# Patient Record
Sex: Female | Born: 1965 | Race: Black or African American | Hispanic: No | Marital: Married | Smoking: Former smoker
Health system: Southern US, Community
[De-identification: ages and names within clinical notes are randomized; demographics above are authoritative.]

## PROBLEM LIST (undated history)

## (undated) DIAGNOSIS — N390 Urinary tract infection, site not specified: Secondary | ICD-10-CM

## (undated) DIAGNOSIS — J329 Chronic sinusitis, unspecified: Secondary | ICD-10-CM

## (undated) DIAGNOSIS — T7840XA Allergy, unspecified, initial encounter: Secondary | ICD-10-CM

## (undated) DIAGNOSIS — F329 Major depressive disorder, single episode, unspecified: Secondary | ICD-10-CM

## (undated) DIAGNOSIS — K449 Diaphragmatic hernia without obstruction or gangrene: Secondary | ICD-10-CM

## (undated) DIAGNOSIS — G8929 Other chronic pain: Secondary | ICD-10-CM

## (undated) DIAGNOSIS — L4050 Arthropathic psoriasis, unspecified: Secondary | ICD-10-CM

## (undated) DIAGNOSIS — R011 Cardiac murmur, unspecified: Secondary | ICD-10-CM

## (undated) DIAGNOSIS — N62 Hypertrophy of breast: Secondary | ICD-10-CM

## (undated) DIAGNOSIS — A630 Anogenital (venereal) warts: Secondary | ICD-10-CM

## (undated) DIAGNOSIS — N189 Chronic kidney disease, unspecified: Secondary | ICD-10-CM

## (undated) DIAGNOSIS — U071 COVID-19: Secondary | ICD-10-CM

## (undated) DIAGNOSIS — F32A Depression, unspecified: Secondary | ICD-10-CM

## (undated) DIAGNOSIS — B029 Zoster without complications: Secondary | ICD-10-CM

## (undated) DIAGNOSIS — F419 Anxiety disorder, unspecified: Secondary | ICD-10-CM

## (undated) HISTORY — DX: COVID-19: U07.1

## (undated) HISTORY — DX: Chronic kidney disease, unspecified: N18.9

## (undated) HISTORY — DX: Arthropathic psoriasis, unspecified: L40.50

## (undated) HISTORY — DX: Anogenital (venereal) warts: A63.0

## (undated) HISTORY — DX: Depression, unspecified: F32.A

## (undated) HISTORY — DX: Diaphragmatic hernia without obstruction or gangrene: K44.9

## (undated) HISTORY — DX: Chronic sinusitis, unspecified: J32.9

## (undated) HISTORY — DX: Urinary tract infection, site not specified: N39.0

## (undated) HISTORY — DX: Allergy, unspecified, initial encounter: T78.40XA

## (undated) HISTORY — DX: Zoster without complications: B02.9

## (undated) HISTORY — DX: Major depressive disorder, single episode, unspecified: F32.9

## (undated) HISTORY — DX: Other chronic pain: G89.29

## (undated) HISTORY — PX: GASTRIC BYPASS: SHX52

## (undated) HISTORY — DX: Cardiac murmur, unspecified: R01.1

## (undated) HISTORY — PX: REDUCTION MAMMAPLASTY: SUR839

---

## 1995-09-02 HISTORY — PX: TUBAL LIGATION: SHX77

## 2006-09-01 HISTORY — PX: LAPAROSCOPIC GASTRIC BANDING: SHX1100

## 2007-08-17 ENCOUNTER — Encounter: Payer: Self-pay | Admitting: Family Medicine

## 2007-09-02 HISTORY — PX: CHOLECYSTECTOMY: SHX55

## 2008-04-25 ENCOUNTER — Ambulatory Visit: Payer: Self-pay | Admitting: Obstetrics & Gynecology

## 2008-04-25 ENCOUNTER — Encounter: Payer: Self-pay | Admitting: Obstetrics & Gynecology

## 2008-05-17 ENCOUNTER — Ambulatory Visit: Payer: Self-pay | Admitting: Family Medicine

## 2008-05-17 DIAGNOSIS — G43109 Migraine with aura, not intractable, without status migrainosus: Secondary | ICD-10-CM | POA: Insufficient documentation

## 2008-05-17 DIAGNOSIS — F4323 Adjustment disorder with mixed anxiety and depressed mood: Secondary | ICD-10-CM | POA: Insufficient documentation

## 2008-05-17 DIAGNOSIS — S6390XA Sprain of unspecified part of unspecified wrist and hand, initial encounter: Secondary | ICD-10-CM | POA: Insufficient documentation

## 2008-07-12 ENCOUNTER — Ambulatory Visit: Payer: Self-pay | Admitting: Family Medicine

## 2008-07-12 LAB — CONVERTED CEMR LAB: Rapid Strep: NEGATIVE

## 2009-06-01 ENCOUNTER — Ambulatory Visit: Payer: Self-pay | Admitting: Family Medicine

## 2009-06-04 ENCOUNTER — Telehealth: Payer: Self-pay | Admitting: Family Medicine

## 2009-06-20 ENCOUNTER — Ambulatory Visit: Payer: Self-pay | Admitting: Family Medicine

## 2009-08-14 ENCOUNTER — Ambulatory Visit: Payer: Self-pay | Admitting: Family Medicine

## 2009-08-14 ENCOUNTER — Telehealth (INDEPENDENT_AMBULATORY_CARE_PROVIDER_SITE_OTHER): Payer: Self-pay | Admitting: *Deleted

## 2009-09-17 ENCOUNTER — Ambulatory Visit: Payer: Self-pay | Admitting: Family Medicine

## 2009-09-17 ENCOUNTER — Other Ambulatory Visit: Admission: RE | Admit: 2009-09-17 | Discharge: 2009-09-17 | Payer: Self-pay | Admitting: Family Medicine

## 2009-09-17 DIAGNOSIS — R5381 Other malaise: Secondary | ICD-10-CM | POA: Insufficient documentation

## 2009-09-17 DIAGNOSIS — R5383 Other fatigue: Secondary | ICD-10-CM

## 2009-09-18 ENCOUNTER — Ambulatory Visit: Payer: Self-pay | Admitting: Family Medicine

## 2009-09-18 ENCOUNTER — Encounter: Payer: Self-pay | Admitting: Family Medicine

## 2009-09-19 LAB — CONVERTED CEMR LAB
Albumin: 3.8 g/dL (ref 3.5–5.2)
Basophils Absolute: 0 10*3/uL (ref 0.0–0.1)
Basophils Relative: 0.2 % (ref 0.0–3.0)
CO2: 24 meq/L (ref 19–32)
Chloride: 109 meq/L (ref 96–112)
Creatinine, Ser: 0.9 mg/dL (ref 0.4–1.2)
Eosinophils Absolute: 0.2 10*3/uL (ref 0.0–0.7)
Folate: 5 ng/mL
Glucose, Bld: 84 mg/dL (ref 70–99)
Hemoglobin: 11.8 g/dL — ABNORMAL LOW (ref 12.0–15.0)
MCHC: 32 g/dL (ref 30.0–36.0)
MCV: 85.9 fL (ref 78.0–100.0)
Monocytes Absolute: 0.5 10*3/uL (ref 0.1–1.0)
Neutro Abs: 6.6 10*3/uL (ref 1.4–7.7)
RBC: 4.31 M/uL (ref 3.87–5.11)
RDW: 12.7 % (ref 11.5–14.6)
TSH: 1.03 microintl units/mL (ref 0.35–5.50)
Total Protein: 6.4 g/dL (ref 6.0–8.3)
Vitamin B-12: 372 pg/mL (ref 211–911)

## 2009-12-19 ENCOUNTER — Telehealth: Payer: Self-pay | Admitting: Family Medicine

## 2009-12-20 ENCOUNTER — Ambulatory Visit: Payer: Self-pay | Admitting: Family Medicine

## 2009-12-20 DIAGNOSIS — B353 Tinea pedis: Secondary | ICD-10-CM | POA: Insufficient documentation

## 2010-01-03 ENCOUNTER — Ambulatory Visit: Payer: Self-pay | Admitting: Family Medicine

## 2010-01-07 LAB — CONVERTED CEMR LAB
ALT: 13 units/L (ref 0–35)
AST: 21 units/L (ref 0–37)
Alkaline Phosphatase: 60 units/L (ref 39–117)
Bilirubin, Direct: 0 mg/dL (ref 0.0–0.3)
Total Bilirubin: 0.3 mg/dL (ref 0.3–1.2)

## 2010-01-18 ENCOUNTER — Ambulatory Visit: Payer: Self-pay | Admitting: Family Medicine

## 2010-03-15 ENCOUNTER — Ambulatory Visit: Payer: Self-pay | Admitting: Family Medicine

## 2010-03-15 DIAGNOSIS — J309 Allergic rhinitis, unspecified: Secondary | ICD-10-CM | POA: Insufficient documentation

## 2010-09-03 ENCOUNTER — Ambulatory Visit
Admission: RE | Admit: 2010-09-03 | Discharge: 2010-09-03 | Payer: Self-pay | Source: Home / Self Care | Attending: Internal Medicine | Admitting: Internal Medicine

## 2010-09-03 DIAGNOSIS — E049 Nontoxic goiter, unspecified: Secondary | ICD-10-CM | POA: Insufficient documentation

## 2010-09-27 ENCOUNTER — Encounter: Payer: Self-pay | Admitting: Family Medicine

## 2010-09-27 ENCOUNTER — Ambulatory Visit
Admission: RE | Admit: 2010-09-27 | Discharge: 2010-09-27 | Payer: Self-pay | Source: Home / Self Care | Attending: Family Medicine | Admitting: Family Medicine

## 2010-09-27 DIAGNOSIS — T7802XA Anaphylactic reaction due to shellfish (crustaceans), initial encounter: Secondary | ICD-10-CM | POA: Insufficient documentation

## 2010-09-27 DIAGNOSIS — J019 Acute sinusitis, unspecified: Secondary | ICD-10-CM | POA: Insufficient documentation

## 2010-10-01 NOTE — Assessment & Plan Note (Signed)
Summary: SORE THROAT/HEADACHE/CONGESTION/RBH   Vital Signs:  Patient profile:   45 year old female Height:      63 inches Weight:      244 pounds BMI:     43.38 Temp:     98.3 degrees F oral Pulse rate:   68 / minute Pulse rhythm:   regular BP sitting:   130 / 82  (left arm) Cuff size:   regular  Vitals Entered By: Linde Gillis CMA Duncan Dull) (Jan 18, 2010 9:39 AM) CC: sore throat, headache, congestion   History of Present Illness: 45 yo with 1 1/2 week of worsening sinus congestion, sore throat. Started out with runny nose, felt a little better a few days later. Last several days, felt feverish with increased sinus pressure. No cough. No SOB, no wheezing. No n/v/d. Taking mucinex OTC with no relief of symptoms.  Current Medications (verified): 1)  Multivitamins   Tabs (Multiple Vitamin) .... Take 1 Tablet By Mouth Once A Day 2)  Allegra-D 24 Hour 180-240 Mg Xr24h-Tab (Fexofenadine-Pseudoephedrine) .[name].. 1 Tab By Mouth Daily. 3)  Azithromycin 250 Mg  Tabs (Azithromycin) .... 2 By  Mouth Today and Then 1 Daily For 4 Days  Allergies (verified): No Known Drug Allergies  Review of Systems      See HPI General:  Complains of chills and fever. ENT:  Complains of nasal congestion, sinus pressure, and sore throat. Resp:  Denies cough, sputum productive, and wheezing. GI:  Denies diarrhea, nausea, and vomiting.  Physical Exam  General:  obese appearing female in NAD, very pleasant. Ears:  TMs retracted bilaterally. Nose:  nasal discharge,mucosal pallor. Frontal sinuses TTP. Mouth:  pharyngeal erythema.   no exudates Lungs:  Normal respiratory effort, chest expands symmetrically. Lungs are clear to auscultation, no crackles or wheezes. Heart:  Normal rate and regular rhythm. S1 and S2 normal without gallop, murmur, click, rub or other extra sounds. Extremities:  no edema Psych:  talkative, pleasant, normal affect   Impression & Recommendations:  Problem # 1:  SINUSITIS,  ACUTE (ICD-461.9) Assessment New Given duration and progression of symptoms, will treat for bacterial sinusitis. See pt instructions for details. Her updated medication list for this problem includes:    Allegra-d 24 Hour 180-240 Mg Xr24h-tab (Fexofenadine-pseudoephedrine) .[name]... 1 tab by mouth daily.    Azithromycin 250 Mg Tabs (Azithromycin) .[name]... 2 by  mouth today and then 1 daily for 4 days  Complete Medication List: 1)  Multivitamins Tabs (Multiple vitamin) .... Take 1 tablet by mouth once a day 2)  Allegra-d 24 Hour 180-240 Mg Xr24h-tab (Fexofenadine-pseudoephedrine) .[name].. 1 tab by mouth daily. 3)  Azithromycin 250 Mg Tabs (Azithromycin) .... 2 by  mouth today and then 1 daily for 4 days  Patient Instructions: 1)  Take Zpack as directed.  Drink lots of fluids.  Treat sympotmatically with Mucinex, nasal saline irrigation, and Tylenol/Ibuprofen. Also try claritin D or zyrtec D over the counter- two times a day as needed ( have to sign for them at pharmacy). You can use warm compresses.  Cough suppressant at night. Call if not improving as expected in 5-7 days.  Prescriptions: AZITHROMYCIN 250 MG  TABS (AZITHROMYCIN) 2 by  mouth today and then 1 daily for 4 days  #6 x 0   Entered and Authorized by:   Ruthe Mannan MD   Signed by:   Ruthe Mannan MD on 01/18/2010   Method used:   Electronically to        [facility]. [facility]. #  [id number]* (retail)       [address] [place]  [address]       Ph: [phone]       Fax: [phone]   RxID:   [id number]   Current Allergies (reviewed today): No known allergies

## 2010-10-01 NOTE — Assessment & Plan Note (Signed)
Summary: CPX/CLE   Vital Signs:  Patient profile:   45 year old female LMP:     09/03/2009 Weight:      260.25 pounds BMI:     46.27 Temp:     98.4 degrees F oral Pulse rate:   88 / minute Pulse rhythm:   regular BP sitting:   122 / 76  (left arm) Cuff size:   large  Vitals Entered By: Delilah Shan CMA [facility] Dull) (September 17, 2009 9:10 AM) CC: CPX LMP (date): 09/03/2009     Enter LMP: 09/03/2009   History of Present Illness: 45 yo here for CPX with complaint of fatigue.  Faitigue- noticed it since the holidays were over. She's not sure if she's just finally having time to rest and that's why she is tired or if something else is going on.  No hot/cold intolerance, no weight changes, no night sweats, no fevers.  No SOB, no chest pain.  Denies any signs or symptoms of depression or anxiety.  Younger son died years ago and she went to counselling for depression at that time but is much better now.   NO DOE.  Well woman- G2P2 s/p BTL.  No h/o abnormal pap smears.  No vaginal discharge.  Due for mammogram, FLP.  No FH of cervical, breast, or uterine CA.  No FH of HLD or CAD.  Current Medications (verified): 1)  Multivitamins   Tabs (Multiple Vitamin) .... Take 1 Tablet By Mouth Once A Day  Allergies (verified): No Known Drug Allergies  Family History: Reviewed history from 05/17/2008 and no changes required. Family History Lung cancer Father, d/c HIV Mom - HTN, chol  2 brothers  1 son  Social History: Reviewed history from 05/17/2008 and no changes required. 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 Eyes:  Denies blurring. ENT:  Denies difficulty swallowing. CV:  Denies chest pain or discomfort. Resp:  Denies shortness of breath.  Physical  Exam  General:  obese appearing female in NAD, very pleasant. Ears:  External ear exam shows no significant lesions or deformities.  Otoscopic examination reveals clear canals, tympanic membranes are intact bilaterally without bulging, retraction, inflammation or discharge. Hearing is grossly normal bilaterally. Mouth:  Oral mucosa and oropharynx without lesions or exudates.  Teeth in good repair. Breasts:  No mass, nodules, thickening, tenderness, bulging, retraction, inflamation, nipple discharge or skin changes noted.   Lungs:  Normal respiratory effort, chest expands symmetrically. Lungs are clear to auscultation, no crackles or wheezes. Heart:  Normal rate and regular rhythm. S1 and S2 normal without gallop, murmur, click, rub or other extra sounds. Abdomen:  obese, soft, NT Genitalia:  Pelvic Exam:        External: normal female genitalia without lesions or masses        Vagina: normal without lesions or masses        Cervix: normal without lesions or masses        Adnexa: normal bimanual exam without masses or fullness        Uterus: normal by palpation        Pap smear: performed Extremities:  no edema Skin:  Intact without suspicious lesions or rashes Psych:  talkative, pleasant, normal affect   Impression & Recommendations:  Problem # 1:  Preventive Health Care (ICD-V70.0) Reviewed preventive care protocols, scheduled due services, and updated immunizations Discussed nutrition, exercise, diet, and healthy lifestyle.  Pap today, set up mammogram, FLP, BMET today.  Problem # 2:  SCREENING FOR MALIGNANT NEOPLASM, CERVIX (ICD-V76.2)  Orders: Pap Smear, Thin Prep ( Collection of) (L2440)  Problem # 3:  FATIGUE (ICD-780.79) Assessment: New Likely due to life stressors, but will check labs to rule out reversible causes.   No signs or symptoms of cardiac source. Orders: TLB-BMP (Basic Metabolic Panel-BMET) (80048-METABOL) TLB-CBC Platelet - w/Differential  (85025-CBCD) TLB-TSH (Thyroid Stimulating Hormone) (84443-TSH) TLB-B12, Serum-Total ONLY (10272-Z36) TLB-Folic Acid (Folate) (82746-FOL)  Complete Medication List: 1)  Multivitamins Tabs (Multiple vitamin) .... Take 1 tablet by mouth once a day  Other Orders: Venipuncture (64403) TLB-Hepatic/Liver Function Pnl (80076-HEPATIC) TLB-Lipid Panel (80061-LIPID) Radiology Referral (Radiology)  Patient Instructions: 1)  Great to see you. 2)  Please stop by to see Shirlee Limerick on your way out. 3)  Have a good week.  Current Allergies (reviewed today): No known allergies

## 2010-10-01 NOTE — Assessment & Plan Note (Signed)
Summary: ? FOOT FUNGUS/NT   Vital Signs:  Patient profile:   45 year old female Height:      63 inches Weight:      245 pounds BMI:     43.56 Temp:     98.5 degrees F oral Pulse rate:   80 / minute Pulse rhythm:   regular BP sitting:   132 / 84  (left arm) Cuff size:   large  Vitals Entered By: Linde Gillis CMA Duncan Dull) (December 20, 2009 10:48 AM) CC: ? foot fungus   History of Present Illness: 45 yo here for foot fungus.  Noticed since she started exercising more that bottom of feet were burning. Two weeks ago, noticed some red, peeling between her toes and on bottom of feet.  Tried OTC tenactin and Lamisil with no relief of symptoms. Never had anything like this before.   Current Medications (verified): 1)  Multivitamins   Tabs (Multiple Vitamin) .... Take 1 Tablet By Mouth Once A Day 2)  Lipo 6 .... Take One Tablet By Mouth Twice Daily 3)  Terbinafine Hcl 250 Mg Tabs (Terbinafine Hcl) .[name].. 1 Tab By Mouth Daily X 2 Weeks. 4)  Allegra-D 24 Hour 180-240 Mg Xr24h-Tab (Fexofenadine-Pseudoephedrine) .[name].. 1 Tab By Mouth Daily.  Allergies (verified): No Known Drug Allergies  Review of Systems      See HPI General:  Denies fever. GI:  Denies abdominal pain, nausea, and vomiting. Derm:  Complains of dryness, itching, and rash.  Physical Exam  General:  obese appearing female in NAD, very pleasant. Skin:  erythematous, peeling skin in between toes and bottoms of both feet, no open wounds Psych:  talkative, pleasant, normal affect   Impression & Recommendations:  Problem # 1:  DERMATOPHYTOSIS OF FOOT (ICD-110.4) Assessment New s/p failed topical treatments. Given widespread nature and that she has failed topical treatments, will try 2 week course of Terbinafine 250 mg daily. LFTs were normal in 09/2009.  Return in 2 weeks to recheck LFTs and re evaluate feet. Her updated medication list for this problem includes:    Terbinafine Hcl 250 Mg Tabs (Terbinafine hcl) .[name]... 1 tab  by mouth daily x 2 weeks.  Complete Medication List: 1)  Multivitamins Tabs (Multiple vitamin) .... Take 1 tablet by mouth once a day 2)  Lipo 6  .... Take one tablet by mouth twice daily 3)  Terbinafine Hcl 250 Mg Tabs (Terbinafine hcl) .[name].. 1 tab by mouth daily x 2 weeks. 4)  Allegra-d 24 Hour 180-240 Mg Xr24h-tab (Fexofenadine-pseudoephedrine) .[name].. 1 tab by mouth daily.  Patient Instructions: 1)  Great to see you! 2)  Please make a lab appointment on your way out for 2 weeks from now to check your liver function. 3)  Have a great Easter. 4)  I am so proud of you! Prescriptions: ALLEGRA-D 24 HOUR 180-240 MG XR24H-TAB (FEXOFENADINE-PSEUDOEPHEDRINE) 1 tab by mouth daily.  #60 x 3   Entered and Authorized by:   Ruthe Mannan MD   Signed by:   Ruthe Mannan MD on 12/20/2009   Method used:   Electronically to        [facility]. [address] [phone]* (retail)       [address] [place], [place]  [address]       Ph: [phone]       Fax: [phone]   RxID:   [id number] TERBINAFINE HCL 250 MG TABS (TERBINAFINE HCL) 1 tab by mouth daily x 2 weeks.  #  14 x 0   Entered and Authorized by:   Ruthe Mannan MD   Signed by:   Ruthe Mannan MD on 12/20/2009   Method used:   Electronically to        [facility]. [address] [phone]* (retail)       [address] [place]  [address]       Ph: [phone]       Fax: [phone]   RxID:   [id number]   Current Allergies (reviewed today): No known allergies

## 2010-10-01 NOTE — Assessment & Plan Note (Signed)
Summary: SORE THROAT AND HEADACHE CYD   Vital Signs:  Patient profile:   45 year old female Height:      63 inches Weight:      247.25 pounds Temp:     98.5 degrees F oral Pulse rate:   84 / minute Pulse rhythm:   regular BP sitting:   130 / 84  (right arm) Cuff size:   large   Acute Visit History:      The patient complains of earache, headache, nasal discharge, and sore throat.  These symptoms began 2 days ago.  Other comments include: Took some allegra D. .        The earache is located on the right side.        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 Take 1 Tablet By Mouth Once A Day 2)  Allegra-D 24 Hour 180-240 Mg Xr24h-Tab (Fexofenadine-Pseudoephedrine) .[name].. 1 Tab By Mouth Daily.  Allergies (verified): No Known Drug Allergies  Past History:  Past medical, surgical, family and social histories (including risk factors) reviewed, and no changes noted (except as noted below).  Past Medical History: Reviewed history from 05/17/2008 and no changes required. Allergies Depression, took Paxil. Son died 4 years ago. Migraines  Past Surgical History: Reviewed history from 05/17/2008 and no changes required. Cholecystectomy, 2009, [place] Lap Band, 2008 Lap Band Removal, 2009 Tubal ligation, 1997  Family History: Reviewed history from 05/17/2008 and no changes required. Family History Lung cancer Father, d/c HIV Mom - HTN,  chol  2 brothers  1 son  Social History: Reviewed history from 05/17/2008 and no changes required. 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 CV:  Denies chest pain or discomfort. Resp:  Denies shortness of breath.  Physical Exam  General:  Well-developed,well-nourished,in no acute distress; alert,appropriate and cooperative throughout examination Head:  no maxillary sinus ttp Ears:  clear fluid B TMs Nose:  nasal turbinate pallor Mouth:  Oral mucosa and oropharynx without lesions or exudates.  Teeth in good repair. Neck:  no carotid bruit or thyromegaly no cervical or supraclavicular lymphadenopathy  Lungs:  Normal respiratory effort, chest expands symmetrically. Lungs are clear to auscultation, no crackles or wheezes. Heart:  Normal rate and regular rhythm. S1 and S2 normal without gallop, murmur, click, rub or other extra sounds.   Impression & Recommendations:  Problem # 1:  URI (ICD-465.9)  Her updated medication list for this problem includes:    Allegra-d 24 Hour 180-240 Mg Xr24h-tab (Fexofenadine-pseudoephedrine) .[name]... 1 tab by mouth daily.  Instructed on symptomatic treatment. Call if symptoms persist or worsen.   Problem # 2:  ALLERGIC RHINITIS (ICD-477.9) Add nasal steroid.Marland Kitchenwill likely help with fluid in ears and eustacian tube dysfunction.  Her updated medication list for this problem includes:    Fluticasone Propionate 50 Mcg/act Susp (Fluticasone propionate) .Marland KitchenMarland KitchenMarland KitchenMarland Kitchen 2  sprays per nostril daily  Complete Medication List: 1)  Multivitamins Tabs (Multiple vitamin) .... Take 1 tablet by mouth once a day 2)  Allegra-d 24 Hour 180-240 Mg Xr24h-tab (Fexofenadine-pseudoephedrine) .[name].. 1 tab by mouth daily. 3)  Fluticasone Propionate 50 Mcg/act Susp (Fluticasone propionate) .... 2 sprays per nostril daily  Patient Instructions: 1)   Guafenesin two times a day for cough and congestion. 2)  Tylenol for ear pain. 3)   Start nasal steroid spray. 4)   Continue allegra D.  Prescriptions: FLUTICASONE PROPIONATE 50 MCG/ACT SUSP (FLUTICASONE PROPIONATE) 2 sprays per nostril daily  #1 x 0   Entered and Authorized by:   Kerby Nora MD   Signed by:   Kerby Nora MD on 03/15/2010   Method used:   Electronically to        [facility] [address] [phone]* (retail)       [address] [place]  [address]       Ph: [phone]       Fax: [phone]   RxID:   [id number]   Current Allergies (reviewed today): No known allergies

## 2010-10-01 NOTE — Progress Notes (Signed)
Summary: Foot pain  Phone Note Call from Patient Call back at [phone]   Caller: Patient Call For: Dr. Dayton Martes Summary of Call: Patient thinks she may have a foot fungus.  Right foot pain in between her little toe, no redness, no swelling.  This has been going on for about two weeks.  Has used Tenactin spray and no relief.  Uses [facility] Initial call taken by: Linde Gillis CMA Duncan Dull),  December 19, 2009 9:47 AM  Follow-up for Phone Call        We can't prescibe oral antifungals without seeing her first.  Needs to make appt tomorrow or next week. Follow-up by: Ruthe Mannan MD,  December 19, 2009 9:52 AM  Additional Follow-up for Phone Call Additional follow up Details #1::        Patient advised as instructed.  Made her an appt for tomorrow at 10:45.  Additional Follow-up by: Linde Gillis CMA Duncan Dull),  December 19, 2009 10:06 AM

## 2010-10-03 NOTE — Assessment & Plan Note (Signed)
Summary: ST,HA/CLE   Vital Signs:  Patient profile:   45 year old female Weight:      253.25 pounds Temp:     98.7 degrees F oral Pulse rate:   88 / minute Pulse rhythm:   regular BP sitting:   128 / 82  (left arm) Cuff size:   large  Vitals Entered By: Selena Batten Dance CMA (AAMA) (September 03, 2010 9:20 AM) CC: Sore throat, headache, sinus symptoms x1 1/2 weeks  Comments Mucinex, Nyquil and Dayquil no help   History of Present Illness: CC: HA, ST  1 1/2 wk h/o HA as well as ST.  Cough started 3 days ago, productive of yellow sputum.  + hoarse voice throughout.  Very congested in head.  + yellow nasal discharge.  Nauseated x 2 days.  Vomited x 3.  Tried mucinex, dayquil and nyquil, motrin.  HA described as starting behind L eye then radiates to entire head, constant.  sharp.  h/o migraines, this is different from migraines.  Seems more intense than sinus pressure pain.  No fevers/chills, diarrhea, malgias/arthralgias, rashes.  No sick contacts at home.  No smokers at home.  No h/o asthma.  2. thyroid - no h/o thyroid issues, no fmhx thyroid issues (CA or otherwise).  has put on weight recently.   Current Medications (verified): 1)  Multivitamins   Tabs (Multiple Vitamin) .... Take 1 Tablet By Mouth Once A Day 2)  Allegra-D 24 Hour 180-240 Mg Xr24h-Tab (Fexofenadine-Pseudoephedrine) .[name].. 1 Tab By Mouth Daily. 3)  Fluticasone Propionate 50 Mcg/act Susp (Fluticasone Propionate) .... 2 Sprays Per Nostril Daily  Allergies (verified): No Known Drug Allergies  Past History:  Past Medical History: Last updated: 05/17/2008 Allergies Depression, took Paxil. Son died 4 years ago. Migraines  Social History: Last updated: 03/15/2010 Occupation: [employer] office, [employer] Smoker, quit 2005. 1-2 packs, 20 years Alcohol use-no Walk, 4 teimes a week, 47  Review of Systems       per HPI  Physical Exam  General:  Well-developed,well-nourished,in no acute distress; alert,appropriate  and cooperative throughout examination.  nontoxic.  laughs and pleasant Head:  R maxillary sinus tenderness Eyes:  PERRLA, EOMI, no injection Ears:  TMs clear Nose:  nares clear, some swelling Mouth:  MMM, no pharyngeal erythema/edema exudates Neck:  no carotid bruit or thyromegaly no cervical or supraclavicular lymphadenopathy.  + enlarged L thyroid lobe.  some tenderness with palpation of neck Lungs:  Normal respiratory effort, chest expands symmetrically. Lungs are clear to auscultation, no crackles or wheezes. Heart:  Normal rate and regular rhythm. S1 and S2 normal without gallop, murmur, click, rub or other extra sounds. Pulses:  2+ rad pulses, brisk cap refill Extremities:  no pedal edema   Impression & Recommendations:  Problem # 1:  SINUSITIS, ACUTE (ICD-461.9) treat with amox given duration of sxs.  red flags to return discussed.  Her updated medication list for this problem includes:    Allegra-d 24 Hour 180-240 Mg Xr24h-tab (Fexofenadine-pseudoephedrine) .[name]... 1 tab by mouth daily.    Fluticasone Propionate 50 Mcg/act Susp (Fluticasone propionate) .[name]... 2 sprays per nostril daily    Amoxicillin 875 Mg Tabs (Amoxicillin) .[name]... 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  return in 3-4 wks for f/u with PCP.  ?goiter.  normal TSH 09/2009.   Orders: TLB-TSH (Thyroid Stimulating Hormone) (84443-TSH)  Complete Medication List: 1)  Multivitamins Tabs (Multiple vitamin) .... Take 1 tablet by mouth  once a day 2)  Allegra-d 24 Hour 180-240 Mg Xr24h-tab (Fexofenadine-pseudoephedrine) .[name].. 1 tab by mouth daily. 3)  Fluticasone Propionate 50 Mcg/act Susp (Fluticasone propionate) .... 2 sprays per nostril daily 4)  Amoxicillin 875 Mg Tabs (Amoxicillin) .... Take one twice daily for 10 days  Patient Instructions: 1)  Please return in 3-4 wks for follow up with Dr. Patsy Lager or myself of thyroid. 2)  You have a sinus infection. 3)  Take  medicines as prescribed: amoxicillin 875mg  twice daily for 10 days. 4)  Take guaifenesin 400mg  IR 1 1/2 pills in am and at noon with plenty of fluid to help mobilize mucous (or simple mucinex). 5)  Use nasal saline spray or neti pot to help drainage of sinuses. 6)  If you start having fevers >101.5, trouble swallowing or breathing, or are worsening instead of improving as expected, you may need to be seen again. 7)  Good to see you today, call clinic with questions. Prescriptions: AMOXICILLIN 875 MG TABS (AMOXICILLIN) take one twice daily for 10 days  #20 x 0   Entered and Authorized by:   Eustaquio Boyden  MD   Signed by:   Eustaquio Boyden  MD on 09/03/2010   Method used:   Electronically to        [facility] [address] (retail)       [address]       [place], [place]  [address]       Ph: [phone]       Fax: [phone]   RxID:   [id number]    Orders Added: 1)  TLB-TSH (Thyroid Stimulating Hormone) [84443-TSH] 2)  Est. Patient Level III [84132]    Current Allergies (reviewed today): No known allergies

## 2010-10-03 NOTE — Letter (Signed)
Summary: Out of Work  [facility]  [address] [place], [place] [address]   Phone: [phone]  Fax: [phone]    September 27, 2010   Employee:  [employer] [place]    To Whom It May Concern:   For Medical reasons, please excuse the above named employee from work today.  If you need additional information, please feel free to contact our office.         Sincerely,    Crawford Givens MD

## 2010-10-03 NOTE — Assessment & Plan Note (Signed)
Summary: HA X 4 DAYS/CLE   Vital Signs:  Patient profile:   45 year old female Height:      63 inches Weight:      256.75 pounds BMI:     45.65 Temp:     98.2 degrees F oral Pulse rate:   84 / minute Pulse rhythm:   regular BP sitting:   122 / 88  (left arm) Cuff size:   large  Vitals Entered By: Delilah Shan CMA Saddie Sandeen Dull) (September 27, 2010 10:31 AM) CC: H/A x 4 days   History of Present Illness: HA for 4 days.  Was on amoxil for previous sinus infection and she got over that.  She had about 5 good days w/o pain.  Since then the symptoms started back.  Pain over frontal area.  Tooth pain.  Maxillary pain.  Subjective fever.  Sweaty last night.  Occ chills. No cough.  No ear pain.  This doesn't feel like a migraine; exceedrin migraine didn't help.    Allergies (verified): 1)  ! * Shellfish  Social History: Occupation: Forensic scientist, [employer] Smoker, quit 2005. 1-2 packs, 20 years Alcohol use-no Walk, 4 times a week,   Review of Systems       See HPI.  Otherwise negative.    Physical Exam  General:  GEN: nad, alert and oriented HEENT: mucous membranes moist, TM w/o erythema, nasal epithelium injected, OP with cobblestoning NECK: supple w/o LA CV: rrr. PULM: ctab, no inc wob ABD: soft, +bs EXT: no edema  max sinus tender to palpation bilaterally   Impression & Recommendations:  Problem # 1:  SHELLFISH ALLERGY (ICD-988.0) Sent rx for epipen and gave routine instructions.  She needed a new pen.  Orders: Prescription Created Electronically [id number])  Problem # 2:  SINUSITIS - ACUTE-NOS (ICD-461.9) Start augmentin with GI caution and follow up as needed.  Nontoxic.  She agrees.  Supportive tx o/w.  Out of work today.  The following medications were removed from the medication list:    Amoxicillin 875 Mg Tabs (Amoxicillin) .[name]... Take one twice daily for 10 days Her updated medication list for this problem includes:    Allegra-d 24 Hour 180-240 Mg Xr24h-tab  (Fexofenadine-pseudoephedrine) .[name]... 1 tab by mouth daily.    Fluticasone Propionate 50 Mcg/act Susp (Fluticasone propionate) .[name]... 2 sprays per nostril daily    Augmentin 875-125 Mg Tabs (Amoxicillin-pot clavulanate) .[name]... 1 by mouth two times a day  Orders: Prescription Created Electronically [id number])  Complete Medication List: 1)  Multivitamins Tabs (Multiple vitamin) .... Take 1 tablet by mouth once a day 2)  Allegra-d 24 Hour 180-240 Mg Xr24h-tab (Fexofenadine-pseudoephedrine) .[name].. 1 tab by mouth daily. 3)  Fluticasone Propionate 50 Mcg/act Susp (Fluticasone propionate) .... 2 sprays per nostril daily 4)  Fish Oil Oil (Fish oil) .... Once daily 5)  Calcium-magnesium-zinc 333-133-8.3 Mg Tabs (Calcium-magnesium-zinc) .... Once daily 6)  Epipen 2-pak 0.3 Mg/0.79ml Devi (Epinephrine) .... Inject as direct and then go to er 7)  Augmentin 875-125 Mg Tabs (Amoxicillin-pot clavulanate) .[name].. 1 by mouth two times a day  Patient Instructions: 1)  Start the antibiotics today and get some rest.  Use the flonase daily.  Use the epipen as needed and then go to the ER (if you have an allergic reaction). Take care.  Prescriptions: AUGMENTIN 875-125 MG TABS (AMOXICILLIN-POT CLAVULANATE) 1 by mouth two times a day  #20 x 0   Entered and Authorized by:   Crawford Givens MD   Signed by:  Crawford Givens MD on 09/27/2010   Method used:   Electronically to        [facility]. [address]* (retail)       [address]       [place], [place]  [address]       Ph: [phone]       Fax: [phone]   RxID:   [id number] EPIPEN 2-PAK 0.3 MG/0.3ML DEVI (EPINEPHRINE) inject as direct and then go to ER  #1 pack x 1   Entered and Authorized by:   Crawford Givens MD   Signed by:   Crawford Givens MD on 09/27/2010   Method used:   Electronically to        [facility] [address] (retail)       [address]       [place], [place]  [address]       Ph: [phone]       Fax: [phone]   RxID:    [id number]    Orders Added: 1)  Prescription Created Electronically [[id number]] 2)  Est. Patient Level III [88416]    Current Allergies (reviewed today): ! * SHELLFISH

## 2011-01-14 NOTE — Assessment & Plan Note (Signed)
NAME:  Barbara Faulkner, Barbara Faulkner                ACCOUNT NO.:  [id number]   MEDICAL RECORD NO.:  [id number]          PATIENT TYPE:  POB   LOCATION:  [facility] at [facility]         FACILITY[facility]  [facility]   PHYSICIAN:  Johnella Moloney, MD        DATE OF BIRTH:  1965/12/29   DATE OF SERVICE:  04/25/2008                                  CLINIC NOTE   CHIEF COMPLAINT:  Annual exam, followup Pap smear from a prior abnormal  Pap.   HISTORY OF PRESENT ILLNESS:  The patient is a 45 year old gravida 2,  para 2 with last menstrual period of March 24, 2008, who is here to have  an annual exam and initiate gynecologic care.  The patient recently  moved to the area and reports that she had and abnormal Pap smear in  October 2008.  She was supposed to follow up 6 months after the Pap  smear, but due to extenuating circumstances was unable to make this  appointment.  She is here today requesting a followup Pap smear and an  annual examination.  The patient denies any gynecologic concerns.   PAST OB/GYN HISTORY:  The patient has had 2 vaginal deliveries.  She had  menarche at age 42.  She has regular cycles with 28 days between cycles  and her periods last 8-10 days with heavy flow and moderate pain.  She  denies any bleeding in between periods.  The patient has a tubal  ligation for contraception.  She is sexually active with her husband and  has no problems.  Her last Pap smear was in October 2008, and was  abnormal.  The patient is not sure of the abnormality, but does remember  she was only told to have a repeat Pap smear.  The patient's last  mammogram was in 2007, which was normal.  She had this mammogram for  some breast tenderness.  She has no current breast symptoms at this  point.  The patient denies any history of sexually transmitted  infections.   PAST MEDICAL HISTORY:  1. Chickenpox as a child.  2. Depression.  3. Frequent headaches.  4. Hay fever and allergies.  5. Migraines.   PAST SURGICAL HISTORY:  1. Tubal ligation in 1993.  2. Lap band surgery in 2008.  3. Cholecystectomy in March 2009.  4. A lab band removal in April 2009.   MEDICATIONS:  Multivitamins.   ALLERGIES:  SHELLFISH.  The patient is not allergic to latex.   SOCIAL HISTORY:  The patient lives with her husband.  She currently  works as a Research scientist (physical sciences).  She denies any alcohol, tobacco, or illicit  drug use.  She denies any past or current history of sexual or physical  abuse.   FAMILY HISTORY:  Notable for a maternal grandfather with lung cancer.  She denies any breast, gynecological, or colon cancer history.   REVIEW OF SYSTEMS:  The patient endorses having a weight gain of 30  pounds within the last 4 months.  She does attribute this to excessive  eating.  She is currently walking every day and will be joining a  gymnasium soon to help with her weight  loss efforts.  The patient also  endorses rare hot flashes about 1-2 a day, especially at night.   PHYSICAL EXAMINATION:  VITAL SIGNS:  Blood pressure 120/84, pulse 80,  weight 224 pounds, and height 5 feet and 3 inches.  GENERAL:  No apparent distress.  HEAD, EARS, EYES, NOSE, AND THROAT:  Normocephalic and atraumatic.  NECK:  Supple.  No masses.  Normal thyroid.  BREASTS:  Pendulous and symmetric in size.  No abnormal masses,  drainage, skin changes, or lymphadenopathy.  LUNGS:  Clear to auscultation bilaterally.  HEART:  Regular rate and rhythm.  ABDOMEN:  Soft, obese, and nontender.  No organomegaly.  EXTREMITIES:  No cyanosis, clubbing, or edema.  PELVIC:  Normal external female genitalia.  On speculum examination, a  pink and well-rugated vagina.  No abnormal lesions or discharge noted.  Multiparous cervical os noted.  Pap smear sample obtained.  On bimanual  exam, unable to palpate uterus or adnexa due to habitus, but no  tenderness elicited on examination.   ASSESSMENT AND PLAN:  The patient is a 45 year old gravida 2, para 2  here to initiate  gynecologic care.  We will follow up the patient's Pap  smear and the patient was also told to get records from her prior  N[facility] Noone in order to better manage the abnormal Pap smear.  She might  need a repeat Pap smear in the next 6 months to a year depending on the  abnormality.  Also if this Pap smear is abnormal, the patient will need  a colposcopy or other appropriate followup.  The patient has no other  gynecologic concerns and she was commended on her weight loss efforts  and was told to follow up in the GYN clinic for any further gynecologic  issues.           ______________________________  Johnella Moloney, MD     UD/MEDQ  D:  04/25/2008  T:  04/26/2008  Job:  [id number]

## 2011-04-16 ENCOUNTER — Encounter: Payer: Self-pay | Admitting: Family Medicine

## 2011-04-22 ENCOUNTER — Encounter: Payer: Self-pay | Admitting: Family Medicine

## 2011-04-22 LAB — HM PAP SMEAR

## 2011-04-23 ENCOUNTER — Other Ambulatory Visit (HOSPITAL_COMMUNITY)
Admission: RE | Admit: 2011-04-23 | Discharge: 2011-04-23 | Disposition: A | Discharge status: Home / Self Care | Payer: Federal, State, Local not specified - PPO | Source: Ambulatory Visit | Attending: Family Medicine | Admitting: Family Medicine

## 2011-04-23 ENCOUNTER — Encounter: Payer: Self-pay | Admitting: Family Medicine

## 2011-04-23 ENCOUNTER — Ambulatory Visit (INDEPENDENT_AMBULATORY_CARE_PROVIDER_SITE_OTHER): Payer: Federal, State, Local not specified - PPO | Admitting: Family Medicine

## 2011-04-23 VITALS — BP 120/82 | HR 88 | Temp 98.1°F | Ht 63.0 in | Wt 228.5 lb

## 2011-04-23 DIAGNOSIS — R87810 Cervical high risk human papillomavirus (HPV) DNA test positive: Secondary | ICD-10-CM | POA: Insufficient documentation

## 2011-04-23 DIAGNOSIS — Z0000 Encounter for general adult medical examination without abnormal findings: Secondary | ICD-10-CM

## 2011-04-23 DIAGNOSIS — Z136 Encounter for screening for cardiovascular disorders: Secondary | ICD-10-CM

## 2011-04-23 DIAGNOSIS — N920 Excessive and frequent menstruation with regular cycle: Secondary | ICD-10-CM | POA: Insufficient documentation

## 2011-04-23 DIAGNOSIS — Z124 Encounter for screening for malignant neoplasm of cervix: Secondary | ICD-10-CM | POA: Insufficient documentation

## 2011-04-23 DIAGNOSIS — Z1231 Encounter for screening mammogram for malignant neoplasm of breast: Secondary | ICD-10-CM

## 2011-04-23 DIAGNOSIS — Z23 Encounter for immunization: Secondary | ICD-10-CM

## 2011-04-23 LAB — CBC WITH DIFFERENTIAL/PLATELET
Eosinophils Relative: 2.1 % (ref 0.0–5.0)
HCT: 39 % (ref 36.0–46.0)
Lymphs Abs: 2.6 10*3/uL (ref 0.7–4.0)
MCV: 84.1 fl (ref 78.0–100.0)
Monocytes Absolute: 0.5 10*3/uL (ref 0.1–1.0)
Neutro Abs: 6 10*3/uL (ref 1.4–7.7)
Platelets: 202 10*3/uL (ref 150.0–400.0)
RDW: 13.2 % (ref 11.5–14.6)

## 2011-04-23 LAB — BASIC METABOLIC PANEL
Calcium: 9.2 mg/dL (ref 8.4–10.5)
Creatinine, Ser: 1.1 mg/dL (ref 0.4–1.2)
GFR: 71.25 mL/min (ref >60.00)
Glucose, Bld: 103 mg/dL — ABNORMAL HIGH (ref 70–99)
Sodium: 141 mEq/L (ref 135–145)

## 2011-04-23 LAB — LIPID PANEL
HDL: 35.1 mg/dL — ABNORMAL LOW (ref >39.00)
Triglycerides: 80 mg/dL (ref 0.0–149.0)

## 2011-04-23 NOTE — Patient Instructions (Signed)
Great to see you. Please stop by to see Marion on your way out to set up your mammogram.    

## 2011-04-23 NOTE — Progress Notes (Signed)
45 yo here for CPX. Well woman- G2P2 s/p BTL. No h/o abnormal pap smears. Last pap smear was normal 09/2009.   No vaginal discharge. Due for mammogram, Tdap, FLP. No FH of cervical, breast, or uterine CA. No FH of HLD or CAD.   Has had some irregular menses last 4 or 5 months- some months, she is bleeding heavier- last month, she bled for 10 days. Other months, she is having two periods per month. Denies any fatigue or dizziness.  Renewing her vows in [place] for 10 years!  Patient Active Problem List  Diagnoses  . DERMATOPHYTOSIS OF FOOT  . MORBID OBESITY  . DEPRESSION  . MIGRAINE W/AURA W/O INTRACT W/O STATUS MIGRNOSUS  . ALLERGIC RHINITIS  . FATIGUE  . FINGER SPRAIN  . THYROMEGALY  . SINUSITIS - ACUTE-NOS  . SHELLFISH ALLERGY   Past Medical History  Diagnosis Date  . Allergy   . Depression   . Migraine    Past Surgical History  Procedure Date  . Cholecystectomy 2009  . Tubal ligation 1997  . Laparoscopic gastric banding 2008 & 2009   History  Substance Use Topics  . Smoking status: Former Smoker -- 2.0 packs/day for 20 years    Types: Cigarettes    Quit date: 09/02/2003  . Smokeless tobacco: Not on file  . Alcohol Use: No   Family History  Problem Relation Age of Onset  . Hypertension Mother   . Hyperlipidemia Mother   . HIV Father    Allergies  Allergen Reactions  . Shellfish Allergy     REACTION: throat closing   Current Outpatient Prescriptions on File Prior to Visit  Medication Sig Dispense Refill  . CALCIUM-MAGNESUIUM-ZINC 333-133-8.3 MG TABS Take 1 tablet by mouth daily.        [name] EPINEPHrine (EPIPEN 2-PAK) 0.3 mg/0.3 mL DEVI Inject as directed and then go to ER       . fexofenadine-pseudoephedrine (ALLEGRA-D 24) 180-240 MG per 24 hr tablet Take 1 tablet by mouth daily.        . fluticasone (FLONASE) 50 MCG/ACT nasal spray Place 2 sprays into the nose daily.        . Multiple Vitamin (MULTIVITAMIN) tablet Take 1 tablet by mouth daily.         . Omega-3 Fatty Acids (FISH OIL) 1000 MG CAPS Take 1 capsule by mouth daily.         The PMH, PSH, Social History, Family History, Medications, and allergies have been reviewed in [facility], and have been updated if relevant.    Review of Systems  See HPI  Patient reports no  vision/ hearing changes,anorexia, weight change, fever ,adenopathy, persistant / recurrent hoarseness, swallowing issues, chest pain, edema,persistant / recurrent cough, hemoptysis, dyspnea(rest, exertional, paroxysmal nocturnal), gastrointestinal  bleeding (melena, rectal bleeding), abdominal pain, excessive heart burn, GU symptoms(dysuria, hematuria, pyuria, voiding/incontinence  Issues) syncope, focal weakness, severe memory loss, concerning skin lesions, depression, anxiety, abnormal bruising/bleeding, major joint swelling, breast masses or abnormal vaginal bleeding.    Physical exam: BP 120/82  Pulse 88  Temp(Src) 98.1 F (36.7 C) (Oral)  Ht 5\' 3"  (1.6 m)  Wt 228 lb 8 oz (103.647 kg)  BMI 40.48 kg/m2  LMP 04/06/2011  General:  Well-developed,well-nourished,in no acute distress; alert,appropriate and cooperative throughout examination Head:  normocephalic and atraumatic.   Eyes:  vision grossly intact, pupils equal, pupils round, and pupils reactive to light.   Ears:  R ear normal and L ear normal.   Nose:  no external deformity.   Mouth:  good dentition.   Neck:  No deformities, masses, or tenderness noted. Breasts:  No mass, nodules, thickening, tenderness, bulging, retraction, inflamation, nipple discharge or skin changes noted.   Lungs:  Normal respiratory effort, chest expands symmetrically. Lungs are clear to auscultation, no crackles or wheezes. Heart:  Normal rate and regular rhythm. S1 and S2 normal without gallop, murmur, click, rub or other extra sounds. Abdomen:  Bowel sounds positive,abdomen soft and non-tender without masses, organomegaly or hernias noted. Rectal:  no external abnormalities.     Genitalia:  Pelvic Exam:        External: normal female genitalia without lesions or masses        Vagina: normal without lesions or masses        Cervix: normal without lesions or masses        Adnexa: normal bimanual exam without masses or fullness        Uterus: normal by palpation        Pap smear: performed Msk:  No deformity or scoliosis noted of thoracic or lumbar spine.   Extremities:  No clubbing, cyanosis, edema, or deformity noted with normal full range of motion of all joints.   Neurologic:  alert & oriented X3 and gait normal.   Skin:  Intact without suspicious lesions or rashes Cervical Nodes:  No lymphadenopathy noted Axillary Nodes:  No palpable lymphadenopathy Psych:  Cognition and judgment appear intact. Alert and cooperative with normal attention span and concentration. No apparent delusions, illusions, hallucinations  Assessment and Plan: 1. Routine general medical examination at a health care facility  Reviewed preventive care protocols, scheduled due services, and updated immunizations Discussed nutrition, exercise, diet, and healthy lifestyle.  Basic Metabolic Panel (BMET), Cytology -Pap Smear Tdap  2. Menorrhagia  CBC w/Diff

## 2011-04-28 ENCOUNTER — Telehealth: Payer: Self-pay | Admitting: *Deleted

## 2011-04-28 NOTE — Telephone Encounter (Signed)
Patient stated that she saw you last week and is still constipated. Patient stated that she has tried OTC medication that has not helped. Patient wants to know what you recommend that she do for this?

## 2011-04-28 NOTE — Telephone Encounter (Signed)
What did she try OTC? Almost all medication for constipation is OTC. I would try Miralax first. If that doesn't work, senna and or milk of magnesia.

## 2011-04-28 NOTE — Telephone Encounter (Signed)
Patient stated that she has been using Vear Clock and gave herself and enema with no relief.  I spoke with Dr. Dayton Martes and she advised that patient can take Miralax, Senna, and Milk of Magnesia.

## 2011-04-28 NOTE — Telephone Encounter (Signed)
Patient will call me tomorrow with an update.

## 2011-05-08 NOTE — Telephone Encounter (Signed)
Left message on cell phone voicemail for patient to return call. 

## 2011-05-09 NOTE — Telephone Encounter (Signed)
Spoke with patient and she is doing fine.  She stated that someone told her to cut up a lemon and put it in a gallon of water and drink it to relieve the constipation and she tried it and it worked.

## 2011-06-04 ENCOUNTER — Ambulatory Visit: Payer: Self-pay | Admitting: Family Medicine

## 2011-06-06 ENCOUNTER — Encounter: Payer: Self-pay | Admitting: *Deleted

## 2011-06-10 ENCOUNTER — Encounter: Payer: Self-pay | Admitting: Family Medicine

## 2011-08-04 ENCOUNTER — Telehealth: Payer: Self-pay | Admitting: *Deleted

## 2011-08-04 DIAGNOSIS — F329 Major depressive disorder, single episode, unspecified: Secondary | ICD-10-CM

## 2011-08-04 NOTE — Telephone Encounter (Signed)
Patient called and wanted to let Dr. Dayton Martes know that she saw her therapist recently and it was suggested that patient get back on some type of antidepressant medication.  Patient stated that she was on Paxil in the past (3 years ago) and it worked well for her.  She would like a Rx for Paxil sent to [facility] if Dr. Dayton Martes thinks this is ok.  Patient is also requesting Dr. Elmer Sow approval on her seeing Dr. Charlynn Grimes here in our office.  Please advise.

## 2011-08-05 MED ORDER — PAROXETINE HCL 20 MG PO TABS: 20.0000 mg | ORAL_TABLET | ORAL | Status: DC

## 2011-08-05 NOTE — Telephone Encounter (Signed)
Referral placed.

## 2011-08-05 NOTE — Telephone Encounter (Signed)
Patient advised as instructed via telephone.  She will call back to schedule one month follow up.  She would like Shirlee Limerick to schedule an appt for her with Dr. Laymond Purser.  She will wait to hear from [facility].

## 2011-08-05 NOTE — Telephone Encounter (Signed)
I like Dr. Laymond Purser very much. Paxil rx sent to [facility]. Please have her follow up with me in one month and please do not stop this medication without calling us first as it can cause withdrawal symptoms.

## 2011-08-21 ENCOUNTER — Ambulatory Visit (INDEPENDENT_AMBULATORY_CARE_PROVIDER_SITE_OTHER): Payer: Federal, State, Local not specified - PPO | Admitting: Family Medicine

## 2011-08-21 ENCOUNTER — Encounter: Payer: Self-pay | Admitting: Family Medicine

## 2011-08-21 VITALS — BP 120/70 | HR 86 | Temp 98.7°F | Ht 63.0 in | Wt 227.5 lb

## 2011-08-21 DIAGNOSIS — J069 Acute upper respiratory infection, unspecified: Secondary | ICD-10-CM

## 2011-08-21 NOTE — Progress Notes (Signed)
SUBJECTIVE:  Barbara Faulkner is a 45 y.o. female who complains of coryza, congestion, sneezing, myalgias and fever for 5 days. She denies a history of chest pain, shortness of breath, vomiting, weakness and wheezing and denies a history of asthma. Patient denies smoke cigarettes.   Patient Active Problem List  Diagnoses  . DERMATOPHYTOSIS OF FOOT  . MORBID OBESITY  . DEPRESSION  . MIGRAINE W/AURA W/O INTRACT W/O STATUS MIGRNOSUS  . ALLERGIC RHINITIS  . FATIGUE  . FINGER SPRAIN  . THYROMEGALY  . SINUSITIS - ACUTE-NOS  . SHELLFISH ALLERGY  . Routine general medical examination at a health care facility  . Menorrhagia   Past Medical History  Diagnosis Date  . Allergy   . Depression   . Migraine    Past Surgical History  Procedure Date  . Cholecystectomy 2009  . Tubal ligation 1997  . Laparoscopic gastric banding 2008 & 2009   History  Substance Use Topics  . Smoking status: Former Smoker -- 2.0 packs/day for 20 years    Types: Cigarettes    Quit date: 09/02/2003  . Smokeless tobacco: Not on file  . Alcohol Use: No   Family History  Problem Relation Age of Onset  . Hypertension Mother   . Hyperlipidemia Mother   . HIV Father    Allergies  Allergen Reactions  . Shellfish Allergy     REACTION: throat closing   Current Outpatient Prescriptions on File Prior to Visit  Medication Sig Dispense Refill  . CALCIUM-MAGNESUIUM-ZINC 333-133-8.3 MG TABS Take 1 tablet by mouth daily.        [name] EPINEPHrine (EPIPEN 2-PAK) 0.3 mg/0.3 mL DEVI Inject as directed and then go to ER       . fexofenadine-pseudoephedrine (ALLEGRA-D 24) 180-240 MG per 24 hr tablet Take 1 tablet by mouth daily.        . fluticasone (FLONASE) 50 MCG/ACT nasal spray Place 2 sprays into the nose daily.        . Multiple Vitamin (MULTIVITAMIN) tablet Take 1 tablet by mouth daily.        . Omega-3 Fatty Acids (FISH OIL) 1000 MG CAPS Take 1 capsule by mouth daily.        [name] PARoxetine (PAXIL) 20 MG tablet Take 1  tablet (20 mg total) by mouth every morning.  30 tablet  0   The PMH, PSH, Social History, Family History, Medications, and allergies have been reviewed in [facility], and have been updated if relevant.  OBJECTIVE: BP 120/70  Pulse 86  Temp(Src) 98.7 F (37.1 C) (Oral)  Ht 5\' 3"  (1.6 m)  Wt 227 lb 8 oz (103.193 kg)  BMI 40.30 kg/m2  She appears well, vital signs are as noted. Ears normal.  Throat and pharynx normal.  Neck supple. No adenopathy in the neck. Nose is congested. Sinuses non tender. The chest is clear, without wheezes or rales.  ASSESSMENT:  viral upper respiratory illness  PLAN: Symptomatic therapy suggested: push fluids, rest and return office visit prn if symptoms persist or worsen. Lack of antibiotic effectiveness discussed with her. Call or return to clinic prn if these symptoms worsen or fail to improve as anticipated.

## 2011-08-21 NOTE — Patient Instructions (Signed)
Have a wonderful Christmas, [place]. Enjoy that puppy!!  He's so lucky to have you. This may have been the flu.  Drink lots of fluids.  Treat sympotmatically with Mucinex, nasal saline irrigation, and Tylenol/Ibuprofen.Cough suppressant at night. Call if not improving as expected in 5-7 days.

## 2011-08-25 ENCOUNTER — Telehealth: Payer: Self-pay | Admitting: *Deleted

## 2011-08-25 MED ORDER — HYDROCOD POLST-CHLORPHEN POLST 10-8 MG/5ML PO LQCR: 5.0000 mL | Freq: Two times a day (BID) | ORAL | Status: DC | PRN

## 2011-08-25 NOTE — Telephone Encounter (Signed)
Please phone in rx as entered below. 

## 2011-08-25 NOTE — Telephone Encounter (Signed)
PT seen for flu last week, she refused rx for cough med with codine, because she thought nyquil would help. It is not helping wants to know if rx can be called in.

## 2011-08-25 NOTE — Telephone Encounter (Signed)
Rx called to [facility], patient notified via telephone.

## 2011-08-27 ENCOUNTER — Ambulatory Visit: Payer: Federal, State, Local not specified - PPO | Admitting: Psychology

## 2011-09-04 ENCOUNTER — Other Ambulatory Visit: Payer: Self-pay | Admitting: Family Medicine

## 2011-09-04 ENCOUNTER — Other Ambulatory Visit: Payer: Self-pay | Admitting: *Deleted

## 2011-09-04 MED ORDER — PAROXETINE HCL 20 MG PO TABS: 20.0000 mg | ORAL_TABLET | ORAL | Status: DC

## 2011-09-18 ENCOUNTER — Telehealth: Payer: Self-pay | Admitting: Family Medicine

## 2011-09-18 ENCOUNTER — Encounter: Payer: Self-pay | Admitting: Family Medicine

## 2011-09-18 ENCOUNTER — Ambulatory Visit (INDEPENDENT_AMBULATORY_CARE_PROVIDER_SITE_OTHER): Payer: Federal, State, Local not specified - PPO | Admitting: Family Medicine

## 2011-09-18 VITALS — BP 122/80 | HR 76 | Temp 97.8°F | Wt 236.5 lb

## 2011-09-18 DIAGNOSIS — R5381 Other malaise: Secondary | ICD-10-CM

## 2011-09-18 DIAGNOSIS — R5383 Other fatigue: Secondary | ICD-10-CM | POA: Insufficient documentation

## 2011-09-18 LAB — CBC WITH DIFFERENTIAL/PLATELET
Basophils Relative: 0.3 % (ref 0.0–3.0)
Eosinophils Absolute: 0.2 10*3/uL (ref 0.0–0.7)
Hemoglobin: 11.7 g/dL — ABNORMAL LOW (ref 12.0–15.0)
Lymphocytes Relative: 25.2 % (ref 12.0–46.0)
MCHC: 32.9 g/dL (ref 30.0–36.0)
MCV: 85.3 fl (ref 78.0–100.0)
Monocytes Absolute: 0.7 10*3/uL (ref 0.1–1.0)
Neutro Abs: 7 10*3/uL (ref 1.4–7.7)
RBC: 4.18 Mil/uL (ref 3.87–5.11)

## 2011-09-18 LAB — BASIC METABOLIC PANEL
CO2: 23 mEq/L (ref 19–32)
Chloride: 109 mEq/L (ref 96–112)
Potassium: 4.4 mEq/L (ref 3.5–5.1)
Sodium: 139 mEq/L (ref 135–145)

## 2011-09-18 NOTE — Progress Notes (Signed)
46 yo here for persistent fatigue.  Past 2 weeks, notices that she can sleep 10-16 hours a night and is still very fatigued. Also a little SOB with walking longer distances but she thought she was just out of shape. No CP. No LE edema.  Her menstrual cycle has been irregular for past few months- skipped a month and this month has had two periods.   Patient Active Problem List  Diagnoses  . DERMATOPHYTOSIS OF FOOT  . MORBID OBESITY  . DEPRESSION  . MIGRAINE W/AURA W/O INTRACT W/O STATUS MIGRNOSUS  . ALLERGIC RHINITIS  . FATIGUE  . FINGER SPRAIN  . THYROMEGALY  . SINUSITIS - ACUTE-NOS  . SHELLFISH ALLERGY  . Routine general medical examination at a health care facility  . Menorrhagia  . Fatigue   Past Medical History  Diagnosis Date  . Allergy   . Depression   . Migraine    Past Surgical History  Procedure Date  . Cholecystectomy 2009  . Tubal ligation 1997  . Laparoscopic gastric banding 2008 & 2009   History  Substance Use Topics  . Smoking status: Former Smoker -- 2.0 packs/day for 20 years    Types: Cigarettes    Quit date: 09/02/2003  . Smokeless tobacco: Not on file  . Alcohol Use: No   Family History  Problem Relation Age of Onset  . Hypertension Mother   . Hyperlipidemia Mother   . HIV Father    Allergies  Allergen Reactions  . Shellfish Allergy     REACTION: throat closing  . Tussionex Pennkinetic Er Other (See Comments)    Bad dreams   Current Outpatient Prescriptions on File Prior to Visit  Medication Sig Dispense Refill  . CALCIUM-MAGNESUIUM-ZINC 333-133-8.3 MG TABS Take 1 tablet by mouth daily.        [name] EPINEPHrine (EPIPEN 2-PAK) 0.3 mg/0.3 mL DEVI Inject as directed and then go to ER       . fexofenadine-pseudoephedrine (ALLEGRA-D 24) 180-240 MG per 24 hr tablet Take 1 tablet by mouth daily.        . fluticasone (FLONASE) 50 MCG/ACT nasal spray Place 2 sprays into the nose daily.        . Multiple Vitamin (MULTIVITAMIN) tablet Take 1 tablet  by mouth daily.        . Omega-3 Fatty Acids (FISH OIL) 1000 MG CAPS Take 1 capsule by mouth daily.        [name] PARoxetine (PAXIL) 20 MG tablet Take 1 tablet (20 mg total) by mouth every morning.  30 tablet  0   The PMH, PSH, Social History, Family History, Medications, and allergies have been reviewed in [facility], and have been updated if relevant.    Review of Systems  See HPI  No CP.   Denies any symptoms of depression or anxiety  Physical exam: BP 122/80  Pulse 76  Temp(Src) 97.8 F (36.6 C) (Oral)  Wt 236 lb 8 oz (107.276 kg)  LMP 09/04/2011  General:  Well-developed,well-nourished,in no acute distress; alert,appropriate and cooperative throughout examination Head:  normocephalic and atraumatic.   Nose:  no external deformity.   Mouth:  good dentition.   Lungs:  Normal respiratory effort, chest expands symmetrically. Lungs are clear to auscultation, no crackles or wheezes. Heart:  Normal rate and regular rhythm. S1 and S2 normal without gallop, murmur, click, rub or other extra sounds. Abdomen:  Bowel sounds positive,abdomen soft and non-tender without masses, organomegaly or hernias noted. Msk:  No deformity or scoliosis noted  of thoracic or lumbar spine.   Extremities:  No clubbing, cyanosis, edema, or deformity noted with normal full range of motion of all joints.   Neurologic:  alert & oriented X3 and gait normal.   Skin:  Intact without suspicious lesions or rashes Psych:  Cognition and judgment appear intact. Alert and cooperative with normal attention span and concentration. No apparent delusions, illusions, hallucinations  Assessment and Plan:  1. Fatigue  Basic Metabolic Panel (BMET), CBC w/Diff, TSH   New.  Based on history, concern for iron deficiency anemia due to prolonged periods. No abnormal findings on exam. Will check labs today. The patient indicates understanding of these issues and agrees with the plan.

## 2011-09-18 NOTE — Patient Instructions (Signed)
Call me if your periods do not lighten up. Have a great weekend!

## 2011-09-18 NOTE — Telephone Encounter (Signed)
Triage Record Num: [id number] Operator: Durward Mallard DiMatteis Patient Name: Barbara Faulkner Call Date & Time: 09/18/2011 10:49:56AM Patient Phone: [phone] PCP: Ruthe Mannan Patient Gender: Female PCP Fax : [phone] Patient DOB: 1966-08-31 Practice Name: [facility] Reason for Call: Caller: [name]/Patient; PCP: Ruthe Mannan Nestor Ramp); CB#: [phone]; Call regarding tired all the time and h/a; sx started 2 weeks ago; slight h/a now; rates 3/10; fatigue is giving more problems now; she slept 16 hr last night; Triaged per Fatigue Guideline; See in 24 hr d/t persistent fatigue that came on suddenly without known cause; appt scheduled for today at 11:45 Dr Dayton Martes Protocol(s) Used: Fatigue Recommended Outcome per Protocol: See Provider within 24 hours Reason for Outcome: Persistent fatigue that came on suddenly without known cause. Care Advice: ~ SYMPTOM / CONDITION MANAGEMENT 09/18/2011 11:00:25AM Page 1 of 1 [email or web address]

## 2011-10-04 ENCOUNTER — Other Ambulatory Visit: Payer: Self-pay | Admitting: Family Medicine

## 2011-11-05 ENCOUNTER — Other Ambulatory Visit: Payer: Self-pay | Admitting: Family Medicine

## 2011-11-26 ENCOUNTER — Ambulatory Visit (INDEPENDENT_AMBULATORY_CARE_PROVIDER_SITE_OTHER): Payer: Federal, State, Local not specified - PPO | Admitting: Family Medicine

## 2011-11-26 ENCOUNTER — Encounter: Payer: Self-pay | Admitting: Family Medicine

## 2011-11-26 VITALS — BP 120/82 | HR 76 | Temp 97.4°F | Wt 236.0 lb

## 2011-11-26 DIAGNOSIS — M549 Dorsalgia, unspecified: Secondary | ICD-10-CM | POA: Insufficient documentation

## 2011-11-26 MED ORDER — MELOXICAM 7.5 MG PO TABS: 7.5000 mg | ORAL_TABLET | Freq: Every day | ORAL | Status: DC

## 2011-11-26 NOTE — Patient Instructions (Signed)
Great to see you. Please take Mobic (meloxicam)- 1 tablet daily with food. Try the exercises in the handout. Let me know the next hoop we need to jump together!

## 2011-11-26 NOTE — Progress Notes (Signed)
46 yo here to discuss breast reduction.  Went for consultation for breast reduction and [facility] She was told that she needed to try conservative management of her back pain prior to insurance approval for procedure.  She has chronic low back pain and shoulder pain. Has tried numerous different fitting bras and types without alleviation of pain.  Very active- has an active job and exercises regularly and her breasts do interfere with how much she can do physically.   Patient Active Problem List  Diagnoses  . DERMATOPHYTOSIS OF FOOT  . MORBID OBESITY  . DEPRESSION  . MIGRAINE W/AURA W/O INTRACT W/O STATUS MIGRNOSUS  . ALLERGIC RHINITIS  . FATIGUE  . FINGER SPRAIN  . THYROMEGALY  . SINUSITIS - ACUTE-NOS  . SHELLFISH ALLERGY  . Routine general medical examination at a health care facility  . Menorrhagia  . Fatigue   Past Medical History  Diagnosis Date  . Allergy   . Depression   . Migraine    Past Surgical History  Procedure Date  . Cholecystectomy 2009  . Tubal ligation 1997  . Laparoscopic gastric banding 2008 & 2009   History  Substance Use Topics  . Smoking status: Former Smoker -- 2.0 packs/day for 20 years    Types: Cigarettes    Quit date: 09/02/2003  . Smokeless tobacco: Not on file  . Alcohol Use: No   Family History  Problem Relation Age of Onset  . Hypertension Mother   . Hyperlipidemia Mother   . HIV Father    Allergies  Allergen Reactions  . Shellfish Allergy     REACTION: throat closing  . Tussionex Pennkinetic Er Other (See Comments)    Bad dreams   Current Outpatient Prescriptions on File Prior to Visit  Medication Sig Dispense Refill  . CALCIUM-MAGNESUIUM-ZINC 333-133-8.3 MG TABS Take 1 tablet by mouth daily.        [name] EPINEPHrine (EPIPEN 2-PAK) 0.3 mg/0.3 mL DEVI Inject as directed and then go to ER       . fexofenadine-pseudoephedrine (ALLEGRA-D 24) 180-240 MG per 24 hr tablet Take 1 tablet by mouth daily.        .  fluticasone (FLONASE) 50 MCG/ACT nasal spray Place 2 sprays into the nose daily.        . Multiple Vitamin (MULTIVITAMIN) tablet Take 1 tablet by mouth daily.        . Omega-3 Fatty Acids (FISH OIL) 1000 MG CAPS Take 1 capsule by mouth daily.        [name] PARoxetine (PAXIL) 20 MG tablet TAKE 1 TABLET BY MOUTH EVERY MORNING  30 tablet  6   The PMH, PSH, Social History, Family History, Medications, and allergies have been reviewed in [facility], and have been updated if relevant.    Review of Systems  See HPI  No CP.   Denies any symptoms of depression or anxiety  Physical exam: BP 120/82  Pulse 76  Temp(Src) 97.4 F (36.3 C) (Oral)  Wt 236 lb (107.049 kg)  General:  Well-developed,well-nourished,in no acute distress; alert,appropriate and cooperative throughout examination Head:  normocephalic and atraumatic.   Nose:  no external deformity.   Mouth:  good dentition.   Msk:  No deformity or scoliosis noted of thoracic or lumbar spine.   Extremities:  No clubbing, cyanosis, edema, or deformity noted with normal full range of motion of all joints.   Neurologic:  alert & oriented X3 and gait normal.   Skin:  Intact without suspicious lesions  or rashes Psych:  Cognition and judgment appear intact. Alert and cooperative with normal attention span and concentration. No apparent delusions, illusions, hallucinations  Assessment and Plan:  1. Back pain    >15 min spent with face to face with patient, >50% counseling and/or coordinating care. rx given for meloxicam- 7.5 mg daily with food. Exercises given and discussed for low back pain. She will follow up with me in 6 weeks. The patient indicates understanding of these issues and agrees with the plan.

## 2011-12-12 ENCOUNTER — Encounter: Payer: Self-pay | Admitting: *Deleted

## 2011-12-12 ENCOUNTER — Ambulatory Visit (INDEPENDENT_AMBULATORY_CARE_PROVIDER_SITE_OTHER): Payer: Federal, State, Local not specified - PPO | Admitting: Family Medicine

## 2011-12-12 ENCOUNTER — Ambulatory Visit (INDEPENDENT_AMBULATORY_CARE_PROVIDER_SITE_OTHER)
Admission: RE | Admit: 2011-12-12 | Discharge: 2011-12-12 | Disposition: A | Discharge status: Home / Self Care | Payer: Federal, State, Local not specified - PPO | Source: Ambulatory Visit | Attending: Family Medicine | Admitting: Family Medicine

## 2011-12-12 ENCOUNTER — Encounter: Payer: Self-pay | Admitting: Family Medicine

## 2011-12-12 ENCOUNTER — Telehealth: Payer: Self-pay | Admitting: Family Medicine

## 2011-12-12 VITALS — BP 118/84 | HR 84 | Temp 97.8°F | Wt 240.0 lb

## 2011-12-12 DIAGNOSIS — R319 Hematuria, unspecified: Secondary | ICD-10-CM

## 2011-12-12 DIAGNOSIS — R1013 Epigastric pain: Secondary | ICD-10-CM

## 2011-12-12 DIAGNOSIS — R109 Unspecified abdominal pain: Secondary | ICD-10-CM

## 2011-12-12 DIAGNOSIS — K2960 Other gastritis without bleeding: Secondary | ICD-10-CM | POA: Insufficient documentation

## 2011-12-12 LAB — POCT URINALYSIS DIPSTICK
Bilirubin, UA: NEGATIVE
Ketones, UA: NEGATIVE
Spec Grav, UA: >=1.03
pH, UA: 5

## 2011-12-12 LAB — LIPASE: Lipase: 19 U/L (ref 11.0–59.0)

## 2011-12-12 LAB — CBC WITH DIFFERENTIAL/PLATELET
Basophils Relative: 0.3 % (ref 0.0–3.0)
Eosinophils Absolute: 0.4 10*3/uL (ref 0.0–0.7)
Eosinophils Relative: 4.3 % (ref 0.0–5.0)
Lymphocytes Relative: 27.6 % (ref 12.0–46.0)
Neutrophils Relative %: 61.5 % (ref 43.0–77.0)
Platelets: 178 10*3/uL (ref 150.0–400.0)
RBC: 4.54 Mil/uL (ref 3.87–5.11)
WBC: 8.4 10*3/uL (ref 4.5–10.5)

## 2011-12-12 LAB — COMPREHENSIVE METABOLIC PANEL
ALT: 14 U/L (ref 0–35)
Albumin: 4 g/dL (ref 3.5–5.2)
Alkaline Phosphatase: 53 U/L (ref 39–117)
CO2: 25 mEq/L (ref 19–32)
Glucose, Bld: 94 mg/dL (ref 70–99)
Potassium: 4.2 mEq/L (ref 3.5–5.1)
Sodium: 140 mEq/L (ref 135–145)
Total Protein: 7 g/dL (ref 6.0–8.3)

## 2011-12-12 MED ORDER — ESOMEPRAZOLE MAGNESIUM 40 MG PO CPDR: DELAYED_RELEASE_CAPSULE | ORAL | Status: DC

## 2011-12-12 NOTE — Patient Instructions (Addendum)
Great to see you. Please make sure you do not take anymore meloxicam or Ibuprofen. Take Nexium samples- 1 tablet twice daily for next two weeks. We will call you with your lab results.   If you develop ANY new symptoms, please call doctor on call or go to urgent care/ER. Please stop by to see Barbara Faulkner up front to set up your CT scan. If your CT scan is normal (no kidney stones), please come back in 1-2 weeks to recheck your urine.

## 2011-12-12 NOTE — Progress Notes (Addendum)
Subjective:    Patient ID: Barbara Faulkner, female    DOB: 05-Aug-1966, 46 y.o.   MRN: [id number]  HPI  46 yo well known to me here for 3 days of abdominal pain.  Pain is epigastric.  It sometimes does radiate to suprapubic area-midline. Has been on Meloxicam for back pain.  Does not feel like her back pain has changed.  No n/v/d. No fevers. No black stools. No blood in stool. Food seems to make it better, especially cold food.  Remote h/o cholecystectomy.  Patient Active Problem List  Diagnoses  . DERMATOPHYTOSIS OF FOOT  . MORBID OBESITY  . DEPRESSION  . MIGRAINE W/AURA W/O INTRACT W/O STATUS MIGRNOSUS  . ALLERGIC RHINITIS  . FATIGUE  . FINGER SPRAIN  . THYROMEGALY  . SINUSITIS - ACUTE-NOS  . SHELLFISH ALLERGY  . Routine general medical examination at a health care facility  . Menorrhagia  . Fatigue  . Back pain  . Epigastric pain   Past Medical History  Diagnosis Date  . Allergy   . Depression   . Migraine    Past Surgical History  Procedure Date  . Cholecystectomy 2009  . Tubal ligation 1997  . Laparoscopic gastric banding 2008 & 2009   History  Substance Use Topics  . Smoking status: Former Smoker -- 2.0 packs/day for 20 years    Types: Cigarettes    Quit date: 09/02/2003  . Smokeless tobacco: Not on file  . Alcohol Use: No   Family History  Problem Relation Age of Onset  . Hypertension Mother   . Hyperlipidemia Mother   . HIV Father    Allergies  Allergen Reactions  . Shellfish Allergy     REACTION: throat closing  . Tussionex Pennkinetic Er Other (See Comments)    Bad dreams   Current Outpatient Prescriptions on File Prior to Visit  Medication Sig Dispense Refill  . CALCIUM-MAGNESUIUM-ZINC 333-133-8.3 MG TABS Take 1 tablet by mouth daily.        [name] EPINEPHrine (EPIPEN 2-PAK) 0.3 mg/0.3 mL DEVI Inject as directed and then go to ER       . fexofenadine-pseudoephedrine (ALLEGRA-D 24) 180-240 MG per 24 hr tablet Take 1 tablet by mouth daily.         . fluticasone (FLONASE) 50 MCG/ACT nasal spray Place 2 sprays into the nose daily.        . meloxicam (MOBIC) 7.5 MG tablet Take 1 tablet (7.5 mg total) by mouth daily.  30 tablet  1  . Multiple Vitamin (MULTIVITAMIN) tablet Take 1 tablet by mouth daily.        . Omega-3 Fatty Acids (FISH OIL) 1000 MG CAPS Take 1 capsule by mouth daily.        [name] PARoxetine (PAXIL) 20 MG tablet TAKE 1 TABLET BY MOUTH EVERY MORNING  30 tablet  6  . esomeprazole (NEXIUM) 40 MG capsule 1 tab twice daily x 2 weeks.  30 capsule  3   The PMH, PSH, Social History, Family History, Medications, and allergies have been reviewed in [facility], and have been updated if relevant.   Review of Systems No dysuria See HPI    Objective:   Physical Exam BP 118/84  Pulse 84  Temp(Src) 97.8 F (36.6 C) (Oral)  Wt 240 lb (108.863 kg)  General:  Well-developed,well-nourished,in no acute distress; alert,appropriate and cooperative throughout examination Head:  normocephalic and atraumatic.   Nose:  no external deformity.   Mouth:  good dentition.   Lungs:  Normal  respiratory effort, chest expands symmetrically. Lungs are clear to auscultation, no crackles or wheezes. Heart:  Normal rate and regular rhythm. S1 and S2 normal without gallop, murmur, click, rub or other extra sounds. Abdomen: diffusely TTP over epigastrium and suprapubic area, no rebound or guarding.  No CVA tenderness.   Neurologic:  alert & oriented X3 and gait normal.   Skin:  Intact without suspicious lesions or rashes Psych:  Cognition and judgment appear intact. Alert and cooperative with normal attention span and concentration. No apparent delusions, illusions, hallucinations      Assessment & Plan:   1. Abdominal  pain, other specified site  New- wide differential. Seems most consistent with GERD/PUD aggrevated by NSAID use.  Stop NSAIDs- start Nexium 40 mg twice daily x 2 weeks.  Given hematuria, will order CT to rule out nephrolithiasis.  If CT  neg, repeat UA in 1-2 weeks to make sure hematuria has cleared. Non toxic without surgical abdomen on exam. Will check labs today as well, including lipase. If any new symptoms develop over the weekend, pt advised to call MD on call or go to [facility]. I CBC with Differential, Lipase, Comprehensive metabolic panel CT Abdomen Pelvis Wo Contrast  2. Hematuria  See above.  Send urine for cx to rule out infectious etiology. POCT urinalysis dipstick, Urine culture

## 2011-12-12 NOTE — Telephone Encounter (Signed)
aller: Barbara Faulkner; PCP: Barbara Faulkner (Barbara Faulkner); CB#: [phone]; ; ; Call regarding Abdominal Pain, Relieved When She Eats;  Pt has been having abdominal pains x 3 days. Pt states the pain stops if she eats something cold only. Otherwise she has the pain - pt describes location as the part top of her stomach. Pt is afebrile. Pt states pain is sharp and is constant until she eats and then she gets relief. Pt was started on MOtrin daily 3 weeks ago that she takes with food. Rn triaged per abdominal pain and advised appt. No appts. Rn spoke with Barbara Faulkner/they will take the pt now. Pt advised to come now to the office.

## 2011-12-14 LAB — URINE CULTURE: Colony Count: 15000

## 2011-12-15 ENCOUNTER — Telehealth: Payer: Self-pay

## 2011-12-15 ENCOUNTER — Encounter: Payer: Self-pay | Admitting: Internal Medicine

## 2011-12-15 DIAGNOSIS — R109 Unspecified abdominal pain: Secondary | ICD-10-CM

## 2011-12-15 NOTE — Telephone Encounter (Signed)
CT was unremarkable and if Nexium has not helped at all, we definitely need further work up.  We can refer to GI but we may not be able to get appointment right away.  I will place stat referral but if pain is worsening, please go to ER.

## 2011-12-15 NOTE — Telephone Encounter (Signed)
Advised pt, she has GI referral scheduled for tomorrow.  She will go to ER if symptoms get worse before then.

## 2011-12-15 NOTE — Telephone Encounter (Signed)
Pt saw Dr Dayton Martes 12/12/11 with upper sharp abdominal pain which is not better. Pain level now is 7; Nexium has not helped. Pain is continuous and pt has no fever, no N&V and no diarrhea. Eating does not effect pain.Pt request call back with instructions at [phone]. Pt uses [facility].

## 2011-12-16 ENCOUNTER — Encounter: Payer: Self-pay | Admitting: Internal Medicine

## 2011-12-16 ENCOUNTER — Ambulatory Visit (INDEPENDENT_AMBULATORY_CARE_PROVIDER_SITE_OTHER): Payer: Federal, State, Local not specified - PPO | Admitting: Internal Medicine

## 2011-12-16 ENCOUNTER — Other Ambulatory Visit: Payer: Self-pay | Admitting: Internal Medicine

## 2011-12-16 VITALS — BP 110/72 | HR 68 | Ht 63.0 in | Wt 244.4 lb

## 2011-12-16 DIAGNOSIS — R1013 Epigastric pain: Secondary | ICD-10-CM

## 2011-12-16 MED ORDER — SUCRALFATE 1 G PO TABS: 1.0000 g | ORAL_TABLET | Freq: Four times a day (QID) | ORAL | Status: DC

## 2011-12-16 MED ORDER — ESOMEPRAZOLE MAGNESIUM 40 MG PO CPDR: DELAYED_RELEASE_CAPSULE | ORAL | Status: DC

## 2011-12-16 NOTE — Progress Notes (Signed)
Subjective:    Patient ID: Barbara Faulkner, female    DOB: 12-15-1965, 46 y.o.   MRN: [id number]  HPI [name] is a 46 yo female with PMH of migraines, obesity, depression who is seen in consultation at the request of Dr. Dayton Martes for evaluation of epigastric pain. The patient reports approximately 2 weeks on and off epigastric pain. This can be quite severe. She has not had associated nausea or vomiting. She's also had no heartburn, dysphagia or odynophagia. She does report that her epigastric pain seems to be better with food, and worse in the morning and at night. He does occasionally awaken her from sleep. She has previously had a cholecystectomy for biliary pain/dyskinesia, and this pain feels similar to that, which was somewhat confusing to her. She's had no fevers or chills, though she has had some night sweats which she has attributed to menopause. Her bowel movements have been increased in volume and frequency of late. Also they've been loose but not watery. She denies bright red blood per rectum or melena. She reports about one week before her symptoms started, she was started on Mobic for joint pain. Mobic was discontinued, but she has subsequently started taking ibuprofen. She is using as much as 1200 mg daily. She was started on Nexium by her primary care but stopped after 3 or 4 days when she did not see a benefit.  Review of Systems As per history of present illness, otherwise negative  Patient Active Problem List  Diagnoses  . DERMATOPHYTOSIS OF FOOT  . MORBID OBESITY  . DEPRESSION  . MIGRAINE W/AURA W/O INTRACT W/O STATUS MIGRNOSUS  . ALLERGIC RHINITIS  . FATIGUE  . FINGER SPRAIN  . THYROMEGALY  . SINUSITIS - ACUTE-NOS  . SHELLFISH ALLERGY  . Routine general medical examination at a health care facility  . Menorrhagia  . Fatigue  . Back pain  . Epigastric pain   Current Outpatient Prescriptions  Medication Sig Dispense Refill  . ibuprofen (ADVIL,MOTRIN) 200 MG tablet Take  as needed (1-6 tabs)      . PARoxetine (PAXIL) 20 MG tablet TAKE 1 TABLET BY MOUTH EVERY MORNING  30 tablet  6  . EPINEPHrine (EPIPEN 2-PAK) 0.3 mg/0.3 mL DEVI Inject as directed and then go to ER       . NEXIUM 40 MG capsule TAKE 1 CAPSULE BY MOUTH TWICE DAILY FOR 2 WEEKS  180 capsule  2  . sucralfate (CARAFATE) 1 G tablet Take 1 tablet (1 g total) by mouth 4 (four) times daily.  120 tablet  1   Allergies  Allergen Reactions  . Shellfish Allergy     REACTION: throat closing  . Tussionex Pennkinetic Er Other (See Comments)    Bad dreams   Family History  Problem Relation Age of Onset  . Hypertension Mother   . Hyperlipidemia Mother   . HIV Father   . Lung cancer Maternal Grandfather     smoker  . Hepatitis Maternal Uncle     drug use   History  Substance Use Topics  . Smoking status: Former Smoker -- 2.0 packs/day for 20 years    Types: Cigarettes    Quit date: 09/02/2003  . Smokeless tobacco: Not on file  . Alcohol Use: No        Objective:   Physical Exam BP 110/72  Pulse 68  Ht 5\' 3"  (1.6 m)  Wt 244 lb 6.4 oz (110.859 kg)  BMI 43.29 kg/m2  LMP 12/05/2011 Constitutional: Well-developed and  well-nourished. No distress. HEENT: Normocephalic and atraumatic. Oropharynx is clear and moist. No oropharyngeal exudate. Conjunctivae are normal. Pupils are equal round and reactive to light. No scleral icterus. Neck: Neck supple. Trachea midline. Cardiovascular: Normal rate, regular rhythm and intact distal pulses. No M/R/G Pulmonary/chest: Effort normal and breath sounds normal. No wheezing, rales or rhonchi. Abdominal: Soft, obese, mild epigastric tenderness to palpation without rebound or guarding,nondistended. Bowel sounds active throughout. There are no masses palpable. No hepatosplenomegaly. Extremities: no clubbing, cyanosis, or edema Lymphadenopathy: No cervical adenopathy noted. Neurological: Alert and oriented to person place and time. Skin: Skin is warm and dry. No  rashes noted. Psychiatric: Normal mood and affect. Behavior is normal.  CBC    Component Value Date/Time   WBC 8.4 12/12/2011 1211   RBC 4.54 12/12/2011 1211   HGB 12.4 12/12/2011 1211   HCT 38.7 12/12/2011 1211   PLT 178.0 12/12/2011 1211   MCV 85.2 12/12/2011 1211   MCHC 32.2 12/12/2011 1211   RDW 13.2 12/12/2011 1211   LYMPHSABS 2.3 12/12/2011 1211   MONOABS 0.5 12/12/2011 1211   EOSABS 0.4 12/12/2011 1211   BASOSABS 0.0 12/12/2011 1211    CMP     Component Value Date/Time   NA 140 12/12/2011 1211   K 4.2 12/12/2011 1211   CL 108 12/12/2011 1211   CO2 25 12/12/2011 1211   GLUCOSE 94 12/12/2011 1211   BUN 18 12/12/2011 1211   CREATININE 0.8 12/12/2011 1211   CALCIUM 8.8 12/12/2011 1211   PROT 7.0 12/12/2011 1211   ALBUMIN 4.0 12/12/2011 1211   AST 22 12/12/2011 1211   ALT 14 12/12/2011 1211   ALKPHOS 53 12/12/2011 1211   BILITOT 0.2* 12/12/2011 1211   GFRNONAA 88.62 09/17/2009 0925       Assessment & Plan:  46 yo female with PMH of migraines, obesity, depression who is seen in consultation at the request of Dr. Dayton Martes for evaluation of epigastric pain  1. Epigastric pain -- the patient's pain is consistent with gastritis or even peptic ulcer disease. Her biggest risk factor is the use NSAIDs. I recommend she resume her Nexium 40 mg daily. She should take this 30 minutes to one hour before her first meal of the day. I've also asked that she discontinue the use of NSAIDs. I will give her prescription for tramadol 50 mg every 6 hours when necessary joint pain. She can increase this to 100 mg every 6 hours if necessary. I also will give her prescription for Carafate 1 g 3 times a day a.c. and at bedtime. I will arrange for an upper endoscopy to further evaluate her epigastric pain rule out peptic ulcer disease/H. Pylori. We discussed upper endoscopy including the risks and benefits and she is agreeable to proceed.

## 2011-12-16 NOTE — Patient Instructions (Signed)
You have been scheduled for an endoscopy with propofol. Please follow written instructions given to you at your visit today.  We have sent the following medications to your pharmacy for you to pick up at your convenience: Carafate  Discontinue taking ibuprofen and NSAIDS    Nonsteroidal Anti-Inflammatory Medications  Nonsteroidal anti-inflammatory medications (NSAIDs) are a group of medicines often used for relief of pain and inflammation. These drugs include ibuprofen, aspirin, and naproxen. They are widely available in an over-the-counter form. The mechanism by which these drugs work in the body is not clearly understood. NSAIDs have many effects on the body, including pain relief, anti-inflammation, fever reduction, and reducing the blood's ability to clot. Most NSAIDs are taken orally in tablet form. Some may also be taken by injection in a vein (intravenously). WHY ATHLETES USE IT Many athletes use NSAIDs for their anti-inflammatory and pain reducing (analgesic) properties. Athletic participation frequently causes aches, pains, and inflammation, which these drugs can treat. There is also some evidence that they speed recovery after injury.  ADVERSE EFFECTS   Nausea.   Stomach pain.   Bleeding from the stomach and intestines.   Inflammation of the kidneys (nephritis).   Inflammation of the liver (hepatitis).   Headache.   Ringing in the ears (tinnitus).   Rash, with sun exposure (photosensitivity).   Increase in fluid volume (fluid retention).   Ulcers of the stomach and small intestine.   Kidney failure.   Liver failure.   Poor control of asthma.   Itching (urticaria).   Increase in nasal polyps (swelling).   Depression.   Loss of red blood cells (anemia).   Loose stools (diarrhea).  PHARMACOLOGY  NSAIDs exist in both short-acting and long-acting forms. Many users of NSAIDs experience pain relief with initial doses, that becomes less effective with continual use.  Most caregivers believe NSAIDs should be used for 2 to 3 weeks, before they are considered ineffective. Most NSAIDs are excreted from the body through the kidneys. The use of NSAIDs under conditions where dehydration can occur increases the risk of side effects to the kidneys and the liver. This is especially common in older athletes and in athletes not acclimated to the heat. All these drugs are well absorbed when taken by mouth. The price of these drugs is variable. PREVENTION  It is recommend that NSAIDs be used after athletic participation to help recovery, or in the early stages of injury treatment. This is the time when athletes are trying to control pain and inflammation. Many athletes choose to take NSAIDs prophylactically (preventative, before injury). However, this may be associated with an increased risk of side effects. If you experience any side effects, including a decrease in performance while taking NSAIDs, discontinue use and consult your caregiver. If you choose to use NSAIDs regularly. for longer than 3 to 6 months, you should obtain screening blood tests for the liver, kidney, and bone marrow. Ongoing use may also increase your risk of a stomach ulcer. Document Released: 08/18/2005 Document Revised: 08/07/2011 Document Reviewed: 11/30/2008 [facility] Patient Information 2012 [place], [place].

## 2011-12-24 ENCOUNTER — Encounter: Payer: Self-pay | Admitting: Internal Medicine

## 2011-12-24 ENCOUNTER — Ambulatory Visit (AMBULATORY_SURGERY_CENTER): Payer: Federal, State, Local not specified - PPO | Admitting: Internal Medicine

## 2011-12-24 VITALS — BP 148/86 | HR 83 | Temp 97.7°F | Resp 16 | Ht 63.0 in | Wt 244.0 lb

## 2011-12-24 DIAGNOSIS — R1013 Epigastric pain: Secondary | ICD-10-CM

## 2011-12-24 DIAGNOSIS — K2990 Gastroduodenitis, unspecified, without bleeding: Secondary | ICD-10-CM

## 2011-12-24 DIAGNOSIS — K2970 Gastritis, unspecified, without bleeding: Secondary | ICD-10-CM

## 2011-12-24 MED ORDER — SODIUM CHLORIDE 0.9 % IV SOLN: 500.0000 mL | INTRAVENOUS | Status: DC

## 2011-12-24 NOTE — Op Note (Signed)
[facility] [address] [place], [place]  [address]  ENDOSCOPY PROCEDURE REPORT  PATIENT:  Barbara Faulkner, Barbara Faulkner  MR#:  [id number] BIRTHDATE:  03-13-1966, 45 yrs. old  GENDER:  female ENDOSCOPIST:  Carie Caddy. Shamere Dilworth, MD Referred by:  Ruthe Mannan, M.D. PROCEDURE DATE:  12/24/2011 PROCEDURE:  EGD with biopsy for H. pylori 19147 ASA CLASS:  Class II INDICATIONS:  epigastric pain MEDICATIONS:   MAC sedation, administered by CRNA, propofol (Diprivan) 180 mg IV TOPICAL ANESTHETIC:  none  DESCRIPTION OF PROCEDURE:   After the risks benefits and alternatives of the procedure were thoroughly explained, informed consent was obtained.  The LB [id number] endoscope was introduced through the mouth and advanced to the second portion of the duodenum, without limitations.  The instrument was slowly withdrawn as the mucosa was fully examined. <<PROCEDUREIMAGES>>  The esophagus and gastroesophageal junction were completely normal in appearance.  Mild to moderate gastritis characterized by erythema and scattered erosions was found in the body and the antrum of the stomach. Biopsies of the antrum and body of the stomach were obtained and sent to pathology.  The duodenal bulb and second duodenum were without abnormalities.    Retroflexed views revealed no abnormalities.    The scope was then withdrawn from the patient and the procedure completed.  COMPLICATIONS:  None  ENDOSCOPIC IMPRESSION: 1) Normal esophagus 2) Mild to moderate gastritis in the body and the antrum of the stomach.  Biopsies performed to rule out h. pylori. 3) Normal examined duodenum  RECOMMENDATIONS: 1) Await pathology results 2) Continue current medications 3) Follow-up of helicobacter pylori status, treat if indicated  Barbara Faulkner M. Barbara Belton, MD  CC:  The Patient Ruthe Mannan MD  n. Rosalie DoctorCarie Caddy. Acsa Estey at 12/24/2011 08:57 AM  [name], [phone]

## 2011-12-24 NOTE — Patient Instructions (Addendum)
YOU HAD AN ENDOSCOPIC PROCEDURE TODAY AT THE [facility]: Refer to the procedure report that was given to you for any specific questions about what was found during the examination.  If the procedure report does not answer your questions, please call your gastroenterologist to clarify.  If you requested that your care partner not be given the details of your procedure findings, then the procedure report has been included in a sealed envelope for you to review at your convenience later.  YOU SHOULD EXPECT: Some feelings of bloating in the abdomen. Passage of more gas than usual.  Walking can help get rid of the air that was put into your GI tract during the procedure and reduce the bloating. If you had a lower endoscopy (such as a colonoscopy or flexible sigmoidoscopy) you may notice spotting of blood in your stool or on the toilet paper. If you underwent a bowel prep for your procedure, then you may not have a normal bowel movement for a few days.  DIET: Your first meal following the procedure should be a light meal and then it is ok to progress to your normal diet.  A half-sandwich or bowl of soup is an example of a good first meal.  Heavy or fried foods are harder to digest and may make you feel nauseous or bloated.  ACTIVITY: Your care partner should take you home directly after the procedure.  You should plan to take it easy, moving slowly for the rest of the day.  You can resume normal activity the day after the procedure however you should NOT DRIVE or use heavy machinery for 24 hours (because of the sedation medicines used during the test).    SYMPTOMS TO REPORT IMMEDIATELY: A gastroenterologist can be reached at any hour.  During normal business hours, 8:30 AM to 5:00 PM Monday through Friday, call [phone].  After hours and on weekends, please call the GI answering service at [phone] who will take a message and have the physician on call contact you  Following upper  endoscopy (EGD)  Vomiting of blood or coffee ground material  New chest pain or pain under the shoulder blades  Painful or persistently difficult swallowing  New shortness of breath  Fever of 100F or higher  Black, tarry-looking stools  FOLLOW UP: If any biopsies were taken you will be contacted by phone or by letter within the next 1-3 weeks.  Call your gastroenterologist if you have not heard about the biopsies in 3 weeks.  Our staff will call the home number listed on your records the next business day following your procedure to check on you and address any questions or concerns that you may have at that time regarding the information given to you following your procedure. This is a courtesy call and so if there is no answer at the home number and we have not heard from you through the emergency physician on call, we will assume that you have returned to your regular daily activities without incident.  SIGNATURES/CONFIDENTIALITY: You and/or your care partner have signed paperwork which will be entered into your electronic medical record.  These signatures attest to the fact that that the information above on your After Visit Summary has been reviewed and is understood.  Full responsibility of the confidentiality of this discharge information lies with you and/or your care-partner.

## 2011-12-24 NOTE — Progress Notes (Signed)
Patient did not experience any of the following events: a burn prior to discharge; a fall within the facility; wrong site/side/patient/procedure/implant event; or a hospital transfer or hospital admission upon discharge from the facility. (G8907) Patient did not have preoperative order for IV antibiotic SSI prophylaxis. (G8918)  

## 2011-12-24 NOTE — Progress Notes (Signed)
Per Dr. Rhea Belton, pt to have a four week follow up office visit.  Scheduled per Leta Baptist- 01-21-12 at 9:00 a.m.

## 2011-12-25 ENCOUNTER — Telehealth: Payer: Self-pay

## 2011-12-25 NOTE — Telephone Encounter (Signed)
Left a message on the pt's answering machine [phone] for the pt to call if any questions or concerns. Maw

## 2011-12-31 ENCOUNTER — Encounter: Payer: Self-pay | Admitting: Internal Medicine

## 2012-01-20 ENCOUNTER — Encounter: Payer: Self-pay | Admitting: Internal Medicine

## 2012-01-21 ENCOUNTER — Ambulatory Visit (INDEPENDENT_AMBULATORY_CARE_PROVIDER_SITE_OTHER): Payer: Federal, State, Local not specified - PPO | Admitting: Internal Medicine

## 2012-01-21 ENCOUNTER — Encounter: Payer: Self-pay | Admitting: Internal Medicine

## 2012-01-21 VITALS — BP 110/80 | HR 78 | Ht 63.0 in | Wt 245.5 lb

## 2012-01-21 DIAGNOSIS — K2960 Other gastritis without bleeding: Secondary | ICD-10-CM

## 2012-01-21 DIAGNOSIS — T39395A Adverse effect of other nonsteroidal anti-inflammatory drugs [NSAID], initial encounter: Secondary | ICD-10-CM

## 2012-01-21 MED ORDER — ESOMEPRAZOLE MAGNESIUM 40 MG PO CPDR: 40.0000 mg | DELAYED_RELEASE_CAPSULE | Freq: Every day | ORAL | Status: DC

## 2012-01-21 MED ORDER — OMEPRAZOLE 20 MG PO CPDR: 20.0000 mg | DELAYED_RELEASE_CAPSULE | Freq: Two times a day (BID) | ORAL | Status: DC

## 2012-01-21 NOTE — Progress Notes (Signed)
  Subjective:    Patient ID: Barbara Faulkner, female    DOB: 05-31-1966, 46 y.o.   MRN: [id number]  HPI 46 yo female with PMH of migraines, obesity, depression who is seen in followup for her epigastric pain. She is alone today. This Whipp underwent upper endoscopy on 12/24/2011, which revealed mild to moderate gastritis in the gastric body and antrum. Biopsies were negative for H. pylori, and did reveal a focal chronic active gastritis without metaplasia dysplasia or malignancy. Her gastritis is felt to be NSAID-induced, given her use of NSAIDs for arthritis pain. She was started on Nexium 40 mg daily, and after endoscopy Carafate 1 g 4 times a day. She has avoided NSAIDs, and her epigastric abdominal pain has completely resolved. She discontinued the Carafate and has had no return of her symptoms. She's also recently reduced her Nexium, and continues without abdominal pain. No nausea or vomiting. No heartburn. No trouble swallowing. No melena or bright red blood per rectum. No fevers or chills.   Review of Systems As per history of present illness, otherwise negative  Current Medications, Allergies, Past Medical History, Past Surgical History, Family History and Social History were reviewed in [employer] record.     Objective:   Physical Exam BP 110/80  Pulse 78  Ht 5\' 3"  (1.6 m)  Wt 245 lb 8 oz (111.358 kg)  BMI 43.49 kg/m2  LMP 01/14/2012 Constitutional: Well-developed and well-nourished. No distress. HEENT: Normocephalic and atraumatic. Oropharynx is clear and moist. No oropharyngeal exudate. Conjunctivae are normal. Pupils are equal round and reactive to light. No scleral icterus. Neck: Neck supple. Trachea midline. Cardiovascular: Normal rate, regular rhythm and intact distal pulses. No M/R/G Pulmonary/chest: Effort normal and breath sounds normal. No wheezing, rales or rhonchi. Abdominal: Soft, obese, nontender, nondistended. Bowel sounds active throughout.  There are no masses palpable. No hepatosplenomegaly. Extremities: no clubbing, cyanosis, or edema Lymphadenopathy: No cervical adenopathy noted. Neurological: Alert and oriented to person place and time. Skin: Skin is warm and dry. No rashes noted. Psychiatric: Normal mood and affect. Behavior is normal.  Prior endoscopy and pathology records reviewed in the electronic medical record    Assessment & Plan:  46 yo female with PMH of migraines, obesity, depression who is seen in followup for her epigastric pain which was secondary to NSAID-induced gastritis  1. NSAID-induced gastritis/epigastric abdominal pain -- the patient's symptoms have completely resolved on PPI therapy and with cessation of NSAIDs. At this point I do not think she needs to continue with daily Nexium therapy. If she has intermittent issues with epigastric abdominal pain or heartburn, I recommended Pepcid 20 mg every 12 hr when necessary. We have discussed, that should she need NSAIDs on a chronic, or daily basis, then I would recommend that she use of PPI during this time. We discussed how, if she needs NSAIDs for a seven-day period, I would take her PPI for 7 day period. This is based on our knowledge that she did in fact have NSAID related gastritis. I do not think she needs PPI, if she is off of nonsteroidal medications. She understands this thinking, and is happy with this plan.  She can return as needed.

## 2012-01-21 NOTE — Patient Instructions (Signed)
Stop taking Carafate.  When taking NSAIDS use Nexium 40mg  daily.   Use Prilosec 20mg  twice daily   Follow up with Dr. Rhea Belton as needed

## 2012-01-30 ENCOUNTER — Ambulatory Visit (INDEPENDENT_AMBULATORY_CARE_PROVIDER_SITE_OTHER): Payer: Federal, State, Local not specified - PPO | Admitting: Family Medicine

## 2012-01-30 ENCOUNTER — Encounter: Payer: Self-pay | Admitting: Family Medicine

## 2012-01-30 VITALS — BP 110/80 | HR 72 | Temp 98.3°F | Wt 250.0 lb

## 2012-01-30 DIAGNOSIS — R5381 Other malaise: Secondary | ICD-10-CM | POA: Insufficient documentation

## 2012-01-30 MED ORDER — DOXYCYCLINE HYCLATE 100 MG PO TABS: 100.0000 mg | ORAL_TABLET | Freq: Two times a day (BID) | ORAL | Status: DC

## 2012-01-30 NOTE — Patient Instructions (Signed)
To wean off paxil: Take 1 tablet every other day for 1 week. Then take 1/2 tablet every other day for 1 week and stop.   I think you may have a tick borne illness like [place] spotted fever. Please take Doxycyline as directed. If you develop any new or worsening symptoms over the weekend, please go to urgent care or ER.  Ok to take Ibuprofen with Nexium as needed for muscle aches.

## 2012-01-30 NOTE — Progress Notes (Signed)
Subjective:    Patient ID: Barbara Faulkner, female    DOB: [birth year], 46 y.o.   MRN: [id number]  HPI  Very pleasant 46 yo female here for abrupt onset of headaches, all over body aches, extreme fatigue and subjective fever.  In chief complaint, stated neck was stiff but tells me it's her upper back, not neck, and she actually "aches all over."  Started four days ago, shortly after returning from a vacation.  She spent a lot of times outside in her friend's backyard that is wooded.  She did find one tick, removed it, was not embedded.  Has had some mild nausea, no vomiting.  No rashes.  Sleeping well but wakes up and feels like she has not slept in days.  No CP, blurred vision or SOB.  Patient Active Problem List  Diagnoses  . DERMATOPHYTOSIS OF FOOT  . MORBID OBESITY  . DEPRESSION  . MIGRAINE W/AURA W/O INTRACT W/O STATUS MIGRNOSUS  . ALLERGIC RHINITIS  . FATIGUE  . FINGER SPRAIN  . THYROMEGALY  . SINUSITIS - ACUTE-NOS  . SHELLFISH ALLERGY  . Routine general medical examination at a health care facility  . Menorrhagia  . Fatigue  . Back pain  . 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 Malaise   Past Medical History  Diagnosis Date  . Allergy   . Depression   . Migraine    Past Surgical History  Procedure Date  . Cholecystectomy 2009  . Tubal ligation 1997  . Laparoscopic gastric banding 2008     removed 2009   History  Substance Use Topics  . Smoking status: Former Smoker -- 2.0 packs/day for 20 years    Types: Cigarettes    Quit date: 09/02/2003  . Smokeless tobacco: Not on file  . Alcohol Use: No   Family History  Problem Relation Age of Onset  . Hypertension Mother   . Hyperlipidemia Mother   . HIV Father   . Lung cancer Maternal Grandfather     smoker  . Hepatitis Maternal Uncle     drug use  . Colon cancer Neg Hx   . Colon polyps Neg Hx   . Rectal cancer Neg Hx   . Stomach cancer Neg Hx    Allergies  Allergen Reactions  .  Hydrocod Polst-Cpm Polst Er Other (See Comments)    Bad dreams  . Shellfish Allergy     REACTION: throat closing   Current Outpatient Prescriptions on File Prior to Visit  Medication Sig Dispense Refill  . EPINEPHrine (EPIPEN 2-PAK) 0.3 mg/0.3 mL DEVI Inject as directed and then go to ER       . esomeprazole (NEXIUM) 40 MG capsule Take 1 capsule (40 mg total) by mouth daily before breakfast.  180 capsule  2  . ibuprofen (ADVIL,MOTRIN) 200 MG tablet Take as needed (1-6 tabs)      . omeprazole (PRILOSEC) 20 MG capsule Take 1 capsule (20 mg total) by mouth 2 (two) times daily.  60 capsule  1  . PARoxetine (PAXIL) 20 MG tablet TAKE 1 TABLET BY MOUTH EVERY MORNING  30 tablet  6   The PMH, PSH, Social History, Family History, Medications, and allergies have been reviewed in [facility], and have been updated if relevant.  Review of Systems See HPI No rashes   No sore throat, no URI symptoms. Objective:   Physical Exam BP 110/80  Pulse 72  Temp(Src) 98.3 F (36.8 C) (Oral)  Wt 250 lb (113.399 kg)  LMP  01/14/2012  General:  Well-developed,well-nourished,in no acute distress; alert,appropriate and cooperative throughout examination Head:  normocephalic and atraumatic, Able to touch chin to chest without difficulty or pain elicited Eyes:  vision grossly intact, pupils equal, pupils round, and pupils reactive to light.   Ears:  R ear normal and L ear normal.   Nose:  no external deformity.   Mouth:  good dentition.   Neck:  No deformities, masses, or tenderness noted. Lungs:  Normal respiratory effort, chest expands symmetrically. Lungs are clear to auscultation, no crackles or wheezes. Heart:  Normal rate and regular rhythm. S1 and S2 normal without gallop, murmur, click, rub or other extra sounds. Abdomen:  Bowel sounds positive,abdomen soft and non-tender without masses, organomegaly or hernias noted. Msk:  No deformity or scoliosis noted of thoracic or lumbar spine.   Extremities:  No  clubbing, cyanosis, edema, or deformity noted with normal full range of motion of all joints.   Neurologic:  alert & oriented X3 and gait normal.   Skin:  Intact without suspicious lesions or rashes Cervical Nodes:  No lymphadenopathy noted Psych:  Cognition and judgment appear intact. Alert and cooperative with normal attention span and concentration. No apparent delusions, illusions, hallucinations      Assessment & Plan:   1. Malaise   New- symptoms concerning for Tick Borne Illness. Given that exposure was recent, RMSF titers will likely be negative. Will treat with doxycyline 100 mg twice daily x 14 days. See pt instructions for details. If symptoms do not improve, will check RMSF titers, Lyme titers, Monospot. Red flag symptoms discussed with pt. The patient indicates understanding of these issues and agrees with the plan.

## 2012-02-02 ENCOUNTER — Telehealth: Payer: Self-pay | Admitting: Family Medicine

## 2012-02-02 ENCOUNTER — Telehealth: Payer: Self-pay

## 2012-02-02 ENCOUNTER — Other Ambulatory Visit (INDEPENDENT_AMBULATORY_CARE_PROVIDER_SITE_OTHER): Payer: Federal, State, Local not specified - PPO

## 2012-02-02 DIAGNOSIS — W57XXXA Bitten or stung by nonvenomous insect and other nonvenomous arthropods, initial encounter: Secondary | ICD-10-CM

## 2012-02-02 NOTE — Telephone Encounter (Signed)
Please give her a note to stay out of work for next several days. There is really no medication to take for those symptoms. If she can tolerate just resting, that is what I would do. Please call us tomorrow with an update.

## 2012-02-02 NOTE — Telephone Encounter (Signed)
Spoke with patient and scheduled lab appt for 02/02/12

## 2012-02-02 NOTE — Telephone Encounter (Signed)
Please call to check on pt. I would like for her to come in to get serology testing for Lyme and RMSF now that she does have a rash. I will place lab order.

## 2012-02-02 NOTE — Telephone Encounter (Signed)
Triage Record Num: [id number] Operator: Gypsy Decant Patient Name: Barbara Faulkner Call Date & Time: 01/31/2012 6:54:47AM Patient Phone: [phone] PCP: Ruthe Mannan Patient Gender: Female PCP Fax : [phone] Patient DOB: 01-23-1966 Practice Name[facility] Reason for Call: Caller: Emmalina/Patient; PCP: Ruthe Mannan [facility]); CB#: [phone]; Call regarding Tick bite on meds for one things but thinks it may be lime disease ; Pt reports that she has "bullseye rash" today and would like to know what to do. Was seen in the office 01/30/2012 and was started on tx for "[facility] Spotted Fever." Was started on Doxycycline 100mg  BID has had one dose 01/30/2012 and would like to know if this covers Lyme disease as well or does she need to be seen again. Call to oncall Kerby Nora, MD, per MD: Antibiotic she is currently taking will cover Lyme disease and [facility] spotted fever. Please call the office if you note fever, symptoms worsening, or unable to keep antibiotic down. Advised pt and pt verbalized understanding. Protocol(s) Used: Bites and Stings - Insects or Spiders Recommended Outcome per Protocol: See Provider within 4 hours Override Outcome if Used in Protocol: Provide Home/Self Care RN Reason for Override Outcome: Physician Orders. Reason for Outcome: History of tick bite AND now has rash, fever, headache, joint or muscle pain or swollen lymph glands Care Advice: ~ Call provider if symptoms worsen or new symptoms develop. Call EMS [phone] if develop change in level of consciousness, paralysis, difficulty breathing, chest pain or palpitations, or severe signs dehydration. ~ ~ SYMPTOM / CONDITION MANAGEMENT 01/31/2012 7:14:21AM Page 1 of 1 [id number]

## 2012-02-02 NOTE — Telephone Encounter (Signed)
Pt came in for add'l lab; pt still very tired,lightheaded, nausea and rt knee and ankle pain. Any med pt can take or should she stay out of work. Pt tried to work Sat and had to leave work due to above symptoms.[employer] [employer].

## 2012-02-02 NOTE — Telephone Encounter (Signed)
Advised patient, letter printed, placed up front for pick up.

## 2012-02-03 ENCOUNTER — Telehealth: Payer: Self-pay | Admitting: *Deleted

## 2012-02-03 ENCOUNTER — Emergency Department: Payer: Self-pay | Admitting: Emergency Medicine

## 2012-02-03 LAB — URINALYSIS, COMPLETE
Bilirubin,UR: NEGATIVE
Glucose,UR: NEGATIVE mg/dL (ref 0–75)
Ketone: NEGATIVE
Nitrite: NEGATIVE
Ph: 6 (ref 4.5–8.0)
RBC,UR: 4 /HPF (ref 0–5)
Squamous Epithelial: 8
WBC UR: <1 /HPF (ref 0–5)

## 2012-02-03 LAB — CBC WITH DIFFERENTIAL/PLATELET
Basophil %: 0.2 %
Eosinophil #: 0.4 10*3/uL (ref 0.0–0.7)
HCT: 39.5 % (ref 35.0–47.0)
HGB: 12.7 g/dL (ref 12.0–16.0)
Lymphocyte #: 2.9 10*3/uL (ref 1.0–3.6)
Lymphocyte %: 24.7 %
MCH: 27.5 pg (ref 26.0–34.0)
MCV: 85 fL (ref 80–100)
Monocyte #: 0.6 x10 3/mm (ref 0.2–0.9)
Monocyte %: 5 %
Platelet: 169 10*3/uL (ref 150–440)
RBC: 4.63 10*6/uL (ref 3.80–5.20)
RDW: 13.4 % (ref 11.5–14.5)
WBC: 11.8 10*3/uL — ABNORMAL HIGH (ref 3.6–11.0)

## 2012-02-03 LAB — COMPREHENSIVE METABOLIC PANEL
Alkaline Phosphatase: 57 U/L (ref 50–136)
Anion Gap: 8 (ref 7–16)
BUN: 14 mg/dL (ref 7–18)
Bilirubin,Total: 0.4 mg/dL (ref 0.2–1.0)
Calcium, Total: 8.5 mg/dL (ref 8.5–10.1)
Chloride: 109 mmol/L — ABNORMAL HIGH (ref 98–107)
Co2: 26 mmol/L (ref 21–32)
EGFR (African American): >60
Potassium: 4 mmol/L (ref 3.5–5.1)
SGOT(AST): 19 U/L (ref 15–37)
SGPT (ALT): 18 U/L
Sodium: 143 mmol/L (ref 136–145)

## 2012-02-03 NOTE — Telephone Encounter (Signed)
Pt states she got up this morning, felt dizzy and vomited yellow greenish liquid for about 5 minutes.  No fever or diarrhea.  She asks if Dr. Dayton Martes thinks she should go to ER.  Per Dr Dayton Martes advised yes, pt should go to ER for evaluation.

## 2012-02-04 ENCOUNTER — Telehealth: Payer: Self-pay | Admitting: *Deleted

## 2012-02-04 NOTE — Telephone Encounter (Signed)
Thanks for update

## 2012-02-04 NOTE — Telephone Encounter (Signed)
Spoke with patient.  She went to [facility] ED last night and was told she was dehydrated.  Was given two IV bags of fluids and told to continue doxycycline and follow your other instructions.  She will keep you informed on how she is feeling.

## 2012-02-09 ENCOUNTER — Telehealth: Payer: Self-pay | Admitting: *Deleted

## 2012-02-09 MED ORDER — TRAMADOL HCL 50 MG PO TABS: 50.0000 mg | ORAL_TABLET | Freq: Three times a day (TID) | ORAL | Status: AC | PRN

## 2012-02-09 NOTE — Telephone Encounter (Signed)
Pt states she is feeling better but is still having a lot of joint pain and asks if something can be called in for her.  She has been taking two advil twice a day and that's not helping.  She would like something sent to [facility] in [place].

## 2012-02-09 NOTE — Telephone Encounter (Signed)
Rx for tramadol sent. Please keep [place] posted with symptoms.

## 2012-02-09 NOTE — Telephone Encounter (Signed)
Advised patient

## 2012-02-13 ENCOUNTER — Telehealth: Payer: Self-pay

## 2012-02-13 DIAGNOSIS — M791 Myalgia, unspecified site: Secondary | ICD-10-CM

## 2012-02-13 DIAGNOSIS — R21 Rash and other nonspecific skin eruption: Secondary | ICD-10-CM

## 2012-02-13 NOTE — Telephone Encounter (Signed)
Pt's husband will bring by [facility] papers for pt and pts husband; pt has lymes disease, joint pain and mobility issues.

## 2012-02-18 DIAGNOSIS — Z0279 Encounter for issue of other medical certificate: Secondary | ICD-10-CM

## 2012-02-18 MED ORDER — DOXYCYCLINE HYCLATE 100 MG PO TABS: 100.0000 mg | ORAL_TABLET | Freq: Two times a day (BID) | ORAL | Status: AC

## 2012-02-18 NOTE — Telephone Encounter (Signed)
Left message asking pt to call back. 

## 2012-02-18 NOTE — Telephone Encounter (Signed)
Advised pt that FMLA paperwork is ready for pick up.  She does want referral to rheumatology, prefers to see someone in [place].  Also, she says she still has dizziness and the nausea is coming back.  She is asking if another round of antibiotic would help her.  Uses [facility] in [place].

## 2012-02-18 NOTE — Telephone Encounter (Signed)
She should have received an adequate dose, but let's extend the course.  I have sent a refill to her pharmacy. I will place rheum referral. I would like to also order some additional labs.  I will place orders.

## 2012-02-20 ENCOUNTER — Other Ambulatory Visit (INDEPENDENT_AMBULATORY_CARE_PROVIDER_SITE_OTHER): Payer: Federal, State, Local not specified - PPO

## 2012-02-20 DIAGNOSIS — M791 Myalgia, unspecified site: Secondary | ICD-10-CM

## 2012-02-20 DIAGNOSIS — IMO0001 Reserved for inherently not codable concepts without codable children: Secondary | ICD-10-CM

## 2012-02-20 DIAGNOSIS — R21 Rash and other nonspecific skin eruption: Secondary | ICD-10-CM

## 2012-02-20 LAB — CK: Total CK: 162 U/L (ref 7–177)

## 2012-02-20 LAB — COMPREHENSIVE METABOLIC PANEL
AST: 18 U/L (ref 0–37)
Alkaline Phosphatase: 55 U/L (ref 39–117)
BUN: 16 mg/dL (ref 6–23)
Creatinine, Ser: 0.9 mg/dL (ref 0.4–1.2)
Glucose, Bld: 101 mg/dL — ABNORMAL HIGH (ref 70–99)
Total Bilirubin: 0.3 mg/dL (ref 0.3–1.2)

## 2012-02-20 LAB — SEDIMENTATION RATE: Sed Rate: 9 mm/hr (ref 0–22)

## 2012-02-20 NOTE — Telephone Encounter (Signed)
Some rheumatology labs and repeat tick panel.  I already placed orders.

## 2012-02-20 NOTE — Telephone Encounter (Signed)
Advised patient's husband.  What additional labs do you want pt to have?

## 2012-02-23 LAB — B. BURGDORFI ANTIBODIES: B burgdorferi Ab IgG+IgM: 0.24 {ISR}

## 2012-02-26 ENCOUNTER — Telehealth: Payer: Self-pay | Admitting: Family Medicine

## 2012-02-26 NOTE — Telephone Encounter (Signed)
Pt dropped off FMLA papers to be completed I put them in your inbox.

## 2012-02-27 DIAGNOSIS — Z0279 Encounter for issue of other medical certificate: Secondary | ICD-10-CM

## 2012-02-27 NOTE — Telephone Encounter (Signed)
Forms given to Carrie, who will notify patient. 

## 2012-02-27 NOTE — Telephone Encounter (Signed)
Completed to the best of my knowledge (we still do not have a definitive diagnosis or known course of her symptoms). In my box.

## 2012-03-08 ENCOUNTER — Telehealth: Payer: Self-pay | Admitting: Family Medicine

## 2012-03-08 NOTE — Telephone Encounter (Signed)
Pt's Husband dropped off her FMLA forms that were missing information on page 3 of the forms. I put them in your inbox.

## 2012-03-08 NOTE — Telephone Encounter (Signed)
In my box. Please call pt for an update, otherwise I am unsure how she would like this filled out. Thanks.

## 2012-03-09 NOTE — Telephone Encounter (Signed)
Spoke with patient.  She will be seeing rheumatologist on Thursday.  Thinks she needs to continue to be out of work.  States most of her symptoms are better but the joint pain is no better. She doesn't know how you should fill out this paperwork.  She just knows that she is still in pain and she doesn't know how long this will continue.  Forms are back on your desk.

## 2012-03-10 NOTE — Telephone Encounter (Signed)
In my box

## 2012-03-12 NOTE — Telephone Encounter (Signed)
Forms given to patients husband.

## 2012-04-12 ENCOUNTER — Other Ambulatory Visit: Payer: Self-pay | Admitting: *Deleted

## 2012-04-12 MED ORDER — MELOXICAM 7.5 MG PO TABS: 7.5000 mg | ORAL_TABLET | Freq: Every day | ORAL | Status: DC

## 2012-04-12 NOTE — Telephone Encounter (Signed)
Patient request refill on meloxicam but this medication not on her medication list. Okay to refill?

## 2012-05-07 ENCOUNTER — Telehealth: Payer: Self-pay | Admitting: Family Medicine

## 2012-05-07 NOTE — Telephone Encounter (Signed)
Advised patient as instructed. 

## 2012-05-07 NOTE — Telephone Encounter (Signed)
Caller: Camie/Patient; Patient Name: Barbara Faulkner; PCP: Ruthe Mannan [facility]); Best Callback Phone Number: [phone]; Reason for call: Sinus symptoms; onset 05/06/12; head is swimmy and hurting; sore throat; sinuses draining; afebrile; using Dayquil; calling to see if something could be called in for her sinuses; All emergent symptoms of Upper Respiratory Infection protocol ruled out except "moderate to severe headache unrelieved by nonprescription medication"; disposition call provider; informed Mauriana that an appt is generally required prior to any antibiotics, but a message would be sent to the office

## 2012-05-07 NOTE — Telephone Encounter (Signed)
Unfortunately has to be seen for abx as most sinus infections are caused by viruses and abx won't help, only cause harm.  Drink lots of fluids.  Treat sympotmatically with Mucinex, nasal saline irrigation, and Tylenol/Ibuprofen. Also try claritin D or zyrtec D over the counter- two times a day as needed ( have to sign for them at pharmacy). You can use warm compresses.  Cough suppressant at night. Call if not improving as expected in 5-7 days.

## 2012-06-04 ENCOUNTER — Other Ambulatory Visit: Payer: Self-pay | Admitting: Family Medicine

## 2012-06-04 DIAGNOSIS — Z0000 Encounter for general adult medical examination without abnormal findings: Secondary | ICD-10-CM

## 2012-06-04 DIAGNOSIS — Z136 Encounter for screening for cardiovascular disorders: Secondary | ICD-10-CM

## 2012-06-07 ENCOUNTER — Telehealth: Payer: Self-pay

## 2012-06-07 NOTE — Telephone Encounter (Signed)
Faxed records request to [facility].

## 2012-06-07 NOTE — Telephone Encounter (Signed)
Yes we can. Please get [facility] records.

## 2012-06-07 NOTE — Telephone Encounter (Signed)
Notes from [facility] are on your desk.

## 2012-06-07 NOTE — Telephone Encounter (Signed)
Pt to have CPX lab work 06/11/12; wants to know if any add'l testing can be done for joint pain and malaise; pt states diagnosed lyme disease in June by [facility] ER dr.

## 2012-06-11 ENCOUNTER — Other Ambulatory Visit (INDEPENDENT_AMBULATORY_CARE_PROVIDER_SITE_OTHER): Payer: Federal, State, Local not specified - PPO

## 2012-06-11 DIAGNOSIS — Z0000 Encounter for general adult medical examination without abnormal findings: Secondary | ICD-10-CM

## 2012-06-11 DIAGNOSIS — Z136 Encounter for screening for cardiovascular disorders: Secondary | ICD-10-CM

## 2012-06-11 LAB — LIPID PANEL
Cholesterol: 126 mg/dL (ref 0–200)
LDL Cholesterol: 77 mg/dL (ref 0–99)
Total CHOL/HDL Ratio: 4
Triglycerides: 65 mg/dL (ref 0.0–149.0)

## 2012-06-11 LAB — COMPREHENSIVE METABOLIC PANEL
Albumin: 3.7 g/dL (ref 3.5–5.2)
Alkaline Phosphatase: 45 U/L (ref 39–117)
BUN: 11 mg/dL (ref 6–23)
Calcium: 8.9 mg/dL (ref 8.4–10.5)
Chloride: 108 mEq/L (ref 96–112)
Glucose, Bld: 101 mg/dL — ABNORMAL HIGH (ref 70–99)
Potassium: 4.1 mEq/L (ref 3.5–5.1)
Sodium: 140 mEq/L (ref 135–145)
Total Protein: 6.6 g/dL (ref 6.0–8.3)

## 2012-06-15 ENCOUNTER — Other Ambulatory Visit: Payer: Federal, State, Local not specified - PPO

## 2012-06-22 ENCOUNTER — Ambulatory Visit (INDEPENDENT_AMBULATORY_CARE_PROVIDER_SITE_OTHER): Payer: Federal, State, Local not specified - PPO | Admitting: Family Medicine

## 2012-06-22 ENCOUNTER — Telehealth: Payer: Self-pay | Admitting: *Deleted

## 2012-06-22 ENCOUNTER — Ambulatory Visit: Payer: Self-pay | Admitting: Family Medicine

## 2012-06-22 ENCOUNTER — Other Ambulatory Visit (HOSPITAL_COMMUNITY)
Admission: RE | Admit: 2012-06-22 | Discharge: 2012-06-22 | Disposition: A | Discharge status: Home / Self Care | Payer: Federal, State, Local not specified - PPO | Source: Ambulatory Visit | Attending: Family Medicine | Admitting: Family Medicine

## 2012-06-22 ENCOUNTER — Encounter: Payer: Self-pay | Admitting: Family Medicine

## 2012-06-22 VITALS — BP 130/90 | HR 80 | Temp 98.2°F | Ht 64.0 in | Wt 274.0 lb

## 2012-06-22 DIAGNOSIS — A6920 Lyme disease, unspecified: Secondary | ICD-10-CM

## 2012-06-22 DIAGNOSIS — Z01419 Encounter for gynecological examination (general) (routine) without abnormal findings: Secondary | ICD-10-CM | POA: Insufficient documentation

## 2012-06-22 DIAGNOSIS — Z8619 Personal history of other infectious and parasitic diseases: Secondary | ICD-10-CM

## 2012-06-22 DIAGNOSIS — Z23 Encounter for immunization: Secondary | ICD-10-CM

## 2012-06-22 DIAGNOSIS — Z0000 Encounter for general adult medical examination without abnormal findings: Secondary | ICD-10-CM

## 2012-06-22 DIAGNOSIS — Z1151 Encounter for screening for human papillomavirus (HPV): Secondary | ICD-10-CM | POA: Insufficient documentation

## 2012-06-22 MED ORDER — DOXYCYCLINE HYCLATE 100 MG PO TABS: 100.0000 mg | ORAL_TABLET | Freq: Two times a day (BID) | ORAL | Status: DC

## 2012-06-22 MED ORDER — TRAMADOL HCL 50 MG PO TABS: 50.0000 mg | ORAL_TABLET | Freq: Three times a day (TID) | ORAL | Status: DC | PRN

## 2012-06-22 NOTE — Progress Notes (Signed)
46 yo here for CPX.  Well woman- G2P2 s/p BTL. Last pap smear was 04/2011- neg cytology but pos for HPV.  No vaginal discharge. Due for mammogram (already scheduled for later today).   No FH of cervical, breast, or uterine CA. No FH of HLD or CAD.   She has had a rough year- treated her with 1 month doxycyline for presumed Lyme's disease- actually did eventually develop the target lesion on her right thigh.  Serologies were neg.  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  I referred her to rheumatology- Dr. Gavin Potters.  Last note from 03/2012 reviewed.  He started her on NSAIDs but questioned the validity of the inflammatory arthritis.  Patient Active Problem List  Diagnosis  . DERMATOPHYTOSIS OF FOOT  . MORBID OBESITY  . DEPRESSION  . MIGRAINE W/AURA W/O INTRACT W/O STATUS MIGRNOSUS  . ALLERGIC RHINITIS  . FATIGUE  . FINGER SPRAIN  . THYROMEGALY  . SINUSITIS - ACUTE-NOS  . SHELLFISH ALLERGY  . Routine general medical examination at a health care facility  . Menorrhagia  . Fatigue  . Back pain  . 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 Malaise  . History of HPV infection   Past Medical History  Diagnosis Date  . Allergy   . Depression   . Migraine    Past Surgical History  Procedure Date  . Cholecystectomy 2009  . Tubal ligation 1997  . Laparoscopic gastric banding 2008     removed 2009   History  Substance Use Topics  . Smoking status: Former Smoker -- 2.0 packs/day for 20 years    Types: Cigarettes    Quit date: 09/02/2003  . Smokeless tobacco: Not on file  . Alcohol Use: No   Family History  Problem Relation Age of Onset  . Hypertension Mother   . Hyperlipidemia Mother   . HIV Father   . Lung cancer Maternal Grandfather     smoker  . Hepatitis Maternal Uncle     drug use  . Colon cancer Neg Hx   . Colon polyps Neg Hx   . Rectal cancer Neg Hx   . Stomach cancer Neg Hx    Allergies  Allergen Reactions  .  Hydrocod Polst-Cpm Polst Er Other (See Comments)    Bad dreams  . Shellfish Allergy     REACTION: throat closing   Current Outpatient Prescriptions on File Prior to Visit  Medication Sig Dispense Refill  . EPINEPHrine (EPIPEN 2-PAK) 0.3 mg/0.3 mL DEVI Inject as directed and then go to ER       . esomeprazole (NEXIUM) 40 MG capsule Take 1 capsule (40 mg total) by mouth daily before breakfast.  180 capsule  2  . ibuprofen (ADVIL,MOTRIN) 200 MG tablet Take as needed (1-6 tabs)      . meloxicam (MOBIC) 7.5 MG tablet Take 1 tablet (7.5 mg total) by mouth daily.  30 tablet  0  . omeprazole (PRILOSEC) 20 MG capsule Take 1 capsule (20 mg total) by mouth 2 (two) times daily.  60 capsule  1  . PARoxetine (PAXIL) 20 MG tablet TAKE 1 TABLET BY MOUTH EVERY MORNING  30 tablet  6   The PMH, PSH, Social History, Family History, Medications, and allergies have been reviewed in [facility], and have been updated if relevant.    Review of Systems  See HPI  Patient reports no  vision/ hearing changes,anorexia, weight change, fever ,adenopathy, persistant / recurrent hoarseness,  swallowing issues, chest pain, edema,persistant / recurrent cough, hemoptysis, dyspnea(rest, exertional, paroxysmal nocturnal), gastrointestinal  bleeding (melena, rectal bleeding), abdominal pain, excessive heart burn, GU symptoms(dysuria, hematuria, pyuria, voiding/incontinence  Issues) syncope, focal weakness, severe memory loss, concerning skin lesions, depression, anxiety, abnormal bruising/bleeding, major joint swelling, breast masses or abnormal vaginal bleeding.    Physical exam: BP 130/90  Pulse 80  Temp 98.2 F (36.8 C)  Ht 5\' 4"  (1.626 m)  Wt 274 lb (124.286 kg)  BMI 47.03 kg/m2  General:  Obese, ,well-nourished,in no acute distress; alert,appropriate and cooperative throughout examination Head:  normocephalic and atraumatic.   Eyes:  vision grossly intact, pupils equal, pupils round, and pupils reactive to light.   Ears:  R  ear normal and L ear normal.   Nose:  no external deformity.   Mouth:  good dentition.   Neck:  No deformities, masses, or tenderness noted. Breasts:  No mass, nodules, thickening, tenderness, bulging, retraction, inflamation, nipple discharge or skin changes noted.   Lungs:  Normal respiratory effort, chest expands symmetrically. Lungs are clear to auscultation, no crackles or wheezes. Heart:  Normal rate and regular rhythm. S1 and S2 normal without gallop, murmur, click, rub or other extra sounds. Abdomen:  Bowel sounds positive,abdomen soft and non-tender without masses, organomegaly or hernias noted. Rectal:  no external abnormalities.   Genitalia:  Pelvic Exam:        External: normal female genitalia without lesions or masses        Vagina: normal without lesions or masses        Cervix: normal without lesions or masses        Adnexa: normal bimanual exam without masses or fullness        Uterus: normal by palpation        Pap smear: performed Msk:  No deformity or scoliosis noted of thoracic or lumbar spine.   Extremities:  No clubbing, cyanosis, edema, or deformity noted with normal full range of motion of all joints.   Neurologic:  alert & oriented X3 and gait normal.   Skin:  Intact without suspicious lesions or rashes Cervical Nodes:  No lymphadenopathy noted Axillary Nodes:  No palpable lymphadenopathy Psych:  Cognition and judgment appear intact. Alert and cooperative with normal attention span and concentration. No apparent delusions, illusions, hallucinations  Assessment and Plan: 1. Routine general medical examination at a health care facility  Reviewed preventive care protocols, scheduled due services, and updated immunizations Discussed nutrition, exercise, diet, and healthy lifestyle.  Cytology - PAP  2. History of HPV infection  Cytology - PAP  3. Lyme disease  With neg lab work. There is evidence to give another 1 month course of doxycyline for Lyme associated  arthritis.  Will go ahead and try this again and send in rx for tramadol as well for pain.  She has a number a Lyme disease specialist and wants me to investigate this practice further- she will call be back with the name and number.   4. Need for prophylactic vaccination and inoculation against influenza  Flu vaccine greater than or equal to 3yo preservative free IM

## 2012-06-22 NOTE — Patient Instructions (Addendum)
Good to see you as always, Misty Stanley. Call me with that physician's name.  Please let me know how the tramadol and doxycyline are helping with your symptoms.

## 2012-06-22 NOTE — Telephone Encounter (Signed)
Pt called back with name and # of Dr for referral. It's Dr Gertha Calkin  [phone]

## 2012-06-23 ENCOUNTER — Encounter: Payer: Self-pay | Admitting: Family Medicine

## 2012-06-23 NOTE — Telephone Encounter (Signed)
Shirlee Limerick, would it be possible to refer to this physician- apparently she specializes in Lyme disease.

## 2012-06-23 NOTE — Telephone Encounter (Signed)
I called Dr Mauricia Area office and she is not accepting New Patients at this time, will call the patient and will let her know that.

## 2012-06-24 ENCOUNTER — Telehealth: Payer: Self-pay | Admitting: Family Medicine

## 2012-06-24 ENCOUNTER — Encounter: Payer: Self-pay | Admitting: Family Medicine

## 2012-06-24 ENCOUNTER — Encounter: Payer: Self-pay | Admitting: *Deleted

## 2012-06-24 LAB — HM MAMMOGRAPHY: HM Mammogram: NORMAL

## 2012-06-24 NOTE — Telephone Encounter (Signed)
Called Dr Mauricia Area office to refer patient for Lyme Disease and she is not accepting New patients at this time. Called the patient to let her know this and she said she was aware that they were not taking new patients. She wanted you to know also that the pain meds you gave her were working.

## 2012-06-25 ENCOUNTER — Encounter: Payer: Self-pay | Admitting: Family Medicine

## 2012-06-25 ENCOUNTER — Encounter: Payer: Self-pay | Admitting: *Deleted

## 2012-06-25 LAB — HM PAP SMEAR: HM Pap smear: NORMAL

## 2012-07-05 ENCOUNTER — Other Ambulatory Visit: Payer: Self-pay

## 2012-07-05 MED ORDER — TRAMADOL HCL 50 MG PO TABS: 50.0000 mg | ORAL_TABLET | Freq: Three times a day (TID) | ORAL | Status: DC | PRN

## 2012-07-05 NOTE — Telephone Encounter (Signed)
Pt request refill tramadol to [facility] [address]. Pt out of med.Please advise.

## 2012-07-14 ENCOUNTER — Telehealth: Payer: Self-pay | Admitting: Family Medicine

## 2012-07-14 MED ORDER — TRAMADOL HCL 50 MG PO TABS: 50.0000 mg | ORAL_TABLET | Freq: Four times a day (QID) | ORAL | Status: DC | PRN

## 2012-07-14 NOTE — Telephone Encounter (Signed)
Advised patient

## 2012-07-14 NOTE — Telephone Encounter (Signed)
Dose increased to 1 tablet every 6 hours as needed and rx sent to her pharmacy.

## 2012-07-14 NOTE — Telephone Encounter (Signed)
Patient calling about the Tramadol 50mg  1 po q8 hours.  It will give her total pain relief for about 2 hours and then the pain returns.  She is asking if the medication dose can be increased?   States that she does work.   Denies any change in the pain.    Uses [facility] in [place].

## 2012-07-21 ENCOUNTER — Telehealth: Payer: Self-pay

## 2012-07-21 DIAGNOSIS — A6923 Arthritis due to Lyme disease: Secondary | ICD-10-CM

## 2012-07-21 NOTE — Telephone Encounter (Signed)
Pt has lyme disease and request referral to Dr Sampson Goon, an infection disease physician at [facility]. Pt will wait to hear from pt care coordinator.

## 2012-07-21 NOTE — Telephone Encounter (Signed)
Sure.  Dr. Sampson Goon is excellent.

## 2012-07-22 NOTE — Telephone Encounter (Signed)
Referral faxed to Dr Sampson Goon, waiting for review of records and if Dr will accept this patient. Sandrea Hammond

## 2012-08-02 ENCOUNTER — Other Ambulatory Visit: Payer: Self-pay | Admitting: *Deleted

## 2012-08-02 MED ORDER — TRAMADOL HCL 50 MG PO TABS: 50.0000 mg | ORAL_TABLET | Freq: Four times a day (QID) | ORAL | Status: DC | PRN

## 2012-08-02 NOTE — Telephone Encounter (Signed)
Tramadol called to [facility] 

## 2012-08-23 ENCOUNTER — Other Ambulatory Visit: Payer: Self-pay | Admitting: *Deleted

## 2012-08-23 MED ORDER — TRAMADOL HCL 50 MG PO TABS: 50.0000 mg | ORAL_TABLET | Freq: Four times a day (QID) | ORAL | Status: DC | PRN

## 2012-08-23 NOTE — Telephone Encounter (Signed)
done

## 2012-08-23 NOTE — Telephone Encounter (Signed)
Please refil one in Dr Elmer Sow absence, thanks

## 2012-09-10 ENCOUNTER — Other Ambulatory Visit: Payer: Self-pay | Admitting: Family Medicine

## 2012-09-10 NOTE — Telephone Encounter (Signed)
Rout to PCP 

## 2012-09-27 ENCOUNTER — Other Ambulatory Visit: Payer: Self-pay | Admitting: *Deleted

## 2012-09-27 MED ORDER — TRAMADOL HCL 50 MG PO TABS: ORAL_TABLET | ORAL | Status: DC

## 2012-09-27 NOTE — Telephone Encounter (Signed)
Faxed refill request from [facility].  Pt last got 60 on 09/10/12.

## 2012-10-25 ENCOUNTER — Telehealth: Payer: Self-pay | Admitting: Family Medicine

## 2012-10-25 NOTE — Telephone Encounter (Signed)
Patient Information:  Caller Name: Barbara Faulkner  Phone: [phone]  Patient: Barbara Faulkner Age  Gender: Female  DOB: [birth year]  Age: 47 Years  PCP: Ruthe Mannan [facility])  Pregnant: No  Office Follow Up:  Does the office need to follow up with this patient?: No  Instructions For The Office: N/A   Symptoms  Reason For Call & Symptoms: Patient states she has had menstrual cycle every 2 weeks.  Reviewed Health History In EMR: Yes  Reviewed Medications In EMR: Yes  Reviewed Allergies In EMR: Yes  Reviewed Surgeries / Procedures: Yes  Date of Onset of Symptoms: 10/25/2010 OB / GYN:  LMP: 10/21/2012  Guideline(s) Used:  Menstrual Period - Missed or Late  Vaginal Bleeding - Abnormal  Disposition Per Guideline:   See Today in Office  Reason For Disposition Reached:   Patient wants to be seen  Advice Given:  N/A  RN Overrode Recommendation:  Make Appointment  Pt wants to see her PCP and the 1st available appt was scheduled.  Appointment Scheduled:  10/27/2012 10:45:00 Appointment Scheduled Provider:  Ruthe Mannan [facility])

## 2012-10-27 ENCOUNTER — Ambulatory Visit (INDEPENDENT_AMBULATORY_CARE_PROVIDER_SITE_OTHER): Payer: Federal, State, Local not specified - PPO | Admitting: Family Medicine

## 2012-10-27 ENCOUNTER — Encounter: Payer: Self-pay | Admitting: Family Medicine

## 2012-10-27 VITALS — BP 124/90 | HR 72 | Temp 97.7°F | Wt 276.0 lb

## 2012-10-27 DIAGNOSIS — N938 Other specified abnormal uterine and vaginal bleeding: Secondary | ICD-10-CM | POA: Insufficient documentation

## 2012-10-27 LAB — CBC WITH DIFFERENTIAL/PLATELET
Basophils Absolute: 0 10*3/uL (ref 0.0–0.1)
Eosinophils Absolute: 0.3 10*3/uL (ref 0.0–0.7)
Eosinophils Relative: 2.5 % (ref 0.0–5.0)
MCHC: 32.5 g/dL (ref 30.0–36.0)
MCV: 81.1 fl (ref 78.0–100.0)
Monocytes Absolute: 0.5 10*3/uL (ref 0.1–1.0)
Neutrophils Relative %: 66.8 % (ref 43.0–77.0)
Platelets: 216 10*3/uL (ref 150.0–400.0)
RDW: 14.3 % (ref 11.5–14.6)
WBC: 10.8 10*3/uL — ABNORMAL HIGH (ref 4.5–10.5)

## 2012-10-27 LAB — TSH: TSH: 0.85 u[IU]/mL (ref 0.35–5.50)

## 2012-10-27 MED ORDER — MEDROXYPROGESTERONE ACETATE 10 MG PO TABS: ORAL_TABLET | ORAL | Status: DC

## 2012-10-27 MED ORDER — ESTROGENS CONJUGATED 1.25 MG PO TABS: ORAL_TABLET | ORAL | Status: DC

## 2012-10-27 NOTE — Progress Notes (Addendum)
47 yo very pleasant female here for dysfunctional uterine bleeding.  She reports she is having a period every 2 weeks for past several months.  Using  a pad or tampon every 2 hours for a week at a time.  In last year and a half,  menses have been Irregular.  Stable chronic fatigue.  No hot flashes.  No dyspareunia.   When she does not have a menstrual cycle, she does not feel breast tenderness of cramping- Likely anovulatory.  Never had issue like this before. Mother was 14 when she stated menopause.   Not recently on OCPs or other birth control.  Nml pap smear with here on 1025/2013.  Nml vaginal exam.   No family history of ovarian cancer, no bleeding disorders..  Patient Active Problem List  Diagnosis  . MORBID OBESITY  . DEPRESSION  . MIGRAINE W/AURA W/O INTRACT W/O STATUS MIGRNOSUS  . ALLERGIC RHINITIS  . THYROMEGALY  . SHELLFISH ALLERGY  . 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 History of HPV infection  . Lyme disease  . DUB (dysfunctional uterine bleeding)   Past Medical History  Diagnosis Date  . Allergy   . Depression   . Migraine    Past Surgical History  Procedure Laterality Date  . Cholecystectomy  2009  . Tubal ligation  1997  . Laparoscopic gastric banding  2008     removed 2009   History  Substance Use Topics  . Smoking status: Former Smoker -- 2.00 packs/day for 20 years    Types: Cigarettes    Quit date: 09/02/2003  . Smokeless tobacco: Not on file  . Alcohol Use: No   Family History  Problem Relation Age of Onset  . Hypertension Mother   . Hyperlipidemia Mother   . HIV Father   . Lung cancer Maternal Grandfather     smoker  . Hepatitis Maternal Uncle     drug use  . Colon cancer Neg Hx   . Colon polyps Neg Hx   . Rectal cancer Neg Hx   . Stomach cancer Neg Hx    Allergies  Allergen Reactions  . Hydrocod Polst-Cpm Polst Er Other (See Comments)    Bad dreams  . Shellfish Allergy     REACTION: throat closing    Current Outpatient Prescriptions on File Prior to Visit  Medication Sig Dispense Refill  . doxycycline (VIBRA-TABS) 100 MG tablet Take 1 tablet (100 mg total) by mouth 2 (two) times daily.  60 tablet  0  . EPINEPHrine (EPIPEN 2-PAK) 0.3 mg/0.3 mL DEVI Inject as directed and then go to ER       . esomeprazole (NEXIUM) 40 MG capsule Take 1 capsule (40 mg total) by mouth daily before breakfast.  180 capsule  2  . etodolac (LODINE) 400 MG tablet Take 400 mg by mouth as needed.      [name] ibuprofen (ADVIL,MOTRIN) 200 MG tablet Take as needed (1-6 tabs)      . meloxicam (MOBIC) 7.5 MG tablet Take 1 tablet (7.5 mg total) by mouth daily.  30 tablet  0  . omeprazole (PRILOSEC) 20 MG capsule Take 1 capsule (20 mg total) by mouth 2 (two) times daily.  60 capsule  1  . PARoxetine (PAXIL) 20 MG tablet TAKE 1 TABLET BY MOUTH EVERY MORNING  30 tablet  6  . traMADol (ULTRAM) 50 MG tablet Take one tablet by mouth every 6 hours as needed for pain.  60 tablet  0   No  current facility-administered medications on file prior to visit.   The PMH, PSH, Social History, Family History, Medications, and allergies have been reviewed in [facility], and have been updated if relevant.  Review of Systems  Constitutional: Negative for fever and fatigue.  HENT: Negative for ear pain.  Eyes: Negative for pain.  Respiratory: Negative for chest tightness and shortness of breath.  Cardiovascular: Negative for chest pain, palpitations and leg swelling.  Gastrointestinal: Negative for abdominal pain.  Genitourinary: Positive for vaginal bleeding and menstrual problem. Negative for dysuria, flank pain, vaginal discharge and pelvic pain.   Objective:     Physical Exam  Constitutional: Vital signs are normal. She appears well-developed and well-nourished. She is cooperative. Non-toxic appearance. She does not appear ill. No distress.  HENT:  Head: Normocephalic.  Right Ear: Hearing, tympanic membrane, external ear and ear canal normal.  Tympanic membrane is not erythematous, not retracted and not bulging.  Left Ear: Hearing, tympanic membrane, external ear and ear canal normal. Tympanic membrane is not erythematous, not retracted and not bulging.  Nose: No mucosal edema or rhinorrhea. Right sinus exhibits no maxillary sinus tenderness and no frontal sinus tenderness. Left sinus exhibits no maxillary sinus tenderness and no frontal sinus tenderness.  Mouth/Throat: Uvula is midline, oropharynx is clear and moist and mucous membranes are normal.  Eyes: Conjunctivae, EOM and lids are normal. Pupils are equal, round, and reactive to light. No foreign bodies found.  Neck: Trachea normal and normal range of motion. Neck supple. Carotid bruit is not present. No mass and no thyromegaly present.  Cardiovascular: Normal rate, regular rhythm, S1 normal, S2 normal, normal heart sounds, intact distal pulses and normal pulses. Exam reveals no gallop and no friction rub.  No murmur heard.  Pulmonary/Chest: Effort normal and breath sounds normal. Not tachypneic. No respiratory distress. She has no decreased breath sounds. She has no wheezes. She has no rhonchi. She has no rales.  Abdominal: Soft. Normal appearance and bowel sounds are normal. There is no tenderness.  Genitourinary: Uterus normal. No labial fusion. There is no rash, tenderness, lesion or injury on the right labia. There is no rash, tenderness, lesion or injury on the left labia. Right adnexum displays no mass, no tenderness and no fullness. Left adnexum displays no mass, no tenderness and no fullness. No erythema, tenderness or bleeding around the vagina. No foreign body around the vagina. No signs of injury around the vagina. No vaginal discharge found.  Neurological: She is alert.  Skin: Skin is warm, dry and intact. No rash noted.  Psychiatric: Her speech is normal and behavior is normal. Judgment and thought content normal. Her mood appears not anxious. Cognition and memory are  normal. She does not exhibit a depressed mood.   Assessment & Plan:    1. DUB (dysfunctional uterine bleeding)  I suspect this is anovulatory bleeding Evaluate for menopause- LH, FSH.  Check TSH.  Eval for fibroid, polyps etc with U/S once bleeding has stopped (in 2 weeks).  Stop bleeding with estrogen /progesterone course.

## 2012-10-27 NOTE — Patient Instructions (Addendum)
Good to see you. We are checking some labs today to see how close you may be to menopause.  We will call you with those results. Take Premarin and Provera as directed.  Stop by to see Shirlee Limerick on your way out to set up your ultrasound in two weeks.

## 2012-10-29 ENCOUNTER — Other Ambulatory Visit: Payer: Self-pay | Admitting: *Deleted

## 2012-10-29 MED ORDER — TRAMADOL HCL 50 MG PO TABS: ORAL_TABLET | ORAL | Status: DC

## 2012-10-29 NOTE — Telephone Encounter (Signed)
Faxed refill request for tramadol, last filled 09/28/12.

## 2012-11-10 ENCOUNTER — Encounter: Payer: Self-pay | Admitting: Family Medicine

## 2012-11-10 ENCOUNTER — Ambulatory Visit: Payer: Self-pay | Admitting: Family Medicine

## 2012-11-12 ENCOUNTER — Other Ambulatory Visit: Payer: Self-pay | Admitting: Family Medicine

## 2012-11-12 DIAGNOSIS — N938 Other specified abnormal uterine and vaginal bleeding: Secondary | ICD-10-CM

## 2012-11-17 ENCOUNTER — Telehealth: Payer: Self-pay | Admitting: Family Medicine

## 2012-11-17 NOTE — Telephone Encounter (Signed)
Ok good.  Please keep [place] updated after her appt.

## 2012-11-17 NOTE — Telephone Encounter (Signed)
Advised patient, she sees gyn on Friday.

## 2012-11-17 NOTE — Telephone Encounter (Signed)
Left message on voice mail advising patient. 

## 2012-11-17 NOTE — Telephone Encounter (Signed)
Pt left vm stating she has been taking hormones for 17 days and has started bleeding today.  She wants to know if this is normal.

## 2012-11-17 NOTE — Telephone Encounter (Signed)
I'm sorry to hear it restarted.   When does she see GYN?

## 2012-11-25 ENCOUNTER — Other Ambulatory Visit: Payer: Self-pay | Admitting: *Deleted

## 2012-11-25 MED ORDER — TRAMADOL HCL 50 MG PO TABS: ORAL_TABLET | ORAL | Status: DC

## 2012-11-25 NOTE — Telephone Encounter (Signed)
Last filled 10/29/12

## 2013-01-04 ENCOUNTER — Other Ambulatory Visit: Payer: Self-pay | Admitting: *Deleted

## 2013-01-04 MED ORDER — TRAMADOL HCL 50 MG PO TABS: ORAL_TABLET | ORAL | Status: DC

## 2013-01-04 NOTE — Telephone Encounter (Signed)
Last filled 11/25/12

## 2013-01-10 NOTE — Telephone Encounter (Signed)
Pt checking on status of Tramadol refill; spoke with Judeth Cornfield at [facility] and they did not receive refill on 01/04/13; gave verbally as instructed. Pt notified done.

## 2013-01-14 ENCOUNTER — Ambulatory Visit (INDEPENDENT_AMBULATORY_CARE_PROVIDER_SITE_OTHER): Payer: Federal, State, Local not specified - PPO | Admitting: Family Medicine

## 2013-01-14 ENCOUNTER — Encounter: Payer: Self-pay | Admitting: Family Medicine

## 2013-01-14 VITALS — BP 110/78 | HR 84 | Temp 97.9°F

## 2013-01-14 DIAGNOSIS — J069 Acute upper respiratory infection, unspecified: Secondary | ICD-10-CM

## 2013-01-14 MED ORDER — AMOXICILLIN 875 MG PO TABS: 875.0000 mg | ORAL_TABLET | Freq: Two times a day (BID) | ORAL | Status: DC

## 2013-01-14 NOTE — Patient Instructions (Signed)
Take Amoxicillin- 1 tablet twice daily x 10 days.Eat Yogurt. Drink lots of fluids.   Treat sympotmatically with Mucinex, nasal saline irrigation, and Tylenol/Ibuprofen.   Also try an antihistamine/decongestant like claritin D or zyrtec D over the counter- two times a day as needed ( have to sign for them at pharmacy).   Try over the counter nasocort-start with 2 sprays per nostril per day...and then try to taper to 1 spray per nostril once symptoms improve.   You can use warm compresses.     Call if not improving as expected in 5-7 days.

## 2013-01-14 NOTE — Progress Notes (Signed)
SUBJECTIVE:  Barbara Faulkner is a 47 y.o. female who complains of coryza, congestion, sneezing, sore throat and bilateral sinus pain for 10 days. She denies a history of anorexia, chest pain, chills and dizziness and denies a history of asthma. Patient denies smoke cigarettes.   Patient Active Problem List   Diagnosis Date Noted  . DUB (dysfunctional uterine bleeding) 10/27/2012  . 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 Lyme disease 06/22/2012  . 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 SHELLFISH ALLERGY 09/27/2010  . THYROMEGALY 09/03/2010  . ALLERGIC RHINITIS 03/15/2010  . MORBID OBESITY 05/17/2008  . DEPRESSION 05/17/2008  . MIGRAINE W/AURA W/O INTRACT W/O STATUS MIGRNOSUS 05/17/2008   Past Medical History  Diagnosis Date  . Allergy   . Depression   . Migraine    Past Surgical History  Procedure Laterality Date  . Cholecystectomy  2009  . Tubal ligation  1997  . Laparoscopic gastric banding  2008     removed 2009   History  Substance Use Topics  . Smoking status: Former Smoker -- 2.00 packs/day for 20 years    Types: Cigarettes    Quit date: 09/02/2003  . Smokeless tobacco: Not on file  . Alcohol Use: No   Family History  Problem Relation Age of Onset  . Hypertension Mother   . Hyperlipidemia Mother   . HIV Father   . Lung cancer Maternal Grandfather     smoker  . Hepatitis Maternal Uncle     drug use  . Colon cancer Neg Hx   . Colon polyps Neg Hx   . Rectal cancer Neg Hx   . Stomach cancer Neg Hx    Allergies  Allergen Reactions  . Hydrocod Polst-Cpm Polst Er Other (See Comments)    Bad dreams  . Shellfish Allergy     REACTION: throat closing   Current Outpatient Prescriptions on File Prior to Visit  Medication Sig Dispense Refill  . desoximetasone (TOPICORT) 0.05 % cream Apply topically 2 (two) times daily.      [name] EPINEPHrine (EPIPEN 2-PAK) 0.3 mg/0.3 mL DEVI Inject as directed and then go to ER       . ibuprofen (ADVIL,MOTRIN)  200 MG tablet Take as needed (1-6 tabs)      . PARoxetine (PAXIL) 20 MG tablet TAKE 1 TABLET BY MOUTH EVERY MORNING  30 tablet  6  . traMADol (ULTRAM) 50 MG tablet Take one tablet by mouth every 6 hours as needed for pain.  60 tablet  0   No current facility-administered medications on file prior to visit.   The PMH, PSH, Social History, Family History, Medications, and allergies have been reviewed in [facility], and have been updated if relevant.  OBJECTIVE: BP 110/78  Pulse 84  Temp(Src) 97.9 F (36.6 C)  She appears well, vital signs are as noted. Ears normal.  Throat and pharynx normal.  Neck supple. No adenopathy in the neck. Nose is congested. Sinuses tender. The chest is clear, without wheezes or rales.  ASSESSMENT:  sinusitis  PLAN: Given duration and progression of symptoms, will treat for bacterial sinusitis. Symptomatic therapy suggested: push fluids, rest and return office visit prn if symptoms persist or worsen.  Call or return to clinic prn if these symptoms worsen or fail to improve as anticipated.

## 2013-01-28 ENCOUNTER — Encounter: Payer: Self-pay | Admitting: Family Medicine

## 2013-02-04 ENCOUNTER — Other Ambulatory Visit: Payer: Self-pay | Admitting: *Deleted

## 2013-02-04 NOTE — Telephone Encounter (Signed)
Faxed refill request from [facility] [address], last filled 01/10/13.

## 2013-02-07 MED ORDER — TRAMADOL HCL 50 MG PO TABS: ORAL_TABLET | ORAL | Status: DC

## 2013-02-07 NOTE — Telephone Encounter (Signed)
Pt checking  On status of refill; spoke with Victorino Dike at [facility] getting rx ready now; pt notified.

## 2013-03-01 ENCOUNTER — Other Ambulatory Visit: Payer: Self-pay | Admitting: *Deleted

## 2013-03-01 MED ORDER — TRAMADOL HCL 50 MG PO TABS: ORAL_TABLET | ORAL | Status: DC

## 2013-03-01 NOTE — Telephone Encounter (Signed)
Last filled 02/07/13

## 2013-03-21 ENCOUNTER — Other Ambulatory Visit: Payer: Self-pay | Admitting: *Deleted

## 2013-03-21 MED ORDER — TRAMADOL HCL 50 MG PO TABS: ORAL_TABLET | ORAL | Status: DC

## 2013-03-21 NOTE — Telephone Encounter (Signed)
Faxed refill request from [facility] [address] for tramadol, last filled 03/01/13 for 60.

## 2013-04-11 ENCOUNTER — Other Ambulatory Visit: Payer: Self-pay | Admitting: *Deleted

## 2013-04-11 MED ORDER — TRAMADOL HCL 50 MG PO TABS: ORAL_TABLET | ORAL | Status: DC

## 2013-04-11 NOTE — Telephone Encounter (Signed)
plz phone in. 

## 2013-04-11 NOTE — Telephone Encounter (Signed)
Faxed refill request for tramadol, last filled 60 on 03/11/13.

## 2013-04-12 NOTE — Telephone Encounter (Signed)
Refill called to [facility]. 

## 2013-04-29 ENCOUNTER — Other Ambulatory Visit: Payer: Self-pay | Admitting: *Deleted

## 2013-05-01 ENCOUNTER — Other Ambulatory Visit: Payer: Self-pay | Admitting: Family Medicine

## 2013-05-03 MED ORDER — TRAMADOL HCL 50 MG PO TABS: ORAL_TABLET | ORAL | Status: DC

## 2013-05-03 NOTE — Telephone Encounter (Signed)
Refill called to [facility]. 

## 2013-05-10 ENCOUNTER — Ambulatory Visit (INDEPENDENT_AMBULATORY_CARE_PROVIDER_SITE_OTHER): Payer: Federal, State, Local not specified - PPO | Admitting: Internal Medicine

## 2013-05-10 ENCOUNTER — Encounter: Payer: Self-pay | Admitting: Internal Medicine

## 2013-05-10 VITALS — BP 124/88 | HR 84 | Temp 98.1°F | Ht 64.0 in | Wt 264.8 lb

## 2013-05-10 DIAGNOSIS — J309 Allergic rhinitis, unspecified: Secondary | ICD-10-CM

## 2013-05-10 NOTE — Patient Instructions (Signed)
Allergic Rhinitis Allergic rhinitis is when the mucous membranes in the nose respond to allergens. Allergens are particles in the air that cause your body to have an allergic reaction. This causes you to release allergic antibodies. Through a chain of events, these eventually cause you to release histamine into the blood stream (hence the use of antihistamines). Although meant to be protective to the body, it is this release that causes your discomfort, such as frequent sneezing, congestion and an itchy runny nose.  CAUSES  The pollen allergens may come from grasses, trees, and weeds. This is seasonal allergic rhinitis, or "hay fever." Other allergens cause year-round allergic rhinitis (perennial allergic rhinitis) such as house dust mite allergen, pet dander and mold spores.  SYMPTOMS   Nasal stuffiness (congestion).  Runny, itchy nose with sneezing and tearing of the eyes.  There is often an itching of the mouth, eyes and ears. It cannot be cured, but it can be controlled with medications. DIAGNOSIS  If you are unable to determine the offending allergen, skin or blood testing may find it. TREATMENT   Avoid the allergen.  Medications and allergy shots (immunotherapy) can help.  Hay fever may often be treated with antihistamines in pill or nasal spray forms. Antihistamines block the effects of histamine. There are over-the-counter medicines that may help with nasal congestion and swelling around the eyes. Check with your caregiver before taking or giving this medicine. If the treatment above does not work, there are many new medications your caregiver can prescribe. Stronger medications may be used if initial measures are ineffective. Desensitizing injections can be used if medications and avoidance fails. Desensitization is when a patient is given ongoing shots until the body becomes less sensitive to the allergen. Make sure you follow up with your caregiver if problems continue. SEEK MEDICAL  CARE IF:   You develop fever (more than 100.5 F (38.1 C).  You develop a cough that does not stop easily (persistent).  You have shortness of breath.  You start wheezing.  Symptoms interfere with normal daily activities. Document Released: 05/13/2001 Document Revised: 11/10/2011 Document Reviewed: 11/22/2008 [facility] Patient Information 2014 [place], [place]. Sinusitis Sinusitis is redness, soreness, and swelling (inflammation) of the paranasal sinuses. Paranasal sinuses are air pockets within the bones of your face (beneath the eyes, the middle of the forehead, or above the eyes). In healthy paranasal sinuses, mucus is able to drain out, and air is able to circulate through them by way of your nose. However, when your paranasal sinuses are inflamed, mucus and air can become trapped. This can allow bacteria and other germs to grow and cause infection. Sinusitis can develop quickly and last only a short time (acute) or continue over a long period (chronic). Sinusitis that lasts for more than 12 weeks is considered chronic.  CAUSES  Causes of sinusitis include:  Allergies.  Structural abnormalities, such as displacement of the cartilage that separates your nostrils (deviated septum), which can decrease the air flow through your nose and sinuses and affect sinus drainage.  Functional abnormalities, such as when the small hairs (cilia) that line your sinuses and help remove mucus do not work properly or are not present. SYMPTOMS  Symptoms of acute and chronic sinusitis are the same. The primary symptoms are pain and pressure around the affected sinuses. Other symptoms include:  Upper toothache.  Earache.  Headache.  Bad breath.  Decreased sense of smell and taste.  A cough, which worsens when you are lying flat.  Fatigue.  Fever.  Thick drainage from your nose, which often is green and may contain pus (purulent).  Swelling and warmth over the affected sinuses. DIAGNOSIS    Your caregiver will perform a physical exam. During the exam, your caregiver may:  Look in your nose for signs of abnormal growths in your nostrils (nasal polyps).  Tap over the affected sinus to check for signs of infection.  View the inside of your sinuses (endoscopy) with a special imaging device with a light attached (endoscope), which is inserted into your sinuses. If your caregiver suspects that you have chronic sinusitis, one or more of the following tests may be recommended:  Allergy tests.  Nasal culture A sample of mucus is taken from your nose and sent to a lab and screened for bacteria.  Nasal cytology A sample of mucus is taken from your nose and examined by your caregiver to determine if your sinusitis is related to an allergy. TREATMENT  Most cases of acute sinusitis are related to a viral infection and will resolve on their own within 10 days. Sometimes medicines are prescribed to help relieve symptoms (pain medicine, decongestants, nasal steroid sprays, or saline sprays).  However, for sinusitis related to a bacterial infection, your caregiver will prescribe antibiotic medicines. These are medicines that will help kill the bacteria causing the infection.  Rarely, sinusitis is caused by a fungal infection. In theses cases, your caregiver will prescribe antifungal medicine. For some cases of chronic sinusitis, surgery is needed. Generally, these are cases in which sinusitis recurs more than 3 times per year, despite other treatments. HOME CARE INSTRUCTIONS   Drink plenty of water. Water helps thin the mucus so your sinuses can drain more easily.  Use a humidifier.  Inhale steam 3 to 4 times a day (for example, sit in the bathroom with the shower running).  Apply a warm, moist washcloth to your face 3 to 4 times a day, or as directed by your caregiver.  Use saline nasal sprays to help moisten and clean your sinuses.  Take over-the-counter or prescription medicines for  pain, discomfort, or fever only as directed by your caregiver. SEEK IMMEDIATE MEDICAL CARE IF:  You have increasing pain or severe headaches.  You have nausea, vomiting, or drowsiness.  You have swelling around your face.  You have vision problems.  You have a stiff neck.  You have difficulty breathing. MAKE SURE YOU:   Understand these instructions.  Will watch your condition.  Will get help right away if you are not doing well or get worse. Document Released: 08/18/2005 Document Revised: 11/10/2011 Document Reviewed: 09/02/2011 [facility] Patient Information 2014 [place], [place].

## 2013-05-10 NOTE — Progress Notes (Signed)
HPI  Pt presents to the clinic today with c/o headache, nasal congestion and pain in her teeth. This started last night. She has not taken anything OTC for this.  She does have a history of allergies and recurrent sinus infections, last one treated in May 2014. She is taking a OTC generic antihistamine for her allergies. She is also taking Nasocort. She has never been seen by an allergist before. She is not a current smoker. She denies fever, chills, body aches or sick contacts.   Review of Systems    Past Medical History  Diagnosis Date  . Allergy   . Depression   . Migraine     Family History  Problem Relation Age of Onset  . Hypertension Mother   . Hyperlipidemia Mother   . HIV Father   . Lung cancer Maternal Grandfather     smoker  . Hepatitis Maternal Uncle     drug use  . Colon cancer Neg Hx   . Colon polyps Neg Hx   . Rectal cancer Neg Hx   . Stomach cancer Neg Hx     History   Social History  . Marital Status: Married    Spouse Name: N/A    Number of Children: 2  . Years of Education: N/A   Occupational History  . [employer], Programmer, applications    Social History Main Topics  . Smoking status: Former Smoker -- 2.00 packs/day for 20 years    Types: Cigarettes    Quit date: 09/02/2003  . Smokeless tobacco: Never Used  . Alcohol Use: No  . Drug Use: No  . Sexual Activity: Not on file   Other Topics Concern  . Not on file   Social History Narrative  . No narrative on file    Allergies  Allergen Reactions  . Hydrocod Polst-Cpm Polst Er Other (See Comments)    Bad dreams  . Shellfish Allergy     REACTION: throat closing     Constitutional: Positive headache, fatigue. Denies fever or abrupt weight changes.  HEENT:  Positive facial pain, nasal congestion and sore throat. Denies eye redness, ear pain, ringing in the ears, wax buildup, runny nose or bloody nose. Respiratory:  Denies cough, difficulty breathing or shortness of breath.  Cardiovascular: Denies  chest pain, chest tightness, palpitations or swelling in the hands or feet.   No other specific complaints in a complete review of systems (except as listed in HPI above).  Objective:    BP 124/88  Pulse 84  Temp(Src) 98.1 F (36.7 C) (Oral)  Ht 5\' 4"  (1.626 m)  Wt 264 lb 12 oz (120.09 kg)  BMI 45.42 kg/m2  LMP 02/07/2013 Wt Readings from Last 3 Encounters:  05/10/13 264 lb 12 oz (120.09 kg)  10/27/12 276 lb (125.193 kg)  06/22/12 274 lb (124.286 kg)    General: Appears her stated age, obese but well developed, well nourished in NAD. HEENT: Head: normal shape and size; Eyes: sclera white, no icterus, conjunctiva pink, PERRLA and EOMs intact; Ears: Tm's gray and intact, normal light reflex; Nose: mucosa pink and moist, septum midline; Throat/Mouth: + PND. Teeth present, mucosa pink and moist, no exudate noted, no lesions or ulcerations noted.  Neck: Neck supple, trachea midline. No massses, lumps or thyromegaly present.  Cardiovascular: Normal rate and rhythm. S1,S2 noted.  No murmur, rubs or gallops noted. No JVD or BLE edema. No carotid bruits noted. Pulmonary/Chest: Normal effort and positive vesicular breath sounds. No respiratory distress. No wheezes, rales  or ronchi noted.      Assessment & Plan:   Allergic rhinitis, recurrent:  Can use a Neti Pot which can be purchased from your local drug store. Continue OTC antihistamine (2 times daily for the next 7 days) and Nasocort Take Sudafed as directed for the next 3 days Mucinex as directed for the next 3-5 days with lots of water   RTC as needed or if symptoms persist or worsen or if you develop fever, vomiting or diarrhea.

## 2013-05-31 ENCOUNTER — Other Ambulatory Visit: Payer: Self-pay | Admitting: Family Medicine

## 2013-05-31 NOTE — Telephone Encounter (Signed)
Tramadol phoned in to pharmacy. 

## 2013-06-28 ENCOUNTER — Other Ambulatory Visit: Payer: Self-pay | Admitting: Family Medicine

## 2013-06-28 NOTE — Telephone Encounter (Signed)
Ok to refill on or after 06/30/2013

## 2013-06-28 NOTE — Telephone Encounter (Signed)
Pharmacy notified as instructed by telephone. 

## 2013-06-28 NOTE — Telephone Encounter (Signed)
Received refill request electronically. Last office visit 05/10/13/acute visit.See allergy/contraindication.  Is it okay to refill medication?

## 2013-06-29 ENCOUNTER — Telehealth: Payer: Self-pay | Admitting: Family Medicine

## 2013-06-29 NOTE — Telephone Encounter (Signed)
Patient Information:  Caller Name: [name]  Phone: [phone]  Patient: Barbara Faulkner Age  Gender: Female  DOB: 1965-12-24  Age: 47 Years  PCP: Ruthe Mannan [facility])  Pregnant: No  Office Follow Up:  Does the office need to follow up with this patient?: No  Instructions For The Office: N/A  RN Note:  Diarrhea, exposed to GI Virus from [facility]. Home care advice given.  Advised Pt to call back if sxs worsen or didn't improve after 7 days of home care.  Symptoms  Reason For Call & Symptoms: Diarrhea, exposed to GI Virus from [facility].  Reviewed Health History In EMR: Yes  Reviewed Medications In EMR: Yes  Reviewed Allergies In EMR: Yes  Reviewed Surgeries / Procedures: Yes  Date of Onset of Symptoms: 06/29/2013  Treatments Tried: pepto  Treatments Tried Worked: No OB / GYN:  LMP: 06/22/2013  Guideline(s) Used:  Diarrhea  Disposition Per Guideline:   Home Care  Reason For Disposition Reached:   Mild diarrhea  Advice Given:  Reassurance:  In healthy adults, new-onset diarrhea is usually caused by a viral infection of the intestines, which you can treat at home. Diarrhea is the body's way of getting rid of the infection. Here are some tips on how to keep ahead of the fluid losses.  Fluids:  Drink more fluids, at least 8-10 glasses (8 oz or 240 ml) daily.  For example: sports drinks, diluted fruit juices, soft drinks.  Supplement this with saltine crackers or soups to make certain that you are getting sufficient fluid and salt to meet your body's needs.  Avoid caffeinated beverages (Reason: caffeine is mildly dehydrating).  Diarrhea Medication  - Imodium AD:   Helps reduce diarrhea.  Adult dosage: 4 mg (2 capsules or 4 teaspoons or 20 ml) is the recommended first dose. You may take an additional 2 mg (1 capsule or 2 teaspoons or 10 ml) after each loose BM.  Maximum dosage: 16 mg (8 capsules or 16 teaspoons or 80 ml).  1 capsule = 2 mg; 1 teaspoon (5 ml) = 1 mg.  Caution: Do  not use if you have a fever greater than 100F (37.8C). Do not use if there is blood or mucus in your stools. Do not use for more than 2 days.  Read all package instructions.  Expected Course:  Viral diarrhea lasts 4-7 days. Always worse on days 1 and 2.  Call Back If:  Signs of dehydration occur (e.g., no urine for more than 12 hours, very dry mouth, lightheaded, etc.)  Diarrhea lasts over 7 days  You become worse.  Patient Will Follow Care Advice:  YES

## 2013-06-29 NOTE — Telephone Encounter (Signed)
Noted, OV if diarrhea continues

## 2013-07-25 ENCOUNTER — Other Ambulatory Visit: Payer: Self-pay | Admitting: Family Medicine

## 2013-07-25 NOTE — Telephone Encounter (Signed)
Electronic refill request.  Please advise. 

## 2013-07-26 ENCOUNTER — Other Ambulatory Visit: Payer: Self-pay | Admitting: Internal Medicine

## 2013-07-26 NOTE — Telephone Encounter (Signed)
Ok to phone in ultram 

## 2013-07-26 NOTE — Telephone Encounter (Signed)
Medication phoned to pharmacy.  

## 2013-08-26 ENCOUNTER — Other Ambulatory Visit: Payer: Self-pay | Admitting: Internal Medicine

## 2013-08-26 NOTE — Telephone Encounter (Signed)
Last filled 07/25/2013 and last OV 05/10/2013--please advise

## 2013-08-29 ENCOUNTER — Other Ambulatory Visit: Payer: Self-pay | Admitting: Internal Medicine

## 2013-08-29 DIAGNOSIS — Z0000 Encounter for general adult medical examination without abnormal findings: Secondary | ICD-10-CM

## 2013-08-29 NOTE — Telephone Encounter (Signed)
Ok to phone in tramadol 

## 2013-08-29 NOTE — Telephone Encounter (Signed)
Rx phoned into pharmacy as requested.  

## 2013-09-06 ENCOUNTER — Other Ambulatory Visit (INDEPENDENT_AMBULATORY_CARE_PROVIDER_SITE_OTHER): Payer: Federal, State, Local not specified - PPO

## 2013-09-06 DIAGNOSIS — Z0000 Encounter for general adult medical examination without abnormal findings: Secondary | ICD-10-CM

## 2013-09-06 LAB — LIPID PANEL
Cholesterol: 131 mg/dL (ref 0–200)
HDL: 34.6 mg/dL — AB (ref >39.00)
LDL Cholesterol: 86 mg/dL (ref 0–99)
Total CHOL/HDL Ratio: 4
Triglycerides: 50 mg/dL (ref 0.0–149.0)
VLDL: 10 mg/dL (ref 0.0–40.0)

## 2013-09-06 LAB — COMPREHENSIVE METABOLIC PANEL
ALBUMIN: 3.8 g/dL (ref 3.5–5.2)
ALT: 15 U/L (ref 0–35)
AST: 18 U/L (ref 0–37)
Alkaline Phosphatase: 59 U/L (ref 39–117)
BUN: 11 mg/dL (ref 6–23)
CO2: 23 mEq/L (ref 19–32)
Calcium: 8.5 mg/dL (ref 8.4–10.5)
Chloride: 109 mEq/L (ref 96–112)
Creatinine, Ser: 0.9 mg/dL (ref 0.4–1.2)
GFR: 91.67 mL/min (ref >60.00)
Glucose, Bld: 91 mg/dL (ref 70–99)
POTASSIUM: 3.8 meq/L (ref 3.5–5.1)
Sodium: 139 mEq/L (ref 135–145)
Total Bilirubin: 0.6 mg/dL (ref 0.3–1.2)
Total Protein: 6.4 g/dL (ref 6.0–8.3)

## 2013-09-06 LAB — CBC
HCT: 36 % (ref 36.0–46.0)
Hemoglobin: 11.5 g/dL — ABNORMAL LOW (ref 12.0–15.0)
MCHC: 32 g/dL (ref 30.0–36.0)
MCV: 85.4 fl (ref 78.0–100.0)
PLATELETS: 213 10*3/uL (ref 150.0–400.0)
RBC: 4.21 Mil/uL (ref 3.87–5.11)
RDW: 14.9 % — AB (ref 11.5–14.6)
WBC: 11.3 10*3/uL — ABNORMAL HIGH (ref 4.5–10.5)

## 2013-09-06 LAB — TSH: TSH: 1.4 u[IU]/mL (ref 0.35–5.50)

## 2013-09-13 ENCOUNTER — Other Ambulatory Visit (HOSPITAL_COMMUNITY)
Admission: RE | Admit: 2013-09-13 | Discharge: 2013-09-13 | Disposition: A | Discharge status: Home / Self Care | Payer: Federal, State, Local not specified - PPO | Source: Ambulatory Visit | Attending: Family Medicine | Admitting: Family Medicine

## 2013-09-13 ENCOUNTER — Ambulatory Visit (INDEPENDENT_AMBULATORY_CARE_PROVIDER_SITE_OTHER): Payer: Federal, State, Local not specified - PPO | Admitting: Family Medicine

## 2013-09-13 ENCOUNTER — Encounter: Payer: Self-pay | Admitting: Family Medicine

## 2013-09-13 VITALS — BP 128/84 | HR 70 | Temp 97.6°F | Ht 63.75 in | Wt 265.8 lb

## 2013-09-13 DIAGNOSIS — Z0000 Encounter for general adult medical examination without abnormal findings: Secondary | ICD-10-CM | POA: Insufficient documentation

## 2013-09-13 DIAGNOSIS — Z124 Encounter for screening for malignant neoplasm of cervix: Secondary | ICD-10-CM | POA: Insufficient documentation

## 2013-09-13 DIAGNOSIS — Z1231 Encounter for screening mammogram for malignant neoplasm of breast: Secondary | ICD-10-CM

## 2013-09-13 DIAGNOSIS — L4050 Arthropathic psoriasis, unspecified: Secondary | ICD-10-CM | POA: Insufficient documentation

## 2013-09-13 NOTE — Patient Instructions (Signed)
Great to see you. Please call [facility] to set up your mammogram.  We will call you with your pap smear results.

## 2013-09-13 NOTE — Progress Notes (Signed)
48 yo here for CPX.  Well woman- G2P2 s/p BTL. Last pap smear was 2013, normal.  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  No vaginal discharge. Due for mammogram.   No FH of cervical, breast, or uterine CA. No FH of HLD or CAD.   Psoriatic arthtitis- on MTX which was helping initially.  Now having more hand and knee pain.  Has follow up with Dr. Gavin Potters in March.  Obesity- going to a weight loss seminar at [facility] later this month.  She is excited to hear about her options.   Wt Readings from Last 3 Encounters:  09/13/13 265 lb 12 oz (120.543 kg)  05/10/13 264 lb 12 oz (120.09 kg)  10/27/12 276 lb (125.193 kg)   Lab Results  Component Value Date   WBC 11.3* 09/06/2013   HGB 11.5* 09/06/2013   HCT 36.0 09/06/2013   MCV 85.4 09/06/2013   PLT 213.0 09/06/2013   Lab Results  Component Value Date   CHOL 131 09/06/2013   HDL 34.60* 09/06/2013   LDLCALC 86 09/06/2013   TRIG 50.0 09/06/2013   CHOLHDL 4 09/06/2013    Patient Active Problem List   Diagnosis Date Noted  . Routine general medical examination at a health care facility 09/13/2013  . DUB (dysfunctional uterine bleeding) 10/27/2012  . 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 Lyme disease 06/22/2012  . 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 SHELLFISH ALLERGY 09/27/2010  . THYROMEGALY 09/03/2010  . ALLERGIC RHINITIS 03/15/2010  . MORBID OBESITY 05/17/2008  . DEPRESSION 05/17/2008  . MIGRAINE W/AURA W/O INTRACT W/O STATUS MIGRNOSUS 05/17/2008   Past Medical History  Diagnosis Date  . Allergy   . Depression   . Migraine    Past Surgical History  Procedure Laterality Date  . Cholecystectomy  2009  . Tubal ligation  1997  . Laparoscopic gastric banding  2008     removed 2009   History  Substance Use Topics  . Smoking status: Former Smoker -- 2.00 packs/day for 20 years    Types: Cigarettes    Quit date: 09/02/2003  . Smokeless tobacco: Never Used  . Alcohol Use: No   Family History  Problem Relation Age of Onset   . Hypertension Mother   . Hyperlipidemia Mother   . HIV Father   . Lung cancer Maternal Grandfather     smoker  . Hepatitis Maternal Uncle     drug use  . Colon cancer Neg Hx   . Colon polyps Neg Hx   . Rectal cancer Neg Hx   . Stomach cancer Neg Hx    Allergies  Allergen Reactions  . Hydrocod Polst-Cpm Polst Er Other (See Comments)    Bad dreams  . Shellfish Allergy     REACTION: throat closing   Current Outpatient Prescriptions on File Prior to Visit  Medication Sig Dispense Refill  . desoximetasone (TOPICORT) 0.05 % cream Apply topically 2 (two) times daily.      [name] EPINEPHrine (EPIPEN 2-PAK) 0.3 mg/0.3 mL DEVI Inject as directed and then go to ER       . folic acid (FOLVITE) 1 MG tablet Take 1 mg by mouth daily.      [name] ibuprofen (ADVIL,MOTRIN) 200 MG tablet Take as needed (1-6 tabs)      . methotrexate (RHEUMATREX) 2.5 MG tablet Caution:Chemotherapy. Protect from light. Take 6 by mouth once a week      . PARoxetine (PAXIL) 20 MG tablet TAKE 1 TABLET BY MOUTH EVERY  MORNING  90 tablet  0  . traMADol (ULTRAM) 50 MG tablet TAKE 1 TABLET BY MOUTH EVERY 6 HOURS AS NEEDED FOR PAIN  60 tablet  0   No current facility-administered medications on file prior to visit.   The PMH, PSH, Social History, Family History, Medications, and allergies [facility], and have been updated if relevant.    Review of Systems  See HPI  Patient reports no  vision/ hearing changes,anorexia, weight change, fever ,adenopathy, persistant / recurrent hoarseness, swallowing issues, chest pain, edema,persistant / recurrent cough, hemoptysis, dyspnea(rest, exertional, paroxysmal nocturnal), gastrointestinal  bleeding (melena, rectal bleeding), abdominal pain, excessive heart burn, GU symptoms(dysuria, hematuria, pyuria, voiding/incontinence  Issues) syncope, focal weakness, severe memory loss,  abnormal bruising/bleeding, major joint swelling, breast masses or abnormal vaginal bleeding.     Physical exam: BP 128/84  Pulse 70  Temp(Src) 97.6 F (36.4 C) (Oral)  Ht 5' 3.75" (1.619 m)  Wt 265 lb 12 oz (120.543 kg)  BMI 45.99 kg/m2  SpO2 97%  LMP 09/08/2013  General:  Obese, ,well-nourished,in no acute distress; alert,appropriate and cooperative throughout examination Head:  normocephalic and atraumatic.   Eyes:  vision grossly intact, pupils equal, pupils round, and pupils reactive to light.   Ears:  R ear normal and L ear normal.   Nose:  no external deformity.   Mouth:  good dentition.   Neck:  No deformities, masses, or tenderness noted. Breasts:  No mass, nodules, thickening, tenderness, bulging, retraction, inflamation, nipple discharge or skin changes noted.   Lungs:  Normal respiratory effort, chest expands symmetrically. Lungs are clear to auscultation, no crackles or wheezes. Heart:  Normal rate and regular rhythm. S1 and S2 normal without gallop, murmur, click, rub or other extra sounds. Abdomen:  Bowel sounds positive,abdomen soft and non-tender without masses, organomegaly or hernias noted. Rectal:  no external abnormalities.   Genitalia:  Pelvic Exam:        External: normal female genitalia without lesions or masses        Vagina: normal without lesions or masses        Cervix: normal without lesions or masses        Adnexa: normal bimanual exam without masses or fullness        Uterus: normal by palpation        Pap smear: performed Msk:  No deformity or scoliosis noted of thoracic or lumbar spine.   Extremities:  No clubbing, cyanosis, edema, or deformity noted with normal full range of motion of all joints.   Neurologic:  alert & oriented X3 and gait normal.   Skin:  +plaques on knees and elbows Cervical Nodes:  No lymphadenopathy noted Axillary Nodes:  No palpable lymphadenopathy Psych:  Cognition and judgment appear intact. Alert and cooperative with normal attention span and concentration. No apparent delusions, illusions,  hallucinations  Assessment and Plan:

## 2013-09-13 NOTE — Assessment & Plan Note (Signed)
Going to bariatric surgery seminar.

## 2013-09-13 NOTE — Assessment & Plan Note (Signed)
Reviewed preventive care protocols, scheduled due services, and updated immunizations Discussed nutrition, exercise, diet, and healthy lifestyle.  Pap smear today. 

## 2013-09-13 NOTE — Progress Notes (Signed)
Pre-visit discussion using our clinic review tool. No additional management support is needed unless otherwise documented below in the visit note.  

## 2013-09-13 NOTE — Assessment & Plan Note (Signed)
On MTX. May need to consider biological. Follow up with Rheum.

## 2013-09-15 ENCOUNTER — Ambulatory Visit: Payer: Self-pay | Admitting: Family Medicine

## 2013-09-16 ENCOUNTER — Encounter: Payer: Self-pay | Admitting: *Deleted

## 2013-09-16 ENCOUNTER — Encounter: Payer: Self-pay | Admitting: Family Medicine

## 2013-09-20 ENCOUNTER — Ambulatory Visit (INDEPENDENT_AMBULATORY_CARE_PROVIDER_SITE_OTHER): Payer: Federal, State, Local not specified - PPO | Admitting: Internal Medicine

## 2013-09-20 ENCOUNTER — Encounter: Payer: Self-pay | Admitting: Internal Medicine

## 2013-09-20 ENCOUNTER — Telehealth: Payer: Self-pay | Admitting: Family Medicine

## 2013-09-20 VITALS — BP 120/84 | HR 103 | Temp 98.2°F | Wt 269.5 lb

## 2013-09-20 DIAGNOSIS — J019 Acute sinusitis, unspecified: Secondary | ICD-10-CM

## 2013-09-20 MED ORDER — CEFUROXIME AXETIL 500 MG PO TABS: 500.0000 mg | ORAL_TABLET | Freq: Two times a day (BID) | ORAL | Status: DC

## 2013-09-20 NOTE — Progress Notes (Signed)
HPI: Pt presents today with complaints of dry cough, headache, and chest congestion. Symptoms started 5 days ago. Pt endorses bilateral ear fullness, sinus tenderness, nasal congestion and discharge, and sore throat. Pt denies fever, chills, or body aches. Pt tried Nyquil and Mucinex-D, with some relief. Pt currently taking methotrexate and concerned about possible infection. She denies any sick contacts.    Review of Systems    Past Medical History  Diagnosis Date  . Allergy   . Depression   . Migraine     Family History  Problem Relation Age of Onset  . Hypertension Mother   . Hyperlipidemia Mother   . HIV Father   . Lung cancer Maternal Grandfather     smoker  . Hepatitis Maternal Uncle     drug use  . Colon cancer Neg Hx   . Colon polyps Neg Hx   . Rectal cancer Neg Hx   . Stomach cancer Neg Hx     History   Social History  . Marital Status: Married    Spouse Name: N/A    Number of Children: 2  . Years of Education: N/A   Occupational History  . Post Office, Programmer, [employer]    Social History Main Topics  . Smoking status: Former Smoker -- 2.00 packs/day for 20 years    Types: Cigarettes    Quit date: 09/02/2003  . Smokeless tobacco: Never Used  . Alcohol Use: No  . Drug Use: No  . Sexual Activity: Not on file   Other Topics Concern  . Not on file   Social History Narrative  . No narrative on file    Allergies  Allergen Reactions  . Hydrocod Polst-Cpm Polst Er Other (See Comments)    Bad dreams  . Shellfish Allergy     REACTION: throat closing     Constitutional: Positive headache, fatigue. Denies abrupt weight changes and fever.  HEENT:  Positive eye pain, pressure behind the eyes, facial pain, nasal congestion and sore throat. Denies eye redness, ear pain, ringing in the ears, wax buildup, runny nose or bloody nose. Respiratory: Positive cough and  sputum production. Denies difficulty breathing or shortness of breath.  Cardiovascular: Denies chest  pain, chest tightness, palpitations or swelling in the hands or feet.   No other specific complaints in a complete review of systems (except as listed in HPI above).  Objective:    BP 120/84  Pulse 103  Temp(Src) 98.2 F (36.8 C) (Oral)  Wt 269 lb 8 oz (122.244 kg)  SpO2 98%  LMP 09/08/2013 Wt Readings from Last 3 Encounters:  09/20/13 269 lb 8 oz (122.244 kg)  09/13/13 265 lb 12 oz (120.543 kg)  05/10/13 264 lb 12 oz (120.09 kg)    General: Appears his stated age, well developed, well nourished in NAD. HEENT: Head: normal shape and size; Eyes: sclera white, no icterus, conjunctiva pink, PERRLA and EOMs intact; Ears: Tm's gray and intact, normal light reflex; Nose: mucosa pink and moist, septum midline, bilateral maxillary tenderness.; Throat/Mouth: + PND. Teeth present, mucosa pink and moist, no exudate noted, no lesions or ulcerations noted.  Neck: Mild cervical lymphadenopathy. Neck supple, trachea midline. No massses, lumps or thyromegaly present.  Cardiovascular: Normal rate and rhythm. S1,S2 noted.  No murmur, rubs or gallops noted. No JVD or BLE edema. No carotid bruits noted. Pulmonary/Chest: Normal effort and positive vesicular breath sounds. No respiratory distress. No wheezes, rales or ronchi noted.      Assessment & Plan:   Acute  bacterial sinusitis  Can use a Neti Pot which can be purchased from your local drug store. Ceftin 500mg  po BID 10 days  RTC as needed or if symptoms persist.  Lavanna Rog, Jacques Earthly, Student-NP

## 2013-09-20 NOTE — Telephone Encounter (Signed)
Pt is currently on Methotrxate and works with the public. 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

## 2013-09-20 NOTE — Patient Instructions (Signed)
Sinus Headache °A sinus headache is when your sinuses become clogged or swollen. Sinus headaches can range from mild to severe.  °CAUSES °A sinus headache can have different causes, such as: °· Colds. °· Sinus infections. °· Allergies. °SYMPTOMS  °Symptoms of a sinus headache may vary and can include: °· Headache. °· Pain or pressure in the face. °· Congested or runny nose. °· Fever. °· Inability to smell. °· Pain in upper teeth. °Weather changes can make symptoms worse. °TREATMENT  °The treatment of a sinus headache depends on the cause. °· Sinus pain caused by a sinus infection may be treated with antibiotic medicine. °· Sinus pain caused by allergies may be helped by allergy medicines (antihistamines) and medicated nasal sprays. °· Sinus pain caused by congestion may be helped by flushing the nose and sinuses with saline solution. °HOME CARE INSTRUCTIONS  °· If antibiotics are prescribed, take them as directed. Finish them even if you start to feel better. °· Only take over-the-counter or prescription medicines for pain, discomfort, or fever as directed by your caregiver. °· If you have congestion, use a nasal spray to help reduce pressure. °SEEK IMMEDIATE MEDICAL CARE IF: °· You have a fever. °· You have headaches more than once a week. °· You have sensitivity to light or sound. °· You have repeated nausea and vomiting. °· You have vision problems. °· You have sudden, severe pain in your face or head. °· You have a seizure. °· You are confused. °· Your sinus headaches do not get better after treatment. Many people think they have a sinus headache when they actually have migraines or tension headaches. °MAKE SURE YOU:  °· Understand these instructions. °· Will watch your condition. °· Will get help right away if you are not doing well or get worse. °Document Released: 09/25/2004 Document Revised: 11/10/2011 Document Reviewed: 11/16/2010 °ExitCare® Patient Information ©2014 [employer]. ° °

## 2013-09-20 NOTE — Telephone Encounter (Signed)
Spoke to pt and advised per Dr Aron.  

## 2013-09-20 NOTE — Progress Notes (Signed)
Pre-visit discussion using our clinic review tool. No additional management support is needed unless otherwise documented below in the visit note.  

## 2013-09-20 NOTE — Telephone Encounter (Signed)
No I dont think that is necessary unless her white blood cell count is very low which it is not, but she should check with her rheumatologist to be sure.  Lab Results  Component Value Date   WBC 11.3* 09/06/2013   HGB 11.5* 09/06/2013   HCT 36.0 09/06/2013   MCV 85.4 09/06/2013   PLT 213.0 09/06/2013

## 2013-09-20 NOTE — Progress Notes (Signed)
HPI  Pt presents to the clinic today with c/o cough, sore throat, chest congestion, facial pressure, ear fullness and headache. This started about 5 days ago. The cough is non productive. She is blowing thick mucous out of her nose. She denies fever, chills or body aches. She has tried Mucinex and Nyquil without much relief. She does have a history of allergies. She has had sick contacts.  Review of Systems    Past Medical History  Diagnosis Date  . Allergy   . Depression   . Migraine     Family History  Problem Relation Age of Onset  . Hypertension Mother   . Hyperlipidemia Mother   . HIV Father   . Lung cancer Maternal Grandfather     smoker  . Hepatitis Maternal Uncle     drug use  . Colon cancer Neg Hx   . Colon polyps Neg Hx   . Rectal cancer Neg Hx   . Stomach cancer Neg Hx     History   Social History  . Marital Status: Married    Spouse Name: N/A    Number of Children: 2  . Years of Education: N/A   Occupational History  . [employer], Programmer, applications    Social History Main Topics  . Smoking status: Former Smoker -- 2.00 packs/day for 20 years    Types: Cigarettes    Quit date: 09/02/2003  . Smokeless tobacco: Never Used  . Alcohol Use: No  . Drug Use: No  . Sexual Activity: Not on file   Other Topics Concern  . Not on file   Social History Narrative  . No narrative on file    Allergies  Allergen Reactions  . Hydrocod Polst-Cpm Polst Er Other (See Comments)    Bad dreams  . Shellfish Allergy     REACTION: throat closing     Constitutional: Positive headache, fatigue. Denies fever or abrupt weight changes.  HEENT:  Positive eye pain, pressure behind the eyes, facial pain, nasal congestion and sore throat. Denies eye redness, ear pain, ringing in the ears, wax buildup, runny nose or bloody nose. Respiratory: Positive cough and thick green sputum production. Denies difficulty breathing or shortness of breath.  Cardiovascular: Denies chest pain,  chest tightness, palpitations or swelling in the hands or feet.   No other specific complaints in a complete review of systems (except as listed in HPI above).  Objective:    BP 120/84  Pulse 103  Temp(Src) 98.2 F (36.8 C) (Oral)  Wt 269 lb 8 oz (122.244 kg)  SpO2 98%  LMP 09/08/2013 Wt Readings from Last 3 Encounters:  09/20/13 269 lb 8 oz (122.244 kg)  09/13/13 265 lb 12 oz (120.543 kg)  05/10/13 264 lb 12 oz (120.09 kg)    General: Appears her stated age, well developed, well nourished in NAD. HEENT: Head: normal shape and size, bilateral maxillary sinus tenderness; Eyes: sclera white, no icterus, conjunctiva pink, PERRLA and EOMs intact; Ears: Tm's gray and intact, normal light reflex; Nose: mucosa pink and moist, septum midline; Throat/Mouth: + PND. Teeth present, mucosa pink and moist, no exudate noted, no lesions or ulcerations noted.  Neck: Mild cervical lymphadenopathy. Neck supple, trachea midline. No massses, lumps or thyromegaly present.  Cardiovascular: Normal rate and rhythm. S1,S2 noted.  No murmur, rubs or gallops noted. No JVD or BLE edema. No carotid bruits noted. Pulmonary/Chest: Normal effort and positive vesicular breath sounds. No respiratory distress. No wheezes, rales or ronchi noted.  Assessment & Plan:   Acute sinusitis  Try an allergy pill OTC Given the fact that her symptoms are worsening and she is on methotrexate, I will give eRx forCeftin BID for 10 days  RTC as needed or if symptoms persist.

## 2013-09-29 ENCOUNTER — Other Ambulatory Visit: Payer: Self-pay

## 2013-09-29 MED ORDER — TRAMADOL HCL 50 MG PO TABS: ORAL_TABLET | ORAL | Status: DC

## 2013-09-29 NOTE — Telephone Encounter (Signed)
Last filled 08/26/13--please advise

## 2013-10-03 NOTE — Telephone Encounter (Signed)
Rx called in to pharmacy. 

## 2013-10-04 ENCOUNTER — Ambulatory Visit: Payer: Self-pay | Admitting: Bariatrics

## 2013-10-04 LAB — COMPREHENSIVE METABOLIC PANEL
ANION GAP: 1 — AB (ref 7–16)
Albumin: 3.6 g/dL (ref 3.4–5.0)
Alkaline Phosphatase: 71 U/L
BILIRUBIN TOTAL: 0.3 mg/dL (ref 0.2–1.0)
BUN: 15 mg/dL (ref 7–18)
CHLORIDE: 112 mmol/L — AB (ref 98–107)
CO2: 25 mmol/L (ref 21–32)
CREATININE: 0.9 mg/dL (ref 0.60–1.30)
Calcium, Total: 8.5 mg/dL (ref 8.5–10.1)
EGFR (African American): >60
EGFR (Non-African Amer.): >60
GLUCOSE: 96 mg/dL (ref 65–99)
Osmolality: 276 (ref 275–301)
Potassium: 4 mmol/L (ref 3.5–5.1)
SGOT(AST): 23 U/L (ref 15–37)
SGPT (ALT): 16 U/L (ref 12–78)
SODIUM: 138 mmol/L (ref 136–145)
TOTAL PROTEIN: 6.8 g/dL (ref 6.4–8.2)

## 2013-10-04 LAB — CBC WITH DIFFERENTIAL/PLATELET
Basophil #: 0.1 10*3/uL (ref 0.0–0.1)
Basophil %: 0.5 %
EOS PCT: 2.4 %
Eosinophil #: 0.3 10*3/uL (ref 0.0–0.7)
HCT: 36 % (ref 35.0–47.0)
HGB: 11.6 g/dL — ABNORMAL LOW (ref 12.0–16.0)
LYMPHS PCT: 21.4 %
Lymphocyte #: 2.4 10*3/uL (ref 1.0–3.6)
MCH: 27.9 pg (ref 26.0–34.0)
MCHC: 32.2 g/dL (ref 32.0–36.0)
MCV: 87 fL (ref 80–100)
Monocyte #: 0.6 x10 3/mm (ref 0.2–0.9)
Monocyte %: 5.6 %
Neutrophil #: 8 10*3/uL — ABNORMAL HIGH (ref 1.4–6.5)
Neutrophil %: 70.1 %
Platelet: 190 10*3/uL (ref 150–440)
RBC: 4.17 10*6/uL (ref 3.80–5.20)
RDW: 14.6 % — ABNORMAL HIGH (ref 11.5–14.5)
WBC: 11.4 10*3/uL — AB (ref 3.6–11.0)

## 2013-10-04 LAB — IRON AND TIBC
Iron Bind.Cap.(Total): 299 ug/dL (ref 250–450)
Iron Saturation: 13 %
Iron: 40 ug/dL — ABNORMAL LOW (ref 50–170)
UNBOUND IRON-BIND. CAP.: 259 ug/dL

## 2013-10-04 LAB — FERRITIN: FERRITIN (ARMC): 56 ng/mL (ref 8–388)

## 2013-10-04 LAB — TSH: THYROID STIMULATING HORM: 1.54 u[IU]/mL

## 2013-10-04 LAB — LIPASE, BLOOD: LIPASE: 124 U/L (ref 73–393)

## 2013-10-04 LAB — AMYLASE: AMYLASE: 53 U/L (ref 25–115)

## 2013-10-04 LAB — BILIRUBIN, DIRECT: Bilirubin, Direct: <0.1 mg/dL (ref 0.00–0.20)

## 2013-10-04 LAB — MAGNESIUM: MAGNESIUM: 1.6 mg/dL — AB

## 2013-10-04 LAB — PROTIME-INR
INR: 0.9
PROTHROMBIN TIME: 12.1 s (ref 11.5–14.7)

## 2013-10-04 LAB — APTT: Activated PTT: 30 secs (ref 23.6–35.9)

## 2013-10-04 LAB — PHOSPHORUS: PHOSPHORUS: 3.4 mg/dL (ref 2.5–4.9)

## 2013-10-04 LAB — HEMOGLOBIN A1C: Hemoglobin A1C: 5.5 % (ref 4.2–6.3)

## 2013-10-04 LAB — FOLATE: FOLIC ACID: 56.7 ng/mL (ref 3.1–100.0)

## 2013-10-14 ENCOUNTER — Ambulatory Visit: Payer: Self-pay | Admitting: Bariatrics

## 2013-10-24 ENCOUNTER — Other Ambulatory Visit: Payer: Self-pay | Admitting: Family Medicine

## 2013-10-24 NOTE — Telephone Encounter (Signed)
Last office visit 09/20/2013 with Barbara Faulkner.  Ok to refill?

## 2013-10-24 NOTE — Telephone Encounter (Signed)
Spoke to pt and informed her Rx has been faxed to requested pharmacy 

## 2013-10-30 ENCOUNTER — Ambulatory Visit: Payer: Self-pay | Admitting: Bariatrics

## 2013-11-28 ENCOUNTER — Other Ambulatory Visit: Payer: Self-pay | Admitting: Family Medicine

## 2013-11-29 NOTE — Telephone Encounter (Signed)
Spoke to pt and informed her Rx has been faxed to requested pharmacy 

## 2013-11-29 NOTE — Telephone Encounter (Signed)
Pt requesting medication refill. Last ov 09/2013 with no future appts scheduled. pls advise 

## 2013-12-09 ENCOUNTER — Other Ambulatory Visit: Payer: Self-pay | Admitting: Internal Medicine

## 2013-12-12 NOTE — Telephone Encounter (Signed)
Last OV was 09/2013 and Rx last prescribed 07/2013 #90 supply--please advise

## 2013-12-30 ENCOUNTER — Ambulatory Visit (INDEPENDENT_AMBULATORY_CARE_PROVIDER_SITE_OTHER): Payer: Federal, State, Local not specified - PPO | Admitting: Internal Medicine

## 2013-12-30 ENCOUNTER — Encounter: Payer: Self-pay | Admitting: Internal Medicine

## 2013-12-30 VITALS — BP 126/82 | HR 73 | Temp 98.1°F | Wt 278.8 lb

## 2013-12-30 DIAGNOSIS — J019 Acute sinusitis, unspecified: Secondary | ICD-10-CM

## 2013-12-30 MED ORDER — AMOXICILLIN 500 MG PO CAPS: 500.0000 mg | ORAL_CAPSULE | Freq: Three times a day (TID) | ORAL | Status: DC

## 2013-12-30 NOTE — Progress Notes (Signed)
Pre visit review using our clinic review tool, if applicable. No additional management support is needed unless otherwise documented below in the visit note. 

## 2013-12-30 NOTE — Progress Notes (Signed)
HPI  Pt presents to the clinic today with 3 day history of headache, facial pain and pressure, tooth pain, cough and sore throat. She is blowing tan/brown mucous out of her nose. She has been running low grade fevers. She has tried OTC mucinex and ibuprofen without much relief. She does have a history allergies. She does not smoke. She also has a history of recurrent sinusitis but no CT sinus has been done.  Review of Systems    Past Medical History  Diagnosis Date  . Allergy   . Depression   . Migraine     Family History  Problem Relation Age of Onset  . Hypertension Mother   . Hyperlipidemia Mother   . HIV Father   . Lung cancer Maternal Grandfather     smoker  . Hepatitis Maternal Uncle     drug use  . Colon cancer Neg Hx   . Colon polyps Neg Hx   . Rectal cancer Neg Hx   . Stomach cancer Neg Hx     History   Social History  . Marital Status: Married    Spouse Name: N/A    Number of Children: 2  . Years of Education: N/A   Occupational History  . [employer], Programmer, [employer]    Social History Main Topics  . Smoking status: Former Smoker -- 2.00 packs/day for 20 years    Types: Cigarettes    Quit date: 09/02/2003  . Smokeless tobacco: Never Used  . Alcohol Use: No  . Drug Use: No  . Sexual Activity: Not on file   Other Topics Concern  . Not on file   Social History Narrative  . No narrative on file    Allergies  Allergen Reactions  . Hydrocod Polst-Cpm Polst Er Other (See Comments)    Bad dreams  . Shellfish Allergy     REACTION: throat closing     Constitutional: Positive headache, fatigue and fever. Denies abrupt weight changes.  HEENT:  Positive eye pain, pressure behind the eyes, facial pain, nasal congestion and sore throat. Denies eye redness, ear pain, ringing in the ears, wax buildup, runny nose or bloody nose. Respiratory: Positive cough. Denies difficulty breathing or shortness of breath.  Cardiovascular: Denies chest pain, chest tightness,  palpitations or swelling in the hands or feet.   No other specific complaints in a complete review of systems (except as listed in HPI above).  Objective:   BP 126/82  Pulse 73  Temp(Src) 98.1 F (36.7 C) (Oral)  Wt 278 lb 12 oz (126.44 kg)  SpO2 98%  General: Appears her stated age, obese but well developed, well nourished in NAD. HEENT: Head: normal shape and size, maxillary and frontal sinus tenderness noted; Eyes: sclera white, no icterus, conjunctiva pink, PERRLA and EOMs intact; Ears: Tm's gray and intact, normal light reflex; Nose: mucosa pink and moist, septum midline; Throat/Mouth: + PND. Teeth present, mucosa pink and moist, no exudate noted, no lesions or ulcerations noted.  Neck: Neck supple, trachea midline. No massses, lumps or thyromegaly present.  Cardiovascular: Normal rate and rhythm. S1,S2 noted.  No murmur, rubs or gallops noted. No JVD or BLE edema. No carotid bruits noted. Pulmonary/Chest: Normal effort and positive vesicular breath sounds. No respiratory distress. No wheezes, rales or ronchi noted.      Assessment & Plan:   Acute bacterial sinusitis  Can use a Neti Pot which can be purchased from your local drug store. Amoxil TID for 10 days Continue ibuprofen for pain/fever  RTC as needed or if symptoms persist.

## 2013-12-30 NOTE — Patient Instructions (Addendum)

## 2014-01-04 ENCOUNTER — Other Ambulatory Visit: Payer: Self-pay | Admitting: Family Medicine

## 2014-01-04 NOTE — Telephone Encounter (Signed)
Rx called in to pharmacy. 

## 2014-01-04 NOTE — Telephone Encounter (Signed)
Ok to refill 

## 2014-01-06 ENCOUNTER — Encounter: Payer: Self-pay | Admitting: Family Medicine

## 2014-01-06 ENCOUNTER — Ambulatory Visit (INDEPENDENT_AMBULATORY_CARE_PROVIDER_SITE_OTHER): Payer: Federal, State, Local not specified - PPO | Admitting: Family Medicine

## 2014-01-06 VITALS — BP 128/72 | HR 92 | Temp 98.5°F | Wt 284.5 lb

## 2014-01-06 DIAGNOSIS — J019 Acute sinusitis, unspecified: Secondary | ICD-10-CM

## 2014-01-06 MED ORDER — AMOXICILLIN-POT CLAVULANATE 875-125 MG PO TABS: 1.0000 | ORAL_TABLET | Freq: Two times a day (BID) | ORAL | Status: DC

## 2014-01-06 NOTE — Patient Instructions (Signed)
Excuse recent time out of work as she is potentially contagious.  Voice rest in the meantime.  Return to work when your voice is normal.   Change to augmentin, rest your voice and gargle with salt water.  Take care.

## 2014-01-06 NOTE — Progress Notes (Signed)
Pre visit review using our clinic review tool, if applicable. No additional management support is needed unless otherwise documented below in the visit note.  12/30/13 note reviewed.  In the interval, started abx. Now with more coughing, sputum, voice is "gone."  Voice out for 2 days.  No FCNAVD.  No rash.  R ear pain this AM. No rhinorrhea now. ST continues, worse than prev.  Inc in wheeze, more than prev.  On MTX for psoriatic arthritis.   Meds, vitals, and allergies reviewed.   ROS: See HPI.  Otherwise, noncontributory.  GEN: nad, alert and oriented, hoarse voice, no stridor HEENT: mucous membranes moist, tm w/o erythema, nasal exam w/o erythema, clear discharge noted,  OP with cobblestoning, sinuses ttp x4 NECK: supple w/o LA CV: rrr.   PULM: ctab, no inc wob EXT: no edema SKIN: no acute rash

## 2014-01-08 DIAGNOSIS — J019 Acute sinusitis, unspecified: Secondary | ICD-10-CM | POA: Insufficient documentation

## 2014-01-08 DIAGNOSIS — J329 Chronic sinusitis, unspecified: Secondary | ICD-10-CM | POA: Insufficient documentation

## 2014-01-08 NOTE — Assessment & Plan Note (Signed)
Nontoxic, change to augmentin, d/w pt.  F/u prn. Supportive care o/w.  See instructions.  Voice rest.

## 2014-02-07 ENCOUNTER — Ambulatory Visit: Payer: Self-pay | Admitting: Bariatrics

## 2014-02-07 LAB — CBC WITH DIFFERENTIAL/PLATELET
Basophil #: 0 10*3/uL (ref 0.0–0.1)
Basophil %: 0.3 %
EOS ABS: 0.1 10*3/uL (ref 0.0–0.7)
EOS PCT: 1.3 %
HCT: 37.7 % (ref 35.0–47.0)
HGB: 11.6 g/dL — ABNORMAL LOW (ref 12.0–16.0)
LYMPHS ABS: 2.4 10*3/uL (ref 1.0–3.6)
Lymphocyte %: 21.3 %
MCH: 26.5 pg (ref 26.0–34.0)
MCHC: 30.7 g/dL — AB (ref 32.0–36.0)
MCV: 86 fL (ref 80–100)
MONOS PCT: 5.7 %
Monocyte #: 0.6 x10 3/mm (ref 0.2–0.9)
NEUTROS PCT: 71.4 %
Neutrophil #: 8.2 10*3/uL — ABNORMAL HIGH (ref 1.4–6.5)
Platelet: 191 10*3/uL (ref 150–440)
RBC: 4.36 10*6/uL (ref 3.80–5.20)
RDW: 14 % (ref 11.5–14.5)
WBC: 11.4 10*3/uL — AB (ref 3.6–11.0)

## 2014-02-07 LAB — BASIC METABOLIC PANEL
ANION GAP: 5 — AB (ref 7–16)
BUN: 27 mg/dL — ABNORMAL HIGH (ref 7–18)
CALCIUM: 9.1 mg/dL (ref 8.5–10.1)
CHLORIDE: 111 mmol/L — AB (ref 98–107)
Co2: 24 mmol/L (ref 21–32)
Creatinine: 0.98 mg/dL (ref 0.60–1.30)
EGFR (Non-African Amer.): >60
Glucose: 87 mg/dL (ref 65–99)
Osmolality: 284 (ref 275–301)
POTASSIUM: 4.3 mmol/L (ref 3.5–5.1)
Sodium: 140 mmol/L (ref 136–145)

## 2014-02-10 ENCOUNTER — Other Ambulatory Visit: Payer: Self-pay | Admitting: Family Medicine

## 2014-02-10 NOTE — Telephone Encounter (Signed)
Ok to refill per Dr Dayton MartesAron. Spoke to pt and informed her Rx has been called in to requested pharmacy

## 2014-02-14 ENCOUNTER — Inpatient Hospital Stay: Payer: Self-pay | Admitting: Bariatrics

## 2014-02-15 LAB — CBC WITH DIFFERENTIAL/PLATELET
Basophil #: 0 10*3/uL (ref 0.0–0.1)
Basophil %: 0.1 %
EOS ABS: 0.1 10*3/uL (ref 0.0–0.7)
Eosinophil %: 0.4 %
HCT: 35.1 % (ref 35.0–47.0)
HGB: 11.5 g/dL — ABNORMAL LOW (ref 12.0–16.0)
LYMPHS PCT: 8.2 %
Lymphocyte #: 1.5 10*3/uL (ref 1.0–3.6)
MCH: 27.8 pg (ref 26.0–34.0)
MCHC: 32.6 g/dL (ref 32.0–36.0)
MCV: 85 fL (ref 80–100)
Monocyte #: 1.2 x10 3/mm — ABNORMAL HIGH (ref 0.2–0.9)
Monocyte %: 6.3 %
NEUTROS PCT: 85 %
Neutrophil #: 15.7 10*3/uL — ABNORMAL HIGH (ref 1.4–6.5)
Platelet: 218 10*3/uL (ref 150–440)
RBC: 4.12 10*6/uL (ref 3.80–5.20)
RDW: 14.1 % (ref 11.5–14.5)
WBC: 18.5 10*3/uL — AB (ref 3.6–11.0)

## 2014-02-15 LAB — BASIC METABOLIC PANEL
Anion Gap: 9 (ref 7–16)
BUN: 11 mg/dL (ref 7–18)
Calcium, Total: 8.2 mg/dL — ABNORMAL LOW (ref 8.5–10.1)
Chloride: 103 mmol/L (ref 98–107)
Co2: 26 mmol/L (ref 21–32)
Creatinine: 0.71 mg/dL (ref 0.60–1.30)
GLUCOSE: 99 mg/dL (ref 65–99)
Osmolality: 275 (ref 275–301)
Potassium: 3.8 mmol/L (ref 3.5–5.1)
Sodium: 138 mmol/L (ref 136–145)

## 2014-02-15 LAB — PATHOLOGY REPORT

## 2014-02-16 LAB — CBC WITH DIFFERENTIAL/PLATELET
Basophil #: 0.1 10*3/uL (ref 0.0–0.1)
Basophil %: 0.7 %
Eosinophil #: 0 10*3/uL (ref 0.0–0.7)
Eosinophil %: 0 %
HCT: 36.3 % (ref 35.0–47.0)
HGB: 11.4 g/dL — ABNORMAL LOW (ref 12.0–16.0)
Lymphocyte #: 1.2 10*3/uL (ref 1.0–3.6)
Lymphocyte %: 6.2 %
MCH: 26.9 pg (ref 26.0–34.0)
MCHC: 31.5 g/dL — AB (ref 32.0–36.0)
MCV: 85 fL (ref 80–100)
Monocyte #: 0.3 x10 3/mm (ref 0.2–0.9)
Monocyte %: 1.7 %
Neutrophil #: 17.7 10*3/uL — ABNORMAL HIGH (ref 1.4–6.5)
Neutrophil %: 91.4 %
PLATELETS: 223 10*3/uL (ref 150–440)
RBC: 4.26 10*6/uL (ref 3.80–5.20)
RDW: 14.3 % (ref 11.5–14.5)
WBC: 19.4 10*3/uL — ABNORMAL HIGH (ref 3.6–11.0)

## 2014-03-08 ENCOUNTER — Ambulatory Visit: Payer: Self-pay | Admitting: Bariatrics

## 2014-04-29 ENCOUNTER — Other Ambulatory Visit: Payer: Self-pay | Admitting: Family Medicine

## 2014-05-01 NOTE — Telephone Encounter (Signed)
Rx called in to requested pharmacy. Ok to fill per Dr Dayton Martes, Last Rx 01/2014

## 2014-07-06 ENCOUNTER — Other Ambulatory Visit: Payer: Self-pay | Admitting: Family Medicine

## 2014-07-07 NOTE — Telephone Encounter (Signed)
Rx called in as directed.   

## 2014-07-19 ENCOUNTER — Encounter: Payer: Self-pay | Admitting: Family Medicine

## 2014-07-19 ENCOUNTER — Ambulatory Visit (INDEPENDENT_AMBULATORY_CARE_PROVIDER_SITE_OTHER): Payer: Federal, State, Local not specified - PPO | Admitting: Family Medicine

## 2014-07-19 VITALS — BP 122/80 | HR 68 | Temp 99.0°F | Wt 211.2 lb

## 2014-07-19 DIAGNOSIS — J0100 Acute maxillary sinusitis, unspecified: Secondary | ICD-10-CM

## 2014-07-19 MED ORDER — AZITHROMYCIN 250 MG PO TABS: ORAL_TABLET | ORAL | Status: DC

## 2014-07-19 NOTE — Progress Notes (Signed)
Pre visit review using our clinic review tool, if applicable. No additional management support is needed unless otherwise documented below in the visit note.  She has less joint pain as her weight decreased.  She is off MTX for psoriatic arthritis, [facility].   Had bariatric surgery summer 2015.   Sx stated about 5 days ago.  First noted HA, then nasal congestion. Voice is altered.  Cough started today.  Much more sputum today. Not improved overall. Recently with fevers, usually at night.  Occ wheeze.  No ear pain but some ringing recently noted. Still with facial pain and tooth pain.  No vomiting, no diarrhea.    Meds, vitals, and allergies reviewed.   ROS: See HPI.  Otherwise, noncontributory.  GEN: nad, alert and oriented HEENT: mucous membranes moist, tm w/o erythema, nasal exam w/o erythema, clear discharge noted,  OP with cobblestoning, sinuses ttp X4 NECK: supple w/o LA CV: rrr.   PULM: ctab, no inc wob EXT: no edema SKIN: no acute rash

## 2014-07-19 NOTE — Patient Instructions (Signed)
Drink sips of fluids frequently, start the antibiotics and gargle with warm salt water for your throat.   This should gradually improve.  Take care.  Let us know if you have other concerns.

## 2014-07-20 DIAGNOSIS — J0100 Acute maxillary sinusitis, unspecified: Secondary | ICD-10-CM | POA: Insufficient documentation

## 2014-07-20 NOTE — Assessment & Plan Note (Addendum)
Nontoxic, start zmax, supportive tx o/w.  She agrees. Fu prn.  Given her bariatric surgery hx, she may need 2 courses of abx.

## 2014-08-25 ENCOUNTER — Other Ambulatory Visit: Payer: Self-pay | Admitting: Family Medicine

## 2014-08-28 NOTE — Telephone Encounter (Signed)
Ok to phone in tramadol 

## 2014-08-29 NOTE — Telephone Encounter (Signed)
Rx called in to requested pharmacy 

## 2014-09-20 ENCOUNTER — Other Ambulatory Visit: Payer: Self-pay | Admitting: Internal Medicine

## 2014-09-20 DIAGNOSIS — Z0000 Encounter for general adult medical examination without abnormal findings: Secondary | ICD-10-CM

## 2014-09-21 ENCOUNTER — Ambulatory Visit: Payer: Self-pay | Admitting: Family Medicine

## 2014-09-22 ENCOUNTER — Other Ambulatory Visit: Payer: Federal, State, Local not specified - PPO

## 2014-09-25 ENCOUNTER — Other Ambulatory Visit (HOSPITAL_COMMUNITY)
Admission: RE | Admit: 2014-09-25 | Discharge: 2014-09-25 | Disposition: A | Discharge status: Home / Self Care | Payer: Federal, State, Local not specified - PPO | Source: Ambulatory Visit | Attending: Family Medicine | Admitting: Family Medicine

## 2014-09-25 ENCOUNTER — Other Ambulatory Visit: Payer: Federal, State, Local not specified - PPO

## 2014-09-25 ENCOUNTER — Encounter: Payer: Self-pay | Admitting: Family Medicine

## 2014-09-25 ENCOUNTER — Ambulatory Visit (INDEPENDENT_AMBULATORY_CARE_PROVIDER_SITE_OTHER): Payer: Federal, State, Local not specified - PPO | Admitting: Family Medicine

## 2014-09-25 VITALS — BP 134/88 | HR 82 | Temp 97.7°F | Ht 64.0 in | Wt 193.2 lb

## 2014-09-25 DIAGNOSIS — L4050 Arthropathic psoriasis, unspecified: Secondary | ICD-10-CM

## 2014-09-25 DIAGNOSIS — Z113 Encounter for screening for infections with a predominantly sexual mode of transmission: Secondary | ICD-10-CM | POA: Insufficient documentation

## 2014-09-25 DIAGNOSIS — F4323 Adjustment disorder with mixed anxiety and depressed mood: Secondary | ICD-10-CM

## 2014-09-25 DIAGNOSIS — Z0000 Encounter for general adult medical examination without abnormal findings: Secondary | ICD-10-CM

## 2014-09-25 DIAGNOSIS — Z9884 Bariatric surgery status: Secondary | ICD-10-CM

## 2014-09-25 DIAGNOSIS — Z01419 Encounter for gynecological examination (general) (routine) without abnormal findings: Secondary | ICD-10-CM | POA: Diagnosis not present

## 2014-09-25 DIAGNOSIS — N760 Acute vaginitis: Secondary | ICD-10-CM | POA: Insufficient documentation

## 2014-09-25 MED ORDER — PAROXETINE HCL 20 MG PO TABS: 20.0000 mg | ORAL_TABLET | Freq: Every morning | ORAL | Status: DC

## 2014-09-25 NOTE — Progress Notes (Signed)
Pre visit review using our clinic review tool, if applicable. No additional management support is needed unless otherwise documented below in the visit note. 

## 2014-09-25 NOTE — Assessment & Plan Note (Signed)
Deteriorated. Ok to restart Paxil and I did advise that she talk with a senior person at work since when she is not at work, she is very happy and not tearful. eRx sent for paxil. Call or return to clinic prn if these symptoms worsen or fail to improve as anticipated. The patient indicates understanding of these issues and agrees with the plan.

## 2014-09-25 NOTE — Assessment & Plan Note (Signed)
Reviewed preventive care protocols, scheduled due services, and updated immunizations Discussed nutrition, exercise, diet, and healthy lifestyle.  Discussed USPSTF recommendations of cervical cancer screening.  She is aware that interval of 3 years is recommended but pt would prefer to have pap smear done today.  Orders Placed This Encounter  Procedures  . CBC with Differential/Platelet  . Lipid panel  . Comprehensive metabolic panel  . TSH  . Ferritin  . Amylase  . Folate  . Vitamin B12  . Vitamin D, 25-hydroxy  . Vitamin B1  . Phosphorus

## 2014-09-25 NOTE — Assessment & Plan Note (Signed)
Congratulated her on her tremendous weight loss and continued effort. Will check labs today - brings in lab request from her bariatric surgeon.

## 2014-09-25 NOTE — Addendum Note (Signed)
Addended by: Sydell AxonLAWS, Le Ferraz C on: 09/25/2014 03:57 PM   Modules accepted: Orders

## 2014-09-25 NOTE — Patient Instructions (Signed)
It was great to see you. We will call you with your results.  You look fantastic!
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
## 2014-09-26 LAB — LIPID PANEL
CHOL/HDL RATIO: 4
CHOLESTEROL: 119 mg/dL (ref 0–200)
HDL: 30.7 mg/dL — ABNORMAL LOW (ref >39.00)
LDL Cholesterol: 71 mg/dL (ref 0–99)
NonHDL: 88.3
TRIGLYCERIDES: 89 mg/dL (ref 0.0–149.0)
VLDL: 17.8 mg/dL (ref 0.0–40.0)

## 2014-09-26 LAB — CBC WITH DIFFERENTIAL/PLATELET
BASOS PCT: 0.4 % (ref 0.0–3.0)
Basophils Absolute: 0.1 10*3/uL (ref 0.0–0.1)
Eosinophils Absolute: 0.1 10*3/uL (ref 0.0–0.7)
Eosinophils Relative: 0.5 % (ref 0.0–5.0)
HEMATOCRIT: 32.7 % — AB (ref 36.0–46.0)
HEMOGLOBIN: 10.5 g/dL — AB (ref 12.0–15.0)
Lymphocytes Relative: 16.4 % (ref 12.0–46.0)
Lymphs Abs: 2.3 10*3/uL (ref 0.7–4.0)
MCHC: 32 g/dL (ref 30.0–36.0)
MCV: 81.2 fl (ref 78.0–100.0)
Monocytes Absolute: 0.5 10*3/uL (ref 0.1–1.0)
Monocytes Relative: 3.7 % (ref 3.0–12.0)
NEUTROS PCT: 79 % — AB (ref 43.0–77.0)
Neutro Abs: 11.3 10*3/uL — ABNORMAL HIGH (ref 1.4–7.7)
Platelets: 210 10*3/uL (ref 150.0–400.0)
RBC: 4.03 Mil/uL (ref 3.87–5.11)
RDW: 14.1 % (ref 11.5–15.5)
WBC: 14.3 10*3/uL — AB (ref 4.0–10.5)

## 2014-09-26 LAB — COMPREHENSIVE METABOLIC PANEL
ALK PHOS: 107 U/L (ref 39–117)
ALT: 18 U/L (ref 0–35)
AST: 22 U/L (ref 0–37)
Albumin: 4 g/dL (ref 3.5–5.2)
BILIRUBIN TOTAL: 0.3 mg/dL (ref 0.2–1.2)
BUN: 17 mg/dL (ref 6–23)
CO2: 27 meq/L (ref 19–32)
Calcium: 9.4 mg/dL (ref 8.4–10.5)
Chloride: 107 mEq/L (ref 96–112)
Creatinine, Ser: 1.17 mg/dL (ref 0.40–1.20)
GFR: 63.96 mL/min (ref >60.00)
Glucose, Bld: 93 mg/dL (ref 70–99)
POTASSIUM: 3.4 meq/L — AB (ref 3.5–5.1)
SODIUM: 143 meq/L (ref 135–145)
Total Protein: 6.4 g/dL (ref 6.0–8.3)

## 2014-09-26 LAB — AMYLASE: Amylase: 33 U/L (ref 27–131)

## 2014-09-26 LAB — TSH: TSH: 0.57 u[IU]/mL (ref 0.35–4.50)

## 2014-09-26 LAB — FERRITIN: Ferritin: 114.2 ng/mL (ref 10.0–291.0)

## 2014-09-26 LAB — VITAMIN D 25 HYDROXY (VIT D DEFICIENCY, FRACTURES): VITD: 20.84 ng/mL — ABNORMAL LOW (ref 30.00–100.00)

## 2014-09-26 LAB — FOLATE: Folate: 7.3 ng/mL (ref >5.9)

## 2014-09-26 LAB — VITAMIN B12: VITAMIN B 12: 211 pg/mL (ref 211–911)

## 2014-09-26 LAB — PHOSPHORUS: PHOSPHORUS: 5 mg/dL — AB (ref 2.3–4.6)

## 2014-09-28 LAB — CYTOLOGY - PAP

## 2014-09-29 ENCOUNTER — Other Ambulatory Visit: Payer: Federal, State, Local not specified - PPO

## 2014-09-29 LAB — VITAMIN B1: Vitamin B1 (Thiamine): 7 nmol/L — ABNORMAL LOW (ref 8–30)

## 2014-12-23 NOTE — Op Note (Signed)
PATIENT NAME:  Barbara Faulkner, Barbara Faulkner MR#:  [id number] DATE OF BIRTH:  11/12/1965  DATE OF PROCEDURE:  02/14/2014  PROCEDURE PERFORMED: Laparoscopic duodenal switch with ileoduodenal anastomosis for the lateral gastrectomy.  PHYSICIANS IN ATTENDANCE: Casimiro Needle A. Alva Garnet, MD  ASSISTANT: Malva Limes. [place]ink[place], PA-C  PREOPERATIVE DIAGNOSES: Long-standing morbid obesity with multiple attempts at dieting, including prior Lap-Band placement, followed by band removal secondary to complications.   POSTOPERATIVE DIAGNOSES: Long-standing morbid obesity with multiple attempts at dieting, including prior Lap-Band placement, followed by band removal secondary to complications, with significant central abdominal obesity associated with markedly thickened anterior abdominal wall, also adhesions in the left upper quadrant associated with prior Lap-Band procedure.   DESCRIPTION OF PROCEDURE: The patient was brought to the operating room and placed in supine position. General anesthesia obtained with orotracheal intubation. The abdomen was sterilely prepped and draped after TED hose and Thromboguards were applied and a foot board applied at the end of the operative bed. The patient's chest and abdomen were sterilely prepped and draped, as mentioned above. This was followed by introduction of a 5 mm trocar in the left upper quadrant of the abdomen. Pneumoperitoneum was obtained with carbon dioxide. Four additional trocars were introduced across the upper abdomen. The patient's abdominal wall did result in significant difficulty in mobility with each trocar. This resulted in the need of placement of an additional 5 mm trocar in the right lower quadrant to be able to manipulate the distal ileum. The distal ileum was identified and then followed proximally 300 cm, at which point the small bowel was secured to the gastrocolic ligament. Again, because of the marked thickening of the abdominal wall, the initial trocar used for the camera system  was withdrawn, and an additional placed slight higher on the anterior abdominal wall. The patient had division of vascular pedicles along the lower greater curvature of the stomach beginning approximately 6 to 7 cm above the pylorus. This was then extended inferiorly, with mobilization of the first 2.5 cm of the duodenum. This was freed from the underlying pancreas and pancreaticoduodenal artery. After creating a window on the lateral curvature of the duodenum where the peritoneum was thickened following prior cholecystectomy, a blue load GIA stapler was used to divide the duodenum. The vascular branches and peritoneal attachments along the proximal duodenum were further mobilized at this point with use of the Harmonic scalpel. This resulted in an ability to swing the duodenum and distal stomach towards the midline. At this point, a double-layered running duodenoileal anastomosis constructed. This was accomplished with a running 2-0 Polysorb suture approximating the seromuscular aspects of the duodenum to the antimesenteric border of the ileum. Next, enterotomies made on the anterior aspect of the duodenum and the antimesenteric margin of the ileum. The patient then had a full-thickness closure with a running 3-0 Polysorb suture. The enterotomy on each bowel was noted to measure approximately 1.5 cm in length. The patient then had running enforcing 2-0 Polysorb suture. At this point, the stomach was addressed. A black load GIA stapler was placed in a relative transverse direction in an effort to narrow the distal aspect of the antrum and at the same time avoid approaching the incisura. A second gold load staple also placed parallel to the lesser curvature, but in a relative transverse direction. The patient, at this time, had a 62 [place] ViSiGi device directed into the distal stomach, and a more vertical line of staples was placed using gold load GIA staples. These were placed parallel to the  lesser curvature. As  this was advanced, posterior attachments of the stomach and the pancreas were mobilized. In addition to this, the ViSiGi device was gradually withdrawn to try to create a consistent tubular effect along the staple line. Superiorly, the patient noted to have adhesions between the anterior stomach and the left lobe of the liver. These were taken down by use of the Harmonic scalpel. In addition to this, the residual vascular pedicles of the greater curvature of the stomach were divided in an effort to maximize mobility of the upper fundus. The patient then had completion of a vertical line of staples brought out just lateral to the angle of His, leaving a small dog ear of stomach present. The ViSiGi device was then redirected into the antrum, and the jejunum was occluded both proximal and distal to the anastomosis, and then saline bath performed. With insufflation, there was no evidence of an air leak noted. At this time, the patient had a pexy of the prior divided gastrocolic ligament to portions of the lateral margin of the sleeve stomach to prevent any rotation or spinning of the gastric tube effect. The lateral stomach was retrieved through the right upper quadrant 12 mm trocar site. At this time, no additional intervention was felt to be necessary. The pneumoperitoneum was relieved, the trocars removed. The patient was allowed to recover after the wound was closed with staples and covered with sterile dressings. There was minimal blood loss.   ____________________________ Michael A. Alva Garnetyner, MD mat:lb D: 02/14/2014 11Tyrone Apple:57:49 ET T: 02/14/2014 12:54:12 ET JOB#: [id number]  cc: Casimiro NeedleMichael A. Alva Garnetyner, MD, <Dictator> Everette RankMICHAEL A TYNER MD ELECTRONICALLY SIGNED 02/15/2014 7:27

## 2015-01-23 ENCOUNTER — Other Ambulatory Visit: Payer: Self-pay | Admitting: Family Medicine

## 2015-01-23 NOTE — Telephone Encounter (Signed)
Rx called in to requested pharmacy 

## 2015-01-23 NOTE — Telephone Encounter (Signed)
Last f/u appt 09/2014. Last Rx 08/2014

## 2015-04-18 ENCOUNTER — Ambulatory Visit (INDEPENDENT_AMBULATORY_CARE_PROVIDER_SITE_OTHER): Payer: Federal, State, Local not specified - PPO | Admitting: Family Medicine

## 2015-04-18 ENCOUNTER — Ambulatory Visit (INDEPENDENT_AMBULATORY_CARE_PROVIDER_SITE_OTHER)
Admission: RE | Admit: 2015-04-18 | Discharge: 2015-04-18 | Disposition: A | Discharge status: Home / Self Care | Payer: Federal, State, Local not specified - PPO | Source: Ambulatory Visit | Attending: Family Medicine | Admitting: Family Medicine

## 2015-04-18 ENCOUNTER — Encounter: Payer: Self-pay | Admitting: Family Medicine

## 2015-04-18 VITALS — BP 120/80 | HR 81 | Temp 97.5°F | Wt 164.0 lb

## 2015-04-18 DIAGNOSIS — M25551 Pain in right hip: Secondary | ICD-10-CM | POA: Diagnosis not present

## 2015-04-18 NOTE — Progress Notes (Signed)
Pre visit review using our clinic review tool, if applicable. No additional management support is needed unless otherwise documented below in the visit note. 

## 2015-04-18 NOTE — Progress Notes (Signed)
Subjective:   Patient ID: Barbara Faulkner, female    DOB: 06-Sep-1965, 49 y.o.   MRN: [id number]  Barbara Faulkner is a pleasant 49 y.o. year old female who presents to clinic today with right hip pain  on 04/18/2015  HPI:  Right/hip and pelvic pain for over a year.  Started shortly after her bariatric surgery.  She has lost a total of 146 pounds and feels great.  Does [employer] regularly, runs several miles a day.  Right hip does not hurt all the time but with outward kicks or squats it is so painful it can "take her breath away."  No known injury.  Does not seem to be progressing but not getting better.  Has known OA in hands, knees and feet.  Knees have improved with the weight loss.  NSAIDs do help but she tries not to take them- pain stops if she moves into a different position.  Current Outpatient Prescriptions on File Prior to Visit  Medication Sig Dispense Refill  . Calcium-Magnesium-Zinc 167-83-8 MG TABS Take 1 tablet by mouth.    . Cholecalciferol (VITAMIN D) 2000 UNITS CAPS Take 3 capsules by mouth daily.    [name] EPINEPHrine (EPIPEN 2-PAK) 0.3 mg/0.3 mL DEVI Inject as directed and then go to ER     . Iron-Folic Acid-Vit B12 (IRON FORMULA PO) Take 1 tablet by mouth daily.    . Multiple Vitamin (MULTIVITAMIN) tablet Take 1 tablet by mouth daily.    [name] PARoxetine (PAXIL) 20 MG tablet Take 1 tablet (20 mg total) by mouth every morning. 90 tablet 3  . traMADol (ULTRAM) 50 MG tablet TAKE 1 TABLET BY MOUTH EVERY 6 HOURS AS NEEDED 60 tablet 0   No current facility-administered medications on file prior to visit.    Allergies  Allergen Reactions  . Shellfish Allergy Swelling    REACTION: throat closing  . Hydrocod Polst-Cpm Polst Er Other (See Comments)    Bad dreams    Past Medical History  Diagnosis Date  . Allergy   . Depression   . Migraine     Past Surgical History  Procedure Laterality Date  . Cholecystectomy  2009  . Tubal ligation  1997  . Laparoscopic gastric  banding  2008     removed 2009  . Gastric bypass      2015    Family History  Problem Relation Age of Onset  . Hypertension Mother   . Hyperlipidemia Mother   . HIV Father   . Lung cancer Maternal Grandfather     smoker  . Hepatitis Maternal Uncle     drug use  . Colon cancer Neg Hx   . Colon polyps Neg Hx   . Rectal cancer Neg Hx   . Stomach cancer Neg Hx     Social History   Social History  . Marital Status: Married    Spouse Name: N/A  . Number of Children: 2  . Years of Education: N/A   Occupational History  . [employer], Programmer, applications    Social History Main Topics  . Smoking status: Former Smoker -- 2.00 packs/day for 20 years    Types: Cigarettes    Quit date: 09/02/2003  . Smokeless tobacco: Never Used  . Alcohol Use: No  . Drug Use: No  . Sexual Activity: Not on file   Other Topics Concern  . Not on file   Social History Narrative   The PMH, PSH, Social History, Family History, Medications, and allergies have  been reviewed in [facility], and have been updated if relevant.   Review of Systems  Constitutional: Negative.   Respiratory: Negative.   Cardiovascular: Negative.   Musculoskeletal: Negative.  Negative for joint swelling.  Skin: Negative.   Psychiatric/Behavioral: Negative.   All other systems reviewed and are negative.      Objective:    BP 120/80 mmHg  Pulse 81  Temp(Src) 97.5 F (36.4 C) (Tympanic)  Wt 164 lb (74.39 kg)  SpO2 98% Wt Readings from Last 3 Encounters:  04/18/15 164 lb (74.39 kg)  09/25/14 193 lb 4 oz (87.658 kg)  07/19/14 211 lb 4 oz (95.822 kg)     Physical Exam  Constitutional: She is oriented to person, place, and time. She appears well-developed and well-nourished. No distress.  Eyes: Conjunctivae are normal.  Cardiovascular: Normal rate.   Pulmonary/Chest: Effort normal.  Musculoskeletal: She exhibits no edema.  NTTP over right trochanteric bursa Decreased ROM due to pain with external and internal (more with  internal) rotation of the hip  Neurological: She is alert and oriented to person, place, and time. No cranial nerve deficit.  Skin: Skin is warm and dry.  Psychiatric: She has a normal mood and affect. Her behavior is normal. Judgment and thought content normal.  Nursing note and vitals reviewed.         Assessment & Plan:   Right hip pain - Plan: DG HIP UNILAT WITH PELVIS 2-3 VIEWS RIGHT No Follow-up on file.

## 2015-04-18 NOTE — Patient Instructions (Signed)
Great to see you.  I will call you with your xray results. 

## 2015-04-18 NOTE — Assessment & Plan Note (Signed)
New- given duration of symptoms, xray of hip and pelvis today. ?OA vs FAI or even a stress fracture. Orders Placed This Encounter  Procedures  . DG HIP UNILAT WITH PELVIS 2-3 VIEWS RIGHT   The patient indicates understanding of these issues and agrees with the plan.

## 2015-05-02 ENCOUNTER — Ambulatory Visit (INDEPENDENT_AMBULATORY_CARE_PROVIDER_SITE_OTHER): Payer: Federal, State, Local not specified - PPO | Admitting: Family Medicine

## 2015-05-02 ENCOUNTER — Encounter: Payer: Self-pay | Admitting: Family Medicine

## 2015-05-02 VITALS — BP 110/76 | HR 76 | Temp 97.8°F | Ht 64.0 in | Wt 162.2 lb

## 2015-05-02 DIAGNOSIS — M25551 Pain in right hip: Secondary | ICD-10-CM | POA: Diagnosis not present

## 2015-05-02 NOTE — Patient Instructions (Signed)

## 2015-05-02 NOTE — Progress Notes (Signed)
Dr. Karleen Hampshire T. Vahan Wadsworth, MD, CAQ Sports Medicine [facility] [address] [place], [address] Phone: [phone] Fax: [phone]  05/02/2015  Patient: Barbara Faulkner, MRN: [id number], DOB: May 04, 1966, 49 y.o.  Primary Physician:  Ruthe Mannan, MD  Chief Complaint: Hip Pain  Subjective:   Barbara Faulkner is a 49 y.o. very pleasant female patient who presents with the following:  Last July, got off the bed wrong and started to feel a pop and ain in the groin. Feels it in the pback.   Wt Readings from Last 3 Encounters:  05/02/15 162 lb 4 oz (73.596 kg)  04/18/15 164 lb (74.39 kg)  09/25/14 193 lb 4 oz (87.658 kg)    Very physically active female who is status post bariatric surgery in July 2015. In July 2015 the patient got off of her bed wrong and felt a pop and an unusual sensation in her groin, and ever since then she has had pain with terminal motion and movement.  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 At this point she is effectively not able to do any kind of kicking on the right with her right leg during martial arts. Pain with rotational movements.   Past Medical History, Surgical History, Social History, Family History, Problem List, Medications, and Allergies have been reviewed and updated if relevant.  Patient Active Problem List   Diagnosis Date Noted  . Right hip pain 04/18/2015  . S/P bariatric surgery 09/25/2014  . Routine general medical examination at a health care facility 09/13/2013  . Psoriatic arthritis 09/13/2013  . DUB (dysfunctional uterine bleeding) 10/27/2012  . 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 Lyme disease 06/22/2012  . 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 SHELLFISH ALLERGY 09/27/2010  . THYROMEGALY 09/03/2010  . ALLERGIC RHINITIS 03/15/2010  . MORBID OBESITY 05/17/2008  . Adjustment disorder with mixed anxiety and depressed mood 05/17/2008  . MIGRAINE W/AURA W/O INTRACT W/O  STATUS MIGRNOSUS 05/17/2008    Past Medical History  Diagnosis Date  . Allergy   . Depression   . Migraine     Past Surgical History  Procedure Laterality Date  . Cholecystectomy  2009  . Tubal ligation  1997  . Laparoscopic gastric banding  2008     removed 2009  . Gastric bypass      2015    Social History   Social History  . Marital Status: Married    Spouse Name: N/A  . Number of Children: 2  . Years of Education: N/A   Occupational History  . [employer], Programmer, applications    Social History Main Topics  . Smoking status: Former Smoker -- 2.00 packs/day for 20 years    Types: Cigarettes    Quit date: 09/02/2003  . Smokeless tobacco: Never Used  . Alcohol Use: No  . Drug Use: No  . Sexual Activity: Not on file   Other Topics Concern  . Not on file   Social History Narrative    Family History  Problem Relation Age of Onset  . Hypertension Mother   . Hyperlipidemia Mother   . HIV Father   . Lung cancer Maternal Grandfather     smoker  . Hepatitis Maternal Uncle     drug use  . Colon cancer Neg Hx   . Colon polyps Neg Hx   . Rectal cancer Neg Hx   . Stomach cancer Neg Hx     Allergies  Allergen  Reactions  . Shellfish Allergy Swelling    REACTION: throat closing  . Hydrocod Polst-Cpm Polst Er Other (See Comments)    Bad dreams    Medication list reviewed and updated in full in [facility].  GEN: No fevers, chills. Nontoxic. Primarily MSK c/o today. MSK: Detailed in the HPI GI: tolerating PO intake without difficulty Neuro: No numbness, parasthesias, or tingling associated. Otherwise the pertinent positives of the ROS are noted above.   Objective:   BP 110/76 mmHg  Pulse 76  Temp(Src) 97.8 F (36.6 C) (Oral)  Ht 5\' 4"  (1.626 m)  Wt 162 lb 4 oz (73.596 kg)  BMI 27.84 kg/m2  LMP 04/02/2014   GEN: WDWN, NAD, Non-toxic, Alert & Oriented x 3 HEENT: Atraumatic, Normocephalic.  Ears and Nose: No external deformity. EXTR: No  clubbing/cyanosis/edema NEURO: Normal gait.  PSYCH: Normally interactive. Conversant. Not depressed or anxious appearing.  Calm demeanor.   HIP EXAM: SIDE: R ROM: Abduction, Flexion, Internal and External range of motion: notable pain and restriction at [facility] AND IROM Abduction preserved Pain with terminal IROM and EROM: yes MARKEDLY TENDER WITH HIP FLEXION, ADDUCTION AND IROM GTB: minimally tender SLR: NEG Knees: No effusion FABER: NT REVERSE FABER: NT, neg Piriformis: NT at direct palpation Str: flexion: 5/5 abduction: 5/5 adduction: 4+/5 Strength testing non-tender   Radiology: Dg Hip Unilat With Pelvis 2-3 Views Right  04/18/2015   CLINICAL DATA:  Pelvic and right hip pain for 1-1/2 years, no trauma, pain increases with activity  EXAM: DG HIP (WITH OR WITHOUT PELVIS) 2-3V RIGHT  COMPARISON:  None.  FINDINGS: The hip joint spaces appear normal. No significant degenerative change is seen. The pelvic rami are intact. The SI joints are corticated. Faint calcification in the mid lower pelvis probably represents material within bowel.  IMPRESSION: Negative.   Electronically Signed   By: Dwyane Dee M.D.   On: 04/18/2015 09:02    Assessment and Plan:   Right hip pain - Plan: DG FLUORO GUIDE NDL PLCD/BX/INJ/LOC, MR Hip Right W Contrast  With 1 year history of failure to improve with conservative measures and + FADIR test, obtain an MR Arthrogram of the right hip to evaluate for labral tear or potentially stress fracture in a patient post bariatric surgery  Follow-up: depending on MR Arthrogram  New Prescriptions   No medications on file   Orders Placed This Encounter  Procedures  . DG FLUORO GUIDE NDL PLCD/BX/INJ/LOC  . MR Hip Right W Contrast    Signed,  Hiilei Gerst T. Resha Filippone, MD   Patient's Medications  New Prescriptions   No medications on file  Previous Medications   CALCIUM-MAGNESIUM-ZINC 167-83-8 MG TABS    Take 1 tablet by mouth.   CHOLECALCIFEROL (VITAMIN D) 2000  UNITS CAPS    Take 3 capsules by mouth daily.   EPINEPHRINE (EPIPEN 2-PAK) 0.3 MG/0.3 ML DEVI    Inject as directed and then go to ER    IRON-FOLIC ACID-VIT B12 (IRON FORMULA PO)    Take 1 tablet by mouth daily.   MULTIPLE VITAMIN (MULTIVITAMIN) TABLET    Take 1 tablet by mouth daily.   PAROXETINE (PAXIL) 20 MG TABLET    Take 1 tablet (20 mg total) by mouth every morning.   TRAMADOL (ULTRAM) 50 MG TABLET    TAKE 1 TABLET BY MOUTH EVERY 6 HOURS AS NEEDED  Modified Medications   No medications on file  Discontinued Medications   No medications on file

## 2015-05-02 NOTE — Progress Notes (Signed)
Pre visit review using our clinic review tool, if applicable. No additional management support is needed unless otherwise documented below in the visit note. 

## 2015-05-22 ENCOUNTER — Ambulatory Visit
Admission: RE | Admit: 2015-05-22 | Discharge: 2015-05-22 | Disposition: A | Discharge status: Home / Self Care | Payer: Federal, State, Local not specified - PPO | Source: Ambulatory Visit | Attending: Family Medicine | Admitting: Family Medicine

## 2015-05-22 DIAGNOSIS — M25551 Pain in right hip: Secondary | ICD-10-CM

## 2015-05-22 MED ORDER — IOHEXOL 180 MG/ML  SOLN
10.0000 mL | Freq: Once | INTRAMUSCULAR | Status: DC | PRN
  Administered 2015-05-22: 10 mL via INTRA_ARTICULAR

## 2015-05-23 ENCOUNTER — Ambulatory Visit: Payer: Federal, State, Local not specified - PPO | Admitting: Family Medicine

## 2015-05-31 ENCOUNTER — Ambulatory Visit (INDEPENDENT_AMBULATORY_CARE_PROVIDER_SITE_OTHER): Payer: Federal, State, Local not specified - PPO | Admitting: Family Medicine

## 2015-05-31 ENCOUNTER — Encounter: Payer: Self-pay | Admitting: Family Medicine

## 2015-05-31 VITALS — BP 110/66 | HR 79 | Temp 98.5°F | Ht 64.0 in | Wt 162.5 lb

## 2015-05-31 DIAGNOSIS — M1611 Unilateral primary osteoarthritis, right hip: Secondary | ICD-10-CM | POA: Diagnosis not present

## 2015-05-31 DIAGNOSIS — M25551 Pain in right hip: Secondary | ICD-10-CM

## 2015-05-31 MED ORDER — METHYLPREDNISOLONE ACETATE 40 MG/ML IJ SUSP
80.0000 mg | Freq: Once | INTRAMUSCULAR | Status: AC
  Administered 2015-05-31: 80 mg via INTRA_ARTICULAR

## 2015-05-31 NOTE — Progress Notes (Signed)
Pre visit review using our clinic review tool, if applicable. No additional management support is needed unless otherwise documented below in the visit note. 

## 2015-05-31 NOTE — Progress Notes (Signed)
Dr. Karleen Hampshire T. Copland, MD, CAQ [facility] [address] [place], [address] Phone: [phone] Fax: [phone]  05/31/2015  Patient: Barbara Faulkner, MRN: [id number], DOB: 17-Apr-1966, 49 y.o.  Primary Physician:  Ruthe Mannan, MD  Chief Complaint: Follow-up  Subjective:   Barbara Faulkner is a 49 y.o. very pleasant female patient who presents with the following:  R hip: tthe patient returns in follow-up regarding her right hip with the history detailed below.  She has lost 140 pounds post bariatric surgery and has become quite active.  She has been limited the last few months with her right-sided hip pain.  On her last office visit I had concern that the patient had a labral tear or even a potential stress fracture, but her follow-up MR arthrogram showed neither of these.  She does have some degenerative changes on her hip which we reviewed together.  Overall, she feels reasonably okay right now, with some terminal pain, but she has not been doing any martial arts.  05/02/2015 Last OV with Hannah Beat, MD  Last July, got off the bed wrong and started to feel a pop and ain in the groin. Feels it in the pback.   Wt Readings from Last 3 Encounters:  05/31/15 162 lb 8 oz (73.71 kg)  05/02/15 162 lb 4 oz (73.596 kg)  04/18/15 164 lb (74.39 kg)    Very physically active female who is status post bariatric surgery in July 2015. In July 2015 the patient got off of her bed wrong and felt a pop and an unusual sensation in her groin, and ever since then she has had pain with terminal motion and movement.  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 At this point she is effectively not able to do any kind of kicking on the right with her right leg during martial arts. Pain with rotational movements.   Past Medical History, Surgical History, Social History, Family History, Problem List, Medications, and Allergies have been reviewed and updated if  relevant.  Patient Active Problem List   Diagnosis Date Noted  . Right hip pain 04/18/2015  . S/P bariatric surgery 09/25/2014  . Routine general medical examination at a health care facility 09/13/2013  . Psoriatic arthritis 09/13/2013  . DUB (dysfunctional uterine bleeding) 10/27/2012  . 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 Lyme disease 06/22/2012  . 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 SHELLFISH ALLERGY 09/27/2010  . THYROMEGALY 09/03/2010  . ALLERGIC RHINITIS 03/15/2010  . MORBID OBESITY 05/17/2008  . Adjustment disorder with mixed anxiety and depressed mood 05/17/2008  . MIGRAINE W/AURA W/O INTRACT W/O STATUS MIGRNOSUS 05/17/2008    Past Medical History  Diagnosis Date  . Allergy   . Depression   . Migraine     Past Surgical History  Procedure Laterality Date  . Cholecystectomy  2009  . Tubal ligation  1997  . Laparoscopic gastric banding  2008     removed 2009  . Gastric bypass      2015    Social History   Social History  . Marital Status: Married    Spouse Name: N/A  . Number of Children: 2  . Years of Education: N/A   Occupational History  . [employer], Programmer, applications    Social History Main Topics  . Smoking status: Former Smoker -- 2.00 packs/day for 20 years    Types: Cigarettes    Quit date: 09/02/2003  .  Smokeless tobacco: Never Used  . Alcohol Use: No  . Drug Use: No  . Sexual Activity: Not on file   Other Topics Concern  . Not on file   Social History Narrative    Family History  Problem Relation Age of Onset  . Hypertension Mother   . Hyperlipidemia Mother   . HIV Father   . Lung cancer Maternal Grandfather     smoker  . Hepatitis Maternal Uncle     drug use  . Colon cancer Neg Hx   . Colon polyps Neg Hx   . Rectal cancer Neg Hx   . Stomach cancer Neg Hx     Allergies  Allergen Reactions  . Shellfish Allergy Swelling    REACTION: throat closing  . Hydrocod Polst-Cpm Polst Er Other (See  Comments)    Bad dreams    Medication list reviewed and updated in full in [facility].  GEN: No fevers, chills. Nontoxic. Primarily MSK c/o today. MSK: Detailed in the HPI GI: tolerating PO intake without difficulty Neuro: No numbness, parasthesias, or tingling associated. Otherwise the pertinent positives of the ROS are noted above.   Objective:   BP 110/66 mmHg  Pulse 79  Temp(Src) 98.5 F (36.9 C) (Oral)  Ht  (1.626 m)  Wt 162 lb 8 oz (73.71 kg)  BMI 27.88 kg/m2  LMP 04/02/2014   GEN: WDWN, NAD, Non-toxic, Alert & Oriented x 3 HEENT: Atraumatic, Normocephalic.  Ears and Nose: No external deformity. EXTR: No clubbing/cyanosis/edema NEURO: Normal gait.  PSYCH: Normally interactive. Conversant. Not depressed or anxious appearing.  Calm demeanor.   HIP EXAM: SIDE: R ROM: Abduction, Flexion, Internal and External range of motion: notable pain and restriction at [facility] AND IROM Abduction preserved Pain with terminal IROM and EROM: yes MARKEDLY TENDER WITH HIP FLEXION, ADDUCTION AND IROM GTB: minimally tender SLR: NEG Knees: No effusion FABER: NT REVERSE FABER: NT, neg Piriformis: NT at direct palpation Str: flexion: 5/5 abduction: 5/5 adduction: 4+/5 Strength testing non-tender   Radiology: Mr Hip Right W Contrast  05/22/2015   CLINICAL DATA:  Right hip pain for 1 year.  EXAM: MRI OF THE RIGHT HIP WITH CONTRAST(MR Arthrogram)  TECHNIQUE: Multiplanar, multisequence MR imaging of the hip was performed immediately following contrast injection into the hip joint under fluoroscopic guidance. No intravenous contrast was administered.  COMPARISON:  None.  FINDINGS: Bone  No fracture, dislocation or avascular necrosis. Normal sacrum and sacroiliac joints.  Alignment  Normal. No subluxation.  Dysplasia  None.  Joint effusion  Intraarticular contrast.  Labrum  Normal. No labral tear.  Cartilage  Femoral cartilage: Small area of cartilage loss with small area of  subchondral cystic change in the superior medial femoral head.  Acetabular cartilage: Normal.  Capsule and ligaments  Normal.  Muscles and Tendons  Flexors: Normal.  Extensors: Normal.  Abductors: Normal.  Adductors: Normal.  Rotators: Normal.  Hamstrings: Normal.  Other Findings  None  Viscera  Normal. No abnormality seen in pelvis. No lymphadenopathy. No free fluid in the pelvis.  IMPRESSION: 1. No evidence of a stress fracture of the right hip. 2. No right labral tear. 3. Small area of cartilage loss with small area of subchondral cystic change in the superior medial femoral head.   Electronically Signed   By: Elige Ko   On: 05/22/2015 16:46   Dg Fluoro Guide Ndl Plcd/bx/inj/loc  05/22/2015   CLINICAL DATA:  Labral tear. Stress fracture. Initial encounter. RIGHT hip pain. Personal history  of psoriatic arthritis.  EXAM: HIP INJECTION FOR MRI  FLUOROSCOPY TIME:  0 minutes 22 seconds  PROCEDURE: After a thorough discussion of risks and benefits of the procedure, written and oral informed consent was obtained. The consent discussion included off label use of Multihance, the risk of bleeding, infection and injury to nerves and adjacent blood vessels. Extra-articular injection was also a possible risk discussed. Verbal consent was obtained by Dr. Carlota Raspberry.  Preliminary localization was performed over the RIGHT hip. The area was marked over the junction of the left femoral head and neck.  After prep and drape in the usual sterile fashion, the skin and deeper subcutaneous tissues were anesthetized with 1% Lidocaine without Epinephrine. Under fluoroscopic guidance, a 22 gauge 3.5 inch spinal needle was advanced into the joint at the lateral margin of the junction of the femoral head and neck. Intra-articular injection of Lidocaine was performed which flowed freely and subsequently the joint was distended with 10 ml of a 1:400 dilution of Multihance contrast. The MR arthrogram solution was as follows: 15 ml of  omnipaque 180 contrast agent, 0.05 mL Multihance, 5 ml of 1% Lidocaine. An end point was felt as well as the patient experiencing pressure and the injection was discontinued, the needle removed, and a sterile dressing applied. The patient was taken to MRI for subsequent imaging.  The patient tolerated the procedure well and there were no complications.  IMPRESSION: Successful RIGHT hip fluoroscopically guided injection.   Electronically Signed   By: Andreas Newport M.D.   On: 05/22/2015 16:15   Assessment and Plan:   Primary osteoarthritis of right hip  Right hip pain - Plan: methylPREDNISolone acetate (DEPO-MEDROL) injection 80 mg  I'm going to let the patient return gradually to exercise including weight lifting, high intensity exercise and Krav Maga.   Her hip may limit her somewhat.  Particularly with kicking.  I think that she will be okay, limiting her salt basically based on her own amount of pain.  No labral tear.  She does have some arthritic changes on her MRI, which I think is probably the primary driver of her pain when she is torquing her hip during martial arts.  No NSAIDs post bariatric surgery.  Tylenol certainly appropriate.  Rest, ice, heat, massage.   Also gave her a hip rehabilitation program that she can do gently.  We also discussed other treatment options, and think that a trial of a intra-articular corticosteroid injection would be reasonable.  Ultrasound Guided Hip Injection, R Terason t3000, MSK ultrasound, MSK probe Anatomy scanned: R hip Indication: Pain, arthritis Findings:  Limited to anterior anatomy for injection. Ultrasound guided hip injection. Verbal consent was obtained. Potential complications including infection, bleeding, anesthesia to nerve were all discussed with the patient, and she would like to proceed. Using ultrasound machine, the intra-articular space was located. A 22-gauge spinal needle was visualized going to the joint capsule, and fluid was  observed entering the joint capsule. The combination of 8 cc of lidocaine 1% and Depo-Medrol 80 mg, equaling Depo-Medrol 80 mg was injected into the intra-articular hip space. The patient tolerated the procedure well and had almost immediate improvement of pain symptoms. No complications. Procedure was tolerated well and captured on machine.  Signed,  Elpidio Galea. Copland, MD   Patient's Medications  New Prescriptions   No medications on file  Previous Medications   CALCIUM-MAGNESIUM-ZINC 167-83-8 MG TABS    Take 1 tablet by mouth.   CHOLECALCIFEROL (VITAMIN D) 2000 UNITS CAPS  Take 3 capsules by mouth daily.   EPINEPHRINE (EPIPEN 2-PAK) 0.3 MG/0.3 ML DEVI    Inject as directed and then go to ER    IRON-FOLIC ACID-VIT B12 (IRON FORMULA PO)    Take 1 tablet by mouth daily.   MULTIPLE VITAMIN (MULTIVITAMIN) TABLET    Take 1 tablet by mouth daily.   PAROXETINE (PAXIL) 20 MG TABLET    Take 1 tablet (20 mg total) by mouth every morning.   TRAMADOL (ULTRAM) 50 MG TABLET    TAKE 1 TABLET BY MOUTH EVERY 6 HOURS AS NEEDED  Modified Medications   No medications on file  Discontinued Medications   No medications on file

## 2015-06-18 ENCOUNTER — Other Ambulatory Visit: Payer: Self-pay | Admitting: Family Medicine

## 2015-06-18 NOTE — Telephone Encounter (Signed)
Unsure of which Dx is pt receiving med for?

## 2015-06-18 NOTE — Telephone Encounter (Signed)
Rx called in to requested pharmacy 

## 2015-08-31 ENCOUNTER — Other Ambulatory Visit: Payer: Self-pay | Admitting: Family Medicine

## 2015-08-31 NOTE — Telephone Encounter (Signed)
Pt last saw dr copland for hip pain 05/2015

## 2015-08-31 NOTE — Telephone Encounter (Signed)
Rx called in to requested pharmacy 

## 2015-09-05 ENCOUNTER — Other Ambulatory Visit: Payer: Self-pay | Admitting: Family Medicine

## 2015-10-25 ENCOUNTER — Other Ambulatory Visit: Payer: Self-pay | Admitting: Family Medicine

## 2015-10-25 NOTE — Telephone Encounter (Signed)
Spoke to pt and advised OV required. Rx denied, as pt states she has enough meds to last until f/u 2/27

## 2015-10-29 ENCOUNTER — Encounter: Payer: Self-pay | Admitting: Family Medicine

## 2015-10-29 ENCOUNTER — Ambulatory Visit (INDEPENDENT_AMBULATORY_CARE_PROVIDER_SITE_OTHER): Payer: Federal, State, Local not specified - PPO | Admitting: Family Medicine

## 2015-10-29 VITALS — BP 128/76 | HR 78 | Temp 97.9°F | Wt 163.0 lb

## 2015-10-29 DIAGNOSIS — Z9884 Bariatric surgery status: Secondary | ICD-10-CM | POA: Diagnosis not present

## 2015-10-29 DIAGNOSIS — L4050 Arthropathic psoriasis, unspecified: Secondary | ICD-10-CM | POA: Diagnosis not present

## 2015-10-29 DIAGNOSIS — F4323 Adjustment disorder with mixed anxiety and depressed mood: Secondary | ICD-10-CM

## 2015-10-29 DIAGNOSIS — M25571 Pain in right ankle and joints of right foot: Secondary | ICD-10-CM | POA: Insufficient documentation

## 2015-10-29 MED ORDER — PAROXETINE HCL 20 MG PO TABS: 20.0000 mg | ORAL_TABLET | Freq: Every day | ORAL | Status: DC

## 2015-10-29 MED ORDER — TRAMADOL HCL 50 MG PO TABS: 50.0000 mg | ORAL_TABLET | Freq: Four times a day (QID) | ORAL | Status: DC | PRN

## 2015-10-29 NOTE — Assessment & Plan Note (Signed)
Unclear if related to her PA. Will order uric acid today. The patient indicates understanding of these issues and agrees with the plan.

## 2015-10-29 NOTE — Assessment & Plan Note (Signed)
She is refusing MTX or biological. Has been taking prn tramadol.  Feels symptoms improved since she lost 140 lb. Tramadol rx refilled and given to pt today.

## 2015-10-29 NOTE — Assessment & Plan Note (Signed)
Stable on current dose of paxil. No changes made to rxs today. eRx refills sent.

## 2015-10-29 NOTE — Progress Notes (Signed)
Subjective:   Patient ID: Barbara Faulkner, female    DOB: 03/12/1966, 50 y.o.   MRN: [id number]  Barbara Faulkner is a pleasant 50 y.o. year old female who presents to clinic today with Follow-up  on 10/29/2015  HPI:  Psoriatic arthtitis- on MTX which was helping initially but now only taking prn Tramadol which has been more effective. She is asking for a refill of this today.   Obesity-has lost 140  lbs s/p bariatric surgery 01/2014.  Wt Readings from Last 3 Encounters:  10/29/15 163 lb (73.936 kg)  05/31/15 162 lb 8 oz (73.71 kg)  05/02/15 162 lb 4 oz (73.596 kg)     When I saw her last year, she was having more stressors at work and asked to restart paxil.  She feels paxil is working well at current dose.  She denies any symptoms of anxiety or depression.  She wonders if she has gout.  Right 2nd toe often gets really red and painful to touch on the joint.  Currently asymptomatic but had symptoms last week.  No other joints have been effected that she is aware of.  Current Outpatient Prescriptions on File Prior to Visit  Medication Sig Dispense Refill  . Calcium-Magnesium-Zinc 167-83-8 MG TABS Take 1 tablet by mouth.    . Cholecalciferol (VITAMIN D) 2000 UNITS CAPS Take 3 capsules by mouth daily.    [name] EPINEPHrine (EPIPEN 2-PAK) 0.3 mg/0.3 mL DEVI Inject as directed and then go to ER     . Iron-Folic Acid-Vit B12 (IRON FORMULA PO) Take 1 tablet by mouth daily.    . Multiple Vitamin (MULTIVITAMIN) tablet Take 1 tablet by mouth daily.    [name] PARoxetine (PAXIL) 20 MG tablet Take 1 tablet (20 mg total) by mouth daily. OFFICE VISIT REQUIRED FOR ADDITIONAL REFILLS 30 tablet 0  . traMADol (ULTRAM) 50 MG tablet TAKE 1 TABLET BY MOUTH EVERY 6 HOURS AS NEEDED 60 tablet 0   No current facility-administered medications on file prior to visit.    Allergies  Allergen Reactions  . Shellfish Allergy Swelling    REACTION: throat closing  . Hydrocod Polst-Cpm Polst Er Other (See Comments)    Bad  dreams    Past Medical History  Diagnosis Date  . Allergy   . Depression   . Migraine     Past Surgical History  Procedure Laterality Date  . Cholecystectomy  2009  . Tubal ligation  1997  . Laparoscopic gastric banding  2008     removed 2009  . Gastric bypass      2015    Family History  Problem Relation Age of Onset  . Hypertension Mother   . Hyperlipidemia Mother   . HIV Father   . Lung cancer Maternal Grandfather     smoker  . Hepatitis Maternal Uncle     drug use  . Colon cancer Neg Hx   . Colon polyps Neg Hx   . Rectal cancer Neg Hx   . Stomach cancer Neg Hx     Social History   Social History  . Marital Status: Married    Spouse Name: N/A  . Number of Children: 2  . Years of Education: N/A   Occupational History  . [employer], Programmer, applications    Social History Main Topics  . Smoking status: Former Smoker -- 2.00 packs/day for 20 years    Types: Cigarettes    Quit date: 09/02/2003  . Smokeless tobacco: Never Used  . Alcohol Use: No  .  Drug Use: No  . Sexual Activity: Not on file   Other Topics Concern  . Not on file   Social History Narrative   The PMH, PSH, Social History, Family History, Medications, and allergies have been reviewed in [facility], and have been updated if relevant.    Review of Systems  HENT: Negative.   Respiratory: Negative.   Cardiovascular: Negative.   Genitourinary: Negative.   Musculoskeletal: Positive for arthralgias.  Skin: Positive for rash.  Neurological: Negative.   Psychiatric/Behavioral: Negative.   All other systems reviewed and are negative.      Objective:    BP 128/76 mmHg  Pulse 78  Temp(Src) 97.9 F (36.6 C) (Oral)  Wt 163 lb (73.936 kg)  SpO2 98%  LMP 04/02/2014   Physical Exam  Constitutional: She is oriented to person, place, and time. She appears well-developed and well-nourished. No distress.  HENT:  Head: Normocephalic.  Eyes: Conjunctivae are normal.  Cardiovascular: Normal rate.     Pulmonary/Chest: Effort normal.  Musculoskeletal: Normal range of motion.  Neurological: She is alert and oriented to person, place, and time. No cranial nerve deficit.  Skin: Skin is warm and dry.  Psychiatric: She has a normal mood and affect. Her behavior is normal. Judgment and thought content normal.  Nursing note and vitals reviewed.         Assessment & Plan:   Psoriatic arthritis (HCC)  S/P bariatric surgery  Adjustment disorder with mixed anxiety and depressed mood No Follow-up on file.

## 2015-10-29 NOTE — Progress Notes (Signed)
Pre visit review using our clinic review tool, if applicable. No additional management support is needed unless otherwise documented below in the visit note. 

## 2015-10-30 LAB — URIC ACID: Uric Acid, Serum: 4.8 mg/dL (ref 2.4–7.0)

## 2015-10-31 ENCOUNTER — Encounter: Payer: Self-pay | Admitting: *Deleted

## 2015-11-26 ENCOUNTER — Ambulatory Visit (INDEPENDENT_AMBULATORY_CARE_PROVIDER_SITE_OTHER)
Admission: RE | Admit: 2015-11-26 | Discharge: 2015-11-26 | Disposition: A | Discharge status: Home / Self Care | Payer: Federal, State, Local not specified - PPO | Source: Ambulatory Visit | Attending: Family Medicine | Admitting: Family Medicine

## 2015-11-26 ENCOUNTER — Encounter: Payer: Self-pay | Admitting: Family Medicine

## 2015-11-26 ENCOUNTER — Ambulatory Visit (INDEPENDENT_AMBULATORY_CARE_PROVIDER_SITE_OTHER): Payer: Federal, State, Local not specified - PPO | Admitting: Family Medicine

## 2015-11-26 VITALS — BP 122/70 | HR 70 | Temp 97.4°F | Wt 164.0 lb

## 2015-11-26 DIAGNOSIS — J069 Acute upper respiratory infection, unspecified: Secondary | ICD-10-CM | POA: Insufficient documentation

## 2015-11-26 NOTE — Assessment & Plan Note (Signed)
[facility] Lung exam reassuring.  Advised cough can linger for weeks.  Given chills and sweats, will get CXR today for further evaluation. No further rx given today. The patient indicates understanding of these issues and agrees with the plan.

## 2015-11-26 NOTE — Progress Notes (Signed)
Pre visit review using our clinic review tool, if applicable. No additional management support is needed unless otherwise documented below in the visit note. 

## 2015-11-26 NOTE — Progress Notes (Signed)
Subjective:   Patient ID: Durenda AgeLisa Milnes, female    DOB: 03/21/1966, 50 y.o.   MRN: [id number]  Durenda AgeLisa Lisowski is a pleasant 50 y.o. year old female who presents to clinic today with Follow-up  on 11/26/2015  HPI:  [facility]   Treated with Zpack.  No CXR done.  Per pt, told she had pneumonia.    Overall, symptoms improving but still has a cough that is sometimes productive and chest tightness.  Last night, also broke out into a cold sweat.  No fevers recently.  Current Outpatient Prescriptions on File Prior to Visit  Medication Sig Dispense Refill  . Calcium-Magnesium-Zinc 167-83-8 MG TABS Take 1 tablet by mouth.    . Cholecalciferol (VITAMIN D) 2000 UNITS CAPS Take 3 capsules by mouth daily.    [name]. EPINEPHrine (EPIPEN 2-PAK) 0.3 mg/0.3 mL DEVI Inject as directed and then go to ER     . Iron-Folic Acid-Vit B12 (IRON FORMULA PO) Take 1 tablet by mouth daily.    . Multiple Vitamin (MULTIVITAMIN) tablet Take 1 tablet by mouth daily.    [name]. PARoxetine (PAXIL) 20 MG tablet Take 1 tablet (20 mg total) by mouth daily. 30 tablet 6  . traMADol (ULTRAM) 50 MG tablet Take 1 tablet (50 mg total) by mouth every 6 (six) hours as needed. 60 tablet 0   No current facility-administered medications on file prior to visit.    Allergies  Allergen Reactions  . Shellfish Allergy Swelling    REACTION: throat closing  . Hydrocod Polst-Cpm Polst Er Other (See Comments)    Bad dreams    Past Medical History  Diagnosis Date  . Allergy   . Depression   . Migraine     Past Surgical History  Procedure Laterality Date  . Cholecystectomy  2009  . Tubal ligation  1997  . Laparoscopic gastric banding  2008     removed 2009  . Gastric bypass      2015    Family History  Problem Relation Age of Onset  . Hypertension Mother   . Hyperlipidemia Mother   . HIV Father   . Lung cancer Maternal Grandfather     smoker  . Hepatitis Maternal Uncle     drug use  . Colon cancer Neg  Hx   . Colon polyps Neg Hx   . Rectal cancer Neg Hx   . Stomach cancer Neg Hx     Social History   Social History  . Marital Status: Married    Spouse Name: N/A  . Number of Children: 2  . Years of Education: N/A   Occupational History  . [employer], Programmer, [employer]    Social History Main Topics  . Smoking status: Former Smoker -- 2.00 packs/day for 20 years    Types: Cigarettes    Quit date: 09/02/2003  . Smokeless tobacco: Never Used  . Alcohol Use: No  . Drug Use: No  . Sexual Activity: Not on file   Other Topics Concern  . Not on file   Social History Narrative   The PMH, PSH, Social History, Family History, Medications, and allergies [facility], and have been updated if relevant.   Review of Systems  Constitutional: Positive for diaphoresis.  HENT: Negative.   Respiratory: Positive for cough and wheezing. Negative for shortness of breath and stridor.   Cardiovascular: Negative.   Gastrointestinal: Negative.   Endocrine: Negative.   Genitourinary: Negative.   Musculoskeletal: Negative.  Skin: Negative.   Neurological: Negative.   Hematological: Negative.   Psychiatric/Behavioral: Negative.   All other systems reviewed and are negative.      Objective:    BP 122/70 mmHg  Pulse 70  Temp(Src) 97.4 F (36.3 C) (Oral)  Wt 164 lb (74.39 kg)  SpO2 98%  LMP 04/02/2014   Physical Exam  Constitutional: She is oriented to person, place, and time. She appears well-developed and well-nourished. No distress.  HENT:  Head: Normocephalic and atraumatic.  Eyes: Conjunctivae are normal.  Neck: Normal range of motion.  Cardiovascular: Normal rate, regular rhythm and normal heart sounds.   Pulmonary/Chest: Effort normal and breath sounds normal. No respiratory distress. She has no wheezes.  Musculoskeletal: Normal range of motion.  Neurological: She is alert and oriented to person, place, and time. No cranial nerve deficit.  Skin: Skin is warm and  dry. She is not diaphoretic.  Psychiatric: She has a normal mood and affect. Her behavior is normal. Judgment and thought content normal.  Nursing note and vitals reviewed.         Assessment & Plan:   Acute upper respiratory infection - Plan: DG Chest 2 View No Follow-up on file.

## 2015-12-11 DIAGNOSIS — H35413 Lattice degeneration of retina, bilateral: Secondary | ICD-10-CM | POA: Diagnosis not present

## 2015-12-24 ENCOUNTER — Other Ambulatory Visit: Payer: Self-pay | Admitting: Family Medicine

## 2015-12-24 NOTE — Telephone Encounter (Signed)
Last f/u 09/2014. Pt has only been seen for acute outside of that

## 2015-12-24 NOTE — Telephone Encounter (Signed)
Rx called in to requested pharmacy 

## 2016-01-23 ENCOUNTER — Other Ambulatory Visit: Payer: Self-pay | Admitting: Family Medicine

## 2016-01-23 NOTE — Telephone Encounter (Signed)
Rx called in to requested pharmacy 

## 2016-01-23 NOTE — Telephone Encounter (Signed)
Pt has only been seen for acute issues

## 2016-02-27 DIAGNOSIS — Z5181 Encounter for therapeutic drug level monitoring: Secondary | ICD-10-CM | POA: Diagnosis not present

## 2016-02-27 DIAGNOSIS — R638 Other symptoms and signs concerning food and fluid intake: Secondary | ICD-10-CM | POA: Diagnosis not present

## 2016-02-27 DIAGNOSIS — K912 Postsurgical malabsorption, not elsewhere classified: Secondary | ICD-10-CM | POA: Diagnosis not present

## 2016-02-27 DIAGNOSIS — Z9884 Bariatric surgery status: Secondary | ICD-10-CM | POA: Diagnosis not present

## 2016-03-26 ENCOUNTER — Encounter: Payer: Self-pay | Admitting: Family Medicine

## 2016-03-26 ENCOUNTER — Ambulatory Visit (INDEPENDENT_AMBULATORY_CARE_PROVIDER_SITE_OTHER): Payer: Federal, State, Local not specified - PPO | Admitting: Family Medicine

## 2016-03-26 VITALS — BP 124/84 | HR 73 | Temp 97.9°F | Wt 165.5 lb

## 2016-03-26 DIAGNOSIS — R809 Proteinuria, unspecified: Secondary | ICD-10-CM | POA: Insufficient documentation

## 2016-03-26 DIAGNOSIS — J0110 Acute frontal sinusitis, unspecified: Secondary | ICD-10-CM

## 2016-03-26 DIAGNOSIS — R899 Unspecified abnormal finding in specimens from other organs, systems and tissues: Secondary | ICD-10-CM | POA: Insufficient documentation

## 2016-03-26 LAB — POC URINALSYSI DIPSTICK (AUTOMATED)
Bilirubin, UA: NEGATIVE
Blood, UA: NEGATIVE
Glucose, UA: NEGATIVE
Ketones, UA: NEGATIVE
LEUKOCYTES UA: NEGATIVE
NITRITE UA: NEGATIVE
UROBILINOGEN UA: 0.2
pH, UA: 6

## 2016-03-26 LAB — COMPREHENSIVE METABOLIC PANEL
ALK PHOS: 93 U/L (ref 39–117)
ALT: 33 U/L (ref 0–35)
AST: 34 U/L (ref 0–37)
Albumin: 3.9 g/dL (ref 3.5–5.2)
BUN: 15 mg/dL (ref 6–23)
CALCIUM: 8.4 mg/dL (ref 8.4–10.5)
CO2: 25 meq/L (ref 19–32)
Chloride: 112 mEq/L (ref 96–112)
Creatinine, Ser: 0.96 mg/dL (ref 0.40–1.20)
GFR: 79.84 mL/min (ref >60.00)
GLUCOSE: 90 mg/dL (ref 70–99)
POTASSIUM: 3.1 meq/L — AB (ref 3.5–5.1)
Sodium: 144 mEq/L (ref 135–145)
Total Bilirubin: 0.4 mg/dL (ref 0.2–1.2)
Total Protein: 6.1 g/dL (ref 6.0–8.3)

## 2016-03-26 MED ORDER — AMOXICILLIN-POT CLAVULANATE 875-125 MG PO TABS: 1.0000 | ORAL_TABLET | Freq: Two times a day (BID) | ORAL | 0 refills | Status: DC

## 2016-03-26 NOTE — Progress Notes (Signed)
Pre visit review using our clinic review tool, if applicable. No additional management support is needed unless otherwise documented below in the visit note. 

## 2016-03-26 NOTE — Assessment & Plan Note (Signed)
Given duration and progression of symptoms, will treat for bacterial sinusitis with Augmentin. Call or return to clinic prn if these symptoms worsen or fail to improve as anticipated. The patient indicates understanding of these issues and agrees with the plan.

## 2016-03-26 NOTE — Assessment & Plan Note (Signed)
New- with decreased GFR and Cr at outside lab. Recheck CMET. 24 hour urine. The patient indicates understanding of these issues and agrees with the plan.

## 2016-03-26 NOTE — Progress Notes (Signed)
Subjective:   Patient ID: Barbara Faulkner, female    DOB: [birth year], 50 y.o.   MRN: [id number]  Barbara Faulkner is a pleasant 50 y.o. year old female who presents to clinic today with Follow-up (discuss labs)  on 03/26/2016  HPI:  Abnormal labs- bariatric surgeon checked labs.  Brings them in today.  Cr was 1.20 and EGFR was 48.  Drinks a lot of water.  She is concerned about her kidneys.  No family h/o kidney disease.  Does not take NSAIDs regularly.  ? Sinusitis- 2 weeks of sinus pressure. No fever.  Has had some chills.  Current Outpatient Prescriptions on File Prior to Visit  Medication Sig Dispense Refill  . Calcium-Magnesium-Zinc 167-83-8 MG TABS Take 1 tablet by mouth.    . Cholecalciferol (VITAMIN D) 2000 UNITS CAPS Take 3 capsules by mouth daily.    [name] EPINEPHrine (EPIPEN 2-PAK) 0.3 mg/0.3 mL DEVI Inject as directed and then go to ER     . Iron-Folic Acid-Vit J44 (IRON FORMULA PO) Take 1 tablet by mouth daily.    . Multiple Vitamin (MULTIVITAMIN) tablet Take 1 tablet by mouth daily.    [name] PARoxetine (PAXIL) 20 MG tablet Take 1 tablet (20 mg total) by mouth daily. 30 tablet 6  . traMADol (ULTRAM) 50 MG tablet TAKE 1 TABLET BY MOUTH EVERY 6 HOURS AS NEEDED 60 tablet 0   No current facility-administered medications on file prior to visit.     Allergies  Allergen Reactions  . Shellfish Allergy Swelling    REACTION: throat closing  . Hydrocod Polst-Cpm Polst Er Other (See Comments)    Bad dreams    Past Medical History:  Diagnosis Date  . Allergy   . Depression   . Migraine     Past Surgical History:  Procedure Laterality Date  . CHOLECYSTECTOMY  2009  . GASTRIC BYPASS     2015  . LAPAROSCOPIC GASTRIC BANDING  2008    removed 2009  . TUBAL LIGATION  1997    Family History  Problem Relation Age of Onset  . Hypertension Mother   . Hyperlipidemia Mother   . HIV Father   . Lung cancer Maternal Grandfather     smoker  . Hepatitis Maternal Uncle     drug use  .  Colon cancer Neg Hx   . Colon polyps Neg Hx   . Rectal cancer Neg Hx   . Stomach cancer Neg Hx     Social History   Social History  . Marital status: Married    Spouse name: N/A  . Number of children: 2  . Years of education: N/A   Occupational History  . [employer], [employer]   Social History Main Topics  . Smoking status: Former Smoker    Packs/day: 2.00    Years: 20.00    Types: Cigarettes    Quit date: 09/02/2003  . Smokeless tobacco: Never Used  . Alcohol use No  . Drug use: No  . Sexual activity: Not on file   Other Topics Concern  . Not on file   Social History Narrative  . No narrative on file   The PMH, PSH, Social History, Family History, Medications, and allergies have been reviewed in [facility], and have been updated if relevant.    Review of Systems  Constitutional: Positive for chills and fatigue. Negative for fever.  HENT: Positive for sinus pressure. Negative for sneezing, sore throat, tinnitus, trouble swallowing and voice change.  Respiratory: Negative.   Cardiovascular: Negative.   Gastrointestinal: Negative.   Genitourinary: Negative.   Musculoskeletal: Negative.   Allergic/Immunologic: Negative.   Neurological: Negative.   Hematological: Negative.   Psychiatric/Behavioral: Negative.   All other systems reviewed and are negative.      Objective:    BP 124/84   Pulse 73   Temp 97.9 F (36.6 C) (Oral)   Wt 165 lb 8 oz (75.1 kg)   LMP 04/02/2014   SpO2 98%   BMI 28.41 kg/m    Physical Exam  Constitutional: She is oriented to person, place, and time. She appears well-developed and well-nourished. No distress.  HENT:  Head: Normocephalic and atraumatic.  Right Ear: Hearing and tympanic membrane normal.  Left Ear: Hearing and tympanic membrane normal.  Nose: Right sinus exhibits frontal sinus tenderness. Left sinus exhibits frontal sinus tenderness.  Mouth/Throat: Uvula is midline, oropharynx is clear and moist and  mucous membranes are normal.  Cardiovascular: Normal rate and regular rhythm.   Pulmonary/Chest: Effort normal and breath sounds normal.  Abdominal: Soft. She exhibits no distension. There is no tenderness.  Musculoskeletal: Normal range of motion.  Neurological: She is alert and oriented to person, place, and time. No cranial nerve deficit.  Skin: Skin is warm and dry. She is not diaphoretic.  Psychiatric: She has a normal mood and affect. Her behavior is normal. Judgment and thought content normal.  Nursing note and vitals reviewed.         Assessment & Plan:   Acute frontal sinusitis, recurrence not specified  Abnormal laboratory test result No Follow-up on file.

## 2016-03-26 NOTE — Addendum Note (Signed)
Addended by: Desmond Dike on: 03/26/2016 01:18 PM   Modules accepted: Orders

## 2016-04-17 ENCOUNTER — Other Ambulatory Visit: Payer: Self-pay | Admitting: Family Medicine

## 2016-04-17 NOTE — Telephone Encounter (Signed)
Pt last f/u Dr Patsy Lageropland for hip pain 05/2015

## 2016-04-18 NOTE — Telephone Encounter (Signed)
Rx called in to pharmacy. 

## 2016-05-01 ENCOUNTER — Ambulatory Visit (INDEPENDENT_AMBULATORY_CARE_PROVIDER_SITE_OTHER): Payer: Federal, State, Local not specified - PPO | Admitting: Family Medicine

## 2016-05-01 VITALS — BP 124/60 | HR 67 | Temp 97.8°F | Wt 167.8 lb

## 2016-05-01 DIAGNOSIS — Z23 Encounter for immunization: Secondary | ICD-10-CM | POA: Diagnosis not present

## 2016-05-01 DIAGNOSIS — K1120 Sialoadenitis, unspecified: Secondary | ICD-10-CM | POA: Diagnosis not present

## 2016-05-01 MED ORDER — FLUCONAZOLE 150 MG PO TABS: 150.0000 mg | ORAL_TABLET | Freq: Once | ORAL | 0 refills | Status: AC

## 2016-05-01 MED ORDER — AMOXICILLIN-POT CLAVULANATE 875-125 MG PO TABS: 1.0000 | ORAL_TABLET | Freq: Two times a day (BID) | ORAL | 0 refills | Status: DC

## 2016-05-01 NOTE — Assessment & Plan Note (Signed)
New- advised sucking on sour candies, NSAIDs. eRx sent for Augmentin. Call or return to clinic prn if these symptoms worsen or fail to improve as anticipated. The patient indicates understanding of these issues and agrees with the plan.

## 2016-05-01 NOTE — Progress Notes (Signed)
Pre visit review using our clinic review tool, if applicable. No additional management support is needed unless otherwise documented below in the visit note. 

## 2016-05-01 NOTE — Patient Instructions (Signed)
Parotitis Parotitis is soreness and inflammation of one or both parotid glands. The parotid glands produce saliva. They are located on each side of the face, below and in front of the earlobes. The saliva produced comes out of tiny openings (ducts) inside the cheeks. In most cases, parotitis goes away over time or with treatment. If your parotitis is caused by certain long-term (chronic) diseases, it may come back again.  CAUSES  Parotitis can be caused by:  Viral infections. Mumps is one viral infection that can cause parotitis.  Bacterial infections.  Blockage of the salivary ducts due to a salivary stone.  Narrowing of the salivary ducts.  Swelling of the salivary ducts.  Dehydration.  Autoimmune conditions, such as sarcoidosis or Sjogren syndrome.  Air from activities such as scuba diving, glass blowing, or playing an instrument (rare).  Human immunodeficiency virus (HIV) or acquired immunodeficiency syndrome (AIDS).  Tuberculosis. SIGNS AND SYMPTOMS   The ears may appear to be pushed up and out from their normal position.  Redness (erythema) of the skin over the parotid glands.  Pain and tenderness over the parotid glands.  Swelling in the parotid gland area.  Yellowish-white fluid (pus) coming from the ducts inside the cheeks.  Dry mouth.  Bad taste in the mouth. DIAGNOSIS  Your health care provider may determine that you have parotitis based on your symptoms and a physical exam. A sample of fluid may also be taken from the parotid gland and tested to find the cause of your infection. X-rays or computed tomography (CT) scans may be taken if your health care provider thinks you might have a salivary stone blocking your salivary duct. TREATMENT  Treatment varies depending upon the cause of your parotitis. If your parotitis is caused by mumps, no treatment is needed. The condition will go away on its own after 7 to 10 days. In other cases, treatment may  include:  Antibiotic medicine if your infection was caused by bacteria.  Pain medicines.  Gland massage.  Eating sour candy to increase your saliva production.  Removal of salivary stones. Your health care provider may flush stones out with fluids or remove them with tweezers.  Surgery to remove the parotid glands. HOME CARE INSTRUCTIONS   If you were prescribed an antibiotic medicine, finish it all even if you start to feel better.  Put warm compresses on the sore area.  Take medicines only as directed by your health care provider.  Drink enough fluids to keep your urine clear or pale yellow. SEEK IMMEDIATE MEDICAL CARE IF:   You have increasing pain or swelling that is not controlled with medicine.  You have a fever. MAKE SURE YOU:  Understand these instructions.  Will watch your condition.  Will get help right away if you are not doing well or get worse.   This information is not intended to replace advice given to you by your health care provider. Make sure you discuss any questions you have with your health care provider.   Document Released: 02/07/2002 Document Revised: 09/08/2014 Document Reviewed: 01/11/2015 Elsevier Interactive Patient Education 2016 Elsevier Inc.  

## 2016-05-01 NOTE — Progress Notes (Signed)
Subjective:   Patient ID: Barbara Faulkner, female    DOB: 04/23/1966, 50 y.o.   MRN: [id number]  Barbara Faulkner is a pleasant 50 y.o. year old female who presents to clinic today with Lymphadenopathy  on 05/01/2016  HPI:  Left sided ? Swollen lymph node- noticed it a few days ago, painful.  No puss drainage.  No overlying erythema.  No fevers.   Has never had anything like this before.  Current Outpatient Prescriptions on File Prior to Visit  Medication Sig Dispense Refill  . Calcium-Magnesium-Zinc 167-83-8 MG TABS Take 1 tablet by mouth.    . Cholecalciferol (VITAMIN D) 2000 UNITS CAPS Take 3 capsules by mouth daily.    [name]. EPINEPHrine (EPIPEN 2-PAK) 0.3 mg/0.3 mL DEVI Inject as directed and then go to ER     . Iron-Folic Acid-Vit B12 (IRON FORMULA PO) Take 1 tablet by mouth daily.    . Multiple Vitamin (MULTIVITAMIN) tablet Take 1 tablet by mouth daily.    [name]. PARoxetine (PAXIL) 20 MG tablet Take 1 tablet (20 mg total) by mouth daily. 30 tablet 6  . traMADol (ULTRAM) 50 MG tablet TAKE 1 TABLET BY MOUTH EVERY 6 HOURS AS NEEDED( NEEDS OFFICE VISIT FOR FURTHER REFILLS) 60 tablet 0   No current facility-administered medications on file prior to visit.     Allergies  Allergen Reactions  . Shellfish Allergy Swelling    REACTION: throat closing  . Hydrocod Polst-Cpm Polst Er Other (See Comments)    Bad dreams    Past Medical History:  Diagnosis Date  . Allergy   . Depression   . Migraine     Past Surgical History:  Procedure Laterality Date  . CHOLECYSTECTOMY  2009  . GASTRIC BYPASS     2015  . LAPAROSCOPIC GASTRIC BANDING  2008    removed 2009  . TUBAL LIGATION  1997    Family History  Problem Relation Age of Onset  . Hypertension Mother   . Hyperlipidemia Mother   . HIV Father   . Lung cancer Maternal Grandfather     smoker  . Hepatitis Maternal Uncle     drug use  . Colon cancer Neg Hx   . Colon polyps Neg Hx   . Rectal cancer Neg Hx   . Stomach cancer Neg Hx       Social History   Social History  . Marital status: Married    Spouse name: N/A  . Number of children: 2  . Years of education: N/A   Occupational History  . [employer], [employer]   Social History Main Topics  . Smoking status: Former Smoker    Packs/day: 2.00    Years: 20.00    Types: Cigarettes    Quit date: 09/02/2003  . Smokeless tobacco: Never Used  . Alcohol use No  . Drug use: No  . Sexual activity: Not on file   Other Topics Concern  . Not on file   Social History Narrative  . No narrative on file   The PMH, PSH, Social History, Family History, Medications, and allergies have been reviewed in [facility], and have been updated if relevant.    Review of Systems  Constitutional: Negative.   HENT: Positive for sinus pressure. Negative for dental problem, trouble swallowing and voice change.   Endocrine: Negative.   Hematological: Positive for adenopathy.  All other systems reviewed and are negative.      Objective:    BP 124/60  Pulse 67   Temp 97.8 F (36.6 C) (Oral)   Wt 167 lb 12 oz (76.1 kg)   LMP 04/02/2014   SpO2 97%   BMI 28.79 kg/m    Physical Exam  Constitutional: She is oriented to person, place, and time. She appears well-developed and well-nourished. No distress.  HENT:  Head: Normocephalic.  Neck:    Cardiovascular: Normal rate.   Musculoskeletal: Normal range of motion.  Neurological: She is alert and oriented to person, place, and time. No cranial nerve deficit.  Skin: Skin is warm and dry. She is not diaphoretic.  Psychiatric: She has a normal mood and affect. Her behavior is normal. Judgment and thought content normal.  Nursing note and vitals reviewed.         Assessment & Plan:   Need for influenza vaccination - Plan: Flu Vaccine QUAD 36+ mos PF IM (Fluarix & Fluzone Quad PF)  Parotiditis No Follow-up on file.

## 2016-05-07 DIAGNOSIS — K080 Exfoliation of teeth due to systemic causes: Secondary | ICD-10-CM | POA: Diagnosis not present

## 2016-06-18 ENCOUNTER — Other Ambulatory Visit: Payer: Self-pay | Admitting: Family Medicine

## 2016-07-01 ENCOUNTER — Other Ambulatory Visit: Payer: Self-pay | Admitting: Family Medicine

## 2016-07-04 NOTE — Telephone Encounter (Signed)
Rx called in to requested pharmacy 

## 2016-07-04 NOTE — Telephone Encounter (Signed)
Last f/u 04/2016 

## 2016-08-19 ENCOUNTER — Other Ambulatory Visit: Payer: Self-pay | Admitting: Family Medicine

## 2016-08-20 NOTE — Telephone Encounter (Signed)
Last refill 07/04/16 #60, last  Ov 03/26/16. Ok to refill ?

## 2016-08-20 NOTE — Telephone Encounter (Signed)
Called in to [facility] - [place], [place] - [address]: [phone]  

## 2016-09-08 ENCOUNTER — Telehealth: Payer: Self-pay | Admitting: Family Medicine

## 2016-09-08 NOTE — Telephone Encounter (Signed)
Pt called to request records from a previous office visit with Dr. Dayton MartesAron where it is noted she spoke with pcp regarding breast reduction surgery. Pt stated Dr. Providence LaniusHowell at [facility] needs the notes from discussion for insurance. Pt signed a records release, I scanned into docs and said it could have been a couple of years ago. I searched and did not find any notes. Any thoughts? Pt is requesting a cb.

## 2016-09-08 NOTE — Telephone Encounter (Signed)
Opened in error

## 2016-09-09 NOTE — Telephone Encounter (Signed)
We way have discussed it briefly but it wasn't the purpose of an OV.  Let's have her come in to see me to discuss so I can send a good note over.

## 2016-09-09 NOTE — Telephone Encounter (Signed)
Spoke to pt, sch 1/11.

## 2016-09-11 ENCOUNTER — Encounter: Payer: Self-pay | Admitting: Family Medicine

## 2016-09-11 ENCOUNTER — Telehealth: Payer: Self-pay

## 2016-09-11 ENCOUNTER — Ambulatory Visit (INDEPENDENT_AMBULATORY_CARE_PROVIDER_SITE_OTHER): Payer: Federal, State, Local not specified - PPO | Admitting: Family Medicine

## 2016-09-11 DIAGNOSIS — M79605 Pain in left leg: Secondary | ICD-10-CM | POA: Insufficient documentation

## 2016-09-11 DIAGNOSIS — M5440 Lumbago with sciatica, unspecified side: Secondary | ICD-10-CM

## 2016-09-11 DIAGNOSIS — N62 Hypertrophy of breast: Secondary | ICD-10-CM | POA: Diagnosis not present

## 2016-09-11 DIAGNOSIS — M549 Dorsalgia, unspecified: Secondary | ICD-10-CM | POA: Insufficient documentation

## 2016-09-11 MED ORDER — CYCLOBENZAPRINE HCL 5 MG PO TABS: 5.0000 mg | ORAL_TABLET | Freq: Three times a day (TID) | ORAL | 0 refills | Status: DC | PRN

## 2016-09-11 NOTE — Patient Instructions (Signed)
Great to see you. Please use heating pad and flexeril as needed (when not driving).

## 2016-09-11 NOTE — Assessment & Plan Note (Signed)
Now leading to persistent back and shoulder discomfort despite weight loss, will refer to plastic surgery for weight loss reduction surgery.

## 2016-09-11 NOTE — Progress Notes (Signed)
SUBJECTIVE:  Durenda AgeLisa Ainley is a 51 y.o. female who complains of acute low back pain for 2 day(s), positional with bending or lifting, without radiation down the legs. Precipitating factors: recent heavy lifting and recent injury at work. Prior history of back problems:  She has had chronic low back pain which has been felt to be aggravated by her large breasts.  Despite significant weight loss after bariatric surgery, her breasts remain large.  Shoulder and back hurt intermittently.    Current Outpatient Prescriptions on File Prior to Visit  Medication Sig Dispense Refill  . Calcium-Magnesium-Zinc 167-83-8 MG TABS Take 1 tablet by mouth.    . Cholecalciferol (VITAMIN D) 2000 UNITS CAPS Take 3 capsules by mouth daily.    [name]. EPINEPHrine (EPIPEN 2-PAK) 0.3 mg/0.3 mL DEVI Inject as directed and then go to ER     . Iron-Folic Acid-Vit B12 (IRON FORMULA PO) Take 1 tablet by mouth daily.    . Multiple Vitamin (MULTIVITAMIN) tablet Take 1 tablet by mouth daily.    [name]. PARoxetine (PAXIL) 20 MG tablet TAKE 1 TABLET(20 MG) BY MOUTH DAILY 90 tablet 0  . traMADol (ULTRAM) 50 MG tablet TAKE 1 TABLET BY MOUTH EVERY 6 HOURS AS NEEDED 60 tablet 0   No current facility-administered medications on file prior to visit.     Allergies  Allergen Reactions  . Shellfish Allergy Swelling    REACTION: throat closing  . Hydrocod Polst-Cpm Polst Er Other (See Comments)    Bad dreams    Past Medical History:  Diagnosis Date  . Allergy   . Depression   . Migraine     Past Surgical History:  Procedure Laterality Date  . CHOLECYSTECTOMY  2009  . GASTRIC BYPASS     2015  . LAPAROSCOPIC GASTRIC BANDING  2008    removed 2009  . TUBAL LIGATION  1997    Family History  Problem Relation Age of Onset  . Hypertension Mother   . Hyperlipidemia Mother   . HIV Father   . Lung cancer Maternal Grandfather     smoker  . Hepatitis Maternal Uncle     drug use  . Colon cancer Neg Hx   . Colon polyps Neg Hx   . Rectal  cancer Neg Hx   . Stomach cancer Neg Hx     Social History   Social History  . Marital status: Married    Spouse name: N/A  . Number of children: 2  . Years of education: N/A   Occupational History  . [employer], [employer]   Social History Main Topics  . Smoking status: Former Smoker    Packs/day: 2.00    Years: 20.00    Types: Cigarettes    Quit date: 09/02/2003  . Smokeless tobacco: Never Used  . Alcohol use No  . Drug use: No  . Sexual activity: Not on file   Other Topics Concern  . Not on file   Social History Narrative  . No narrative on file   The PMH, PSH, Social History, Family History, Medications, and allergies have been reviewed in [facility], and have been updated if relevant.  OBJECTIVE: BP 114/76   Pulse 71   Temp 98 F (36.7 C) (Oral)   Wt 167 lb 12 oz (76.1 kg)   LMP 04/02/2014   SpO2 97%   BMI 28.79 kg/m   Patient appears to be in mild to moderate pain, antalgic gait noted. Lumbosacral spine area reveals no local tenderness  or mass.  Painful and reduced LS ROM noted. Straight leg raise is negative at bilaterally. DTR's, motor strength and sensation normal, including heel and toe gait.  Peripheral pulses are palpable. X-Ray: not indicated.

## 2016-09-11 NOTE — Progress Notes (Signed)
Pre visit review using our clinic review tool, if applicable. No additional management support is needed unless otherwise documented below in the visit note. 

## 2016-09-11 NOTE — Assessment & Plan Note (Signed)
For acute back spasm/injury- For acute pain, rest, intermittent application of heat (do not sleep on heating pad), analgesics and muscle relaxants are recommended. Discussed longer term treatment plan of prn NSAID's and discussed a home back care exercise program with flexion exercise routine. Proper lifting with avoidance of heavy lifting discussed. Consider Physical Therapy and XRay studies if not improving. Call or return to clinic prn if these symptoms worsen or fail to improve as anticipated.

## 2016-09-11 NOTE — Telephone Encounter (Signed)
Pt left v/m; pt was seen earlier today and pt contacted ins co. Pt will need documentation for breast reduction; pt will need 6 wks of treatment for back pain,shoulder pain and neck pain including antiinflammatory, support bra and wt loss. FYI to Dr Dayton MartesAron.

## 2016-10-08 ENCOUNTER — Other Ambulatory Visit: Payer: Self-pay | Admitting: Family Medicine

## 2016-10-09 ENCOUNTER — Other Ambulatory Visit: Payer: Self-pay | Admitting: Family Medicine

## 2016-10-09 NOTE — Telephone Encounter (Signed)
Last f/u 09/2016

## 2016-10-09 NOTE — Telephone Encounter (Signed)
Rx called in to requested pharmacy 

## 2016-10-16 ENCOUNTER — Other Ambulatory Visit: Payer: Self-pay | Admitting: Family Medicine

## 2016-10-16 DIAGNOSIS — N62 Hypertrophy of breast: Secondary | ICD-10-CM | POA: Diagnosis not present

## 2016-10-16 DIAGNOSIS — Z1231 Encounter for screening mammogram for malignant neoplasm of breast: Secondary | ICD-10-CM

## 2016-10-27 ENCOUNTER — Ambulatory Visit
Admission: RE | Admit: 2016-10-27 | Discharge: 2016-10-27 | Disposition: A | Discharge status: Home / Self Care | Payer: Federal, State, Local not specified - PPO | Source: Ambulatory Visit | Attending: Family Medicine | Admitting: Family Medicine

## 2016-10-27 DIAGNOSIS — Z1231 Encounter for screening mammogram for malignant neoplasm of breast: Secondary | ICD-10-CM | POA: Diagnosis not present

## 2016-11-03 ENCOUNTER — Telehealth: Payer: Self-pay

## 2016-11-03 ENCOUNTER — Ambulatory Visit: Payer: Federal, State, Local not specified - PPO

## 2016-11-03 NOTE — Telephone Encounter (Signed)
Ok to fax any relevant notes.

## 2016-11-03 NOTE — Telephone Encounter (Signed)
Pt left v/m; pt request that Dr Dayton MartesAron fax over notes to plastic surgeon, Dr Patterson HammersmithHampton Howell for breast reduction. Pt filled out forms requesting info to be faxed; pt request cb with status of fax to plastic surgeon.

## 2016-11-04 NOTE — Telephone Encounter (Signed)
Called Plastic Surgery office and got their fax# and faxed recent MMG and office note.

## 2016-11-13 ENCOUNTER — Other Ambulatory Visit: Payer: Self-pay | Admitting: Family Medicine

## 2016-11-13 NOTE — Telephone Encounter (Signed)
Tramadol last filled 10-09-16 #60  Last OV 09-11-16. Last time mood/anxiety was addressed 10-29-15.

## 2016-11-13 NOTE — Telephone Encounter (Signed)
Paroxetine Rx sent electronically. Tramadol refill on voice mail at pharmacy

## 2016-11-13 NOTE — Telephone Encounter (Signed)
Spoke to pt. Made appt 11-24-16

## 2016-11-13 NOTE — Telephone Encounter (Signed)
Ok to refill both- she does need OV for further refills.

## 2016-11-17 DIAGNOSIS — K080 Exfoliation of teeth due to systemic causes: Secondary | ICD-10-CM | POA: Diagnosis not present

## 2016-11-24 ENCOUNTER — Ambulatory Visit (INDEPENDENT_AMBULATORY_CARE_PROVIDER_SITE_OTHER): Payer: Federal, State, Local not specified - PPO | Admitting: Family Medicine

## 2016-11-24 ENCOUNTER — Encounter: Payer: Self-pay | Admitting: Family Medicine

## 2016-11-24 DIAGNOSIS — F418 Other specified anxiety disorders: Secondary | ICD-10-CM | POA: Diagnosis not present

## 2016-11-24 DIAGNOSIS — F32A Depression, unspecified: Secondary | ICD-10-CM | POA: Insufficient documentation

## 2016-11-24 DIAGNOSIS — F329 Major depressive disorder, single episode, unspecified: Secondary | ICD-10-CM | POA: Insufficient documentation

## 2016-11-24 DIAGNOSIS — F419 Anxiety disorder, unspecified: Principal | ICD-10-CM

## 2016-11-24 MED ORDER — PAROXETINE HCL 20 MG PO TABS: 20.0000 mg | ORAL_TABLET | Freq: Every day | ORAL | 11 refills | Status: DC

## 2016-11-24 NOTE — Progress Notes (Signed)
Subjective:   Patient ID: Durenda AgeLisa Pikus, female    DOB: 08/30/1966, 51 y.o.   MRN: [id number]  Durenda AgeLisa Rosato is a pleasant 51 y.o. year old female who presents to clinic today with Follow-up  on 11/24/2016  HPI:  Anxiety/depression- has been well controlled on Paxil 20 mg daily for years.  Sleeping well.  Denies any symptoms of anxiety or depression.  She would like to continue taking it.  Current Outpatient Prescriptions on File Prior to Visit  Medication Sig Dispense Refill  . Calcium-Magnesium-Zinc 167-83-8 MG TABS Take 1 tablet by mouth.    . Cholecalciferol (VITAMIN D) 2000 UNITS CAPS Take 3 capsules by mouth daily.    . cyclobenzaprine (FLEXERIL) 5 MG tablet Take 1 tablet (5 mg total) by mouth 3 (three) times daily as needed for muscle spasms. 30 tablet 0  . EPINEPHrine (EPIPEN 2-PAK) 0.3 mg/0.3 mL DEVI Inject as directed and then go to ER     . Iron-Folic Acid-Vit B12 (IRON FORMULA PO) Take 1 tablet by mouth daily.    . Multiple Vitamin (MULTIVITAMIN) tablet Take 1 tablet by mouth daily.    . traMADol (ULTRAM) 50 MG tablet TAKE 1 TABLET BY MOUTH EVERY 6 HOURS AS NEEDED 60 tablet 0   No current facility-administered medications on file prior to visit.     Allergies  Allergen Reactions  . Shellfish Allergy Swelling    REACTION: throat closing  . Hydrocod Polst-Cpm Polst Er Other (See Comments)    Bad dreams    Past Medical History:  Diagnosis Date  . Allergy   . Depression   . Migraine     Past Surgical History:  Procedure Laterality Date  . CHOLECYSTECTOMY  2009  . GASTRIC BYPASS     2015  . LAPAROSCOPIC GASTRIC BANDING  2008    removed 2009  . TUBAL LIGATION  1997    Family History  Problem Relation Age of Onset  . HIV Father   . Hypertension Mother   . Hyperlipidemia Mother   . Lung cancer Maternal Grandfather     smoker  . Hepatitis Maternal Uncle     drug use  . Colon cancer Neg Hx   . Colon polyps Neg Hx   . Rectal cancer Neg Hx   . Stomach  cancer Neg Hx     Social History   Social History  . Marital status: Married    Spouse name: N/A  . Number of children: 2  . Years of education: N/A   Occupational History  . [employer], [employer]   Social History Main Topics  . Smoking status: Former Smoker    Packs/day: 2.00    Years: 20.00    Types: Cigarettes    Quit date: 09/02/2003  . Smokeless tobacco: Never Used  . Alcohol use No  . Drug use: No  . Sexual activity: Not on file   Other Topics Concern  . Not on file   Social History Narrative  . No narrative on file   The PMH, PSH, Social History, Family History, Medications, and allergies have been reviewed in [facility], and have been updated if relevant.   Review of Systems  Psychiatric/Behavioral: Negative for agitation, behavioral problems, confusion, decreased concentration, dysphoric mood, hallucinations, self-injury, sleep disturbance and suicidal ideas. The patient is not nervous/anxious and is not hyperactive.   All other systems reviewed and are negative.      Objective:    BP 122/82   Pulse 72  Temp 98.6 F (37 C)   Ht 5\' 3"  (1.6 m)   Wt 170 lb (77.1 kg)   LMP 05/04/2014   SpO2 98%   BMI 30.11 kg/m    Physical Exam  Constitutional: She is oriented to person, place, and time. She appears well-developed and well-nourished. No distress.  HENT:  Head: Normocephalic and atraumatic.  Eyes: Conjunctivae are normal.  Cardiovascular: Normal rate.   Pulmonary/Chest: Effort normal.  Musculoskeletal: Normal range of motion.  Neurological: She is alert and oriented to person, place, and time. No cranial nerve deficit.  Skin: Skin is warm and dry. She is not diaphoretic.  Psychiatric: She has a normal mood and affect. Her behavior is normal. Judgment and thought content normal.  Nursing note and vitals reviewed.         Assessment & Plan:   Anxiety and depression No Follow-up on file.

## 2016-11-24 NOTE — Assessment & Plan Note (Signed)
Stable on current dose of Paxil. eRx refills sent. The patient indicates understanding of these issues and agrees with the plan.

## 2017-01-30 ENCOUNTER — Other Ambulatory Visit: Payer: Self-pay | Admitting: Family Medicine

## 2017-01-30 NOTE — Telephone Encounter (Signed)
Last refill 11/13/16 #60, last OV 11/24/16

## 2017-01-30 NOTE — Telephone Encounter (Signed)
Called in to [facility] - [place], [place] - [address]: [phone]  

## 2017-02-02 DIAGNOSIS — K080 Exfoliation of teeth due to systemic causes: Secondary | ICD-10-CM | POA: Diagnosis not present

## 2017-03-26 ENCOUNTER — Other Ambulatory Visit: Payer: Self-pay | Admitting: Family Medicine

## 2017-03-26 NOTE — Telephone Encounter (Signed)
Faxed to  [facility] - [place], [place] - [address]: [phone]   

## 2017-03-26 NOTE — Telephone Encounter (Signed)
Last refill 01/30/17  Last OV 11/24/16  Ok to refill?

## 2017-04-02 ENCOUNTER — Ambulatory Visit (INDEPENDENT_AMBULATORY_CARE_PROVIDER_SITE_OTHER): Payer: Federal, State, Local not specified - PPO | Admitting: Family Medicine

## 2017-04-02 ENCOUNTER — Encounter: Payer: Self-pay | Admitting: Family Medicine

## 2017-04-02 ENCOUNTER — Encounter: Payer: Self-pay | Admitting: *Deleted

## 2017-04-02 VITALS — BP 110/78 | HR 71 | Temp 98.2°F | Ht 64.0 in | Wt 158.2 lb

## 2017-04-02 DIAGNOSIS — M25551 Pain in right hip: Secondary | ICD-10-CM | POA: Diagnosis not present

## 2017-04-02 DIAGNOSIS — M1611 Unilateral primary osteoarthritis, right hip: Secondary | ICD-10-CM

## 2017-04-02 MED ORDER — METHYLPREDNISOLONE ACETATE 40 MG/ML IJ SUSP
80.0000 mg | Freq: Once | INTRAMUSCULAR | Status: AC
  Administered 2017-04-02: 80 mg via INTRA_ARTICULAR

## 2017-04-02 NOTE — Progress Notes (Signed)
Dr. Karleen HampshireSpencer T. Uriyah Raska, MD, CAQ Sports [facility] [address] [place], [address] Phone: [phone] Fax: [phone]  04/02/2017  Patient: Barbara Faulkner, MRN: [id number], DOB: 11/04/1965, 51 y.o.  Primary Physician:  Dianne DunAron, Talia M, MD   Chief Complaint  Patient presents with  . Hip Pain    Wants injection   Subjective:   Barbara Faulkner is a 51 y.o. very pleasant female patient who presents with the following:  R OA: I saw the patient 2 years ago, and at that point we did a workup on her that included an MR arthrogram 1 had concern for labral pathology versus stress fracture, and this ended up being negative but shows some degenerative changes in the hip. We did a diagnostic and therapeutic ultrasound-guided hip injection, and the patient had relief of symptoms for 2 years.  US guided hip inj  Past Medical History, Surgical History, Social History, Family History, Problem List, Medications, and Allergies have been reviewed and updated if relevant.  Patient Active Problem List   Diagnosis Date Noted  . Anxiety and depression 11/24/2016  . Back pain 09/11/2016  . Large breasts 09/11/2016  . Parotiditis 05/01/2016  . Abnormal laboratory test result 03/26/2016  . Proteinuria 03/26/2016  . Arthralgia of toe of right foot 10/29/2015  . Right hip pain 04/18/2015  . S/P bariatric surgery 09/25/2014  . Sinusitis, acute 01/08/2014  . Psoriatic arthritis (HCC) 09/13/2013  . DUB (dysfunctional uterine bleeding) 10/27/2012  . 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 Lyme disease 06/22/2012  . 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 SHELLFISH ALLERGY 09/27/2010  . THYROMEGALY 09/03/2010  . ALLERGIC RHINITIS 03/15/2010  . MORBID OBESITY 05/17/2008  . MIGRAINE W/AURA W/O INTRACT W/O STATUS MIGRNOSUS 05/17/2008    Past Medical History:  Diagnosis Date  . Allergy   . Depression   . Migraine     Past Surgical History:   Procedure Laterality Date  . CHOLECYSTECTOMY  2009  . GASTRIC BYPASS     2015  . LAPAROSCOPIC GASTRIC BANDING  2008    removed 2009  . TUBAL LIGATION  1997    Social History   Social History  . Marital status: Married    Spouse name: N/A  . Number of children: 2  . Years of education: N/A   Occupational History  . [employer], [employer]   Social History Main Topics  . Smoking status: Former Smoker    Packs/day: 2.00    Years: 20.00    Types: Cigarettes    Quit date: 09/02/2003  . Smokeless tobacco: Never Used  . Alcohol use No  . Drug use: No  . Sexual activity: Not on file   Other Topics Concern  . Not on file   Social History Narrative  . No narrative on file    Family History  Problem Relation Age of Onset  . HIV Father   . Hypertension Mother   . Hyperlipidemia Mother   . Lung cancer Maternal Grandfather        smoker  . Hepatitis Maternal Uncle        drug use  . Colon cancer Neg Hx   . Colon polyps Neg Hx   . Rectal cancer Neg Hx   . Stomach cancer Neg Hx     Allergies  Allergen Reactions  . Shellfish Allergy Swelling    REACTION: throat closing  . Hydrocod Polst-Cpm Polst Er Other (See  Comments)    Bad dreams    Medication list reviewed and updated in full in  Link.  GEN: No fevers, chills. Nontoxic. Primarily MSK c/o today. MSK: Detailed in the HPI GI: tolerating PO intake without difficulty Neuro: No numbness, parasthesias, or tingling associated. Otherwise the pertinent positives of the ROS are noted above.   Objective:   BP 110/78   Pulse 71   Temp 98.2 F (36.8 C) (Oral)   Ht 5\' 4"  (1.626 m)   Wt 158 lb 4 oz (71.8 kg)   LMP 05/04/2014   BMI 27.16 kg/m    GEN: WDWN, NAD, Non-toxic, Alert & Oriented x 3 HEENT: Atraumatic, Normocephalic.  Ears and Nose: No external deformity. EXTR: No clubbing/cyanosis/edema NEURO: Normal gait.  PSYCH: Normally interactive. Conversant. Not depressed or  anxious appearing.  Calm demeanor.   HIP EXAM: SIDE: R ROM: Abduction, Flexion, Internal and External range of motion: minor loss of motion Pain with terminal IROM and EROM: with yes, mild GTB: NT SLR: NEG Knees: No effusion FABER: NT REVERSE FABER: NT, neg Piriformis: NT at direct palpation Str: flexion: 5/5 abduction: 5/5 adduction: 5/5 Strength testing non-tender  Radiology: No results found.  Assessment and Plan:   Primary osteoarthritis of right hip  Right hip pain - Plan: methylPREDNISolone acetate (DEPO-MEDROL) injection 80 mg  Continue to work on strengthening and weight loss. She has lost 140 pounds overall.  Ultrasound guided hip injection. RIGHT Verbal and written consent was obtained. Potential complications including infection, bleeding, anesthesia to nerve were all discussed with the patient, and she would like to proceed. Using Terason ultrasound machine, the intra-articular space was located. The patient was then prepped with Chloraprep x 2. A 22-gauge spinal needle was visualized going to the joint capsule, and fluid was observed entering the joint capsule. The combination of 8 cc of lidocaine 1% and 2 mL of Depo-Medrol 40 mg, equaling Depo-Medrol 80 mg was injected into the intra-articular hip space. The patient tolerated the procedure well and had almost immediate improvement of his pain symptoms. No complications. Procedure was tolerated well.   Follow-up: No Follow-up on file.  Meds ordered this encounter  Medications  . methylPREDNISolone acetate (DEPO-MEDROL) injection 80 mg   Signed,  Jeoffrey Eleazer T. Carmin Dibartolo, MD   Allergies as of 04/02/2017      Reactions   Shellfish Allergy Swelling   REACTION: throat closing   Hydrocod Polst-cpm Polst Er Other (See Comments)   Bad dreams      Medication List       Accurate as of 04/02/17  1:42 PM. Always use your most recent med list.          Calcium-Magnesium-Zinc 167-83-8 MG Tabs Take 1 tablet by mouth.     cyclobenzaprine 5 MG tablet Commonly known as:  FLEXERIL Take 1 tablet (5 mg total) by mouth 3 (three) times daily as needed for muscle spasms.   EPIPEN 2-PAK 0.3 mg/0.3 mL Devi Generic drug:  EPINEPHrine Inject as directed and then go to ER   IRON FORMULA PO Take 1 tablet by mouth daily.   multivitamin tablet Take 1 tablet by mouth daily.   PARoxetine 20 MG tablet Commonly known as:  PAXIL Take 1 tablet (20 mg total) by mouth daily.   traMADol 50 MG tablet Commonly known as:  ULTRAM TAKE 1 TABLET BY MOUTH EVERY 6 HOURS AS NEEDED   Vitamin D 2000 units Caps Take 3 capsules by mouth daily.

## 2017-05-01 ENCOUNTER — Encounter: Payer: Self-pay | Admitting: Primary Care

## 2017-05-01 ENCOUNTER — Ambulatory Visit (INDEPENDENT_AMBULATORY_CARE_PROVIDER_SITE_OTHER): Payer: Federal, State, Local not specified - PPO | Admitting: Primary Care

## 2017-05-01 VITALS — BP 120/76 | HR 78 | Temp 98.0°F | Wt 160.4 lb

## 2017-05-01 DIAGNOSIS — K649 Unspecified hemorrhoids: Secondary | ICD-10-CM | POA: Diagnosis not present

## 2017-05-01 MED ORDER — HYDROCORTISONE ACETATE 25 MG RE SUPP: 25.0000 mg | Freq: Two times a day (BID) | RECTAL | 0 refills | Status: DC

## 2017-05-01 NOTE — Assessment & Plan Note (Signed)
Exam today with evidence of internal and external hemorrhoids. Non thrombosed, no bleeding. [facility] provided. She will update if no improvement.

## 2017-05-01 NOTE — Progress Notes (Signed)
Subjective:    Patient ID: Barbara Faulkner, female    DOB: 01/09/1966, 51 y.o.   MRN: [id number]  HPI  Barbara Faulkner is a 51 year old female with a history of hemorrhoids who presents today with a chief complaint of hemorrhoids. She also reports recent constipation with bleeding and painful hemorrhoids that began 6 days ago. She's been using Preparation H since with some reduction in size, but bleeding and pain persists. She only notices bright red blood on the toilet paper when she wipes. She denies fatigue. Her constipation has resolved.    Review of Systems  Constitutional: Negative for fatigue and fever.  Gastrointestinal: Negative for abdominal pain and constipation.       Hemorrhoids   Neurological: Negative for weakness.       Past Medical History:  Diagnosis Date  . Allergy   . Depression   . Migraine      Social History   Social History  . Marital status: Married    Spouse name: N/A  . Number of children: 2  . Years of education: N/A   Occupational History  . [employer], [employer]   Social History Main Topics  . Smoking status: Former Smoker    Packs/day: 2.00    Years: 20.00    Types: Cigarettes    Quit date: 09/02/2003  . Smokeless tobacco: Never Used  . Alcohol use No  . Drug use: No  . Sexual activity: Not on file   Other Topics Concern  . Not on file   Social History Narrative  . No narrative on file    Past Surgical History:  Procedure Laterality Date  . CHOLECYSTECTOMY  2009  . GASTRIC BYPASS     2015  . LAPAROSCOPIC GASTRIC BANDING  2008    removed 2009  . TUBAL LIGATION  1997    Family History  Problem Relation Age of Onset  . HIV Father   . Hypertension Mother   . Hyperlipidemia Mother   . Lung cancer Maternal Grandfather        smoker  . Hepatitis Maternal Uncle        drug use  . Colon cancer Neg Hx   . Colon polyps Neg Hx   . Rectal cancer Neg Hx   . Stomach cancer Neg Hx     Allergies  Allergen  Reactions  . Shellfish Allergy Swelling    REACTION: throat closing  . Hydrocod Polst-Cpm Polst Er Other (See Comments)    Bad dreams    Current Outpatient Prescriptions on File Prior to Visit  Medication Sig Dispense Refill  . Calcium-Magnesium-Zinc 167-83-8 MG TABS Take 1 tablet by mouth.    . Cholecalciferol (VITAMIN D) 2000 UNITS CAPS Take 3 capsules by mouth daily.    . Iron-Folic Acid-Vit B12 (IRON FORMULA PO) Take 1 tablet by mouth daily.    . Multiple Vitamin (MULTIVITAMIN) tablet Take 1 tablet by mouth daily.    [name]. PARoxetine (PAXIL) 20 MG tablet Take 1 tablet (20 mg total) by mouth daily. 30 tablet 11  . traMADol (ULTRAM) 50 MG tablet TAKE 1 TABLET BY MOUTH EVERY 6 HOURS AS NEEDED 60 tablet 2  . EPINEPHrine (EPIPEN 2-PAK) 0.3 mg/0.3 mL DEVI Inject as directed and then go to ER      No current facility-administered medications on file prior to visit.     BP 120/76   Pulse 78   Temp 98 F (36.7 C) (Oral)   Wt  160 lb 6.4 oz (72.8 kg)   LMP 05/04/2014   SpO2 98%   BMI 27.53 kg/m    Objective:   Physical Exam  Constitutional: She appears well-nourished.  Cardiovascular: Normal rate and regular rhythm.   Pulmonary/Chest: Effort normal and breath sounds normal.  Genitourinary: Rectal exam shows external hemorrhoid and internal hemorrhoid.  Genitourinary Comments: 1 cm non thrombosed hemorrhoid noted to left anus. No bleeding. Several smaller hemorrhoids noted around anus.   Skin: Skin is warm and dry.          Assessment & Plan:

## 2017-05-01 NOTE — Patient Instructions (Signed)
Insert one suppository twice daily for 6 days.   Ensure you are eating enough fiber and drinking enough water to prevent constipation.   It was a pleasure meeting you!   High-Fiber Diet Fiber, also called dietary fiber, is a type of carbohydrate found in fruits, vegetables, whole grains, and beans. A high-fiber diet can have many health benefits. Your health care provider may recommend a high-fiber diet to help:  Prevent constipation. Fiber can make your bowel movements more regular.  Lower your cholesterol.  Relieve hemorrhoids, uncomplicated diverticulosis, or irritable bowel syndrome.  Prevent overeating as part of a weight-loss plan.  Prevent heart disease, type 2 diabetes, and certain cancers.  What is my plan? The recommended daily intake of fiber includes:  38 grams for men under age 42.  30 grams for men over age 32.  25 grams for women under age 35.  21 grams for women over age 44.  You can get the recommended daily intake of dietary fiber by eating a variety of fruits, vegetables, grains, and beans. Your health care provider may also recommend a fiber supplement if it is not possible to get enough fiber through your diet. What do I need to know about a high-fiber diet?  Fiber supplements have not been widely studied for their effectiveness, so it is better to get fiber through food sources.  Always check the fiber content on thenutrition facts label of any prepackaged food. Look for foods that contain at least 5 grams of fiber per serving.  Ask your dietitian if you have questions about specific foods that are related to your condition, especially if those foods are not listed in the following section.  Increase your daily fiber consumption gradually. Increasing your intake of dietary fiber too quickly may cause bloating, cramping, or gas.  Drink plenty of water. Water helps you to digest fiber. What foods can I eat? Grains Whole-grain breads. Multigrain  cereal. Oats and oatmeal. Brown rice. Barley. Bulgur wheat. Millet. Bran muffins. Popcorn. Rye wafer crackers. Vegetables Sweet potatoes. Spinach. Kale. Artichokes. Cabbage. Broccoli. Green peas. Carrots. Squash. Fruits Berries. Pears. Apples. Oranges. Avocados. Prunes and raisins. Dried figs. Meats and Other Protein Sources Navy, kidney, pinto, and soy beans. Split peas. Lentils. Nuts and seeds. Dairy Fiber-fortified yogurt. Beverages Fiber-fortified soy milk. Fiber-fortified orange juice. Other Fiber bars. The items listed above may not be a complete list of recommended foods or beverages. Contact your dietitian for more options. What foods are not recommended? Grains White bread. Pasta made with refined flour. White rice. Vegetables Fried potatoes. Canned vegetables. Well-cooked vegetables. Fruits Fruit juice. Cooked, strained fruit. Meats and Other Protein Sources Fatty cuts of meat. Fried Environmental education officer or fried fish. Dairy Milk. Yogurt. Cream cheese. Sour cream. Beverages Soft drinks. Other Cakes and pastries. Butter and oils. The items listed above may not be a complete list of foods and beverages to avoid. Contact your dietitian for more information. What are some tips for including high-fiber foods in my diet?  Eat a wide variety of high-fiber foods.  Make sure that half of all grains consumed each day are whole grains.  Replace breads and cereals made from refined flour or white flour with whole-grain breads and cereals.  Replace white rice with brown rice, bulgur wheat, or millet.  Start the day with a breakfast that is high in fiber, such as a cereal that contains at least 5 grams of fiber per serving.  Use beans in place of meat in soups, salads, or pasta.  Eat high-fiber snacks, such as berries, raw vegetables, nuts, or popcorn. This information is not intended to replace advice given to you by your health care provider. Make sure you discuss any questions you have  with your health care provider. Document Released: 08/18/2005 Document Revised: 01/24/2016 Document Reviewed: 01/31/2014 Elsevier Interactive Patient Education  2017 [employer]

## 2017-05-19 ENCOUNTER — Telehealth: Payer: Self-pay

## 2017-05-19 NOTE — Telephone Encounter (Signed)
Started PA on [facility] under office login. Said we should receive further info in the next 72 hours

## 2017-05-25 DIAGNOSIS — K080 Exfoliation of teeth due to systemic causes: Secondary | ICD-10-CM | POA: Diagnosis not present

## 2017-06-02 ENCOUNTER — Ambulatory Visit (INDEPENDENT_AMBULATORY_CARE_PROVIDER_SITE_OTHER): Payer: Federal, State, Local not specified - PPO | Admitting: Family Medicine

## 2017-06-02 ENCOUNTER — Encounter: Payer: Self-pay | Admitting: Family Medicine

## 2017-06-02 VITALS — BP 128/84 | HR 81 | Temp 98.4°F | Wt 161.0 lb

## 2017-06-02 DIAGNOSIS — J0111 Acute recurrent frontal sinusitis: Secondary | ICD-10-CM | POA: Diagnosis not present

## 2017-06-02 MED ORDER — AMOXICILLIN-POT CLAVULANATE 875-125 MG PO TABS: 1.0000 | ORAL_TABLET | Freq: Two times a day (BID) | ORAL | 0 refills | Status: AC

## 2017-06-02 NOTE — Progress Notes (Signed)
SUBJECTIVE:  Barbara Faulkner is a 51 y.o. female who complains of coryza, congestion, dry cough and bilateral sinus pain for 8 days. She denies a history of anorexia and chest pain and denies a history of asthma. Patient denies smoke cigarettes.   Current Outpatient Prescriptions on File Prior to Visit  Medication Sig Dispense Refill  . EPINEPHrine (EPIPEN 2-PAK) 0.3 mg/0.3 mL DEVI Inject as directed and then go to ER     . Multiple Vitamin (MULTIVITAMIN) tablet Take 1 tablet by mouth daily.    [name] PARoxetine (PAXIL) 20 MG tablet Take 1 tablet (20 mg total) by mouth daily. 30 tablet 11  . traMADol (ULTRAM) 50 MG tablet TAKE 1 TABLET BY MOUTH EVERY 6 HOURS AS NEEDED 60 tablet 2  . Calcium-Magnesium-Zinc 167-83-8 MG TABS Take 1 tablet by mouth.    . Cholecalciferol (VITAMIN D) 2000 UNITS CAPS Take 3 capsules by mouth daily.    . Iron-Folic Acid-Vit B12 (IRON FORMULA PO) Take 1 tablet by mouth daily.     No current facility-administered medications on file prior to visit.     Allergies  Allergen Reactions  . Shellfish Allergy Swelling    REACTION: throat closing  . Hydrocod Polst-Cpm Polst Er Other (See Comments)    Bad dreams    Past Medical History:  Diagnosis Date  . Allergy   . Depression   . Migraine     Past Surgical History:  Procedure Laterality Date  . CHOLECYSTECTOMY  2009  . GASTRIC BYPASS     2015  . LAPAROSCOPIC GASTRIC BANDING  2008    removed 2009  . TUBAL LIGATION  1997    Family History  Problem Relation Age of Onset  . HIV Father   . Hypertension Mother   . Hyperlipidemia Mother   . Lung cancer Maternal Grandfather        smoker  . Hepatitis Maternal Uncle        drug use  . Colon cancer Neg Hx   . Colon polyps Neg Hx   . Rectal cancer Neg Hx   . Stomach cancer Neg Hx     Social History   Social History  . Marital status: Married    Spouse name: N/A  . Number of children: 2  . Years of education: N/A   Occupational History  . [employer],  [employer]   Social History Main Topics  . Smoking status: Former Smoker    Packs/day: 2.00    Years: 20.00    Types: Cigarettes    Quit date: 09/02/2003  . Smokeless tobacco: Never Used  . Alcohol use No  . Drug use: No  . Sexual activity: Not on file   Other Topics Concern  . Not on file   Social History Narrative  . No narrative on file   The PMH, PSH, Social History, Family History, Medications, and allergies have been reviewed in [facility], and have been updated if relevant.    OBJECTIVE:  BP 128/84 (BP Location: Left Arm, Patient Position: Sitting, Cuff Size: Normal)   Pulse 81   Temp 98.4 F (36.9 C) (Oral)   Wt 161 lb (73 kg)   LMP 05/04/2014   SpO2 97%   BMI 27.64 kg/m   She appears well, vital signs are as noted. Ears normal.  Throat and pharynx normal.  Neck supple. No adenopathy in the neck. Nose is congested. Sinuses tender. The chest is clear, without wheezes or rales.  ASSESSMENT:  sinusitis  PLAN: Given duration and progression of symptoms, will treat for bacterial sinusitis with Augmentin.   Symptomatic therapy suggested: push fluids, rest and return office visit prn if symptoms persist or worsen.   Call or return to clinic prn if these symptoms worsen or fail to improve as anticipated.

## 2017-06-02 NOTE — Patient Instructions (Signed)
Happy Birthday!

## 2017-08-22 ENCOUNTER — Other Ambulatory Visit: Payer: Self-pay | Admitting: Family Medicine

## 2017-08-26 NOTE — Telephone Encounter (Signed)
Ok to refill? Last filled 03/26/17 Last OV 06/10/17

## 2017-08-31 ENCOUNTER — Telehealth: Payer: Self-pay | Admitting: Family Medicine

## 2017-08-31 NOTE — Telephone Encounter (Signed)
Copied from CRM [phone]. Topic: Quick Communication - See Telephone Encounter >> Aug 31, 2017  3:03 PM Diana EvesHoyt, Maryann B wrote: CRM for notification. See Telephone encounter for:  Pt is needing refill on tramadol. Pt has [facility] up [facility] Could pt get one more refill until that appt  08/31/17.

## 2017-09-02 ENCOUNTER — Other Ambulatory Visit: Payer: Self-pay

## 2017-09-02 MED ORDER — TRAMADOL HCL 50 MG PO TABS: 50.0000 mg | ORAL_TABLET | Freq: Four times a day (QID) | ORAL | 0 refills | Status: DC | PRN

## 2017-09-02 NOTE — Telephone Encounter (Signed)
Printed 30d and will fax when TA signs/[facility]/thx dmf

## 2017-09-02 NOTE — Progress Notes (Signed)
Printed/will fax when TA signs/thx dmf

## 2017-09-02 NOTE — Telephone Encounter (Signed)
Pt following up on request for her tramadol.  [facility] [phone] [place] - [address] [phone] (Phone) [phone] (Fax)

## 2017-09-03 NOTE — Telephone Encounter (Signed)
Rx [facility]/thx dmf

## 2017-09-18 ENCOUNTER — Encounter: Payer: Self-pay | Admitting: Radiology

## 2017-09-18 ENCOUNTER — Encounter: Payer: Self-pay | Admitting: Primary Care

## 2017-09-18 ENCOUNTER — Ambulatory Visit: Payer: Federal, State, Local not specified - PPO | Admitting: Primary Care

## 2017-09-18 VITALS — BP 112/72 | HR 79 | Temp 98.3°F | Ht 64.0 in | Wt 167.8 lb

## 2017-09-18 DIAGNOSIS — G894 Chronic pain syndrome: Secondary | ICD-10-CM | POA: Diagnosis not present

## 2017-09-18 DIAGNOSIS — T7802XD Anaphylactic reaction due to shellfish (crustaceans), subsequent encounter: Secondary | ICD-10-CM

## 2017-09-18 DIAGNOSIS — F329 Major depressive disorder, single episode, unspecified: Secondary | ICD-10-CM | POA: Diagnosis not present

## 2017-09-18 DIAGNOSIS — F419 Anxiety disorder, unspecified: Secondary | ICD-10-CM | POA: Diagnosis not present

## 2017-09-18 DIAGNOSIS — J3489 Other specified disorders of nose and nasal sinuses: Secondary | ICD-10-CM | POA: Diagnosis not present

## 2017-09-18 DIAGNOSIS — F32A Depression, unspecified: Secondary | ICD-10-CM

## 2017-09-18 DIAGNOSIS — L4050 Arthropathic psoriasis, unspecified: Secondary | ICD-10-CM | POA: Diagnosis not present

## 2017-09-18 MED ORDER — PREDNISONE 20 MG PO TABS: ORAL_TABLET | ORAL | 0 refills | Status: DC

## 2017-09-18 MED ORDER — TRAMADOL HCL 50 MG PO TABS: ORAL_TABLET | ORAL | 0 refills | Status: DC

## 2017-09-18 NOTE — Progress Notes (Signed)
Subjective:    Patient ID: Barbara Faulkner, female    DOB: Jan 08, 1966, 52 y.o.   MRN: [id number]  HPI  Ms. Usery is a 52 year old female who presents today to transfer care from Dr. Dayton Martes.  1) Anxiety and Depression: Diagnosed in 2005. Currently managed on paroxetine 20 mg. Overall feels well managed on Paxil. Denies SI/HI.  2) Psoriatic Arthritis: Previously following with Rheumatology and was once on methotrexate for which she could not tolerate. Most of her pain is located to the fingers, hips, and lower extremities. She does have a history of Lyme's Disease which increased chronic joint pain. She is currently managed on Tramadol 50 mg. She will mostly take one tablet once daily before work. Her last refill of Tramadol 50 mg was on 09/02/17.   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 Her throat will close up and will break out in hives. She avoids all shell fish and has not had to use her Epi Pen. She has never had to use her Epi Pen.  4) Sinus Pressure: Experiences 2 sinus infections annually. Recent symptoms include sinus pressure, frontal headache, "feels like my head is under water". Symptoms began 5 days ago. She's doing sinus rinses and has not taken anything OTC. She denies fevers, expulsion of thick mucous from her nasal cavity.  Review of Systems  Constitutional: Negative for chills, fatigue and fever.  HENT: Positive for congestion, sinus pressure and sinus pain. Negative for sore throat.   Respiratory: Negative for cough.   Cardiovascular: Negative for chest pain.  Musculoskeletal:       Chronic arthralgias  Allergic/Immunologic: Positive for food allergies.  Psychiatric/Behavioral:       See HPI       Past Medical History:  Diagnosis Date  . Allergy   . Chronic pain   . Depression   . Migraine   . Psoriatic arthritis (HCC)      Social History   Socioeconomic History  . Marital status: Married    Spouse name: Not on file  . Number of children: 2  . Years  of education: Not on file  . Highest education level: Not on file  Social Needs  . Financial resource strain: Not on file  . Food insecurity - worry: Not on file  . Food insecurity - inability: Not on file  . Transportation needs - medical: Not on file  . Transportation needs - non-medical: Not on file  Occupational History  . Occupation: Forensic scientist, Publishing rights manager: [employer]  Tobacco Use  . Smoking status: Former Smoker    Packs/day: 2.00    Years: 20.00    Pack years: 40.00    Types: Cigarettes    Last attempt to quit: 09/02/2003    Years since quitting: 14.0  . Smokeless tobacco: Never Used  Substance and Sexual Activity  . Alcohol use: No    Alcohol/week: 0.0 oz  . Drug use: No  . Sexual activity: Not on file  Other Topics Concern  . Not on file  Social History Narrative   Married.   1 child, 5 grandchildren.   Works for the [employer].   Enjoys reading and exercising.     Past Surgical History:  Procedure Laterality Date  . CHOLECYSTECTOMY  2009  . GASTRIC BYPASS     2015  . LAPAROSCOPIC GASTRIC BANDING  2008    removed 2009  . TUBAL LIGATION  1997    Family History  Problem Relation Age of Onset  . HIV Father   . Hypertension Mother   . Hyperlipidemia Mother   . Lung cancer Maternal Grandfather        smoker  . Hepatitis Maternal Uncle        drug use  . Colon cancer Neg Hx   . Colon polyps Neg Hx   . Rectal cancer Neg Hx   . Stomach cancer Neg Hx     Allergies  Allergen Reactions  . Shellfish Allergy Swelling    REACTION: throat closing  . Hydrocod Polst-Cpm Polst Er Other (See Comments)    Bad dreams    Current Outpatient Medications on File Prior to Visit  Medication Sig Dispense Refill  . Calcium-Magnesium-Zinc 167-83-8 MG TABS Take 1 tablet by mouth.    . Cholecalciferol (VITAMIN D) 2000 UNITS CAPS Take 3 capsules by mouth daily.    [name]. EPINEPHrine (EPIPEN 2-PAK) 0.3 mg/0.3 mL DEVI Inject as directed and then go to ER       . Iron-Folic Acid-Vit B12 (IRON FORMULA PO) Take 1 tablet by mouth daily.    . Multiple Vitamin (MULTIVITAMIN) tablet Take 1 tablet by mouth daily.    [name]. PARoxetine (PAXIL) 20 MG tablet Take 1 tablet (20 mg total) by mouth daily. 30 tablet 11  . traMADol (ULTRAM) 50 MG tablet Take 1 tablet (50 mg total) by mouth every 6 (six) hours as needed. 60 tablet 0   No current facility-administered medications on file prior to visit.     BP 112/72   Pulse 79   Temp 98.3 F (36.8 C) (Oral)   Ht 5\' 4"  (1.626 m)   Wt 167 lb 12.8 oz (76.1 kg)   LMP 05/04/2014   SpO2 98%   BMI 28.80 kg/m    Objective:   Physical Exam  Constitutional: She appears well-nourished. She does not appear ill.  HENT:  Right Ear: Tympanic membrane and ear canal normal.  Left Ear: Tympanic membrane and ear canal normal.  Nose: Mucosal edema present. Right sinus exhibits maxillary sinus tenderness. Right sinus exhibits no frontal sinus tenderness. Left sinus exhibits frontal sinus tenderness. Left sinus exhibits no maxillary sinus tenderness.  Mouth/Throat: Oropharynx is clear and moist.  Eyes: Conjunctivae are normal.  Neck: Neck supple.  Cardiovascular: Normal rate and regular rhythm.  Pulmonary/Chest: Effort normal and breath sounds normal. She has no wheezes. She has no rales.  Lymphadenopathy:    She has no cervical adenopathy.  Skin: Skin is warm and dry.  Psychiatric: She has a normal mood and affect.          Assessment & Plan:  Acute Sinusitis:  Symptoms x 5 days. Doesn't appear bacterial at this point, likely viral. Continue sinus rinses. Rx for prednisone course sent to pharmacy. Will start Flonase after Prednisone. Return precautions provided.  Morrie Sheldonlark,Coda Mathey Kendal, NP

## 2017-09-18 NOTE — Assessment & Plan Note (Signed)
Avoids. No use of EpiPen. Continue to carry EpiPen at all times.

## 2017-09-18 NOTE — Assessment & Plan Note (Signed)
Doing well on Paxil, continue same. Denies SI/HI.

## 2017-09-18 NOTE — Patient Instructions (Addendum)
Stop by the lab for the urine drug screen and controlled substance contract.  Start prednisone 20 mg tablets for sinus pressure. Take 2 tablets daily for 5 days.   Start using Flonase (fluticasone) nasal spray after you complete the prednisone. Instill 1 spray in each nostril twice daily.   Please schedule a physical with me in 2019. You may also schedule a lab only appointment 3-4 days prior. We will discuss your lab results in detail during your physical.  It was a pleasure meeting you!

## 2017-09-18 NOTE — Assessment & Plan Note (Signed)
Managed on Tramadol once daily per prior PCP. Will continue but discussed to use this sparingly. She uses once daily on average and still has plenty in her bottle. UDS and controlled substance contract updated.

## 2017-09-19 LAB — PAIN MGMT, PROFILE 8 W/CONF, U
6 ACETYLMORPHINE: NEGATIVE ng/mL (ref <10)
Alcohol Metabolites: NEGATIVE ng/mL (ref <500)
Amphetamines: NEGATIVE ng/mL (ref <500)
BUPRENORPHINE, URINE: NEGATIVE ng/mL (ref <5)
Benzodiazepines: NEGATIVE ng/mL (ref <100)
COCAINE METABOLITE: NEGATIVE ng/mL (ref <150)
Creatinine: 157.1 mg/dL
MDMA: NEGATIVE ng/mL (ref <500)
Marijuana Metabolite: NEGATIVE ng/mL (ref <20)
OXYCODONE: NEGATIVE ng/mL (ref <100)
Opiates: NEGATIVE ng/mL (ref <100)
Oxidant: NEGATIVE ug/mL (ref <200)
PH: 7.46 (ref 4.5–9.0)

## 2017-09-21 ENCOUNTER — Ambulatory Visit: Payer: Self-pay | Admitting: *Deleted

## 2017-09-21 DIAGNOSIS — J019 Acute sinusitis, unspecified: Secondary | ICD-10-CM

## 2017-09-21 MED ORDER — AMOXICILLIN-POT CLAVULANATE 875-125 MG PO TABS
1.0000 | ORAL_TABLET | Freq: Two times a day (BID) | ORAL | 0 refills | Status: DC
Start: 2017-09-21 — End: 2017-11-27

## 2017-09-21 NOTE — Telephone Encounter (Signed)
[facility];sinus pressure no better and teeth hurt; flonase, prednisone and irrigation not helping. Pt last seen 09/18/17.[name].Please advise.

## 2017-09-21 NOTE — Telephone Encounter (Signed)
Pt called due to complaints of continuing sinus pressure and was evaluated in office on 09/18/17; pt states that she has been following instructions per Mayra ReelKate Clark but symptoms are worsening;  nurse triage inititated and recommendation made that pt see MD within 3 days; since pt was just seen in office contacted Rena and directed me to route a message to the office; she will notify Mayra ReelKate Clark and further instruct the pt; the pt can best be contacted at [phone] Reason for Disposition . [1] Sinus congestion (pressure, fullness) AND [2] present > 10 days  Answer Assessment - Initial Assessment Questions 1. LOCATION: "Where does it hurt?"      Between eyes all the way up 2. ONSET: "When did the sinus pain start?"  (e.g., hours, days)      09/11/17 3. SEVERITY: "How bad is the pain?"   (Scale 1-10; mild, moderate or severe)   - MILD (1-3): doesn't interfere with normal activities    - MODERATE (4-7): interferes with normal activities (e.g., work or school) or awakens from sleep   - SEVERE (8-10): excruciating pain and patient unable to do any normal activities        Moderate to severe 4. RECURRENT SYMPTOM: "Have you ever had sinus problems before?" If so, ask: "When was the last time?" and "What happened that time?"      Yes seen by MD 09/18/17 5. NASAL CONGESTION: "Is the nose blocked?" If so, ask, "Can you open it or must you breathe through the mouth?"     yes 6. NASAL DISCHARGE: "Do you have discharge from your nose?" If so ask, "What color?"     no 7. FEVER: "Do you have a fever?" If so, ask: "What is it, how was it measured, and when did it start?"      no 8. OTHER SYMPTOMS: "Do you have any other symptoms?" (e.g., sore throat, cough, earache, difficulty breathing)     No energy, teeth hurt 9. PREGNANCY: "Is there any chance you are pregnant?" "When was your last menstrual period?"     No menopause  Protocols used: SINUS PAIN OR CONGESTION-A-AH

## 2017-09-21 NOTE — Addendum Note (Signed)
Addended by: Doreene NestLARK, Merrily Tegeler K on: 09/21/2017 01:12 PM   Modules accepted: Orders

## 2017-09-21 NOTE — Telephone Encounter (Signed)
Please notify patient that I sent antibiotics to her pharmacy. Start Augmentin antibiotics for the infection Take 1 tablet by mouth twice daily for 10 days.

## 2017-09-22 NOTE — Telephone Encounter (Signed)
Spoken and notified patient of Kate's comments. Patient verbalized understanding. 

## 2017-10-16 ENCOUNTER — Other Ambulatory Visit: Payer: Self-pay | Admitting: Family Medicine

## 2017-10-16 DIAGNOSIS — L4050 Arthropathic psoriasis, unspecified: Secondary | ICD-10-CM

## 2017-10-16 DIAGNOSIS — G894 Chronic pain syndrome: Secondary | ICD-10-CM

## 2017-10-16 NOTE — Telephone Encounter (Signed)
KC-Plz see refill req/thx dmf 

## 2017-11-12 ENCOUNTER — Other Ambulatory Visit: Payer: Self-pay | Admitting: Primary Care

## 2017-11-12 DIAGNOSIS — L4050 Arthropathic psoriasis, unspecified: Secondary | ICD-10-CM

## 2017-11-12 DIAGNOSIS — G894 Chronic pain syndrome: Secondary | ICD-10-CM

## 2017-11-13 NOTE — Telephone Encounter (Signed)
Rx sent to pharmacy, no suspicious activity through PMP aware.

## 2017-11-13 NOTE — Telephone Encounter (Signed)
Ok to refill? Electronically refill request for traMADol (ULTRAM) 50 MG tablet  Last prescribed on 10/16/2017. Last seen on 09/18/2017

## 2017-11-27 ENCOUNTER — Encounter: Payer: Self-pay | Admitting: Primary Care

## 2017-11-27 ENCOUNTER — Ambulatory Visit: Payer: Federal, State, Local not specified - PPO | Admitting: Primary Care

## 2017-11-27 ENCOUNTER — Ambulatory Visit: Payer: Self-pay

## 2017-11-27 VITALS — BP 120/76 | HR 74 | Temp 98.2°F | Wt 165.0 lb

## 2017-11-27 DIAGNOSIS — B9789 Other viral agents as the cause of diseases classified elsewhere: Secondary | ICD-10-CM

## 2017-11-27 DIAGNOSIS — J069 Acute upper respiratory infection, unspecified: Secondary | ICD-10-CM | POA: Diagnosis not present

## 2017-11-27 NOTE — Telephone Encounter (Signed)
Patient evaluated.  

## 2017-11-27 NOTE — Telephone Encounter (Signed)
Pt has appt 11/27/17 at 11 AM to see Allayne GitelmanK Clark NP.

## 2017-11-27 NOTE — Telephone Encounter (Signed)
Pt. States she started with cough,sore throat, sinus congestion yesterday. Cough is "severe." Yellow-green phlegm.Denies fever. OTC not helping.Requesting appointment. Scheduled for today. Reason for Disposition . [1] Continuous (nonstop) coughing interferes with work or school AND [2] no improvement using cough treatment per Care Advice  Answer Assessment - Initial Assessment Questions 1. ONSET: "When did the cough begin?"      Yesterday 2. SEVERITY: "How bad is the cough today?"      Severe 3. RESPIRATORY DISTRESS: "Describe your breathing."      No distress 4. FEVER: "Do you have a fever?" If so, ask: "What is your temperature, how was it measured, and when did it start?"     No 5. SPUTUM: "Describe the color of your sputum" (clear, white, yellow, green)     Yellow-green 6. HEMOPTYSIS: "Are you coughing up any blood?" If so ask: "How much?" (flecks, streaks, tablespoons, etc.)     No 7. CARDIAC HISTORY: "Do you have any history of heart disease?" (e.g., heart attack, congestive heart failure)      No 8. LUNG HISTORY: "Do you have any history of lung disease?"  (e.g., pulmonary embolus, asthma, emphysema)     No 9. PE RISK FACTORS: "Do you have a history of blood clots?" (or: recent major surgery, recent prolonged travel, bedridden )     No 10. OTHER SYMPTOMS: "Do you have any other symptoms?" (e.g., runny nose, wheezing, chest pain)       Sore throat, sinus congestion,Chest tightness 11. PREGNANCY: "Is there any chance you are pregnant?" "When was your last menstrual period?"       No 12. TRAVEL: "Have you traveled out of the country in the last month?" (e.g., travel history, exposures)       No  Protocols used: COUGH - ACUTE PRODUCTIVE-A-AH

## 2017-11-27 NOTE — Patient Instructions (Signed)
Your symptoms are representative of a viral illness which will resolve on its own over time. Our goal is to treat your symptoms in order to aid your body in the healing process and to make you more comfortable.   Continue Mucinex as directed, make sure to take this with a full glass of water.  You can try Robitussin or Delsym for cough.  You can try Claritin, Allegra, Zyrtec for runny nose, throat drainage, watery/itchy eyes.   Please notify me if you develop persistent fevers of 101 or feel worse after 1 week of onset of symptoms.   It was a pleasure to see you today!   Cough, Adult Coughing is a reflex that clears your throat and your airways. Coughing helps to heal and protect your lungs. It is normal to cough occasionally, but a cough that happens with other symptoms or lasts a long time may be a sign of a condition that needs treatment. A cough may last only 2-3 weeks (acute), or it may last longer than 8 weeks (chronic). What are the causes? Coughing is commonly caused by:  Breathing in substances that irritate your lungs.  A viral or bacterial respiratory infection.  Allergies.  Asthma.  Postnasal drip.  Smoking.  Acid backing up from the stomach into the esophagus (gastroesophageal reflux).  Certain medicines.  Chronic lung problems, including COPD (or rarely, lung cancer).  Other medical conditions such as heart failure.  Follow these instructions at home: Pay attention to any changes in your symptoms. Take these actions to help with your discomfort:  Take medicines only as told by your health care provider. ? If you were prescribed an antibiotic medicine, take it as told by your health care provider. Do not stop taking the antibiotic even if you start to feel better. ? Talk with your health care provider before you take a cough suppressant medicine.  Drink enough fluid to keep your urine clear or pale yellow.  If the air is dry, use a cold steam vaporizer or  humidifier in your bedroom or your home to help loosen secretions.  Avoid anything that causes you to cough at work or at home.  If your cough is worse at night, try sleeping in a semi-upright position.  Avoid cigarette smoke. If you smoke, quit smoking. If you need help quitting, ask your health care provider.  Avoid caffeine.  Avoid alcohol.  Rest as needed.  Contact a health care provider if:  You have new symptoms.  You cough up pus.  Your cough does not get better after 2-3 weeks, or your cough gets worse.  You cannot control your cough with suppressant medicines and you are losing sleep.  You develop pain that is getting worse or pain that is not controlled with pain medicines.  You have a fever.  You have unexplained weight loss.  You have night sweats. Get help right away if:  You cough up blood.  You have difficulty breathing.  Your heartbeat is very fast. This information is not intended to replace advice given to you by your health care provider. Make sure you discuss any questions you have with your health care provider. Document Released: 02/14/2011 Document Revised: 01/24/2016 Document Reviewed: 10/25/2014 Elsevier Interactive Patient Education  [employer]2018 Elsevier Inc.

## 2017-11-27 NOTE — Progress Notes (Signed)
Subjective:    Patient ID: Barbara Faulkner, female    DOB: 1966-07-11, 52 y.o.   MRN: [id number]  HPI  Barbara Faulkner is a 52 year old female with a history of allergic rhinitis, migraines who presents today with a chief complaint of cough.   She also reports chest congestion, nasal congestion, sore throat. Her symptoms began one day ago. She denies fevers, body aches, sick contacts. She took Mucinex with expulsion of green mucous this morning which scared her.  Review of Systems  Constitutional: Negative for chills, fatigue and fever.  HENT: Positive for congestion, postnasal drip and sore throat. Negative for ear pain and sinus pressure.   Respiratory: Positive for cough. Negative for shortness of breath and wheezing.   Cardiovascular: Negative for chest pain.       Past Medical History:  Diagnosis Date  . Allergy   . Chronic pain   . Chronic sinusitis   . Depression   . HPV (human papilloma virus) anogenital infection   . Migraine   . Psoriatic arthritis (HCC)      Social History   Socioeconomic History  . Marital status: Married    Spouse name: Not on file  . Number of children: 2  . Years of education: Not on file  . Highest education level: Not on file  Occupational History  . Occupation: Forensic scientist, Publishing rights manager: [employer]  Social Needs  . Financial resource strain: Not on file  . Food insecurity:    Worry: Not on file    Inability: Not on file  . Transportation needs:    Medical: Not on file    Non-medical: Not on file  Tobacco Use  . Smoking status: Former Smoker    Packs/day: 2.00    Years: 20.00    Pack years: 40.00    Types: Cigarettes    Last attempt to quit: 09/02/2003    Years since quitting: 14.2  . Smokeless tobacco: Never Used  Substance and Sexual Activity  . Alcohol use: No    Alcohol/week: 0.0 oz  . Drug use: No  . Sexual activity: Not on file  Lifestyle  . Physical activity:    Days per week: Not on file    Minutes  per session: Not on file  . Stress: Not on file  Relationships  . Social connections:    Talks on phone: Not on file    Gets together: Not on file    Attends religious service: Not on file    Active member of club or organization: Not on file    Attends meetings of clubs or organizations: Not on file    Relationship status: Not on file  . Intimate partner violence:    Fear of current or ex partner: Not on file    Emotionally abused: Not on file    Physically abused: Not on file    Forced sexual activity: Not on file  Other Topics Concern  . Not on file  Social History Narrative   Married.   1 child, 5 grandchildren.   Works for the post [employer].   Enjoys reading and exercising.     Past Surgical History:  Procedure Laterality Date  . CHOLECYSTECTOMY  2009  . GASTRIC BYPASS     2015  . LAPAROSCOPIC GASTRIC BANDING  2008    removed 2009  . TUBAL LIGATION  1997    Family History  Problem Relation Age of Onset  . HIV Father   .  Hypertension Mother   . Hyperlipidemia Mother   . Lung cancer Maternal Grandfather        smoker  . Hepatitis Maternal Uncle        drug use  . Colon cancer Neg Hx   . Colon polyps Neg Hx   . Rectal cancer Neg Hx   . Stomach cancer Neg Hx     Allergies  Allergen Reactions  . Shellfish Allergy Swelling    REACTION: throat closing  . Hydrocod Polst-Cpm Polst Er Other (See Comments)    Bad dreams    Current Outpatient Medications on File Prior to Visit  Medication Sig Dispense Refill  . Calcium-Magnesium-Zinc 167-83-8 MG TABS Take 1 tablet by mouth.    . Cholecalciferol (VITAMIN D) 2000 UNITS CAPS Take 3 capsules by mouth daily.    [name]. EPINEPHrine (EPIPEN 2-PAK) 0.3 mg/0.3 mL DEVI Inject as directed and then go to ER     . Iron-Folic Acid-Vit B12 (IRON FORMULA PO) Take 1 tablet by mouth daily.    . Multiple Vitamin (MULTIVITAMIN) tablet Take 1 tablet by mouth daily.    [name]. PARoxetine (PAXIL) 20 MG tablet Take 1 tablet (20 mg total) by mouth  daily. 30 tablet 11  . traMADol (ULTRAM) 50 MG tablet TAKE 1 TABLET BY MOUTH EVERY DAY AS NEEDED FOR SEVERE PAIN 30 tablet 0   No current facility-administered medications on file prior to visit.     BP 120/76   Pulse 74   Temp 98.2 F (36.8 C) (Oral)   Wt 165 lb (74.8 kg)   LMP 05/04/2014   SpO2 98%   BMI 28.32 kg/m    Objective:   Physical Exam  Constitutional: She appears well-nourished.  HENT:  Right Ear: Tympanic membrane and ear canal normal.  Left Ear: Tympanic membrane and ear canal normal.  Nose: Right sinus exhibits no maxillary sinus tenderness and no frontal sinus tenderness. Left sinus exhibits no maxillary sinus tenderness and no frontal sinus tenderness.  Mouth/Throat: Oropharynx is clear and moist.  Eyes: Conjunctivae are normal.  Neck: Neck supple.  Cardiovascular: Normal rate and regular rhythm.  Pulmonary/Chest: Effort normal and breath sounds normal. She has no wheezes. She has no rales.  Lymphadenopathy:    She has no cervical adenopathy.  Skin: Skin is warm and dry.          Assessment & Plan:  URI vs Allergic Rhinitis:  Cough, congestion, sore throat x 24 hours. Exam today without evidence of bacterial involvement.  Continue Mucinex with water. Discussed use of Robitussin or Delsym PRN. Discussed that symptoms may become worse before she feels better, but did provide her with return precautions. Fluids, rest, follow up PRN.  Doreene NestKatherine K Jarrod Bodkins, NP

## 2017-11-30 ENCOUNTER — Encounter: Payer: Self-pay | Admitting: *Deleted

## 2017-11-30 ENCOUNTER — Ambulatory Visit: Payer: Self-pay

## 2017-11-30 DIAGNOSIS — R059 Cough, unspecified: Secondary | ICD-10-CM

## 2017-11-30 DIAGNOSIS — R05 Cough: Secondary | ICD-10-CM

## 2017-11-30 MED ORDER — HYDROCOD POLST-CPM POLST ER 10-8 MG/5ML PO SUER: 5.0000 mL | Freq: Two times a day (BID) | ORAL | 0 refills | Status: DC | PRN

## 2017-11-30 NOTE — Telephone Encounter (Signed)
Spoken and notified patient of Kate's comments. Patient stated that she would like the cough medication with codeine and [facility] [address]

## 2017-11-30 NOTE — Telephone Encounter (Signed)
Please notify patient that I'm sorry to hear about her continued symptoms. A viral illness may last 7-10 days total which can be bothersome.  I'm happy to send in medication for her cough, we can do either the Tessalon Perles (non drowsy cough medication taken three times daily) or cough medication with codeine (taken at bedtime to help sleep).  Let me know what she decides.  Okay to provide work note. Have her call back Friday this week if she's feeling worse.

## 2017-11-30 NOTE — Telephone Encounter (Signed)
Pt. Called - reports she feels worse. OTC is not helping her cough. Has laryngitis as well. Productive cough with green mucus. Still has sinus congestion and sore throat. Requesting medication be called in to [facility] [address] Requests a note for work for today and tomorrow. Answer Assessment - Initial Assessment Questions 1. ONSET: "When did the cough begin?"      Started 2. SEVERITY: "How bad is the cough today?"       Thursday 3. RESPIRATORY DISTRESS: "Describe your breathing."      No distress 4. FEVER: "Do you have a fever?" If so, ask: "What is your temperature, how was it measured, and when did it start?"     No 5. SPUTUM: "Describe the color of your sputum" (clear, white, yellow, green)     Green 6. HEMOPTYSIS: "Are you coughing up any blood?" If so ask: "How much?" (flecks, streaks, tablespoons, etc.)     No 7. CARDIAC HISTORY: "Do you have any history of heart disease?" (e.g., heart attack, congestive heart failure)      No 8. LUNG HISTORY: "Do you have any history of lung disease?"  (e.g., pulmonary embolus, asthma, emphysema)     No 9. PE RISK FACTORS: "Do you have a history of blood clots?" (or: recent major surgery, recent prolonged travel, bedridden )     No 10. OTHER SYMPTOMS: "Do you have any other symptoms?" (e.g., runny nose, wheezing, chest pain)       Sinus congestion, laryngitis 11. PREGNANCY: "Is there any chance you are pregnant?" "When was your last menstrual period?"       No 12. TRAVEL: "Have you traveled out of the country in the last month?" (e.g., travel history, exposures)       No  Protocols used: COUGH - ACUTE PRODUCTIVE-A-AH

## 2017-11-30 NOTE — Telephone Encounter (Signed)
Noted, Rx sent to pharmacy. 

## 2017-12-08 ENCOUNTER — Other Ambulatory Visit: Payer: Self-pay | Admitting: Primary Care

## 2017-12-08 DIAGNOSIS — L4050 Arthropathic psoriasis, unspecified: Secondary | ICD-10-CM

## 2017-12-08 DIAGNOSIS — G894 Chronic pain syndrome: Secondary | ICD-10-CM

## 2017-12-09 NOTE — Telephone Encounter (Signed)
Ok to refill? Electronically refill request for traMADol (ULTRAM) 50 MG tablet  Last prescribed on 11/13/2017. Last seen on 11/27/2017

## 2017-12-09 NOTE — Telephone Encounter (Signed)
No suspicious activity on PMP aware, refill sent to pharmacy.

## 2017-12-10 DIAGNOSIS — K080 Exfoliation of teeth due to systemic causes: Secondary | ICD-10-CM | POA: Diagnosis not present

## 2017-12-17 ENCOUNTER — Other Ambulatory Visit: Payer: Self-pay | Admitting: Family Medicine

## 2017-12-17 DIAGNOSIS — F32A Depression, unspecified: Secondary | ICD-10-CM

## 2017-12-17 DIAGNOSIS — F419 Anxiety disorder, unspecified: Principal | ICD-10-CM

## 2017-12-17 DIAGNOSIS — F329 Major depressive disorder, single episode, unspecified: Secondary | ICD-10-CM

## 2017-12-30 ENCOUNTER — Other Ambulatory Visit: Payer: Self-pay | Admitting: Primary Care

## 2017-12-30 DIAGNOSIS — L4050 Arthropathic psoriasis, unspecified: Secondary | ICD-10-CM

## 2017-12-30 DIAGNOSIS — G894 Chronic pain syndrome: Secondary | ICD-10-CM

## 2017-12-31 NOTE — Telephone Encounter (Signed)
Ok to refill? Electronically refill request for traMADol (ULTRAM) 50 MG tablet  Last prescribed on 12/09/2017. Last seen on 11/27/2017

## 2017-12-31 NOTE — Telephone Encounter (Signed)
No suspicious activity on PMP aware, refill sent to pharmacy. UDS is up to date.

## 2018-01-11 ENCOUNTER — Encounter: Payer: Self-pay | Admitting: Family Medicine

## 2018-01-11 ENCOUNTER — Ambulatory Visit: Payer: Federal, State, Local not specified - PPO | Admitting: Family Medicine

## 2018-01-11 ENCOUNTER — Other Ambulatory Visit: Payer: Self-pay | Admitting: Family Medicine

## 2018-01-11 VITALS — BP 114/80 | HR 72 | Temp 98.4°F | Resp 16 | Ht 64.5 in | Wt 162.0 lb

## 2018-01-11 DIAGNOSIS — N6311 Unspecified lump in the right breast, upper outer quadrant: Secondary | ICD-10-CM

## 2018-01-11 NOTE — Progress Notes (Signed)
Subjective:    Patient ID: Barbara Faulkner, female    DOB: Dec 21, 1965, 52 y.o.   MRN: [id number]  HPI This is a 52 yo female who presents today with breast lump on right breast that she noticed last night. Last mammogram 10/27/16. She denies pain, nipple discharge. Had some bilateral breast tenderness several weeks ago that felt like PMS. Resolved.   Past Medical History:  Diagnosis Date  . Allergy   . Chronic pain   . Chronic sinusitis   . Depression   . HPV (human papilloma virus) anogenital infection   . Migraine   . Psoriatic arthritis [facility])    Past Surgical History:  Procedure Laterality Date  . CHOLECYSTECTOMY  2009  . GASTRIC BYPASS     2015  . LAPAROSCOPIC GASTRIC BANDING  2008    removed 2009  . TUBAL LIGATION  1997   Family History  Problem Relation Age of Onset  . HIV Father   . Hypertension Mother   . Hyperlipidemia Mother   . Lung cancer Maternal Grandfather        smoker  . Hepatitis Maternal Uncle        drug use  . Colon cancer Neg Hx   . Colon polyps Neg Hx   . Rectal cancer Neg Hx   . Stomach cancer Neg Hx    Social History   Tobacco Use  . Smoking status: Former Smoker    Packs/day: 2.00    Years: 20.00    Pack years: 40.00    Types: Cigarettes    Last attempt to quit: 09/02/2003    Years since quitting: 14.3  . Smokeless tobacco: Never Used  Substance Use Topics  . Alcohol use: No    Alcohol/week: 0.0 oz  . Drug use: No      Review of Systems     Objective:   Physical Exam  Constitutional: She is oriented to person, place, and time. She appears well-developed and well-nourished.  HENT:  Head: Normocephalic and atraumatic.  Eyes: Conjunctivae are normal.  Cardiovascular: Normal rate.  Pulmonary/Chest: Effort normal. She exhibits deformity. She exhibits no tenderness. Right breast exhibits mass and skin change. Right breast exhibits no inverted nipple, no nipple discharge and no tenderness. Left breast exhibits no mass, no nipple  discharge, no skin change and no tenderness.  Large, pendulous breasts. Right skin abnormality over mass visible in sitting position.     Neurological: She is alert and oriented to person, place, and time.  Skin: Skin is warm and dry.  Psychiatric: She has a normal mood and affect. Her behavior is normal. Judgment and thought content normal.  Vitals reviewed.     BP 114/80 (BP Location: Right Arm, Patient Position: Sitting, Cuff Size: Small)   Pulse 72   Temp 98.4 F (36.9 C) (Oral)   Resp 16   Ht 5' 4.5" (1.638 m)   Wt 162 lb (73.5 kg)   LMP 05/04/2014   SpO2 98%   BMI 27.38 kg/m  Wt Readings from Last 3 Encounters:  01/11/18 162 lb (73.5 kg)  11/27/17 165 lb (74.8 kg)  09/18/17 167 lb 12.8 oz (76.1 kg)       Assessment & Plan:  1. Breast lump on right side at 11 o'clock position - will schedule ASAP - MM DIAG BREAST TOMO BILATERAL; Future - US BREAST LTD UNI LEFT INC AXILLA; Future - US BREAST LTD UNI RIGHT INC AXILLA; Future   Olean Ree, FNP-BC  [facility]  at [facility], [facility]  01/11/2018 9:54 AM

## 2018-01-11 NOTE — Patient Instructions (Signed)
Please stop at the front desk to schedule your mammogram

## 2018-01-18 ENCOUNTER — Ambulatory Visit
Admission: RE | Admit: 2018-01-18 | Discharge: 2018-01-18 | Disposition: A | Discharge status: Home / Self Care | Payer: Federal, State, Local not specified - PPO | Source: Ambulatory Visit | Attending: Family Medicine | Admitting: Family Medicine

## 2018-01-18 ENCOUNTER — Other Ambulatory Visit: Payer: Self-pay | Admitting: Family Medicine

## 2018-01-18 DIAGNOSIS — N6310 Unspecified lump in the right breast, unspecified quadrant: Secondary | ICD-10-CM

## 2018-01-18 DIAGNOSIS — N6311 Unspecified lump in the right breast, upper outer quadrant: Secondary | ICD-10-CM

## 2018-01-18 DIAGNOSIS — R928 Other abnormal and inconclusive findings on diagnostic imaging of breast: Secondary | ICD-10-CM

## 2018-01-28 ENCOUNTER — Ambulatory Visit
Admission: RE | Admit: 2018-01-28 | Discharge: 2018-01-28 | Disposition: A | Discharge status: Home / Self Care | Payer: Federal, State, Local not specified - PPO | Source: Ambulatory Visit | Attending: Family Medicine | Admitting: Family Medicine

## 2018-01-28 DIAGNOSIS — N6310 Unspecified lump in the right breast, unspecified quadrant: Secondary | ICD-10-CM

## 2018-01-28 DIAGNOSIS — N641 Fat necrosis of breast: Secondary | ICD-10-CM | POA: Diagnosis not present

## 2018-01-28 DIAGNOSIS — R928 Other abnormal and inconclusive findings on diagnostic imaging of breast: Secondary | ICD-10-CM | POA: Diagnosis not present

## 2018-01-28 DIAGNOSIS — N6311 Unspecified lump in the right breast, upper outer quadrant: Secondary | ICD-10-CM | POA: Diagnosis not present

## 2018-01-28 HISTORY — PX: BREAST BIOPSY: SHX20

## 2018-01-29 LAB — SURGICAL PATHOLOGY

## 2018-02-03 ENCOUNTER — Other Ambulatory Visit: Payer: Self-pay | Admitting: Primary Care

## 2018-02-03 DIAGNOSIS — G894 Chronic pain syndrome: Secondary | ICD-10-CM

## 2018-02-03 DIAGNOSIS — L4050 Arthropathic psoriasis, unspecified: Secondary | ICD-10-CM

## 2018-02-04 NOTE — Telephone Encounter (Signed)
Ok to refill? Electronically refill request for traMADol (ULTRAM) 50 MG tablet  Last prescribed 12/31/2017  Last seen on 11/27/2017

## 2018-02-04 NOTE — Telephone Encounter (Signed)
No suspicious activity noted on PMP aware, UDS is up to date, refill sent to pharmacy.

## 2018-03-08 ENCOUNTER — Other Ambulatory Visit: Payer: Self-pay | Admitting: Primary Care

## 2018-03-08 DIAGNOSIS — L4050 Arthropathic psoriasis, unspecified: Secondary | ICD-10-CM

## 2018-03-08 DIAGNOSIS — G894 Chronic pain syndrome: Secondary | ICD-10-CM

## 2018-03-08 NOTE — Telephone Encounter (Signed)
Name of Medication: traMADol (ULTRAM) 50 MG tablet  Name of Pharmacy:[facility]   Last Fill or Written Date and Quantity: 02/04/2018 #30  Last Office Visit and Type: 11/27/2017 for acute appt  Next Office Visit and Type:   Last Controlled Substance Agreement Date: 09/18/2017  Last UDS:09/18/2017

## 2018-03-09 NOTE — Telephone Encounter (Signed)
Noted, PMP aware site reviewed, no suspicious activity. UDS is UTD. Refill sent to pharmacy.

## 2018-04-12 ENCOUNTER — Telehealth: Payer: Self-pay | Admitting: Primary Care

## 2018-04-12 DIAGNOSIS — R928 Other abnormal and inconclusive findings on diagnostic imaging of breast: Secondary | ICD-10-CM

## 2018-04-12 NOTE — Telephone Encounter (Signed)
Pt came in today needing to schedule   Pt stated [place] said she had to have office call to schedule  [facility] [place] orders need to be [phone]  Ultra sound follow up in 3 months

## 2018-04-12 NOTE — Telephone Encounter (Signed)
Noted, US orders placed.

## 2018-04-14 ENCOUNTER — Other Ambulatory Visit: Payer: Self-pay | Admitting: Primary Care

## 2018-04-14 DIAGNOSIS — L4050 Arthropathic psoriasis, unspecified: Secondary | ICD-10-CM

## 2018-04-14 DIAGNOSIS — G894 Chronic pain syndrome: Secondary | ICD-10-CM

## 2018-04-14 NOTE — Telephone Encounter (Signed)
Noted, Rx sent to pharmacy. No suspicious activity noted on PMP aware.

## 2018-04-14 NOTE — Telephone Encounter (Signed)
Name of Medication: traMADol (ULTRAM) 50 MG tablet  Name of Pharmacy: [facility]  Last Fill or Written Date and Quantity: 03/09/2018  #30  Last Office Visit and Type: 11/27/2017 for acute appt  Next Office Visit and Type:   Last Controlled Substance Agreement Date: 09/18/2017  Last UDS: 09/18/2017

## 2018-04-30 ENCOUNTER — Other Ambulatory Visit: Payer: Self-pay | Admitting: Family Medicine

## 2018-04-30 ENCOUNTER — Telehealth: Payer: Self-pay | Admitting: *Deleted

## 2018-04-30 MED ORDER — HYDROCORTISONE ACETATE 25 MG RE SUPP: 25.0000 mg | Freq: Two times a day (BID) | RECTAL | 0 refills | Status: DC

## 2018-04-30 NOTE — Telephone Encounter (Signed)
Copied from CRM [phone]. Topic: General - Other >> Apr 30, 2018 12:31 PM Percival SpanishKennedy, Cheryl W wrote:  Pt is asking if Mayra ReelKate Clark would call her in suppository for her hemorrhoids    Pharmacy [address] [address]

## 2018-04-30 NOTE — Telephone Encounter (Signed)
Please call and let her know that a prescription has been sent. If not better next week, please make an appointment.

## 2018-04-30 NOTE — Telephone Encounter (Signed)
Called and spoke with patient informing her of prescription refill and instructions. Understanding verbalized nothing further needed at this time.

## 2018-05-04 ENCOUNTER — Ambulatory Visit
Admission: RE | Admit: 2018-05-04 | Discharge: 2018-05-04 | Disposition: A | Discharge status: Home / Self Care | Payer: Federal, State, Local not specified - PPO | Source: Ambulatory Visit | Attending: Primary Care | Admitting: Primary Care

## 2018-05-04 DIAGNOSIS — N6001 Solitary cyst of right breast: Secondary | ICD-10-CM | POA: Diagnosis not present

## 2018-05-04 DIAGNOSIS — R928 Other abnormal and inconclusive findings on diagnostic imaging of breast: Secondary | ICD-10-CM | POA: Insufficient documentation

## 2018-05-10 DIAGNOSIS — H16143 Punctate keratitis, bilateral: Secondary | ICD-10-CM | POA: Diagnosis not present

## 2018-05-10 DIAGNOSIS — H18823 Corneal disorder due to contact lens, bilateral: Secondary | ICD-10-CM | POA: Diagnosis not present

## 2018-05-12 ENCOUNTER — Other Ambulatory Visit: Payer: Self-pay | Admitting: Primary Care

## 2018-05-12 DIAGNOSIS — L4050 Arthropathic psoriasis, unspecified: Secondary | ICD-10-CM

## 2018-05-12 DIAGNOSIS — G894 Chronic pain syndrome: Secondary | ICD-10-CM

## 2018-05-12 NOTE — Telephone Encounter (Signed)
Noted, no suspicious activity noted on PMP aware site. Refill sent to pharmacy. 

## 2018-05-12 NOTE — Telephone Encounter (Signed)
Name of Medication: traMADol (ULTRAM) 50 MG tablet  Name of Pharmacy: [facility]  Last Fill or Written Date and Quantity: 04/14/2018  #30  Last Office Visit and Type: 11/27/2017 for acute appt  Next Office Visit and Type: Patient is transferring to [place] station in November  Last Controlled Substance Agreement Date: 09/18/2017  Last UDS: 09/18/2017

## 2018-05-27 ENCOUNTER — Ambulatory Visit: Payer: Federal, State, Local not specified - PPO | Admitting: Family Medicine

## 2018-05-27 ENCOUNTER — Encounter: Payer: Self-pay | Admitting: Family Medicine

## 2018-05-27 ENCOUNTER — Ambulatory Visit (INDEPENDENT_AMBULATORY_CARE_PROVIDER_SITE_OTHER): Payer: Federal, State, Local not specified - PPO

## 2018-05-27 VITALS — BP 138/88 | HR 75 | Temp 98.0°F | Ht 63.0 in | Wt 163.4 lb

## 2018-05-27 DIAGNOSIS — Z23 Encounter for immunization: Secondary | ICD-10-CM

## 2018-05-27 DIAGNOSIS — M19011 Primary osteoarthritis, right shoulder: Secondary | ICD-10-CM | POA: Diagnosis not present

## 2018-05-27 DIAGNOSIS — M25511 Pain in right shoulder: Secondary | ICD-10-CM

## 2018-05-27 NOTE — Patient Instructions (Signed)
You can take tylenol with the tramadol  Biofreeze and/or bengay are good OTC options for joint pains   Shoulder Pain Many things can cause shoulder pain, including:  An injury.  Moving the arm in the same way again and again (overuse).  Joint pain (arthritis).  Follow these instructions at home: Take these actions to help with your pain:  Squeeze a soft ball or a foam pad as much as you can. This helps to prevent swelling. It also makes the arm stronger.  Take over-the-counter and prescription medicines only as told by your doctor.  If told, put ice on the area: ? Put ice in a plastic bag. ? Place a towel between your skin and the bag. ? Leave the ice on for 20 minutes, 2-3 times per day. Stop putting on ice if it does not help with the pain.  If you were given a shoulder sling or immobilizer: ? Wear it as told. ? Remove it to shower or bathe. ? Move your arm as little as possible. ? Keep your hand moving. This helps prevent swelling.  Contact a doctor if:  Your pain gets worse.  Medicine does not help your pain.  You have new pain in your arm, hand, or fingers. Get help right away if:  Your arm, hand, or fingers: ? Tingle. ? Are numb. ? Are swollen. ? Are painful. ? Turn white or blue. This information is not intended to replace advice given to you by your health care provider. Make sure you discuss any questions you have with your health care provider. Document Released: 02/04/2008 Document Revised: 04/13/2016 Document Reviewed: 12/11/2014 Elsevier Interactive Patient Education  [employer].

## 2018-05-27 NOTE — Progress Notes (Signed)
Subjective:    Patient ID: Barbara Faulkner, female    DOB: [birth year], 52 y.o.   MRN: [id number]  HPI   Patient presents to clinic complaining of right shoulder pain for past 2 weeks.  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  Describes the pain as an aching type pain, which is worse when she tries to raise arm above head or reach across his chest.  States it has been so bad some mornings her husband has had to help her get dressed.  Patient uses tramadol as needed for chronic joint pain related to Lyme disease history.  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  Patient Active Problem List   Diagnosis Date Noted  . Hemorrhoids 05/01/2017  . Anxiety and depression 11/24/2016  . Back pain 09/11/2016  . Large breasts 09/11/2016  . Parotiditis 05/01/2016  . Abnormal laboratory test result 03/26/2016  . Proteinuria 03/26/2016  . Right hip pain 04/18/2015  . Psoriatic arthritis (HCC) 09/13/2013  . DUB (dysfunctional uterine bleeding) 10/27/2012  . 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 THYROMEGALY 09/03/2010  . ALLERGIC RHINITIS 03/15/2010  . MORBID OBESITY 05/17/2008  . MIGRAINE W/AURA W/O INTRACT W/O STATUS MIGRNOSUS 05/17/2008   Social History   Tobacco Use  . Smoking status: Former Smoker    Packs/day: 2.00    Years: 20.00    Pack years: 40.00    Types: Cigarettes    Last attempt to quit: 09/02/2003    Years since quitting: 14.7  . Smokeless tobacco: Never Used  Substance Use Topics  . Alcohol use: No    Alcohol/week: 0.0 standard drinks   Review of Systems  Constitutional: Negative for chills, fatigue and fever.  HENT: Negative for congestion, ear pain, sinus pain and sore throat.   Eyes: Negative.   Respiratory: Negative for cough, shortness of breath and wheezing.   Cardiovascular:  Negative for chest pain, palpitations and leg swelling.  Gastrointestinal: Negative for abdominal pain, diarrhea, nausea and vomiting.  Genitourinary: Negative for dysuria, frequency and urgency.  Musculoskeletal: right shoulder pain Skin: Negative for color change, pallor and rash.  Neurological: Negative for syncope, light-headedness and headaches.  Psychiatric/Behavioral: The patient is not nervous/anxious.       Objective:   Physical Exam  Constitutional: She is oriented to person, place, and time. No distress.  HENT:  Head: Normocephalic and atraumatic.  Eyes: No scleral icterus.  Neck: Normal range of motion. Neck supple. No tracheal deviation present.  Cardiovascular: Normal rate and regular rhythm.  +radial/ulnar pulses  Pulmonary/Chest: Effort normal and breath sounds normal. No respiratory distress.  Musculoskeletal: She exhibits tenderness. She exhibits no edema or deformity.  Right shoulder tenderness at the [facility] joint.  Patient has pain when attempting to lift right arm straight up above head and reach right arm across chest.  Grip strength equal bilaterally.  Patient is able to hold out right arm to side on her own and resist me pushing down.  Neurological: She is alert and oriented to person, place, and time. No cranial nerve deficit.  Skin: Skin is warm and dry. Capillary refill takes less than 2 seconds. No erythema. No pallor.  Psychiatric: She has a normal mood and affect. Her behavior is normal.  Nursing note and vitals reviewed.  Vitals:   05/27/18 1536  BP: 138/88  Pulse: 75  Temp: 98 F (36.7 C)  SpO2: 98%      Assessment & Plan:   Right shoulder pain- we will get x-ray of right shoulder.  Patient will use her tramadol as needed for pain, also advised she can use Tylenol as needed for pain as well.  Discussed different topical over-the-counter options that are good choices for joint pain including BenGay rub and Biofreeze rub.  I suspect possible arthritis in  shoulder, in addition to a sprain/strain due to overuse.  Pending x-ray results, we will consider referral to physical therapy and/or orthopedics for further evaluation.  Keep regular follow-up as already scheduled.  Return to clinic sooner if issues arise.  Flu vaccine given in clinic today.

## 2018-05-28 MED ORDER — METHYLPREDNISOLONE 4 MG PO TBPK
ORAL_TABLET | ORAL | 0 refills | Status: DC
Start: 2018-05-28 — End: 2018-06-14

## 2018-05-28 NOTE — Progress Notes (Unsigned)
Orthopedic referral.

## 2018-05-31 ENCOUNTER — Telehealth: Payer: Self-pay | Admitting: Primary Care

## 2018-05-31 DIAGNOSIS — G894 Chronic pain syndrome: Secondary | ICD-10-CM

## 2018-05-31 DIAGNOSIS — L4050 Arthropathic psoriasis, unspecified: Secondary | ICD-10-CM

## 2018-05-31 NOTE — Telephone Encounter (Signed)
Name of Medication:  Name of Pharmacy:  Last Fill or Written Date and Quantity:  Last Office Visit and Type:  Next Office Visit and Type:  Last Controlled Substance Agreement Date:  Last UDS:   

## 2018-06-01 ENCOUNTER — Telehealth: Payer: Self-pay | Admitting: Internal Medicine

## 2018-06-01 NOTE — Telephone Encounter (Signed)
Per med list tramadol # 30 on 05/12/18. Is it too early to fill?

## 2018-06-01 NOTE — Telephone Encounter (Signed)
Pt called checking on refill tramodal 50 mg.  Pt stated she is leaving tomorrow 10/2 to go on a cruse and needs rx before she leave Please advise status of refill Pt stated she [facility] rx is pending

## 2018-06-01 NOTE — Telephone Encounter (Signed)
PLEASE NOTE: All timestamps contained within this report are represented as [place] Standard Time. CONFIDENTIALTY NOTICE: This fax transmission is intended only for the addressee. It contains information that is legally privileged, confidential or otherwise protected from use or disclosure. If you are not the intended recipient, you are strictly prohibited from reviewing, disclosing, copying using or disseminating any of this information or taking any action in reliance on or regarding this information. If you have received this fax in error, please notify us immediately by telephone so that we can arrange for its return to [place]. Phone: [phone], Toll-Free: [phone], Fax: [phone] Page: 1 of 1 Call Id: [id number] [facility] Night - Client TELEPHONE ADVICE RECORD [facility] Patient Name: Barbara Faulkner Gender: Female DOB: [birth year] Age: 52 Y 11 M 22 D Return Phone Number: [phone] (Primary) Address: City/State/Zip: [place] [address] Client [facility] Night - Client Client Site [facility] - Night Contact Type Call Who Is Calling Patient / Member / Family / Caregiver Call Type Triage / Clinical Relationship To Patient Self Return Phone Number [phone] (Primary) Chief Complaint Pain - Generalized Reason for Call Symptomatic / Request for Health Information Initial Comment Caller needs a refill on her Tramadol 50mg  and she is going on vacation tomorrow. There is a hold up at [facility] in [place]. What is the hold up? She has pain in general. Translation No Nurse Assessment Nurse: Debera Lat, RN, Tinnie Gens Date/Time Lamount Cohen Time): 06/01/2018 10:09:10 AM Please select the assessment type ---Refill Additional Documentation ---Caller needs a refill on her Tramadol 50mg  and she is going on vacation tomorrow. There is a hold up at [facility] in [place]. What is the hold up? She has pain  in general. Does the patient have enough medication to last until the office opens? ---Yes Additional Documentation ---Caller is not out of medication and pharmacy is waiting to hear back from the office about authorization on Rx. Guidelines Guideline Title Affirmed Question Affirmed Notes Nurse Date/Time (Eastern Time) Disp. Time Lamount Cohen Time) Disposition Final User 06/01/2018 10:15:21 AM Clinical Call Yes Debera Lat, RN, Tinnie Gens Comments User: Ed Blalock, RN Date/Time Lamount Cohen Time): 06/01/2018 10:15:11 AM Spoke with office on backline and they advised they would send note to medical assistant for PCP about needing refill authorized since caller is going out of town tomorrow on a cruise.

## 2018-06-01 NOTE — Telephone Encounter (Signed)
Refill too soon. Declined until able to refill.

## 2018-06-03 NOTE — Telephone Encounter (Signed)
Pt calling to speak to Mayra Reel.  Pt states she had to take more due to her shoulder pain and with the past Rx, it was only 30 tabs. Pt states she is leaving for a cruise on Sat, and needs to take with her. Pt would like a call back. Pt states she is in a lot of pain with her shoulder. [phone]  Pt states she is in [place] at the moment, that is why she needs to speak with someone to have the med sent down there

## 2018-06-03 NOTE — Telephone Encounter (Signed)
° ° °  Pt said she is completely out of medicine.She said she has been taking 2 pills a day that is why she is out .She is going on a cruise Sat and does not want to be in pain the whole trip    [phone]

## 2018-06-03 NOTE — Telephone Encounter (Signed)
Barbara Faulkner is out of the office until Tuesday. She already declined refill. I will not refill at this time.

## 2018-06-04 MED ORDER — TRAMADOL HCL 50 MG PO TABS: 50.0000 mg | ORAL_TABLET | Freq: Every day | ORAL | 0 refills | Status: DC | PRN

## 2018-06-04 NOTE — Telephone Encounter (Signed)
There is documentation of patient seeing provider last week at [place] station for shoulder pain.  Will fill #5 pills through weekend and see if it can be filled in [place].  For future, pt needs to contact PCP regarding increased pain and increased use of tramadol to come up with plan prior to running out.

## 2018-06-04 NOTE — Addendum Note (Signed)
Addended by: Eustaquio Boyden on: 06/04/2018 12:43 PM   Modules accepted: Orders

## 2018-06-04 NOTE — Telephone Encounter (Signed)
I consulted with Dr. Sharen Hones given circumstances.  Patient did see a physician on 9/26 for acute shoulder issues and there is documented increase in pain.  I did explain to patient that she needs to communicate with [place] well in advance of running out of medication if she is experiencing more pain and needing to take additional medication.  Patient verbalizes understanding and thanks [place] for our efforts to assist her.

## 2018-06-04 NOTE — Telephone Encounter (Signed)
Spoken to patient and notified her that it is too soon to fill the medication. Redgie Grayer and Regina's comments. Patient is insisting to have this medication refill. She stated that she needs this medication to be refill and it does not have to be a whole 30 days worth, she would take 5 pills to last her through her cruise. Patient admit that she will at times take this more than she was directed to. Inform her that this is a control medication. Patient did not listen to me and stated that I don't understand the kind of pain she is in. She wanted to speak to someone with a higher position than me to resolve this issue.

## 2018-06-04 NOTE — Telephone Encounter (Signed)
Spoke with patient.  She states that she injured her shoulder this month and has had to take some extra tramadol as a result and that is why she has ran out prior to her 10/11 refill.  She is leaving for a cruise tomorrow and is requesting 5 pills to be called in to the pharmacy in [place] before leaving because she is having a lot of pain:  [facility]  [address]  [place] [address]  I will consult with a provider and be sure they are aware of circumstances; however, due to this being a controlled substance we are limited in what we can do.  I make her no promises and prepare her that there may be nothing further that can be done.

## 2018-06-05 NOTE — Telephone Encounter (Addendum)
Noted and agree with the decisions made, thank you to all who were involved. She will be establishing with a new PCP in November. Will refill her current Rx when it's due, but she'll need to discuss her increased symptoms with her new PCP if this becomes chronic.

## 2018-06-14 ENCOUNTER — Other Ambulatory Visit: Payer: Self-pay | Admitting: Family Medicine

## 2018-06-14 ENCOUNTER — Telehealth: Payer: Self-pay | Admitting: *Deleted

## 2018-06-14 ENCOUNTER — Ambulatory Visit (INDEPENDENT_AMBULATORY_CARE_PROVIDER_SITE_OTHER): Payer: Federal, State, Local not specified - PPO | Admitting: Internal Medicine

## 2018-06-14 ENCOUNTER — Encounter: Payer: Self-pay | Admitting: Internal Medicine

## 2018-06-14 DIAGNOSIS — L4050 Arthropathic psoriasis, unspecified: Secondary | ICD-10-CM | POA: Diagnosis not present

## 2018-06-14 DIAGNOSIS — G894 Chronic pain syndrome: Secondary | ICD-10-CM

## 2018-06-14 DIAGNOSIS — M25511 Pain in right shoulder: Secondary | ICD-10-CM

## 2018-06-14 DIAGNOSIS — J0100 Acute maxillary sinusitis, unspecified: Secondary | ICD-10-CM

## 2018-06-14 DIAGNOSIS — G8929 Other chronic pain: Secondary | ICD-10-CM

## 2018-06-14 MED ORDER — PREDNISONE 10 MG PO TABS: ORAL_TABLET | ORAL | 0 refills | Status: DC

## 2018-06-14 MED ORDER — TRAMADOL HCL 50 MG PO TABS: ORAL_TABLET | ORAL | 0 refills | Status: DC

## 2018-06-14 MED ORDER — AMOXICILLIN-POT CLAVULANATE 875-125 MG PO TABS: 1.0000 | ORAL_TABLET | Freq: Two times a day (BID) | ORAL | 0 refills | Status: DC

## 2018-06-14 NOTE — Progress Notes (Signed)
Subjective:  Patient ID: Barbara Faulkner, female    DOB: [birth year]  Age: 52 y.o. MRN: [id number]  CC: Diagnoses of Psoriatic arthritis (HCC), Chronic pain syndrome, Acute maxillary sinusitis, recurrence not specified, and Chronic right shoulder pain were pertinent to this visit.  HPI Barbara Faulkner presents for s/s of bilateral maxillary sinusitis since  The beginning her her [employer] [place] on Oct 2 . Has been using otc meds, saline nasall spray tylenol motrin and theraflu .  She has had ccongestion and persistent   pain in both sinuses  And bilateral ear fullness.  2) requesting one time tramadol refill .  Has been unable to obtain refill from regular provider .  Has tendonitis in right shoulder and psoriatic arthritis managed with one tramadol daily    Outpatient Medications Prior to Visit  Medication Sig Dispense Refill  . Calcium-Magnesium-Zinc 167-83-8 MG TABS Take 1 tablet by mouth.    . Cholecalciferol (VITAMIN D) 2000 UNITS CAPS Take 3 capsules by mouth daily.    [name] EPINEPHrine (EPIPEN 2-PAK) 0.3 mg/0.3 mL DEVI Inject as directed and then go to ER     . Multiple Vitamin (MULTIVITAMIN) tablet Take 1 tablet by mouth daily.    [name] PARoxetine (PAXIL) 20 MG tablet TAKE 1 TABLET(20 MG) BY MOUTH DAILY 90 tablet 1  . traMADol (ULTRAM) 50 MG tablet TAKE 1 TABLET BY MOUTH EVERY DAY AS NEEDED FOR SEVERE PAIN 30 tablet 0  . chlorpheniramine-HYDROcodone (TUSSIONEX PENNKINETIC ER) 10-8 MG/5ML SUER Take 5 mLs by mouth every 12 (twelve) hours as needed for cough. (Patient not taking: Reported on 06/14/2018) 50 mL 0  . hydrocortisone (ANUSOL-HC) 25 MG suppository Place 1 suppository (25 mg total) rectally 2 (two) times daily. (Patient not taking: Reported on 06/14/2018) 12 suppository 0  . Iron-Folic Acid-Vit B12 (IRON FORMULA PO) Take 1 tablet by mouth daily.    . methylPREDNISolone (MEDROL DOSEPAK) 4 MG TBPK tablet Take according to pack instructions (Patient not taking: Reported on 06/14/2018)  21 tablet 0  . traMADol (ULTRAM) 50 MG tablet Take 1 tablet (50 mg total) by mouth daily as needed for severe pain. (Patient not taking: Reported on 06/14/2018) 5 tablet 0   No facility-administered medications prior to visit.     Review of Systems;  Patient denies headache, fevers, malaise, unintentional weight loss, skin rash, eye pain, sinus congestion and sinus pain, sore throat, dysphagia,  hemoptysis , cough, dyspnea, wheezing, chest pain, palpitations, orthopnea, edema, abdominal pain, nausea, melena, diarrhea, constipation, flank pain, dysuria, hematuria, urinary  Frequency, nocturia, numbness, tingling, seizures,  Focal weakness, Loss of consciousness,  Tremor, insomnia, depression, anxiety, and suicidal ideation.      Objective:  BP 118/74 (BP Location: Left Arm, Patient Position: Sitting, Cuff Size: Normal)   Pulse 88   Temp 98 F (36.7 C) (Oral)   Resp 15   Ht 5\' 3"  (1.6 m)   Wt 166 lb 12.8 oz (75.7 kg)   LMP 05/04/2014   SpO2 97%   BMI 29.55 kg/m   BP Readings from Last 3 Encounters:  06/14/18 118/74  05/27/18 138/88  01/11/18 114/80    Wt Readings from Last 3 Encounters:  06/14/18 166 lb 12.8 oz (75.7 kg)  05/27/18 163 lb 6.4 oz (74.1 kg)  01/11/18 162 lb (73.5 kg)    General appearance: alert, cooperative and appears stated age Ears: dull  TM's with mild injection.  Normal  external ear canals both ears Face: bilateral maxillary sinus tenderness  Throat: lips,  mucosa, and tongue normal; teeth and gums normal Neck: tender cervical  adenopathy, no carotid bruit, supple, symmetrical, trachea midline and thyroid not enlarged, symmetric, no tenderness/mass/nodules Back: symmetric, no curvature. ROM normal. No CVA tenderness. Lungs: clear to auscultation bilaterally Heart: regular rate and rhythm, S1, S2 normal, no murmur, click, rub or gallop Abdomen: soft, non-tender; bowel sounds normal; no masses,  no organomegaly Pulses: 2+ and symmetric Skin: Skin color,  texture, turgor normal. No rashes or lesions Lymph nodes: Cervical, supraclavicular, and axillary nodes normal.  Lab Results  Component Value Date   HGBA1C 5.5 10/04/2013    Lab Results  Component Value Date   CREATININE 0.96 03/26/2016   CREATININE 1.17 09/25/2014   CREATININE 0.71 02/15/2014    Lab Results  Component Value Date   WBC 14.3 (H) 09/25/2014   HGB 10.5 (L) 09/25/2014   HCT 32.7 (L) 09/25/2014   PLT 210.0 09/25/2014   GLUCOSE 90 03/26/2016   CHOL 119 09/25/2014   TRIG 89.0 09/25/2014   HDL 30.70 (L) 09/25/2014   LDLCALC 71 09/25/2014   ALT 33 03/26/2016   AST 34 03/26/2016   NA 144 03/26/2016   K 3.1 (L) 03/26/2016   CL 112 03/26/2016   CREATININE 0.96 03/26/2016   BUN 15 03/26/2016   CO2 25 03/26/2016   TSH 0.57 09/25/2014   INR 0.9 10/04/2013   HGBA1C 5.5 10/04/2013    US Breast Ltd Uni Right Inc Axilla  Result Date: 05/04/2018 CLINICAL DATA:  52 year old who presented with a palpable lump in UPPER OUTER QUADRANT of the RIGHT breast in May, 2019 and had a benign core needle biopsy revealing fat necrosis. Patient states that she no longer feels the lump. Three-month interval follow-up. EXAM: ULTRASOUND OF THE RIGHT BREAST COMPARISON:  01/18/2018. FINDINGS: The previously identified complex cyst, indicating a benign oil cyst, has resolved in the interval. Normal fibroglandular tissue is present at the 10 o'clock position approximately 5 cm from the nipple at the site of the prior oil cyst/fat necrosis. IMPRESSION: Resolution of the biopsy-proven benign fat necrosis and oil cyst involving the UPPER OUTER QUADRANT of the RIGHT breast. Normal examination currently. RECOMMENDATION: Annual BILATERAL screening mammography which is due in May, 2020. I have discussed the findings and recommendations with the patient. Results were also provided in writing at the conclusion of the visit. If applicable, a reminder letter will be sent to the patient regarding the next  appointment. BI-RADS CATEGORY  1: Negative. Electronically Signed   By: Hulan Saas M.D.   On: 05/04/2018 10:24    Assessment & Plan:   Problem List Items Addressed This Visit    Acute sinusitis    Given chronicity of symptoms, development of facial pain and exam consistent with bacterial URI,  Will treat with empiric antibiotics, steroid taper, oral/topical decongestants, and saline lavage.        Relevant Medications   amoxicillin-clavulanate (AUGMENTIN) 875-125 MG tablet   predniSONE (DELTASONE) 10 MG tablet   Psoriatic arthritis (HCC)   Relevant Medications   predniSONE (DELTASONE) 10 MG tablet   traMADol (ULTRAM) 50 MG tablet   Right shoulder pain    One time refill of tramadol given until patient can follow up with primary provider.        Other Visit Diagnoses    Chronic pain syndrome       Relevant Medications   traMADol (ULTRAM) 50 MG tablet      I have discontinued Fusako Maffett's Iron-Folic Acid-Vit B12 (IRON FORMULA  PO), chlorpheniramine-HYDROcodone, hydrocortisone, methylPREDNISolone, and traMADol. I have also changed her traMADol. Additionally, I am having her start on amoxicillin-clavulanate and predniSONE. Lastly, I am having her maintain her EPINEPHrine, Calcium-Magnesium-Zinc, Vitamin D, multivitamin, and PARoxetine.  Meds ordered this encounter  Medications  . amoxicillin-clavulanate (AUGMENTIN) 875-125 MG tablet    Sig: Take 1 tablet by mouth 2 (two) times daily.    Dispense:  14 tablet    Refill:  0  . predniSONE (DELTASONE) 10 MG tablet    Sig: 6 tablets on Day 1 , then reduce by 1 tablet daily until gone    Dispense:  21 tablet    Refill:  0  . traMADol (ULTRAM) 50 MG tablet    Sig: One tablet daily for shoulder pain    Dispense:  30 tablet    Refill:  0  A total of 25 minutes of face to face time was spent with patient more than half of which was spent in counselling about the above mentioned conditions  and coordination of care   Medications  Discontinued During This Encounter  Medication Reason  . chlorpheniramine-HYDROcodone (TUSSIONEX PENNKINETIC ER) 10-8 MG/5ML SUER Completed Course  . hydrocortisone (ANUSOL-HC) 25 MG suppository Completed Course  . Iron-Folic Acid-Vit B12 (IRON FORMULA PO) Completed Course  . methylPREDNISolone (MEDROL DOSEPAK) 4 MG TBPK tablet Completed Course  . traMADol (ULTRAM) 50 MG tablet Duplicate  . traMADol (ULTRAM) 50 MG tablet Reorder    Follow-up: No follow-ups on file.   Sherlene Shams, MD

## 2018-06-14 NOTE — Telephone Encounter (Signed)
Copied from CRM [id number]. Topic: Referral - Status >> Jun 14, 2018  9:45 AM Herby Abraham C wrote: Reason for CRM: pt called in to check the status of her Ortho referral   CB: [phone]

## 2018-06-14 NOTE — Telephone Encounter (Signed)
Inform patient  I have never seen this patient if she needs Tramadol refilled must get from former PCP until established with me if former PCP deems this is ok otherwise will have to wait until establishes with me and I will determine if I think tramadol is appropriate or if she needs pain clinic referral   TMS

## 2018-06-14 NOTE — Telephone Encounter (Signed)
Sent to Dr. Judie Grieve since she was listed as PCP. See note. Has appointment scheduled today with Dr. Darrick Huntsman for acute visit.

## 2018-06-14 NOTE — Patient Instructions (Addendum)
I am treating you for sinusitis/otitis which is a complication from your viral infection due to  persistent sinus congestion.   I am prescribing an antibiotic (augmentin ) and a prednisone taper  To manage the infection and the inflammation in your ear/sinuses.   I also advise use of the following OTC meds to help with your other symptoms.   Take generic OTC benadryl 25 mg  JUST AT BEDTIME  for the drainage,  Sudafed PE  10 to 30 mg every 8 hours for the congestion, you may substitute Afrin nasal spray for the nighttime dose of sudafed PE  If needed to prevent insomnia.  0NCE YOU HAVE RECOVERED,  You should consider daily use of  NeilMed's Sinus rinse ;  It is a strong sinus "flush" using water and medicated salts.  Do it over the sink because it can be a bit messy  Please take a probiotic ( Align, Floraque or Culturelle) while you are on the antibiotic for a minimum of 3 weeks,  to prevent  the  serious antibiotic associated diarrhea  Called clostridium dificile colitis and a vaginal yeast infection   I have refilled your tramadol for 30 days as a one time courtesy until you can see your regular provider

## 2018-06-14 NOTE — Telephone Encounter (Addendum)
It looks like Dr. Darrick Huntsman has refilled her Tramadol. I appreciate her assistance.

## 2018-06-15 DIAGNOSIS — M25511 Pain in right shoulder: Secondary | ICD-10-CM | POA: Insufficient documentation

## 2018-06-15 NOTE — Assessment & Plan Note (Signed)
One time refill of tramadol given until patient can follow up with primary provider.

## 2018-06-15 NOTE — Assessment & Plan Note (Signed)
Given chronicity of symptoms, development of facial pain and exam consistent with bacterial URI,  Will treat with empiric antibiotics, steroid taper, oral/topical decongestants, and saline lavage.

## 2018-06-18 NOTE — Telephone Encounter (Signed)
Referral was not sent to [facility], so it was not done. It has now been sent to [facility]. They will call her to schedule this appt.

## 2018-06-30 ENCOUNTER — Other Ambulatory Visit: Payer: Self-pay | Admitting: Sports Medicine

## 2018-06-30 DIAGNOSIS — M25511 Pain in right shoulder: Secondary | ICD-10-CM | POA: Diagnosis not present

## 2018-06-30 DIAGNOSIS — M7501 Adhesive capsulitis of right shoulder: Secondary | ICD-10-CM

## 2018-06-30 DIAGNOSIS — M19211 Secondary osteoarthritis, right shoulder: Secondary | ICD-10-CM

## 2018-06-30 DIAGNOSIS — M7531 Calcific tendinitis of right shoulder: Secondary | ICD-10-CM

## 2018-06-30 DIAGNOSIS — M7541 Impingement syndrome of right shoulder: Secondary | ICD-10-CM | POA: Diagnosis not present

## 2018-06-30 DIAGNOSIS — G8929 Other chronic pain: Secondary | ICD-10-CM

## 2018-07-06 ENCOUNTER — Encounter: Payer: Self-pay | Admitting: Internal Medicine

## 2018-07-06 ENCOUNTER — Ambulatory Visit: Payer: Federal, State, Local not specified - PPO | Admitting: Internal Medicine

## 2018-07-06 VITALS — BP 116/80 | HR 86 | Temp 97.8°F | Ht 63.0 in | Wt 161.6 lb

## 2018-07-06 DIAGNOSIS — M25511 Pain in right shoulder: Secondary | ICD-10-CM

## 2018-07-06 DIAGNOSIS — K649 Unspecified hemorrhoids: Secondary | ICD-10-CM

## 2018-07-06 DIAGNOSIS — E538 Deficiency of other specified B group vitamins: Secondary | ICD-10-CM

## 2018-07-06 DIAGNOSIS — F32A Depression, unspecified: Secondary | ICD-10-CM

## 2018-07-06 DIAGNOSIS — E876 Hypokalemia: Secondary | ICD-10-CM

## 2018-07-06 DIAGNOSIS — M1990 Unspecified osteoarthritis, unspecified site: Secondary | ICD-10-CM | POA: Diagnosis not present

## 2018-07-06 DIAGNOSIS — T782XXA Anaphylactic shock, unspecified, initial encounter: Secondary | ICD-10-CM

## 2018-07-06 DIAGNOSIS — F419 Anxiety disorder, unspecified: Secondary | ICD-10-CM

## 2018-07-06 DIAGNOSIS — Z1231 Encounter for screening mammogram for malignant neoplasm of breast: Secondary | ICD-10-CM

## 2018-07-06 DIAGNOSIS — D509 Iron deficiency anemia, unspecified: Secondary | ICD-10-CM

## 2018-07-06 DIAGNOSIS — Z8719 Personal history of other diseases of the digestive system: Secondary | ICD-10-CM

## 2018-07-06 DIAGNOSIS — Z1211 Encounter for screening for malignant neoplasm of colon: Secondary | ICD-10-CM

## 2018-07-06 DIAGNOSIS — Z1329 Encounter for screening for other suspected endocrine disorder: Secondary | ICD-10-CM

## 2018-07-06 DIAGNOSIS — Z13220 Encounter for screening for lipoid disorders: Secondary | ICD-10-CM

## 2018-07-06 DIAGNOSIS — L4050 Arthropathic psoriasis, unspecified: Secondary | ICD-10-CM | POA: Diagnosis not present

## 2018-07-06 DIAGNOSIS — F329 Major depressive disorder, single episode, unspecified: Secondary | ICD-10-CM

## 2018-07-06 DIAGNOSIS — R252 Cramp and spasm: Secondary | ICD-10-CM

## 2018-07-06 DIAGNOSIS — R1013 Epigastric pain: Secondary | ICD-10-CM

## 2018-07-06 DIAGNOSIS — E559 Vitamin D deficiency, unspecified: Secondary | ICD-10-CM

## 2018-07-06 DIAGNOSIS — N62 Hypertrophy of breast: Secondary | ICD-10-CM

## 2018-07-06 DIAGNOSIS — G8929 Other chronic pain: Secondary | ICD-10-CM

## 2018-07-06 MED ORDER — EPINEPHRINE 0.3 MG/0.3ML IJ SOAJ: 0.3000 mg | Freq: Once | INTRAMUSCULAR | 1 refills | Status: AC

## 2018-07-06 MED ORDER — PAROXETINE HCL 40 MG PO TABS: ORAL_TABLET | ORAL | 1 refills | Status: DC

## 2018-07-06 NOTE — Patient Instructions (Addendum)
[facility]  Call to schedule mammogram 01/19/19   [facility] for leg cramps, make sure drinking enough water  Leg Cramps Leg cramps occur when a muscle or muscles tighten and you have no control over this tightening (involuntary muscle contraction). Muscle cramps can develop in any muscle, but the most common place is in the calf muscles of the leg. Those cramps can occur during exercise or when you are at rest. Leg cramps are painful, and they may last for a few seconds to a few minutes. Cramps may return several times before they finally stop. Usually, leg cramps are not caused by a serious medical problem. In many cases, the cause is not known. Some common causes include:  Overexertion.  Overuse from repetitive motions, or doing the same thing over and over.  Remaining in a certain position for a long period of time.  Improper preparation, form, or technique while performing a sport or an activity.  Dehydration.  Injury.  Side effects of some medicines.  Abnormally low levels of the salts and ions in your blood (electrolytes), especially potassium and calcium. These levels could be low if you are taking water pills (diuretics) or if you are pregnant.  Follow these instructions at home: Watch your condition for any changes. Taking the following actions may help to lessen any discomfort that you are feeling:  Stay well-hydrated. Drink enough fluid to keep your urine clear or pale yellow.  Try massaging, stretching, and relaxing the affected muscle. Do this for several minutes at a time.  For tight or tense muscles, use a warm towel, heating pad, or hot shower water directed to the affected area.  If you are sore or have pain after a cramp, applying ice to the affected area may relieve discomfort. ? Put ice in a plastic bag. ? Place a towel between your skin and the bag. ? Leave the ice on for 20 minutes, 2-3 times per day.  Avoid strenuous exercise for  several days if you have been having frequent leg cramps.  Make sure that your diet includes the essential minerals for your muscles to work normally.  Take medicines only as directed by your health care provider.  Contact a health care provider if:  Your leg cramps get more severe or more frequent, or they do not improve over time.  Your foot becomes cold, numb, or blue. This information is not intended to replace advice given to you by your health care provider. Make sure you discuss any questions you have with your health care provider. Document Released: 09/25/2004 Document Revised: 01/24/2016 Document Reviewed: 07/26/2014 Elsevier Interactive Patient Education  2018 [employer].   Hemorrhoids Hemorrhoids are swollen veins in and around the rectum or anus. There are two types of hemorrhoids:  Internal hemorrhoids. These occur in the veins that are just inside the rectum. They may poke through to the outside and become irritated and painful.  External hemorrhoids. These occur in the veins that are outside of the anus and can be felt as a painful swelling or hard lump near the anus.  Most hemorrhoids do not cause serious problems, and they can be managed with home treatments such as diet and lifestyle changes. If home treatments do not help your symptoms, procedures can be done to shrink or remove the hemorrhoids. What are the causes? This condition is caused by increased pressure in the anal area. This pressure may result from various things, including:  Constipation.  Straining  to have a bowel movement.  Diarrhea.  Pregnancy.  Obesity.  Sitting for long periods of time.  Heavy lifting or other activity that causes you to strain.  Anal sex.  What are the signs or symptoms? Symptoms of this condition include:  Pain.  Anal itching or irritation.  Rectal bleeding.  Leakage of stool (feces).  Anal swelling.  One or more lumps around the anus.  How is this  diagnosed? This condition can often be diagnosed through a visual exam. Other exams or tests may also be done, such as:  Examination of the rectal area with a gloved hand (digital rectal exam).  Examination of the anal canal using a small tube (anoscope).  A blood test, if you have lost a significant amount of blood.  A test to look inside the colon (sigmoidoscopy or colonoscopy).  How is this treated? This condition can usually be treated at home. However, various procedures may be done if dietary changes, lifestyle changes, and other home treatments do not help your symptoms. These procedures can help make the hemorrhoids smaller or remove them completely. Some of these procedures involve surgery, and others do not. Common procedures include:  Rubber band ligation. Rubber bands are placed at the base of the hemorrhoids to cut off the blood supply to them.  Sclerotherapy. Medicine is injected into the hemorrhoids to shrink them.  Infrared coagulation. A type of light energy is used to get rid of the hemorrhoids.  Hemorrhoidectomy surgery. The hemorrhoids are surgically removed, and the veins that supply them are tied off.  Stapled hemorrhoidopexy surgery. A circular stapling device is used to remove the hemorrhoids and use staples to cut off the blood supply to them.  Follow these instructions at home: Eating and drinking  Eat foods that have a lot of fiber in them, such as whole grains, beans, nuts, fruits, and vegetables. Ask your health care provider about taking products that have added fiber (fiber supplements).  Drink enough fluid to keep your urine clear or pale yellow. Managing pain and swelling  Take warm sitz baths for 20 minutes, 3-4 times a day to ease pain and discomfort.  If directed, apply ice to the affected area. Using ice packs between sitz baths may be helpful. ? Put ice in a plastic bag. ? Place a towel between your skin and the bag. ? Leave the ice on for 20  minutes, 2-3 times a day. General instructions  Take over-the-counter and prescription medicines only as told by your health care provider.  Use medicated creams or suppositories as told.  Exercise regularly.  Go to the bathroom when you have the urge to have a bowel movement. Do not wait.  Avoid straining to have bowel movements.  Keep the anal area dry and clean. Use wet toilet paper or moist towelettes after a bowel movement.  Do not sit on the toilet for long periods of time. This increases blood pooling and pain. Contact a health care provider if:  You have increasing pain and swelling that are not controlled by treatment or medicine.  You have uncontrolled bleeding.  You have difficulty having a bowel movement, or you are unable to have a bowel movement.  You have pain or inflammation outside the area of the hemorrhoids. This information is not intended to replace advice given to you by your health care provider. Make sure you discuss any questions you have with your health care provider. Document Released: 08/15/2000 Document Revised: 01/16/2016 Document Reviewed: 05/02/2015 Elsevier  Interactive Patient Education  2018 Elsevier Inc.   shingrix vaccine/ Recombinant Zoster (Shingles) Vaccine, RZV: What You Need to Know 1. Why get vaccinated? Shingles (also called herpes zoster, or just zoster) is a painful skin rash, often with blisters. Shingles is caused by the varicella zoster virus, the same virus that causes chickenpox. After you have chickenpox, the virus stays in your body and can cause shingles later in life. You can't catch shingles from another person. However, a person who has never had chickenpox (or chickenpox vaccine) could get chickenpox from someone with shingles. A shingles rash usually appears on one side of the face or body and heals within 2 to 4 weeks. Its main symptom is pain, which can be severe. Other symptoms can include fever, headache, chills and  upset stomach. Very rarely, a shingles infection can lead to pneumonia, hearing problems, blindness, brain inflammation (encephalitis), or death. For about 1 person in 5, severe pain can continue even long after the rash has cleared up. This long-lasting pain is called post-herpetic neuralgia (PHN). Shingles is far more common in people 49 years of age and older than in younger people, and the risk increases with age. It is also more common in people whose immune system is weakened because of a disease such as cancer, or by drugs such as steroids or chemotherapy. At least 1 million people a year in the [place] get shingles. 2. Shingles vaccine (recombinant) Recombinant shingles vaccine was approved by FDA in 2017 for the prevention of shingles. In clinical trials, it was more than 90% effective in preventing shingles. It can also reduce the likelihood of PHN. Two doses, 2 to 6 months apart, are recommended for adults 50 and older. This vaccine is also recommended for people who have already gotten the live shingles vaccine (Zostavax). There is no live virus in this vaccine. 3. Some people should not get this vaccine Tell your vaccine provider if you:  Have any severe, life-threatening allergies. A person who has ever had a life-threatening allergic reaction after a dose of recombinant shingles vaccine, or has a severe allergy to any component of this vaccine, may be advised not to be vaccinated. Ask your health care provider if you want information about vaccine components.  Are pregnant or breastfeeding. There is not much information about use of recombinant shingles vaccine in pregnant or nursing women. Your healthcare provider might recommend delaying vaccination.  Are not feeling well. If you have a mild illness, such as a cold, you can probably get the vaccine today. If you are moderately or severely ill, you should probably wait until you recover. Your doctor can advise you.  4. Risks of  a vaccine reaction With any medicine, including vaccines, there is a chance of reactions. After recombinant shingles vaccination, a person might experience:  Pain, redness, soreness, or swelling at the site of the injection  Headache, muscle aches, fever, shivering, fatigue  In clinical trials, most people got a sore arm with mild or moderate pain after vaccination, and some also had redness and swelling where they got the shot. Some people felt tired, had muscle pain, a headache, shivering, fever, stomach pain, or nausea. About 1 out of 6 people who got recombinant zoster vaccine experienced side effects that prevented them from doing regular activities. Symptoms went away on their own in about 2 to 3 days. Side effects were more common in younger people. You should still get the second dose of recombinant zoster vaccine even if you  had one of these reactions after the first dose. Other things that could happen after this vaccine:  People sometimes faint after medical procedures, including vaccination. Sitting or lying down for about 15 minutes can help prevent fainting and injuries caused by a fall. Tell your provider if you feel dizzy or have vision changes or ringing in the ears.  Some people get shoulder pain that can be more severe and longer-lasting than routine soreness that can follow injections. This happens very rarely.  Any medication can cause a severe allergic reaction. Such reactions to a vaccine are estimated at about 1 in a million doses, and would happen within a few minutes to a few hours after the vaccination. As with any medicine, there is a very remote chance of a vaccine causing a serious injury or death. The safety of vaccines is always being monitored. For more information, visit: http://floyd.org/ 5. What if there is a serious problem? What should I look for?  Look for anything that concerns you, such as signs of a severe allergic reaction, very high fever, or  unusual behavior. Signs of a severe allergic reaction can include hives, swelling of the face and throat, difficulty breathing, a fast heartbeat, dizziness, and weakness. These would usually start a few minutes to a few hours after the vaccination. What should I do?  If you think it is a severe allergic reaction or other emergency that can't wait, call 9-1-1 and get to the nearest hospital. Otherwise, call your health care provider. Afterward, the reaction should be reported to the Vaccine Adverse Event Reporting System (VAERS). Your doctor should file this report, or you can do it yourself through the VAERS web site atwww.vaers.https://coleman.net/ by calling 604-537-2909. VAERS does not give medical advice. 6. How can I learn more?  Ask your healthcare provider. He or she can give you the vaccine package insert or suggest other sources of information.  Call your local or state health department.  Contact the Centers for Disease Control and Prevention (CDC): ? Call (916)609-0640 (1-800-CDC-INFO) or ? Visit the CDC's website at PicCapture.uy CDC Vaccine Information Statement (VIS) Recombinant Zoster Vaccine (10/13/2016) This information is not intended to replace advice given to you by your health care provider. Make sure you discuss any questions you have with your health care provider. Document Released: 10/28/2016 Document Revised: 10/28/2016 Document Reviewed: 10/28/2016 Elsevier Interactive Patient Education  [employer].

## 2018-07-06 NOTE — Progress Notes (Signed)
Chief Complaint  Patient presents with  . transfer of care    Discuss depression and Paxil. Pt has been taking Paxil for many years just not as effective anymore. Will need Epi pen refilled.    TOC 1. C/o depression PHQ 9 score today 16 on paxil 20 mg qd pt states not effective she has been on it many years and wants to increase the dose  -per pt depression is extreme and worse x 4-6 weeks  2. C/o arthritis pain h/o psoriatic arthritis per pt was on MTX but now on tramadol 1-3 x per day she does not have rheumatologist. 3. C/o large breasts causing neck pain b/l shoulder pain and low back pain went for breast reduction but rec support bras and PT. Went to [facility] plastic surgery and saw Dr. Dimas Aguas who is who she would want to do her breast reduction in the future if approved by insurance  4. Wants refill of epipen  5. C/o epigastric pain causes her to curl over nexium does not help pain chronic x 2 years. C/o loose stools and hemorrhoids will refer to GI further w/u  6. C/o leg cramps b/l legs   Review of Systems  Constitutional: Negative for weight loss.  HENT: Negative for hearing loss.   Eyes: Negative for blurred vision.  Respiratory: Negative for shortness of breath.   Cardiovascular: Negative for chest pain.  Gastrointestinal: Negative for abdominal pain.  Musculoskeletal: Positive for back pain, joint pain and neck pain.  Skin: Negative for rash.  Neurological: Negative for headaches.  Psychiatric/Behavioral: Positive for depression.   Past Medical History:  Diagnosis Date  . Allergy   . Chronic pain   . Chronic sinusitis   . Depression   . HPV (human papilloma virus) anogenital infection   . Migraine   . Psoriatic arthritis [facility])    Past Surgical History:  Procedure Laterality Date  . BREAST BIOPSY Right 01/28/2018   US guided biopsy - heart shaped  . CHOLECYSTECTOMY  2009  . GASTRIC BYPASS     2015  . LAPAROSCOPIC GASTRIC BANDING  2008    removed 2009  . TUBAL  LIGATION  1997   Family History  Problem Relation Age of Onset  . HIV Father   . Hypertension Mother   . Hyperlipidemia Mother   . Lung cancer Maternal Grandfather        smoker  . Hepatitis Maternal Uncle        drug use  . Colon cancer Neg Hx   . Colon polyps Neg Hx   . Rectal cancer Neg Hx   . Stomach cancer Neg Hx   . Breast cancer Neg Hx    Social History   Socioeconomic History  . Marital status: Married    Spouse name: Not on file  . Number of children: 2  . Years of education: Not on file  . Highest education level: Not on file  Occupational History  . Occupation: Forensic scientist, Publishing rights manager: [employer]  Social Needs  . Financial resource strain: Not on file  . Food insecurity:    Worry: Not on file    Inability: Not on file  . Transportation needs:    Medical: Not on file    Non-medical: Not on file  Tobacco Use  . Smoking status: Former Smoker    Packs/day: 2.00    Years: 20.00    Pack years: 40.00    Types: Cigarettes    Last  attempt to quit: 09/02/2003    Years since quitting: 14.8  . Smokeless tobacco: Never Used  Substance and Sexual Activity  . Alcohol use: No    Alcohol/week: 0.0 standard drinks  . Drug use: No  . Sexual activity: Not on file  Lifestyle  . Physical activity:    Days per week: Not on file    Minutes per session: Not on file  . Stress: Not on file  Relationships  . Social connections:    Talks on phone: Not on file    Gets together: Not on file    Attends religious service: Not on file    Active member of club or organization: Not on file    Attends meetings of clubs or organizations: Not on file    Relationship status: Not on file  . Intimate partner violence:    Fear of current or ex partner: Not on file    Emotionally abused: Not on file    Physically abused: Not on file    Forced sexual activity: Not on file  Other Topics Concern  . Not on file  Social History Narrative   Married.   1 child, 5  grandchildren.   Works for the post [employer].   Enjoys reading and exercising.    Current Meds  Medication Sig  . Multiple Vitamin (MULTIVITAMIN) tablet Take 1 tablet by mouth daily.  [name] PARoxetine (PAXIL) 40 MG tablet TAKE 1 TABLET(40 MG) BY MOUTH DAILY  . traMADol (ULTRAM) 50 MG tablet One tablet daily for shoulder pain  . [DISCONTINUED] amoxicillin-clavulanate (AUGMENTIN) 875-125 MG tablet Take 1 tablet by mouth 2 (two) times daily.  . [DISCONTINUED] Calcium-Magnesium-Zinc 409-81-1 MG TABS Take 1 tablet by mouth.  . [DISCONTINUED] Cholecalciferol (VITAMIN D) 2000 UNITS CAPS Take 3 capsules by mouth daily.  . [DISCONTINUED] EPINEPHrine (EPIPEN 2-PAK) 0.3 mg/0.3 mL DEVI Inject as directed and then go to ER   . [DISCONTINUED] PARoxetine (PAXIL) 20 MG tablet TAKE 1 TABLET(20 MG) BY MOUTH DAILY  . [DISCONTINUED] predniSONE (DELTASONE) 10 MG tablet 6 tablets on Day 1 , then reduce by 1 tablet daily until gone   Allergies  Allergen Reactions  . Shellfish Allergy Swelling    REACTION: throat closing  . Hydrocod Polst-Cpm Polst Er Other (See Comments)    Bad dreams   No results found for this or any previous visit (from the past 2160 hour(s)). Objective  Body mass index is 28.63 kg/m. Wt Readings from Last 3 Encounters:  07/06/18 161 lb 9.6 oz (73.3 kg)  06/14/18 166 lb 12.8 oz (75.7 kg)  05/27/18 163 lb 6.4 oz (74.1 kg)   Temp Readings from Last 3 Encounters:  07/06/18 97.8 F (36.6 C) (Oral)  06/14/18 98 F (36.7 C) (Oral)  05/27/18 98 F (36.7 C) (Oral)   BP Readings from Last 3 Encounters:  07/06/18 116/80  06/14/18 118/74  05/27/18 138/88   Pulse Readings from Last 3 Encounters:  07/06/18 86  06/14/18 88  05/27/18 75    Physical Exam  Constitutional: She is oriented to person, place, and time. Vital signs are normal. She appears well-developed and well-nourished. She is cooperative.  HENT:  Head: Normocephalic and atraumatic.  Mouth/Throat: Oropharynx is clear and  moist and mucous membranes are normal.  Eyes: Pupils are equal, round, and reactive to light. Conjunctivae are normal.  Cardiovascular: Normal rate and regular rhythm.  Murmur heard. Pulmonary/Chest: Effort normal and breath sounds normal.  Neurological: She is alert and oriented to person, place,  and time. Gait normal.  Skin: Skin is warm, dry and intact.  Psychiatric: She has a normal mood and affect. Her speech is normal and behavior is normal. Judgment and thought content normal. Cognition and memory are normal.  Nursing note and vitals reviewed.   Assessment   1. Depression>anxiety moderate phq 9 score 16 today  2. Arthritis h/o psoriatic arthritis vs other  3. Large breasts causing b/l neck pain, shoulder pain and low back pain  4. Epigastric pain, loose stools, hemorrhoids  5. Leg cramps  6. HM Plan   1. Increase paxil to 40 consider add wellbutrin in future  Referred to [facility] 2. Refer to Dr. Phillips Hay in [place]  3. Disc PT Pt wants breast reduction with Dr. Helaine Chess plastic surgery already tried supportive bras w/o help  4. Referred to GI  5. Check labs  6.  Had flu shot per pt  Tdap utd  Consider shingrix in future   Pap at f/u  Referred mammogram  Referred to GI colonoscopy EGD see #4  sch fasting labs  Former smoker quit 15 years ago smoked 10-13 years 1ppd    Provider: Dr. French Ana McLean-Scocuzza-Internal Medicine

## 2018-07-07 ENCOUNTER — Other Ambulatory Visit: Payer: Self-pay

## 2018-07-07 ENCOUNTER — Encounter: Payer: Self-pay | Admitting: Gastroenterology

## 2018-07-07 ENCOUNTER — Ambulatory Visit
Admission: RE | Admit: 2018-07-07 | Discharge: 2018-07-07 | Disposition: A | Discharge status: Home / Self Care | Payer: Federal, State, Local not specified - PPO | Source: Ambulatory Visit | Attending: Sports Medicine | Admitting: Sports Medicine

## 2018-07-07 ENCOUNTER — Ambulatory Visit: Payer: Federal, State, Local not specified - PPO | Admitting: Gastroenterology

## 2018-07-07 VITALS — BP 119/82 | HR 87 | Resp 17 | Ht 63.0 in | Wt 162.2 lb

## 2018-07-07 DIAGNOSIS — M25511 Pain in right shoulder: Secondary | ICD-10-CM | POA: Diagnosis not present

## 2018-07-07 DIAGNOSIS — K640 First degree hemorrhoids: Secondary | ICD-10-CM

## 2018-07-07 DIAGNOSIS — M7501 Adhesive capsulitis of right shoulder: Secondary | ICD-10-CM | POA: Diagnosis not present

## 2018-07-07 DIAGNOSIS — M19011 Primary osteoarthritis, right shoulder: Secondary | ICD-10-CM | POA: Insufficient documentation

## 2018-07-07 DIAGNOSIS — G8929 Other chronic pain: Secondary | ICD-10-CM

## 2018-07-07 DIAGNOSIS — Z1211 Encounter for screening for malignant neoplasm of colon: Secondary | ICD-10-CM

## 2018-07-07 DIAGNOSIS — M25811 Other specified joint disorders, right shoulder: Secondary | ICD-10-CM | POA: Insufficient documentation

## 2018-07-07 DIAGNOSIS — M19211 Secondary osteoarthritis, right shoulder: Secondary | ICD-10-CM | POA: Insufficient documentation

## 2018-07-07 DIAGNOSIS — R1013 Epigastric pain: Secondary | ICD-10-CM | POA: Diagnosis not present

## 2018-07-07 DIAGNOSIS — M7531 Calcific tendinitis of right shoulder: Secondary | ICD-10-CM | POA: Diagnosis not present

## 2018-07-07 NOTE — Progress Notes (Signed)
Arlyss Repress, MD [address]  [address]  [place], [place] [address]  Main: [phone]  Fax: [phone]    Gastroenterology Consultation  Referring Provider:     McLean-Scocuzza, French Ana * Primary Care Physician:  McLean-Scocuzza, Pasty Spillers, MD Primary Gastroenterologist:  Dr. Arlyss Repress Reason for Consultation:     Epigastric pain, symptomatic hemorrhoids, colon cancer screening        HPI:   Barbara Faulkner is a 52 y.o. female referred by Dr. Judie Grieve, Pasty Spillers, MD  for consultation & management of symptomatic hemorrhoids, altered bowel habits, chronic epigastric pain.  Patient had a history of duodenal switch in 01/2014 and lost about 100 pounds  Chronic epigastric pain: Intermittent, sharp in nature, lasts about 3 minutes, sporadic, unpredictable, not associated with nausea or vomiting or abdominal bloating.  Severe intensity.  Occurs about few times a month.  No aggravating or relieving factors.  Patient used to take PPI in the past  Altered bowel habits: Patient had duodenal switch in 01/2014.  Since then, she has been having an obstructive diarrhea, in the beginning, she was having 15 episodes per day, currently having about 6/day.  He about once or twice a month, she has severe constipation associated with hard stool, significant straining.  She uses MiraLAX and suppository which relieves the constipation.  Symptomatic hemorrhoids: She has been experiencing pain, pressure, itching and irritation since her last pregnancy which was 35 years ago.  Currently, her symptoms are manageable but she is interested to undergo hemorrhoid ligation Patient works in the postal office She denies smoking or alcohol use  NSAIDs: none  Antiplts/Anticoagulants/Anti thrombotics: none  GI Procedures: EGD 2013, unremarkable Did not undergo colonoscopy Denies family history of GI malignancy  Past Medical History:  Diagnosis Date  . Allergy   . Chronic pain   . Chronic sinusitis    . Depression   . HPV (human papilloma virus) anogenital infection   . Migraine   . Psoriatic arthritis [facility])     Past Surgical History:  Procedure Laterality Date  . BREAST BIOPSY Right 01/28/2018   US guided biopsy - heart shaped  . CHOLECYSTECTOMY  2009  . GASTRIC BYPASS     2015  . LAPAROSCOPIC GASTRIC BANDING  2008    removed 2009  . TUBAL LIGATION  1997     Current Outpatient Medications:  [name]  Multiple Vitamin (MULTIVITAMIN) tablet, Take 1 tablet by mouth daily., Disp: , Rfl:  .  PARoxetine (PAXIL) 40 MG tablet, TAKE 1 TABLET(40 MG) BY MOUTH DAILY, Disp: 90 tablet, Rfl: 1 .  traMADol (ULTRAM) 50 MG tablet, One tablet daily for shoulder pain, Disp: 30 tablet, Rfl: 0 .  Cholecalciferol (VITAMIN D3) 50 MCG (2000 UT) capsule, Take by mouth., Disp: , Rfl:   Family History  Problem Relation Age of Onset  . HIV Father   . Hypertension Mother   . Hyperlipidemia Mother   . Lung cancer Maternal Grandfather        smoker  . Hepatitis Maternal Uncle        drug use  . Colon cancer Neg Hx   . Colon polyps Neg Hx   . Rectal cancer Neg Hx   . Stomach cancer Neg Hx   . Breast cancer Neg Hx      Social History   Tobacco Use  . Smoking status: Former Smoker    Packs/day: 2.00    Years: 20.00    Pack years: 40.00  Types: Cigarettes    Last attempt to quit: 09/02/2003    Years since quitting: 14.8  . Smokeless tobacco: Never Used  Substance Use Topics  . Alcohol use: No    Alcohol/week: 0.0 standard drinks  . Drug use: No    Allergies as of 07/07/2018 - Review Complete 07/07/2018  Allergen Reaction Noted  . Shellfish allergy Swelling 09/27/2010  . Hydrocod polst-cpm polst er Other (See Comments) 09/18/2011    Review of Systems:    All systems reviewed and negative except where noted in HPI.   Physical Exam:  BP 119/82 (BP Location: Left Arm, Patient Position: Sitting, Cuff Size: Normal)   Pulse 87   Resp 17   Ht 5\' 3"  (1.6 m)   Wt 162 lb 3.2 oz (73.6 kg)    LMP 05/04/2014   BMI 28.73 kg/m  Patient's last menstrual period was 05/04/2014.  General:   Alert,  Well-developed, well-nourished, pleasant and cooperative in NAD Head:  Normocephalic and atraumatic. Eyes:  Sclera clear, no icterus.   Conjunctiva pink. Ears:  Normal auditory acuity. Nose:  No deformity, discharge, or lesions. Mouth:  No deformity or lesions,oropharynx pink & moist. Neck:  Supple; no masses or thyromegaly. Lungs:  Respirations even and unlabored.  Clear throughout to auscultation.   No wheezes, crackles, or rhonchi. No acute distress. Heart:  Regular rate and rhythm; no murmurs, clicks, rubs, or gallops. Abdomen:  Normal bowel sounds. Soft, non-tender and non-distended without masses, hepatosplenomegaly or hernias noted.  No guarding or rebound tenderness.   Rectal: Not performed Msk:  Symmetrical without gross deformities. Good, equal movement & strength bilaterally. Pulses:  Normal pulses noted. Extremities:  No clubbing or edema.  No cyanosis. Neurologic:  Alert and oriented x3;  grossly normal neurologically. Skin:  Intact without significant lesions or rashes. No jaundice. Lymph Nodes:  No significant cervical adenopathy. Psych:  Alert and cooperative. Normal mood and affect.  Imaging Studies: No recent abdominal imaging  Assessment and Plan:   Barbara Faulkner is a 52 y.o. African-American female with history of morbid obesity status post duodenal switch in 01/2014, status post cholecystectomy seen in consultation for chronic intermittent sharp epigastric pain, symptomatic hemorrhoids, intermittent episodes of constipation, also due for colon cancer screening  Epigastric pain EGD with biopsies and rule out any anastomotic ulcer  Intermittent constipation Currently managed with MiraLAX and suppository We will try Linzess 145 MCG  Symptomatic hemorrhoids Discussed with her about hemorrhoid ligation Will perform this after her colonoscopy  Colon cancer  screening Schedule colonoscopy   Follow up in 6 weeks   Arlyss Repress, MD

## 2018-07-08 ENCOUNTER — Encounter: Payer: Self-pay | Admitting: Internal Medicine

## 2018-07-08 DIAGNOSIS — R1013 Epigastric pain: Secondary | ICD-10-CM | POA: Insufficient documentation

## 2018-07-14 ENCOUNTER — Other Ambulatory Visit (INDEPENDENT_AMBULATORY_CARE_PROVIDER_SITE_OTHER): Payer: Federal, State, Local not specified - PPO

## 2018-07-14 DIAGNOSIS — E876 Hypokalemia: Secondary | ICD-10-CM | POA: Diagnosis not present

## 2018-07-14 DIAGNOSIS — Z1329 Encounter for screening for other suspected endocrine disorder: Secondary | ICD-10-CM

## 2018-07-14 DIAGNOSIS — E559 Vitamin D deficiency, unspecified: Secondary | ICD-10-CM | POA: Diagnosis not present

## 2018-07-14 DIAGNOSIS — R1013 Epigastric pain: Secondary | ICD-10-CM | POA: Diagnosis not present

## 2018-07-14 DIAGNOSIS — Z13220 Encounter for screening for lipoid disorders: Secondary | ICD-10-CM | POA: Diagnosis not present

## 2018-07-14 DIAGNOSIS — D509 Iron deficiency anemia, unspecified: Secondary | ICD-10-CM

## 2018-07-14 DIAGNOSIS — K080 Exfoliation of teeth due to systemic causes: Secondary | ICD-10-CM | POA: Diagnosis not present

## 2018-07-14 DIAGNOSIS — E538 Deficiency of other specified B group vitamins: Secondary | ICD-10-CM

## 2018-07-14 NOTE — Addendum Note (Signed)
Addended by: WIGGINS, Tedrick [place] N on: 07/14/2018 08:01 AM   Modules accepted: Orders  

## 2018-07-14 NOTE — Addendum Note (Signed)
Addended by: Penne LashWIGGINS, Demitrios Molyneux N on: 07/14/2018 08:01 AM   Modules accepted: Orders

## 2018-07-15 LAB — COMPREHENSIVE METABOLIC PANEL
AG Ratio: 1.9 (calc) (ref 1.0–2.5)
ALBUMIN MSPROF: 3.9 g/dL (ref 3.6–5.1)
ALKALINE PHOSPHATASE (APISO): 181 U/L — AB (ref 33–130)
ALT: 64 U/L — ABNORMAL HIGH (ref 6–29)
AST: 74 U/L — ABNORMAL HIGH (ref 10–35)
BILIRUBIN TOTAL: 0.7 mg/dL (ref 0.2–1.2)
BUN: 13 mg/dL (ref 7–25)
CALCIUM: 8.3 mg/dL — AB (ref 8.6–10.4)
CHLORIDE: 112 mmol/L — AB (ref 98–110)
CO2: 23 mmol/L (ref 20–32)
Creat: 0.86 mg/dL (ref 0.50–1.05)
GLOBULIN: 2.1 g/dL (ref 1.9–3.7)
Glucose, Bld: 86 mg/dL (ref 65–99)
POTASSIUM: 4.3 mmol/L (ref 3.5–5.3)
Sodium: 144 mmol/L (ref 135–146)
Total Protein: 6 g/dL — ABNORMAL LOW (ref 6.1–8.1)

## 2018-07-15 LAB — IRON,TIBC AND FERRITIN PANEL
%SAT: 13 % (calc) — ABNORMAL LOW (ref 16–45)
Ferritin: 9 ng/mL — ABNORMAL LOW (ref 16–232)
Iron: 56 ug/dL (ref 45–160)
TIBC: 416 mcg/dL (calc) (ref 250–450)

## 2018-07-15 LAB — URINALYSIS, ROUTINE W REFLEX MICROSCOPIC
BILIRUBIN UA: NEGATIVE
Glucose, UA: NEGATIVE
Ketones, UA: NEGATIVE
LEUKOCYTES UA: NEGATIVE
Nitrite, UA: NEGATIVE
PH UA: 5.5 (ref 5.0–7.5)
Protein, UA: NEGATIVE
RBC UA: NEGATIVE
SPEC GRAV UA: 1.022 (ref 1.005–1.030)
UUROB: 0.2 mg/dL (ref 0.2–1.0)

## 2018-07-15 LAB — CBC WITH DIFFERENTIAL/PLATELET
Basophils Absolute: 24 cells/uL (ref 0–200)
Basophils Relative: 0.3 %
EOS PCT: 2.5 %
Eosinophils Absolute: 198 cells/uL (ref 15–500)
HCT: 34.4 % — ABNORMAL LOW (ref 35.0–45.0)
Hemoglobin: 10.8 g/dL — ABNORMAL LOW (ref 11.7–15.5)
Lymphs Abs: 2173 cells/uL (ref 850–3900)
MCH: 25.7 pg — ABNORMAL LOW (ref 27.0–33.0)
MCHC: 31.4 g/dL — ABNORMAL LOW (ref 32.0–36.0)
MCV: 81.9 fL (ref 80.0–100.0)
Monocytes Relative: 6.7 %
NEUTROS PCT: 63 %
Neutro Abs: 4977 cells/uL (ref 1500–7800)
PLATELETS: 156 10*3/uL (ref 140–400)
RBC: 4.2 10*6/uL (ref 3.80–5.10)
RDW: 13.3 % (ref 11.0–15.0)
TOTAL LYMPHOCYTE: 27.5 %
WBC mixed population: 529 cells/uL (ref 200–950)
WBC: 7.9 10*3/uL (ref 3.8–10.8)

## 2018-07-15 LAB — LIPID PANEL
Cholesterol: 104 mg/dL (ref <200)
HDL: 34 mg/dL — AB (ref >50)
LDL Cholesterol (Calc): 59 mg/dL (calc)
Non-HDL Cholesterol (Calc): 70 mg/dL (calc) (ref <130)
TRIGLYCERIDES: 40 mg/dL (ref <150)
Total CHOL/HDL Ratio: 3.1 (calc) (ref <5.0)

## 2018-07-15 LAB — TSH: TSH: 0.94 mIU/L

## 2018-07-15 LAB — VITAMIN D 25 HYDROXY (VIT D DEFICIENCY, FRACTURES): Vit D, 25-Hydroxy: 10 ng/mL — ABNORMAL LOW (ref 30–100)

## 2018-07-15 LAB — VITAMIN B12: Vitamin B-12: 612 pg/mL (ref 200–1100)

## 2018-07-15 LAB — MAGNESIUM: MAGNESIUM: 1.9 mg/dL (ref 1.5–2.5)

## 2018-07-16 ENCOUNTER — Other Ambulatory Visit: Payer: Self-pay | Admitting: Internal Medicine

## 2018-07-16 DIAGNOSIS — G894 Chronic pain syndrome: Secondary | ICD-10-CM

## 2018-07-16 DIAGNOSIS — L4050 Arthropathic psoriasis, unspecified: Secondary | ICD-10-CM

## 2018-07-19 ENCOUNTER — Ambulatory Visit: Payer: Federal, State, Local not specified - PPO | Admitting: Anesthesiology

## 2018-07-19 ENCOUNTER — Ambulatory Visit
Admission: RE | Admit: 2018-07-19 | Discharge: 2018-07-19 | Disposition: A | Discharge status: Home / Self Care | Payer: Federal, State, Local not specified - PPO | Source: Ambulatory Visit | Attending: Gastroenterology | Admitting: Gastroenterology

## 2018-07-19 ENCOUNTER — Other Ambulatory Visit: Payer: Self-pay | Admitting: Internal Medicine

## 2018-07-19 ENCOUNTER — Other Ambulatory Visit: Payer: Self-pay | Admitting: Gastroenterology

## 2018-07-19 ENCOUNTER — Encounter
Admission: RE | Disposition: A | Discharge status: Home / Self Care | Payer: Self-pay | Source: Ambulatory Visit | Attending: Gastroenterology

## 2018-07-19 ENCOUNTER — Telehealth: Payer: Self-pay | Admitting: Internal Medicine

## 2018-07-19 ENCOUNTER — Encounter: Payer: Self-pay | Admitting: *Deleted

## 2018-07-19 DIAGNOSIS — Z1211 Encounter for screening for malignant neoplasm of colon: Secondary | ICD-10-CM | POA: Diagnosis not present

## 2018-07-19 DIAGNOSIS — K649 Unspecified hemorrhoids: Secondary | ICD-10-CM | POA: Diagnosis not present

## 2018-07-19 DIAGNOSIS — Z87891 Personal history of nicotine dependence: Secondary | ICD-10-CM | POA: Diagnosis not present

## 2018-07-19 DIAGNOSIS — K2960 Other gastritis without bleeding: Secondary | ICD-10-CM | POA: Diagnosis not present

## 2018-07-19 DIAGNOSIS — K644 Residual hemorrhoidal skin tags: Secondary | ICD-10-CM | POA: Diagnosis not present

## 2018-07-19 DIAGNOSIS — G8929 Other chronic pain: Secondary | ICD-10-CM | POA: Diagnosis not present

## 2018-07-19 DIAGNOSIS — Z9884 Bariatric surgery status: Secondary | ICD-10-CM

## 2018-07-19 DIAGNOSIS — K449 Diaphragmatic hernia without obstruction or gangrene: Secondary | ICD-10-CM | POA: Insufficient documentation

## 2018-07-19 DIAGNOSIS — K259 Gastric ulcer, unspecified as acute or chronic, without hemorrhage or perforation: Secondary | ICD-10-CM | POA: Diagnosis not present

## 2018-07-19 DIAGNOSIS — R1013 Epigastric pain: Secondary | ICD-10-CM | POA: Diagnosis not present

## 2018-07-19 DIAGNOSIS — K2970 Gastritis, unspecified, without bleeding: Secondary | ICD-10-CM | POA: Diagnosis not present

## 2018-07-19 DIAGNOSIS — E559 Vitamin D deficiency, unspecified: Secondary | ICD-10-CM

## 2018-07-19 DIAGNOSIS — R748 Abnormal levels of other serum enzymes: Secondary | ICD-10-CM

## 2018-07-19 HISTORY — PX: ESOPHAGOGASTRODUODENOSCOPY (EGD) WITH PROPOFOL: SHX5813

## 2018-07-19 HISTORY — PX: COLONOSCOPY WITH PROPOFOL: SHX5780

## 2018-07-19 SURGERY — COLONOSCOPY WITH PROPOFOL
Anesthesia: General

## 2018-07-19 MED ORDER — PROPOFOL 500 MG/50ML IV EMUL
INTRAVENOUS | Status: DC | PRN
  Administered 2018-07-19: 175 ug/kg/min via INTRAVENOUS

## 2018-07-19 MED ORDER — CHOLESTYRAMINE 4 G PO PACK: 4.0000 g | PACK | Freq: Two times a day (BID) | ORAL | 0 refills | Status: DC

## 2018-07-19 MED ORDER — PROPOFOL 10 MG/ML IV BOLUS
INTRAVENOUS | Status: DC | PRN
  Administered 2018-07-19: 80 mg via INTRAVENOUS
  Administered 2018-07-19: 20 mg via INTRAVENOUS

## 2018-07-19 MED ORDER — PHENYLEPHRINE HCL 10 MG/ML IJ SOLN
INTRAMUSCULAR | Status: DC | PRN
  Administered 2018-07-19 (×3): 100 ug via INTRAVENOUS

## 2018-07-19 MED ORDER — LIDOCAINE HCL (PF) 2 % IJ SOLN
INTRAMUSCULAR | Status: DC | PRN
  Administered 2018-07-19: 100 mg via INTRADERMAL

## 2018-07-19 MED ORDER — SODIUM CHLORIDE 0.9 % IV SOLN
INTRAVENOUS | Status: DC
  Administered 2018-07-19: 1000 mL via INTRAVENOUS

## 2018-07-19 MED ORDER — CHOLECALCIFEROL 1.25 MG (50000 UT) PO CAPS: 50000.0000 [IU] | ORAL_CAPSULE | ORAL | 1 refills | Status: DC

## 2018-07-19 MED ORDER — LIDOCAINE HCL (PF) 1 % IJ SOLN
INTRAMUSCULAR | Status: AC
  Administered 2018-07-19: 0.3 mL
  Filled 2018-07-19: qty 2

## 2018-07-19 MED ORDER — OMEPRAZOLE 40 MG PO CPDR: 40.0000 mg | DELAYED_RELEASE_CAPSULE | Freq: Two times a day (BID) | ORAL | 0 refills | Status: DC

## 2018-07-19 NOTE — H&P (Signed)
Arlyss Repress, MD [address]  [address]  [place], [place] [address]  Main: [phone]  Fax: [phone] Pager: [phone]  Primary Care Physician:  McLean-Scocuzza, Pasty Spillers, MD Primary Gastroenterologist:  Dr. Arlyss Repress  Pre-Procedure History & Physical: HPI:  ILLA ENLOW is a 52 y.o. female is here for an endoscopy and colonoscopy.   Past Medical History:  Diagnosis Date  . Allergy   . Chronic pain   . Chronic sinusitis   . Depression   . Heart murmur   . HPV (human papilloma virus) anogenital infection   . Migraine   . Psoriatic arthritis [facility])     Past Surgical History:  Procedure Laterality Date  . BREAST BIOPSY Right 01/28/2018   US guided biopsy - heart shaped  . CHOLECYSTECTOMY  2009  . GASTRIC BYPASS     2015; duodenal switch   . LAPAROSCOPIC GASTRIC BANDING  2008    removed 2009  . TUBAL LIGATION  1997    Prior to Admission medications   Medication Sig Start Date End Date Taking? Authorizing Provider  Cholecalciferol (VITAMIN D3) 50 MCG (2000 UT) capsule Take by mouth.   Yes [provider]  Multiple Vitamin (MULTIVITAMIN) tablet Take 1 tablet by mouth daily.   Yes [provider]  PARoxetine (PAXIL) 40 MG tablet TAKE 1 TABLET(40 MG) BY MOUTH DAILY 07/06/18  Yes McLean-Scocuzza, Pasty Spillers, MD  traMADol Janean Sark) 50 MG tablet One tablet daily for shoulder pain 06/14/18  Yes Sherlene Shams, MD    Allergies as of 07/08/2018 - Review Complete 07/08/2018  Allergen Reaction Noted  . Shellfish allergy Swelling 09/27/2010  . Hydrocod polst-cpm polst er Other (See Comments) 09/18/2011    Family History  Problem Relation Age of Onset  . HIV Father   . Hypertension Mother   . Hyperlipidemia Mother   . Lung cancer Maternal Grandfather        smoker  . Hepatitis Maternal Uncle        drug use  . Colon cancer Neg Hx   . Colon polyps Neg Hx   . Rectal cancer Neg Hx   . Stomach cancer Neg Hx   . Breast cancer Neg Hx      Social History   Socioeconomic History  . Marital status: Married    Spouse name: Not on file  . Number of children: 2  . Years of education: Not on file  . Highest education level: Not on file  Occupational History  . Occupation: Forensic scientist, Publishing rights manager: [employer]  Social Needs  . Financial resource strain: Not on file  . Food insecurity:    Worry: Not on file    Inability: Not on file  . Transportation needs:    Medical: Not on file    Non-medical: Not on file  Tobacco Use  . Smoking status: Former Smoker    Packs/day: 2.00    Years: 20.00    Pack years: 40.00    Types: Cigarettes    Last attempt to quit: 09/02/2003    Years since quitting: 14.8  . Smokeless tobacco: Never Used  Substance and Sexual Activity  . Alcohol use: No    Alcohol/week: 0.0 standard drinks  . Drug use: No  . Sexual activity: Not on file  Lifestyle  . Physical activity:    Days per week: Not on file    Minutes per session: Not on file  . Stress: Not on file  Relationships  . Social connections:    Talks on phone: Not on file    Gets together: Not on file    Attends religious service: Not on file    Active member of club or organization: Not on file    Attends meetings of clubs or organizations: Not on file    Relationship status: Not on file  . Intimate partner violence:    Fear of current or ex partner: Not on file    Emotionally abused: Not on file    Physically abused: Not on file    Forced sexual activity: Not on file  Other Topics Concern  . Not on file  Social History Narrative   Married.   1 child, 6 grandchildren. 1 son did 2005   Works for the post office.   Enjoys reading and exercising.     Review of Systems: See HPI, otherwise negative ROS  Physical Exam: BP 110/75   Pulse 74   Temp (!) 96.4 F (35.8 C) (Tympanic)   Resp 18   Ht 5\' 3"  (1.6 m)   Wt 70.3 kg   LMP 05/04/2014   SpO2 98%   BMI 27.46 kg/m  General:   Alert,  pleasant and  cooperative in NAD Head:  Normocephalic and atraumatic. Neck:  Supple; no masses or thyromegaly. Lungs:  Clear throughout to auscultation.    Heart:  Regular rate and rhythm. Abdomen:  Soft, nontender and nondistended. Normal bowel sounds, without guarding, and without rebound.   Neurologic:  Alert and  oriented x4;  grossly normal neurologically.  Impression/Plan: Elroy ChannelLisa E Silverthorn is here for an endoscopy and colonoscopy to be performed for epigastric pain, colon cancer screening  Risks, benefits, limitations, and alternatives regarding  endoscopy and colonoscopy have been reviewed with the patient.  Questions have been answered.  All parties agreeable.   Lannette Donathohini Landers Prajapati, MD  07/19/2018, 11:11 AM

## 2018-07-19 NOTE — Op Note (Signed)
[facility]astroenter[facility] Patient Name: Barbara Faulkner Procedure Date: 07/19/2018 12:22 PM MRN: [id number] Account #: [id number] Date of Birth: [birth year] Admit Type: Outpatient Age: 52 Room: [facility] ENDO ROOM 2 Gender: Female Note Status: Finalized Procedure:            Upper GI endoscopy Indications:          Epigastric abdominal pain Providers:            Lin Landsman MD, MD Medicines:            Monitored Anesthesia Care Complications:        No immediate complications. Estimated blood loss:                        Minimal. Procedure:            Pre-Anesthesia Assessment:                       - Prior to the procedure, a History and Physical was                        performed, and patient medications and allergies were                        reviewed. The patient is competent. The risks and                        benefits of the procedure and the sedation options and                        risks were discussed with the patient. All questions                        were answered and informed consent was obtained.                        Patient identification and proposed procedure were                        verified by the physician, the nurse, the                        anesthesiologist, the anesthetist and the technician in                        the pre-procedure area in the procedure room in the                        endoscopy suite. Mental Status Examination: alert and                        oriented. Airway Examination: normal oropharyngeal                        airway and neck mobility. Respiratory Examination:                        clear to auscultation. CV Examination: normal.                        Prophylactic Antibiotics: The patient  does not require                        prophylactic antibiotics. Prior Anticoagulants: The                        patient has taken no previous anticoagulant or                        antiplatelet agents. ASA  Grade Assessment: II - A                        patient with mild systemic disease. After reviewing the                        risks and benefits, the patient was deemed in                        satisfactory condition to undergo the procedure. The                        anesthesia plan was to use monitored anesthesia care                        (MAC). Immediately prior to administration of                        medications, the patient was re-assessed for adequacy                        to receive sedatives. The heart rate, respiratory rate,                        oxygen saturations, blood pressure, adequacy of                        pulmonary ventilation, and response to care were                        monitored throughout the procedure. The physical status                        of the patient was re-assessed after the procedure.                       After obtaining informed consent, the endoscope was                        passed under direct vision. Throughout the procedure,                        the patient's blood pressure, pulse, and oxygen                        saturations were monitored continuously. The Endoscope                        was introduced through the mouth, and advanced to the                        afferent  and efferent jejunal loops. The upper GI                        endoscopy was accomplished without difficulty. The                        patient tolerated the procedure fairly well. Findings:      Evidence of a Roux-en-Y gastrojejunostomy was found. The gastrojejunal       anastomosis was characterized by healthy appearing mucosa. This was       traversed. The pouch-to-jejunum limb was characterized by erosions,       erythema, friable mucosa, a hemorrhagic appearance and an intact staple       line. The jejunojejunal anastomosis was characterized by healthy       appearing mucosa. The duodenum-to-jejunum limb was not examined as it       could not be  traversed. Biopsies were taken from stomach with a cold       forceps for Helicobacter pylori testing. Visible bile reflux into       gastric pouch      Esophagogastric landmarks were identified: the gastroesophageal junction       was found at 35 cm from the incisors.      The gastroesophageal junction and examined esophagus were normal.      A small hiatal hernia was present. Impression:           - Roux-en-Y gastrojejunostomy with gastrojejunal                        anastomosis characterized by healthy appearing mucosa.                       - Erosive gastritis of the pouch, bile reflux, Biopsied.                       - Esophagogastric landmarks identified.                       - Normal gastroesophageal junction and esophagus. Recommendation:       - Await pathology results.                       - Follow an antireflux regimen.                       - Use Prilosec (omeprazole) 40 mg PO BID.                       - Proceed with colonoscopy as scheduled                       See colonoscopy report Dr. Milford Cage MD, MD 07/19/2018 12:43:15 PM This report has been signed electronically. Number of Addenda: 0 Note Initiated On: 07/19/2018 12:22 PM      [facility]

## 2018-07-19 NOTE — Telephone Encounter (Signed)
For chronic tramadol  -does she want referral to the pain clinic ? Otherwise rheumatology vs ortho will need to help manage her chronic pain   Gave #10 pills of tramadol   TMS

## 2018-07-19 NOTE — Anesthesia Preprocedure Evaluation (Signed)
Anesthesia Evaluation  Patient identified by MRN, date of birth, ID band Patient awake    Reviewed: Allergy & Precautions, NPO status , Patient's Chart, lab work & pertinent test results  History of Anesthesia Complications Negative for: history of anesthetic complications  Airway Mallampati: II  TM Distance: >3 FB Neck ROM: Full    Dental no notable dental hx.    Pulmonary neg sleep apnea, neg COPD, former smoker,    breath sounds clear to auscultation- rhonchi (-) wheezing      Cardiovascular Exercise Tolerance: Good (-) hypertension(-) CAD, (-) Past MI, (-) Cardiac Stents and (-) CABG  Rhythm:Regular Rate:Normal - Systolic murmurs and - Diastolic murmurs    Neuro/Psych  Headaches, PSYCHIATRIC DISORDERS Anxiety Depression    GI/Hepatic negative GI ROS, Neg liver ROS,   Endo/Other  negative endocrine ROSneg diabetes  Renal/GU negative Renal ROS     Musculoskeletal  (+) Arthritis ,   Abdominal (+) - obese,   Peds  Hematology negative hematology ROS (+)   Anesthesia Other Findings Past Medical History: No date: Allergy No date: Chronic pain No date: Chronic sinusitis No date: Depression No date: Heart murmur No date: HPV (human papilloma virus) anogenital infection No date: Migraine No date: Psoriatic arthritis (HCC)   Reproductive/Obstetrics                             Anesthesia Physical Anesthesia Plan  ASA: II  Anesthesia Plan: General   Post-op Pain Management:    Induction: Intravenous  PONV Risk Score and Plan: 2 and Propofol infusion  Airway Management Planned: Natural Airway  Additional Equipment:   Intra-op Plan:   Post-operative Plan:   Informed Consent: I have reviewed the patients History and Physical, chart, labs and discussed the procedure including the risks, benefits and alternatives for the proposed anesthesia with the patient or authorized  representative who has indicated his/her understanding and acceptance.   Dental advisory given  Plan Discussed with: CRNA and Anesthesiologist  Anesthesia Plan Comments:         Anesthesia Quick Evaluation

## 2018-07-19 NOTE — Op Note (Signed)
[facility] Patient Name: Barbara Faulkner Procedure Date: 07/19/2018 12:22 PM MRN: [id number] Account #: [id number] Date of Birth: [birth year] Admit Type: Outpatient Age: 52 Room: [facility] ENDO ROOM 2 Gender: Female Note Status: Finalized Procedure:            Colonoscopy Indications:          Screening for colorectal malignant neoplasm, This is                        the patient's first colonoscopy Providers:            Lin Landsman MD, MD Referring MD:         Nino Glow Mclean-Scocuzza MD, MD (Referring MD) Medicines:            Monitored Anesthesia Care Complications:        No immediate complications. Estimated blood loss: None. Procedure:            Pre-Anesthesia Assessment:                       - Prior to the procedure, a History and Physical was                        performed, and patient medications and allergies were                        reviewed. The patient is competent. The risks and                        benefits of the procedure and the sedation options and                        risks were discussed with the patient. All questions                        were answered and informed consent was obtained.                        Patient identification and proposed procedure were                        verified by the physician, the nurse, the                        anesthesiologist, the anesthetist and the technician in                        the pre-procedure area in the procedure room in the                        endoscopy suite. Mental Status Examination: alert and                        oriented. Airway Examination: normal oropharyngeal                        airway and neck mobility. Respiratory Examination:                        clear to auscultation. CV Examination: normal.  Prophylactic Antibiotics: The patient does not require                        prophylactic antibiotics. Prior Anticoagulants: The                         patient has taken no previous anticoagulant or                        antiplatelet agents. ASA Grade Assessment: II - A                        patient with mild systemic disease. After reviewing the                        risks and benefits, the patient was deemed in                        satisfactory condition to undergo the procedure. The                        anesthesia plan was to use monitored anesthesia care                        (MAC). Immediately prior to administration of                        medications, the patient was re-assessed for adequacy                        to receive sedatives. The heart rate, respiratory rate,                        oxygen saturations, blood pressure, adequacy of                        pulmonary ventilation, and response to care were                        monitored throughout the procedure. The physical status                        of the patient was re-assessed after the procedure.                       After obtaining informed consent, the colonoscope was                        passed under direct vision. Throughout the procedure,                        the patient's blood pressure, pulse, and oxygen                        saturations were monitored continuously. The                        Colonoscope was introduced through the anus and  advanced to the the cecum, identified by appendiceal                        orifice and ileocecal valve. The colonoscopy was                        extremely difficult due to significant looping and the                        patient's body habitus. Successful completion of the                        procedure was aided by changing the patient to a supine                        position, changing the patient to a prone position and                        applying abdominal pressure. The patient tolerated the                        procedure well. The quality of the  bowel preparation                        was evaluated using the BBPS [facility] Bowel Preparation                        Scale) with scores of: Right Colon = 2 (minor amount of                        residual staining, small fragments of stool and/or                        opaque liquid, but mucosa seen well), Transverse Colon                        = 2 (minor amount of residual staining, small fragments                        of stool and/or opaque liquid, but mucosa seen well)                        and Left Colon = 2 (minor amount of residual staining,                        small fragments of stool and/or opaque liquid, but                        mucosa seen well). The total BBPS score equals 6. The                        quality of the bowel preparation was fair. Findings:      Hemorrhoids were found on perianal exam.      External hemorrhoids were found during retroflexion. The hemorrhoids       were large.      A moderate amount of liquid stool was found in the entire colon,       precluding  visualization. Lavage of the area was performed using 200 -       500 mL of sterile water, resulting in clearance with fair visualization.      The exam was otherwise without abnormality. Impression:           - Preparation of the colon was fair.                       - Hemorrhoids found on perianal exam.                       - External hemorrhoids.                       - Stool in the entire examined colon.                       - The examination was otherwise normal.                       - No specimens collected. Recommendation:       - Discharge patient to home (with spouse).                       - Resume previous diet today.                       - Continue present medications.                       - Repeat colonoscopy in 3 years for screening purposes                        due to fair prep.                       - Return to my office as previously scheduled for                         hemorrhoid banding Dr. Ulyess Mort Lin Landsman MD, MD 07/19/2018 1:13:45 PM This report has been signed electronically. Number of Addenda: 0 Note Initiated On: 07/19/2018 12:22 PM Scope Withdrawal Time: 0 hours 11 minutes 9 seconds  Total Procedure Duration: 0 hours 25 minutes 15 seconds       [facility]

## 2018-07-19 NOTE — Anesthesia Post-op Follow-up Note (Signed)
Anesthesia QCDR form completed.        

## 2018-07-19 NOTE — Transfer of Care (Signed)
Immediate Anesthesia Transfer of Care Note  Patient: Barbara Faulkner  Procedure(s) Performed: COLONOSCOPY WITH PROPOFOL (N/A ) ESOPHAGOGASTRODUODENOSCOPY (EGD) WITH PROPOFOL (N/A )  Patient Location: PACU  Anesthesia Type:General  Level of Consciousness: sedated  Airway & Oxygen Therapy: Patient Spontanous Breathing and Patient connected to nasal cannula oxygen  Post-op Assessment: Report given to RN and Post -op Vital signs reviewed and stable  Post vital signs: Reviewed and stable  Last Vitals:  Vitals Value Taken Time  BP 84/48 07/19/2018  1:13 PM  Temp 36.2 C 07/19/2018  1:13 PM  Pulse 79 07/19/2018  1:13 PM  Resp 13 07/19/2018  1:13 PM  SpO2 97 % 07/19/2018  1:13 PM    Last Pain:  Vitals:   07/19/18 1313  TempSrc: Tympanic  PainSc: Asleep         Complications: No apparent anesthesia complications

## 2018-07-19 NOTE — Anesthesia Postprocedure Evaluation (Signed)
Anesthesia Post Note  Patient: Elroy ChannelLisa E Grisso  Procedure(s) Performed: COLONOSCOPY WITH PROPOFOL (N/A ) ESOPHAGOGASTRODUODENOSCOPY (EGD) WITH PROPOFOL (N/A )  Patient location during evaluation: Endoscopy Anesthesia Type: General Level of consciousness: awake and alert and oriented Pain management: pain level controlled Vital Signs Assessment: post-procedure vital signs reviewed and stable Respiratory status: spontaneous breathing, nonlabored ventilation and respiratory function stable Cardiovascular status: blood pressure returned to baseline and stable Postop Assessment: no signs of nausea or vomiting Anesthetic complications: no     Last Vitals:  Vitals:   07/19/18 1313 07/19/18 1323  BP: (!) 84/48 100/66  Pulse: 79 72  Resp: 13 13  Temp:    SpO2: 97% 98%    Last Pain:  Vitals:   07/19/18 1323  TempSrc:   PainSc: 0-No pain                 Kimbely Whiteaker

## 2018-07-20 ENCOUNTER — Other Ambulatory Visit: Payer: Self-pay | Admitting: Gastroenterology

## 2018-07-20 DIAGNOSIS — Z9884 Bariatric surgery status: Secondary | ICD-10-CM

## 2018-07-20 LAB — SURGICAL PATHOLOGY

## 2018-07-20 NOTE — Telephone Encounter (Signed)
Pt states she is awaiting an appt with [facility]. [facility] and released [facility] to [facility] as they requested. In the meantime, pt would like a referral to a pain clinic in [place] as she states she does have chronic pain.  Pt aware 10 tabs Tramadol sent to pharmacy

## 2018-07-21 ENCOUNTER — Other Ambulatory Visit: Payer: Self-pay | Admitting: Internal Medicine

## 2018-07-21 DIAGNOSIS — M7531 Calcific tendinitis of right shoulder: Secondary | ICD-10-CM | POA: Diagnosis not present

## 2018-07-21 DIAGNOSIS — M7541 Impingement syndrome of right shoulder: Secondary | ICD-10-CM | POA: Diagnosis not present

## 2018-07-21 DIAGNOSIS — M19211 Secondary osteoarthritis, right shoulder: Secondary | ICD-10-CM | POA: Diagnosis not present

## 2018-07-21 DIAGNOSIS — M25511 Pain in right shoulder: Secondary | ICD-10-CM | POA: Diagnosis not present

## 2018-07-21 DIAGNOSIS — G8929 Other chronic pain: Secondary | ICD-10-CM

## 2018-07-22 ENCOUNTER — Ambulatory Visit: Payer: Federal, State, Local not specified - PPO

## 2018-07-23 ENCOUNTER — Encounter: Payer: Self-pay | Admitting: Gastroenterology

## 2018-07-27 DIAGNOSIS — K080 Exfoliation of teeth due to systemic causes: Secondary | ICD-10-CM | POA: Diagnosis not present

## 2018-08-02 ENCOUNTER — Ambulatory Visit
Admission: RE | Admit: 2018-08-02 | Discharge: 2018-08-02 | Disposition: A | Discharge status: Home / Self Care | Payer: Federal, State, Local not specified - PPO | Source: Ambulatory Visit | Attending: Internal Medicine | Admitting: Internal Medicine

## 2018-08-02 DIAGNOSIS — F411 Generalized anxiety disorder: Secondary | ICD-10-CM | POA: Diagnosis not present

## 2018-08-02 DIAGNOSIS — R748 Abnormal levels of other serum enzymes: Secondary | ICD-10-CM | POA: Diagnosis not present

## 2018-08-02 DIAGNOSIS — Z9049 Acquired absence of other specified parts of digestive tract: Secondary | ICD-10-CM | POA: Insufficient documentation

## 2018-08-02 DIAGNOSIS — R7989 Other specified abnormal findings of blood chemistry: Secondary | ICD-10-CM | POA: Diagnosis not present

## 2018-08-09 DIAGNOSIS — F411 Generalized anxiety disorder: Secondary | ICD-10-CM | POA: Diagnosis not present

## 2018-08-10 DIAGNOSIS — F411 Generalized anxiety disorder: Secondary | ICD-10-CM | POA: Diagnosis not present

## 2018-08-16 DIAGNOSIS — F411 Generalized anxiety disorder: Secondary | ICD-10-CM | POA: Diagnosis not present

## 2018-08-19 ENCOUNTER — Encounter: Payer: Self-pay | Admitting: Student in an Organized Health Care Education/Training Program

## 2018-08-19 ENCOUNTER — Ambulatory Visit
Payer: Federal, State, Local not specified - PPO | Attending: Student in an Organized Health Care Education/Training Program | Admitting: Student in an Organized Health Care Education/Training Program

## 2018-08-19 ENCOUNTER — Other Ambulatory Visit: Payer: Self-pay

## 2018-08-19 VITALS — BP 124/78 | HR 82 | Temp 98.3°F | Resp 18 | Ht 63.0 in | Wt 166.4 lb

## 2018-08-19 DIAGNOSIS — K449 Diaphragmatic hernia without obstruction or gangrene: Secondary | ICD-10-CM | POA: Diagnosis not present

## 2018-08-19 DIAGNOSIS — G8929 Other chronic pain: Secondary | ICD-10-CM

## 2018-08-19 DIAGNOSIS — M25551 Pain in right hip: Secondary | ICD-10-CM | POA: Diagnosis not present

## 2018-08-19 DIAGNOSIS — M25552 Pain in left hip: Secondary | ICD-10-CM

## 2018-08-19 DIAGNOSIS — A6920 Lyme disease, unspecified: Secondary | ICD-10-CM | POA: Diagnosis not present

## 2018-08-19 DIAGNOSIS — F32A Depression, unspecified: Secondary | ICD-10-CM

## 2018-08-19 DIAGNOSIS — Z9884 Bariatric surgery status: Secondary | ICD-10-CM | POA: Diagnosis not present

## 2018-08-19 DIAGNOSIS — K2960 Other gastritis without bleeding: Secondary | ICD-10-CM

## 2018-08-19 DIAGNOSIS — N938 Other specified abnormal uterine and vaginal bleeding: Secondary | ICD-10-CM | POA: Insufficient documentation

## 2018-08-19 DIAGNOSIS — E559 Vitamin D deficiency, unspecified: Secondary | ICD-10-CM | POA: Insufficient documentation

## 2018-08-19 DIAGNOSIS — M25511 Pain in right shoulder: Secondary | ICD-10-CM

## 2018-08-19 DIAGNOSIS — M25711 Osteophyte, right shoulder: Secondary | ICD-10-CM | POA: Diagnosis not present

## 2018-08-19 DIAGNOSIS — F419 Anxiety disorder, unspecified: Secondary | ICD-10-CM

## 2018-08-19 DIAGNOSIS — G894 Chronic pain syndrome: Secondary | ICD-10-CM

## 2018-08-19 DIAGNOSIS — F329 Major depressive disorder, single episode, unspecified: Secondary | ICD-10-CM | POA: Diagnosis not present

## 2018-08-19 DIAGNOSIS — Z87891 Personal history of nicotine dependence: Secondary | ICD-10-CM | POA: Insufficient documentation

## 2018-08-19 DIAGNOSIS — M25561 Pain in right knee: Secondary | ICD-10-CM | POA: Diagnosis not present

## 2018-08-19 DIAGNOSIS — Z79899 Other long term (current) drug therapy: Secondary | ICD-10-CM | POA: Diagnosis not present

## 2018-08-19 DIAGNOSIS — M25562 Pain in left knee: Secondary | ICD-10-CM | POA: Diagnosis not present

## 2018-08-19 DIAGNOSIS — M19011 Primary osteoarthritis, right shoulder: Secondary | ICD-10-CM

## 2018-08-19 DIAGNOSIS — J309 Allergic rhinitis, unspecified: Secondary | ICD-10-CM | POA: Insufficient documentation

## 2018-08-19 DIAGNOSIS — Z6829 Body mass index (BMI) 29.0-29.9, adult: Secondary | ICD-10-CM | POA: Insufficient documentation

## 2018-08-19 DIAGNOSIS — G43909 Migraine, unspecified, not intractable, without status migrainosus: Secondary | ICD-10-CM | POA: Insufficient documentation

## 2018-08-19 DIAGNOSIS — M2550 Pain in unspecified joint: Secondary | ICD-10-CM

## 2018-08-19 DIAGNOSIS — L4050 Arthropathic psoriasis, unspecified: Secondary | ICD-10-CM | POA: Insufficient documentation

## 2018-08-19 DIAGNOSIS — T39395A Adverse effect of other nonsteroidal anti-inflammatory drugs [NSAID], initial encounter: Secondary | ICD-10-CM

## 2018-08-19 MED ORDER — TIZANIDINE HCL 4 MG PO CAPS: 4.0000 mg | ORAL_CAPSULE | Freq: Every evening | ORAL | 2 refills | Status: DC | PRN

## 2018-08-19 NOTE — Progress Notes (Signed)
Safety precautions to be maintained throughout the outpatient stay will include: orient to surroundings, keep bed in low position, maintain call bell within reach at all times, provide assistance with transfer out of bed and ambulation.  

## 2018-08-19 NOTE — Progress Notes (Addendum)
Patient's Name: Barbara Faulkner  MRN: [id number]  Referring Provider: Orland Mustard *  DOB: October 30, 1965  PCP: McLean-Scocuzza, Nino Glow, MD  DOS: 08/19/2018  Note by: Gillis Santa, MD  Service setting: Ambulatory outpatient  Specialty: Interventional Pain Management  Location: [facility] (AMB) Pain Management Facility  Visit type: Initial Patient Evaluation  Patient type: New Patient   Primary Reason(s) for Visit: Encounter for initial evaluation of one or more chronic problems (new to examiner) potentially causing chronic pain, and posing a threat to normal musculoskeletal function. (Level of risk: High) CC: Joint Pain (generalized)  HPI  [name] is a 52 y.o. year old, female patient, who comes today to see [place] for the first time for an initial evaluation of her chronic pain. 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 Today she comes in for evaluation of her Joint Pain (generalized)  Pain Assessment: Location:   Other (Comment)(generalized joint pain) Radiating:   Onset: More than a month ago Duration: Chronic pain Quality: Sharp(agonizing) Severity: 7 /10 (subjective, self-reported pain score)  Note: Reported level is inconsistent with clinical observations. Clinically the patient looks like a 2/10 A 2/10 is viewed as "Mild to Moderate" and described as noticeable and distracting. Impossible to hide from other people. More frequent flare-ups. Still possible to adapt and function  close to normal. It can be very annoying and may have occasional stronger flare-ups. With discipline, patients may get used to it and adapt. Information on the proper use of the pain scale provided to the patient today. When using our objective Pain Scale, levels between 6 and 10/10 are said to belong in an emergency room, as it progressively worsens from a 6/10, described as severely limiting, requiring emergency care not usually available at an outpatient pain management facility. At a 6/10 level, communication becomes difficult and requires great effort. Assistance to reach the emergency department may be required. Facial flushing and profuse sweating along with potentially dangerous increases in heart rate and blood pressure will be evident. Effect on ADL: limits daily activities Timing: Constant Modifying factors: cortisone shots help in the hip, Tramadol BP: 124/78  HR: 82  Onset and Duration: Date of injury: 2013 Cause of pain: Lyme Disease Severity: No change since onset, NAS-11 at its worse: 10/10, NAS-11 at its best: 4/10, NAS-11 now: 7/10 and NAS-11 on the average: 8/10 Timing: Not influenced by the time of the day and After a period of immobility Aggravating Factors: Bending, Kneeling, Lifiting, Motion, Prolonged sitting, Squatting and Stooping  Alleviating Factors: Stretching and Medications Associated Problems: Night-time cramps, Depression, Pain that wakes patient up and Pain that does not allow patient to sleep Quality of Pain: Agonizing, Punishing and Sharp Previous Examinations or Tests: MRI scan, X-rays and Orthopedic evaluation Previous Treatments: Narcotic medications and Stretching exercises  The patient comes into the clinics today for the first time for a chronic pain management evaluation.   52 year old female with a chief complaint of right shoulder pain.  History of gastric bypass surgery for morbid obesity.  Patient currently weighs 155 pounds.  Patient has seen Dr.  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  She endorses moderate benefit yet short lasting with this injection.  Patient also has a history of depression which is being managed by her PCP.  Patient's pain has been managed on tramadol 50 mg, quantity 30/month. She was previously taking 3-4x/day prn. She works at [employer].  She is unable to tolerate NSAIDs given history of gastric bypass surgery.  Patient also endorses leg cramps on most nights of the week.  These are usually in her posterior thigh or in her calf region.  Patient states that she is keeping herself hydrated and also taking a multivitamin.  Historic Controlled Substance Pharmacotherapy Review  PMP and historical list of controlled substances: Tramadol 50 mg, quantity 10, last fill 07/20/2018 Medications: The patient did not bring the medication(s) to the appointment, as requested in our "New Patient Package" Pharmacodynamics: Desired effects: Analgesia: The patient reports 50% benefit. Reported improvement in function: The patient reports medication allows her to accomplish basic ADLs. Clinically meaningful improvement in function (CMIF): Sustained CMIF goals met Perceived effectiveness: Described as relatively effective, allowing for increase in activities of daily living (ADL) Undesirable effects: Side-effects or Adverse reactions: None reported Historical Monitoring: The patient  reports no history of drug use. List of all UDS Test(s): No results found for: MDMA, COCAINSCRNUR, [place], [place], CANNABQUANT, [place], [place] List of other Serum/Urine Drug Screening Test(s):  No results found for: AMPHSCRSER, BARBSCRSER, BENZOSCRSER, COCAINSCRSER, COCAINSCRNUR, PCPSCRSER, PCPQUANT, THCSCRSER, THCU, CANNABQUANT, OPIATESCRSER, OXYSCRSER, PROPOXSCRSER, ETH Historical Background  Evaluation: [place] PMP: Six (6) year initial data search conducted.             [place] Department of public safety, offender search: Editor, commissioning Information) Non-contributory Risk Assessment Profile: Aberrant behavior: None observed or detected today Risk factors for fatal opioid overdose: age 51-55 years old and None identified today Fatal overdose hazard ratio (HR): Calculation deferred Non-fatal overdose hazard ratio (HR): Calculation deferred Risk of opioid abuse or dependence: 0.7-3.0% with doses ? 36 MME/day and 6.1-26% with doses ? 120 MME/day. Substance use disorder (SUD) risk level: See below Personal History of Substance Abuse (SUD-Substance use disorder):  Alcohol:    Illegal Drugs:    Rx Drugs:    ORT Risk Level calculation:    ORT Scoring interpretation table:  Score <3 = Low Risk for SUD  Score between 4-7 = Moderate Risk for SUD  Score >8 = High Risk for Opioid Abuse   PHQ-2 Depression Scale:  Total score:    PHQ-2 Scoring interpretation table: (Score and probability of major depressive disorder)  Score 0 = No depression  Score 1 = 15.4% Probability  Score 2 = 21.1% Probability  Score 3 = 38.4% Probability  Score 4 = 45.5% Probability  Score 5 = 56.4% Probability  Score 6 = 78.6% Probability   PHQ-9 Depression Scale:  Total score:    PHQ-9 Scoring interpretation table:  Score 0-4 = No depression  Score 5-9 = Mild depression  Score 10-14 = Moderate depression  Score 15-19 = Moderately severe depression  Score 20-27 = Severe depression (2.4 times higher risk of SUD and 2.89 times higher risk of overuse)   Pharmacologic Plan: As per protocol, I have not taken over any controlled substance management, pending the results of ordered tests and/or consults.            Initial impression: Pending review of available data and ordered tests.  Meds  Current Outpatient Medications:  .  Cholecalciferol 1.25 MG (50000 UT) capsule, Take 1 capsule (50,000 Units total) by mouth once  a week., Disp: 13 capsule, Rfl: 1 .  diclofenac sodium (VOLTAREN) 1 % GEL, Apply topically 4 (four) times daily., Disp: , Rfl:  .  Multiple Vitamin (MULTIVITAMIN) tablet, Take 1 tablet by mouth daily., Disp: , Rfl:  .  omeprazole (PRILOSEC) 40 MG capsule, Take 1 capsule (40 mg total) by mouth 2 (two) times daily before a meal., Disp: 180 capsule, Rfl: 0 .  PARoxetine (PAXIL) 40 MG tablet, TAKE 1 TABLET(40 MG) BY MOUTH DAILY, Disp: 90 tablet, Rfl: 1 .  traMADol (ULTRAM) 50 MG tablet, TAKE 1 TABLET BY MOUTH DAILY TO BID PRN FOR SHOULDER OR ARTHRITIS PAIN, Disp: 10 tablet, Rfl: 0 .  Cholecalciferol (VITAMIN D3) 50 MCG (2000 UT) capsule, Take by mouth., Disp: , Rfl:  .  cholestyramine (QUESTRAN) 4 g packet, Take 1 packet (4 g total) by mouth 2 (two) times daily., Disp: 60 packet, Rfl: 0 .  tiZANidine (ZANAFLEX) 4 MG capsule, Take 1 capsule (4 mg total) by mouth at bedtime as needed for muscle spasms (leg cramps)., Disp: 30 capsule, Rfl: 2  Imaging Review   Results for orders placed during the hospital encounter of 07/07/18  MR SHOULDER RIGHT WO CONTRAST   Narrative CLINICAL DATA:  Right shoulder pain for 2 months. No known injury or prior relevant surgery. Decreased range of motion.  EXAM: MRI OF THE RIGHT SHOULDER WITHOUT CONTRAST  TECHNIQUE: Multiplanar, multisequence MR imaging of the shoulder was performed. No intravenous contrast was administered.  COMPARISON:  Radiographs 05/27/2018.  FINDINGS: Rotator cuff: Mild supraspinatus and infraspinatus tendinosis without evidence of tear. In correlation with the prior radiographs, there is probable hydroxyapatite deposition along the bursal surface of the infraspinatus tendon insertion, without surrounding soft tissue edema. The subscapularis and teres minor tendons appear normal.  Muscles:  No focal muscular atrophy or edema.  Biceps long head: Mild tendinosis of the intra-articular portion. The tendon is normally located within the  bicipital groove.  Acromioclavicular Joint: The acromion is type 2. There are mild acromioclavicular degenerative changes. No significant fluid is present in the subacromial - subdeltoid bursa.  Glenohumeral Joint: There are age advanced glenohumeral degenerative changes with chondral thinning and prominent subchondral cyst formation throughout the anterior and inferior aspects of the glenoid. There are small humeral head osteophytes. No significant joint effusion or loose body identified.  Labrum: The labrum is diffusely degenerated, especially anteriorly and inferiorly. No paralabral cyst identified.  Bones: No acute or significant extra-articular osseous findings.  Other: No significant soft tissue findings.  IMPRESSION: 1. The primary abnormality is age advanced glenohumeral arthropathy with prominent subchondral cyst formation in the glenoid. 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 2. Associated diffuse labral degeneration. 3. In correlation with the prior radiographs, there is infraspinatus calcific tendinosis, but no evidence of rotator cuff tear. 4. Mild bicipital tendinosis and mild acromioclavicular degenerative changes.   Electronically Signed   By: Richardean Sale M.D.   On: 07/07/2018 11:10     Results for orders placed in visit on 05/27/18  DG Shoulder Right   Narrative CLINICAL DATA:  Right shoulder pain for several weeks.  No injury.  EXAM: RIGHT SHOULDER - 2+ VIEW  COMPARISON:  None.  FINDINGS: No fracture.  No bone lesion.  There is narrowing of the glenohumeral joint with subchondral cystic change. Marginal osteophytes project from the inferior humeral head.  There is narrowing with small marginal osteophytes  at the [facility] joint.  Calcification is seen just above the greater tuberosity of the proximal humerus at the superior rotator cuff insertion. Soft tissues otherwise unremarkable.  IMPRESSION: 1. No fracture or dislocation.  No bone lesion. 2. Moderate  glenohumeral joint arthropathic changes. Mild AC joint osteoarthritis. 3. Calcification projects above the greater tuberosity consistent with rotator cuff calcific tendinitis.   Electronically Signed   By: Lajean Manes M.D.   On: 05/28/2018 09:04    Results for orders placed during the hospital encounter of 05/22/15  MR Hip Right W Contrast   Narrative CLINICAL DATA:  Right hip pain for 1 year.  EXAM: MRI OF THE RIGHT HIP WITH CONTRAST(MR Arthrogram)  TECHNIQUE: Multiplanar, multisequence MR imaging of the hip was performed immediately following contrast injection into the hip joint under fluoroscopic guidance. No intravenous contrast was administered.  COMPARISON:  None.  FINDINGS: Bone  No fracture, dislocation or avascular necrosis. Normal sacrum and sacroiliac joints.  Alignment  Normal. No subluxation.  Dysplasia  None.  Joint effusion  Intraarticular contrast.  Labrum  Normal. No labral tear.  Cartilage  Femoral cartilage: Small area of cartilage loss with small area of subchondral cystic change in the superior medial femoral head.  Acetabular cartilage: Normal.  Capsule and ligaments  Normal.  Muscles and Tendons  Flexors: Normal.  Extensors: Normal.  Abductors: Normal.  Adductors: Normal.  Rotators: Normal.  Hamstrings: Normal.  Other Findings  None  Viscera  Normal. No abnormality seen in pelvis. No lymphadenopathy. No free fluid in the pelvis.  IMPRESSION: 1. No evidence of a stress fracture of the right hip. 2. No right labral tear. 3. Small area of cartilage loss with small area of subchondral cystic change in the superior medial femoral head.   Electronically Signed   By: Kathreen Devoid   On: 05/22/2015 16:46     Hip-R DG 2-3 views:  Results for orders placed during the hospital encounter of 04/18/15  DG HIP UNILAT WITH PELVIS 2-3 VIEWS RIGHT   Narrative CLINICAL DATA:  Pelvic and right hip pain for 1-1/2  years, no trauma, pain increases with activity  EXAM: DG HIP (WITH OR WITHOUT PELVIS) 2-3V RIGHT  COMPARISON:  None.  FINDINGS: The hip joint spaces appear normal. No significant degenerative change is seen. The pelvic rami are intact. The SI joints are corticated. Faint calcification in the mid lower pelvis probably represents material within bowel.  IMPRESSION: Negative.   Electronically Signed   By: Ivar Drape M.D.   On: 04/18/2015 09:02     Complexity Note: Imaging results reviewed. Results shared with Ms. Swatzell, using Layman's terms.                         ROS  Cardiovascular: No reported cardiovascular signs or symptoms such as High blood pressure, coronary artery disease, abnormal heart rate or rhythm, heart attack, blood thinner therapy or heart weakness and/or failure Pulmonary or Respiratory: has been told that she snores Neurological: No reported neurological signs or symptoms such as seizures, abnormal skin sensations, urinary and/or fecal incontinence, being born with an abnormal open spine and/or a tethered spinal cord Review of Past Neurological Studies: No results found for this or any previous visit. Psychological-Psychiatric: Depressed Gastrointestinal: Heartburn due to stomach pushing into lungs (Hiatal hernia) Genitourinary: No reported renal or genitourinary signs or symptoms such as difficulty voiding or producing urine, peeing blood, non-functioning kidney, kidney stones, difficulty emptying the bladder, difficulty controlling  the flow of urine, or chronic kidney disease Hematological: No reported hematological signs or symptoms such as prolonged bleeding, low or poor functioning platelets, bruising or bleeding easily, hereditary bleeding problems, low energy levels due to low hemoglobin or being anemic Endocrine: No reported endocrine signs or symptoms such as high or low blood sugar, rapid heart rate due to high thyroid levels, obesity or weight gain  due to slow thyroid or thyroid disease Rheumatologic: No reported rheumatological signs and symptoms such as fatigue, joint pain, tenderness, swelling, redness, heat, stiffness, decreased range of motion, with or without associated rash Musculoskeletal: Negative for myasthenia gravis, muscular dystrophy, multiple sclerosis or malignant hyperthermia Work History: Working full time  Allergies  Ms. Shidler is allergic to shellfish allergy and hydrocod polst-cpm polst er.  Laboratory Chemistry  Inflammation Markers (CRP: Acute Phase) (ESR: Chronic Phase) Lab Results  Component Value Date   ESRSEDRATE 9 02/20/2012                         Rheumatology Markers Lab Results  Component Value Date   RF <10 02/20/2012   LABURIC 4.8 10/29/2015                        Renal Function Markers Lab Results  Component Value Date   BUN 13 07/14/2018   CREATININE 0.86 41/66/0630   BCR NOT APPLICABLE 16/09/930   GFRAA >60 02/15/2014   GFRNONAA >60 02/15/2014                             Hepatic Function Markers Lab Results  Component Value Date   AST 74 (H) 07/14/2018   ALT 64 (H) 07/14/2018   ALBUMIN 3.9 03/26/2016   ALKPHOS 93 03/26/2016   AMYLASE 33 09/25/2014   LIPASE 124 10/04/2013                        Electrolytes Lab Results  Component Value Date   NA 144 07/14/2018   K 4.3 07/14/2018   CL 112 (H) 07/14/2018   CALCIUM 8.3 (L) 07/14/2018   MG 1.9 07/14/2018   PHOS 5.0 (H) 09/25/2014                        Neuropathy Markers Lab Results  Component Value Date   VITAMINB12 612 07/14/2018   FOLATE 7.3 09/25/2014   HGBA1C 5.5 10/04/2013                        CNS Tests No results found for: COLORCSF, APPEARCSF, RBCCOUNTCSF, WBCCSF, POLYSCSF, LYMPHSCSF, EOSCSF, PROTEINCSF, GLUCCSF, JCVIRUS, CSFOLI, IGGCSF                      Bone Pathology Markers Lab Results  Component Value Date   VD25OH 10 (L) 07/14/2018                         Coagulation Parameters Lab  Results  Component Value Date   INR 0.9 10/04/2013   LABPROT 12.1 10/04/2013   APTT 30.0 10/04/2013   PLT 156 07/14/2018                        Cardiovascular Markers Lab Results  Component Value Date   CKTOTAL 162 02/20/2012  HGB 10.8 (L) 07/14/2018   HCT 34.4 (L) 07/14/2018                         CA Markers No results found for: CEA, CA125, LABCA2                      Note: Lab results reviewed.  [place]  Drug: Ms. Wak[place]  reports no history of drug use. Alcohol:  reports no history of alcohol use. Tobacco:  reports that she quit smoking about 14 years ago. Her smoking use included cigarettes. She has a 40.00 pack-year smoking history. She has never used smokeless tobacco. Medical:  has a past medical history of Allergy, Chronic pain, Chronic sinusitis, Depression, Heart murmur, HPV (human papilloma virus) anogenital infection, Migraine, and Psoriatic arthritis ([place]). Family: family history includes HIV in her father; Hepatitis in her maternal uncle; Hyperlipidemia in her mother; Hypertension in her mother; Lung cancer in her maternal grandfather.  Past Surgical History:  Procedure Laterality Date  . BREAST BIOPSY Right 01/28/2018   US guided biopsy - heart shaped  . CHOLECYSTECTOMY  2009  . COLONOSCOPY WITH PROPOFOL N/A 07/19/2018   Procedure: COLONOSCOPY WITH PROPOFOL;  Surgeon: Lin Landsman, MD;  Location: [facility] ENDOSCOPY;  Service: Gastroenterology;  Laterality: N/A;  . ESOPHAGOGASTRODUODENOSCOPY (EGD) WITH PROPOFOL N/A 07/19/2018   Procedure: ESOPHAGOGASTRODUODENOSCOPY (EGD) WITH PROPOFOL;  Surgeon: Lin Landsman, MD;  Location: [facility] ENDOSCOPY;  Service: Gastroenterology;  Laterality: N/A;  . GASTRIC BYPASS     2015; duodenal switch   . LAPAROSCOPIC GASTRIC BANDING  2008    removed 2009  . TUBAL LIGATION  1997   Active Ambulatory Problems    Diagnosis Date Noted  . MORBID OBESITY 05/17/2008  . MIGRAINE W/AURA W/O INTRACT W/O STATUS MIGRNOSUS 05/17/2008   . ALLERGIC RHINITIS 03/15/2010  . THYROMEGALY 09/03/2010  . 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 DUB (dysfunctional uterine bleeding) 10/27/2012  . Psoriatic arthritis ([place]) 09/13/2013  . Acute sinusitis 01/08/2014  . Right hip pain 04/18/2015  . Abnormal laboratory test result 03/26/2016  . Proteinuria 03/26/2016  . Parotiditis 05/01/2016  . Back pain 09/11/2016  . Large breasts 09/11/2016  . Anxiety and depression 11/24/2016  . Hemorrhoids 05/01/2017  . Right shoulder pain 06/15/2018  . Abdominal pain, epigastric 07/08/2018  . Colon cancer screening   . Elevated liver enzymes 07/19/2018  . Vitamin D deficiency 07/19/2018   Resolved Ambulatory Problems    Diagnosis Date Noted  . Dermatophytosis of foot 12/20/2009  . Adjustment disorder with mixed anxiety and depressed mood 05/17/2008  . FATIGUE 09/17/2009  . FINGER SPRAIN 05/17/2008  . SINUSITIS - ACUTE-NOS 09/27/2010  . Routine general medical examination at a health care facility 04/23/2011  . Menorrhagia 04/23/2011  . Fatigue 09/18/2011  . Back pain 11/26/2011  . Malaise 01/30/2012  . 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 Lyme disease 06/22/2012  . Routine general medical examination at a health care facility 09/13/2013  . Acute maxillary sinusitis 07/20/2014  . S/P bariatric surgery 09/25/2014  . Arthralgia of toe of right foot 10/29/2015  . Acute upper respiratory infection 11/26/2015   Past Medical History:  Diagnosis Date  . Allergy   . Chronic pain   . Chronic sinusitis   . Depression   . Heart murmur   . HPV (human papilloma virus) anogenital infection   . Migraine  Constitutional Exam  General appearance: Well nourished, well developed, and well hydrated. In no apparent acute distress Vitals:   08/19/18 0920  BP: 124/78  Pulse: 82  Resp: 18  Temp: 98.3 F (36.8 C)  TempSrc: Oral  SpO2: 98%  Weight: 166 lb  6.4 oz (75.5 kg)  Height: '5\' 3"'  (1.6 m)   BMI Assessment: Estimated body mass index is 29.48 kg/m as calculated from the following:   Height as of this encounter: '5\' 3"'  (1.6 m).   Weight as of this encounter: 166 lb 6.4 oz (75.5 kg).  BMI interpretation table: BMI level Category Range association with higher incidence of chronic pain  <18 kg/m2 Underweight   18.5-24.9 kg/m2 Ideal body weight   25-29.9 kg/m2 Overweight Increased incidence by 20%  30-34.9 kg/m2 Obese (Class I) Increased incidence by 68%  35-39.9 kg/m2 Severe obesity (Class II) Increased incidence by 136%  >40 kg/m2 Extreme obesity (Class III) Increased incidence by 254%   Patient's current BMI Ideal Body weight  Body mass index is 29.48 kg/m. Ideal body weight: 52.4 kg (115 lb 8.3 oz) Adjusted ideal body weight: 61.6 kg (135 lb 14 oz)   BMI Readings from Last 4 Encounters:  08/19/18 29.48 kg/m  07/19/18 27.46 kg/m  07/07/18 28.73 kg/m  07/06/18 28.63 kg/m   Wt Readings from Last 4 Encounters:  08/19/18 166 lb 6.4 oz (75.5 kg)  07/19/18 155 lb (70.3 kg)  07/07/18 162 lb 3.2 oz (73.6 kg)  07/06/18 161 lb 9.6 oz (73.3 kg)  Psych/Mental status: Alert, oriented x 3 (person, place, & time)       Eyes: PERLA Respiratory: No evidence of acute respiratory distress  Cervical Spine Area Exam  Skin & Axial Inspection: No masses, redness, edema, swelling, or associated skin lesions Alignment: Symmetrical Functional ROM: Unrestricted ROM      Stability: No instability detected Muscle Tone/Strength: Functionally intact. No obvious neuro-muscular anomalies detected. Sensory (Neurological): Musculoskeletal pain pattern Palpation: No palpable anomalies              Upper Extremity (UE) Exam    Side: Right upper extremity  Side: Left upper extremity  Skin & Extremity Inspection: Skin color, temperature, and hair growth are WNL. No peripheral edema or cyanosis. No masses, redness, swelling, asymmetry, or associated skin  lesions. No contractures.  Skin & Extremity Inspection: Skin color, temperature, and hair growth are WNL. No peripheral edema or cyanosis. No masses, redness, swelling, asymmetry, or associated skin lesions. No contractures.  Functional ROM: Decreased ROM for shoulder  Functional ROM: Unrestricted ROM          Muscle Tone/Strength: Functionally intact. No obvious neuro-muscular anomalies detected.  Muscle Tone/Strength: Functionally intact. No obvious neuro-muscular anomalies detected.  Sensory (Neurological): Musculoskeletal pain pattern          Sensory (Neurological): Musculoskeletal pain pattern          Palpation: No palpable anomalies              Palpation: No palpable anomalies              Provocative Test(s):  Phalen's test: deferred Tinel's test: deferred Apley's scratch test (touch opposite shoulder):  Action 1 (Across chest): Decreased ROM Action 2 (Overhead): Decreased ROM Action 3 (LB reach): Decreased ROM   Provocative Test(s):  Phalen's test: deferred Tinel's test: deferred Apley's scratch test (touch opposite shoulder):  Action 1 (Across chest): deferred Action 2 (Overhead): deferred Action 3 (LB reach): deferred    Thoracic Spine  Area Exam  Skin & Axial Inspection: No masses, redness, or swelling Alignment: Symmetrical Functional ROM: Unrestricted ROM Stability: No instability detected Muscle Tone/Strength: Functionally intact. No obvious neuro-muscular anomalies detected. Sensory (Neurological): Unimpaired Muscle strength & Tone: No palpable anomalies  Lumbar Spine Area Exam  Skin & Axial Inspection: No masses, redness, or swelling Alignment: Symmetrical Functional ROM: Unrestricted ROM       Stability: No instability detected Muscle Tone/Strength: Functionally intact. No obvious neuro-muscular anomalies detected. Sensory (Neurological): Musculoskeletal pain pattern Palpation: No palpable anomalies       Provocative Tests: Hyperextension/rotation test:  deferred today       Lumbar quadrant test (Kemp's test): deferred today       Lateral bending test: deferred today       Patrick's Maneuver: deferred today                   FABER* test: deferred today                   S-I anterior distraction/compression test: deferred today         S-I lateral compression test: deferred today         S-I Thigh-thrust test: deferred today         S-I Gaenslen's test: deferred today         *(Flexion, ABduction and External Rotation)  Gait & Posture Assessment  Ambulation: Unassisted Gait: Relatively normal for age and body habitus Posture: WNL   Lower Extremity Exam    Side: Right lower extremity  Side: Left lower extremity  Stability: No instability observed          Stability: No instability observed          Skin & Extremity Inspection: Skin color, temperature, and hair growth are WNL. No peripheral edema or cyanosis. No masses, redness, swelling, asymmetry, or associated skin lesions. No contractures.  Skin & Extremity Inspection: Skin color, temperature, and hair growth are WNL. No peripheral edema or cyanosis. No masses, redness, swelling, asymmetry, or associated skin lesions. No contractures.  Functional ROM: Unrestricted ROM                  Functional ROM: Unrestricted ROM                  Muscle Tone/Strength: Functionally intact. No obvious neuro-muscular anomalies detected.  Muscle Tone/Strength: Functionally intact. No obvious neuro-muscular anomalies detected.  Sensory (Neurological): Musculoskeletal pain pattern        Sensory (Neurological): Musculoskeletal pain pattern        DTR: Patellar: deferred today Achilles: deferred today Plantar: deferred today  DTR: Patellar: deferred today Achilles: deferred today Plantar: deferred today  Palpation: No palpable anomalies  Palpation: No palpable anomalies   Assessment  Primary Diagnosis & Pertinent Problem List: The primary encounter diagnosis was Arthropathy of right shoulder.  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  Visit Diagnosis (New problems to examiner): 1. Arthropathy of right shoulder   2. 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 Chronic arthralgias of knees and hips   4. Glenohumeral arthritis, right   5. Chronic right shoulder pain   6. Anxiety and depression   7. Gastritis due to nonsteroidal anti-inflammatory drug   8. Chronic pain syndrome  Very pleasant 52 year old female with a history of Lyme disease, morbid obesity status post gastric bypass surgery (currently weighs 150 pounds).  She complains of arthralgias that are most pronounced in bilateral shoulders, right greater than left, bilateral hips, right greater than left and bilateral knees.  Patient has had an ultrasound guided glenohumeral joint injection with Dr. Candelaria Stagers which she found effective for approximately 2 to 3 weeks.  Patient also endorses leg cramps.  In regards to therapeutic plan, patient takes tramadol 50 mg daily.  She states that she when she was seeing her previous provider she was taking 50 mg 3 times a day to 4 times a day as needed.  She states that this was managing her pain better.  She works at the [employer].  She does have a history of depression that is being managed by her primary care physician.  Plan: -UDS today. -Can take over tramadol at next visit pending UDS -Tizanidine 4 mg nightly as needed leg/muscle cramps.  Orders Placed This Encounter  Procedures  . Compliance Drug Analysis, Ur   Pharmacological management options:  Opioid Analgesics: The patient was informed that there is no guarantee that she would be a candidate for opioid analgesics. The decision will be made following CDC guidelines. This decision will be based  on the results of diagnostic studies, as well as Ms. Desouza's risk profile.  Tramadol 50 mg 3 times daily to 4 times daily as needed  Membrane stabilizer: To be determined at a later time consider gabapentin, Lyrica or Cymbalta  Muscle relaxant: To be determined at a later time, trial of tizanidine  NSAID: Medically contraindicated history of gastric bypass  Other analgesic(s): To be determined at a later time    Provider-requested follow-up: 4 weeks  Future Appointments  Date Time Provider [employer]  08/20/2018  9:00 AM Lin Landsman, MD [facility] None  08/24/2018  8:30 AM McLean-Scocuzza, Nino Glow, MD [facility]  09/08/2018  1:15 PM Gillis Santa, MD [facility] None    Primary Care Physician: McLean-Scocuzza, Nino Glow, MD Location: [facility] Note by: Gillis Santa, M.D, Date: 08/19/2018; Time: 2:10 PM  There are no Patient Instructions on file for this visit.

## 2018-08-20 ENCOUNTER — Ambulatory Visit (INDEPENDENT_AMBULATORY_CARE_PROVIDER_SITE_OTHER): Payer: Federal, State, Local not specified - PPO | Admitting: Gastroenterology

## 2018-08-20 ENCOUNTER — Encounter: Payer: Self-pay | Admitting: Gastroenterology

## 2018-08-20 VITALS — BP 129/86 | HR 83 | Ht 63.0 in | Wt 161.8 lb

## 2018-08-20 DIAGNOSIS — D509 Iron deficiency anemia, unspecified: Secondary | ICD-10-CM

## 2018-08-20 DIAGNOSIS — K641 Second degree hemorrhoids: Secondary | ICD-10-CM

## 2018-08-20 DIAGNOSIS — Z9884 Bariatric surgery status: Secondary | ICD-10-CM | POA: Diagnosis not present

## 2018-08-20 NOTE — Progress Notes (Signed)
Arlyss Repressohini R Saahir Prude, MD [address]  [address]  [place], [place] [address]  Main: [phone]  Fax: [phone]    Gastroenterology Consultation  Referring Provider:     McLean-Scocuzza, French Anaracy * Primary Care Physician:  McLean-Scocuzza, Pasty Spillersracy N, MD Primary Gastroenterologist:  Dr. Arlyss Repressohini R Ivey Nembhard Reason for Consultation:     Epigastric pain, symptomatic hemorrhoids        HPI:   Elroy ChannelLisa E Pillars is a 52 y.o. female referred by Dr. Judie GrieveMcLean-Scocuzza, Pasty Spillersracy N, MD  for consultation & management of symptomatic hemorrhoids, altered bowel habits, chronic epigastric pain.  Patient had a history of duodenal switch in 01/2014 and lost about 100 pounds  Chronic epigastric pain: Intermittent, sharp in nature, lasts about 3 minutes, sporadic, unpredictable, not associated with nausea or vomiting or abdominal bloating.  Severe intensity.  Occurs about few times a month.  No aggravating or relieving factors.  Patient used to take PPI in the past  Altered bowel habits: Patient had duodenal switch in 01/2014.  Since then, she has been having an obstructive diarrhea, in the beginning, she was having 15 episodes per day, currently having about 6/day.  He about once or twice a month, she has severe constipation associated with hard stool, significant straining.  She uses MiraLAX and suppository which relieves the constipation.  Symptomatic hemorrhoids: She has been experiencing pain, pressure, itching and irritation since her last pregnancy which was 35 years ago.  Currently, her symptoms are manageable but she is interested to undergo hemorrhoid ligation Patient works in the postal office She denies smoking or alcohol use  Follow-up visit 08/20/2018 Patient did not start taking oral iron supplements.  Her EGD was unremarkable.  There was no evidence of anastomotic ulcer.  Gastric biopsies did not reveal Helicobacter pylori.  Started her on omeprazole 40 mg twice daily which helped with epigastric pain.   She now reports hiccups in the end of each meal which last about 5 minutes.  She does feel constipated.  She is not taking MiraLAX and she does not like taking medication.  She is here to discuss about hemorrhoid ligation  NSAIDs: none  Antiplts/Anticoagulants/Anti thrombotics: none  GI Procedures: EGD 2013, unremarkable EGD 07/19/2018 - Roux-en-Y gastrojejunostomy with gastrojejunal anastomosis characterized by healthy appearing mucosa. - Erosive gastritis of the pouch, bile reflux, Biopsied. - Esophagogastric landmarks identified. - Normal gastroesophageal junction and esophagus. DIAGNOSIS:  A. STOMACH; COLD BIOPSY:  - FOCAL MILD REACTIVE GASTRITIS.  - NEGATIVE FOR H. PYLORI, DYSPLASIA, AND MALIGNANCY.   Colonoscopy 07/19/2018 - Preparation of the colon was fair. - Hemorrhoids found on perianal exam. - External hemorrhoids. - Stool in the entire examined colon. - The examination was otherwise normal. - No specimens collected.  Did not undergo colonoscopy Denies family history of GI malignancy  Past Medical History:  Diagnosis Date  . Allergy   . Chronic pain   . Chronic sinusitis   . Depression   . Heart murmur   . HPV (human papilloma virus) anogenital infection   . Migraine   . Psoriatic arthritis [facility](HCC)     Past Surgical History:  Procedure Laterality Date  . BREAST BIOPSY Right 01/28/2018   US guided biopsy - heart shaped  . CHOLECYSTECTOMY  2009  . COLONOSCOPY WITH PROPOFOL N/A 07/19/2018   Procedure: COLONOSCOPY WITH PROPOFOL;  Surgeon: Toney ReilVanga, Khady Vandenberg Reddy, MD;  [facility];  Service: Gastroenterology;  Laterality: N/A;  . ESOPHAGOGASTRODUODENOSCOPY (EGD) WITH PROPOFOL N/A 07/19/2018   Procedure: ESOPHAGOGASTRODUODENOSCOPY (EGD) WITH  PROPOFOL;  Surgeon: Toney ReilVanga, Danyel Tobey Reddy, MD;  [facility];  Service: Gastroenterology;  Laterality: N/A;  . GASTRIC BYPASS     2015; duodenal switch   . LAPAROSCOPIC GASTRIC BANDING  2008    removed  2009  . TUBAL LIGATION  1997     Current Outpatient Medications:  .  Cholecalciferol (VITAMIN D3) 50 MCG (2000 UT) capsule, Take by mouth., Disp: , Rfl:  .  Cholecalciferol 1.25 MG (50000 UT) capsule, Take 1 capsule (50,000 Units total) by mouth once a week., Disp: 13 capsule, Rfl: 1 .  diclofenac sodium (VOLTAREN) 1 % GEL, Apply topically 4 (four) times daily., Disp: , Rfl:  .  Multiple Vitamin (MULTIVITAMIN) tablet, Take 1 tablet by mouth daily., Disp: , Rfl:  .  omeprazole (PRILOSEC) 40 MG capsule, Take 1 capsule (40 mg total) by mouth 2 (two) times daily before a meal., Disp: 180 capsule, Rfl: 0 .  PARoxetine (PAXIL) 40 MG tablet, TAKE 1 TABLET(40 MG) BY MOUTH DAILY, Disp: 90 tablet, Rfl: 1 .  tiZANidine (ZANAFLEX) 4 MG capsule, Take 1 capsule (4 mg total) by mouth at bedtime as needed for muscle spasms (leg cramps)., Disp: 30 capsule, Rfl: 2 .  traMADol (ULTRAM) 50 MG tablet, TAKE 1 TABLET BY MOUTH DAILY TO BID PRN FOR SHOULDER OR ARTHRITIS PAIN, Disp: 10 tablet, Rfl: 0 .  cholestyramine (QUESTRAN) 4 g packet, Take 1 packet (4 g total) by mouth 2 (two) times daily., Disp: 60 packet, Rfl: 0  Family History  Problem Relation Age of Onset  . HIV Father   . Hypertension Mother   . Hyperlipidemia Mother   . Lung cancer Maternal Grandfather        smoker  . Hepatitis Maternal Uncle        drug use  . Colon cancer Neg Hx   . Colon polyps Neg Hx   . Rectal cancer Neg Hx   . Stomach cancer Neg Hx   . Breast cancer Neg Hx      Social History   Tobacco Use  . Smoking status: Former Smoker    Packs/day: 2.00    Years: 20.00    Pack years: 40.00    Types: Cigarettes    Last attempt to quit: 09/02/2003    Years since quitting: 14.9  . Smokeless tobacco: Never Used  Substance Use Topics  . Alcohol use: No    Alcohol/week: 0.0 standard drinks  . Drug use: No    Allergies as of 08/20/2018 - Review Complete 08/20/2018  Allergen Reaction Noted  . Shellfish allergy Swelling  09/27/2010  . Hydrocod polst-cpm polst er Other (See Comments) 09/18/2011    Review of Systems:    All systems reviewed and negative except where noted in HPI.   Physical Exam:  BP 129/86   Pulse 83   Ht 5\' 3"  (1.6 m)   Wt 161 lb 12.8 oz (73.4 kg)   LMP 05/04/2014   BMI 28.66 kg/m  Patient's last menstrual period was 05/04/2014.  General:   Alert,  Well-developed, well-nourished, pleasant and cooperative in NAD Head:  Normocephalic and atraumatic. Eyes:  Sclera clear, no icterus.   Conjunctiva pink. Ears:  Normal auditory acuity. Nose:  No deformity, discharge, or lesions. Mouth:  No deformity or lesions,oropharynx pink & moist. Neck:  Supple; no masses or thyromegaly. Lungs:  Respirations even and unlabored.  Clear throughout to auscultation.   No wheezes, crackles, or rhonchi. No acute distress. Heart:  Regular rate and rhythm; no  murmurs, clicks, rubs, or gallops. Abdomen:  Normal bowel sounds. Soft, non-tender and non-distended without masses, hepatosplenomegaly or hernias noted.  No guarding or rebound tenderness.   Rectal: Nontender, large internal hemorrhoids Msk:  Symmetrical without gross deformities. Good, equal movement & strength bilaterally. Pulses:  Normal pulses noted. Extremities:  No clubbing or edema.  No cyanosis. Neurologic:  Alert and oriented x3;  grossly normal neurologically. Skin:  Intact without significant lesions or rashes. No jaundice. Lymph Nodes:  No significant cervical adenopathy. Psych:  Alert and cooperative. Normal mood and affect.  Imaging Studies: No recent abdominal imaging  Assessment and Plan:   PATRICE MATTHEW is a 52 y.o. 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

## 2018-08-20 NOTE — Progress Notes (Signed)

## 2018-08-24 ENCOUNTER — Ambulatory Visit (INDEPENDENT_AMBULATORY_CARE_PROVIDER_SITE_OTHER): Payer: Federal, State, Local not specified - PPO | Admitting: Internal Medicine

## 2018-08-24 ENCOUNTER — Encounter: Payer: Self-pay | Admitting: Internal Medicine

## 2018-08-24 ENCOUNTER — Other Ambulatory Visit (HOSPITAL_COMMUNITY)
Admission: RE | Admit: 2018-08-24 | Discharge: 2018-08-24 | Disposition: A | Discharge status: Home / Self Care | Payer: Federal, State, Local not specified - PPO | Source: Ambulatory Visit | Attending: Internal Medicine | Admitting: Internal Medicine

## 2018-08-24 VITALS — BP 112/76 | HR 84 | Temp 98.0°F | Ht 63.0 in | Wt 161.6 lb

## 2018-08-24 DIAGNOSIS — N62 Hypertrophy of breast: Secondary | ICD-10-CM

## 2018-08-24 DIAGNOSIS — G8929 Other chronic pain: Secondary | ICD-10-CM | POA: Insufficient documentation

## 2018-08-24 DIAGNOSIS — M25512 Pain in left shoulder: Secondary | ICD-10-CM

## 2018-08-24 DIAGNOSIS — F32A Depression, unspecified: Secondary | ICD-10-CM

## 2018-08-24 DIAGNOSIS — R748 Abnormal levels of other serum enzymes: Secondary | ICD-10-CM

## 2018-08-24 DIAGNOSIS — Z0000 Encounter for general adult medical examination without abnormal findings: Secondary | ICD-10-CM

## 2018-08-24 DIAGNOSIS — F419 Anxiety disorder, unspecified: Secondary | ICD-10-CM

## 2018-08-24 DIAGNOSIS — M7918 Myalgia, other site: Secondary | ICD-10-CM | POA: Insufficient documentation

## 2018-08-24 DIAGNOSIS — G894 Chronic pain syndrome: Secondary | ICD-10-CM | POA: Diagnosis not present

## 2018-08-24 DIAGNOSIS — L4050 Arthropathic psoriasis, unspecified: Secondary | ICD-10-CM | POA: Diagnosis not present

## 2018-08-24 DIAGNOSIS — E559 Vitamin D deficiency, unspecified: Secondary | ICD-10-CM

## 2018-08-24 DIAGNOSIS — Z124 Encounter for screening for malignant neoplasm of cervix: Secondary | ICD-10-CM | POA: Diagnosis not present

## 2018-08-24 DIAGNOSIS — Z1231 Encounter for screening mammogram for malignant neoplasm of breast: Secondary | ICD-10-CM

## 2018-08-24 DIAGNOSIS — D509 Iron deficiency anemia, unspecified: Secondary | ICD-10-CM

## 2018-08-24 DIAGNOSIS — M25511 Pain in right shoulder: Secondary | ICD-10-CM

## 2018-08-24 DIAGNOSIS — F329 Major depressive disorder, single episode, unspecified: Secondary | ICD-10-CM

## 2018-08-24 DIAGNOSIS — K649 Unspecified hemorrhoids: Secondary | ICD-10-CM

## 2018-08-24 LAB — COMPLIANCE DRUG ANALYSIS, UR

## 2018-08-24 MED ORDER — TRAMADOL HCL 50 MG PO TABS: ORAL_TABLET | ORAL | 0 refills | Status: DC

## 2018-08-24 NOTE — Patient Instructions (Addendum)
Replens for vaginal dryness   Atrophic Vaginitis  Atrophic vaginitis is a condition in which the tissues that line the vagina become dry and thin. This condition is most common in women who have stopped having regular menstrual periods (are in menopause). This usually starts when a woman is 50 years old. That is the time when a woman's estrogen levels begin to drop (decrease). Estrogen is a female hormone. It helps to keep the tissues of the vagina moist. It stimulates the vagina to produce a clear fluid that lubricates the vagina for sexual intercourse. This fluid also protects the vagina from infection. Lack of estrogen can cause the lining of the vagina to get thinner and dryer. The vagina may also shrink in size. It may become less elastic. Atrophic vaginitis tends to get worse over time as a woman's estrogen level drops. What are the causes? This condition is caused by the normal drop in estrogen that happens around the time of menopause. What increases the risk? Certain conditions or situations may lower a woman's estrogen level, leading to a higher risk for atrophic vaginitis. You are more likely to develop this condition if:  You are taking medicines that block estrogen.  You have had your ovaries removed.  You are being treated for cancer with X-ray (radiation) or medicines (chemotherapy).  You have given birth or are breastfeeding.  You are older than age 50.  You smoke. What are the signs or symptoms? Symptoms of this condition include:  Pain, soreness, or bleeding during sexual intercourse (dyspareunia).  Vaginal burning, irritation, or itching.  Pain or bleeding when a speculum is used in a vaginal exam (pelvic exam).  Having burning pain when passing urine.  Vaginal discharge that is brown or yellow. In some cases, there are no symptoms. How is this diagnosed? This condition is diagnosed by taking a medical history and doing a physical exam. This will include a  pelvic exam that checks the vaginal tissues. Though rare, you may also have other tests, including:  A urine test.  A test that checks the acid balance in your vagina (acid balance test). How is this treated? Treatment for this condition depends on how severe your symptoms are. Treatment may include:  Using an over-the-counter vaginal lubricant before sex.  Using a long-acting vaginal moisturizer.  Using low-dose vaginal estrogen for moderate to severe symptoms that do not respond to other treatments. Options include creams, tablets, and inserts (vaginal rings). Before you use a vaginal estrogen, tell your health care provider if you have a history of: ? Breast cancer. ? Endometrial cancer. ? Blood clots. If you are not sexually active and your symptoms are very mild, you may not need treatment. Follow these instructions at home: Medicines  Take over-the-counter and prescription medicines only as told by your health care provider. Do not use herbal or alternative medicines unless your health care provider says that you can.  Use over-the-counter creams, lubricants, or moisturizers for dryness only as directed by your health care provider. General instructions  If your atrophic vaginitis is caused by menopause, discuss all of your menopause symptoms and treatment options with your health care provider.  Do not douche.  Do not use products that can make your vagina dry. These include: ? Scented feminine sprays. ? Scented tampons. ? Scented soaps.  Vaginal intercourse can help to improve blood flow and elasticity of vaginal tissue. If it hurts to have sex, try using a lubricant or moisturizer just before having intercourse. Contact  a health care provider if:  Your discharge looks different than normal.  Your vagina has an unusual smell.  You have new symptoms.  Your symptoms do not improve with treatment.  Your symptoms get worse. Summary  Atrophic vaginitis is a  condition in which the tissues that line the vagina become dry and thin. It is most common in women who have stopped having regular menstrual periods (are in menopause).  Treatment options include using vaginal lubricants and low-dose vaginal estrogen.  Contact a health care provider if your vagina has an unusual smell, or if your symptoms get worse or do not improve after treatment. This information is not intended to replace advice given to you by your health care provider. Make sure you discuss any questions you have with your health care provider. Document Released: 01/02/2015 Document Revised: 05/14/2017 Document Reviewed: 05/14/2017 [employer]vier Interactive Patient Education  2019 [employer]

## 2018-08-24 NOTE — Progress Notes (Signed)
Pre visit review using our clinic review tool, if applicable. No additional management support is needed unless otherwise documented below in the visit note. 

## 2018-08-24 NOTE — Progress Notes (Signed)
Chief Complaint  Patient presents with  . Annual Exam   Annual/pap  1. Anxiety and depression improved paxil 40 mg qd and going to [facility]  2. Chronic pain h/o psoriatic arthritis has not heard from Dr. Gean MaidensBeekmons office, saw pain clinic and needs temp refill of tramadol 50 mg qd prn until they will fill  3. Elevated lfts US negative 08/02/18  4.iron def anemia she has appt with h/o 09/02/18 for iron infusion EGD/colonoscopy ext hemorrhoids 07/19/18 and erosive gastritis bx mild gastritis, per pt also hiatal hernia    Review of Systems  Constitutional: Negative for weight loss.  HENT: Negative for hearing loss.   Eyes: Negative for blurred vision.  Respiratory: Negative for shortness of breath.   Cardiovascular: Negative for chest pain.  Gastrointestinal: Negative for abdominal pain.  Musculoskeletal: Positive for joint pain.  Skin: Negative for rash.  Neurological: Negative for headaches.  Psychiatric/Behavioral: Negative for depression.   Past Medical History:  Diagnosis Date  . Allergy   . Chronic pain   . Chronic sinusitis   . Depression   . Heart murmur   . HPV (human papilloma virus) anogenital infection   . Migraine   . Psoriatic arthritis [facility](HCC)    Past Surgical History:  Procedure Laterality Date  . BREAST BIOPSY Right 01/28/2018   US guided biopsy - heart shaped  . CHOLECYSTECTOMY  2009  . COLONOSCOPY WITH PROPOFOL N/A 07/19/2018   Procedure: COLONOSCOPY WITH PROPOFOL;  Surgeon: Toney ReilVanga, Rohini Reddy, MD;  [facility];  Service: Gastroenterology;  Laterality: N/A;  . ESOPHAGOGASTRODUODENOSCOPY (EGD) WITH PROPOFOL N/A 07/19/2018   Procedure: ESOPHAGOGASTRODUODENOSCOPY (EGD) WITH PROPOFOL;  Surgeon: Toney ReilVanga, Rohini Reddy, MD;  [facility];  Service: Gastroenterology;  Laterality: N/A;  . GASTRIC BYPASS     2015; duodenal switch   . LAPAROSCOPIC GASTRIC BANDING  2008    removed 2009  . TUBAL LIGATION  1997   Family History  Problem  Relation Age of Onset  . HIV Father   . Hypertension Mother   . Hyperlipidemia Mother   . Lung cancer Maternal Grandfather        smoker  . Hepatitis Maternal Uncle        drug use  . Colon cancer Neg Hx   . Colon polyps Neg Hx   . Rectal cancer Neg Hx   . Stomach cancer Neg Hx   . Breast cancer Neg Hx    Social History   Socioeconomic History  . Marital status: Married    Spouse name: Not on file  . Number of children: 2  . Years of education: Not on file  . Highest education level: Not on file  Occupational History  . Occupation: Forensic scientistost Office, [employer] rights [employer]    Employer: [employer]  Social Needs  . Financial resource strain: Not on file  . Food insecurity:    Worry: Not on file    Inability: Not on file  . Transportation needs:    Medical: Not on file    Non-medical: Not on file  Tobacco Use  . Smoking status: Former Smoker    Packs/day: 2.00    Years: 20.00    Pack years: 40.00    Types: Cigarettes    Last attempt to quit: 09/02/2003    Years since quitting: 14.9  . Smokeless tobacco: Never Used  Substance and Sexual Activity  . Alcohol use: No    Alcohol/week: 0.0 standard drinks  . Drug use: No  . Sexual  activity: Not on file  Lifestyle  . Physical activity:    Days per week: Not on file    Minutes per session: Not on file  . Stress: Not on file  Relationships  . Social connections:    Talks on phone: Not on file    Gets together: Not on file    Attends religious service: Not on file    Active member of club or organization: Not on file    Attends meetings of clubs or organizations: Not on file    Relationship status: Not on file  . Intimate partner violence:    Fear of current or ex partner: Not on file    Emotionally abused: Not on file    Physically abused: Not on file    Forced sexual activity: Not on file  Other Topics Concern  . Not on file  Social History Narrative   Married.   1 child, 6 grandchildren. 1 son did 2005   Works for the  post office.   Enjoys reading and exercising.    Current Meds  Medication Sig  . Cholecalciferol (VITAMIN D3) 50 MCG (2000 UT) capsule Take by mouth.  . Cholecalciferol 1.25 MG (50000 UT) capsule Take 1 capsule (50,000 Units total) by mouth once a week.  . diclofenac sodium (VOLTAREN) 1 % GEL Apply topically 4 (four) times daily.  . Multiple Vitamin (MULTIVITAMIN) tablet Take 1 tablet by mouth daily.  [name]. omeprazole (PRILOSEC) 40 MG capsule Take 1 capsule (40 mg total) by mouth 2 (two) times daily before a meal.  . PARoxetine (PAXIL) 40 MG tablet TAKE 1 TABLET(40 MG) BY MOUTH DAILY  . tiZANidine (ZANAFLEX) 4 MG capsule Take 1 capsule (4 mg total) by mouth at bedtime as needed for muscle spasms (leg cramps).  . traMADol (ULTRAM) 50 MG tablet TAKE 1 TABLET BY MOUTH DAILY TO BID PRN FOR SHOULDER OR ARTHRITIS PAIN   Allergies  Allergen Reactions  . Shellfish Allergy Swelling    REACTION: throat closing  . Hydrocod Polst-Cpm Polst Er Other (See Comments)    Bad dreams   Recent Results (from the past 2160 hour(s))  Comprehensive metabolic panel     Status: Abnormal   Collection Time: 07/14/18  8:01 AM  Result Value Ref Range   Glucose, Bld 86 65 - 99 mg/dL    Comment: .            Fasting reference interval .    BUN 13 7 - 25 mg/dL   Creat 1.610.86 0.960.50 - 0.451.05 mg/dL    Comment: For patients >50 years of age, the reference limit for Creatinine is approximately 13% higher for people identified as African-American. .    BUN/Creatinine Ratio NOT APPLICABLE 6 - 22 (calc)   Sodium 144 135 - 146 mmol/L   Potassium 4.3 3.5 - 5.3 mmol/L   Chloride 112 (H) 98 - 110 mmol/L   CO2 23 20 - 32 mmol/L   Calcium 8.3 (L) 8.6 - 10.4 mg/dL   Total Protein 6.0 (L) 6.1 - 8.1 g/dL   Albumin 3.9 3.6 - 5.1 g/dL   Globulin 2.1 1.9 - 3.7 g/dL (calc)   AG Ratio 1.9 1.0 - 2.5 (calc)   Total Bilirubin 0.7 0.2 - 1.2 mg/dL   Alkaline phosphatase (APISO) 181 (H) 33 - 130 U/L   AST 74 (H) 10 - 35 U/L   ALT 64  (H) 6 - 29 U/L  CBC with Differential/Platelet     Status: Abnormal  Collection Time: 07/14/18  8:01 AM  Result Value Ref Range   WBC 7.9 3.8 - 10.8 Thousand/uL   RBC 4.20 3.80 - 5.10 Million/uL   Hemoglobin 10.8 (L) 11.7 - 15.5 g/dL   HCT 16.1 (L) 09.6 - 04.5 %   MCV 81.9 80.0 - 100.0 fL   MCH 25.7 (L) 27.0 - 33.0 pg   MCHC 31.4 (L) 32.0 - 36.0 g/dL   RDW 40.9 81.1 - 91.4 %   Platelets 156 140 - 400 Thousand/uL   MPV  7.5 - 12.5 fL    Comment: Due to platelet or RBC variability in size or shape the result cannot be reported accurately.    Neutro Abs 4,977 1,500 - 7,800 cells/uL   Lymphs Abs 2,173 850 - 3,900 cells/uL   WBC mixed population 529 200 - 950 cells/uL   Eosinophils Absolute 198 15 - 500 cells/uL   Basophils Absolute 24 0 - 200 cells/uL   Neutrophils Relative % 63 %   Total Lymphocyte 27.5 %   Monocytes Relative 6.7 %   Eosinophils Relative 2.5 %   Basophils Relative 0.3 %  Lipid panel     Status: Abnormal   Collection Time: 07/14/18  8:01 AM  Result Value Ref Range   Cholesterol 104 <200 mg/dL   HDL 34 (L) >78 mg/dL   Triglycerides 40 <295 mg/dL   LDL Cholesterol (Calc) 59 mg/dL (calc)    Comment: Reference range: <100 . Desirable range <100 mg/dL for primary prevention;   <70 mg/dL for patients with CHD or diabetic patients  with > or = 2 CHD risk factors. [name] LDL-C is now calculated using the Martin-Hopkins  calculation, which is a validated novel method providing  better accuracy than the Friedewald equation in the  estimation of LDL-C.  Horald Pollen et al. Lenox Ahr. 6213;086(57): 2061-2068  (http://education.[facility].com/faq/FAQ164)    Total CHOL/HDL Ratio 3.1 <5.0 (calc)   Non-HDL Cholesterol (Calc) 70 <846 mg/dL (calc)    Comment: For patients with diabetes plus 1 major ASCVD risk  factor, treating to a non-HDL-C goal of <100 mg/dL  (LDL-C of <96 mg/dL) is considered a therapeutic  option.   TSH     Status: None   Collection Time: 07/14/18   8:01 AM  Result Value Ref Range   TSH 0.94 mIU/L    Comment:           Reference Range .           > or = 20 Years  0.40-4.50 .                Pregnancy Ranges           First trimester    0.26-2.66           Second trimester   0.55-2.73           Third trimester    0.43-2.91   VITAMIN D 25 Hydroxy (Vit-D Deficiency, Fractures)     Status: Abnormal   Collection Time: 07/14/18  8:01 AM  Result Value Ref Range   Vit D, 25-Hydroxy 10 (L) 30 - 100 ng/mL    Comment: Vitamin D Status         25-OH Vitamin D: . Deficiency:                    <20 ng/mL Insufficiency:             20 - 29 ng/mL Optimal:                 >  or = 30 ng/mL . For 25-OH Vitamin D testing on patients on  D2-supplementation and patients for whom quantitation  of D2 and D3 fractions is required, the QuestAssureD(TM) 25-OH VIT D, (D2,D3), LC/MS/MS is recommended: order  code 40981 (patients >66yrs). . For more information on this test, go to: http://education.quest[facility].com/faq/FAQ163 (This link is being provided for  informational/educational purposes only.)   B12     Status: None   Collection Time: 07/14/18  8:01 AM  Result Value Ref Range   Vitamin B-12 612 200 - 1,100 pg/mL  Magnesium     Status: None   Collection Time: 07/14/18  8:01 AM  Result Value Ref Range   Magnesium 1.9 1.5 - 2.5 mg/dL  Iron, TIBC and Ferritin Panel     Status: Abnormal   Collection Time: 07/14/18  8:01 AM  Result Value Ref Range   Iron 56 45 - 160 mcg/dL   TIBC 191 478 - 295 mcg/dL (calc)   %SAT 13 (L) 16 - 45 % (calc)   Ferritin 9 (L) 16 - 232 ng/mL  Urinalysis, Routine w reflex microscopic     Status: Abnormal   Collection Time: 07/14/18  8:01 AM  Result Value Ref Range   Specific Gravity, UA 1.022 1.005 - 1.030   pH, UA 5.5 5.0 - 7.5   Color, UA Yellow Yellow   Appearance Ur Cloudy (A) Clear   Leukocytes, UA Negative Negative   Protein, UA Negative Negative/Trace   Glucose, UA Negative Negative   Ketones, UA  Negative Negative   RBC, UA Negative Negative   Bilirubin, UA Negative Negative   Urobilinogen, Ur 0.2 0.2 - 1.0 mg/dL   Nitrite, UA Negative Negative   Microscopic Examination Comment     Comment: Microscopic not indicated and not performed.  Surgical pathology     Status: None   Collection Time: 07/19/18 12:32 PM  Result Value Ref Range   SURGICAL PATHOLOGY      Surgical Pathology CASE: [phone] PATIENT: Chesney Encalade Surgical Pathology Report     SPECIMEN SUBMITTED: A. Stomach, r/o h pylori; cbx  CLINICAL HISTORY: None provided  PRE-OPERATIVE DIAGNOSIS: Abdominal pain, epigastric R10.13, colon cancer screening Z12.11  POST-OPERATIVE DIAGNOSIS: Erosive gastritis; small hiatal hernia; hemorrhoids     DIAGNOSIS: A. STOMACH; COLD BIOPSY: - FOCAL MILD REACTIVE GASTRITIS. - NEGATIVE FOR H. PYLORI, DYSPLASIA, AND MALIGNANCY.   GROSS DESCRIPTION: A. Labeled: Cbx stomach rule out H. pylori Received: In formalin Tissue fragment(s): 4 Size: 0.4-0.5 cm Description: Tan fragments Entirely submitted in one cassette.    Final Diagnosis performed by Elijah Birk, MD.   Electronically signed 07/20/2018 2:58:57PM The electronic signature indicates that the named Attending Pathologist has evaluated the specimen  Technical component performed at [facility], [address] [place], [place] [address] Lab: [phone] Dir:  Jolene Schimke, MD, MMM  Professional component performed at [facility], [facility], [address] [place], [place], [place] [address] Lab: [phone] Dir: Georgiann Cocker. Rubinas, MD    Objective  Body mass index is 28.63 kg/m. Wt Readings from Last 3 Encounters:  08/24/18 161 lb 9.6 oz (73.3 kg)  08/20/18 161 lb 12.8 oz (73.4 kg)  08/19/18 166 lb 6.4 oz (75.5 kg)   Temp Readings from Last 3 Encounters:  08/24/18 98 F (36.7 C) (Oral)  08/19/18 98.3 F (36.8 C) (Oral)  07/19/18 (!) 96.4 F (35.8 C) (Tympanic)   BP Readings from Last 3  Encounters:  08/24/18 112/76  08/20/18 129/86  08/19/18 124/78   Pulse  Readings from Last 3 Encounters:  08/24/18 84  08/20/18 83  08/19/18 82    Physical Exam Vitals signs and nursing note reviewed. Exam conducted with a chaperone present.  Constitutional:      Appearance: Normal appearance. She is normal weight.  HENT:     Head: Normocephalic and atraumatic.     Mouth/Throat:     Mouth: Mucous membranes are moist.     Pharynx: Oropharynx is clear.  Eyes:     Conjunctiva/sclera: Conjunctivae normal.     Pupils: Pupils are equal, round, and reactive to light.  Cardiovascular:     Rate and Rhythm: Normal rate and regular rhythm.     Heart sounds: Normal heart sounds. No murmur.  Pulmonary:     Effort: Pulmonary effort is normal.     Breath sounds: Normal breath sounds.  Chest:     Breasts: Breasts are symmetrical.        Right: Normal. No swelling, bleeding, inverted nipple, mass, nipple discharge, skin change or tenderness.        Left: Normal. No swelling, bleeding, inverted nipple, mass, nipple discharge, skin change or tenderness.     Comments: Large breasts   Genitourinary:    Pubic Area: No rash.      Labia:        Right: No rash, tenderness, lesion or injury.        Left: No rash, tenderness, lesion or injury.      Vagina: Normal.     Cervix: Friability present.     Uterus: Normal.      Adnexa: Right adnexa normal and left adnexa normal.     Rectum: Normal.     Comments: Cervical stenosis  With white d/c  Lymphadenopathy:     Upper Body:     Right upper body: Axillary adenopathy present.     Left upper body: No supraclavicular, axillary or pectoral adenopathy.  Skin:    General: Skin is warm and dry.  Neurological:     General: No focal deficit present.     Mental Status: She is alert and oriented to person, place, and time. Mental status is at baseline.     Gait: Gait normal.  Psychiatric:        Attention and Perception: Attention and perception  normal.        Mood and Affect: Mood and affect normal.        Speech: Speech normal.        Behavior: Behavior normal. Behavior is cooperative.        Thought Content: Thought content normal.        Cognition and Memory: Cognition and memory normal.        Judgment: Judgment normal.     Assessment   1. Annual  2. Anxiety and depression improved  3. Chronic pain h/o psoriatic arthritis 4. Elevated lfts [place] 08/02/18 negative  5. Iron def anemia s/p bypass surgery Plan  1. Had flu shot per pt  Tdap utd  Consider shingrix in future   Pap today check HPV, BV/yeast  Referred mammogram due 01/19/19 nl breast exam right axillary lymph node  EGD/colonoscopy 07/19/18 ext hemorrhoids erosive gastritis bx mild gastritis   Former smoker quit 15 years ago smoked 10-13 years 1ppd  rec healthy diet and exercise  2. Mood doing better likes [facility]  3. Pending rheum referral established with pain clinic  Temp refill tramadol today  4. F/u lfts at f/u w/u neg so far  5. Pending appt iron infusion h/o   Provider: Dr. French Ana McLean-Scocuzza-Internal Medicine

## 2018-08-27 NOTE — Progress Notes (Signed)
[facility]  Telephone:(336) [phone] Fax:(336) [phone]  ID: Barbara ChannelLisa E Faulkner OB: 06/05/1966  MR#: [id number]  UVO#:[id number]#:[id number]  Patient Care Team: McLean-Scocuzza, Pasty Spillersracy N, MD as PCP - General (Internal Medicine)  CHIEF COMPLAINT:  Iron deficiency anemia.  INTERVAL HISTORY: Patient is a 52 year old female with history of gastric bypass surgery who was noted to have a decreased hemoglobin and iron stores.  She currently feels well and is asymptomatic.  She does not complain of weakness or fatigue.  She has no neurologic complaints.  She denies any recent fevers or illnesses.  She has a good appetite and denies weight loss.  She has no chest pain or shortness of breath.  She denies any nausea, vomiting, constipation, or diarrhea.  She denies any melena or hematochezia.  She has no urinary complaints.  Patient feels at her baseline offers no specific complaints today.  REVIEW OF SYSTEMS:   Review of Systems  Constitutional: Negative.  Negative for fever, malaise/fatigue and weight loss.  Respiratory: Negative.  Negative for cough, hemoptysis and shortness of breath.   Cardiovascular: Negative.  Negative for chest pain and leg swelling.  Gastrointestinal: Negative.  Negative for abdominal pain, blood in stool and melena.  Genitourinary: Negative.  Negative for dysuria and hematuria.  Musculoskeletal: Negative.  Negative for back pain.  Skin: Negative.  Negative for rash.  Neurological: Negative.  Negative for focal weakness, weakness and headaches.  Psychiatric/Behavioral: Negative.  The patient is not nervous/anxious.     As per HPI. Otherwise, a complete review of systems is negative.  PAST MEDICAL HISTORY: Past Medical History:  Diagnosis Date  . Allergy   . Chronic pain   . Chronic sinusitis   . Depression   . Heart murmur   . Hiatal hernia   . HPV (human papilloma virus) anogenital infection   . Migraine   . Psoriatic arthritis (HCC)     PAST SURGICAL  HISTORY: Past Surgical History:  Procedure Laterality Date  . BREAST BIOPSY Right 01/28/2018   US guided biopsy - heart shaped  . CHOLECYSTECTOMY  2009  . COLONOSCOPY WITH PROPOFOL N/A 07/19/2018   Procedure: COLONOSCOPY WITH PROPOFOL;  Surgeon: Toney ReilVanga, Rohini Reddy, MD;  [facility];  Service: Gastroenterology;  Laterality: N/A;  . ESOPHAGOGASTRODUODENOSCOPY (EGD) WITH PROPOFOL N/A 07/19/2018   Procedure: ESOPHAGOGASTRODUODENOSCOPY (EGD) WITH PROPOFOL;  Surgeon: Toney ReilVanga, Rohini Reddy, MD;  [facility];  Service: Gastroenterology;  Laterality: N/A;  . GASTRIC BYPASS     2015; duodenal switch   . LAPAROSCOPIC GASTRIC BANDING  2008    removed 2009  . TUBAL LIGATION  1997    FAMILY HISTORY: Family History  Problem Relation Age of Onset  . HIV Father   . Hypertension Mother   . Hyperlipidemia Mother   . Lung cancer Maternal Grandfather        smoker  . Hepatitis Maternal Uncle        drug use  . Colon cancer Neg Hx   . Colon polyps Neg Hx   . Rectal cancer Neg Hx   . Stomach cancer Neg Hx   . Breast cancer Neg Hx     ADVANCED DIRECTIVES (Y/N):  N  HEALTH MAINTENANCE: Social History   Tobacco Use  . Smoking status: Former Smoker    Packs/day: 2.00    Years: 20.00    Pack years: 40.00    Types: Cigarettes    Last attempt to quit: 09/02/2003    Years since quitting: 15.0  . Smokeless  tobacco: Never Used  Substance Use Topics  . Alcohol use: No    Alcohol/week: 0.0 standard drinks  . Drug use: No     Colonoscopy:  PAP:  Bone density:  Lipid panel:  Allergies  Allergen Reactions  . Shellfish Allergy Swelling    REACTION: throat closing  . Hydrocod Polst-Cpm Polst Er Other (See Comments)    Bad dreams    Current Outpatient Medications  Medication Sig Dispense Refill  . Cholecalciferol 1.25 MG (50000 UT) capsule Take 1 capsule (50,000 Units total) by mouth once a week. 13 capsule 1  . diclofenac sodium (VOLTAREN) 1 % GEL Apply topically  4 (four) times daily.    . Multiple Vitamin (MULTIVITAMIN) tablet Take 1 tablet by mouth daily.    [name] omeprazole (PRILOSEC) 40 MG capsule Take 1 capsule (40 mg total) by mouth 2 (two) times daily before a meal. 180 capsule 0  . PARoxetine (PAXIL) 40 MG tablet TAKE 1 TABLET(40 MG) BY MOUTH DAILY 90 tablet 1  . tiZANidine (ZANAFLEX) 4 MG capsule Take 1 capsule (4 mg total) by mouth at bedtime as needed for muscle spasms (leg cramps). 30 capsule 2  . traMADol (ULTRAM) 50 MG tablet TAKE 1 TABLET BY MOUTH DAILY TO BID PRN FOR SHOULDER OR ARTHRITIS PAIN 60 tablet 0   No current facility-administered medications for this visit.     OBJECTIVE: Vitals:   09/02/18 1339  BP: 117/80  Pulse: 89  Temp: 97.6 F (36.4 C)     Body mass index is 28.87 kg/m.    ECOG FS:0 - Asymptomatic  General: Well-developed, well-nourished, no acute distress. Eyes: Pink conjunctiva, anicteric sclera. HEENT: Normocephalic, moist mucous membranes, clear oropharnyx. Lungs: Clear to auscultation bilaterally. Heart: Regular rate and rhythm. No rubs, murmurs, or gallops. Abdomen: Soft, nontender, nondistended. No organomegaly noted, normoactive bowel sounds. Musculoskeletal: No edema, cyanosis, or clubbing. Neuro: Alert, answering all questions appropriately. Cranial nerves grossly intact. Skin: No rashes or petechiae noted. Psych: Normal affect. Lymphatics: No cervical, calvicular, axillary or inguinal LAD.   LAB RESULTS:  Lab Results  Component Value Date   NA 144 07/14/2018   K 4.3 07/14/2018   CL 112 (H) 07/14/2018   CO2 23 07/14/2018   GLUCOSE 86 07/14/2018   BUN 13 07/14/2018   CREATININE 0.86 07/14/2018   CALCIUM 8.3 (L) 07/14/2018   PROT 6.0 (L) 07/14/2018   ALBUMIN 3.9 03/26/2016   AST 74 (H) 07/14/2018   ALT 64 (H) 07/14/2018   ALKPHOS 93 03/26/2016   BILITOT 0.7 07/14/2018   GFRNONAA >60 02/15/2014   GFRAA >60 02/15/2014    Lab Results  Component Value Date   WBC 7.9 07/14/2018    NEUTROABS 4,977 07/14/2018   HGB 10.8 (L) 07/14/2018   HCT 34.4 (L) 07/14/2018   MCV 81.9 07/14/2018   PLT 156 07/14/2018   Lab Results  Component Value Date   IRON 56 07/14/2018   TIBC 416 07/14/2018   IRONPCTSAT 13 (L) 07/14/2018   Lab Results  Component Value Date   FERRITIN 9 (L) 07/14/2018     STUDIES: No results found.  ASSESSMENT: Iron deficiency anemia  PLAN:    1.  Iron deficiency anemia: Likely secondary to poor absorption from history of gastric bypass.  Patient had colonoscopy and endoscopy on July 19, 2018 that did not reveal definitive source of bleeding.  Patient also reports she cannot tolerate oral iron supplementation.  Return to clinic in 1 and 2 weeks to receive 510 mg IV  Feraheme.  Patient will then return to clinic in 3 months with repeat laboratory work, further evaluation, and consideration of additional IV iron.  I spent a total of 45 minutes face-to-face with the patient of which greater than 50% of the visit was spent in counseling and coordination of care as detailed above.   Patient expressed understanding and was in agreement with this plan. She also understands that She can call clinic at any time with any questions, concerns, or complaints.   Cancer Staging No matching staging information was found for the patient.  Jeralyn Ruthsimothy J Bobbijo Holst, MD   09/03/2018 9:12 AM

## 2018-08-31 LAB — CYTOLOGY - PAP
BACTERIAL VAGINITIS: NEGATIVE
Candida vaginitis: NEGATIVE
DIAGNOSIS: REACTIVE
Diagnosis: NEGATIVE
HPV: DETECTED — AB

## 2018-09-02 ENCOUNTER — Inpatient Hospital Stay: Payer: Federal, State, Local not specified - PPO | Attending: Oncology | Admitting: Oncology

## 2018-09-02 ENCOUNTER — Encounter: Payer: Self-pay | Admitting: Oncology

## 2018-09-02 ENCOUNTER — Other Ambulatory Visit: Payer: Self-pay

## 2018-09-02 ENCOUNTER — Encounter: Payer: Self-pay | Admitting: *Deleted

## 2018-09-02 VITALS — BP 117/80 | HR 89 | Temp 97.6°F | Ht 63.0 in | Wt 163.0 lb

## 2018-09-02 DIAGNOSIS — Z87891 Personal history of nicotine dependence: Secondary | ICD-10-CM | POA: Insufficient documentation

## 2018-09-02 DIAGNOSIS — D509 Iron deficiency anemia, unspecified: Secondary | ICD-10-CM | POA: Diagnosis not present

## 2018-09-02 DIAGNOSIS — F329 Major depressive disorder, single episode, unspecified: Secondary | ICD-10-CM

## 2018-09-02 DIAGNOSIS — R011 Cardiac murmur, unspecified: Secondary | ICD-10-CM | POA: Insufficient documentation

## 2018-09-02 DIAGNOSIS — Z9884 Bariatric surgery status: Secondary | ICD-10-CM | POA: Insufficient documentation

## 2018-09-02 DIAGNOSIS — Z79899 Other long term (current) drug therapy: Secondary | ICD-10-CM | POA: Diagnosis not present

## 2018-09-02 DIAGNOSIS — K449 Diaphragmatic hernia without obstruction or gangrene: Secondary | ICD-10-CM

## 2018-09-02 NOTE — Progress Notes (Signed)
Patient is here today to establish care due to having iron deficiency anemia. Patient denied feeling tired and fatigued.

## 2018-09-06 DIAGNOSIS — F411 Generalized anxiety disorder: Secondary | ICD-10-CM | POA: Diagnosis not present

## 2018-09-08 ENCOUNTER — Other Ambulatory Visit: Payer: Self-pay

## 2018-09-08 ENCOUNTER — Encounter: Payer: Self-pay | Admitting: Student in an Organized Health Care Education/Training Program

## 2018-09-08 ENCOUNTER — Ambulatory Visit
Payer: Federal, State, Local not specified - PPO | Attending: Student in an Organized Health Care Education/Training Program | Admitting: Student in an Organized Health Care Education/Training Program

## 2018-09-08 VITALS — BP 92/76 | HR 83 | Temp 98.1°F | Resp 16 | Ht 63.0 in | Wt 160.0 lb

## 2018-09-08 DIAGNOSIS — G8929 Other chronic pain: Secondary | ICD-10-CM | POA: Insufficient documentation

## 2018-09-08 DIAGNOSIS — F329 Major depressive disorder, single episode, unspecified: Secondary | ICD-10-CM

## 2018-09-08 DIAGNOSIS — M2550 Pain in unspecified joint: Secondary | ICD-10-CM | POA: Insufficient documentation

## 2018-09-08 DIAGNOSIS — F419 Anxiety disorder, unspecified: Secondary | ICD-10-CM

## 2018-09-08 DIAGNOSIS — M25552 Pain in left hip: Secondary | ICD-10-CM | POA: Diagnosis not present

## 2018-09-08 DIAGNOSIS — M25562 Pain in left knee: Secondary | ICD-10-CM | POA: Insufficient documentation

## 2018-09-08 DIAGNOSIS — M25512 Pain in left shoulder: Secondary | ICD-10-CM | POA: Diagnosis not present

## 2018-09-08 DIAGNOSIS — M19011 Primary osteoarthritis, right shoulder: Secondary | ICD-10-CM | POA: Insufficient documentation

## 2018-09-08 DIAGNOSIS — M7541 Impingement syndrome of right shoulder: Secondary | ICD-10-CM | POA: Diagnosis not present

## 2018-09-08 DIAGNOSIS — M25561 Pain in right knee: Secondary | ICD-10-CM | POA: Diagnosis not present

## 2018-09-08 DIAGNOSIS — M7531 Calcific tendinitis of right shoulder: Secondary | ICD-10-CM | POA: Diagnosis not present

## 2018-09-08 DIAGNOSIS — M25511 Pain in right shoulder: Secondary | ICD-10-CM | POA: Diagnosis not present

## 2018-09-08 DIAGNOSIS — F32A Depression, unspecified: Secondary | ICD-10-CM

## 2018-09-08 DIAGNOSIS — A6920 Lyme disease, unspecified: Secondary | ICD-10-CM | POA: Diagnosis not present

## 2018-09-08 DIAGNOSIS — M25551 Pain in right hip: Secondary | ICD-10-CM | POA: Diagnosis not present

## 2018-09-08 DIAGNOSIS — L4050 Arthropathic psoriasis, unspecified: Secondary | ICD-10-CM | POA: Diagnosis not present

## 2018-09-08 DIAGNOSIS — Z79899 Other long term (current) drug therapy: Secondary | ICD-10-CM | POA: Insufficient documentation

## 2018-09-08 DIAGNOSIS — M19211 Secondary osteoarthritis, right shoulder: Secondary | ICD-10-CM | POA: Diagnosis not present

## 2018-09-08 DIAGNOSIS — G894 Chronic pain syndrome: Secondary | ICD-10-CM | POA: Diagnosis not present

## 2018-09-08 MED ORDER — TRAMADOL HCL 50 MG PO TABS: 50.0000 mg | ORAL_TABLET | Freq: Three times a day (TID) | ORAL | 2 refills | Status: DC | PRN

## 2018-09-08 MED ORDER — GABAPENTIN 300 MG PO CAPS: 300.0000 mg | ORAL_CAPSULE | Freq: Every day | ORAL | 2 refills | Status: DC

## 2018-09-08 NOTE — Patient Instructions (Signed)
____________________________________________________________________________________________  Medication Rules  Applies to: All patients receiving prescriptions (written or electronic).  Pharmacy of record: Pharmacy where electronic prescriptions will be sent. If written prescriptions are taken to a different pharmacy, please inform the nursing staff. The pharmacy listed in the electronic medical record should be the one where you would like electronic prescriptions to be sent.  Prescription refills: Only during scheduled appointments. Applies to both, written and electronic prescriptions.  NOTE: The following applies primarily to controlled substances (Opioid* Pain Medications).   Patient's responsibilities: 1. Pain Pills: Bring all pain pills to every appointment (except for procedure appointments). 2. Pill Bottles: Bring pills in original pharmacy bottle. Always bring newest bottle. Bring bottle, even if empty. 3. Medication refills: You are responsible for knowing and keeping track of what medications you need refilled. The day before your appointment, write a list of all prescriptions that need to be refilled. Bring that list to your appointment and give it to the admitting nurse. Prescriptions will be written only during appointments. If you forget a medication, it will not be "Called in", "Faxed", or "electronically sent". You will need to get another appointment to get these prescribed. 4. Prescription Accuracy: You are responsible for carefully inspecting your prescriptions before leaving our office. Have the discharge nurse carefully go over each prescription with you, before taking them home. Make sure that your name is accurately spelled, that your address is correct. Check the name and dose of your medication to make sure it is accurate. Check the number of pills, and the written instructions to make sure they are clear and accurate. Make sure that you are given enough medication to last  until your next medication refill appointment. 5. Taking Medication: Take medication as prescribed. Never take more pills than instructed. Never take medication more frequently than prescribed. Taking less pills or less frequently is permitted and encouraged, when it comes to controlled substances (written prescriptions).  6. Inform other Doctors: Always inform, all of your healthcare providers, of all the medications you take. 7. Pain Medication from other Providers: You are not allowed to accept any additional pain medication from any other Doctor or Healthcare provider. There are two exceptions to this rule. (see below) In the event that you require additional pain medication, you are responsible for notifying us, as stated below. 8. Medication Agreement: You are responsible for carefully reading and following our Medication Agreement. This must be signed before receiving any prescriptions from our practice. Safely store a copy of your signed Agreement. Violations to the Agreement will result in no further prescriptions. (Additional copies of our Medication Agreement are available upon request.) 9. Laws, Rules, & Regulations: All patients are expected to follow all Federal and State Laws, Statutes, Rules, & Regulations. Ignorance of the Laws does not constitute a valid excuse. The use of any illegal substances is prohibited. 10. Adopted CDC guidelines & recommendations: Target dosing levels will be at or below 60 MME/day. Use of benzodiazepines** is not recommended.  Exceptions: There are only two exceptions to the rule of not receiving pain medications from other Healthcare Providers. 1. Exception #1 (Emergencies): In the event of an emergency (i.e.: accident requiring emergency care), you are allowed to receive additional pain medication. However, you are responsible for: As soon as you are able, call our office [phone], at any time of the day or night, and leave a message stating your name, the  date and nature of the emergency, and the name and dose of the medication   prescribed. In the event that your call is answered by a member of our staff, make sure to document and save the date, time, and the name of the person that took your information.  2. Exception #2 (Planned Surgery): In the event that you are scheduled by another doctor or dentist to have any type of surgery or procedure, you are allowed (for a period no longer than 30 days), to receive additional pain medication, for the acute post-op pain. However, in this case, you are responsible for picking up a copy of our "Post-op Pain Management for Surgeons" handout, and giving it to your surgeon or dentist. This document is available at our office, and does not require an appointment to obtain it. Simply go to our office during business hours (Monday-Thursday from 8:00 AM to 4:00 PM) (Friday 8:00 AM to 12:00 Noon) or if you have a scheduled appointment with us, prior to your surgery, and ask for it by name. In addition, you will need to provide us with your name, name of your surgeon, type of surgery, and date of procedure or surgery.  *Opioid medications include: morphine, codeine, oxycodone, oxymorphone, hydrocodone, hydromorphone, meperidine, tramadol, tapentadol, buprenorphine, fentanyl, methadone. **Benzodiazepine medications include: diazepam (Valium), alprazolam (Xanax), clonazepam (Klonopine), lorazepam (Ativan), clorazepate (Tranxene), chlordiazepoxide (Librium), estazolam (Prosom), oxazepam (Serax), temazepam (Restoril), triazolam (Halcion) (Last updated: 10/29/2017) ____________________________________________________________________________________________    

## 2018-09-08 NOTE — Progress Notes (Signed)
Patient's Name: Barbara Faulkner  MRN: [id number]  Referring Provider: Orland Mustard *  DOB: April 28, 1966  PCP: McLean-Scocuzza, Nino Glow, MD  DOS: 09/08/2018  Note by: Gillis Santa, MD  Service setting: Ambulatory outpatient  Specialty: Interventional Pain Management  Location: [facility] (AMB) Pain Management Facility    Patient type: Established   Primary Reason(s) for Visit: Encounter for evaluation before starting new chronic pain management plan of care (Level of risk: moderate) CC: right shoulder pain  HPI  Barbara Faulkner is a 53 y.o. year old, female patient, who comes today for a follow-up evaluation to review the test results and decide on a treatment plan. 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 Her primarily concern today is the right shoulder pain  Pain Assessment: Location: Right Shoulder Radiating: Radiated to upper right arm  Onset: More than a month ago Duration: Chronic pain Quality: Constant, Penetrating, Crying, Pounding, Pressure, Heaviness, Sharp Severity: 10-Worst pain ever/10 (subjective, self-reported pain score)  Note: Reported level is inconsistent with clinical observations.                         When using our objective Pain Scale, levels between 6 and 10/10 are said to belong in an emergency room, as it  progressively worsens from a 6/10, described as severely limiting, requiring emergency care not usually available at an outpatient pain management facility. At a 6/10 level, communication becomes difficult and requires great effort. Assistance to reach the emergency department may be required. Facial flushing and profuse sweating along with potentially dangerous increases in heart rate and blood pressure will be evident. Effect on ADL: "Unable to lift arm past shoulders, cannot undress myself, can not use right arm at work, very limiting"  Timing: Constant Modifying factors: Denies BP: 92/76  HR: 83  Barbara Faulkner comes in today for a follow-up visit after her initial evaluation on 08/19/2018. Today we went over the results of her tests. These were explained in "Layman's terms". During today's appointment we went over my diagnostic impression, as well as the proposed treatment plan.  Continues to have significant right shoulder pain that radiates to the right upper arm.  Has been told that she needs right shoulder arthroscopic surgery.  She will complete physical therapy and also dry needling before right shoulder surgery.  Patient's UDS appropriate.  In considering the treatment plan options, Barbara Faulkner was reminded that I no longer take patients for medication management only. I asked her to let me know if she had no intention of taking advantage of the interventional therapies, so that we could make arrangements to provide this space to someone interested. I also made it clear that undergoing interventional therapies for the purpose of getting pain medications is very inappropriate on the part of a patient, and it will not be tolerated in this practice. This type of behavior would suggest true addiction and therefore it requires referral to an addiction specialist.   Further details on both, my assessment(s),  as well as the proposed treatment plan, please see below.  Controlled Substance  Pharmacotherapy Assessment REMS (Risk Evaluation and Mitigation Strategy)  Analgesic: Tramadol 50 mg 3 times daily PRN Pill Count: None expected due to no prior prescriptions written by our practice. Janne Napoleon, RN  09/08/2018  1:34 PM  Sign when Signing Visit Safety precautions to be maintained throughout the outpatient stay will include: orient to surroundings, keep bed in low position, maintain call bell within reach at all times, provide assistance with transfer out of bed and ambulation.    Pharmacokinetics: Liberation and absorption (onset of action): WNL Distribution (time to peak effect): WNL Metabolism and excretion (duration of action): WNL         Pharmacodynamics: Desired effects: Analgesia: Barbara Faulkner reports >50% benefit. Functional ability: Patient reports that medication allows her to accomplish basic ADLs Clinically meaningful improvement in function (CMIF): Sustained CMIF goals met Perceived effectiveness: Described as relatively effective, allowing for increase in activities of daily living (ADL) Undesirable effects: Side-effects or Adverse reactions: None reported Monitoring: [place] PMP: Online review of the past 44-monthperiod previously conducted. Not applicable at this point since we have not taken over the patient's medication management yet. List of other Serum/Urine Drug Screening Test(s):  No results found for: AMPHSCRSER, BARBSCRSER, BENZOSCRSER, COCAINSCRSER, COCAINSCRNUR, PCPSCRSER, THCSCRSER, THCU, CANNABQUANT, [place] of all UDS test(s) done:  Lab Results  Component Value Date   SUMMARY FINAL 08/19/2018   Last UDS on record: Summary  Date Value Ref Range Status  08/19/2018 FINAL  Final    Comment:    ==================================================================== TOXASSURE COMP DRUG ANALYSIS,UR ==================================================================== Test                             Result        Flag       Units Drug Present and Declared for Prescription Verification   Tramadol                       31           EXPECTED   ng/mg creat   N-Desmethyltramadol            66           EXPECTED   ng/mg creat    Source of tramadol is a prescription medication.    N-desmethyltramadol is an expected metabolite of tramadol.   Paroxetine                     PRESENT      EXPECTED Drug Absent but Declared for Prescription Verification   Tizanidine                     Not Detected UNEXPECTED    Tizanidine, as indicated in the declared medication list, is not    always detected even when used as directed.   Diclofenac                     Not Detected UNEXPECTED    Diclofenac, as indicated in the declared medication list, is not    always detected even when used as directed. ==================================================================== Test                      Result    Flag   Units      Ref Range  Creatinine              180              mg/dL      >=20 ==================================================================== Declared Medications:  The flagging and interpretation on this report are based on the  following declared medications.  Unexpected results may arise from  inaccuracies in the declared medications.  **Note: The testing scope of this panel includes these medications:  Paroxetine  Tramadol  **Note: The testing scope of this panel does not include small to  moderate amounts of these reported medications:  Diclofenac  Tizanidine  **Note: The testing scope of this panel does not include following  reported medications:  Cholecalciferol  Cholestyramine  Multivitamin  Omeprazole ==================================================================== For clinical consultation, please call [phone]. ====================================================================    UDS interpretation: No unexpected findings.          Medication Assessment Form: Patient  introduced to form today Treatment compliance: Treatment may start today if patient agrees with proposed plan. Evaluation of compliance is not applicable at this point Risk Assessment Profile: Aberrant behavior: See initial evaluations. None observed or detected today Comorbid factors increasing risk of overdose: See initial evaluation. No additional risks detected today Opioid risk tool (ORT) (Total Score): 0 Personal History of Substance Abuse (SUD-Substance use disorder):  Alcohol: Negative  Illegal Drugs: Negative  Rx Drugs: Negative  ORT Risk Level calculation: Low Risk Risk of substance use disorder (SUD): Low Opioid Risk Tool - 09/08/18 1333      Family History of Substance Abuse   Alcohol  Negative    Illegal Drugs  Negative    Rx Drugs  Negative      Personal History of Substance Abuse   Alcohol  Negative    Illegal Drugs  Negative    Rx Drugs  Negative      Age   Age between 64-45 years   No      History of Preadolescent Sexual Abuse   History of Preadolescent Sexual Abuse  Negative or Female      Psychological Disease   Psychological Disease  Negative    Depression  Negative      Total Score   Opioid Risk Tool Scoring  0    Opioid Risk Interpretation  Low Risk      ORT Scoring interpretation table:  Score <3 = Low Risk for SUD  Score between 4-7 = Moderate Risk for SUD  Score >8 = High Risk for Opioid Abuse   Risk Mitigation Strategies:  Patient opioid safety counseling: Completed today. Counseling provided to patient as per "Patient Counseling Document". Document signed by patient, attesting to counseling and understanding Patient-Prescriber Agreement (PPA): Obtained today.  Controlled substance notification to other providers: Written and sent today.  Pharmacologic Plan: Today we may be taking over the patient's pharmacological regimen. See below.             Laboratory Chemistry  Inflammation Markers (CRP: Acute Phase) (ESR: Chronic Phase) Lab Results   Component Value Date   ESRSEDRATE 9 02/20/2012                         Rheumatology Markers Lab Results  Component Value Date   RF <10 02/20/2012   LABURIC 4.8 10/29/2015                        Renal Function Markers Lab Results  Component Value Date  BUN 13 07/14/2018   CREATININE 0.86 35/59/7416   BCR NOT APPLICABLE 38/45/3646   GFRAA >60 02/15/2014   GFRNONAA >60 02/15/2014                             Hepatic Function Markers Lab Results  Component Value Date   AST 74 (H) 07/14/2018   ALT 64 (H) 07/14/2018   ALBUMIN 3.9 03/26/2016   ALKPHOS 93 03/26/2016   AMYLASE 33 09/25/2014   LIPASE 124 10/04/2013                        Electrolytes Lab Results  Component Value Date   NA 144 07/14/2018   K 4.3 07/14/2018   CL 112 (H) 07/14/2018   CALCIUM 8.3 (L) 07/14/2018   MG 1.9 07/14/2018   PHOS 5.0 (H) 09/25/2014                        Neuropathy Markers Lab Results  Component Value Date   VITAMINB12 612 07/14/2018   FOLATE 7.3 09/25/2014   HGBA1C 5.5 10/04/2013                        CNS Tests No results found for: COLORCSF, APPEARCSF, RBCCOUNTCSF, WBCCSF, POLYSCSF, LYMPHSCSF, EOSCSF, PROTEINCSF, GLUCCSF, JCVIRUS, CSFOLI, IGGCSF                      Bone Pathology Markers Lab Results  Component Value Date   VD25OH 10 (L) 07/14/2018                         Coagulation Parameters Lab Results  Component Value Date   INR 0.9 10/04/2013   LABPROT 12.1 10/04/2013   APTT 30.0 10/04/2013   PLT 156 07/14/2018                        Cardiovascular Markers Lab Results  Component Value Date   CKTOTAL 162 02/20/2012   HGB 10.8 (L) 07/14/2018   HCT 34.4 (L) 07/14/2018                         CA Markers No results found for: CEA, CA125, LABCA2                      Note: Lab results reviewed.  Recent Diagnostic Imaging Review   Shoulder-R MR wo contrast:  Results for orders placed during the hospital encounter of 07/07/18  MR SHOULDER RIGHT WO  CONTRAST   Narrative CLINICAL DATA:  Right shoulder pain for 2 months. No known injury or prior relevant surgery. Decreased range of motion.  EXAM: MRI OF THE RIGHT SHOULDER WITHOUT CONTRAST  TECHNIQUE: Multiplanar, multisequence MR imaging of the shoulder was performed. No intravenous contrast was administered.  COMPARISON:  Radiographs 05/27/2018.  FINDINGS: Rotator cuff: Mild supraspinatus and infraspinatus tendinosis without evidence of tear. In correlation with the prior radiographs, there is probable hydroxyapatite deposition along the bursal surface of the infraspinatus tendon insertion, without surrounding soft tissue edema. The subscapularis and teres minor tendons appear normal.  Muscles:  No focal muscular atrophy or edema.  Biceps long head: Mild tendinosis of the intra-articular portion. The tendon is normally located within the bicipital groove.  Acromioclavicular Joint: The acromion is type  2. There are mild acromioclavicular degenerative changes. No significant fluid is present in the subacromial - subdeltoid bursa.  Glenohumeral Joint: There are age advanced glenohumeral degenerative changes with chondral thinning and prominent subchondral cyst formation throughout the anterior and inferior aspects of the glenoid. There are small humeral head osteophytes. No significant joint effusion or loose body identified.  Labrum: The labrum is diffusely degenerated, especially anteriorly and inferiorly. No paralabral cyst identified.  Bones: No acute or significant extra-articular osseous findings.  Other: No significant soft tissue findings.  IMPRESSION: 1. The primary abnormality is age advanced glenohumeral arthropathy with prominent subchondral cyst formation in the glenoid. 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 2. Associated diffuse labral degeneration. 3. In correlation with the prior radiographs, there is infraspinatus calcific tendinosis, but no evidence of rotator  cuff tear. 4. Mild bicipital tendinosis and mild acromioclavicular degenerative changes.   Electronically Signed   By: Richardean Sale M.D.   On: 07/07/2018 11:10    Shoulder-R DG:  Results for orders placed in visit on 05/27/18  DG Shoulder Right   Narrative CLINICAL DATA:  Right shoulder pain for several weeks.  No injury.  EXAM: RIGHT SHOULDER - 2+ VIEW  COMPARISON:  None.  FINDINGS: No fracture.  No bone lesion.  There is narrowing of the glenohumeral joint with subchondral cystic change. Marginal osteophytes project from the inferior humeral head.  There is narrowing with small marginal osteophytes at the [facility] joint.  Calcification is seen just above the greater tuberosity of the proximal humerus at the superior rotator cuff insertion. Soft tissues otherwise unremarkable.  IMPRESSION: 1. No fracture or dislocation.  No bone lesion. 2. Moderate glenohumeral joint arthropathic changes. Mild AC joint osteoarthritis. 3. Calcification projects above the greater tuberosity consistent with rotator cuff calcific tendinitis.   Electronically Signed   By: Lajean Manes M.D.   On: 05/28/2018 09:04    Hip-R MR w contrast:  Results for orders placed during the hospital encounter of 05/22/15  MR Hip Right W Contrast   Narrative CLINICAL DATA:  Right hip pain for 1 year.  EXAM: MRI OF THE RIGHT HIP WITH CONTRAST(MR Arthrogram)  TECHNIQUE: Multiplanar, multisequence MR imaging of the hip was performed immediately following contrast injection into the hip joint under fluoroscopic guidance. No intravenous contrast was administered.  COMPARISON:  None.  FINDINGS: Bone  No fracture, dislocation or avascular necrosis. Normal sacrum and sacroiliac joints.  Alignment  Normal. No subluxation.  Dysplasia  None.  Joint effusion  Intraarticular contrast.  Labrum  Normal. No labral tear.  Cartilage  Femoral cartilage: Small area of cartilage loss with small  area of subchondral cystic change in the superior medial femoral head.  Acetabular cartilage: Normal.  Capsule and ligaments  Normal.  Muscles and Tendons  Flexors: Normal.  Extensors: Normal.  Abductors: Normal.  Adductors: Normal.  Rotators: Normal.  Hamstrings: Normal.  Other Findings  None  Viscera  Normal. No abnormality seen in pelvis. No lymphadenopathy. No free fluid in the pelvis.  IMPRESSION: 1. No evidence of a stress fracture of the right hip. 2. No right labral tear. 3. Small area of cartilage loss with small area of subchondral cystic change in the superior medial femoral head.   Electronically Signed   By: Kathreen Devoid   On: 05/22/2015 16:46    Hip-R DG 2-3 views:  Results for orders placed during the hospital encounter of 04/18/15  DG HIP UNILAT WITH PELVIS 2-3 VIEWS RIGHT   Narrative CLINICAL DATA:  Pelvic and right  hip pain for 1-1/2 years, no trauma, pain increases with activity  EXAM: DG HIP (WITH OR WITHOUT PELVIS) 2-3V RIGHT  COMPARISON:  None.  FINDINGS: The hip joint spaces appear normal. No significant degenerative change is seen. The pelvic rami are intact. The SI joints are corticated. Faint calcification in the mid lower pelvis probably represents material within bowel.  IMPRESSION: Negative.   Electronically Signed   By: Ivar Drape M.D.   On: 04/18/2015 09:02    Complexity Note: Imaging results reviewed. Results shared with Barbara Faulkner, using Layman's terms.                         Meds   Current Outpatient Medications:  .  Cholecalciferol 1.25 MG (50000 UT) capsule, Take 1 capsule (50,000 Units total) by mouth once a week., Disp: 13 capsule, Rfl: 1 .  diclofenac sodium (VOLTAREN) 1 % GEL, Apply topically 4 (four) times daily., Disp: , Rfl:  .  Multiple Vitamin (MULTIVITAMIN) tablet, Take 1 tablet by mouth daily., Disp: , Rfl:  .  omeprazole (PRILOSEC) 40 MG capsule, Take 1 capsule (40 mg total) by mouth 2  (two) times daily before a meal., Disp: 180 capsule, Rfl: 0 .  PARoxetine (PAXIL) 40 MG tablet, TAKE 1 TABLET(40 MG) BY MOUTH DAILY, Disp: 90 tablet, Rfl: 1 .  tiZANidine (ZANAFLEX) 4 MG capsule, Take 1 capsule (4 mg total) by mouth at bedtime as needed for muscle spasms (leg cramps)., Disp: 30 capsule, Rfl: 2 .  traMADol (ULTRAM) 50 MG tablet, Take 1 tablet (50 mg total) by mouth 3 (three) times daily as needed. FOR SHOULDER OR ARTHRITIS PAIN, Disp: 90 tablet, Rfl: 2 .  gabapentin (NEURONTIN) 300 MG capsule, Take 1 capsule (300 mg total) by mouth at bedtime., Disp: 30 capsule, Rfl: 2  ROS  Constitutional: Denies any fever or chills Gastrointestinal: No reported hemesis, hematochezia, vomiting, or acute GI distress Musculoskeletal: Denies any acute onset joint swelling, redness, loss of ROM, or weakness Neurological: No reported episodes of acute onset apraxia, aphasia, dysarthria, agnosia, amnesia, paralysis, loss of coordination, or loss of consciousness  Allergies  Barbara Faulkner is allergic to shellfish allergy and hydrocod polst-cpm polst er.  [place]  Drug: Barbara Faulkner  reports no history of drug use. Alcohol:  reports no history of alcohol use. Tobacco:  reports that she quit smoking about 15 years ago. Her smoking use included cigarettes. She has a 40.00 pack-year smoking history. She has never used smokeless tobacco. Medical:  has a past medical history of Allergy, Chronic pain, Chronic sinusitis, Depression, Heart murmur, Hiatal hernia, HPV (human papilloma virus) anogenital infection, Migraine, and Psoriatic arthritis ([place]). Surgical: Barbara Faulkner  has a past surgical history that includes Cholecystectomy (2009); Tubal ligation (1997); Laparoscopic gastric banding (2008 ); Gastric bypass; Breast biopsy (Right, 01/28/2018); Colonoscopy with propofol (N/A, 07/19/2018); and Esophagogastroduodenoscopy (egd) with propofol (N/A, 07/19/2018). Family: family history includes HIV in her father;  Hepatitis in her maternal uncle; Hyperlipidemia in her mother; Hypertension in her mother; Lung cancer in her maternal grandfather.  Constitutional Exam  General appearance: Well nourished, well developed, and well hydrated. In no apparent acute distress Vitals:   09/08/18 1326  BP: 92/76  Pulse: 83  Resp: 16  Temp: 98.1 F (36.7 C)  SpO2: 100%  Weight: 160 lb (72.6 kg)  Height: _0  (1.6 m)   BMI Assessment: Estimated body mass index is 28.34 kg/m as calculated from the following:   Height as  of this encounter: _0  (1.6 m).   Weight as of this encounter: 160 lb (72.6 kg).  BMI interpretation table: BMI level Category Range association with higher incidence of chronic pain  <18 kg/m2 Underweight   18.5-24.9 kg/m2 Ideal body weight   25-29.9 kg/m2 Overweight Increased incidence by 20%  30-34.9 kg/m2 Obese (Class I) Increased incidence by 68%  35-39.9 kg/m2 Severe obesity (Class II) Increased incidence by 136%  >40 kg/m2 Extreme obesity (Class III) Increased incidence by 254%   Patient's current BMI Ideal Body weight  Body mass index is 28.34 kg/m. Ideal body weight: 52.4 kg (115 lb 8.3 oz) Adjusted ideal body weight: 60.5 kg (133 lb 5 oz)   BMI Readings from Last 4 Encounters:  09/08/18 28.34 kg/m  09/02/18 28.87 kg/m  08/24/18 28.63 kg/m  08/20/18 28.66 kg/m   Wt Readings from Last 4 Encounters:  09/08/18 160 lb (72.6 kg)  09/02/18 163 lb (73.9 kg)  08/24/18 161 lb 9.6 oz (73.3 kg)  08/20/18 161 lb 12.8 oz (73.4 kg)  Psych/Mental status: Alert, oriented x 3 (person, place, & time)       Eyes: PERLA Respiratory: No evidence of acute respiratory distress  Cervical Spine Area Exam  Skin & Axial Inspection: No masses, redness, edema, swelling, or associated skin lesions Alignment: Symmetrical Functional ROM: Unrestricted ROM      Stability: No instability detected Muscle Tone/Strength: Functionally intact. No obvious neuro-muscular anomalies detected. Sensory  (Neurological): Unimpaired Palpation: No palpable anomalies              Upper Extremity (UE) Exam    Side: Right upper extremity  Side: Left upper extremity  Skin & Extremity Inspection: Skin color, temperature, and hair growth are WNL. No peripheral edema or cyanosis. No masses, redness, swelling, asymmetry, or associated skin lesions. No contractures.  Skin & Extremity Inspection: Skin color, temperature, and hair growth are WNL. No peripheral edema or cyanosis. No masses, redness, swelling, asymmetry, or associated skin lesions. No contractures.  Functional ROM: Decreased ROM for shoulder  Functional ROM: Unrestricted ROM          Muscle Tone/Strength: Functionally intact. No obvious neuro-muscular anomalies detected.  Muscle Tone/Strength: Functionally intact. No obvious neuro-muscular anomalies detected.  Sensory (Neurological): Arthropathic arthralgia affecting the shoulder  Sensory (Neurological): Unimpaired          Palpation: Complains of area being tender to palpation              Palpation: No palpable anomalies              Provocative Test(s):  Phalen's test: deferred Tinel's test: deferred Apley's scratch test (touch opposite shoulder):  Action 1 (Across chest): Decreased ROM Action 2 (Overhead): Decreased ROM Action 3 (LB reach): Decreased ROM   Provocative Test(s):  Phalen's test: deferred Tinel's test: deferred Apley's scratch test (touch opposite shoulder):  Action 1 (Across chest): deferred Action 2 (Overhead): deferred Action 3 (LB reach): deferred    Thoracic Spine Area Exam  Skin & Axial Inspection: No masses, redness, or swelling Alignment: Symmetrical Functional ROM: Unrestricted ROM Stability: No instability detected Muscle Tone/Strength: Functionally intact. No obvious neuro-muscular anomalies detected. Sensory (Neurological): Unimpaired Muscle strength & Tone: No palpable anomalies  Lumbar Spine Area Exam  Skin & Axial Inspection: No masses, redness, or  swelling Alignment: Symmetrical Functional ROM: Unrestricted ROM       Stability: No instability detected Muscle Tone/Strength: Functionally intact. No obvious neuro-muscular anomalies detected. Sensory (Neurological): Unimpaired Palpation: No palpable  anomalies       Provocative Tests: Hyperextension/rotation test: deferred today       Lumbar quadrant test (Kemp's test): deferred today       Lateral bending test: deferred today       Patrick's Maneuver: deferred today                   FABER* test: deferred today                   S-I anterior distraction/compression test: deferred today         S-I lateral compression test: deferred today         S-I Thigh-thrust test: deferred today         S-I Gaenslen's test: deferred today         *(Flexion, ABduction and External Rotation)  Gait & Posture Assessment  Ambulation: Unassisted Gait: Relatively normal for age and body habitus Posture: WNL   Lower Extremity Exam    Side: Right lower extremity  Side: Left lower extremity  Stability: No instability observed          Stability: No instability observed          Skin & Extremity Inspection: Skin color, temperature, and hair growth are WNL. No peripheral edema or cyanosis. No masses, redness, swelling, asymmetry, or associated skin lesions. No contractures.  Skin & Extremity Inspection: Skin color, temperature, and hair growth are WNL. No peripheral edema or cyanosis. No masses, redness, swelling, asymmetry, or associated skin lesions. No contractures.  Functional ROM: Unrestricted ROM                  Functional ROM: Unrestricted ROM                  Muscle Tone/Strength: Functionally intact. No obvious neuro-muscular anomalies detected.  Muscle Tone/Strength: Functionally intact. No obvious neuro-muscular anomalies detected.  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 Diagnoses of Chronic arthralgias of knees and hips, Glenohumeral arthritis, right, Chronic right shoulder pain, Anxiety and depression, Chronic pain syndrome, Arthropathy of right shoulder, Psoriatic arthritis ([place]), Chronic pain of both shoulders, and Controlled substance agreement signed were also pertinent to this visit.  Visit Diagnosis: 1. 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 Chronic arthralgias of knees and hips   3. Glenohumeral arthritis, right   4. Chronic right shoulder pain   5. Anxiety and depression   6. Chronic pain syndrome   7. Arthropathy of right shoulder   8. Psoriatic arthritis ([place])   9. Chronic pain of both shoulders   10. Controlled substance agreement signed    Problems updated and reviewed during this visit: Problem  Joint Pain Due to Lyme Disease  Arthropathy of Right Shoulder  Controlled Substance Agreement Signed    Plan of Care  Pharmacotherapy (Medications Ordered): Meds ordered this encounter  Medications  . traMADol (ULTRAM) 50 MG tablet    Sig: Take 1 tablet (50 mg total) by mouth 3 (three) times daily as needed. FOR SHOULDER OR ARTHRITIS PAIN    Dispense:  90 tablet  Refill:  2    Generic ok  . gabapentin (NEURONTIN) 300 MG capsule    Sig: Take 1 capsule (300 mg total) by mouth at bedtime.    Dispense:  30 capsule    Refill:  2    Do not place this medication, or any other prescription from our practice, on "Automatic Refill". Patient may have prescription filled one day early if pharmacy is closed on scheduled refill date.   Future medication considerations: Cymbalta, Lyrica, buprenorphine  Provider-requested follow-up: Return in about 3 months (around  12/08/2018) for Medication Management.  Future Appointments  Date Time Provider [place]  09/09/2018  1:30 PM [facility]ION CHAIR 7 [facility] None  09/16/2018  1:30 PM [facility] CHAIR 5 [facility] None  09/30/2018  3:30 PM Vanga, Tally Due, MD [facility] None  12/03/2018  1:00 PM [facility] None  12/03/2018  1:15 PM Grayland Ormond, Kathlene November, MD [facility] None  12/03/2018  1:30 PM [facility] CHAIR 3 [facility] None  12/06/2018  1:30 PM Gillis Santa, MD [facility] None  02/24/2019  8:00 AM McLean-Scocuzza, Nino Glow, MD [facility]   Time Note: Greater than 50% of the 25 minute(s) of face-to-face time spent with Barbara Faulkner, was spent in counseling/coordination of care regarding: Barbara Faulkner primary cause of pain, the treatment plan, medication side effects, the opioid analgesic risks and possible complications, realistic expectations, the goals of pain management (increased in functionality), the medication agreement and the patient's responsibilities when it comes to controlled substances.  Primary Care Physician: McLean-Scocuzza, Nino Glow, MD Location: [facility] Note by: Gillis Santa, M.D Date: 09/08/2018; Time: 1:53 PM  Patient Instructions  Medication Rules  Applies to: All patients receiving prescriptions (written or electronic).  Pharmacy of record: Pharmacy where electronic prescriptions will be sent. If written prescriptions are taken to a different pharmacy, please inform the nursing staff. The pharmacy listed in the electronic medical record should be the one where you would like electronic prescriptions to be sent.  Prescription refills: Only during scheduled appointments. Applies to both, written and electronic prescriptions.  NOTE: The following applies primarily to controlled substances (Opioid* Pain Medications).   Patient's responsibilities: 1. Pain Pills: Bring all pain pills to every appointment (except for  procedure appointments). 2. Pill Bottles: Bring pills in original pharmacy bottle. Always bring newest bottle. Bring bottle, even if empty. 3. Medication refills: You are responsible for knowing and keeping track of what medications you need refilled. The day before your appointment, write a list of all prescriptions that need to be refilled. Bring that list to your appointment and give it to the admitting nurse. Prescriptions will be written only during appointments. If you forget a medication, it will not be "Called in", "Faxed", or "electronically sent". You will need to get another appointment to get these prescribed. 4. Prescription Accuracy: You are responsible for carefully inspecting your prescriptions before leaving our office. Have the discharge nurse carefully go over each prescription with you, before taking them home. Make sure that your name is accurately spelled, that your address is correct. Check the name and dose of your medication to make sure it is accurate. Check the number of pills, and the written instructions to make sure they are clear and accurate. Make sure that you are given enough medication to last until your next medication refill appointment. 5. Taking Medication: Take medication as prescribed. Never take more pills than instructed. Never take medication more frequently than prescribed.  Taking less pills or less frequently is permitted and encouraged, when it comes to controlled substances (written prescriptions).  6. Inform other Doctors: Always inform, all of your healthcare providers, of all the medications you take. 7. Pain Medication from other Providers: You are not allowed to accept any additional pain medication from any other Doctor or Healthcare provider. There are two exceptions to this rule. (see below) In the event that you require additional pain medication, you are responsible for notifying us, as stated below. 8. Medication Agreement: You are responsible for  carefully reading and following our Medication Agreement. This must be signed before receiving any prescriptions from our practice. Safely store a copy of your signed Agreement. Violations to the Agreement will result in no further prescriptions. (Additional copies of our Medication Agreement are available upon request.) 9. Laws, Rules, & Regulations: All patients are expected to follow all Federal and [employer], Rules, [employer]. Ignorance of the Laws does not constitute a valid excuse. The use of any illegal substances is prohibited. 10. Adopted CDC guidelines & recommendations: Target dosing levels will be at or below 60 MME/day. Use of benzodiazepines** is not recommended.  Exceptions: There are only two exceptions to the rule of not receiving pain medications from other Healthcare Providers. 1. Exception #1 (Emergencies): In the event of an emergency (i.e.: accident requiring emergency care), you are allowed to receive additional pain medication. However, you are responsible for: As soon as you are able, call our office [phone], at any time of the day or night, and leave a message stating your name, the date and nature of the emergency, and the name and dose of the medication prescribed. In the event that your call is answered by a member of our staff, make sure to document and save the date, time, and the name of the person that took your information.  2. Exception #2 (Planned Surgery): In the event that you are scheduled by another doctor or dentist to have any type of surgery or procedure, you are allowed (for a period no longer than 30 days), to receive additional pain medication, for the acute post-op pain. However, in this case, you are responsible for picking up a copy of our "Post-op Pain Management for Surgeons" handout, and giving it to your surgeon or dentist. This document is available at our office, and does not require an appointment to obtain it. Simply go to our  office during business hours (Monday-Thursday from 8:00 AM to 4:00 PM) (Friday 8:00 AM to 12:00 Noon) or if you have a scheduled appointment with [place], prior to your surgery, and ask for it by name. In addition, you will need to provide [place] with your name, name of your surgeon, type of surgery, and date of procedure or surgery.  *Opioid medications include: morphine, codeine, oxycodone, oxymorphone, hydrocodone, hydromorphone, meperidine, tramadol, tapentadol, buprenorphine, fentanyl, methadone. **Benzodiazepine medications include: diazepam (Valium), alprazolam (Xanax), clonazepam (Klonopine), lorazepam (Ativan), clorazepate (Tranxene), chlordiazepoxide (Librium), estazolam (Prosom), oxazepam (Serax), temazepam (Restoril), triazolam (Halcion) (Last updated: 10/29/2017)

## 2018-09-08 NOTE — Progress Notes (Signed)
Safety precautions to be maintained throughout the outpatient stay will include: orient to surroundings, keep bed in low position, maintain call bell within reach at all times, provide assistance with transfer out of bed and ambulation.  

## 2018-09-09 ENCOUNTER — Inpatient Hospital Stay: Payer: Federal, State, Local not specified - PPO

## 2018-09-09 VITALS — BP 114/72 | HR 78 | Temp 98.2°F | Resp 18

## 2018-09-09 DIAGNOSIS — F329 Major depressive disorder, single episode, unspecified: Secondary | ICD-10-CM | POA: Diagnosis not present

## 2018-09-09 DIAGNOSIS — Z79899 Other long term (current) drug therapy: Secondary | ICD-10-CM | POA: Diagnosis not present

## 2018-09-09 DIAGNOSIS — Z87891 Personal history of nicotine dependence: Secondary | ICD-10-CM | POA: Diagnosis not present

## 2018-09-09 DIAGNOSIS — D509 Iron deficiency anemia, unspecified: Secondary | ICD-10-CM | POA: Diagnosis not present

## 2018-09-09 DIAGNOSIS — Z9884 Bariatric surgery status: Secondary | ICD-10-CM | POA: Diagnosis not present

## 2018-09-09 DIAGNOSIS — K449 Diaphragmatic hernia without obstruction or gangrene: Secondary | ICD-10-CM | POA: Diagnosis not present

## 2018-09-09 DIAGNOSIS — R011 Cardiac murmur, unspecified: Secondary | ICD-10-CM | POA: Diagnosis not present

## 2018-09-09 MED ORDER — SODIUM CHLORIDE 0.9 % IV SOLN
Freq: Once | INTRAVENOUS | Status: AC
  Administered 2018-09-09: 14:00:00 via INTRAVENOUS
  Filled 2018-09-09: qty 250

## 2018-09-09 MED ORDER — SODIUM CHLORIDE 0.9 % IV SOLN
510.0000 mg | Freq: Once | INTRAVENOUS | Status: AC
  Administered 2018-09-09: 510 mg via INTRAVENOUS
  Filled 2018-09-09: qty 17

## 2018-09-13 DIAGNOSIS — M25511 Pain in right shoulder: Secondary | ICD-10-CM | POA: Diagnosis not present

## 2018-09-13 DIAGNOSIS — M542 Cervicalgia: Secondary | ICD-10-CM | POA: Diagnosis not present

## 2018-09-13 DIAGNOSIS — M6281 Muscle weakness (generalized): Secondary | ICD-10-CM | POA: Diagnosis not present

## 2018-09-13 DIAGNOSIS — M25611 Stiffness of right shoulder, not elsewhere classified: Secondary | ICD-10-CM | POA: Diagnosis not present

## 2018-09-16 ENCOUNTER — Inpatient Hospital Stay: Payer: Federal, State, Local not specified - PPO

## 2018-09-16 VITALS — BP 117/77 | HR 77 | Temp 96.8°F | Resp 18

## 2018-09-16 DIAGNOSIS — Z79899 Other long term (current) drug therapy: Secondary | ICD-10-CM | POA: Diagnosis not present

## 2018-09-16 DIAGNOSIS — F329 Major depressive disorder, single episode, unspecified: Secondary | ICD-10-CM | POA: Diagnosis not present

## 2018-09-16 DIAGNOSIS — D509 Iron deficiency anemia, unspecified: Secondary | ICD-10-CM | POA: Diagnosis not present

## 2018-09-16 DIAGNOSIS — R011 Cardiac murmur, unspecified: Secondary | ICD-10-CM | POA: Diagnosis not present

## 2018-09-16 DIAGNOSIS — Z9884 Bariatric surgery status: Secondary | ICD-10-CM | POA: Diagnosis not present

## 2018-09-16 DIAGNOSIS — K449 Diaphragmatic hernia without obstruction or gangrene: Secondary | ICD-10-CM | POA: Diagnosis not present

## 2018-09-16 DIAGNOSIS — Z87891 Personal history of nicotine dependence: Secondary | ICD-10-CM | POA: Diagnosis not present

## 2018-09-16 MED ORDER — SODIUM CHLORIDE 0.9 % IV SOLN
Freq: Once | INTRAVENOUS | Status: AC
  Administered 2018-09-16: 14:00:00 via INTRAVENOUS
  Filled 2018-09-16: qty 250

## 2018-09-16 MED ORDER — SODIUM CHLORIDE 0.9 % IV SOLN
510.0000 mg | Freq: Once | INTRAVENOUS | Status: AC
  Administered 2018-09-16: 510 mg via INTRAVENOUS
  Filled 2018-09-16: qty 17

## 2018-09-20 ENCOUNTER — Encounter: Payer: Self-pay | Admitting: Internal Medicine

## 2018-09-20 DIAGNOSIS — M25611 Stiffness of right shoulder, not elsewhere classified: Secondary | ICD-10-CM | POA: Diagnosis not present

## 2018-09-20 DIAGNOSIS — M542 Cervicalgia: Secondary | ICD-10-CM | POA: Diagnosis not present

## 2018-09-20 DIAGNOSIS — M25511 Pain in right shoulder: Secondary | ICD-10-CM | POA: Diagnosis not present

## 2018-09-20 DIAGNOSIS — M6281 Muscle weakness (generalized): Secondary | ICD-10-CM | POA: Diagnosis not present

## 2018-09-21 DIAGNOSIS — F411 Generalized anxiety disorder: Secondary | ICD-10-CM | POA: Diagnosis not present

## 2018-09-23 DIAGNOSIS — M25611 Stiffness of right shoulder, not elsewhere classified: Secondary | ICD-10-CM | POA: Diagnosis not present

## 2018-09-23 DIAGNOSIS — M25511 Pain in right shoulder: Secondary | ICD-10-CM | POA: Diagnosis not present

## 2018-09-23 DIAGNOSIS — M6281 Muscle weakness (generalized): Secondary | ICD-10-CM | POA: Diagnosis not present

## 2018-09-28 DIAGNOSIS — M25511 Pain in right shoulder: Secondary | ICD-10-CM | POA: Diagnosis not present

## 2018-09-28 DIAGNOSIS — M25611 Stiffness of right shoulder, not elsewhere classified: Secondary | ICD-10-CM | POA: Diagnosis not present

## 2018-09-28 DIAGNOSIS — M6281 Muscle weakness (generalized): Secondary | ICD-10-CM | POA: Diagnosis not present

## 2018-09-30 ENCOUNTER — Encounter: Payer: Self-pay | Admitting: Gastroenterology

## 2018-09-30 ENCOUNTER — Ambulatory Visit: Payer: Federal, State, Local not specified - PPO | Admitting: Gastroenterology

## 2018-09-30 VITALS — BP 137/89 | HR 78 | Resp 17 | Ht 63.0 in | Wt 165.2 lb

## 2018-09-30 DIAGNOSIS — K641 Second degree hemorrhoids: Secondary | ICD-10-CM | POA: Diagnosis not present

## 2018-09-30 NOTE — Progress Notes (Signed)

## 2018-09-30 NOTE — Progress Notes (Signed)
Arlyss Repress, MD [address]  [address]  [place], [place] [address]  Main: [phone]  Fax: [phone]    Gastroenterology Consultation  Referring Provider:     McLean-Scocuzza, French Ana * Primary Care Physician:  McLean-Scocuzza, Pasty Spillers, MD Primary Gastroenterologist:  Dr. Arlyss Repress Reason for Consultation:     Epigastric pain, symptomatic hemorrhoids        HPI:   Barbara Faulkner is a 53 y.o. female referred by Dr. Judie Grieve, Pasty Spillers, MD  for consultation & management of symptomatic hemorrhoids, altered bowel habits, chronic epigastric pain.  Patient had a history of duodenal switch in 01/2014 and lost about 100 pounds  Chronic epigastric pain: Intermittent, sharp in nature, lasts about 3 minutes, sporadic, unpredictable, not associated with nausea or vomiting or abdominal bloating.  Severe intensity.  Occurs about few times a month.  No aggravating or relieving factors.  Patient used to take PPI in the past  Altered bowel habits: Patient had duodenal switch in 01/2014.  Since then, she has been having an obstructive diarrhea, in the beginning, she was having 15 episodes per day, currently having about 6/day.  He about once or twice a month, she has severe constipation associated with hard stool, significant straining.  She uses MiraLAX and suppository which relieves the constipation.  Symptomatic hemorrhoids: She has been experiencing pain, pressure, itching and irritation since her last pregnancy which was 35 years ago.  Currently, her symptoms are manageable but she is interested to undergo hemorrhoid ligation Patient works in the postal office She denies smoking or alcohol use  Follow-up visit 08/20/2018 Patient did not start taking oral iron supplements.  Her EGD was unremarkable.  There was no evidence of anastomotic ulcer.  Gastric biopsies did not reveal Helicobacter pylori.  Started her on omeprazole 40 mg twice daily which helped with epigastric pain.   She now reports hiccups in the end of each meal which last about 5 minutes.  She does feel constipated.  She is not taking MiraLAX and she does not like taking medication.  She is here to discuss about hemorrhoid ligation  NSAIDs: none  Antiplts/Anticoagulants/Anti thrombotics: none  GI Procedures: EGD 2013, unremarkable EGD 07/19/2018 - Roux-en-Y gastrojejunostomy with gastrojejunal anastomosis characterized by healthy appearing mucosa. - Erosive gastritis of the pouch, bile reflux, Biopsied. - Esophagogastric landmarks identified. - Normal gastroesophageal junction and esophagus. DIAGNOSIS:  A. STOMACH; COLD BIOPSY:  - FOCAL MILD REACTIVE GASTRITIS.  - NEGATIVE FOR H. PYLORI, DYSPLASIA, AND MALIGNANCY.   Colonoscopy 07/19/2018 - Preparation of the colon was fair. - Hemorrhoids found on perianal exam. - External hemorrhoids. - Stool in the entire examined colon. - The examination was otherwise normal. - No specimens collected.  Did not undergo colonoscopy Denies family history of GI malignancy  Past Medical History:  Diagnosis Date  . Allergy   . Chronic pain   . Chronic sinusitis   . Depression   . Heart murmur   . Hiatal hernia   . HPV (human papilloma virus) anogenital infection   . Migraine   . Psoriatic arthritis [facility])     Past Surgical History:  Procedure Laterality Date  . BREAST BIOPSY Right 01/28/2018   US guided biopsy - heart shaped  . CHOLECYSTECTOMY  2009  . COLONOSCOPY WITH PROPOFOL N/A 07/19/2018   Procedure: COLONOSCOPY WITH PROPOFOL;  Surgeon: Toney Reil, MD;  Location: [facility] ENDOSCOPY;  Service: Gastroenterology;  Laterality: N/A;  . ESOPHAGOGASTRODUODENOSCOPY (EGD) WITH PROPOFOL N/A 07/19/2018  Procedure: ESOPHAGOGASTRODUODENOSCOPY (EGD) WITH PROPOFOL;  Surgeon: Toney ReilVanga,  Reddy, MD;  Location: [facility] ENDOSCOPY;  Service: Gastroenterology;  Laterality: N/A;  . GASTRIC BYPASS     2015; duodenal switch   . LAPAROSCOPIC GASTRIC BANDING   2008    removed 2009  . TUBAL LIGATION  1997     Current Outpatient Medications:  .  Cholecalciferol 1.25 MG (50000 UT) capsule, Take 1 capsule (50,000 Units total) by mouth once a week., Disp: 13 capsule, Rfl: 1 .  diclofenac sodium (VOLTAREN) 1 % GEL, Apply topically 4 (four) times daily., Disp: , Rfl:  .  gabapentin (NEURONTIN) 300 MG capsule, Take 1 capsule (300 mg total) by mouth at bedtime., Disp: 30 capsule, Rfl: 2 .  Multiple Vitamin (MULTIVITAMIN) tablet, Take 1 tablet by mouth daily., Disp: , Rfl:  .  omeprazole (PRILOSEC) 40 MG capsule, Take 1 capsule (40 mg total) by mouth 2 (two) times daily before a meal., Disp: 180 capsule, Rfl: 0 .  PARoxetine (PAXIL) 40 MG tablet, TAKE 1 TABLET(40 MG) BY MOUTH DAILY, Disp: 90 tablet, Rfl: 1 .  tiZANidine (ZANAFLEX) 4 MG capsule, Take 1 capsule (4 mg total) by mouth at bedtime as needed for muscle spasms (leg cramps)., Disp: 30 capsule, Rfl: 2 .  traMADol (ULTRAM) 50 MG tablet, Take 1 tablet (50 mg total) by mouth 3 (three) times daily as needed. FOR SHOULDER OR ARTHRITIS PAIN, Disp: 90 tablet, Rfl: 2  Family History  Problem Relation Age of Onset  . HIV Father   . Hypertension Mother   . Hyperlipidemia Mother   . Lung cancer Maternal Grandfather        smoker  . Hepatitis Maternal Uncle        drug use  . Colon cancer Neg Hx   . Colon polyps Neg Hx   . Rectal cancer Neg Hx   . Stomach cancer Neg Hx   . Breast cancer Neg Hx      Social History   Tobacco Use  . Smoking status: Former Smoker    Packs/day: 2.00    Years: 20.00    Pack years: 40.00    Types: Cigarettes    Last attempt to quit: 09/02/2003    Years since quitting: 15.0  . Smokeless tobacco: Never Used  Substance Use Topics  . Alcohol use: No    Alcohol/week: 0.0 standard drinks  . Drug use: No    Allergies as of 09/30/2018 - Review Complete 09/30/2018  Allergen Reaction Noted  . Shellfish allergy Swelling 09/27/2010  . Hydrocod polst-cpm polst er Other (See  Comments) 09/18/2011    Review of Systems:    All systems reviewed and negative except where noted in HPI.   Physical Exam:  BP 137/89 (BP Location: Left Arm, Patient Position: Sitting, Cuff Size: Large)   Pulse 78   Resp 17   Ht 5\' 3"  (1.6 m)   Wt 165 lb 3.2 oz (74.9 kg)   LMP 05/04/2014   BMI 29.26 kg/m  Patient's last menstrual period was 05/04/2014.  General:   Alert,  Well-developed, well-nourished, pleasant and cooperative in NAD Head:  Normocephalic and atraumatic. Eyes:  Sclera clear, no icterus.   Conjunctiva pink. Ears:  Normal auditory acuity. Nose:  No deformity, discharge, or lesions. Mouth:  No deformity or lesions,oropharynx pink & moist. Neck:  Supple; no masses or thyromegaly. Lungs:  Respirations even and unlabored.  Clear throughout to auscultation.   No wheezes, crackles, or rhonchi. No acute distress. Heart:  Regular  rate and rhythm; no murmurs, clicks, rubs, or gallops. Abdomen:  Normal bowel sounds. Soft, non-tender and non-distended without masses, hepatosplenomegaly or hernias noted.  No guarding or rebound tenderness.   Rectal: Nontender, large internal hemorrhoids Msk:  Symmetrical without gross deformities. Good, equal movement & strength bilaterally. Pulses:  Normal pulses noted. Extremities:  No clubbing or edema.  No cyanosis. Neurologic:  Alert and oriented x3;  grossly normal neurologically. Skin:  Intact without significant lesions or rashes. No jaundice. Lymph Nodes:  No significant cervical adenopathy. Psych:  Alert and cooperative. Normal mood and affect.  Imaging Studies: No recent abdominal imaging  Assessment and Plan:   Barbara Faulkner is a 53 y.o. 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

## 2018-10-05 DIAGNOSIS — F411 Generalized anxiety disorder: Secondary | ICD-10-CM | POA: Diagnosis not present

## 2018-10-12 DIAGNOSIS — M25611 Stiffness of right shoulder, not elsewhere classified: Secondary | ICD-10-CM | POA: Diagnosis not present

## 2018-10-12 DIAGNOSIS — M6281 Muscle weakness (generalized): Secondary | ICD-10-CM | POA: Diagnosis not present

## 2018-10-12 DIAGNOSIS — M25511 Pain in right shoulder: Secondary | ICD-10-CM | POA: Diagnosis not present

## 2018-10-14 DIAGNOSIS — M25611 Stiffness of right shoulder, not elsewhere classified: Secondary | ICD-10-CM | POA: Diagnosis not present

## 2018-10-14 DIAGNOSIS — M25511 Pain in right shoulder: Secondary | ICD-10-CM | POA: Diagnosis not present

## 2018-10-14 DIAGNOSIS — M6281 Muscle weakness (generalized): Secondary | ICD-10-CM | POA: Diagnosis not present

## 2018-10-18 DIAGNOSIS — M25611 Stiffness of right shoulder, not elsewhere classified: Secondary | ICD-10-CM | POA: Diagnosis not present

## 2018-10-18 DIAGNOSIS — M25511 Pain in right shoulder: Secondary | ICD-10-CM | POA: Diagnosis not present

## 2018-10-18 DIAGNOSIS — M6281 Muscle weakness (generalized): Secondary | ICD-10-CM | POA: Diagnosis not present

## 2018-10-20 DIAGNOSIS — M6281 Muscle weakness (generalized): Secondary | ICD-10-CM | POA: Diagnosis not present

## 2018-10-20 DIAGNOSIS — M25511 Pain in right shoulder: Secondary | ICD-10-CM | POA: Diagnosis not present

## 2018-10-20 DIAGNOSIS — M25611 Stiffness of right shoulder, not elsewhere classified: Secondary | ICD-10-CM | POA: Diagnosis not present

## 2018-10-21 ENCOUNTER — Ambulatory Visit: Payer: Federal, State, Local not specified - PPO | Admitting: Internal Medicine

## 2018-10-21 ENCOUNTER — Encounter: Payer: Self-pay | Admitting: Internal Medicine

## 2018-10-21 ENCOUNTER — Encounter: Payer: Self-pay | Admitting: Gastroenterology

## 2018-10-21 ENCOUNTER — Ambulatory Visit: Payer: Federal, State, Local not specified - PPO | Admitting: Gastroenterology

## 2018-10-21 ENCOUNTER — Other Ambulatory Visit: Payer: Self-pay

## 2018-10-21 VITALS — BP 132/88 | HR 83 | Resp 17 | Ht 63.0 in | Wt 166.4 lb

## 2018-10-21 VITALS — BP 126/80 | HR 83 | Temp 98.1°F | Ht 63.0 in | Wt 164.3 lb

## 2018-10-21 DIAGNOSIS — R05 Cough: Secondary | ICD-10-CM

## 2018-10-21 DIAGNOSIS — R6889 Other general symptoms and signs: Secondary | ICD-10-CM | POA: Diagnosis not present

## 2018-10-21 DIAGNOSIS — K641 Second degree hemorrhoids: Secondary | ICD-10-CM

## 2018-10-21 DIAGNOSIS — J329 Chronic sinusitis, unspecified: Secondary | ICD-10-CM | POA: Diagnosis not present

## 2018-10-21 DIAGNOSIS — R059 Cough, unspecified: Secondary | ICD-10-CM

## 2018-10-21 LAB — POC INFLUENZA A&B (BINAX/QUICKVUE)
Influenza A, POC: NEGATIVE
Influenza B, POC: NEGATIVE

## 2018-10-21 LAB — POCT RAPID STREP A (OFFICE): Rapid Strep A Screen: NEGATIVE

## 2018-10-21 MED ORDER — AZITHROMYCIN 250 MG PO TABS: ORAL_TABLET | ORAL | 0 refills | Status: DC

## 2018-10-21 MED ORDER — FLUTICASONE PROPIONATE 50 MCG/ACT NA SUSP: 2.0000 | Freq: Every day | NASAL | 6 refills | Status: DC

## 2018-10-21 NOTE — Progress Notes (Signed)
Pre visit review using our clinic review tool, if applicable. No additional management support is needed unless otherwise documented below in the visit note. 

## 2018-10-21 NOTE — Patient Instructions (Signed)
Sinusitis, Adult  Sinusitis is inflammation of your sinuses. Sinuses are hollow spaces in the bones around your face. Your sinuses are located:   Around your eyes.   In the middle of your forehead.   Behind your nose.   In your cheekbones.  Mucus normally drains out of your sinuses. When your nasal tissues become inflamed or swollen, mucus can become trapped or blocked. This allows bacteria, viruses, and fungi to grow, which leads to infection. Most infections of the sinuses are caused by a virus.  Sinusitis can develop quickly. It can last for up to 4 weeks (acute) or for more than 12 weeks (chronic). Sinusitis often develops after a cold.  What are the causes?  This condition is caused by anything that creates swelling in the sinuses or stops mucus from draining. This includes:   Allergies.   Asthma.   Infection from bacteria or viruses.   Deformities or blockages in your nose or sinuses.   Abnormal growths in the nose (nasal polyps).   Pollutants, such as chemicals or irritants in the air.   Infection from fungi (rare).  What increases the risk?  You are more likely to develop this condition if you:   Have a weak body defense system (immune system).   Do a lot of swimming or diving.   Overuse nasal sprays.   Smoke.  What are the signs or symptoms?  The main symptoms of this condition are pain and a feeling of pressure around the affected sinuses. Other symptoms include:   Stuffy nose or congestion.   Thick drainage from your nose.   Swelling and warmth over the affected sinuses.   Headache.   Upper toothache.   A cough that may get worse at night.   Extra mucus that collects in the throat or the back of the nose (postnasal drip).   Decreased sense of smell and taste.   Fatigue.   A fever.   Sore throat.   Bad breath.  How is this diagnosed?  This condition is diagnosed based on:   Your symptoms.   Your medical history.   A physical exam.   Tests to find out if your condition is  acute or chronic. This may include:  ? Checking your nose for nasal polyps.  ? Viewing your sinuses using a device that has a light (endoscope).  ? Testing for allergies or bacteria.  ? Imaging tests, such as an MRI or CT scan.  In rare cases, a bone biopsy may be done to rule out more serious types of fungal sinus disease.  How is this treated?  Treatment for sinusitis depends on the cause and whether your condition is chronic or acute.   If caused by a virus, your symptoms should go away on their own within 10 days. You may be given medicines to relieve symptoms. They include:  ? Medicines that shrink swollen nasal passages (topical intranasal decongestants).  ? Medicines that treat allergies (antihistamines).  ? A spray that eases inflammation of the nostrils (topical intranasal corticosteroids).  ? Rinses that help get rid of thick mucus in your nose (nasal saline washes).   If caused by bacteria, your health care provider may recommend waiting to see if your symptoms improve. Most bacterial infections will get better without antibiotic medicine. You may be given antibiotics if you have:  ? A severe infection.  ? A weak immune system.   If caused by narrow nasal passages or nasal polyps, you may need   to have surgery.  Follow these instructions at home:  Medicines   Take, use, or apply over-the-counter and prescription medicines only as told by your health care provider. These may include nasal sprays.   If you were prescribed an antibiotic medicine, take it as told by your health care provider. Do not stop taking the antibiotic even if you start to feel better.  Hydrate and humidify     Drink enough fluid to keep your urine pale yellow. Staying hydrated will help to thin your mucus.   Use a cool mist humidifier to keep the humidity level in your home above 50%.   Inhale steam for 10-15 minutes, 3-4 times a day, or as told by your health care provider. You can do this in the bathroom while a hot shower is  running.   Limit your exposure to cool or dry air.  Rest   Rest as much as possible.   Sleep with your head raised (elevated).   Make sure you get enough sleep each night.  General instructions     Apply a warm, moist washcloth to your face 3-4 times a day or as told by your health care provider. This will help with discomfort.   Wash your hands often with soap and water to reduce your exposure to germs. If soap and water are not available, use hand sanitizer.   Do not smoke. Avoid being around people who are smoking (secondhand smoke).   Keep all follow-up visits as told by your health care provider. This is important.  Contact a health care provider if:   You have a fever.   Your symptoms get worse.   Your symptoms do not improve within 10 days.  Get help right away if:   You have a severe headache.   You have persistent vomiting.   You have severe pain or swelling around your face or eyes.   You have vision problems.   You develop confusion.   Your neck is stiff.   You have trouble breathing.  Summary   Sinusitis is soreness and inflammation of your sinuses. Sinuses are hollow spaces in the bones around your face.   This condition is caused by nasal tissues that become inflamed or swollen. The swelling traps or blocks the flow of mucus. This allows bacteria, viruses, and fungi to grow, which leads to infection.   If you were prescribed an antibiotic medicine, take it as told by your health care provider. Do not stop taking the antibiotic even if you start to feel better.   Keep all follow-up visits as told by your health care provider. This is important.  This information is not intended to replace advice given to you by your health care provider. Make sure you discuss any questions you have with your health care provider.  Document Released: 08/18/2005 Document Revised: 01/18/2018 Document Reviewed: 01/18/2018  [employer] Interactive Patient Education  2019 [employer]

## 2018-10-21 NOTE — Progress Notes (Signed)
Chief Complaint  Patient presents with  . Cough  . URI   Cough runny nose, sore throat and sinus pressure x few days subjective fever 2 days ago tried mucinex, nyquil, dayquil w/o relief flu and strep negative today    Review of Systems  Constitutional: Positive for fever.  HENT: Positive for congestion, sinus pain and sore throat.   Respiratory: Positive for cough. Negative for sputum production and wheezing.   Cardiovascular: Negative for chest pain.   Past Medical History:  Diagnosis Date  . Allergy   . Chronic pain   . Chronic sinusitis   . Depression   . Heart murmur   . Hiatal hernia   . HPV (human papilloma virus) anogenital infection   . Migraine   . Psoriatic arthritis [facility])    Past Surgical History:  Procedure Laterality Date  . BREAST BIOPSY Right 01/28/2018   US guided biopsy - heart shaped  . CHOLECYSTECTOMY  2009  . COLONOSCOPY WITH PROPOFOL N/A 07/19/2018   Procedure: COLONOSCOPY WITH PROPOFOL;  Surgeon: Toney Reil, MD;  Location: [facility] ENDOSCOPY;  Service: Gastroenterology;  Laterality: N/A;  . ESOPHAGOGASTRODUODENOSCOPY (EGD) WITH PROPOFOL N/A 07/19/2018   Procedure: ESOPHAGOGASTRODUODENOSCOPY (EGD) WITH PROPOFOL;  Surgeon: Toney Reil, MD;  Location: [facility] ENDOSCOPY;  Service: Gastroenterology;  Laterality: N/A;  . GASTRIC BYPASS     2015; duodenal switch   . LAPAROSCOPIC GASTRIC BANDING  2008    removed 2009  . TUBAL LIGATION  1997   Family History  Problem Relation Age of Onset  . HIV Father   . Hypertension Mother   . Hyperlipidemia Mother   . Lung cancer Maternal Grandfather        smoker  . Hepatitis Maternal Uncle        drug use  . Colon cancer Neg Hx   . Colon polyps Neg Hx   . Rectal cancer Neg Hx   . Stomach cancer Neg Hx   . Breast cancer Neg Hx    Social History   Socioeconomic History  . Marital status: Married    Spouse name: Not on file  . Number of children: 2  . Years of education: Not on file  . Highest  education level: Not on file  Occupational History  . Occupation: Forensic scientist, Publishing rights manager: [employer]  Social Needs  . Financial resource strain: Not on file  . Food insecurity:    Worry: Not on file    Inability: Not on file  . Transportation needs:    Medical: Not on file    Non-medical: Not on file  Tobacco Use  . Smoking status: Former Smoker    Packs/day: 2.00    Years: 20.00    Pack years: 40.00    Types: Cigarettes    Last attempt to quit: 09/02/2003    Years since quitting: 15.1  . Smokeless tobacco: Never Used  Substance and Sexual Activity  . Alcohol use: No    Alcohol/week: 0.0 standard drinks  . Drug use: No  . Sexual activity: Yes  Lifestyle  . Physical activity:    Days per week: Not on file    Minutes per session: Not on file  . Stress: Not on file  Relationships  . Social connections:    Talks on phone: Not on file    Gets together: Not on file    Attends religious service: Not on file    Active member of club or organization: Not on  file    Attends meetings of clubs or organizations: Not on file    Relationship status: Not on file  . Intimate partner violence:    Fear of current or ex partner: Not on file    Emotionally abused: Not on file    Physically abused: Not on file    Forced sexual activity: Not on file  Other Topics Concern  . Not on file  Social History Narrative   Married.   1 child, 6 grandchildren. 1 son did 2005   Works for the [employer].   Enjoys reading and exercising.    Current Meds  Medication Sig  . Cholecalciferol 1.25 MG (50000 UT) capsule Take 1 capsule (50,000 Units total) by mouth once a week.  . diclofenac sodium (VOLTAREN) 1 % GEL Apply topically 4 (four) times daily.  [name] gabapentin (NEURONTIN) 300 MG capsule Take 1 capsule (300 mg total) by mouth at bedtime.  . Multiple Vitamin (MULTIVITAMIN) tablet Take 1 tablet by mouth daily.  [name] PARoxetine (PAXIL) 40 MG tablet TAKE 1 TABLET(40 MG) BY MOUTH  DAILY  . tiZANidine (ZANAFLEX) 4 MG capsule Take 1 capsule (4 mg total) by mouth at bedtime as needed for muscle spasms (leg cramps).  . traMADol (ULTRAM) 50 MG tablet Take 1 tablet (50 mg total) by mouth 3 (three) times daily as needed. FOR SHOULDER OR ARTHRITIS PAIN   Allergies  Allergen Reactions  . Shellfish Allergy Swelling    REACTION: throat closing  . Hydrocod Polst-Cpm Polst Er Other (See Comments)    Bad dreams   Recent Results (from the past 2160 hour(s))  Compliance Drug Analysis, Ur     Status: None   Collection Time: 08/19/18 10:09 AM  Result Value Ref Range   Summary FINAL     Comment: ==================================================================== TOXASSURE COMP DRUG ANALYSIS,UR ==================================================================== Test                             Result       Flag       Units Drug Present and Declared for Prescription Verification   Tramadol                       31           EXPECTED   ng/mg creat   N-Desmethyltramadol            66           EXPECTED   ng/mg creat    Source of tramadol is a prescription medication.    N-desmethyltramadol is an expected metabolite of tramadol.   Paroxetine                     PRESENT      EXPECTED Drug Absent but Declared for Prescription Verification   Tizanidine                     Not Detected UNEXPECTED    Tizanidine, as indicated in the declared medication list, is not    always detected even when used as directed.   Diclofenac                     Not Detected UNEXPECTED    Diclofenac, as indicated in the declared medication list, is not    always detected even when used as  directed. ==================================================================== Test  Result    Flag   Units      Ref Range   Creatinine              180              mg/dL      >=33 ==================================================================== Declared Medications:  The flagging and  interpretation on this report are based on the  following declared medications.  Unexpected results may arise from  inaccuracies in the declared medications.  **Note: The testing scope of this panel includes these medications:  Paroxetine  Tramadol  **Note: The testing scope of this panel does not include small to  moderate amounts of these reported medications:  Diclofenac  Tizanidine  **Note: The testing scope of this panel does not include following  reported medications:  Cholecalciferol  Cholestyramine  Multivitamin  Omeprazole ==================================================================== For clinical consultation, please call [phone]. = ===================================================================   Cytology - PAP( )     Status: Abnormal   Collection Time: 08/24/18 12:00 AM  Result Value Ref Range   Adequacy      Satisfactory for evaluation  endocervical/transformation zone component PRESENT.   Diagnosis      NEGATIVE FOR INTRAEPITHELIAL LESIONS OR MALIGNANCY.   Diagnosis BENIGN REACTIVE/REPARATIVE CHANGES.    Bacterial vaginitis Negative for Bacterial Vaginitis Microorganisms     Comment: Normal Reference Range - Negative   Candida vaginitis Negative for Candida species     Comment: Normal Reference Range - Negative   HPV DETECTED (A)     Comment: Normal Reference Range - NOT Detected   Material Submitted CervicoVaginal Pap [ThinPrep Imaged]    CYTOLOGY - PAP PAP RESULT   POCT rapid strep A     Status: None   Collection Time: 10/21/18 11:40 AM  Result Value Ref Range   Rapid Strep A Screen Negative Negative  POC Influenza A&B(BINAX/QUICKVUE)     Status: None   Collection Time: 10/21/18 11:57 AM  Result Value Ref Range   Influenza A, POC Negative Negative   Influenza B, POC Negative Negative   Objective  Body mass index is 29.1 kg/m. Wt Readings from Last 3 Encounters:  10/21/18 164 lb 4.8 oz (74.5 kg)  09/30/18 165 lb 3.2 oz  (74.9 kg)  09/08/18 160 lb (72.6 kg)   Temp Readings from Last 3 Encounters:  10/21/18 98.1 F (36.7 C) (Oral)  09/16/18 (!) 96.8 F (36 C) (Tympanic)  09/09/18 98.2 F (36.8 C) (Tympanic)   BP Readings from Last 3 Encounters:  10/21/18 126/80  09/30/18 137/89  09/16/18 117/77   Pulse Readings from Last 3 Encounters:  10/21/18 83  09/30/18 78  09/16/18 77    Physical Exam Vitals signs and nursing note reviewed.  Constitutional:      Appearance: Normal appearance. She is well-developed and well-groomed.  HENT:     Head: Normocephalic and atraumatic.     Nose: Nose normal.     Mouth/Throat:     Mouth: Mucous membranes are moist.     Pharynx: Oropharynx is clear.  Eyes:     Conjunctiva/sclera: Conjunctivae normal.     Pupils: Pupils are equal, round, and reactive to light.  Cardiovascular:     Rate and Rhythm: Normal rate and regular rhythm.     Heart sounds: Normal heart sounds.  Pulmonary:     Effort: Pulmonary effort is normal.     Breath sounds: Normal breath sounds. No wheezing.  Neurological:     General: No focal deficit  present.     Mental Status: She is alert and oriented to person, place, and time. Mental status is at baseline.     Gait: Gait normal.  Psychiatric:        Attention and Perception: Attention and perception normal.        Mood and Affect: Mood and affect normal.        Speech: Speech normal.        Behavior: Behavior normal. Behavior is cooperative.        Thought Content: Thought content normal.        Cognition and Memory: Cognition and memory normal.        Judgment: Judgment normal.     Assessment   1. Sinusitis with sore throat flu and strep neg today Plan   1. Supportive care  Zpack, ns, flonase  rtc if not better  Cont mucinex dm or robitussin dm    Provider: Dr. French Anaracy McLean-Scocuzza-Internal Medicine

## 2018-10-21 NOTE — Progress Notes (Signed)
PROCEDURE NOTE: The patient presents with symptomatic grade 2 hemorrhoids, unresponsive to maximal medical therapy, requesting rubber band ligation of his/her hemorrhoidal disease.  All risks, benefits and alternative forms of therapy were described and informed consent was obtained.  In the Left Lateral Decubitus position (if anoscopy is performed) anoscopic examination revealed grade 2 hemorrhoids in the all position(s).   The decision was made to band the LL internal hemorrhoid, and the [facility] O'Regan System was used to perform band ligation without complication.  Digital anorectal examination was then performed to assure proper positioning of the band, and to adjust the banded tissue as required.  The patient was discharged home without pain or other issues.  Dietary and behavioral recommendations were given and (if necessary - prescriptions were given), along with follow-up instructions.  The patient will return as needed for follow-up and possible additional banding as required.  No complications were encountered and the patient tolerated the procedure well.  

## 2018-10-27 DIAGNOSIS — M25611 Stiffness of right shoulder, not elsewhere classified: Secondary | ICD-10-CM | POA: Diagnosis not present

## 2018-10-27 DIAGNOSIS — M6281 Muscle weakness (generalized): Secondary | ICD-10-CM | POA: Diagnosis not present

## 2018-10-27 DIAGNOSIS — M25511 Pain in right shoulder: Secondary | ICD-10-CM | POA: Diagnosis not present

## 2018-10-28 ENCOUNTER — Telehealth: Payer: Self-pay | Admitting: Internal Medicine

## 2018-10-28 NOTE — Telephone Encounter (Signed)
Pt was seen on 2/20 by Dr. Shirlee Latch. Pt called office on 10/26/2018 stating she still does not feel well. Pt still has nasal congestion, sinus headache and a little sore throat. Pt would like to know what to do next. Please call her at [phone].

## 2018-10-29 ENCOUNTER — Other Ambulatory Visit: Payer: Self-pay | Admitting: Internal Medicine

## 2018-10-29 DIAGNOSIS — M25611 Stiffness of right shoulder, not elsewhere classified: Secondary | ICD-10-CM | POA: Diagnosis not present

## 2018-10-29 DIAGNOSIS — M25511 Pain in right shoulder: Secondary | ICD-10-CM | POA: Diagnosis not present

## 2018-10-29 DIAGNOSIS — M6281 Muscle weakness (generalized): Secondary | ICD-10-CM | POA: Diagnosis not present

## 2018-10-29 DIAGNOSIS — J029 Acute pharyngitis, unspecified: Secondary | ICD-10-CM

## 2018-10-29 MED ORDER — AMOXICILLIN-POT CLAVULANATE 875-125 MG PO TABS: 1.0000 | ORAL_TABLET | Freq: Two times a day (BID) | ORAL | 0 refills | Status: DC

## 2018-10-29 NOTE — Telephone Encounter (Signed)
Call patient we can try Augmentin sent to pharmacy please pick up  Flonase and nasal saline  As needed Tylenol   TMS

## 2018-11-01 DIAGNOSIS — M25511 Pain in right shoulder: Secondary | ICD-10-CM | POA: Diagnosis not present

## 2018-11-01 DIAGNOSIS — M6281 Muscle weakness (generalized): Secondary | ICD-10-CM | POA: Diagnosis not present

## 2018-11-01 DIAGNOSIS — M25611 Stiffness of right shoulder, not elsewhere classified: Secondary | ICD-10-CM | POA: Diagnosis not present

## 2018-11-01 NOTE — Telephone Encounter (Signed)
Left message for patient to return call back. PEC may give information.  

## 2018-11-03 DIAGNOSIS — M25511 Pain in right shoulder: Secondary | ICD-10-CM | POA: Diagnosis not present

## 2018-11-03 DIAGNOSIS — M25611 Stiffness of right shoulder, not elsewhere classified: Secondary | ICD-10-CM | POA: Diagnosis not present

## 2018-11-03 DIAGNOSIS — M6281 Muscle weakness (generalized): Secondary | ICD-10-CM | POA: Diagnosis not present

## 2018-11-17 DIAGNOSIS — M6281 Muscle weakness (generalized): Secondary | ICD-10-CM | POA: Diagnosis not present

## 2018-11-17 DIAGNOSIS — M25611 Stiffness of right shoulder, not elsewhere classified: Secondary | ICD-10-CM | POA: Diagnosis not present

## 2018-11-17 DIAGNOSIS — M25511 Pain in right shoulder: Secondary | ICD-10-CM | POA: Diagnosis not present

## 2018-11-19 DIAGNOSIS — M6281 Muscle weakness (generalized): Secondary | ICD-10-CM | POA: Diagnosis not present

## 2018-11-19 DIAGNOSIS — M25611 Stiffness of right shoulder, not elsewhere classified: Secondary | ICD-10-CM | POA: Diagnosis not present

## 2018-11-19 DIAGNOSIS — M25511 Pain in right shoulder: Secondary | ICD-10-CM | POA: Diagnosis not present

## 2018-11-22 DIAGNOSIS — M7541 Impingement syndrome of right shoulder: Secondary | ICD-10-CM | POA: Diagnosis not present

## 2018-11-22 DIAGNOSIS — G8929 Other chronic pain: Secondary | ICD-10-CM | POA: Diagnosis not present

## 2018-11-22 DIAGNOSIS — M7531 Calcific tendinitis of right shoulder: Secondary | ICD-10-CM | POA: Diagnosis not present

## 2018-11-22 DIAGNOSIS — M19211 Secondary osteoarthritis, right shoulder: Secondary | ICD-10-CM | POA: Diagnosis not present

## 2018-11-22 DIAGNOSIS — M25511 Pain in right shoulder: Secondary | ICD-10-CM | POA: Diagnosis not present

## 2018-12-03 ENCOUNTER — Ambulatory Visit: Payer: Federal, State, Local not specified - PPO

## 2018-12-03 ENCOUNTER — Other Ambulatory Visit: Payer: Federal, State, Local not specified - PPO

## 2018-12-03 ENCOUNTER — Ambulatory Visit: Payer: Federal, State, Local not specified - PPO | Admitting: Oncology

## 2018-12-06 ENCOUNTER — Other Ambulatory Visit: Payer: Self-pay

## 2018-12-06 ENCOUNTER — Ambulatory Visit
Payer: Federal, State, Local not specified - PPO | Attending: Student in an Organized Health Care Education/Training Program | Admitting: Student in an Organized Health Care Education/Training Program

## 2018-12-06 DIAGNOSIS — A6920 Lyme disease, unspecified: Secondary | ICD-10-CM

## 2018-12-06 DIAGNOSIS — F32A Depression, unspecified: Secondary | ICD-10-CM

## 2018-12-06 DIAGNOSIS — M25551 Pain in right hip: Secondary | ICD-10-CM | POA: Diagnosis not present

## 2018-12-06 DIAGNOSIS — M25511 Pain in right shoulder: Secondary | ICD-10-CM | POA: Diagnosis not present

## 2018-12-06 DIAGNOSIS — K2960 Other gastritis without bleeding: Secondary | ICD-10-CM

## 2018-12-06 DIAGNOSIS — M25562 Pain in left knee: Secondary | ICD-10-CM

## 2018-12-06 DIAGNOSIS — M2550 Pain in unspecified joint: Secondary | ICD-10-CM

## 2018-12-06 DIAGNOSIS — F329 Major depressive disorder, single episode, unspecified: Secondary | ICD-10-CM

## 2018-12-06 DIAGNOSIS — M19011 Primary osteoarthritis, right shoulder: Secondary | ICD-10-CM | POA: Diagnosis not present

## 2018-12-06 DIAGNOSIS — F419 Anxiety disorder, unspecified: Secondary | ICD-10-CM

## 2018-12-06 DIAGNOSIS — M25561 Pain in right knee: Secondary | ICD-10-CM

## 2018-12-06 DIAGNOSIS — G894 Chronic pain syndrome: Secondary | ICD-10-CM

## 2018-12-06 DIAGNOSIS — M25552 Pain in left hip: Secondary | ICD-10-CM

## 2018-12-06 DIAGNOSIS — T39395A Adverse effect of other nonsteroidal anti-inflammatory drugs [NSAID], initial encounter: Secondary | ICD-10-CM

## 2018-12-06 DIAGNOSIS — M25512 Pain in left shoulder: Secondary | ICD-10-CM

## 2018-12-06 DIAGNOSIS — G8929 Other chronic pain: Secondary | ICD-10-CM

## 2018-12-06 MED ORDER — TIZANIDINE HCL 4 MG PO CAPS: 4.0000 mg | ORAL_CAPSULE | Freq: Every evening | ORAL | 2 refills | Status: DC | PRN

## 2018-12-06 NOTE — Progress Notes (Signed)
Pain Management Encounter Note - Virtual Visit via Telephone Telehealth (real-time audio visits between healthcare provider and patient).  Patient's Phone No. & Preferred Pharmacy:  [phone] (home); [phone] (mobile); (Preferred) [phone]  [id number] [place], [place] - [address] AT [facility] OF [address] [place] [address] Phone: [phone] Fax: [phone]   Pre-screening note:  Our staff contacted Ms. Swider and offered her an "in person", "face-to-face" appointment versus a telephone encounter. She indicated preferring the telephone encounter, at this time.  Reason for Virtual Visit: COVID-19*  Social distancing based on CDC and AMA recommendations.   I contacted TIMI PLUFF on 12/06/2018 at 9:20 AM by telephone and clearly identified myself as Edward Jolly, MD. I verified that I was speaking with the correct person using two identifiers (Name and date of birth: Oct 31, 1965).  Advanced Informed Consent I sought verbal advanced consent from [name] for telemedicine interactions and virtual visit. I informed Ms. Richwine of the security and privacy concerns, risks, and limitations associated with performing an evaluation and management service by telephone. I also informed Ms. Radke of the availability of "in person" appointments and I informed her of the possibility of a patient responsible charge related to this service. Ms. Lade expressed understanding and agreed to proceed.   Historic Elements   Barbara Faulkner is a 53 y.o. year old, female patient evaluated today after her last encounter by our practice on 09/08/2018. Ms. Musleh  has a past medical history of Allergy, Chronic pain, Chronic sinusitis, Depression, Heart murmur, Hiatal hernia, HPV (human papilloma virus) anogenital infection, Migraine, and Psoriatic arthritis (HCC). She also  has a past surgical history that includes Cholecystectomy (2009);  Tubal ligation (1997); Laparoscopic gastric banding (2008 ); Gastric bypass; Breast biopsy (Right, 01/28/2018); Colonoscopy with propofol (N/A, 07/19/2018); and Esophagogastroduodenoscopy (egd) with propofol (N/A, 07/19/2018). Ms. Dembski has a current medication list which includes the following prescription(s): amoxicillin-clavulanate, azithromycin, cholecalciferol, diclofenac sodium, fluticasone, gabapentin, multivitamin, omeprazole, paroxetine, tizanidine, and tramadol. She  reports that she quit smoking about 15 years ago. Her smoking use included cigarettes. She has a 40.00 pack-year smoking history. She has never used smokeless tobacco. She reports that she does not drink alcohol or use drugs. Ms. Piltz is allergic to shellfish allergy and hydrocod polst-cpm polst er.   HPI  I last saw her on 09/08/2018. She is being evaluated for medication management.  No changes in medical history since my last visit with her on 09/08/2018.  Patient did see Dr. Landry Mellow given worsening right shoulder pain, received a right glenohumeral shoulder steroid injection via ultrasound guidance.  Patient states that the injection was only helpful for 1 day and that she was back to baseline pain day after.  When discussing medication refills with the patient, she still has 2 refills of tramadol remaining at the pharmacy.  Patient just refilled her gabapentin.  She states that she will need a refill of her tizanidine which I will refill as below.    Pharmacotherapy Assessment   10/10/2018  1   09/08/2018  Tramadol Hcl 50 MG Tablet  90.00 30 Bi Lat   [id number]   Wal ([id number])   0  15.00 MME  Comm Ins   [place]     Monitoring: Pharmacotherapy: No side-effects or adverse reactions reported.  PMP: PDMP reviewed during this encounter.       Compliance: No problems identified. Plan: Refer to "POC".  Review of  recent tests  US Abdomen Complete CLINICAL DATA:  53 year old female with elevated LFTs.  EXAM: ABDOMEN ULTRASOUND  COMPLETE  COMPARISON:  CT Abdomen and Pelvis 12/12/2011.  FINDINGS: Gallbladder: Surgically absent as in 2013.  Common bile duct: Diameter: 6-7 millimeters, within normal limits.  Liver: Liver echogenicity at the upper limits of normal. No discrete liver lesion or intrahepatic biliary ductal dilatation. Portal vein is patent on color Doppler imaging with normal direction of blood flow towards the liver.  IVC: No abnormality visualized.  Pancreas: Visualized portion unremarkable.  Spleen: Size and appearance within normal limits.  Right Kidney: Length: 11.1 centimeters. Chronic right renal midpole simple appearing cyst (image 62) measuring 22 millimeters is stable since 2013. Echogenicity within normal limits. No solid mass or hydronephrosis visualized.  Left Kidney: Length: 11.2 centimeters. Echogenicity within normal limits. No mass or hydronephrosis visualized.  Abdominal aorta: No aneurysm visualized.  Other findings: None.  IMPRESSION: 1. Negative ultrasound appearance of the liver status post cholecystectomy. 2. No acute findings in the abdomen.  Electronically Signed   By: Odessa FlemingH  Hall M.D.   On: 08/02/2018 14:01   Office Visit on 10/21/2018  Component Date Value Ref Range Status  . Rapid Strep A Screen 10/21/2018 Negative  Negative Final  . Influenza A, POC 10/21/2018 Negative  Negative Final  . 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  Plan of Care  I am having Elroy ChannelLisa E. Bosak maintain her multivitamin, PARoxetine, omeprazole, Cholecalciferol, diclofenac sodium,  traMADol, gabapentin, fluticasone, azithromycin, amoxicillin-clavulanate, and tiZANidine.  No changes in medical history since my last visit with her on 09/08/2018.  Patient did see Dr. Landry MellowKubinski given worsening right shoulder pain, received a right glenohumeral shoulder steroid injection via ultrasound guidance.  Patient states that the injection was only helpful for 1 day and that she was back to baseline pain day after.  When discussing medication refills with the patient, she still has 2 refills of tramadol remaining at the pharmacy.  Patient just refilled her gabapentin.  She states that she will need a refill of her tizanidine which I will refill as below.  Pharmacotherapy (Medications Ordered): Meds ordered this encounter  Medications  . tiZANidine (ZANAFLEX) 4 MG capsule    Sig: Take 1 capsule (4 mg total) by mouth at bedtime as needed for muscle spasms (leg cramps).    Dispense:  30 capsule    Refill:  2    Do not place this medication, or any other prescription from our practice, on "Automatic Refill". Patient may have prescription filled one day early if pharmacy is closed on scheduled refill date.   Follow-up plan:   Return in about 8 weeks (around 01/31/2019) for Medication Management.   I discussed the assessment and treatment plan with the patient. The patient was provided an opportunity to ask questions and all were answered. The patient agreed with the plan and demonstrated an understanding of the instructions.  Patient advised to call back or seek an in-person evaluation if the symptoms or condition worsens.  Total duration of non-face-to-face encounter: 12 minutes.  Note by: Edward JollyBilal Demondre Aguas, MD Date: 12/06/2018; Time: 9:20 AM  Disclaimer:  * Given  the special circumstances of the COVID-19 pandemic, the federal government has announced that the Office for Civil Rights (OCR) will exercise its enforcement discretion and will not impose penalties on physicians using telehealth in the  event of noncompliance with regulatory requirements under the [employer] Portability and Accountability Act (HIPAA) in connection with the good faith provision of telehealth during the COVID-19 national public health emergency. (AMA)

## 2019-01-04 ENCOUNTER — Inpatient Hospital Stay: Payer: Federal, State, Local not specified - PPO | Admitting: Oncology

## 2019-01-04 ENCOUNTER — Inpatient Hospital Stay: Payer: Federal, State, Local not specified - PPO

## 2019-01-25 ENCOUNTER — Telehealth: Payer: Self-pay | Admitting: Internal Medicine

## 2019-01-25 ENCOUNTER — Other Ambulatory Visit: Payer: Self-pay | Admitting: Internal Medicine

## 2019-01-25 DIAGNOSIS — F329 Major depressive disorder, single episode, unspecified: Secondary | ICD-10-CM

## 2019-01-25 DIAGNOSIS — F419 Anxiety disorder, unspecified: Secondary | ICD-10-CM

## 2019-01-25 DIAGNOSIS — F32A Depression, unspecified: Secondary | ICD-10-CM

## 2019-01-25 MED ORDER — PAROXETINE HCL 40 MG PO TABS: ORAL_TABLET | ORAL | 3 refills | Status: DC

## 2019-01-25 NOTE — Telephone Encounter (Signed)
Patient has been informed.

## 2019-01-25 NOTE — Telephone Encounter (Signed)
She can stop vitamin D3 1x per week and start otc vitamin D3 5000 IU daily   TMS

## 2019-01-27 ENCOUNTER — Encounter: Payer: Self-pay | Admitting: Student in an Organized Health Care Education/Training Program

## 2019-01-27 ENCOUNTER — Other Ambulatory Visit: Payer: Self-pay

## 2019-01-27 ENCOUNTER — Ambulatory Visit
Payer: Federal, State, Local not specified - PPO | Attending: Student in an Organized Health Care Education/Training Program | Admitting: Student in an Organized Health Care Education/Training Program

## 2019-01-27 DIAGNOSIS — F32A Depression, unspecified: Secondary | ICD-10-CM

## 2019-01-27 DIAGNOSIS — F329 Major depressive disorder, single episode, unspecified: Secondary | ICD-10-CM

## 2019-01-27 DIAGNOSIS — A6920 Lyme disease, unspecified: Secondary | ICD-10-CM | POA: Diagnosis not present

## 2019-01-27 DIAGNOSIS — G8929 Other chronic pain: Secondary | ICD-10-CM

## 2019-01-27 DIAGNOSIS — M25551 Pain in right hip: Secondary | ICD-10-CM

## 2019-01-27 DIAGNOSIS — M19011 Primary osteoarthritis, right shoulder: Secondary | ICD-10-CM | POA: Diagnosis not present

## 2019-01-27 DIAGNOSIS — M25511 Pain in right shoulder: Secondary | ICD-10-CM

## 2019-01-27 DIAGNOSIS — F419 Anxiety disorder, unspecified: Secondary | ICD-10-CM

## 2019-01-27 DIAGNOSIS — G894 Chronic pain syndrome: Secondary | ICD-10-CM

## 2019-01-27 DIAGNOSIS — M2550 Pain in unspecified joint: Secondary | ICD-10-CM

## 2019-01-27 DIAGNOSIS — M25561 Pain in right knee: Secondary | ICD-10-CM

## 2019-01-27 DIAGNOSIS — M25562 Pain in left knee: Secondary | ICD-10-CM

## 2019-01-27 DIAGNOSIS — M25552 Pain in left hip: Secondary | ICD-10-CM

## 2019-01-27 MED ORDER — TIZANIDINE HCL 4 MG PO CAPS: 4.0000 mg | ORAL_CAPSULE | Freq: Every evening | ORAL | 2 refills | Status: DC | PRN

## 2019-01-27 NOTE — Progress Notes (Signed)
Pain Management Virtual Encounter Note - Virtual Visit via Video Conference Telehealth (real-time audio visits between healthcare provider and patient).  Patient's Phone No. & Preferred Pharmacy:  [phone] (home); [phone] (mobile); (Preferred) [phone] [email or web address]  [name] [place], [place] - [address] AT [facility] [address] [place] [address] Phone: [phone] Fax: [phone]   Pre-screening note:  Our staff contacted Barbara Faulkner and offered her an "in person", "face-to-face" appointment versus a telephone encounter. She indicated preferring the telephone encounter, at this time.  Reason for Virtual Visit: COVID-19*  Social distancing based on CDC and AMA recommendations.   I contacted DERHONDA EASTLICK on 01/27/2019 at 2:11 PM via video conference.      I clearly identified myself as Edward Jolly, MD. I verified that I was speaking with the correct person using two identifiers (Name and date of birth: [birth year]).  Advanced Informed Consent I sought verbal advanced consent from [name] for virtual visit interactions. I informed Barbara Faulkner of possible security and privacy concerns, risks, and limitations associated with providing "not-in-person" medical evaluation and management services. I also informed Barbara Faulkner of the availability of "in-person" appointments. Finally, I informed her that there would be a charge for the virtual visit and that she could be  personally, fully or partially, financially responsible for it. Barbara Faulkner expressed understanding and agreed to proceed.   Historic Elements   Barbara Faulkner is a 53 y.o. year old, female patient evaluated today after her last encounter by our practice on 12/06/2018. [name]  has a past medical history of Allergy, Chronic pain, Chronic sinusitis, Depression, Heart murmur, Hiatal hernia, HPV (human papilloma virus) anogenital infection,  Migraine, and Psoriatic arthritis (HCC). She also  has a past surgical history that includes Cholecystectomy (2009); Tubal ligation (1997); Laparoscopic gastric banding (2008 ); Gastric bypass; Breast biopsy (Right, 01/28/2018); Colonoscopy with propofol (N/A, 07/19/2018); and Esophagogastroduodenoscopy (egd) with propofol (N/A, 07/19/2018). [name] has a current medication list which includes the following prescription(s): diclofenac sodium, fluticasone, iron dextran complex in sodium chloride 0.9 % 500 mL, multivitamin, paroxetine, tizanidine, tramadol, gabapentin, and omeprazole. She  reports that she quit smoking about 15 years ago. Her smoking use included cigarettes. She has a 40.00 pack-year smoking history. She has never used smokeless tobacco. She reports that she does not drink alcohol or use drugs. [name] is allergic to shellfish allergy and hydrocod polst-cpm polst er.   HPI  I last communicated with her on 12/06/2018. Today, she is being contacted for medication management.  No change in medical history, pain at baseline. Still has 1 Rx for Tramadol that can be filled (Rx written 09/08/2018 with 2 refills) Will refill Tizanidine as below. Continue Gabapentin as Rx, no refill needed  Pharmacotherapy Assessment   01/05/2019  1   09/08/2018  Tramadol Hcl 50 MG Tablet  90.00 30 Bi Lat   [id number]   Wal ([id number])   0  15.00 MME  Comm Ins   [place]     Monitoring: Pharmacotherapy: No side-effects or adverse reactions reported. [place] PMP: PDMP reviewed during this encounter.       Compliance: No problems identified. Effectiveness: Clinically acceptable. Plan: Refer to "POC".  Pertinent Labs  Renal Function Lab Results  Component Value Date   BUN 13 07/14/2018   CREATININE 0.86 07/14/2018   BCR NOT APPLICABLE 07/14/2018   GFRAA >60 02/15/2014   GFRNONAA >60 02/15/2014  Hepatic Function Lab Results  Component Value Date   AST 74 (H) 07/14/2018   ALT 64 (H) 07/14/2018   ALBUMIN 3.9  03/26/2016   UDS Summary  Date Value Ref Range Status  08/19/2018 FINAL  Final    Comment:    ==================================================================== TOXASSURE COMP DRUG ANALYSIS,UR ==================================================================== Test                             Result       Flag       Units Drug Present and Declared for Prescription Verification   Tramadol                       31           EXPECTED   ng/mg creat   N-Desmethyltramadol            66           EXPECTED   ng/mg creat    Source of tramadol is a prescription medication.    N-desmethyltramadol is an expected metabolite of tramadol.   Paroxetine                     PRESENT      EXPECTED Drug Absent but Declared for Prescription Verification   Tizanidine                     Not Detected UNEXPECTED    Tizanidine, as indicated in the declared medication list, is not    always detected even when used as directed.   Diclofenac                     Not Detected UNEXPECTED    Diclofenac, as indicated in the declared medication list, is not    always detected even when used as directed. ==================================================================== Test                      Result    Flag   Units      Ref Range   Creatinine              180              mg/dL      >=16>=20 ==================================================================== Declared Medications:  The flagging and interpretation on this report are based on the  following declared medications.  Unexpected results may arise from  inaccuracies in the declared medications.  **Note: The testing scope of this panel includes these medications:  Paroxetine  Tramadol  **Note: The testing scope of this panel does not include small to  moderate amounts of these reported medications:  Diclofenac  Tizanidine  **Note: The testing scope of this panel does not include following  reported medications:  Cholecalciferol  Cholestyramine   Multivitamin  Omeprazole ==================================================================== For clinical consultation, please call [phone]. ====================================================================    Note: Above Lab results reviewed.  Recent imaging  US Abdomen Complete CLINICAL DATA:  53 year old female with elevated LFTs.  EXAM: ABDOMEN ULTRASOUND COMPLETE  COMPARISON:  CT Abdomen and Pelvis 12/12/2011.  FINDINGS: Gallbladder: Surgically absent as in 2013.  Common bile duct: Diameter: 6-7 millimeters, within normal limits.  Liver: Liver echogenicity at the upper limits of normal. No discrete liver lesion or intrahepatic biliary ductal dilatation. Portal vein is patent on color Doppler imaging with normal direction of blood flow towards the liver.  IVC: No abnormality visualized.  Pancreas: Visualized portion unremarkable.  Spleen: Size and appearance within normal limits.  Right Kidney: Length: 11.1 centimeters. Chronic right renal midpole simple appearing cyst (image 62) measuring 22 millimeters is stable since 2013. Echogenicity within normal limits. No solid mass or hydronephrosis visualized.  Left Kidney: Length: 11.2 centimeters. Echogenicity within normal limits. No mass or hydronephrosis visualized.  Abdominal aorta: No aneurysm visualized.  Other findings: None.  IMPRESSION: 1. Negative ultrasound appearance of the liver status post cholecystectomy. 2. No acute findings in the abdomen.  Electronically Signed   By: Odessa Fleming M.D.   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 Diagnoses of Chronic arthralgias of knees and hips, Glenohumeral arthritis, right, Chronic right shoulder pain, Anxiety and depression, and Chronic pain syndrome were also pertinent to this visit.  Plan of Care  I have discontinued Hiyab E. Hunke's Cholecalciferol, azithromycin, and  amoxicillin-clavulanate. I am also having her maintain her multivitamin, omeprazole, diclofenac sodium, traMADol, gabapentin, fluticasone, PARoxetine, iron dextran complex in sodium chloride 0.9 % 500 mL, and tiZANidine.  Pharmacotherapy (Medications Ordered): Meds ordered this encounter  Medications  . tiZANidine (ZANAFLEX) 4 MG capsule    Sig: Take 1 capsule (4 mg total) by mouth at bedtime as needed for muscle spasms (leg cramps).    Dispense:  30 capsule    Refill:  2    Do not place this medication, or any other prescription from our practice, on "Automatic Refill". Patient may have prescription filled one day early if pharmacy is closed on scheduled refill date.   Orders:  No orders of the defined types were placed in this encounter.  Follow-up plan:   Return in about 10 weeks (around 04/07/2019) for Medication Management.    I discussed the assessment and treatment plan with the patient. The patient was provided an opportunity to ask questions and all were answered. The patient agreed with the plan and demonstrated an understanding of the instructions.  Patient advised to call back or seek an in-person evaluation if the symptoms or condition worsens.  Total duration of non-face-to-face encounter: .  Note by: Edward Jolly, MD Date: 01/27/2019; Time: 2:11 PM  Note: This dictation was prepared with Dragon dictation. Any transcriptional errors that may result from this process are unintentional.  Disclaimer:  * Given the special circumstances of the COVID-19 pandemic, the federal government has announced that the Office for Civil Rights (OCR) will exercise its enforcement discretion and will not impose penalties on physicians using telehealth in the event of noncompliance with regulatory requirements under the [employer] Portability and Accountability Act (HIPAA) in connection with the good faith provision of telehealth during the COVID-19 national public health emergency.  (AMA)

## 2019-02-24 ENCOUNTER — Other Ambulatory Visit: Payer: Self-pay

## 2019-02-24 ENCOUNTER — Ambulatory Visit (INDEPENDENT_AMBULATORY_CARE_PROVIDER_SITE_OTHER): Payer: Federal, State, Local not specified - PPO | Admitting: Internal Medicine

## 2019-02-24 DIAGNOSIS — E611 Iron deficiency: Secondary | ICD-10-CM

## 2019-02-24 DIAGNOSIS — R748 Abnormal levels of other serum enzymes: Secondary | ICD-10-CM | POA: Diagnosis not present

## 2019-02-24 DIAGNOSIS — E559 Vitamin D deficiency, unspecified: Secondary | ICD-10-CM | POA: Diagnosis not present

## 2019-02-24 DIAGNOSIS — D509 Iron deficiency anemia, unspecified: Secondary | ICD-10-CM

## 2019-02-24 DIAGNOSIS — R590 Localized enlarged lymph nodes: Secondary | ICD-10-CM

## 2019-02-24 NOTE — Progress Notes (Addendum)
Virtual Visit via Video Note  I connected with Barbara Faulkner  on 02/24/19 at  8:015 AM EDT by a video enabled telemedicine application and verified that I am speaking with the correct person using two identifiers.  Location patient: home Location provider:work  Persons participating in the virtual visit: patient, provider  I discussed the limitations of evaluation and management by telemedicine and the availability of in person appointments. The patient expressed understanding and agreed to proceed.   HPI: 1. Elevated liver enzymes 07/2018 will repeat labs US 08/2018 negative GI pathology will repeat labs 03/02/2019  -she does report taking sea moss  2. Abnormal mammogram 12/30/17 negative bx with fat necrosis and cystic change  3. Mood and pain doing well no issues  4. Iron def anemia will f/u H/o 04/2019 last iron infusion 09/2018 she not not take oral iron   ROS: See pertinent positives and negatives per HPI.  Past Medical History:  Diagnosis Date  . Allergy   . Chronic pain   . Chronic sinusitis   . Depression   . Heart murmur   . Hiatal hernia   . HPV (human papilloma virus) anogenital infection   . Migraine   . Psoriatic arthritis [facility](HCC)     Past Surgical History:  Procedure Laterality Date  . BREAST BIOPSY Right 01/28/2018   US guided biopsy - heart shaped  . CHOLECYSTECTOMY  2009  . COLONOSCOPY WITH PROPOFOL N/A 07/19/2018   Procedure: COLONOSCOPY WITH PROPOFOL;  Surgeon: Toney ReilVanga, Rohini Reddy, MD;  [facility];  Service: Gastroenterology;  Laterality: N/A;  . ESOPHAGOGASTRODUODENOSCOPY (EGD) WITH PROPOFOL N/A 07/19/2018   Procedure: ESOPHAGOGASTRODUODENOSCOPY (EGD) WITH PROPOFOL;  Surgeon: Toney ReilVanga, Rohini Reddy, MD;  [facility];  Service: Gastroenterology;  Laterality: N/A;  . GASTRIC BYPASS     2015; duodenal switch   . LAPAROSCOPIC GASTRIC BANDING  2008    removed 2009  . TUBAL LIGATION  1997    Family History  Problem Relation Age of  Onset  . HIV Father   . Hypertension Mother   . Hyperlipidemia Mother   . Lung cancer Maternal Grandfather        smoker  . Hepatitis Maternal Uncle        drug use  . Colon cancer Neg Hx   . Colon polyps Neg Hx   . Rectal cancer Neg Hx   . Stomach cancer Neg Hx   . Breast cancer Neg Hx     SOCIAL HX: works Electronics engineersafety supervisor at [employer]   Current Outpatient Medications:  .  diclofenac sodium (VOLTAREN) 1 % GEL, Apply topically 4 (four) times daily., Disp: , Rfl:  .  fluticasone (FLONASE) 50 MCG/ACT nasal spray, Place 2 sprays into both nostrils daily. Prn, Disp: 16 g, Rfl: 6 .  iron dextran complex in sodium chloride 0.9 % 500 mL, Inject 2,000 mg into the vein every 30 (thirty) days., Disp: , Rfl:  .  Multiple Vitamin (MULTIVITAMIN) tablet, Take 1 tablet by mouth daily., Disp: , Rfl:  .  PARoxetine (PAXIL) 40 MG tablet, TAKE 1 TABLET(40 MG) BY MOUTH DAILY, Disp: 90 tablet, Rfl: 3 .  tiZANidine (ZANAFLEX) 4 MG capsule, Take 1 capsule (4 mg total) by mouth at bedtime as needed for muscle spasms (leg cramps)., Disp: 30 capsule, Rfl: 2 .  traMADol (ULTRAM) 50 MG tablet, Take 1 tablet (50 mg total) by mouth 3 (three) times daily as needed. FOR SHOULDER OR ARTHRITIS PAIN, Disp: 90 tablet, Rfl: 2 .  gabapentin (  NEURONTIN) 300 MG capsule, Take 1 capsule (300 mg total) by mouth at bedtime., Disp: 30 capsule, Rfl: 2 .  omeprazole (PRILOSEC) 40 MG capsule, Take 1 capsule (40 mg total) by mouth 2 (two) times daily before a meal., Disp: 180 capsule, Rfl: 0  EXAM:  VITALS per patient if applicable:  GENERAL: alert, oriented, appears well and in no acute distress  HEENT: atraumatic, conjunttiva clear, no obvious abnormalities on inspection of external nose and ears  NECK: normal movements of the head and neck  LUNGS: on inspection no signs of respiratory distress, breathing rate appears normal, no obvious gross SOB, gasping or wheezing  CV: no obvious cyanosis  MS: moves all visible  extremities without noticeable abnormality  PSYCH/NEURO: pleasant and cooperative, no obvious depression or anxiety, speech and thought processing grossly intact  ASSESSMENT AND PLAN:  Discussed the following assessment and plan:  Elevated liver enzymes - Plan: Comprehensive metabolic panel, Gamma GT for elevated alkaline phos [place] negative 08/2018  Consider CT ab/pelvis if still elevated   Iron deficiency - Plan: CBC w/Diff, IBC + Ferritin, f/u H/o 04/2019   Iron deficiency anemia, unspecified iron deficiency anemia type - Plan: CBC w/Diff, IBC + Ferritin, last iron infusion 09/2018 f/u H/o 04/2019   Vitamin D deficiency - Plan: Vitamin D (25 hydroxy), check again   Lymphadenopathy, axillary - Plan: US BREAST LTD UNI RIGHT INC AXILLA,  Will see if this is due   HM Had flu shot per pt  Tdap utd  Consider shingrix in future   Pap 08/24/18 +HPV negative pap refer to ob/GYN in 08/2019  abnormal mammogram bx neg necrosis and cystic change due 01/19/19 nl breast exam right axillary lymph node  -ordered mammo and right axillary [place]   EGD/colonoscopy 07/19/18 ext hemorrhoids erosive gastritis bx mild gastritis   Former smoker quit 15 years ago smoked 10-13 years 1ppd rec healthy diet and exercise    04/27/19 saw Dr. Amil Amen initially tx'ed psoriatic arthritis 2014 Dr. Duard Brady took MTX x 2 months w/o help hand Xray no evidence of erosions of bone no tx requreed and f/u pain clinic  Consider paxil fo cymbalta or gabapentin to lyrica which could be more beneficial per note  F/u in 6 months   I discussed the assessment and treatment plan with the patient. The patient was provided an opportunity to ask questions and all were answered. The patient agreed with the plan and demonstrated an understanding of the instructions.   The patient was advised to call back or seek an in-person evaluation if the symptoms worsen or if the condition fails to improve as anticipated.  Time spent 15 minutes   Delorise Jackson, MD

## 2019-03-02 ENCOUNTER — Other Ambulatory Visit: Payer: Self-pay

## 2019-03-02 ENCOUNTER — Other Ambulatory Visit (INDEPENDENT_AMBULATORY_CARE_PROVIDER_SITE_OTHER): Payer: Federal, State, Local not specified - PPO

## 2019-03-02 ENCOUNTER — Other Ambulatory Visit: Payer: Self-pay | Admitting: Student in an Organized Health Care Education/Training Program

## 2019-03-02 DIAGNOSIS — E611 Iron deficiency: Secondary | ICD-10-CM

## 2019-03-02 DIAGNOSIS — D509 Iron deficiency anemia, unspecified: Secondary | ICD-10-CM

## 2019-03-02 DIAGNOSIS — R748 Abnormal levels of other serum enzymes: Secondary | ICD-10-CM | POA: Diagnosis not present

## 2019-03-02 DIAGNOSIS — E559 Vitamin D deficiency, unspecified: Secondary | ICD-10-CM

## 2019-03-02 LAB — CBC WITH DIFFERENTIAL/PLATELET
Basophils Absolute: 0 10*3/uL (ref 0.0–0.1)
Basophils Relative: 0.4 % (ref 0.0–3.0)
Eosinophils Absolute: 0.2 10*3/uL (ref 0.0–0.7)
Eosinophils Relative: 1.9 % (ref 0.0–5.0)
HCT: 38.1 % (ref 36.0–46.0)
Hemoglobin: 12 g/dL (ref 12.0–15.0)
Lymphocytes Relative: 24.8 % (ref 12.0–46.0)
Lymphs Abs: 2.2 10*3/uL (ref 0.7–4.0)
MCHC: 31.5 g/dL (ref 30.0–36.0)
MCV: 86.8 fl (ref 78.0–100.0)
Monocytes Absolute: 0.5 10*3/uL (ref 0.1–1.0)
Monocytes Relative: 5.7 % (ref 3.0–12.0)
Neutro Abs: 5.9 10*3/uL (ref 1.4–7.7)
Neutrophils Relative %: 67.2 % (ref 43.0–77.0)
Platelets: 138 10*3/uL — ABNORMAL LOW (ref 150.0–400.0)
RBC: 4.39 Mil/uL (ref 3.87–5.11)
RDW: 13.2 % (ref 11.5–15.5)
WBC: 8.7 10*3/uL (ref 4.0–10.5)

## 2019-03-02 LAB — IBC + FERRITIN
Ferritin: 87.6 ng/mL (ref 10.0–291.0)
Iron: 67 ug/dL (ref 42–145)
Saturation Ratios: 20.6 % (ref 20.0–50.0)
Transferrin: 232 mg/dL (ref 212.0–360.0)

## 2019-03-02 LAB — COMPREHENSIVE METABOLIC PANEL
ALT: 51 U/L — ABNORMAL HIGH (ref 0–35)
AST: 43 U/L — ABNORMAL HIGH (ref 0–37)
Albumin: 4.3 g/dL (ref 3.5–5.2)
Alkaline Phosphatase: 151 U/L — ABNORMAL HIGH (ref 39–117)
BUN: 16 mg/dL (ref 6–23)
CO2: 27 mEq/L (ref 19–32)
Calcium: 8.3 mg/dL — ABNORMAL LOW (ref 8.4–10.5)
Chloride: 112 mEq/L (ref 96–112)
Creatinine, Ser: 1.21 mg/dL — ABNORMAL HIGH (ref 0.40–1.20)
GFR: 56.82 mL/min — ABNORMAL LOW (ref >60.00)
Glucose, Bld: 83 mg/dL (ref 70–99)
Potassium: 3.6 mEq/L (ref 3.5–5.1)
Sodium: 146 mEq/L — ABNORMAL HIGH (ref 135–145)
Total Bilirubin: 0.6 mg/dL (ref 0.2–1.2)
Total Protein: 6.2 g/dL (ref 6.0–8.3)

## 2019-03-02 LAB — VITAMIN D 25 HYDROXY (VIT D DEFICIENCY, FRACTURES): VITD: 41.62 ng/mL (ref 30.00–100.00)

## 2019-03-02 LAB — GAMMA GT: GGT: 22 U/L (ref 7–51)

## 2019-03-03 ENCOUNTER — Other Ambulatory Visit: Payer: Self-pay | Admitting: Internal Medicine

## 2019-03-03 ENCOUNTER — Telehealth: Payer: Self-pay | Admitting: Internal Medicine

## 2019-03-03 DIAGNOSIS — E870 Hyperosmolality and hypernatremia: Secondary | ICD-10-CM

## 2019-03-03 DIAGNOSIS — M2550 Pain in unspecified joint: Secondary | ICD-10-CM

## 2019-03-03 DIAGNOSIS — N179 Acute kidney failure, unspecified: Secondary | ICD-10-CM

## 2019-03-03 DIAGNOSIS — R748 Abnormal levels of other serum enzymes: Secondary | ICD-10-CM

## 2019-03-03 NOTE — Telephone Encounter (Signed)
Did patient ever see rheumatology in [place]?   [place]

## 2019-03-07 NOTE — Telephone Encounter (Signed)
Patient said she has not seen rheumatology that she has not been called with an appointment   Please check on rheumatology appt with Dr. Tomasa Blase they needed prior records from prior rheumatologist if any  -inform pt  Thanks [place]

## 2019-03-07 NOTE — Telephone Encounter (Signed)
Patient said she has not seen rheumatology that she has not been called with an appointment.

## 2019-03-10 ENCOUNTER — Other Ambulatory Visit (INDEPENDENT_AMBULATORY_CARE_PROVIDER_SITE_OTHER): Payer: Federal, State, Local not specified - PPO

## 2019-03-10 ENCOUNTER — Other Ambulatory Visit: Payer: Self-pay | Admitting: Student in an Organized Health Care Education/Training Program

## 2019-03-10 ENCOUNTER — Other Ambulatory Visit: Payer: Self-pay

## 2019-03-10 DIAGNOSIS — N179 Acute kidney failure, unspecified: Secondary | ICD-10-CM | POA: Diagnosis not present

## 2019-03-10 DIAGNOSIS — M2550 Pain in unspecified joint: Secondary | ICD-10-CM

## 2019-03-10 DIAGNOSIS — E870 Hyperosmolality and hypernatremia: Secondary | ICD-10-CM

## 2019-03-10 DIAGNOSIS — R748 Abnormal levels of other serum enzymes: Secondary | ICD-10-CM

## 2019-03-10 LAB — BASIC METABOLIC PANEL
BUN: 17 mg/dL (ref 6–23)
CO2: 23 mEq/L (ref 19–32)
Calcium: 8.3 mg/dL — ABNORMAL LOW (ref 8.4–10.5)
Chloride: 113 mEq/L — ABNORMAL HIGH (ref 96–112)
Creatinine, Ser: 0.95 mg/dL (ref 0.40–1.20)
GFR: 75.12 mL/min (ref >60.00)
Glucose, Bld: 96 mg/dL (ref 70–99)
Potassium: 3.5 mEq/L (ref 3.5–5.1)
Sodium: 144 mEq/L (ref 135–145)

## 2019-03-10 LAB — C-REACTIVE PROTEIN: CRP: <1 mg/dL (ref 0.5–20.0)

## 2019-03-10 LAB — CK: Total CK: 183 U/L — ABNORMAL HIGH (ref 7–177)

## 2019-03-10 LAB — SEDIMENTATION RATE: Sed Rate: 3 mm/hr (ref 0–30)

## 2019-03-11 ENCOUNTER — Ambulatory Visit
Admission: RE | Admit: 2019-03-11 | Discharge: 2019-03-11 | Disposition: A | Discharge status: Home / Self Care | Payer: Federal, State, Local not specified - PPO | Source: Ambulatory Visit | Attending: Internal Medicine | Admitting: Internal Medicine

## 2019-03-11 DIAGNOSIS — Z1231 Encounter for screening mammogram for malignant neoplasm of breast: Secondary | ICD-10-CM | POA: Diagnosis not present

## 2019-03-11 NOTE — Telephone Encounter (Signed)
Per Dr. Sabino Donovan office: We need patients previous rheumatology records for review. Patient is aware. " [facility] said 8 months ago  "Rejection Reason - Other - We will hold referral however we need patients previous rheumatology records for review. Patient is working on getting those. Thank you. " [facility] said 8 months ago   I will reach out to her to make sure that she has not forgotten about getting these records for them. [place]

## 2019-03-14 MED ORDER — GABAPENTIN 300 MG PO CAPS: 300.0000 mg | ORAL_CAPSULE | Freq: Every day | ORAL | 2 refills | Status: DC

## 2019-03-14 NOTE — Telephone Encounter (Signed)
Will you send this in?

## 2019-03-15 ENCOUNTER — Other Ambulatory Visit: Payer: Self-pay | Admitting: Internal Medicine

## 2019-03-15 DIAGNOSIS — R748 Abnormal levels of other serum enzymes: Secondary | ICD-10-CM

## 2019-03-15 DIAGNOSIS — D696 Thrombocytopenia, unspecified: Secondary | ICD-10-CM

## 2019-03-15 LAB — CYCLIC CITRUL PEPTIDE ANTIBODY, IGG: Cyclic Citrullin Peptide Ab: <16 UNITS

## 2019-03-15 LAB — HEPATITIS A ANTIBODY, TOTAL: Hepatitis A AB,Total: NONREACTIVE

## 2019-03-15 LAB — TISSUE TRANSGLUTAMINASE, IGA: (tTG) Ab, IgA: 1 U/mL

## 2019-03-15 LAB — ANA: Anti Nuclear Antibody (ANA): NEGATIVE

## 2019-03-15 LAB — HEPATITIS B SURFACE ANTIBODY, QUANTITATIVE: Hep B S AB Quant (Post): <5 m[IU]/mL — ABNORMAL LOW (ref >=10)

## 2019-03-15 LAB — MITOCHONDRIAL ANTIBODIES: Mitochondrial M2 Ab, IgG: <=20 U

## 2019-03-15 LAB — GLIADIN ANTIBODIES, SERUM
Gliadin IgA: 3 Units
Gliadin IgG: 3 Units

## 2019-03-15 LAB — HEPATITIS B CORE ANTIBODY, TOTAL: Hep B Core Total Ab: NONREACTIVE

## 2019-03-15 LAB — RETICULIN ANTIBODIES, IGA W TITER: Reticulin IGA Screen: NEGATIVE

## 2019-03-15 LAB — RHEUMATOID FACTOR: Rheumatoid fact SerPl-aCnc: <14 IU/mL (ref <14)

## 2019-03-15 LAB — ANTI-SMOOTH MUSCLE ANTIBODY, IGG: Actin (Smooth Muscle) Antibody (IGG): <20 U (ref <20)

## 2019-03-15 LAB — ANTI-MICROSOMAL ANTIBODY LIVER / KIDNEY: LKM1 Ab: <=20 U (ref <=20.0)

## 2019-03-15 LAB — HEPATITIS B SURFACE ANTIGEN: Hepatitis B Surface Ag: NONREACTIVE

## 2019-03-15 LAB — HEPATITIS C ANTIBODY
Hepatitis C Ab: NONREACTIVE
SIGNAL TO CUT-OFF: 0.01 (ref <1.00)

## 2019-03-16 ENCOUNTER — Telehealth: Payer: Self-pay | Admitting: Internal Medicine

## 2019-03-16 ENCOUNTER — Other Ambulatory Visit: Payer: Self-pay | Admitting: Internal Medicine

## 2019-03-16 DIAGNOSIS — R109 Unspecified abdominal pain: Secondary | ICD-10-CM

## 2019-03-16 DIAGNOSIS — Z20822 Contact with and (suspected) exposure to covid-19: Secondary | ICD-10-CM

## 2019-03-16 DIAGNOSIS — Z20828 Contact with and (suspected) exposure to other viral communicable diseases: Secondary | ICD-10-CM

## 2019-03-16 DIAGNOSIS — R748 Abnormal levels of other serum enzymes: Secondary | ICD-10-CM

## 2019-03-16 NOTE — Telephone Encounter (Signed)
Stop sea moss  Schedule lab visit in 1 month to check CMET (liver enzymes), platelets and COVID 19 antibodies per pt request   I dont not any hepatitis based on her labs   [place]

## 2019-03-17 NOTE — Telephone Encounter (Signed)
Patient has been informed and appointment made.

## 2019-03-30 ENCOUNTER — Encounter
Admission: RE | Admit: 2019-03-30 | Discharge: 2019-03-30 | Disposition: A | Discharge status: Home / Self Care | Payer: Federal, State, Local not specified - PPO | Source: Ambulatory Visit | Attending: Internal Medicine | Admitting: Internal Medicine

## 2019-03-30 ENCOUNTER — Ambulatory Visit
Admission: RE | Admit: 2019-03-30 | Discharge: 2019-03-30 | Disposition: A | Discharge status: Home / Self Care | Payer: Federal, State, Local not specified - PPO | Source: Ambulatory Visit | Attending: Internal Medicine | Admitting: Internal Medicine

## 2019-03-30 ENCOUNTER — Other Ambulatory Visit: Payer: Self-pay

## 2019-03-30 DIAGNOSIS — R748 Abnormal levels of other serum enzymes: Secondary | ICD-10-CM | POA: Diagnosis not present

## 2019-03-30 DIAGNOSIS — R109 Unspecified abdominal pain: Secondary | ICD-10-CM | POA: Diagnosis not present

## 2019-03-30 DIAGNOSIS — N281 Cyst of kidney, acquired: Secondary | ICD-10-CM | POA: Diagnosis not present

## 2019-03-30 DIAGNOSIS — C801 Malignant (primary) neoplasm, unspecified: Secondary | ICD-10-CM | POA: Diagnosis not present

## 2019-03-30 DIAGNOSIS — C799 Secondary malignant neoplasm of unspecified site: Secondary | ICD-10-CM | POA: Diagnosis not present

## 2019-03-30 DIAGNOSIS — N200 Calculus of kidney: Secondary | ICD-10-CM | POA: Diagnosis not present

## 2019-03-30 MED ORDER — TECHNETIUM TC 99M MEDRONATE IV KIT
20.0000 | PACK | Freq: Once | INTRAVENOUS | Status: AC | PRN
  Administered 2019-03-30: 22.76 via INTRAVENOUS

## 2019-04-01 ENCOUNTER — Other Ambulatory Visit: Payer: Self-pay | Admitting: Internal Medicine

## 2019-04-01 DIAGNOSIS — Q766 Other congenital malformations of ribs: Secondary | ICD-10-CM

## 2019-04-03 NOTE — Progress Notes (Signed)
[facility]  Telephone:(336) [phone] Fax:(336) [phone]  ID: Barbara Faulkner OB: 07/13/1966  MR#: [id number]  GEX#:[id number]#:[id number]  Patient Care Team: McLean-Scocuzza, Pasty Spillersracy N, MD as PCP - General (Internal Medicine)  CHIEF COMPLAINT:  Iron deficiency anemia.  INTERVAL HISTORY: Patient returns to clinic today for repeat laboratory work and further evaluation.  She continues to feel well and remains asymptomatic.  She does not complain of weakness or fatigue.  She has no neurologic complaints.  She denies any recent fevers or illnesses.  She has a good appetite and denies weight loss.  She denies any chest pain, shortness of breath, cough, or hemoptysis.  She denies any nausea, vomiting, constipation, or diarrhea.  She denies any melena or hematochezia.  She has no urinary complaints.  Patient offers no specific complaints today.  REVIEW OF SYSTEMS:   Review of Systems  Constitutional: Negative.  Negative for fever, malaise/fatigue and weight loss.  Respiratory: Negative.  Negative for cough, hemoptysis and shortness of breath.   Cardiovascular: Negative.  Negative for chest pain and leg swelling.  Gastrointestinal: Negative.  Negative for abdominal pain, blood in stool and melena.  Genitourinary: Negative.  Negative for dysuria and hematuria.  Musculoskeletal: Negative.  Negative for back pain.  Skin: Negative.  Negative for rash.  Neurological: Negative.  Negative for focal weakness, weakness and headaches.  Psychiatric/Behavioral: Negative.  The patient is not nervous/anxious.     As per HPI. Otherwise, a complete review of systems is negative.  PAST MEDICAL HISTORY: Past Medical History:  Diagnosis Date   Allergy    Chronic pain    Chronic sinusitis    Depression    Heart murmur    Hiatal hernia    HPV (human papilloma virus) anogenital infection    Migraine    Psoriatic arthritis (HCC)     PAST SURGICAL HISTORY: Past Surgical History:  Procedure  Laterality Date   BREAST BIOPSY Right 01/28/2018   US guided biopsy - heart shaped   CHOLECYSTECTOMY  2009   COLONOSCOPY WITH PROPOFOL N/A 07/19/2018   Procedure: COLONOSCOPY WITH PROPOFOL;  Surgeon: Toney ReilVanga, Rohini Reddy, MD;  Location: [facility] ENDOSCOPY;  Service: Gastroenterology;  Laterality: N/A;   ESOPHAGOGASTRODUODENOSCOPY (EGD) WITH PROPOFOL N/A 07/19/2018   Procedure: ESOPHAGOGASTRODUODENOSCOPY (EGD) WITH PROPOFOL;  Surgeon: Toney ReilVanga, Rohini Reddy, MD;  [facility];  Service: Gastroenterology;  Laterality: N/A;   GASTRIC BYPASS     2015; duodenal switch    LAPAROSCOPIC GASTRIC BANDING  2008    removed 2009   TUBAL LIGATION  1997    FAMILY HISTORY: Family History  Problem Relation Age of Onset   HIV Father    Hypertension Mother    Hyperlipidemia Mother    Lung cancer Maternal Grandfather        smoker   Hepatitis Maternal Uncle        drug use   Colon cancer Neg Hx    Colon polyps Neg Hx    Rectal cancer Neg Hx    Stomach cancer Neg Hx    Breast cancer Neg Hx     ADVANCED DIRECTIVES (Y/N):  N  HEALTH MAINTENANCE: Social History   Tobacco Use   Smoking status: Former Smoker    Packs/day: 2.00    Years: 20.00    Pack years: 40.00    Types: Cigarettes    Quit date: 09/02/2003    Years since quitting: 15.6   Smokeless tobacco: Never Used  Substance Use Topics   Alcohol use: No  Alcohol/week: 0.0 standard drinks   Drug use: No     Colonoscopy:  PAP:  Bone density:  Lipid panel:  Allergies  Allergen Reactions   Shellfish Allergy Swelling    REACTION: throat closing   Hydrocod Polst-Cpm Polst Er Other (See Comments)    Bad dreams    Current Outpatient Medications  Medication Sig Dispense Refill   diclofenac sodium (VOLTAREN) 1 % GEL Apply topically 4 (four) times daily.     fluticasone (FLONASE) 50 MCG/ACT nasal spray Place 2 sprays into both nostrils daily. Prn 16 g 6   gabapentin (NEURONTIN) 300 MG capsule Take 1  capsule (300 mg total) by mouth at bedtime. 30 capsule 2   iron dextran complex in sodium chloride 0.9 % 500 mL Inject 2,000 mg into the vein every 30 (thirty) days.     Multiple Vitamin (MULTIVITAMIN) tablet Take 1 tablet by mouth daily.     omeprazole (PRILOSEC) 40 MG capsule Take 1 capsule (40 mg total) by mouth 2 (two) times daily before a meal. 180 capsule 0   PARoxetine (PAXIL) 40 MG tablet TAKE 1 TABLET(40 MG) BY MOUTH DAILY 90 tablet 3   tiZANidine (ZANAFLEX) 4 MG capsule Take 1 capsule (4 mg total) by mouth at bedtime as needed for muscle spasms (leg cramps). 30 capsule 2   traMADol (ULTRAM) 50 MG tablet Take 1 tablet (50 mg total) by mouth 3 (three) times daily as needed. FOR SHOULDER OR ARTHRITIS PAIN 90 tablet 2   No current facility-administered medications for this visit.     OBJECTIVE: Vitals:   04/05/19 1339  BP: 123/84  Pulse: 70  Temp: 97.9 F (36.6 C)     Body mass index is 29.05 kg/m.    ECOG FS:0 - Asymptomatic  General: Well-developed, well-nourished, no acute distress. Eyes: Pink conjunctiva, anicteric sclera. HEENT: Normocephalic, moist mucous membranes. Lungs: Clear to auscultation bilaterally. Heart: Regular rate and rhythm. No rubs, murmurs, or gallops. Abdomen: Soft, nontender, nondistended. No organomegaly noted, normoactive bowel sounds. Musculoskeletal: No edema, cyanosis, or clubbing. Neuro: Alert, answering all questions appropriately. Cranial nerves grossly intact. Skin: No rashes or petechiae noted. Psych: Normal affect.   LAB RESULTS:  Lab Results  Component Value Date   NA 144 03/10/2019   K 3.5 03/10/2019   CL 113 (H) 03/10/2019   CO2 23 03/10/2019   GLUCOSE 96 03/10/2019   BUN 17 03/10/2019   CREATININE 0.95 03/10/2019   CALCIUM 8.3 (L) 03/10/2019   PROT 6.2 03/02/2019   ALBUMIN 4.3 03/02/2019   AST 43 (H) 03/02/2019   ALT 51 (H) 03/02/2019   ALKPHOS 151 (H) 03/02/2019   BILITOT 0.6 03/02/2019   GFRNONAA >60 02/15/2014    GFRAA >60 02/15/2014    Lab Results  Component Value Date   WBC 7.5 04/05/2019   NEUTROABS 5.9 03/02/2019   HGB 11.2 (L) 04/05/2019   HCT 35.5 (L) 04/05/2019   MCV 87.2 04/05/2019   PLT 144 (L) 04/05/2019   Lab Results  Component Value Date   IRON 63 04/05/2019   TIBC 294 04/05/2019   IRONPCTSAT 21 04/05/2019   Lab Results  Component Value Date   FERRITIN 83 04/05/2019     STUDIES: Ct Abdomen Pelvis Wo Contrast  Result Date: 03/30/2019 CLINICAL DATA:  Abdominal pain, elevated LFTs, negative autoimmune workup, former smoker, history psoriatic arthritis EXAM: CT ABDOMEN AND PELVIS WITHOUT CONTRAST TECHNIQUE: Multidetector CT imaging of the abdomen and pelvis was performed following the standard protocol without IV contrast. Sagittal  and coronal MPR images reconstructed from axial data set. No oral contrast. COMPARISON:  12/12/2011 FINDINGS: Lower chest: Lung bases clear Hepatobiliary: Gallbladder surgically absent.  Liver unremarkable. Pancreas: Normal appearance Spleen: Normal appearance Adrenals/Urinary Tract: Adrenal glands unremarkable. 3 mm nonobstructing RIGHT renal calculus. 2.8 x 2.3 cm diameter cyst at posterior RIGHT kidney, slightly increased from prior exam. No additional renal mass or hydronephrosis. Ureters poorly visualized. Bladder contains minimal urine. Stomach/Bowel: Scattered stool throughout colon. Normal appendix. Prior gastric bypass surgery with small reservoir. No focal bowel abnormalities. Vascular/Lymphatic: Atherosclerotic calcifications aorta. Aorta normal caliber. No definite adenopathy visualized though retroperitoneal back and pelvic tissue planes are poorly defined limiting assessment. Reproductive: Unremarkable uterus. Other: No free air or free fluid.  No hernia. Musculoskeletal: Cystic degenerative changes at the femoral heads. No focal osseous lesions otherwise seen. IMPRESSION: 3 mm nonobstructing RIGHT renal calculus. 2.8 cm RIGHT renal cyst, slightly  increased. Prior gastric bypass surgery. No definite acute intra-abdominal or intrapelvic process. Electronically Signed   By: Lavonia Dana M.D.   On: 03/30/2019 16:43   Dg Chest 2 View  Result Date: 04/04/2019 CLINICAL DATA:  Questionable focus of abnormal osseous tracer accumulation of the posterior left fourth rib on bone scan. EXAM: CHEST - 2 VIEW COMPARISON:  Chest x-ray November 26, 2015, bone scan March 30, 2019 FINDINGS: The mediastinal contour and cardiac silhouette are normal. The lungs are clear. The bony structures are normal. No focal abnormalities identified in the left fourth rib. IMPRESSION: No acute cardiopulmonary disease identified. No focal abnormalities identified in the left fourth rib. Electronically Signed   By: Abelardo Diesel M.D.   On: 04/04/2019 20:26   Dg Ribs Unilateral Left  Result Date: 04/04/2019 CLINICAL DATA:  Abnormal 4th rib on prior bone scan. EXAM: LEFT RIBS - 2 VIEW COMPARISON:  Bone scan 03/30/2019.  Chest x-ray today. FINDINGS: No fracture or other bone lesions are seen involving the ribs. In particular, no abnormality visualized in the region of the 4th rib. IMPRESSION: Negative. Electronically Signed   By: Rolm Baptise M.D.   On: 04/04/2019 20:34   Nm Bone Scan Whole Body  Result Date: 03/30/2019 CLINICAL DATA:  Back pain, elevated LFTs, elevated alkaline phosphatase, suspect a spondyloarthropathy EXAM: NUCLEAR MEDICINE WHOLE BODY BONE SCAN TECHNIQUE: Whole body anterior and posterior images were obtained approximately 3 hours after intravenous injection of radiopharmaceutical. RADIOPHARMACEUTICALS:  22.76 mCi Technetium-24m MDP IV COMPARISON:  None Radiographic correlation: CT abdomen pelvis 03/30/2019, chest radiograph 11/26/2015 FINDINGS: Mild diffusely increased tracer accumulation throughout osseous structures of the axial and appendicular skeleton. No focal sites of abnormal osseous tracer accumulation are definitely visualized. Questionable abnormal tracer  accumulation at the posterior LEFT fourth rib. No abnormal findings at the posterior LEFT fourth rib on remote chest radiograph. Pain faint visualization of the kidneys. Small amount of excreted tracer within urinary bladder and within scattered soft tissues. IMPRESSION: Single questionable focus of abnormal osseous tracer accumulation at the posterior LEFT fourth rib, recommend correlation with chest radiograph. Increased uptake of tracer diffusely throughout the skeleton with relatively decreased soft tissue and renal localization of tracer, raising question of a diffuse metabolic bone disease; causes would include hyperparathyroidism, hypervitaminosis D, renal osteodystrophy, osteomalacia, and multiple other causes. Diffuse metastatic disease can cause this appearance though this is considered unlikely in a patient without a history of malignancy and without focal osseous abnormalities by CT. Electronically Signed   By: Lavonia Dana M.D.   On: 03/30/2019 17:22   Mm 3d Screen Breast  Bilateral  Result Date: 03/11/2019 CLINICAL DATA:  Screening. EXAM: DIGITAL SCREENING BILATERAL MAMMOGRAM WITH TOMO AND CAD COMPARISON:  Previous exam(s). ACR Breast Density Category b: There are scattered areas of fibroglandular density. FINDINGS: There are no findings suspicious for malignancy. Images were processed with CAD. IMPRESSION: No mammographic evidence of malignancy. A result letter of this screening mammogram will be mailed directly to the patient. RECOMMENDATION: Screening mammogram in one year. (Code:SM-B-01Y) BI-RADS CATEGORY  1: Negative. Electronically Signed   By: Britta MccreedySusan  Turner M.D.   On: 03/11/2019 14:17    ASSESSMENT: Iron deficiency anemia  PLAN:    1.  Iron deficiency anemia: Likely secondary to poor absorption from history of gastric bypass.  Patient had colonoscopy and endoscopy on July 19, 2018 that did not reveal definitive source of bleeding.  Patient also reports she cannot tolerate oral iron  supplementation.  Patient's hemoglobin has improved and is now 11.2.  Her iron stores are now within normal limits.  She does not require additional IV Feraheme today.  Return to clinic in 4 months with repeat laboratory work, further evaluation, and continuation of treatment if necessary.   I spent a total of 20 minutes face-to-face with the patient of which greater than 50% of the visit was spent in counseling and coordination of care as detailed above.   Patient expressed understanding and was in agreement with this plan. She also understands that She can call clinic at any time with any questions, concerns, or complaints.     Jeralyn Ruthsimothy J Tyjai Matuszak, MD   04/06/2019 4:11 PM

## 2019-04-04 ENCOUNTER — Other Ambulatory Visit: Payer: Self-pay

## 2019-04-04 ENCOUNTER — Ambulatory Visit (INDEPENDENT_AMBULATORY_CARE_PROVIDER_SITE_OTHER): Payer: Federal, State, Local not specified - PPO

## 2019-04-04 DIAGNOSIS — M898X8 Other specified disorders of bone, other site: Secondary | ICD-10-CM | POA: Diagnosis not present

## 2019-04-04 DIAGNOSIS — Q766 Other congenital malformations of ribs: Secondary | ICD-10-CM

## 2019-04-04 DIAGNOSIS — D509 Iron deficiency anemia, unspecified: Secondary | ICD-10-CM

## 2019-04-05 ENCOUNTER — Other Ambulatory Visit: Payer: Self-pay

## 2019-04-05 ENCOUNTER — Inpatient Hospital Stay: Payer: Federal, State, Local not specified - PPO

## 2019-04-05 ENCOUNTER — Encounter: Payer: Self-pay | Admitting: Oncology

## 2019-04-05 ENCOUNTER — Other Ambulatory Visit: Payer: Self-pay | Admitting: Student in an Organized Health Care Education/Training Program

## 2019-04-05 ENCOUNTER — Inpatient Hospital Stay (HOSPITAL_BASED_OUTPATIENT_CLINIC_OR_DEPARTMENT_OTHER): Payer: Federal, State, Local not specified - PPO | Admitting: Oncology

## 2019-04-05 ENCOUNTER — Inpatient Hospital Stay: Payer: Federal, State, Local not specified - PPO | Attending: Oncology

## 2019-04-05 VITALS — BP 123/84 | HR 70 | Temp 97.9°F | Wt 164.0 lb

## 2019-04-05 DIAGNOSIS — F329 Major depressive disorder, single episode, unspecified: Secondary | ICD-10-CM | POA: Diagnosis not present

## 2019-04-05 DIAGNOSIS — Z9884 Bariatric surgery status: Secondary | ICD-10-CM | POA: Insufficient documentation

## 2019-04-05 DIAGNOSIS — G8929 Other chronic pain: Secondary | ICD-10-CM | POA: Insufficient documentation

## 2019-04-05 DIAGNOSIS — L4050 Arthropathic psoriasis, unspecified: Secondary | ICD-10-CM | POA: Insufficient documentation

## 2019-04-05 DIAGNOSIS — M25511 Pain in right shoulder: Secondary | ICD-10-CM

## 2019-04-05 DIAGNOSIS — N200 Calculus of kidney: Secondary | ICD-10-CM | POA: Diagnosis not present

## 2019-04-05 DIAGNOSIS — Z800 Family history of malignant neoplasm of digestive organs: Secondary | ICD-10-CM | POA: Diagnosis not present

## 2019-04-05 DIAGNOSIS — G894 Chronic pain syndrome: Secondary | ICD-10-CM

## 2019-04-05 DIAGNOSIS — K449 Diaphragmatic hernia without obstruction or gangrene: Secondary | ICD-10-CM | POA: Diagnosis not present

## 2019-04-05 DIAGNOSIS — Z803 Family history of malignant neoplasm of breast: Secondary | ICD-10-CM | POA: Diagnosis not present

## 2019-04-05 DIAGNOSIS — D509 Iron deficiency anemia, unspecified: Secondary | ICD-10-CM | POA: Diagnosis not present

## 2019-04-05 DIAGNOSIS — R011 Cardiac murmur, unspecified: Secondary | ICD-10-CM | POA: Insufficient documentation

## 2019-04-05 DIAGNOSIS — Z87891 Personal history of nicotine dependence: Secondary | ICD-10-CM | POA: Diagnosis not present

## 2019-04-05 DIAGNOSIS — Z79899 Other long term (current) drug therapy: Secondary | ICD-10-CM | POA: Diagnosis not present

## 2019-04-05 LAB — CBC
HCT: 35.5 % — ABNORMAL LOW (ref 36.0–46.0)
Hemoglobin: 11.2 g/dL — ABNORMAL LOW (ref 12.0–15.0)
MCH: 27.5 pg (ref 26.0–34.0)
MCHC: 31.5 g/dL (ref 30.0–36.0)
MCV: 87.2 fL (ref 80.0–100.0)
Platelets: 144 10*3/uL — ABNORMAL LOW (ref 150–400)
RBC: 4.07 MIL/uL (ref 3.87–5.11)
RDW: 12.7 % (ref 11.5–15.5)
WBC: 7.5 10*3/uL (ref 4.0–10.5)
nRBC: 0 % (ref 0.0–0.2)

## 2019-04-05 LAB — FERRITIN: Ferritin: 83 ng/mL (ref 11–307)

## 2019-04-05 LAB — IRON AND TIBC
Iron: 63 ug/dL (ref 28–170)
Saturation Ratios: 21 % (ref 10.4–31.8)
TIBC: 294 ug/dL (ref 250–450)
UIBC: 231 ug/dL

## 2019-04-05 NOTE — Progress Notes (Signed)
Patient stated that she had been doing well. 

## 2019-04-06 ENCOUNTER — Encounter: Payer: Self-pay | Admitting: Student in an Organized Health Care Education/Training Program

## 2019-04-07 ENCOUNTER — Other Ambulatory Visit: Payer: Self-pay

## 2019-04-07 ENCOUNTER — Encounter: Payer: Self-pay | Admitting: Student in an Organized Health Care Education/Training Program

## 2019-04-07 ENCOUNTER — Ambulatory Visit
Payer: Federal, State, Local not specified - PPO | Attending: Student in an Organized Health Care Education/Training Program | Admitting: Student in an Organized Health Care Education/Training Program

## 2019-04-07 DIAGNOSIS — M25511 Pain in right shoulder: Secondary | ICD-10-CM | POA: Diagnosis not present

## 2019-04-07 DIAGNOSIS — M25512 Pain in left shoulder: Secondary | ICD-10-CM

## 2019-04-07 DIAGNOSIS — G8929 Other chronic pain: Secondary | ICD-10-CM | POA: Diagnosis not present

## 2019-04-07 DIAGNOSIS — G894 Chronic pain syndrome: Secondary | ICD-10-CM | POA: Diagnosis not present

## 2019-04-07 DIAGNOSIS — L4050 Arthropathic psoriasis, unspecified: Secondary | ICD-10-CM | POA: Diagnosis not present

## 2019-04-07 MED ORDER — TRAMADOL HCL 50 MG PO TABS: 50.0000 mg | ORAL_TABLET | Freq: Three times a day (TID) | ORAL | 2 refills | Status: DC | PRN

## 2019-04-07 NOTE — Progress Notes (Signed)
Pain Management Virtual Encounter Note - Virtual Visit via Telephone Telehealth (real-time audio visits between healthcare provider and patient).   Patient's Phone No. & Preferred Pharmacy:  [phone] (home); [phone] (mobile); (Preferred) [phone] [email or web address]  [id number] Nicholes Rough- [place], [place] - [address] [facility] [address] [place] [id number] Phone: [phone] Fax: [phone]    Pre-screening note:  Our staff contacted [name] and offered her an "in person", "face-to-face" appointment versus a telephone encounter. She indicated preferring the telephone encounter, at this time.   Reason for Virtual Visit: COVID-19*  Social distancing based on CDC and AMA recommendations.   I contacted Barbara ChannelLisa E Beattie on 04/07/2019 via telephone.      I clearly identified myself as Edward JollyBilal Beckett Hickmon, MD. I verified that I was speaking with the correct person using two identifiers (Name: Barbara ChannelLisa E Rathert, and date of birth: 11/30/1965).  Advanced Informed Consent I sought verbal advanced consent from Barbara ChannelLisa E Pieratt for virtual visit interactions. I informed Barbara Faulkner of possible security and privacy concerns, risks, and limitations associated with providing "not-in-person" medical evaluation and management services. I also informed Barbara Faulkner of the availability of "in-person" appointments. Finally, I informed her that there would be a charge for the virtual visit and that she could be  personally, fully or partially, financially responsible for it. Barbara Faulkner expressed understanding and agreed to proceed.   Historic Elements   [name] is a 53 y.o. year old, female patient evaluated today after her last encounter by our practice on 04/05/2019. Barbara Faulkner  has a past medical history of Allergy, Chronic pain, Chronic sinusitis, Depression, Heart murmur, Hiatal hernia, HPV (human papilloma virus) anogenital infection,  Migraine, and Psoriatic arthritis (HCC). She also  has a past surgical history that includes Cholecystectomy (2009); Tubal ligation (1997); Laparoscopic gastric banding (2008 ); Gastric bypass; Breast biopsy (Right, 01/28/2018); Colonoscopy with propofol (N/A, 07/19/2018); and Esophagogastroduodenoscopy (egd) with propofol (N/A, 07/19/2018). Barbara Faulkner has a current medication list which includes the following prescription(s): diclofenac sodium, fluticasone, gabapentin, iron dextran complex in sodium chloride 0.9 % 500 mL, multivitamin, paroxetine, tizanidine, tramadol, and omeprazole. She  reports that she quit smoking about 15 years ago. Her smoking use included cigarettes. She has a 40.00 pack-year smoking history. She has never used smokeless tobacco. She reports that she does not drink alcohol or use drugs. Barbara Faulkner is allergic to shellfish allergy and hydrocod polst-cpm polst er.   HPI  Today, she is being contacted for medication management.   No change in medical history since last visit.  Patient's pain is at baseline.  Patient continues multimodal pain regimen as prescribed.  States that it provides pain relief and improvement in functional status.   Pharmacotherapy Assessment  Analgesic: Tramadol 50 mg TID prn (2 fills since 09/08/2018)   Monitoring: Pharmacotherapy: No side-effects or adverse reactions reported. [place] PMP: PDMP reviewed during this encounter.       Compliance: No problems identified. Effectiveness: Clinically acceptable. Plan: Refer to "POC".  UDS:  Summary  Date Value Ref Range Status  08/19/2018 FINAL  Final    Comment:    ==================================================================== TOXASSURE COMP DRUG ANALYSIS,UR ==================================================================== Test                             Result       Flag       Units Drug Present  and Declared for Prescription Verification   Tramadol                       31            EXPECTED   ng/mg creat   N-Desmethyltramadol            66           EXPECTED   ng/mg creat    Source of tramadol is a prescription medication.    N-desmethyltramadol is an expected metabolite of tramadol.   Paroxetine                     PRESENT      EXPECTED Drug Absent but Declared for Prescription Verification   Tizanidine                     Not Detected UNEXPECTED    Tizanidine, as indicated in the declared medication list, is not    always detected even when used as directed.   Diclofenac                     Not Detected UNEXPECTED    Diclofenac, as indicated in the declared medication list, is not    always detected even when used as directed. ==================================================================== Test                      Result    Flag   Units      Ref Range   Creatinine              180              mg/dL      >=16>=20 ==================================================================== Declared Medications:  The flagging and interpretation on this report are based on the  following declared medications.  Unexpected results may arise from  inaccuracies in the declared medications.  **Note: The testing scope of this panel includes these medications:  Paroxetine  Tramadol  **Note: The testing scope of this panel does not include small to  moderate amounts of these reported medications:  Diclofenac  Tizanidine  **Note: The testing scope of this panel does not include following  reported medications:  Cholecalciferol  Cholestyramine  Multivitamin  Omeprazole ==================================================================== For clinical consultation, please call [phone]. ====================================================================    Laboratory Chemistry Profile (12 mo)  Renal: 07/14/2018: BUN/Creatinine Ratio NOT APPLICABLE 03/10/2019: BUN 17; Creatinine, Ser 0.95  Lab Results  Component Value Date   GFRAA >60 02/15/2014   GFRNONAA >60  02/15/2014   Hepatic: 03/02/2019: Albumin 4.3 Lab Results  Component Value Date   AST 43 (H) 03/02/2019   ALT 51 (H) 03/02/2019   Other: 07/14/2018: Vitamin B-12 612 03/02/2019: VITD 41.62 03/10/2019: CRP <1.0; Sed Rate 3 Note: Above Lab results reviewed.  Imaging  Last 90 days:  Ct Abdomen Pelvis Wo Contrast  Result Date: 03/30/2019 CLINICAL DATA:  Abdominal pain, elevated LFTs, negative autoimmune workup, former smoker, history psoriatic arthritis EXAM: CT ABDOMEN AND PELVIS WITHOUT CONTRAST TECHNIQUE: Multidetector CT imaging of the abdomen and pelvis was performed following the standard protocol without IV contrast. Sagittal and coronal MPR images reconstructed from axial data set. No oral contrast. COMPARISON:  12/12/2011 FINDINGS: Lower chest: Lung bases clear Hepatobiliary: Gallbladder surgically absent.  Liver unremarkable. Pancreas: Normal appearance Spleen: Normal appearance Adrenals/Urinary Tract: Adrenal glands unremarkable. 3 mm nonobstructing RIGHT renal calculus. 2.8 x 2.3 cm diameter  cyst at posterior RIGHT kidney, slightly increased from prior exam. No additional renal mass or hydronephrosis. Ureters poorly visualized. Bladder contains minimal urine. Stomach/Bowel: Scattered stool throughout colon. Normal appendix. Prior gastric bypass surgery with small reservoir. No focal bowel abnormalities. Vascular/Lymphatic: Atherosclerotic calcifications aorta. Aorta normal caliber. No definite adenopathy visualized though retroperitoneal back and pelvic tissue planes are poorly defined limiting assessment. Reproductive: Unremarkable uterus. Other: No free air or free fluid.  No hernia. Musculoskeletal: Cystic degenerative changes at the femoral heads. No focal osseous lesions otherwise seen. IMPRESSION: 3 mm nonobstructing RIGHT renal calculus. 2.8 cm RIGHT renal cyst, slightly increased. Prior gastric bypass surgery. No definite acute intra-abdominal or intrapelvic process. Electronically Signed    By: Lavonia Dana M.D.   On: 03/30/2019 16:43   Dg Chest 2 View  Result Date: 04/04/2019 CLINICAL DATA:  Questionable focus of abnormal osseous tracer accumulation of the posterior left fourth rib on bone scan. EXAM: CHEST - 2 VIEW COMPARISON:  Chest x-ray November 26, 2015, bone scan March 30, 2019 FINDINGS: The mediastinal contour and cardiac silhouette are normal. The lungs are clear. The bony structures are normal. No focal abnormalities identified in the left fourth rib. IMPRESSION: No acute cardiopulmonary disease identified. No focal abnormalities identified in the left fourth rib. Electronically Signed   By: Abelardo Diesel M.D.   On: 04/04/2019 20:26   Dg Ribs Unilateral Left  Result Date: 04/04/2019 CLINICAL DATA:  Abnormal 4th rib on prior bone scan. EXAM: LEFT RIBS - 2 VIEW COMPARISON:  Bone scan 03/30/2019.  Chest x-ray today. FINDINGS: No fracture or other bone lesions are seen involving the ribs. In particular, no abnormality visualized in the region of the 4th rib. IMPRESSION: Negative. Electronically Signed   By: Rolm Baptise M.D.   On: 04/04/2019 20:34   Nm Bone Scan Whole Body  Result Date: 03/30/2019 CLINICAL DATA:  Back pain, elevated LFTs, elevated alkaline phosphatase, suspect a spondyloarthropathy EXAM: NUCLEAR MEDICINE WHOLE BODY BONE SCAN TECHNIQUE: Whole body anterior and posterior images were obtained approximately 3 hours after intravenous injection of radiopharmaceutical. RADIOPHARMACEUTICALS:  22.76 mCi Technetium-39m MDP IV COMPARISON:  None Radiographic correlation: CT abdomen pelvis 03/30/2019, chest radiograph 11/26/2015 FINDINGS: Mild diffusely increased tracer accumulation throughout osseous structures of the axial and appendicular skeleton. No focal sites of abnormal osseous tracer accumulation are definitely visualized. Questionable abnormal tracer accumulation at the posterior LEFT fourth rib. No abnormal findings at the posterior LEFT fourth rib on remote chest radiograph.  Pain faint visualization of the kidneys. Small amount of excreted tracer within urinary bladder and within scattered soft tissues. IMPRESSION: Single questionable focus of abnormal osseous tracer accumulation at the posterior LEFT fourth rib, recommend correlation with chest radiograph. Increased uptake of tracer diffusely throughout the skeleton with relatively decreased soft tissue and renal localization of tracer, raising question of a diffuse metabolic bone disease; causes would include hyperparathyroidism, hypervitaminosis D, renal osteodystrophy, osteomalacia, and multiple other causes. Diffuse metastatic disease can cause this appearance though this is considered unlikely in a patient without a history of malignancy and without focal osseous abnormalities by CT. Electronically Signed   By: Lavonia Dana M.D.   On: 03/30/2019 17:22   Mm 3d Screen Breast Bilateral  Result Date: 03/11/2019 CLINICAL DATA:  Screening. EXAM: DIGITAL SCREENING BILATERAL MAMMOGRAM WITH TOMO AND CAD COMPARISON:  Previous exam(s). ACR Breast Density Category b: There are scattered areas of fibroglandular density. FINDINGS: There are no findings suspicious for malignancy. Images were processed with CAD. IMPRESSION: No mammographic  evidence of malignancy. A result letter of this screening mammogram will be mailed directly to the patient. RECOMMENDATION: Screening mammogram in one year. (Code:SM-B-01Y) BI-RADS CATEGORY  1: Negative. Electronically Signed   By: Britta MccreedySusan  Turner M.D.   On: 03/11/2019 14:17    Assessment  Diagnoses of Psoriatic arthritis (HCC), Chronic pain syndrome, and Chronic pain of both shoulders were pertinent to this visit.  Plan of Care  I am having Barbara ChannelLisa E. Meyers maintain her multivitamin, omeprazole, diclofenac sodium, fluticasone, PARoxetine, iron dextran complex in sodium chloride 0.9 % 500 mL, tiZANidine, gabapentin, and traMADol.  General Recommendations: The pain condition that the patient suffers  from is best treated with a multidisciplinary approach that involves an increase in physical activity to prevent de-conditioning and worsening of the pain cycle, as well as psychological counseling (formal and/or informal) to address the co-morbid psychological affects of pain. Treatment will often involve judicious use of pain medications and interventional procedures to decrease the pain, allowing the patient to participate in the physical activity that will ultimately produce long-lasting pain reductions. The goal of the multidisciplinary approach is to return the patient to a higher level of overall function and to restore their ability to perform activities of daily living.  Continue Gabapentin and Diclofenac as prescribed as part of multimodal pain regimen.  Pharmacotherapy (Medications Ordered): Meds ordered this encounter  Medications  . traMADol (ULTRAM) 50 MG tablet    Sig: Take 1 tablet (50 mg total) by mouth 3 (three) times daily as needed. FOR SHOULDER OR ARTHRITIS PAIN    Dispense:  90 tablet    Refill:  2    Generic ok   Follow-up plan:   Return if symptoms worsen or fail to improve.        Recent Visits Date Type Provider Dept  01/27/19 Office Visit Edward JollyLateef, Makilah Dowda, MD [facility]  Showing recent visits within past 90 days and meeting all other requirements   Today's Visits Date Type Provider Dept  04/07/19 Office Visit Edward JollyLateef, Rian Busche, MD [facility]  Showing today's visits and meeting all other requirements   Future Appointments No visits were found meeting these conditions.  Showing future appointments within next 90 days and meeting all other requirements   I discussed the assessment and treatment plan with the patient. The patient was provided an opportunity to ask questions and all were answered. The patient agreed with the plan and demonstrated an understanding of the instructions.  Patient advised to call back or seek an in-person evaluation if  the symptoms or condition worsens.  Total duration of non-face-to-face encounter: 15minutes.  Note by: Edward JollyBilal Ellin Fitzgibbons, MD Date: 04/07/2019; Time: 2:18 PM  Note: This dictation was prepared with Dragon dictation. Any transcriptional errors that may result from this process are unintentional.  Disclaimer:  * Given the special circumstances of the COVID-19 pandemic, the federal government has announced that the Office for Civil Rights (OCR) will exercise its enforcement discretion and will not impose penalties on physicians using telehealth in the event of noncompliance with regulatory requirements under the [employer] Insurance Portability and Accountability Act (HIPAA) in connection with the good faith provision of telehealth during the COVID-19 national public health emergency. (AMA)

## 2019-04-11 ENCOUNTER — Encounter: Payer: Self-pay | Admitting: Oncology

## 2019-04-18 ENCOUNTER — Other Ambulatory Visit (INDEPENDENT_AMBULATORY_CARE_PROVIDER_SITE_OTHER): Payer: Federal, State, Local not specified - PPO

## 2019-04-18 ENCOUNTER — Encounter: Payer: Self-pay | Admitting: Internal Medicine

## 2019-04-18 ENCOUNTER — Other Ambulatory Visit: Payer: Self-pay

## 2019-04-18 DIAGNOSIS — D696 Thrombocytopenia, unspecified: Secondary | ICD-10-CM | POA: Diagnosis not present

## 2019-04-18 DIAGNOSIS — Z20822 Contact with and (suspected) exposure to covid-19: Secondary | ICD-10-CM

## 2019-04-18 DIAGNOSIS — Z20828 Contact with and (suspected) exposure to other viral communicable diseases: Secondary | ICD-10-CM | POA: Diagnosis not present

## 2019-04-18 DIAGNOSIS — R748 Abnormal levels of other serum enzymes: Secondary | ICD-10-CM

## 2019-04-18 LAB — CBC WITH DIFFERENTIAL/PLATELET
Basophils Absolute: 0 10*3/uL (ref 0.0–0.1)
Basophils Relative: 0.2 % (ref 0.0–3.0)
Eosinophils Absolute: 0.1 10*3/uL (ref 0.0–0.7)
Eosinophils Relative: 1.3 % (ref 0.0–5.0)
HCT: 38.2 % (ref 36.0–46.0)
Hemoglobin: 12.2 g/dL (ref 12.0–15.0)
Lymphocytes Relative: 24.8 % (ref 12.0–46.0)
Lymphs Abs: 2.2 10*3/uL (ref 0.7–4.0)
MCHC: 31.8 g/dL (ref 30.0–36.0)
MCV: 86.5 fl (ref 78.0–100.0)
Monocytes Absolute: 0.4 10*3/uL (ref 0.1–1.0)
Monocytes Relative: 4.8 % (ref 3.0–12.0)
Neutro Abs: 6 10*3/uL (ref 1.4–7.7)
Neutrophils Relative %: 68.9 % (ref 43.0–77.0)
Platelets: 143 10*3/uL — ABNORMAL LOW (ref 150.0–400.0)
RBC: 4.42 Mil/uL (ref 3.87–5.11)
RDW: 13.4 % (ref 11.5–15.5)
WBC: 8.8 10*3/uL (ref 4.0–10.5)

## 2019-04-18 LAB — COMPREHENSIVE METABOLIC PANEL
ALT: 50 U/L — ABNORMAL HIGH (ref 0–35)
AST: 43 U/L — ABNORMAL HIGH (ref 0–37)
Albumin: 4 g/dL (ref 3.5–5.2)
Alkaline Phosphatase: 143 U/L — ABNORMAL HIGH (ref 39–117)
BUN: 20 mg/dL (ref 6–23)
CO2: 23 mEq/L (ref 19–32)
Calcium: 8.5 mg/dL (ref 8.4–10.5)
Chloride: 112 mEq/L (ref 96–112)
Creatinine, Ser: 1.16 mg/dL (ref 0.40–1.20)
GFR: 59.63 mL/min — ABNORMAL LOW (ref >60.00)
Glucose, Bld: 90 mg/dL (ref 70–99)
Potassium: 3.9 mEq/L (ref 3.5–5.1)
Sodium: 144 mEq/L (ref 135–145)
Total Bilirubin: 0.5 mg/dL (ref 0.2–1.2)
Total Protein: 6.1 g/dL (ref 6.0–8.3)

## 2019-04-19 LAB — SAR COV2 SEROLOGY (COVID19)AB(IGG),IA: SARS-CoV-2 Ab, IgG: NEGATIVE

## 2019-04-22 ENCOUNTER — Other Ambulatory Visit: Payer: Self-pay | Admitting: Internal Medicine

## 2019-04-22 ENCOUNTER — Other Ambulatory Visit: Payer: Self-pay | Admitting: Gastroenterology

## 2019-04-22 ENCOUNTER — Encounter: Payer: Self-pay | Admitting: Internal Medicine

## 2019-04-22 DIAGNOSIS — F419 Anxiety disorder, unspecified: Secondary | ICD-10-CM

## 2019-04-22 DIAGNOSIS — R748 Abnormal levels of other serum enzymes: Secondary | ICD-10-CM

## 2019-04-22 MED ORDER — LORAZEPAM 0.5 MG PO TABS: 0.2500 mg | ORAL_TABLET | Freq: Three times a day (TID) | ORAL | 0 refills | Status: DC

## 2019-04-22 NOTE — Progress Notes (Signed)
Anxiety increased since only living son missing since 04/08/19 this was on fox8 new 04/21/19 shes on paxil 40 mg since other son died 39 years ago  -will Rx ativan 0.5 1/2 to 1 tab pen   Elevated lfts  -shes taking black seed oil was on sea moss but stopped and Alive vitamin for women  -rec stop all supplements and zanaflex and gabapentin which have side effect of elevated lfts and repeat lfts in 1-2 months call back for lab appt   [employer]

## 2019-04-25 ENCOUNTER — Ambulatory Visit: Payer: Federal, State, Local not specified - PPO | Admitting: Gastroenterology

## 2019-04-26 ENCOUNTER — Telehealth: Payer: Self-pay | Admitting: Internal Medicine

## 2019-04-26 NOTE — Telephone Encounter (Signed)
Called pt to express condolences for her only living son found dead 28-Apr-2019 after it had been > 2 weeks he was missing agreeable to do FMLA paperwork  She is on vacation this week but likely will write out for 2 months   15 years ago when her other son died she was out of work x 9 months  rec she contact her [facility] therapist   [employer]

## 2019-04-27 DIAGNOSIS — L409 Psoriasis, unspecified: Secondary | ICD-10-CM | POA: Diagnosis not present

## 2019-04-27 DIAGNOSIS — M2550 Pain in unspecified joint: Secondary | ICD-10-CM | POA: Diagnosis not present

## 2019-04-27 DIAGNOSIS — R5383 Other fatigue: Secondary | ICD-10-CM | POA: Diagnosis not present

## 2019-04-27 DIAGNOSIS — E663 Overweight: Secondary | ICD-10-CM | POA: Diagnosis not present

## 2019-04-28 ENCOUNTER — Other Ambulatory Visit: Payer: Self-pay | Admitting: Gastroenterology

## 2019-04-28 ENCOUNTER — Encounter: Payer: Self-pay | Admitting: Internal Medicine

## 2019-05-03 ENCOUNTER — Telehealth: Payer: Self-pay | Admitting: Internal Medicine

## 2019-05-03 MED ORDER — OMEPRAZOLE 40 MG PO CPDR: 40.0000 mg | DELAYED_RELEASE_CAPSULE | Freq: Two times a day (BID) | ORAL | 0 refills | Status: DC

## 2019-05-03 NOTE — Telephone Encounter (Signed)
I just sent omeprazole 40 mg 2 times a day on 04/28/2019 to her [name].  It was a 30-day supply.  Please call patient and confirm why she needs another refill.  I do not want to send another prescription yet  Rohini Vanga

## 2019-05-03 NOTE — Telephone Encounter (Signed)
Dr. Marius Ditch, see below and advise

## 2019-05-03 NOTE — Addendum Note (Signed)
Addended byGlennie Isle E on: 05/03/2019 04:48 PM   Modules accepted: Orders

## 2019-05-03 NOTE — Telephone Encounter (Signed)
Paperwork given to PCP.  

## 2019-05-03 NOTE — Telephone Encounter (Signed)
Pt dropped off FMLA paper work to be completed. Paper work is up front in [employer]

## 2019-05-03 NOTE — Telephone Encounter (Signed)
Pt was only requesting a 90 day supply. Your refill should be appropriate.

## 2019-05-19 DIAGNOSIS — Z0279 Encounter for issue of other medical certificate: Secondary | ICD-10-CM

## 2019-05-20 ENCOUNTER — Other Ambulatory Visit: Payer: Self-pay

## 2019-05-20 ENCOUNTER — Ambulatory Visit (INDEPENDENT_AMBULATORY_CARE_PROVIDER_SITE_OTHER): Payer: Federal, State, Local not specified - PPO | Admitting: Family Medicine

## 2019-05-20 DIAGNOSIS — Z20822 Contact with and (suspected) exposure to covid-19: Secondary | ICD-10-CM

## 2019-05-20 DIAGNOSIS — Z20828 Contact with and (suspected) exposure to other viral communicable diseases: Secondary | ICD-10-CM

## 2019-05-20 DIAGNOSIS — R6889 Other general symptoms and signs: Secondary | ICD-10-CM | POA: Diagnosis not present

## 2019-05-20 DIAGNOSIS — R0981 Nasal congestion: Secondary | ICD-10-CM

## 2019-05-20 NOTE — Progress Notes (Signed)
Patient ID: Barbara Faulkner, female   DOB: September 27, 1965, 53 y.o.   MRN: [id number]    Virtual Visit via video Note  This visit type was conducted due to national recommendations for restrictions regarding the COVID-19 pandemic (e.g. social distancing).  This format is felt to be most appropriate for this patient at this time.  All issues noted in this document were discussed and addressed.  No physical exam was performed (except for noted visual exam findings with Video Visits).   I connected with Barbara Faulkner today at 10:40 AM EDT by a video enabled telemedicine application and verified that I am speaking with the correct person using two identifiers. Location patient: home Location provider: work or home office Persons participating in the virtual visit: patient, provider  I discussed the limitations, risks, security and privacy concerns of performing an evaluation and management service by videos and the availability of in person appointments. I also discussed with the patient that there may be a patient responsible charge related to this service. The patient expressed understanding and agreed to proceed.   HPI:  Patient and I connected via video due to complaint of sinus congestion for 1 day.  Patient does report history of seasonal allergies, took allergy medicine x1 dose starting yesterday, only minimal effect.  Does have Flonase nasal spray at home, but has not been using regularly.  Denies fever or chills.  Denies cough, shortness of breath or wheezing.  Denies body aches.  Denies nausea, vomiting or diarrhea.  Patient unsure if she has been around anyone else who has been sick; states it is possible she was exposed to someone else who is been ill.   ROS: See pertinent positives and negatives per HPI.  Past Medical History:  Diagnosis Date  . Allergy   . Chronic pain   . Chronic sinusitis   . Depression   . Heart murmur   . Hiatal hernia   . HPV (human papilloma virus) anogenital  infection   . Migraine   . Psoriatic arthritis [facility])     Past Surgical History:  Procedure Laterality Date  . BREAST BIOPSY Right 01/28/2018   US guided biopsy - heart shaped  . CHOLECYSTECTOMY  2009  . COLONOSCOPY WITH PROPOFOL N/A 07/19/2018   Procedure: COLONOSCOPY WITH PROPOFOL;  Surgeon: Toney Reil, MD;  [facility]COPY;  Service: Gastroenterology;  Laterality: N/A;  . ESOPHAGOGASTRODUODENOSCOPY (EGD) WITH PROPOFOL N/A 07/19/2018   Procedure: ESOPHAGOGASTRODUODENOSCOPY (EGD) WITH PROPOFOL;  Surgeon: Toney Reil, MD;  Location: [facility] ENDOSCOPY;  Service: Gastroenterology;  Laterality: N/A;  . GASTRIC BYPASS     2015; duodenal switch   . LAPAROSCOPIC GASTRIC BANDING  2008    removed 2009  . TUBAL LIGATION  1997    Family History  Problem Relation Faulkner of Onset  . HIV Father   . Hypertension Mother   . Hyperlipidemia Mother   . Lung cancer Maternal Grandfather        smoker  . Hepatitis Maternal Uncle        drug use  . Colon cancer Neg Hx   . Colon polyps Neg Hx   . Rectal cancer Neg Hx   . Stomach cancer Neg Hx   . Breast cancer Neg Hx     Social History   Tobacco Use  . Smoking status: Former Smoker    Packs/day: 2.00    Years: 20.00    Pack years: 40.00    Types: Cigarettes    Quit date:  09/02/2003    Years since quitting: 15.7  . Smokeless tobacco: Never Used  Substance Use Topics  . Alcohol use: No    Alcohol/week: 0.0 standard drinks    Current Outpatient Medications:  .  diclofenac sodium (VOLTAREN) 1 % GEL, Apply topically 4 (four) times daily., Disp: , Rfl:  .  fluticasone (FLONASE) 50 MCG/ACT nasal spray, Place 2 sprays into both nostrils daily. Prn, Disp: 16 g, Rfl: 6 .  gabapentin (NEURONTIN) 300 MG capsule, Take 1 capsule (300 mg total) by mouth at bedtime., Disp: 30 capsule, Rfl: 2 .  iron dextran complex in sodium chloride 0.9 % 500 mL, Inject 2,000 mg into the vein every 30 (thirty) days., Disp: , Rfl:  .  LORazepam  (ATIVAN) 0.5 MG tablet, Take 0.5-1 tablets (0.25-0.5 mg total) by mouth every 8 (eight) hours., Disp: 15 tablet, Rfl: 0 .  Multiple Vitamin (MULTIVITAMIN) tablet, Take 1 tablet by mouth daily., Disp: , Rfl:  .  omeprazole (PRILOSEC) 40 MG capsule, Take 1 capsule (40 mg total) by mouth 2 (two) times daily., Disp: 180 capsule, Rfl: 0 .  PARoxetine (PAXIL) 40 MG tablet, TAKE 1 TABLET(40 MG) BY MOUTH DAILY, Disp: 90 tablet, Rfl: 3 .  tiZANidine (ZANAFLEX) 4 MG capsule, Take 1 capsule (4 mg total) by mouth at bedtime as needed for muscle spasms (leg cramps)., Disp: 30 capsule, Rfl: 2 .  traMADol (ULTRAM) 50 MG tablet, Take 1 tablet (50 mg total) by mouth 3 (three) times daily as needed. FOR SHOULDER OR ARTHRITIS PAIN, Disp: 90 tablet, Rfl: 2  EXAM:  GENERAL: alert, oriented, appears well and in no acute distress  HEENT: atraumatic, conjunttiva clear, no obvious abnormalities on inspection of external nose and ears  NECK: normal movements of the head and neck  LUNGS: on inspection no signs of respiratory distress, breathing rate appears normal, no obvious gross SOB, gasping or wheezing  CV: no obvious cyanosis  MS: moves all visible extremities without noticeable abnormality  PSYCH/NEURO: pleasant and cooperative, no obvious depression or anxiety, speech and thought processing grossly intact  ASSESSMENT AND PLAN:  Discussed the following assessment and plan:  Suspected COVID-19 virus infection, sinus congestion -  Patient given the address of testing site.  Patient advised that testing is taking 2 to 7 days to result, and while we are awaiting results patient must remain under self quarantine and monitor for any changing/worsening symptoms.  Advised over-the-counter medications such as Tylenol can be used to help treat pain or fevers, Robitussin can be used to help calm cough, allergy medication such as Claritin or Allegra can help reduce congestion.  Also discussed getting plenty of rest and  increasing fluid intake.  Made patient aware that test results as well as how his symptoms progress will determine when the self quarantine will be able to end.  Also advised to monitor self for any worsening symptoms, advised if severe shortness of breath develops, high fever that is not reduced with use of Tylenol, chest pain, severe vomiting or diarrhea  --patient must call on-call and or go to ER right away for evaluation. patient verbalized understanding of these instructions.  Advised symptoms could be related to common cold virus and/or seasonal allergies but hard to determine - so we will test for COVID  Also advised patient that 1 day of sinus congestion does not meet criteria for sinusitis diagnosis.  Advise oftentimes sinusitis can be circumvented by aggressive use of allergy medication, saline nasal rinses and Flonase nasal spray to  help open up the sinuses.    I discussed the assessment and treatment plan with the patient. The patient was provided an opportunity to ask questions and all were answered. The patient agreed with the plan and demonstrated an understanding of the instructions.   The patient was advised to call back or seek an in-person evaluation if the symptoms worsen or if the condition fails to improve as anticipated.   Tracey HarriesLauren M Ariele Vidrio, FNP

## 2019-05-21 LAB — NOVEL CORONAVIRUS, NAA: SARS-CoV-2, NAA: NOT DETECTED

## 2019-05-23 ENCOUNTER — Encounter: Payer: Self-pay | Admitting: Internal Medicine

## 2019-05-27 ENCOUNTER — Ambulatory Visit (INDEPENDENT_AMBULATORY_CARE_PROVIDER_SITE_OTHER): Payer: Federal, State, Local not specified - PPO | Admitting: Internal Medicine

## 2019-05-27 ENCOUNTER — Encounter: Payer: Self-pay | Admitting: Internal Medicine

## 2019-05-27 ENCOUNTER — Other Ambulatory Visit: Payer: Self-pay

## 2019-05-27 DIAGNOSIS — G4700 Insomnia, unspecified: Secondary | ICD-10-CM | POA: Diagnosis not present

## 2019-05-27 DIAGNOSIS — F4323 Adjustment disorder with mixed anxiety and depressed mood: Secondary | ICD-10-CM

## 2019-05-27 DIAGNOSIS — R45851 Suicidal ideations: Secondary | ICD-10-CM

## 2019-05-27 MED ORDER — TRAZODONE HCL 50 MG PO TABS: 50.0000 mg | ORAL_TABLET | Freq: Every evening | ORAL | 2 refills | Status: DC | PRN

## 2019-05-27 NOTE — Progress Notes (Addendum)
Virtual Visit via Video Note  I connected with Barbara Faulkner  on 05/27/19 at  9:00 AM EDT by a video enabled telemedicine application and verified that I am speaking with the correct person using two identifiers.  Location patient: home Location provider:work or home office Persons participating in the virtual visit: patient, provider  I discussed the limitations of evaluation and management by telemedicine and the availability of in person appointments. The patient expressed understanding and agreed to proceed.   HPI: 1. C/o insomnia 3 hrs sleep worsening depression/anxiety with SI w/o plan after only living son killed himself. She has been drinking etoh at [place] to copy taking paxil 40 mg qd though received notice from insurance nonadherance   She will be out of work on [employer] until 07/04/19    ROS: See pertinent positives and negatives per HPI.  Past Medical History:  Diagnosis Date  . Allergy   . Chronic pain   . Chronic sinusitis   . Depression   . Heart murmur   . Hiatal hernia   . HPV (human papilloma virus) anogenital infection   . Migraine   . Psoriatic arthritis [facility](HCC)     Past Surgical History:  Procedure Laterality Date  . BREAST BIOPSY Right 01/28/2018   US guided biopsy - heart shaped  . CHOLECYSTECTOMY  2009  . COLONOSCOPY WITH PROPOFOL N/A 07/19/2018   Procedure: COLONOSCOPY WITH PROPOFOL;  Surgeon: Toney ReilVanga, Rohini Reddy, MD;  [facility];  Service: Gastroenterology;  Laterality: N/A;  . ESOPHAGOGASTRODUODENOSCOPY (EGD) WITH PROPOFOL N/A 07/19/2018   Procedure: ESOPHAGOGASTRODUODENOSCOPY (EGD) WITH PROPOFOL;  Surgeon: Toney ReilVanga, Rohini Reddy, MD;  [facility];  Service: Gastroenterology;  Laterality: N/A;  . GASTRIC BYPASS     2015; duodenal switch   . LAPAROSCOPIC GASTRIC BANDING  2008    removed 2009  . TUBAL LIGATION  1997    Family History  Problem Relation Age of Onset  . HIV Father   . Hypertension Mother   . Hyperlipidemia  Mother   . Lung cancer Maternal Grandfather        smoker  . Hepatitis Maternal Uncle        drug use  . Colon cancer Neg Hx   . Colon polyps Neg Hx   . Rectal cancer Neg Hx   . Stomach cancer Neg Hx   . Breast cancer Neg Hx     SOCIAL HX:  Married. 1 child son died 2020 due to Suicide, 6 grandchildren 2nd son died 2005 MVA Works for the [employer]. Enjoys reading and exercising.  Has 2 dogs and 2 cats  Current Outpatient Medications:  .  diclofenac sodium (VOLTAREN) 1 % GEL, Apply topically 4 (four) times daily., Disp: , Rfl:  .  fluticasone (FLONASE) 50 MCG/ACT nasal spray, Place 2 sprays into both nostrils daily. Prn, Disp: 16 g, Rfl: 6 .  gabapentin (NEURONTIN) 300 MG capsule, Take 1 capsule (300 mg total) by mouth at bedtime., Disp: 30 capsule, Rfl: 2 .  iron dextran complex in sodium chloride 0.9 % 500 mL, Inject 2,000 mg into the vein every 30 (thirty) days., Disp: , Rfl:  .  LORazepam (ATIVAN) 0.5 MG tablet, Take 0.5-1 tablets (0.25-0.5 mg total) by mouth every 8 (eight) hours., Disp: 15 tablet, Rfl: 0 .  Multiple Vitamin (MULTIVITAMIN) tablet, Take 1 tablet by mouth daily., Disp: , Rfl:  .  omeprazole (PRILOSEC) 40 MG capsule, Take 1 capsule (40 mg total) by mouth 2 (two) times daily., Disp: 180 capsule,  Rfl: 0 .  PARoxetine (PAXIL) 40 MG tablet, TAKE 1 TABLET(40 MG) BY MOUTH DAILY, Disp: 90 tablet, Rfl: 3 .  tiZANidine (ZANAFLEX) 4 MG capsule, Take 1 capsule (4 mg total) by mouth at bedtime as needed for muscle spasms (leg cramps)., Disp: 30 capsule, Rfl: 2 .  traMADol (ULTRAM) 50 MG tablet, Take 1 tablet (50 mg total) by mouth 3 (three) times daily as needed. FOR SHOULDER OR ARTHRITIS PAIN, Disp: 90 tablet, Rfl: 2 .  traZODone (DESYREL) 50 MG tablet, Take 1-2 tablets (50-100 mg total) by mouth at bedtime as needed for sleep., Disp: 60 tablet, Rfl: 2  EXAM:  VITALS per patient if applicable:  GENERAL: alert, oriented, appears well and in no acute distress  HEENT:  atraumatic, conjunttiva clear, no obvious abnormalities on inspection of external nose and ears  NECK: normal movements of the head and neck  LUNGS: on inspection no signs of respiratory distress, breathing rate appears normal, no obvious gross SOB, gasping or wheezing  CV: no obvious cyanosis  MS: moves all visible extremities without noticeable abnormality  PSYCH/NEURO: pleasant and cooperative, no obvious depression or anxiety, speech and thought processing grossly intact Tearful on exam   ASSESSMENT AND PLAN:  Discussed the following assessment and plan:  Insomnia, unspecified type - Plan: traZODone (DESYREL) 50 MG tablet, Ambulatory referral to Psychiatry, Ambulatory referral to Psychology SEL group Pt request no appts 10/9-10/19   Adjustment reaction with anxiety and depression - Plan: Ambulatory referral to Psychiatry, Ambulatory referral to Psychology -out of work until 07/04/2019 due to this   Suicidal ideation w/o plan - Plan: Ambulatory referral to Psychiatry, Ambulatory referral to Psychology  HM Had flu due if not had 2020  Tdap utd  Consider shingrix in future   Pap12/24/19 +HPV negative pap refer to ob/GYN in 08/2019   abnormal mammogrambx neg necrosis and cystic change due 01/19/19 nl breast exam right axillary lymph node  -mammo 03/11/19 negative   EGD/colonoscopy 07/19/18 ext hemorrhoids erosive gastritis bx mild gastritis  Former smoker quit 15 years ago smoked 10-13 years 1ppd rec healthy diet and exercise   04/27/19 saw Dr. Dierdre Forth initially tx'ed psoriatic arthritis 2014 Dr. Koren Bound took MTX x 2 months w/o help hand Xray no evidence of erosions of bone no tx requreed and f/u pain clinic  Consider paxil fo cymbalta or gabapentin to lyrica which could be more beneficial per note  F/u in 6 months   08/04/2019 and 08/12/19 Dr. [name] Anxiety/depression zoloft 100 mg qd, seroquel 100 mg qd A1C 4.6, TC 111, cont SEL therapy   Psych 08/29/19  depression/anxiety passive SI since 04/2019 no danger to self or others  Increase zoloft 150 mg qd, seroquel 100 mg qd, rec SEL therapy   09/09/19 psych appt Dr. 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 [name] f/u recurrent depression/anxiety, insomnia son committed suicide 04/2019 chronic intermittent passive SI/thoughts no active thoughts no danger to herself phq9 score 3 mood stable cont zoloft 150 mg qd seroquel 100 mg qhs A1C 4.6 TC 111, TSH b12 folic acid normal   -we discussed possible serious and likely etiologies, options for evaluation and workup, limitations of telemedicine visit vs in person visit, treatment, treatment risks and precautions. Pt prefers to treat via telemedicine empirically rather then risking or undertaking an in person visit at this moment. Patient agrees to seek prompt in  person care if worsening, new symptoms arise, or if is not improving with treatment.   I discussed the assessment and treatment plan with the patient. The patient was provided an opportunity to ask questions and all were answered. The patient agreed with the plan and demonstrated an understanding of the instructions.   The patient was advised to call back or seek an in-person evaluation if the symptoms worsen or if the condition fails to improve as anticipated.  Time spent 25 minutes  Delorise Jackson, MD

## 2019-05-27 NOTE — Progress Notes (Signed)
Pre visit review using our clinic review tool, if applicable. No additional management support is needed unless otherwise documented below in the visit note. 

## 2019-06-01 ENCOUNTER — Ambulatory Visit: Payer: Federal, State, Local not specified - PPO | Admitting: Internal Medicine

## 2019-06-06 DIAGNOSIS — F411 Generalized anxiety disorder: Secondary | ICD-10-CM | POA: Diagnosis not present

## 2019-06-14 ENCOUNTER — Encounter: Payer: Self-pay | Admitting: Internal Medicine

## 2019-06-20 ENCOUNTER — Encounter: Payer: Self-pay | Admitting: Internal Medicine

## 2019-06-20 DIAGNOSIS — F411 Generalized anxiety disorder: Secondary | ICD-10-CM | POA: Diagnosis not present

## 2019-06-27 ENCOUNTER — Other Ambulatory Visit: Payer: Federal, State, Local not specified - PPO

## 2019-06-27 ENCOUNTER — Encounter: Payer: Self-pay | Admitting: Internal Medicine

## 2019-06-29 ENCOUNTER — Other Ambulatory Visit: Payer: Self-pay

## 2019-07-01 ENCOUNTER — Telehealth: Payer: Self-pay | Admitting: Internal Medicine

## 2019-07-01 ENCOUNTER — Other Ambulatory Visit: Payer: Self-pay

## 2019-07-01 ENCOUNTER — Other Ambulatory Visit (INDEPENDENT_AMBULATORY_CARE_PROVIDER_SITE_OTHER): Payer: Federal, State, Local not specified - PPO

## 2019-07-01 ENCOUNTER — Encounter: Payer: Self-pay | Admitting: Internal Medicine

## 2019-07-01 DIAGNOSIS — R748 Abnormal levels of other serum enzymes: Secondary | ICD-10-CM

## 2019-07-01 LAB — HEPATIC FUNCTION PANEL
ALT: 46 U/L — ABNORMAL HIGH (ref 0–35)
AST: 44 U/L — ABNORMAL HIGH (ref 0–37)
Albumin: 4.2 g/dL (ref 3.5–5.2)
Alkaline Phosphatase: 148 U/L — ABNORMAL HIGH (ref 39–117)
Bilirubin, Direct: 0.2 mg/dL (ref 0.0–0.3)
Total Bilirubin: 0.7 mg/dL (ref 0.2–1.2)
Total Protein: 6.1 g/dL (ref 6.0–8.3)

## 2019-07-01 NOTE — Telephone Encounter (Signed)
Inform patient pap due 08/25/19 Would like referral to ob/gyn rec due to last year HPV noted   or [facility]?   [place]

## 2019-07-04 ENCOUNTER — Other Ambulatory Visit: Payer: Self-pay | Admitting: Internal Medicine

## 2019-07-04 DIAGNOSIS — B977 Papillomavirus as the cause of diseases classified elsewhere: Secondary | ICD-10-CM

## 2019-07-04 DIAGNOSIS — Z124 Encounter for screening for malignant neoplasm of cervix: Secondary | ICD-10-CM

## 2019-07-04 DIAGNOSIS — M19211 Secondary osteoarthritis, right shoulder: Secondary | ICD-10-CM | POA: Diagnosis not present

## 2019-07-04 DIAGNOSIS — R748 Abnormal levels of other serum enzymes: Secondary | ICD-10-CM

## 2019-07-04 DIAGNOSIS — G8929 Other chronic pain: Secondary | ICD-10-CM | POA: Diagnosis not present

## 2019-07-04 DIAGNOSIS — M25511 Pain in right shoulder: Secondary | ICD-10-CM | POA: Diagnosis not present

## 2019-07-05 ENCOUNTER — Encounter: Payer: Self-pay | Admitting: *Deleted

## 2019-07-05 NOTE — Telephone Encounter (Signed)
mychart has been sent to patient

## 2019-08-03 ENCOUNTER — Other Ambulatory Visit: Payer: Federal, State, Local not specified - PPO

## 2019-08-03 DIAGNOSIS — F419 Anxiety disorder, unspecified: Secondary | ICD-10-CM | POA: Diagnosis not present

## 2019-08-03 DIAGNOSIS — F5105 Insomnia due to other mental disorder: Secondary | ICD-10-CM | POA: Diagnosis not present

## 2019-08-03 DIAGNOSIS — F333 Major depressive disorder, recurrent, severe with psychotic symptoms: Secondary | ICD-10-CM | POA: Diagnosis not present

## 2019-08-04 ENCOUNTER — Ambulatory Visit: Payer: Federal, State, Local not specified - PPO

## 2019-08-04 ENCOUNTER — Ambulatory Visit: Payer: Federal, State, Local not specified - PPO | Admitting: Oncology

## 2019-08-04 DIAGNOSIS — F411 Generalized anxiety disorder: Secondary | ICD-10-CM | POA: Diagnosis not present

## 2019-08-10 ENCOUNTER — Encounter: Payer: Self-pay | Admitting: Gastroenterology

## 2019-08-10 ENCOUNTER — Other Ambulatory Visit: Payer: Self-pay

## 2019-08-10 ENCOUNTER — Ambulatory Visit (INDEPENDENT_AMBULATORY_CARE_PROVIDER_SITE_OTHER): Payer: Federal, State, Local not specified - PPO | Admitting: Gastroenterology

## 2019-08-10 VITALS — BP 114/79 | HR 79 | Temp 97.0°F | Ht 63.0 in | Wt 175.8 lb

## 2019-08-10 DIAGNOSIS — F419 Anxiety disorder, unspecified: Secondary | ICD-10-CM | POA: Diagnosis not present

## 2019-08-10 DIAGNOSIS — F333 Major depressive disorder, recurrent, severe with psychotic symptoms: Secondary | ICD-10-CM | POA: Diagnosis not present

## 2019-08-10 DIAGNOSIS — R748 Abnormal levels of other serum enzymes: Secondary | ICD-10-CM

## 2019-08-10 NOTE — Progress Notes (Signed)
Gastroenterology Consultation  Referring Provider:     McLean-Scocuzza, French Ana * Primary Care Physician:  McLean-Scocuzza, Pasty Spillers, MD Primary Gastroenterologist:  Dr. Servando Snare     Reason for Consultation:     Abnormal liver enzymes        HPI:   Barbara Faulkner is a 53 y.o. y/o female referred for consultation & management of abnormal liver enzymes by Dr. Judie Grieve, Pasty Spillers, MD. This patient comes in today after being seen in the past by Dr. Allegra Lai for internal hemorrhoids abdominal pain and colon cancer screening. The patient had a colonoscopy done in November 2019 by Dr. Allegra Lai. The patient was referred to me today for a finding of abnormal liver enzymes. The patient's liver enzymes appear to have been elevated for some time and the values were as follows:  Component     Latest Ref Rng & Units 03/26/2016 07/14/2018 03/02/2019 04/18/2019  Alkaline Phosphatase     39 - 117 U/L 93  151 (H) 143 (H)  AST     0 - 37 U/L 34 74 (H) 43 (H) 43 (H)  ALT     0 - 35 U/L 33 64 (H) 51 (H) 50 (H)   Component     Latest Ref Rng & Units 07/01/2019  Alkaline Phosphatase     39 - 117 U/L 148 (H)  AST     0 - 37 U/L 44 (H)  ALT     0 - 35 U/L 46 (H)   The patient also had a CT scan of the abdomen and ultrasound. The ultrasound was normal and the CT scan showed:  IMPRESSION: 3 mm nonobstructing RIGHT renal calculus. 2.8 cm RIGHT renal cyst, slightly increased. Prior gastric bypass surgery. No definite acute intra-abdominal or intrapelvic process.  The patient had some lab sent off for the abnormal liver enzymes including a negative ANA. She also was tested for hepatitis A,B and C which were both negative for acute infection or previous infection. The patient's CK was noted to be elevated at 183 and the GGT was normal. The AMA, SMA and iron studies were also noted to be normal.  The patient denies any alcohol abuse.  She also states that she is been taken off of any herbal medication.  She does  report that she has been under a lot of stress recently because of the suicide of her son.  Past Medical History:  Diagnosis Date   Allergy    Chronic pain    Chronic sinusitis    Depression    Heart murmur    Hiatal hernia    HPV (human papilloma virus) anogenital infection    Migraine    Psoriatic arthritis [facility])     Past Surgical History:  Procedure Laterality Date   BREAST BIOPSY Right 01/28/2018   US guided biopsy - heart shaped   CHOLECYSTECTOMY  2009   COLONOSCOPY WITH PROPOFOL N/A 07/19/2018   Procedure: COLONOSCOPY WITH PROPOFOL;  Surgeon: Toney Reil, MD;  Location: [facility] ENDOSCOPY;  Service: Gastroenterology;  Laterality: N/A;   ESOPHAGOGASTRODUODENOSCOPY (EGD) WITH PROPOFOL N/A 07/19/2018   Procedure: ESOPHAGOGASTRODUODENOSCOPY (EGD) WITH PROPOFOL;  Surgeon: Toney Reil, MD;  Location[facility] ENDOSCOPY;  Service: Gastroenterology;  Laterality: N/A;   GASTRIC BYPASS     2015; duodenal switch    LAPAROSCOPIC GASTRIC BANDING  2008    removed 2009   TUBAL LIGATION  1997    Prior to Admission medications   Medication Sig Start Date End  Date Taking? Authorizing Provider  diclofenac sodium (VOLTAREN) 1 % GEL Apply topically 4 (four) times daily.    [provider]  fluticasone (FLONASE) 50 MCG/ACT nasal spray Place 2 sprays into both nostrils daily. Prn 10/21/18   McLean-Scocuzza, Nino Glow, MD  gabapentin (NEURONTIN) 300 MG capsule Take 1 capsule (300 mg total) by mouth at bedtime. 03/14/19 06/12/19  Gillis Santa, MD  iron dextran complex in sodium chloride 0.9 % 500 mL Inject 2,000 mg into the vein every 30 (thirty) days.    [provider]  LORazepam (ATIVAN) 0.5 MG tablet Take 0.5-1 tablets (0.25-0.5 mg total) by mouth every 8 (eight) hours. 04/22/19   McLean-Scocuzza, Nino Glow, MD  Multiple Vitamin (MULTIVITAMIN) tablet Take 1 tablet by mouth daily.    [provider]  omeprazole (PRILOSEC) 40 MG capsule Take 1  capsule (40 mg total) by mouth 2 (two) times daily. 05/03/19 06/02/19  Lin Landsman, MD  PARoxetine (PAXIL) 40 MG tablet TAKE 1 TABLET(40 MG) BY MOUTH DAILY 01/25/19   McLean-Scocuzza, Nino Glow, MD  tiZANidine (ZANAFLEX) 4 MG capsule Take 1 capsule (4 mg total) by mouth at bedtime as needed for muscle spasms (leg cramps). 01/27/19   Gillis Santa, MD  traMADol (ULTRAM) 50 MG tablet Take 1 tablet (50 mg total) by mouth 3 (three) times daily as needed. FOR SHOULDER OR ARTHRITIS PAIN 04/07/19   Gillis Santa, MD  traZODone (DESYREL) 50 MG tablet Take 1-2 tablets (50-100 mg total) by mouth at bedtime as needed for sleep. 05/27/19   McLean-Scocuzza, Nino Glow, MD    Family History  Problem Relation Age of Onset   HIV Father    Hypertension Mother    Hyperlipidemia Mother    Lung cancer Maternal Grandfather        smoker   Hepatitis Maternal Uncle        drug use   Colon cancer Neg Hx    Colon polyps Neg Hx    Rectal cancer Neg Hx    Stomach cancer Neg Hx    Breast cancer Neg Hx      Social History   Tobacco Use   Smoking status: Former Smoker    Packs/day: 2.00    Years: 20.00    Pack years: 40.00    Types: Cigarettes    Quit date: 09/02/2003    Years since quitting: 15.9   Smokeless tobacco: Never Used  Substance Use Topics   Alcohol use: No    Alcohol/week: 0.0 standard drinks   Drug use: No    Allergies as of 08/10/2019 - Review Complete 05/27/2019  Allergen Reaction Noted   Shellfish allergy Swelling 09/27/2010   Hydrocod polst-cpm polst er Other (See Comments) 09/18/2011    Review of Systems:    All systems reviewed and negative except where noted in HPI.   Physical Exam:  LMP 05/04/2014  Patient's last menstrual period was 05/04/2014. General:   Alert,  Well-developed, well-nourished, pleasant and cooperative in NAD Head:  Normocephalic and atraumatic. Eyes:  Sclera clear, no icterus.   Conjunctiva pink. Ears:  Normal auditory acuity. Neck:  Supple;  no masses or thyromegaly. Lungs:  Respirations even and unlabored.  Clear throughout to auscultation.   No wheezes, crackles, or rhonchi. No acute distress. Heart:  Regular rate and rhythm; no murmurs, clicks, rubs, or gallops. Abdomen:  Normal bowel sounds.  No bruits.  Soft, non-tender and non-distended without masses, hepatosplenomegaly or hernias noted.  No guarding or rebound tenderness.  Negative Carnett  sign.   Rectal:  Deferred.  Msk:  Symmetrical without gross deformities.  Good, equal movement & strength bilaterally. Pulses:  Normal pulses noted. Extremities:  No clubbing or edema.  No cyanosis. Neurologic:  Alert and oriented x3;  grossly normal neurologically. Skin:  Intact without significant lesions or rashes.  No jaundice. Lymph Nodes:  No significant cervical adenopathy. Psych:  Alert and cooperative. Normal mood and affect.  Imaging Studies: No results found.  Assessment and Plan:   Elroy ChannelLisa E Booz is a 53 y.o. y/o female who comes in today with abnormal enzymes for over a year.  The patient had a work-up to rule out an autoimmune disease and common viral hepatitis infections.  The patient will have her blood sent off for alpha-1 antitrypsinase deficiency and ceruloplasmin.  She will also be set up for immunization for both hepatitis A and hepatitis B.  The patient's GGT and liver enzymes will be rechecked.  The cause of her previous CK elevation is unknown.  If her liver enzymes remain elevated she may need to undergo a liver biopsy to rule out any liver pathology.  The patient has been explained the plan and agrees with it.    Midge Miniumarren Leray Garverick, MD. Clementeen GrahamFACG    Note: This dictation was prepared with Dragon dictation along with smaller phrase technology. Any transcriptional errors that result from this process are unintentional.

## 2019-08-11 LAB — HEPATIC FUNCTION PANEL
ALT: 59 IU/L — ABNORMAL HIGH (ref 0–32)
AST: 54 IU/L — ABNORMAL HIGH (ref 0–40)
Albumin: 4.3 g/dL (ref 3.8–4.9)
Alkaline Phosphatase: 173 IU/L — ABNORMAL HIGH (ref 39–117)
Bilirubin Total: 0.8 mg/dL (ref 0.0–1.2)
Bilirubin, Direct: 0.24 mg/dL (ref 0.00–0.40)
Total Protein: 5.9 g/dL — ABNORMAL LOW (ref 6.0–8.5)

## 2019-08-11 LAB — CERULOPLASMIN: Ceruloplasmin: 19.7 mg/dL (ref 19.0–39.0)

## 2019-08-11 LAB — ALPHA-1-ANTITRYPSIN: A-1 Antitrypsin: 126 mg/dL (ref 101–187)

## 2019-08-11 LAB — GAMMA GT: GGT: 28 IU/L (ref 0–60)

## 2019-08-12 DIAGNOSIS — F5105 Insomnia due to other mental disorder: Secondary | ICD-10-CM | POA: Diagnosis not present

## 2019-08-12 DIAGNOSIS — F333 Major depressive disorder, recurrent, severe with psychotic symptoms: Secondary | ICD-10-CM | POA: Diagnosis not present

## 2019-08-12 DIAGNOSIS — F419 Anxiety disorder, unspecified: Secondary | ICD-10-CM | POA: Diagnosis not present

## 2019-08-15 ENCOUNTER — Other Ambulatory Visit: Payer: Self-pay

## 2019-08-15 ENCOUNTER — Telehealth: Payer: Self-pay

## 2019-08-15 DIAGNOSIS — R748 Abnormal levels of other serum enzymes: Secondary | ICD-10-CM

## 2019-08-15 NOTE — Telephone Encounter (Signed)
Pt notified of results via mychart.  

## 2019-08-15 NOTE — Telephone Encounter (Signed)
-----   Message from Lucilla Lame, MD sent at 08/13/2019  8:39 AM EST ----- The patient know that her liver enzymes continue to be elevated with the other labs not showing any cause for her abnormal liver enzymes.  At this point I would recommend a liver biopsy to evaluate the cause of the abnormal liver enzymes.

## 2019-08-23 ENCOUNTER — Other Ambulatory Visit: Payer: Self-pay | Admitting: Radiology

## 2019-08-24 ENCOUNTER — Other Ambulatory Visit: Payer: Self-pay

## 2019-08-24 ENCOUNTER — Ambulatory Visit
Admission: RE | Admit: 2019-08-24 | Discharge: 2019-08-24 | Disposition: A | Discharge status: Home / Self Care | Payer: Federal, State, Local not specified - PPO | Source: Ambulatory Visit | Attending: Gastroenterology | Admitting: Gastroenterology

## 2019-08-24 DIAGNOSIS — Z9851 Tubal ligation status: Secondary | ICD-10-CM | POA: Diagnosis not present

## 2019-08-24 DIAGNOSIS — Z79899 Other long term (current) drug therapy: Secondary | ICD-10-CM | POA: Insufficient documentation

## 2019-08-24 DIAGNOSIS — R252 Cramp and spasm: Secondary | ICD-10-CM | POA: Insufficient documentation

## 2019-08-24 DIAGNOSIS — Z791 Long term (current) use of non-steroidal anti-inflammatories (NSAID): Secondary | ICD-10-CM | POA: Diagnosis not present

## 2019-08-24 DIAGNOSIS — R7989 Other specified abnormal findings of blood chemistry: Secondary | ICD-10-CM | POA: Diagnosis not present

## 2019-08-24 DIAGNOSIS — G8929 Other chronic pain: Secondary | ICD-10-CM | POA: Diagnosis not present

## 2019-08-24 DIAGNOSIS — Z9049 Acquired absence of other specified parts of digestive tract: Secondary | ICD-10-CM | POA: Insufficient documentation

## 2019-08-24 DIAGNOSIS — R748 Abnormal levels of other serum enzymes: Secondary | ICD-10-CM | POA: Insufficient documentation

## 2019-08-24 DIAGNOSIS — Z91013 Allergy to seafood: Secondary | ICD-10-CM | POA: Insufficient documentation

## 2019-08-24 DIAGNOSIS — Z87891 Personal history of nicotine dependence: Secondary | ICD-10-CM | POA: Diagnosis not present

## 2019-08-24 DIAGNOSIS — F329 Major depressive disorder, single episode, unspecified: Secondary | ICD-10-CM | POA: Diagnosis not present

## 2019-08-24 DIAGNOSIS — M19019 Primary osteoarthritis, unspecified shoulder: Secondary | ICD-10-CM | POA: Diagnosis not present

## 2019-08-24 DIAGNOSIS — Z886 Allergy status to analgesic agent status: Secondary | ICD-10-CM | POA: Insufficient documentation

## 2019-08-24 DIAGNOSIS — Z801 Family history of malignant neoplasm of trachea, bronchus and lung: Secondary | ICD-10-CM | POA: Insufficient documentation

## 2019-08-24 DIAGNOSIS — Z9884 Bariatric surgery status: Secondary | ICD-10-CM | POA: Insufficient documentation

## 2019-08-24 DIAGNOSIS — R945 Abnormal results of liver function studies: Secondary | ICD-10-CM | POA: Diagnosis not present

## 2019-08-24 LAB — CBC
HCT: 35.9 % — ABNORMAL LOW (ref 36.0–46.0)
Hemoglobin: 11.8 g/dL — ABNORMAL LOW (ref 12.0–15.0)
MCH: 27.1 pg (ref 26.0–34.0)
MCHC: 32.9 g/dL (ref 30.0–36.0)
MCV: 82.5 fL (ref 80.0–100.0)
Platelets: 174 10*3/uL (ref 150–400)
RBC: 4.35 MIL/uL (ref 3.87–5.11)
RDW: 12.8 % (ref 11.5–15.5)
WBC: 8.5 10*3/uL (ref 4.0–10.5)
nRBC: 0 % (ref 0.0–0.2)

## 2019-08-24 LAB — PROTIME-INR
INR: 1.1 (ref 0.8–1.2)
Prothrombin Time: 14.3 seconds (ref 11.4–15.2)

## 2019-08-24 MED ORDER — FENTANYL CITRATE (PF) 100 MCG/2ML IJ SOLN
INTRAMUSCULAR | Status: AC
  Filled 2019-08-24: qty 2

## 2019-08-24 MED ORDER — FENTANYL CITRATE (PF) 100 MCG/2ML IJ SOLN
INTRAMUSCULAR | Status: AC | PRN
  Administered 2019-08-24 (×2): 50 ug via INTRAVENOUS

## 2019-08-24 MED ORDER — MIDAZOLAM HCL 2 MG/2ML IJ SOLN
INTRAMUSCULAR | Status: AC | PRN
  Administered 2019-08-24 (×2): 1 mg via INTRAVENOUS

## 2019-08-24 MED ORDER — MIDAZOLAM HCL 5 MG/5ML IJ SOLN
INTRAMUSCULAR | Status: AC
  Filled 2019-08-24: qty 5

## 2019-08-24 MED ORDER — SODIUM CHLORIDE 0.9 % IV SOLN: INTRAVENOUS | Status: DC

## 2019-08-24 NOTE — Discharge Instructions (Signed)
Liver Biopsy, Care After These instructions give you information about how to care for yourself after your procedure. Your health care provider may also give you more specific instructions. If you have problems or questions, contact your health care provider. What can I expect after the procedure? After your procedure, it is common to have:  Pain and soreness in the area where the biopsy was done.  Bruising around the area where the biopsy was done.  Sleepiness and fatigue for 1-2 days. Follow these instructions at home: Medicines  Take over-the-counter and prescription medicines only as told by your health care provider.  If you were prescribed an antibiotic medicine, take it as told by your health care provider. Do not stop taking the antibiotic even if you start to feel better.  Do not take medicines such as aspirin and ibuprofen unless your health care provider tells you to take them. These medicines thin your blood and can increase the risk of bleeding.  If you are taking prescription pain medicine, take actions to prevent or treat constipation. Your health care provider may recommend that you: ? Drink enough fluid to keep your urine pale yellow. ? Eat foods that are high in fiber, such as fresh fruits and vegetables, whole grains, and beans. ? Limit foods that are high in fat and processed sugars, such as fried or sweet foods. ? Take an over-the-counter or prescription medicine for constipation. Incision care ? Wash your hands with soap and water before you change your bandage (dressing). If soap and water are not available, use hand sanitizer. ? Change your bandage tomorrow after you shower and then remove the next day.  Check your incision area every day for signs of infection. Check for: ? Redness, swelling, or pain. ? Fluid or blood. ? Warmth. ? Pus or a bad smell. You may shower tomorrow  No lifting more than 5 lbs for 3 days  Return to your normal activities as told  by your health care provider.   Do not drive or use heavy machinery for 24hrs or while taking prescription pain medicine.  Do not play contact sports for 2 weeks after the procedure. General instructions   Do not drink alcohol in the first week after the procedure.  Have someone stay with you for at least 24 hours after the procedure.  It is your responsibility to obtain your test results. Ask your health care provider, or the department that is doing the test: ? When will my results be ready? ? How will I get my results? ? What are my treatment options? ? What other tests do I need? ? What are my next steps?  Keep all follow-up visits as told by your health care provider. This is important. Contact a health care provider if:  You have increased bleeding from an incision, resulting in more than a small spot of blood.  You have redness, swelling, or increasing pain in any incisions.  You notice a discharge or a bad smell coming from any of your incisions.  You have a fever or chills. Get help right away if:  You develop swelling, bloating, or pain in your abdomen.  You become dizzy or faint.  You develop a rash.  You have nausea or you vomit.  You faint, or you have shortness of breath or difficulty breathing.  You develop chest pain.  You have problems with your speech or vision.  You have trouble with your balance or moving your arms or legs. Summary    After the liver biopsy, it is common to have pain, soreness, and bruising in the area, as well as sleepiness and fatigue.  Take over-the-counter and prescription medicines only as told by your health care provider.  Follow instructions from your health care provider about how to care for your incision. Check the incision area daily for signs of infection. This information is not intended to replace advice given to you by your health care provider. Make sure you discuss any questions you have with your health care  provider. Document Released: 03/07/2005 Document Revised: 10/11/2018 Document Reviewed: 08/28/2017 Elsevier Patient Education  2020 [employer] 

## 2019-08-24 NOTE — Progress Notes (Signed)
Patient clinically stable post Liver Biopsy per Dr Laurence Ferrari, tolerated well. Vitals stable throughout, alert/awake and oriented post procedure. bandade dressing dry and intact to right mid abdomen.taking po's without difficulty. Report given to [facility] post procedure with questions answered.

## 2019-08-24 NOTE — Procedures (Signed)
Interventional Radiology Procedure Note  Procedure: Random liver biopsy  Complications: None  Estimated Blood Loss: None  Recommendations: - Bedrest x 2 hrs - DC home  Signed,  Gerhart Ruggieri K. Kehaulani Fruin, MD    

## 2019-08-24 NOTE — H&P (Signed)
Chief Complaint: Patient was seen in consultation today for elevated LFTs at the request of [place]  Referring Physician(s): Minkler  Patient Status: [facility] - Out-pt  History of Present Illness: Barbara Faulkner is a 53 y.o. female  Who presents today in her usual state of good health for an US guided random liver biopsy.  She has had persistent elevation of her liver enzymes.  A non invasive workup has thus far been unsuccessful at identifying a cause.  Therefore, liver biopsy is now planned.   She has no acute complaints and denies chest pain, SOB, fever, chills or other symptoms.   Past Medical History:  Diagnosis Date  . Allergy   . Chronic pain   . Chronic sinusitis   . Depression   . Heart murmur   . Hiatal hernia   . HPV (human papilloma virus) anogenital infection   . Migraine   . Psoriatic arthritis [facility])     Past Surgical History:  Procedure Laterality Date  . BREAST BIOPSY Right 01/28/2018   US guided biopsy - heart shaped  . CHOLECYSTECTOMY  2009  . COLONOSCOPY WITH PROPOFOL N/A 07/19/2018   Procedure: COLONOSCOPY WITH PROPOFOL;  Surgeon: Lin Landsman, MD;  Location: [facility] ENDOSCOPY;  Service: Gastroenterology;  Laterality: N/A;  . ESOPHAGOGASTRODUODENOSCOPY (EGD) WITH PROPOFOL N/A 07/19/2018   Procedure: ESOPHAGOGASTRODUODENOSCOPY (EGD) WITH PROPOFOL;  Surgeon: Lin Landsman, MD;  Location: [facility] ENDOSCOPY;  Service: Gastroenterology;  Laterality: N/A;  . GASTRIC BYPASS     2015; duodenal switch   . LAPAROSCOPIC GASTRIC BANDING  2008    removed 2009  . TUBAL LIGATION  1997    Allergies: Shellfish allergy and Hydrocod polst-cpm polst er  Medications: Prior to Admission medications   Medication Sig Start Date End Date Taking? Authorizing Provider  diclofenac sodium (VOLTAREN) 1 % GEL Apply topically 4 (four) times daily.   Yes [provider]  fluticasone (FLONASE) 50 MCG/ACT nasal spray Place 2 sprays into both nostrils  daily. Prn 10/21/18  Yes McLean-Scocuzza, Nino Glow, MD  iron dextran complex in sodium chloride 0.9 % 500 mL Inject 2,000 mg into the vein every 30 (thirty) days.   Yes [provider]  LORazepam (ATIVAN) 0.5 MG tablet Take 0.5-1 tablets (0.25-0.5 mg total) by mouth every 8 (eight) hours. 04/22/19  Yes McLean-Scocuzza, Nino Glow, MD  Multiple Vitamin (MULTIVITAMIN) tablet Take 1 tablet by mouth daily.   Yes [provider]  PARoxetine (PAXIL) 40 MG tablet TAKE 1 TABLET(40 MG) BY MOUTH DAILY 01/25/19  Yes McLean-Scocuzza, Nino Glow, MD  QUEtiapine (SEROQUEL) 100 MG tablet Take 100 mg by mouth at bedtime. 08/03/19  Yes [provider]  sertraline (ZOLOFT) 50 MG tablet Take 50 mg by mouth every morning. 08/03/19  Yes [provider]  tiZANidine (ZANAFLEX) 4 MG capsule Take 1 capsule (4 mg total) by mouth at bedtime as needed for muscle spasms (leg cramps). 01/27/19  Yes Gillis Santa, MD  traMADol (ULTRAM) 50 MG tablet Take 1 tablet (50 mg total) by mouth 3 (three) times daily as needed. FOR SHOULDER OR ARTHRITIS PAIN 04/07/19  Yes Gillis Santa, MD  traZODone (DESYREL) 50 MG tablet Take 1-2 tablets (50-100 mg total) by mouth at bedtime as needed for sleep. 05/27/19  Yes McLean-Scocuzza, Nino Glow, MD  gabapentin (NEURONTIN) 300 MG capsule Take 1 capsule (300 mg total) by mouth at bedtime. 03/14/19 06/12/19  Gillis Santa, MD  omeprazole (PRILOSEC) 40 MG capsule Take 1 capsule (40 mg total) by mouth  2 (two) times daily. 05/03/19 06/02/19  Toney Reil, MD     Family History  Problem Relation Age of Onset  . HIV Father   . Hypertension Mother   . Hyperlipidemia Mother   . Lung cancer Maternal Grandfather        smoker  . Hepatitis Maternal Uncle        drug use  . Colon cancer Neg Hx   . Colon polyps Neg Hx   . Rectal cancer Neg Hx   . Stomach cancer Neg Hx   . Breast cancer Neg Hx     Social History   Socioeconomic History  . Marital status: Married    Spouse  name: Not on file  . Number of children: 2  . Years of education: Not on file  . Highest education level: Not on file  Occupational History  . Occupation: Forensic scientist, Publishing rights manager: [employer]  Tobacco Use  . Smoking status: Former Smoker    Packs/day: 2.00    Years: 20.00    Pack years: 40.00    Types: Cigarettes    Quit date: 09/02/2003    Years since quitting: 15.9  . Smokeless tobacco: Never Used  Substance and Sexual Activity  . Alcohol use: No    Alcohol/week: 0.0 standard drinks  . Drug use: No  . Sexual activity: Yes  Other Topics Concern  . Not on file  Social History Narrative   Married.   1 child son died 01/04/2019 due to Suicide, 6 grandchildren   2nd son died Jan 04, 2004 MVA   Works for the [employer].   Enjoys reading and exercising.    Has 2 dogs and 2 cats   Social Determinants of Health   Financial Resource Strain:   . Difficulty of Paying Living Expenses: Not on file  Food Insecurity:   . Worried About Programme researcher, broadcasting/film/video in the Last Year: Not on file  . Ran Out of Food in the Last Year: Not on file  Transportation Needs:   . Lack of Transportation (Medical): Not on file  . Lack of Transportation (Non-Medical): Not on file  Physical Activity:   . Days of Exercise per Week: Not on file  . Minutes of Exercise per Session: Not on file  Stress:   . Feeling of Stress : Not on file  Social Connections:   . Frequency of Communication with Friends and Family: Not on file  . Frequency of Social Gatherings with Friends and Family: Not on file  . Attends Religious Services: Not on file  . Active Member of Clubs or Organizations: Not on file  . Attends Banker Meetings: Not on file  . Marital Status: Not on file    Review of Systems: A 12 point ROS discussed and pertinent positives are indicated in the HPI above.  All other systems are negative.  Review of Systems  Vital Signs: BP 111/75 (BP Location: Left Arm)   Pulse 80   Temp  98.2 F (36.8 C)   Resp 18   Ht 5\' 3"  (1.6 m)   Wt 81.2 kg   LMP 05/04/2014   SpO2 95%   BMI 31.71 kg/m   Physical Exam Vitals reviewed.  Constitutional:      Appearance: Normal appearance.  HENT:     Head: Normocephalic and atraumatic.  Eyes:     General: No scleral icterus. Cardiovascular:     Rate and Rhythm: Normal rate and regular  rhythm.  Pulmonary:     Effort: Pulmonary effort is normal.     Breath sounds: Normal breath sounds.  Abdominal:     General: Abdomen is flat.     Palpations: Abdomen is soft.  Skin:    General: Skin is warm and dry.  Neurological:     Mental Status: She is alert and oriented to person, place, and time.  Psychiatric:        Mood and Affect: Mood normal.        Behavior: Behavior normal.     Imaging: No results found.  Labs:  CBC: Recent Labs    03/02/19 1346 04/05/19 1309 04/18/19 1354 08/24/19 0745  WBC 8.7 7.5 8.8 8.5  HGB 12.0 11.2* 12.2 11.8*  HCT 38.1 35.5* 38.2 35.9*  PLT 138.0* 144* 143.0* 174    COAGS: Recent Labs    08/24/19 0745  INR 1.1    BMP: Recent Labs    03/02/19 1346 03/10/19 0912 04/18/19 1354  NA 146* 144 144  K 3.6 3.5 3.9  CL 112 113* 112  CO2 27 23 23   GLUCOSE 83 96 90  BUN 16 17 20   CALCIUM 8.3* 8.3* 8.5  CREATININE 1.21* 0.95 1.16    LIVER FUNCTION TESTS: Recent Labs    03/02/19 1346 04/18/19 1354 07/01/19 0910 08/10/19 1016  BILITOT 0.6 0.5 0.7 0.8  AST 43* 43* 44* 54*  ALT 51* 50* 46* 59*  ALKPHOS 151* 143* 148* 173*  PROT 6.2 6.1 6.1 5.9*  ALBUMIN 4.3 4.0 4.2 4.3    TUMOR MARKERS: No results for input(s): AFPTM, CEA, CA199, CHROMGRNA in the last 8760 hours.  Assessment and Plan:  Elevated LFTs of uncertain etiology.   1.) Proceed with US guided random liver biopsy as planned.     Thank you for this interesting consult.  I greatly enjoyed meeting Elroy ChannelLisa E Deweese and look forward to participating in their care.  A copy of this report was sent to the requesting  provider on this date.  Electronically Signed: Malachy MoanHeath , MD 08/24/2019, 9:23 AM   I spent a total of 15 Minutes  in face to face in clinical consultation, greater than 50% of which was counseling/coordinating care for elevated LFTs.

## 2019-08-29 DIAGNOSIS — F419 Anxiety disorder, unspecified: Secondary | ICD-10-CM | POA: Diagnosis not present

## 2019-08-29 DIAGNOSIS — F5105 Insomnia due to other mental disorder: Secondary | ICD-10-CM | POA: Diagnosis not present

## 2019-08-29 DIAGNOSIS — F333 Major depressive disorder, recurrent, severe with psychotic symptoms: Secondary | ICD-10-CM | POA: Diagnosis not present

## 2019-08-29 LAB — SURGICAL PATHOLOGY

## 2019-08-30 ENCOUNTER — Encounter: Payer: Self-pay | Admitting: Internal Medicine

## 2019-08-31 ENCOUNTER — Ambulatory Visit: Payer: Federal, State, Local not specified - PPO | Admitting: Internal Medicine

## 2019-09-05 ENCOUNTER — Telehealth: Payer: Self-pay

## 2019-09-05 ENCOUNTER — Other Ambulatory Visit: Payer: Self-pay | Admitting: Obstetrics and Gynecology

## 2019-09-05 DIAGNOSIS — Z1151 Encounter for screening for human papillomavirus (HPV): Secondary | ICD-10-CM | POA: Diagnosis not present

## 2019-09-05 DIAGNOSIS — Z01419 Encounter for gynecological examination (general) (routine) without abnormal findings: Secondary | ICD-10-CM | POA: Diagnosis not present

## 2019-09-05 LAB — HM PAP SMEAR: HM Pap smear: NEGATIVE

## 2019-09-05 NOTE — Telephone Encounter (Signed)
-----   Message from Midge Minium, MD sent at 09/04/2019  9:41 AM EST ----- Please have the patient come in for a follow up.

## 2019-09-05 NOTE — Telephone Encounter (Signed)
Left vm for pt to return my call to schedule follow up liver biopsy results on Wednesday, Jan 6th.

## 2019-09-05 NOTE — Telephone Encounter (Signed)
-----   Message from Darren Wohl, MD sent at 09/04/2019  9:41 AM EST ----- Please have the patient come in for a follow up. 

## 2019-09-05 NOTE — Telephone Encounter (Signed)
Pt has been scheduled for a follow up appt to discuss liver biopsy results on Wednesday, Jan 6th.

## 2019-09-07 ENCOUNTER — Encounter: Payer: Self-pay | Admitting: Gastroenterology

## 2019-09-07 ENCOUNTER — Ambulatory Visit (INDEPENDENT_AMBULATORY_CARE_PROVIDER_SITE_OTHER): Payer: Federal, State, Local not specified - PPO | Admitting: Gastroenterology

## 2019-09-07 ENCOUNTER — Other Ambulatory Visit: Payer: Self-pay

## 2019-09-07 VITALS — BP 111/76 | HR 95 | Temp 96.8°F | Ht 63.0 in | Wt 176.6 lb

## 2019-09-07 DIAGNOSIS — R748 Abnormal levels of other serum enzymes: Secondary | ICD-10-CM

## 2019-09-07 NOTE — Progress Notes (Signed)
Primary Care Physician: McLean-Scocuzza, Nino Glow, MD  Primary Gastroenterologist:  Dr. Lucilla Lame  Chief Complaint  Patient presents with  . Follow up liver biopsy results    HPI: Barbara Faulkner is a 54 y.o. female here for follow-up after having a liver biopsy.  The patient was found on liver biopsy to have mild patchy lymphocytic infiltrate which was reported to be nonspecific.  The patient has had some of her medications adjusted recently and the biopsy suggested that this may be a reaction to the medication.  It was less likely to represent an autoimmune disease.  The patient denies any symptoms at the present time and states that she is feeling well.  Current Outpatient Medications  Medication Sig Dispense Refill  . diclofenac sodium (VOLTAREN) 1 % GEL Apply topically 4 (four) times daily.    . fluticasone (FLONASE) 50 MCG/ACT nasal spray Place 2 sprays into both nostrils daily. Prn 16 g 6  . iron dextran complex in sodium chloride 0.9 % 500 mL Inject 2,000 mg into the vein every 30 (thirty) days.    [name] LORazepam (ATIVAN) 0.5 MG tablet Take 0.5-1 tablets (0.25-0.5 mg total) by mouth every 8 (eight) hours. 15 tablet 0  . Multiple Vitamin (MULTIVITAMIN) tablet Take 1 tablet by mouth daily.    [name] PARoxetine (PAXIL) 40 MG tablet TAKE 1 TABLET(40 MG) BY MOUTH DAILY 90 tablet 3  . QUEtiapine (SEROQUEL) 100 MG tablet Take 100 mg by mouth at bedtime.    . sertraline (ZOLOFT) 50 MG tablet Take 50 mg by mouth every morning.    [name] tiZANidine (ZANAFLEX) 4 MG capsule Take 1 capsule (4 mg total) by mouth at bedtime as needed for muscle spasms (leg cramps). 30 capsule 2  . traMADol (ULTRAM) 50 MG tablet Take 1 tablet (50 mg total) by mouth 3 (three) times daily as needed. FOR SHOULDER OR ARTHRITIS PAIN 90 tablet 2  . traZODone (DESYREL) 50 MG tablet Take 1-2 tablets (50-100 mg total) by mouth at bedtime as needed for sleep. 60 tablet 2  . gabapentin (NEURONTIN) 300 MG capsule Take 1 capsule (300 mg  total) by mouth at bedtime. 30 capsule 2  . omeprazole (PRILOSEC) 40 MG capsule Take 1 capsule (40 mg total) by mouth 2 (two) times daily. 180 capsule 0   No current facility-administered medications for this visit.    Allergies as of 09/07/2019 - Review Complete 09/07/2019  Allergen Reaction Noted  . Shellfish allergy Swelling 09/27/2010  . Hydrocod polst-cpm polst er Other (See Comments) 09/18/2011    ROS:  General: Negative for anorexia, weight loss, fever, chills, fatigue, weakness. ENT: Negative for hoarseness, difficulty swallowing , nasal congestion. CV: Negative for chest pain, angina, palpitations, dyspnea on exertion, peripheral edema.  Respiratory: Negative for dyspnea at rest, dyspnea on exertion, cough, sputum, wheezing.  GI: See history of present illness. GU:  Negative for dysuria, hematuria, urinary incontinence, urinary frequency, nocturnal urination.  Endo: Negative for unusual weight change.    Physical Examination:   BP 111/76   Pulse 95   Temp (!) 96.8 F (36 C) (Temporal)   Ht 5\' 3"  (1.6 m)   Wt 176 lb 9.6 oz (80.1 kg)   LMP 05/04/2014   BMI 31.28 kg/m   General: Well-nourished, well-developed in no acute distress.  Eyes: No icterus. Conjunctivae pink. Neuro: Alert and oriented x 3.  Grossly intact. Skin: Warm and dry, no jaundice.   Psych: Alert and cooperative, normal mood and affect.  Labs:    Imaging Studies: US BIOPSY (LIVER)  Result Date: 08/24/2019 INDICATION: 54 year old female with elevated LFTs. EXAM: Ultrasound-guided core biopsy of the liver Interventional Radiologist:  Sterling Big, MD MEDICATIONS: None. ANESTHESIA/SEDATION: Fentanyl 100 mcg IV; Versed 2 mg IV Moderate Sedation Time:  15 minutes The patient was continuously monitored during the procedure by the interventional radiology nurse under my direct supervision. FLUOROSCOPY TIME:  None. COMPLICATIONS: None immediate. PROCEDURE: Informed consent was obtained from the  patient following explanation of the procedure, risks, benefits and alternatives. The patient understands, agrees and consents for the procedure. All questions were addressed. A time out was performed. The right upper quadrant was interrogated with ultrasound. A relatively avascular plane of the liver was identified. A suitable skin entry site was selected and marked. The region was then sterilely prepped and draped in standard fashion using chlorhexidine skin prep. Local anesthesia was attained by infiltration with 1% lidocaine. A small dermatotomy was made. Under real-time sonographic guidance, a 17 gauge trocar needle was advanced into the liver. Multiple 18 gauge core biopsies were then coaxially obtained. Needle placement was confirmed on all biopsy passes with real-time sonography. Biopsy specimens were placed in formalin and delivered to pathology for further analysis. As the introducer needle was removed, the biopsy tract was embolized with a Gel-Foam slurry. Post biopsy ultrasound imaging demonstrates no active bleeding or perihepatic hematoma. The patient tolerated the procedure well. IMPRESSION: Technically successful ultrasound-guided random core biopsy of the liver. Electronically Signed   By: Malachy Moan M.D.   On: 08/24/2019 13:55    Assessment and Plan:   Barbara Faulkner is a 54 y.o. y/o female who comes in today after having a liver biopsy for abnormal liver enzymes.  The patient's liver biopsy showed mild patchy infiltrates of lymphocytes without any fibrosis.  The patient has been told the results of the pathology and she will have her blood checked for fractionation of the alkaline phosphatase to see if it is from a liver source or possibly a intestinal versus bone source.  The patient's GGT has been normal in the past which I would expect to be elevated with liver disease.  She will also have her liver enzymes rechecked as well as her calcium and parathyroid hormone.  The patient will  be notified with the results.  She has been explained the plan and agrees with it.     Midge Minium, MD. Clementeen Graham    Note: This dictation was prepared with Dragon dictation along with smaller phrase technology. Any transcriptional errors that result from this process are unintentional.

## 2019-09-09 ENCOUNTER — Other Ambulatory Visit: Payer: Self-pay

## 2019-09-09 ENCOUNTER — Other Ambulatory Visit: Payer: Self-pay | Admitting: Emergency Medicine

## 2019-09-09 DIAGNOSIS — F419 Anxiety disorder, unspecified: Secondary | ICD-10-CM | POA: Diagnosis not present

## 2019-09-09 DIAGNOSIS — F333 Major depressive disorder, recurrent, severe with psychotic symptoms: Secondary | ICD-10-CM | POA: Diagnosis not present

## 2019-09-09 DIAGNOSIS — D509 Iron deficiency anemia, unspecified: Secondary | ICD-10-CM

## 2019-09-09 DIAGNOSIS — F5105 Insomnia due to other mental disorder: Secondary | ICD-10-CM | POA: Diagnosis not present

## 2019-09-10 NOTE — Progress Notes (Deleted)
[facility]  [phone] [phone]  ID: Barbara Faulkner OB: Feb 10, 1966  MR#: [id number]  IRJ#:[id number]  Patient Care Team: McLean-Scocuzza, Pasty Spillers, MD as PCP - General (Internal Medicine)  I connected with Barbara Faulkner on 09/10/19 at  2:45 PM EST by {Blank single:19197::"video enabled telemedicine visit","telephone visit"} and verified that I am speaking with the correct person using two identifiers.   I discussed the limitations, risks, security and privacy concerns of performing an evaluation and management service by telemedicine and the availability of in-person appointments. I also discussed with the patient that there may be a patient responsible charge related to this service. The patient expressed understanding and agreed to proceed.   Other persons participating in the visit and their role in the encounter: Patient, MD.  Patient's location: Home. Provider's location: Clinic.  CHIEF COMPLAINT:  Iron deficiency anemia.  INTERVAL HISTORY: Patient returns to clinic today for repeat laboratory work and further evaluation.  She continues to feel well and remains asymptomatic.  She does not complain of weakness or fatigue.  She has no neurologic complaints.  She denies any recent fevers or illnesses.  She has a good appetite and denies weight loss.  She denies any chest pain, shortness of breath, cough, or hemoptysis.  She denies any nausea, vomiting, constipation, or diarrhea.  She denies any melena or hematochezia.  She has no urinary complaints.  Patient offers no specific complaints today.  REVIEW OF SYSTEMS:   Review of Systems  Constitutional: Negative.  Negative for fever, malaise/fatigue and weight loss.  Respiratory: Negative.  Negative for cough, hemoptysis and shortness of breath.   Cardiovascular: Negative.  Negative for chest pain and leg swelling.  Gastrointestinal: Negative.  Negative for abdominal pain, blood in stool and  melena.  Genitourinary: Negative.  Negative for dysuria and hematuria.  Musculoskeletal: Negative.  Negative for back pain.  Skin: Negative.  Negative for rash.  Neurological: Negative.  Negative for focal weakness, weakness and headaches.  Psychiatric/Behavioral: Negative.  The patient is not nervous/anxious.     As per HPI. Otherwise, a complete review of systems is negative.  PAST MEDICAL HISTORY: Past Medical History:  Diagnosis Date  . Allergy   . Chronic pain   . Chronic sinusitis   . Depression   . Heart murmur   . Hiatal hernia   . HPV (human papilloma virus) anogenital infection   . Migraine   . Psoriatic arthritis (H[facility])     PAST SURGICAL HISTORY: Past Surgical History:  Procedure Laterality Date  . BREAST BIOPSY Right 01/28/2018   US guided biopsy - heart shaped  . CHOLECYSTECTOMY  2009  . COLONOSCOPY WITH PROPOFOL N/A 07/19/2018   Procedure: COLONOSCOPY WITH PROPOFOL;  Surgeon: Toney Reil, MD;  Location: [facility] ENDOSCOPY;  Service: Gastroenterology;  Laterality: N/A;  . ESOPHAGOGASTRODUODENOSCOPY (EGD) WITH PROPOFOL N/A 07/19/2018   Procedure: ESOPHAGOGASTRODUODENOSCOPY (EGD) WITH PROPOFOL;  Surgeon: Toney Reil, MD;  Location: [facility] ENDOSCOPY;  Service: Gastroenterology;  Laterality: N/A;  . GASTRIC BYPASS     2015; duodenal switch   . LAPAROSCOPIC GASTRIC BANDING  2008    removed 2009  . TUBAL LIGATION  1997    FAMILY HISTORY: Family History  Problem Relation Age of Onset  . HIV Father   . Hypertension Mother   . Hyperlipidemia Mother   . Lung cancer Maternal Grandfather        smoker  . Hepatitis Maternal Uncle        drug  use  . Colon cancer Neg Hx   . Colon polyps Neg Hx   . Rectal cancer Neg Hx   . Stomach cancer Neg Hx   . Breast cancer Neg Hx     ADVANCED DIRECTIVES (Y/N):  N  HEALTH MAINTENANCE: Social History   Tobacco Use  . Smoking status: Former Smoker    Packs/day: 2.00    Years: 20.00    Pack years: 40.00     Types: Cigarettes    Quit date: 09/02/2003    Years since quitting: 16.0  . Smokeless tobacco: Never Used  Substance Use Topics  . Alcohol use: No    Alcohol/week: 0.0 standard drinks  . Drug use: No     Colonoscopy:  PAP:  Bone density:  Lipid panel:  Allergies  Allergen Reactions  . Shellfish Allergy Swelling    REACTION: throat closing  . Hydrocod Polst-Cpm Polst Er Other (See Comments)    Bad dreams    Current Outpatient Medications  Medication Sig Dispense Refill  . diclofenac sodium (VOLTAREN) 1 % GEL Apply topically 4 (four) times daily.    . fluticasone (FLONASE) 50 MCG/ACT nasal spray Place 2 sprays into both nostrils daily. Prn 16 g 6  . gabapentin (NEURONTIN) 300 MG capsule Take 1 capsule (300 mg total) by mouth at bedtime. 30 capsule 2  . iron dextran complex in sodium chloride 0.9 % 500 mL Inject 2,000 mg into the vein every 30 (thirty) days.    [name] LORazepam (ATIVAN) 0.5 MG tablet Take 0.5-1 tablets (0.25-0.5 mg total) by mouth every 8 (eight) hours. 15 tablet 0  . Multiple Vitamin (MULTIVITAMIN) tablet Take 1 tablet by mouth daily.    [name] omeprazole (PRILOSEC) 40 MG capsule Take 1 capsule (40 mg total) by mouth 2 (two) times daily. 180 capsule 0  . PARoxetine (PAXIL) 40 MG tablet TAKE 1 TABLET(40 MG) BY MOUTH DAILY 90 tablet 3  . QUEtiapine (SEROQUEL) 100 MG tablet Take 100 mg by mouth at bedtime.    . sertraline (ZOLOFT) 50 MG tablet Take 50 mg by mouth every morning.    [name] tiZANidine (ZANAFLEX) 4 MG capsule Take 1 capsule (4 mg total) by mouth at bedtime as needed for muscle spasms (leg cramps). 30 capsule 2  . traMADol (ULTRAM) 50 MG tablet Take 1 tablet (50 mg total) by mouth 3 (three) times daily as needed. FOR SHOULDER OR ARTHRITIS PAIN 90 tablet 2  . traZODone (DESYREL) 50 MG tablet Take 1-2 tablets (50-100 mg total) by mouth at bedtime as needed for sleep. 60 tablet 2   No current facility-administered medications for this visit.    OBJECTIVE: There were no  vitals filed for this visit.   There is no height or weight on file to calculate BMI.    ECOG FS:0 - Asymptomatic  General: Well-developed, well-nourished, no acute distress. Eyes: Pink conjunctiva, anicteric sclera. HEENT: Normocephalic, moist mucous membranes. Lungs: Clear to auscultation bilaterally. Heart: Regular rate and rhythm. No rubs, murmurs, or gallops. Abdomen: Soft, nontender, nondistended. No organomegaly noted, normoactive bowel sounds. Musculoskeletal: No edema, cyanosis, or clubbing. Neuro: Alert, answering all questions appropriately. Cranial nerves grossly intact. Skin: No rashes or petechiae noted. Psych: Normal affect.   LAB RESULTS:  Lab Results  Component Value Date   NA 144 04/18/2019   K 3.9 04/18/2019   CL 112 04/18/2019   CO2 23 04/18/2019   GLUCOSE 90 04/18/2019   BUN 20 04/18/2019   CREATININE 1.16 04/18/2019   CALCIUM 8.5  04/18/2019   PROT 5.9 (L) 08/10/2019   ALBUMIN 4.3 08/10/2019   AST 54 (H) 08/10/2019   ALT 59 (H) 08/10/2019   ALKPHOS 173 (H) 08/10/2019   BILITOT 0.8 08/10/2019   GFRNONAA >60 02/15/2014   GFRAA >60 02/15/2014    Lab Results  Component Value Date   WBC 8.5 08/24/2019   NEUTROABS 6.0 04/18/2019   HGB 11.8 (L) 08/24/2019   HCT 35.9 (L) 08/24/2019   MCV 82.5 08/24/2019   PLT 174 08/24/2019   Lab Results  Component Value Date   IRON 63 04/05/2019   TIBC 294 04/05/2019   IRONPCTSAT 21 04/05/2019   Lab Results  Component Value Date   FERRITIN 83 04/05/2019     STUDIES: US BIOPSY (LIVER)  Result Date: 08/24/2019 INDICATION: 54 year old female with elevated LFTs. EXAM: Ultrasound-guided core biopsy of the liver Interventional Radiologist:  Criselda Peaches, MD MEDICATIONS: None. ANESTHESIA/SEDATION: Fentanyl 100 mcg IV; Versed 2 mg IV Moderate Sedation Time:  15 minutes The patient was continuously monitored during the procedure by the interventional radiology nurse under my direct supervision. FLUOROSCOPY  TIME:  None. COMPLICATIONS: None immediate. PROCEDURE: Informed consent was obtained from the patient following explanation of the procedure, risks, benefits and alternatives. The patient understands, agrees and consents for the procedure. All questions were addressed. A time out was performed. The right upper quadrant was interrogated with ultrasound. A relatively avascular plane of the liver was identified. A suitable skin entry site was selected and marked. The region was then sterilely prepped and draped in standard fashion using chlorhexidine skin prep. Local anesthesia was attained by infiltration with 1% lidocaine. A small dermatotomy was made. Under real-time sonographic guidance, a 17 gauge trocar needle was advanced into the liver. Multiple 18 gauge core biopsies were then coaxially obtained. Needle placement was confirmed on all biopsy passes with real-time sonography. Biopsy specimens were placed in formalin and delivered to pathology for further analysis. As the introducer needle was removed, the biopsy tract was embolized with a Gel-Foam slurry. Post biopsy ultrasound imaging demonstrates no active bleeding or perihepatic hematoma. The patient tolerated the procedure well. IMPRESSION: Technically successful ultrasound-guided random core biopsy of the liver. Electronically Signed   By: Jacqulynn Cadet M.D.   On: 08/24/2019 13:55    ASSESSMENT: Iron deficiency anemia  PLAN:    1.  Iron deficiency anemia: Likely secondary to poor absorption from history of gastric bypass.  Patient had colonoscopy and endoscopy on July 19, 2018 that did not reveal definitive source of bleeding.  Patient also reports she cannot tolerate oral iron supplementation.  Patient's hemoglobin has improved and is now 11.2.  Her iron stores are now within normal limits.  She does not require additional IV Feraheme today.  Return to clinic in 4 months with repeat laboratory work, further evaluation, and continuation of  treatment if necessary.   I provided *** minutes of {Blank single:19197::"face-to-face video visit time","non face-to-face telephone visit time"} during this encounter which included chart review. Greater than 50% was spent counseling as documented under my assessment & plan.   Patient expressed understanding and was in agreement with this plan. She also understands that She can call clinic at any time with any questions, concerns, or complaints.     Lloyd Huger, MD   09/10/2019 8:17 AM

## 2019-09-12 ENCOUNTER — Inpatient Hospital Stay: Payer: Federal, State, Local not specified - PPO

## 2019-09-13 ENCOUNTER — Telehealth: Payer: Self-pay | Admitting: Oncology

## 2019-09-13 ENCOUNTER — Other Ambulatory Visit: Payer: Self-pay

## 2019-09-13 ENCOUNTER — Inpatient Hospital Stay: Payer: Federal, State, Local not specified - PPO | Admitting: Oncology

## 2019-09-13 NOTE — Telephone Encounter (Signed)
Pt was supposed to have labs drawn prior to Mychart visit on 1/12. Left pt VM to contact office to r\s appt. BF

## 2019-09-14 ENCOUNTER — Inpatient Hospital Stay: Payer: Federal, State, Local not specified - PPO

## 2019-09-15 DIAGNOSIS — F411 Generalized anxiety disorder: Secondary | ICD-10-CM | POA: Diagnosis not present

## 2019-09-28 DIAGNOSIS — B349 Viral infection, unspecified: Secondary | ICD-10-CM | POA: Diagnosis not present

## 2019-10-05 DIAGNOSIS — M25552 Pain in left hip: Secondary | ICD-10-CM | POA: Diagnosis not present

## 2019-10-05 DIAGNOSIS — M1612 Unilateral primary osteoarthritis, left hip: Secondary | ICD-10-CM | POA: Diagnosis not present

## 2019-10-06 ENCOUNTER — Other Ambulatory Visit: Payer: Self-pay | Admitting: Student in an Organized Health Care Education/Training Program

## 2019-10-11 ENCOUNTER — Other Ambulatory Visit: Payer: Self-pay | Admitting: Student in an Organized Health Care Education/Training Program

## 2019-10-17 DIAGNOSIS — F5105 Insomnia due to other mental disorder: Secondary | ICD-10-CM | POA: Diagnosis not present

## 2019-10-17 DIAGNOSIS — F419 Anxiety disorder, unspecified: Secondary | ICD-10-CM | POA: Diagnosis not present

## 2019-10-17 DIAGNOSIS — F333 Major depressive disorder, recurrent, severe with psychotic symptoms: Secondary | ICD-10-CM | POA: Diagnosis not present

## 2019-10-21 ENCOUNTER — Other Ambulatory Visit: Payer: Self-pay | Admitting: Student in an Organized Health Care Education/Training Program

## 2019-10-22 ENCOUNTER — Encounter: Payer: Self-pay | Admitting: Student in an Organized Health Care Education/Training Program

## 2019-11-02 ENCOUNTER — Encounter: Payer: Self-pay | Admitting: Student in an Organized Health Care Education/Training Program

## 2019-11-02 ENCOUNTER — Telehealth: Payer: Self-pay | Admitting: *Deleted

## 2019-11-02 NOTE — Telephone Encounter (Signed)
Attempted to call for pre appointment review of allergies/meds. Patient unavailable, asked that I call her again later.

## 2019-11-03 ENCOUNTER — Ambulatory Visit
Payer: Federal, State, Local not specified - PPO | Attending: Student in an Organized Health Care Education/Training Program | Admitting: Student in an Organized Health Care Education/Training Program

## 2019-11-03 ENCOUNTER — Other Ambulatory Visit: Payer: Self-pay

## 2019-11-03 DIAGNOSIS — G894 Chronic pain syndrome: Secondary | ICD-10-CM

## 2019-11-03 NOTE — Progress Notes (Signed)
I attempted to call the patient however no response. Voicemail left instructing patient to call front desk office at [phone] to reschedule appointment. -Dr Aquan Kope  

## 2019-11-09 ENCOUNTER — Encounter: Payer: Self-pay | Admitting: Student in an Organized Health Care Education/Training Program

## 2019-11-10 ENCOUNTER — Encounter: Payer: Self-pay | Admitting: Student in an Organized Health Care Education/Training Program

## 2019-11-10 ENCOUNTER — Other Ambulatory Visit: Payer: Self-pay

## 2019-11-10 ENCOUNTER — Ambulatory Visit
Payer: Federal, State, Local not specified - PPO | Attending: Student in an Organized Health Care Education/Training Program | Admitting: Student in an Organized Health Care Education/Training Program

## 2019-11-10 DIAGNOSIS — M25512 Pain in left shoulder: Secondary | ICD-10-CM

## 2019-11-10 DIAGNOSIS — M25552 Pain in left hip: Secondary | ICD-10-CM

## 2019-11-10 DIAGNOSIS — A6920 Lyme disease, unspecified: Secondary | ICD-10-CM

## 2019-11-10 DIAGNOSIS — M19011 Primary osteoarthritis, right shoulder: Secondary | ICD-10-CM

## 2019-11-10 DIAGNOSIS — M25561 Pain in right knee: Secondary | ICD-10-CM

## 2019-11-10 DIAGNOSIS — M25551 Pain in right hip: Secondary | ICD-10-CM

## 2019-11-10 DIAGNOSIS — M25511 Pain in right shoulder: Secondary | ICD-10-CM | POA: Diagnosis not present

## 2019-11-10 DIAGNOSIS — G8929 Other chronic pain: Secondary | ICD-10-CM

## 2019-11-10 DIAGNOSIS — M2550 Pain in unspecified joint: Secondary | ICD-10-CM

## 2019-11-10 DIAGNOSIS — G894 Chronic pain syndrome: Secondary | ICD-10-CM

## 2019-11-10 DIAGNOSIS — L4050 Arthropathic psoriasis, unspecified: Secondary | ICD-10-CM | POA: Diagnosis not present

## 2019-11-10 DIAGNOSIS — M25562 Pain in left knee: Secondary | ICD-10-CM

## 2019-11-10 MED ORDER — GABAPENTIN 400 MG PO CAPS: 400.0000 mg | ORAL_CAPSULE | Freq: Every day | ORAL | 2 refills | Status: DC

## 2019-11-10 NOTE — Progress Notes (Signed)
Patient: Barbara Faulkner  Service Category: E/M  Provider: Gillis Santa, MD  DOB: October 15, 1965  DOS: 11/10/2019  Location: Office  MRN: [id number]  Setting: Ambulatory outpatient  Referring Provider: McLean-Scocuzza, Olivia Mackie *  Type: Established Patient  Specialty: Interventional Pain Management  PCP: McLean-Scocuzza, Nino Glow, MD  Location: Home  Delivery: TeleHealth     Virtual Encounter - Pain Management PROVIDER NOTE: Information contained herein reflects review and annotations entered in association with encounter. Interpretation of such information and data should be left to medically-trained personnel. Information provided to patient can be located elsewhere in the medical record under "Patient Instructions". Document created using STT-dictation technology, any transcriptional errors that may result from process are unintentional.    Contact & Pharmacy Preferred: [phone] Home: [phone] (home) Mobile: [phone] (mobile) E-mail: [id number] [address], [address] [place] [address] Phone: [phone] Fax: [phone]   Pre-screening  Barbara Faulkner offered "in-person" vs "virtual" encounter. She indicated preferring virtual for this encounter.   Reason COVID-19*  Social distancing based on CDC and AMA recommendations.   I contacted JETTIE MANNOR on 11/10/2019 via telephone.      I clearly identified myself as Gillis Santa, MD. I verified that I was speaking with the correct person using two identifiers (Name: Barbara Faulkner, and date of birth: [birth year]).  Consent I sought verbal advanced consent from Barbara Faulkner for virtual visit interactions. I informed Barbara Faulkner of possible security and privacy concerns, risks, and limitations associated with providing "not-in-person" medical evaluation and management services. I also informed Barbara Faulkner of the availability of  "in-person" appointments. Finally, I informed her that there would be a charge for the virtual visit and that she could be  personally, fully or partially, financially responsible for it. Barbara Faulkner expressed understanding and agreed to proceed.   Historic Elements   Barbara Faulkner is a 54 y.o. year old, female patient evaluated today after her last contact with our practice on 11/03/2019. Barbara Faulkner  has a past medical history of Allergy, Chronic pain, Chronic sinusitis, Depression, Heart murmur, Hiatal hernia, HPV (human papilloma virus) anogenital infection, Migraine, and Psoriatic arthritis ([place]). She also  has a past surgical history that includes Cholecystectomy (2009); Tubal ligation (1997); Laparoscopic gastric banding (2008 ); Gastric bypass; Breast biopsy (Right, 01/28/2018); Colonoscopy with propofol (N/A, 07/19/2018); and Esophagogastroduodenoscopy (egd) with propofol (N/A, 07/19/2018). Barbara Faulkner has a current medication list which includes the following prescription(s): diclofenac sodium, fluticasone, iron dextran complex in sodium chloride 0.9 % 500 mL, multivitamin, omeprazole, quetiapine, sertraline, sertraline, tizanidine, tramadol, and gabapentin. She  reports that she quit smoking about 16 years ago. Her smoking use included cigarettes. She has a 40.00 pack-year smoking history. She has never used smokeless tobacco. She reports that she does not drink alcohol or use drugs. Barbara Faulkner is allergic to shellfish allergy and hydrocod polst-cpm polst er.   HPI  Today, she is being contacted for medication management.   Overall patient is doing well.  She did have increased pain in her left hip for which she received a left hip intra-articular steroid injection under ultrasound guidance with Barbara Faulkner orthopedics.  She states that this was helpful.  From a psych standpoint, her psychiatrist transitioned her from Paxil to [place].  Current Zoloft dose is 50 mg.  She does utilize tizanidine on  an as-needed basis when she has muscle  cramps..  She finds benefit with gabapentin for her neuropathic pain and restless leg.  We discussed increasing dose to 400 mg nightly.  She does not need a refill on tramadol.  Pharmacotherapy Assessment  Analgesic: Tramadol 50 mg TID prn (2 fills since 09/08/2018)   Monitoring:  PMP: PDMP reviewed during this encounter.       Pharmacotherapy: No side-effects or adverse reactions reported. Compliance: No problems identified. Effectiveness: Clinically acceptable. Plan: Refer to "POC".  UDS:  Summary  Date Value Ref Range Status  08/19/2018 FINAL  Final    Comment:    ==================================================================== TOXASSURE COMP DRUG ANALYSIS,UR ==================================================================== Test                             Result       Flag       Units Drug Present and Declared for Prescription Verification   Tramadol                       31           EXPECTED   ng/mg creat   N-Desmethyltramadol            66           EXPECTED   ng/mg creat    Source of tramadol is a prescription medication.    N-desmethyltramadol is an expected metabolite of tramadol.   Paroxetine                     PRESENT      EXPECTED Drug Absent but Declared for Prescription Verification   Tizanidine                     Not Detected UNEXPECTED    Tizanidine, as indicated in the declared medication list, is not    always detected even when used as directed.   Diclofenac                     Not Detected UNEXPECTED    Diclofenac, as indicated in the declared medication list, is not    always detected even when used as directed. ==================================================================== Test                      Result    Flag   Units      Ref Range   Creatinine              180              mg/dL      >=20 ==================================================================== Declared Medications:  The flagging and  interpretation on this report are based on the  following declared medications.  Unexpected results may arise from  inaccuracies in the declared medications.  **Note: The testing scope of this panel includes these medications:  Paroxetine  Tramadol  **Note: The testing scope of this panel does not include small to  moderate amounts of these reported medications:  Diclofenac  Tizanidine  **Note: The testing scope of this panel does not include following  reported medications:  Cholecalciferol  Cholestyramine  Multivitamin  Omeprazole ==================================================================== For clinical consultation, please call [phone]. ====================================================================    Laboratory Chemistry Profile   Renal Lab Results  Component Value Date   BUN 20 04/18/2019   CREATININE 1.16 98/92/1194   BCR NOT APPLICABLE 17/40/8144   GFR 59.63 (L) 04/18/2019  GFRAA >60 02/15/2014   GFRNONAA >60 02/15/2014    Hepatic Lab Results  Component Value Date   AST 54 (H) 08/10/2019   ALT 59 (H) 08/10/2019   ALBUMIN 4.3 08/10/2019   ALKPHOS 173 (H) 08/10/2019   AMYLASE 33 09/25/2014   LIPASE 124 10/04/2013    Electrolytes Lab Results  Component Value Date   NA 144 04/18/2019   K 3.9 04/18/2019   CL 112 04/18/2019   CALCIUM 8.5 04/18/2019   MG 1.9 07/14/2018   PHOS 5.0 (H) 09/25/2014    Bone Lab Results  Component Value Date   VD25OH 41.62 03/02/2019    Inflammation (CRP: Acute Phase) (ESR: Chronic Phase) Lab Results  Component Value Date   CRP <1.0 03/10/2019   ESRSEDRATE 3 03/10/2019      Note: Above Lab results reviewed.  Imaging  US BIOPSY (LIVER) INDICATION: 54 year old female with elevated LFTs.  EXAM: Ultrasound-guided core biopsy of the liver  Interventional Radiologist:  Criselda Peaches, MD  MEDICATIONS: None.  ANESTHESIA/SEDATION: Fentanyl 100 mcg IV; Versed 2 mg IV  Moderate Sedation Time:   15 minutes  The patient was continuously monitored during the procedure by the interventional radiology nurse under my direct supervision.  FLUOROSCOPY TIME:  None.  COMPLICATIONS: None immediate.  PROCEDURE: Informed consent was obtained from the patient following explanation of the procedure, risks, benefits and alternatives. The patient understands, agrees and consents for the procedure. All questions were addressed. A time out was performed.  The right upper quadrant was interrogated with ultrasound. A relatively avascular plane of the liver was identified. A suitable skin entry site was selected and marked. The region was then sterilely prepped and draped in standard fashion using chlorhexidine skin prep. Local anesthesia was attained by infiltration with 1% lidocaine. A small dermatotomy was made.  Under real-time sonographic guidance, a 17 gauge trocar needle was advanced into the liver. Multiple 18 gauge core biopsies were then coaxially obtained. Needle placement was confirmed on all biopsy passes with real-time sonography. Biopsy specimens were placed in formalin and delivered to pathology for further analysis.  As the introducer needle was removed, the biopsy tract was embolized with a Gel-Foam slurry. Post biopsy ultrasound imaging demonstrates no active bleeding or perihepatic hematoma. The patient tolerated the procedure well.  IMPRESSION: Technically successful ultrasound-guided random core biopsy of the liver.  Electronically Signed   By: Jacqulynn Cadet M.D.   On: 08/24/2019 13:55  Assessment  The primary encounter diagnosis was Chronic pain syndrome. 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  Plan of Care  Ms. VALETA PAZ has a current medication list  which includes the following long-term medication(s): fluticasone, omeprazole, quetiapine, sertraline, sertraline, and gabapentin.  Pharmacotherapy (Medications Ordered): Meds ordered this encounter  Medications  . gabapentin (NEURONTIN) 400 MG capsule    Sig: Take 1 capsule (400 mg total) by mouth at bedtime.    Dispense:  30 capsule    Refill:  2   Follow-up plan:   Return in about 3 months (around 02/10/2020) for Medication Management.    Recent Visits No visits were found meeting these conditions.  Showing recent visits within past 90 days and meeting all other requirements   Today's Visits Date Type Provider Dept  11/10/19 Office Visit Gillis Santa, MD [facility]  Showing today's visits  and meeting all other requirements   Future Appointments No visits were found meeting these conditions.  Showing future appointments within next 90 days and meeting all other requirements   I discussed the assessment and treatment plan with the patient. The patient was provided an opportunity to ask questions and all were answered. The patient agreed with the plan and demonstrated an understanding of the instructions.  Patient advised to call back or seek an in-person evaluation if the symptoms or condition worsens.  Duration of encounter: 15 minutes.  Note by: Gillis Santa, MD Date: 11/10/2019; Time: 10:01 AM

## 2019-11-23 ENCOUNTER — Encounter: Payer: Self-pay | Admitting: Student in an Organized Health Care Education/Training Program

## 2019-11-24 ENCOUNTER — Encounter: Payer: Self-pay | Admitting: Internal Medicine

## 2019-11-24 ENCOUNTER — Encounter: Payer: Self-pay | Admitting: *Deleted

## 2019-11-24 DIAGNOSIS — M25512 Pain in left shoulder: Secondary | ICD-10-CM

## 2019-11-24 DIAGNOSIS — G894 Chronic pain syndrome: Secondary | ICD-10-CM

## 2019-11-24 DIAGNOSIS — G8929 Other chronic pain: Secondary | ICD-10-CM

## 2019-11-24 DIAGNOSIS — L4050 Arthropathic psoriasis, unspecified: Secondary | ICD-10-CM

## 2019-11-24 MED ORDER — TIZANIDINE HCL 4 MG PO CAPS: 4.0000 mg | ORAL_CAPSULE | Freq: Every evening | ORAL | 2 refills | Status: DC | PRN

## 2019-11-24 MED ORDER — TRAMADOL HCL 50 MG PO TABS: 50.0000 mg | ORAL_TABLET | Freq: Three times a day (TID) | ORAL | 2 refills | Status: DC | PRN

## 2019-12-09 DIAGNOSIS — F419 Anxiety disorder, unspecified: Secondary | ICD-10-CM | POA: Diagnosis not present

## 2019-12-09 DIAGNOSIS — F4312 Post-traumatic stress disorder, chronic: Secondary | ICD-10-CM | POA: Diagnosis not present

## 2019-12-09 DIAGNOSIS — F333 Major depressive disorder, recurrent, severe with psychotic symptoms: Secondary | ICD-10-CM | POA: Diagnosis not present

## 2019-12-09 DIAGNOSIS — F5105 Insomnia due to other mental disorder: Secondary | ICD-10-CM | POA: Diagnosis not present

## 2019-12-22 DIAGNOSIS — F5105 Insomnia due to other mental disorder: Secondary | ICD-10-CM | POA: Diagnosis not present

## 2019-12-22 DIAGNOSIS — F4312 Post-traumatic stress disorder, chronic: Secondary | ICD-10-CM | POA: Diagnosis not present

## 2019-12-22 DIAGNOSIS — F333 Major depressive disorder, recurrent, severe with psychotic symptoms: Secondary | ICD-10-CM | POA: Diagnosis not present

## 2019-12-22 DIAGNOSIS — F419 Anxiety disorder, unspecified: Secondary | ICD-10-CM | POA: Diagnosis not present

## 2020-01-01 DIAGNOSIS — F333 Major depressive disorder, recurrent, severe with psychotic symptoms: Secondary | ICD-10-CM | POA: Diagnosis not present

## 2020-01-01 DIAGNOSIS — F419 Anxiety disorder, unspecified: Secondary | ICD-10-CM | POA: Diagnosis not present

## 2020-01-01 DIAGNOSIS — F4312 Post-traumatic stress disorder, chronic: Secondary | ICD-10-CM | POA: Diagnosis not present

## 2020-01-01 DIAGNOSIS — F5105 Insomnia due to other mental disorder: Secondary | ICD-10-CM | POA: Diagnosis not present

## 2020-01-08 ENCOUNTER — Encounter: Payer: Self-pay | Admitting: Internal Medicine

## 2020-01-09 ENCOUNTER — Encounter: Payer: Self-pay | Admitting: Internal Medicine

## 2020-01-16 DIAGNOSIS — F4310 Post-traumatic stress disorder, unspecified: Secondary | ICD-10-CM | POA: Diagnosis not present

## 2020-01-24 DIAGNOSIS — F4310 Post-traumatic stress disorder, unspecified: Secondary | ICD-10-CM | POA: Diagnosis not present

## 2020-01-31 ENCOUNTER — Encounter: Payer: Self-pay | Admitting: Internal Medicine

## 2020-01-31 DIAGNOSIS — N62 Hypertrophy of breast: Secondary | ICD-10-CM | POA: Diagnosis not present

## 2020-02-06 ENCOUNTER — Encounter: Payer: Self-pay | Admitting: Internal Medicine

## 2020-02-07 ENCOUNTER — Other Ambulatory Visit: Payer: Self-pay | Admitting: Student in an Organized Health Care Education/Training Program

## 2020-02-07 DIAGNOSIS — F4310 Post-traumatic stress disorder, unspecified: Secondary | ICD-10-CM | POA: Diagnosis not present

## 2020-02-07 DIAGNOSIS — G894 Chronic pain syndrome: Secondary | ICD-10-CM

## 2020-02-08 DIAGNOSIS — G894 Chronic pain syndrome: Secondary | ICD-10-CM | POA: Diagnosis not present

## 2020-02-09 ENCOUNTER — Other Ambulatory Visit: Payer: Self-pay

## 2020-02-09 ENCOUNTER — Ambulatory Visit
Payer: Federal, State, Local not specified - PPO | Attending: Student in an Organized Health Care Education/Training Program | Admitting: Student in an Organized Health Care Education/Training Program

## 2020-02-09 ENCOUNTER — Encounter: Payer: Self-pay | Admitting: Student in an Organized Health Care Education/Training Program

## 2020-02-09 VITALS — BP 147/84 | HR 83 | Temp 97.3°F | Resp 18 | Ht 63.0 in | Wt 167.0 lb

## 2020-02-09 DIAGNOSIS — A6920 Lyme disease, unspecified: Secondary | ICD-10-CM | POA: Diagnosis not present

## 2020-02-09 DIAGNOSIS — M25512 Pain in left shoulder: Secondary | ICD-10-CM | POA: Diagnosis not present

## 2020-02-09 DIAGNOSIS — M25552 Pain in left hip: Secondary | ICD-10-CM

## 2020-02-09 DIAGNOSIS — M25561 Pain in right knee: Secondary | ICD-10-CM

## 2020-02-09 DIAGNOSIS — G8929 Other chronic pain: Secondary | ICD-10-CM

## 2020-02-09 DIAGNOSIS — F329 Major depressive disorder, single episode, unspecified: Secondary | ICD-10-CM | POA: Diagnosis not present

## 2020-02-09 DIAGNOSIS — M797 Fibromyalgia: Secondary | ICD-10-CM

## 2020-02-09 DIAGNOSIS — M19011 Primary osteoarthritis, right shoulder: Secondary | ICD-10-CM | POA: Diagnosis not present

## 2020-02-09 DIAGNOSIS — L4050 Arthropathic psoriasis, unspecified: Secondary | ICD-10-CM

## 2020-02-09 DIAGNOSIS — M25511 Pain in right shoulder: Secondary | ICD-10-CM | POA: Diagnosis not present

## 2020-02-09 DIAGNOSIS — M25562 Pain in left knee: Secondary | ICD-10-CM

## 2020-02-09 DIAGNOSIS — M25551 Pain in right hip: Secondary | ICD-10-CM

## 2020-02-09 DIAGNOSIS — F32A Depression, unspecified: Secondary | ICD-10-CM

## 2020-02-09 DIAGNOSIS — M2550 Pain in unspecified joint: Secondary | ICD-10-CM

## 2020-02-09 DIAGNOSIS — F419 Anxiety disorder, unspecified: Secondary | ICD-10-CM

## 2020-02-09 DIAGNOSIS — G894 Chronic pain syndrome: Secondary | ICD-10-CM | POA: Diagnosis not present

## 2020-02-09 MED ORDER — TRAMADOL HCL 50 MG PO TABS: 50.0000 mg | ORAL_TABLET | Freq: Three times a day (TID) | ORAL | 2 refills | Status: AC | PRN

## 2020-02-09 MED ORDER — TIZANIDINE HCL 4 MG PO CAPS: 4.0000 mg | ORAL_CAPSULE | Freq: Every evening | ORAL | 2 refills | Status: DC | PRN

## 2020-02-09 MED ORDER — GABAPENTIN 400 MG PO CAPS: 400.0000 mg | ORAL_CAPSULE | Freq: Every day | ORAL | 2 refills | Status: DC

## 2020-02-09 MED ORDER — MAGNESIUM 500 MG PO CAPS: 500.0000 mg | ORAL_CAPSULE | Freq: Two times a day (BID) | ORAL | 5 refills | Status: AC

## 2020-02-09 NOTE — Patient Instructions (Addendum)
Prescriptions for Tramadol, Gabapentin, Tizanidine, and Magnesium have been sent to your pharmacy.

## 2020-02-09 NOTE — Progress Notes (Signed)
Nursing Pain Medication Assessment:  Safety precautions to be maintained throughout the outpatient stay will include: orient to surroundings, keep bed in low position, maintain call bell within reach at all times, provide assistance with transfer out of bed and ambulation.  Medication Inspection Compliance: Barbara Faulkner did not comply with our request to bring her pills to be counted. She was reminded that bringing the medication bottles, even when empty, is a requirement.  Medication: None brought in. Pill/Patch Count: None available to be counted. Bottle Appearance: No container available. Did not bring bottle(s) to appointment. Filled Date: N/A Last Medication intake:  Today

## 2020-02-09 NOTE — Progress Notes (Signed)
PROVIDER NOTE: Information contained herein reflects review and annotations entered in association with encounter. Interpretation of such information and data should be left to medically-trained personnel. Information provided to patient can be located elsewhere in the medical record under "Patient Instructions". Document created using STT-dictation technology, any transcriptional errors that may result from process are unintentional.    Patient: Barbara Faulkner  Service Category: E/M  Provider: Gillis Santa, MD  DOB: Jun 29, 1966  DOS: 02/09/2020  Specialty: Interventional Pain Management  MRN: [id number]  Setting: Ambulatory outpatient  PCP: McLean-Scocuzza, Nino Glow, MD  Type: Established Patient    Referring Provider: Orland Mustard *  Location: Office  Delivery: Face-to-face     HPI  Reason for encounter: Ms. BELINA MANDILE, a 54 y.o. year old female, is here today for evaluation and management of her Psoriatic arthritis ([place]) [L40.50]. Ms. Vanorder primary complain today is Other (bilateral hand pain), Hip Pain (bilateral), and Other (bilateral ankles) Last encounter: Practice (02/07/2020). My last encounter with her was on 02/07/2020. Pertinent problems: Ms. 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 Pain Assessment: Severity of Chronic pain is reported as a 9 /10. Location: Hand Right, Left/ . Onset: More than a month ago. Quality: Aching, Sharp. Timing: Constant. Modifying factor(s): medication. Vitals:  height is _0  (1.6 m) and weight is 167 lb (75.8 kg). Her temporal temperature is 97.3 F (36.3 C) (abnormal). Her blood pressure is 147/84 (abnormal) and her pulse is 83. Her respiration is 18 and oxygen saturation is 99%.   No change in medical history since last visit.  Patient's pain is at baseline.  Patient continues  multimodal pain regimen as prescribed.  States that it provides pain relief and improvement in functional status.  ROS  Constitutional: Denies any fever or chills Gastrointestinal: No reported hemesis, hematochezia, vomiting, or acute GI distress Musculoskeletal: Denies any acute onset joint swelling, redness, loss of ROM, or weakness Neurological: No reported episodes of acute onset apraxia, aphasia, dysarthria, agnosia, amnesia, paralysis, loss of coordination, or loss of consciousness  Medication Review  Magnesium, QUEtiapine, diclofenac sodium, fluticasone, gabapentin, iron dextran complex in sodium chloride 0.9 % 500 mL, multivitamin, omeprazole, sertraline, tiZANidine, and traMADol  History Review  Allergy: Ms. Halley is allergic to shellfish allergy and hydrocod polst-cpm polst er. Drug: Ms. Bedgood  reports no history of drug use. Alcohol:  reports no history of alcohol use. Tobacco:  reports that she quit smoking about 16 years ago. Her smoking use included cigarettes. She has a 40.00 pack-year smoking history. She has never used smokeless tobacco. Social: Ms. Kilburg  reports that she quit smoking about 16 years ago. Her smoking use included cigarettes. She has a 40.00 pack-year smoking history. She has never used smokeless tobacco. She reports that she does not drink alcohol and does not use drugs. Medical:  has a past medical history of Allergy, Chronic pain, Chronic sinusitis, Depression, Heart murmur, Hiatal hernia, HPV (human papilloma virus) anogenital infection, Migraine, and Psoriatic arthritis ([place]). Surgical: Ms. Saur  has a past surgical history that includes Cholecystectomy (2009); Tubal ligation (1997); Laparoscopic gastric banding (2008 ); Gastric bypass; Breast biopsy (Right, 01/28/2018); Colonoscopy with propofol (N/A, 07/19/2018); and Esophagogastroduodenoscopy (egd) with propofol (N/A, 07/19/2018). Family: family history includes HIV in her father; Hepatitis in her  maternal uncle; Hyperlipidemia in her mother; Hypertension in her mother; Lung  cancer in her maternal grandfather.  Laboratory Chemistry Profile   Renal Lab Results  Component Value Date   BUN 20 04/18/2019   CREATININE 1.16 27/10/5007   BCR NOT APPLICABLE 38/18/2993   GFR 59.63 (L) 04/18/2019   GFRAA >60 02/15/2014   GFRNONAA >60 02/15/2014     Hepatic Lab Results  Component Value Date   AST 54 (H) 08/10/2019   ALT 59 (H) 08/10/2019   ALBUMIN 4.3 08/10/2019   ALKPHOS 173 (H) 08/10/2019   AMYLASE 33 09/25/2014   LIPASE 124 10/04/2013     Electrolytes Lab Results  Component Value Date   NA 144 04/18/2019   K 3.9 04/18/2019   CL 112 04/18/2019   CALCIUM 8.5 04/18/2019   MG 1.9 07/14/2018   PHOS 5.0 (H) 09/25/2014     Bone Lab Results  Component Value Date   VD25OH 41.62 03/02/2019     Inflammation (CRP: Acute Phase) (ESR: Chronic Phase) Lab Results  Component Value Date   CRP <1.0 03/10/2019   ESRSEDRATE 3 03/10/2019       Note: Above Lab results reviewed.  Recent Imaging Review  US BIOPSY (LIVER) INDICATION: 54 year old female with elevated LFTs.  EXAM: Ultrasound-guided core biopsy of the liver  Interventional Radiologist:  Criselda Peaches, MD  MEDICATIONS: None.  ANESTHESIA/SEDATION: Fentanyl 100 mcg IV; Versed 2 mg IV  Moderate Sedation Time:  15 minutes  The patient was continuously monitored during the procedure by the interventional radiology nurse under my direct supervision.  FLUOROSCOPY TIME:  None.  COMPLICATIONS: None immediate.  PROCEDURE: Informed consent was obtained from the patient following explanation of the procedure, risks, benefits and alternatives. The patient understands, agrees and consents for the procedure. All questions were addressed. A time out was performed.  The right upper quadrant was interrogated with ultrasound. A relatively avascular plane of the liver was identified. A suitable skin entry site  was selected and marked. The region was then sterilely prepped and draped in standard fashion using chlorhexidine skin prep. Local anesthesia was attained by infiltration with 1% lidocaine. A small dermatotomy was made.  Under real-time sonographic guidance, a 17 gauge trocar needle was advanced into the liver. Multiple 18 gauge core biopsies were then coaxially obtained. Needle placement was confirmed on all biopsy passes with real-time sonography. Biopsy specimens were placed in formalin and delivered to pathology for further analysis.  As the introducer needle was removed, the biopsy tract was embolized with a Gel-Foam slurry. Post biopsy ultrasound imaging demonstrates no active bleeding or perihepatic hematoma. The patient tolerated the procedure well.  IMPRESSION: Technically successful ultrasound-guided random core biopsy of the liver.  Electronically Signed   By: Jacqulynn Cadet M.D.   On: 08/24/2019 13:55 Note: Reviewed        Physical Exam  General appearance: Well nourished, well developed, and well hydrated. In no apparent acute distress Mental status: Alert, oriented x 3 (person, place, & time)       Respiratory: No evidence of acute respiratory distress Eyes: PERLA Vitals: BP (!) 147/84 (BP Location: Right Arm, Patient Position: Sitting, Cuff Size: Large)    Pulse 83    Temp (!) 97.3 F (36.3 C) (Temporal)    Resp 18    Ht _0  (1.6 m)    Wt 167 lb (75.8 kg)    LMP 05/04/2014    SpO2 99%    BMI 29.58 kg/m  BMI: Estimated body mass index is 29.58 kg/m as calculated from the following:  Height as of this encounter: _0  (1.6 m).   Weight as of this encounter: 167 lb (75.8 kg). Ideal: Ideal body weight: 52.4 kg (115 lb 8.3 oz) Adjusted ideal body weight: 61.7 kg (136 lb 1.8 oz)   Diffuse arthralgias of wrist, elbow, shoulder, hips, low back along with myalgias.  Assessment   Status Diagnosis  Controlled Controlled Controlled 1. Psoriatic arthritis ([place])    2. Chronic pain of both shoulders   3. 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 Chronic arthralgias of knees and hips   5. Glenohumeral arthritis, right   6. Chronic right shoulder pain   7. Anxiety and depression   8. Chronic pain syndrome   9. Fibromyalgia      Updated Problems: Problem  Joint Pain Due to Lyme Disease  Arthropathy of Right Shoulder  Chronic Pain  Right Shoulder Pain  Right Hip Pain  MIGRAINE W/AURA W/O INTRACT W/O STATUS MIGRNOSUS   Qualifier: Diagnosis of  By: Copland MD, Ella Bodo of Care   Ms. MARIECLAIRE BETTENHAUSEN has a current medication list which includes the following long-term medication(s): fluticasone, quetiapine, sertraline, sertraline, gabapentin, and omeprazole.  Pharmacotherapy (Medications Ordered): Meds ordered this encounter  Medications   gabapentin (NEURONTIN) 400 MG capsule    Sig: Take 1 capsule (400 mg total) by mouth at bedtime.    Dispense:  30 capsule    Refill:  2   tiZANidine (ZANAFLEX) 4 MG capsule    Sig: Take 1 capsule (4 mg total) by mouth at bedtime as needed for muscle spasms (leg cramps).    Dispense:  30 capsule    Refill:  2    Do not place this medication, or any other prescription from our practice, on "Automatic Refill". Patient may have prescription filled one day early if pharmacy is closed on scheduled refill date.   traMADol (ULTRAM) 50 MG tablet    Sig: Take 1 tablet (50 mg total) by mouth 3 (three) times daily as needed. FOR SHOULDER OR ARTHRITIS PAIN    Dispense:  90 tablet    Refill:  2    Generic ok   Magnesium 500 MG CAPS    Sig: Take 1 capsule (500 mg total) by mouth 2 (two) times daily at 8 am and 10 pm.    Dispense:  60 capsule    Refill:  5    Fill one day early if pharmacy is closed on scheduled refill date. May substitute for generic if available. May substitute with similar over-the-counter product.   Orders:  Orders Placed This Encounter  Procedures   ToxASSURE Select 13 (MW), Urine     Volume: 30 ml(s). Minimum 3 ml of urine is needed. Document temperature of fresh sample. Indications: Long term (current) use of opiate analgesic (H06.237)   Follow-up plan:   Return in about 4 months (around 06/10/2020) for Medication Management, in person.   Recent Visits No visits were found meeting these conditions. Showing recent visits within past 90 days and meeting all other requirements Today's Visits Date Type Provider Dept  02/09/20 Office Visit Gillis Santa, MD [facility]  Showing today's visits and meeting all other requirements Future Appointments No visits were found meeting these conditions. Showing future appointments within next 90 days and meeting all other requirements  I discussed the assessment and treatment plan with the patient. The patient was provided an opportunity to ask questions and all were answered. The patient agreed  with the plan and demonstrated an understanding of the instructions.  Patient advised to call back or seek an in-person evaluation if the symptoms or condition worsens.  Duration of encounter:65mnutes.  Note by: BGillis Santa MD Date: 02/09/2020; Time: 1:37 PM

## 2020-02-14 ENCOUNTER — Telehealth: Payer: Self-pay | Admitting: Internal Medicine

## 2020-02-14 DIAGNOSIS — F4310 Post-traumatic stress disorder, unspecified: Secondary | ICD-10-CM | POA: Diagnosis not present

## 2020-02-14 NOTE — Telephone Encounter (Signed)
Faxed reevaluating combination psychoactive therapy for prescription for Quetiapine 100 mg, sertraline 100 mg,trazodone 100 mg,traodone 50 mg to [facility] 02-14-20

## 2020-02-16 LAB — TOXASSURE SELECT 13 (MW), URINE

## 2020-02-19 ENCOUNTER — Telehealth: Payer: Self-pay | Admitting: Internal Medicine

## 2020-02-19 ENCOUNTER — Encounter: Payer: Self-pay | Admitting: Internal Medicine

## 2020-02-19 NOTE — Telephone Encounter (Addendum)
New letter written in epic and on the printer   Please fax to Dr. Chales Abrahams Contogiannis (plastic surgery) phone # [phone] not sure of fax #   Fax # [phone]  Thank you   TMS

## 2020-02-21 NOTE — Telephone Encounter (Signed)
Faxed via epic routing

## 2020-02-28 DIAGNOSIS — F4310 Post-traumatic stress disorder, unspecified: Secondary | ICD-10-CM | POA: Diagnosis not present

## 2020-03-06 DIAGNOSIS — M1612 Unilateral primary osteoarthritis, left hip: Secondary | ICD-10-CM | POA: Diagnosis not present

## 2020-03-06 DIAGNOSIS — M25552 Pain in left hip: Secondary | ICD-10-CM | POA: Diagnosis not present

## 2020-03-06 DIAGNOSIS — F4310 Post-traumatic stress disorder, unspecified: Secondary | ICD-10-CM | POA: Diagnosis not present

## 2020-03-09 DIAGNOSIS — F333 Major depressive disorder, recurrent, severe with psychotic symptoms: Secondary | ICD-10-CM | POA: Diagnosis not present

## 2020-03-09 DIAGNOSIS — F5105 Insomnia due to other mental disorder: Secondary | ICD-10-CM | POA: Diagnosis not present

## 2020-03-09 DIAGNOSIS — F4312 Post-traumatic stress disorder, chronic: Secondary | ICD-10-CM | POA: Diagnosis not present

## 2020-03-09 DIAGNOSIS — F419 Anxiety disorder, unspecified: Secondary | ICD-10-CM | POA: Diagnosis not present

## 2020-03-13 ENCOUNTER — Other Ambulatory Visit: Payer: Self-pay | Admitting: Student in an Organized Health Care Education/Training Program

## 2020-03-13 DIAGNOSIS — F4310 Post-traumatic stress disorder, unspecified: Secondary | ICD-10-CM | POA: Diagnosis not present

## 2020-03-13 DIAGNOSIS — F39 Unspecified mood [affective] disorder: Secondary | ICD-10-CM | POA: Diagnosis not present

## 2020-03-13 DIAGNOSIS — G894 Chronic pain syndrome: Secondary | ICD-10-CM

## 2020-03-13 DIAGNOSIS — F419 Anxiety disorder, unspecified: Secondary | ICD-10-CM | POA: Diagnosis not present

## 2020-03-13 DIAGNOSIS — F5105 Insomnia due to other mental disorder: Secondary | ICD-10-CM | POA: Diagnosis not present

## 2020-03-13 DIAGNOSIS — F4312 Post-traumatic stress disorder, chronic: Secondary | ICD-10-CM | POA: Diagnosis not present

## 2020-03-13 DIAGNOSIS — L4050 Arthropathic psoriasis, unspecified: Secondary | ICD-10-CM

## 2020-03-18 ENCOUNTER — Other Ambulatory Visit: Payer: Self-pay | Admitting: Internal Medicine

## 2020-03-18 DIAGNOSIS — F339 Major depressive disorder, recurrent, unspecified: Secondary | ICD-10-CM

## 2020-03-18 MED ORDER — LAMOTRIGINE 25 MG PO TABS: 25.0000 mg | ORAL_TABLET | Freq: Every day | ORAL | Status: DC

## 2020-03-18 MED ORDER — SERTRALINE HCL 100 MG PO TABS: 200.0000 mg | ORAL_TABLET | Freq: Every morning | ORAL | Status: DC

## 2020-03-21 ENCOUNTER — Telehealth: Payer: Self-pay | Admitting: Internal Medicine

## 2020-03-21 DIAGNOSIS — N62 Hypertrophy of breast: Secondary | ICD-10-CM | POA: Diagnosis not present

## 2020-03-21 NOTE — Telephone Encounter (Signed)
Faxed prescription refill request to [facility] for Trazodone on 03-21-20

## 2020-03-26 DIAGNOSIS — F4312 Post-traumatic stress disorder, chronic: Secondary | ICD-10-CM | POA: Diagnosis not present

## 2020-03-26 DIAGNOSIS — F5105 Insomnia due to other mental disorder: Secondary | ICD-10-CM | POA: Diagnosis not present

## 2020-03-26 DIAGNOSIS — F39 Unspecified mood [affective] disorder: Secondary | ICD-10-CM | POA: Diagnosis not present

## 2020-03-26 DIAGNOSIS — F419 Anxiety disorder, unspecified: Secondary | ICD-10-CM | POA: Diagnosis not present

## 2020-03-27 ENCOUNTER — Other Ambulatory Visit: Payer: Self-pay

## 2020-03-27 ENCOUNTER — Encounter (HOSPITAL_BASED_OUTPATIENT_CLINIC_OR_DEPARTMENT_OTHER): Payer: Self-pay | Admitting: Plastic Surgery

## 2020-03-30 ENCOUNTER — Other Ambulatory Visit (HOSPITAL_COMMUNITY)
Admission: RE | Admit: 2020-03-30 | Discharge: 2020-03-30 | Disposition: A | Discharge status: Home / Self Care | Payer: Federal, State, Local not specified - PPO | Source: Ambulatory Visit | Attending: Plastic Surgery | Admitting: Plastic Surgery

## 2020-03-30 DIAGNOSIS — Z20822 Contact with and (suspected) exposure to covid-19: Secondary | ICD-10-CM | POA: Insufficient documentation

## 2020-03-30 DIAGNOSIS — Z01812 Encounter for preprocedural laboratory examination: Secondary | ICD-10-CM | POA: Diagnosis not present

## 2020-03-30 LAB — SARS CORONAVIRUS 2 (TAT 6-24 HRS): SARS Coronavirus 2: NEGATIVE

## 2020-04-02 ENCOUNTER — Ambulatory Visit: Payer: Self-pay | Admitting: Plastic Surgery

## 2020-04-03 ENCOUNTER — Ambulatory Visit (HOSPITAL_BASED_OUTPATIENT_CLINIC_OR_DEPARTMENT_OTHER): Payer: Federal, State, Local not specified - PPO | Admitting: Anesthesiology

## 2020-04-03 ENCOUNTER — Encounter (HOSPITAL_BASED_OUTPATIENT_CLINIC_OR_DEPARTMENT_OTHER): Payer: Self-pay | Admitting: Plastic Surgery

## 2020-04-03 ENCOUNTER — Other Ambulatory Visit: Payer: Self-pay

## 2020-04-03 ENCOUNTER — Ambulatory Visit (HOSPITAL_BASED_OUTPATIENT_CLINIC_OR_DEPARTMENT_OTHER)
Admission: RE | Admit: 2020-04-03 | Discharge: 2020-04-03 | Disposition: A | Discharge status: Home / Self Care | Payer: Federal, State, Local not specified - PPO | Attending: Plastic Surgery | Admitting: Plastic Surgery

## 2020-04-03 ENCOUNTER — Encounter (HOSPITAL_BASED_OUTPATIENT_CLINIC_OR_DEPARTMENT_OTHER)
Admission: RE | Disposition: A | Discharge status: Home / Self Care | Payer: Self-pay | Source: Home / Self Care | Attending: Plastic Surgery

## 2020-04-03 DIAGNOSIS — D509 Iron deficiency anemia, unspecified: Secondary | ICD-10-CM | POA: Diagnosis not present

## 2020-04-03 DIAGNOSIS — Z87891 Personal history of nicotine dependence: Secondary | ICD-10-CM | POA: Insufficient documentation

## 2020-04-03 DIAGNOSIS — F329 Major depressive disorder, single episode, unspecified: Secondary | ICD-10-CM | POA: Diagnosis not present

## 2020-04-03 DIAGNOSIS — R011 Cardiac murmur, unspecified: Secondary | ICD-10-CM | POA: Diagnosis not present

## 2020-04-03 DIAGNOSIS — L4050 Arthropathic psoriasis, unspecified: Secondary | ICD-10-CM | POA: Diagnosis not present

## 2020-04-03 DIAGNOSIS — Z79899 Other long term (current) drug therapy: Secondary | ICD-10-CM | POA: Insufficient documentation

## 2020-04-03 DIAGNOSIS — K219 Gastro-esophageal reflux disease without esophagitis: Secondary | ICD-10-CM | POA: Insufficient documentation

## 2020-04-03 DIAGNOSIS — K449 Diaphragmatic hernia without obstruction or gangrene: Secondary | ICD-10-CM | POA: Insufficient documentation

## 2020-04-03 DIAGNOSIS — G8929 Other chronic pain: Secondary | ICD-10-CM | POA: Diagnosis not present

## 2020-04-03 DIAGNOSIS — Z9049 Acquired absence of other specified parts of digestive tract: Secondary | ICD-10-CM | POA: Insufficient documentation

## 2020-04-03 DIAGNOSIS — F419 Anxiety disorder, unspecified: Secondary | ICD-10-CM | POA: Insufficient documentation

## 2020-04-03 DIAGNOSIS — Z9884 Bariatric surgery status: Secondary | ICD-10-CM | POA: Insufficient documentation

## 2020-04-03 DIAGNOSIS — G43909 Migraine, unspecified, not intractable, without status migrainosus: Secondary | ICD-10-CM | POA: Diagnosis not present

## 2020-04-03 DIAGNOSIS — N62 Hypertrophy of breast: Secondary | ICD-10-CM | POA: Insufficient documentation

## 2020-04-03 DIAGNOSIS — M1990 Unspecified osteoarthritis, unspecified site: Secondary | ICD-10-CM | POA: Insufficient documentation

## 2020-04-03 DIAGNOSIS — G43109 Migraine with aura, not intractable, without status migrainosus: Secondary | ICD-10-CM | POA: Diagnosis not present

## 2020-04-03 DIAGNOSIS — Z91013 Allergy to seafood: Secondary | ICD-10-CM | POA: Diagnosis not present

## 2020-04-03 DIAGNOSIS — F418 Other specified anxiety disorders: Secondary | ICD-10-CM | POA: Diagnosis not present

## 2020-04-03 HISTORY — DX: Hypertrophy of breast: N62

## 2020-04-03 HISTORY — DX: Anxiety disorder, unspecified: F41.9

## 2020-04-03 HISTORY — PX: BREAST REDUCTION SURGERY: SHX8

## 2020-04-03 SURGERY — MAMMOPLASTY, REDUCTION
Anesthesia: General | Laterality: Bilateral

## 2020-04-03 MED ORDER — SODIUM CHLORIDE (PF) 0.9 % IJ SOLN
INTRAMUSCULAR | Status: AC
  Filled 2020-04-03: qty 10

## 2020-04-03 MED ORDER — FENTANYL CITRATE (PF) 100 MCG/2ML IJ SOLN
INTRAMUSCULAR | Status: DC | PRN
  Administered 2020-04-03 (×6): 50 ug via INTRAVENOUS

## 2020-04-03 MED ORDER — OXYCODONE HCL 5 MG/5ML PO SOLN: 5.0000 mg | Freq: Once | ORAL | Status: DC | PRN

## 2020-04-03 MED ORDER — OXYCODONE HCL 5 MG PO TABS: 5.0000 mg | ORAL_TABLET | Freq: Once | ORAL | Status: DC | PRN

## 2020-04-03 MED ORDER — LACTATED RINGERS IV SOLN: INTRAVENOUS | Status: DC

## 2020-04-03 MED ORDER — SUGAMMADEX SODIUM 500 MG/5ML IV SOLN
INTRAVENOUS | Status: AC
  Filled 2020-04-03: qty 5

## 2020-04-03 MED ORDER — CHLORHEXIDINE GLUCONATE CLOTH 2 % EX PADS: 6.0000 | MEDICATED_PAD | Freq: Once | CUTANEOUS | Status: DC

## 2020-04-03 MED ORDER — BUPIVACAINE HCL (PF) 0.25 % IJ SOLN
INTRAMUSCULAR | Status: AC
  Filled 2020-04-03: qty 30

## 2020-04-03 MED ORDER — LIDOCAINE HCL (PF) 1 % IJ SOLN
INTRAMUSCULAR | Status: AC
  Filled 2020-04-03: qty 30

## 2020-04-03 MED ORDER — LIDOCAINE HCL (CARDIAC) PF 100 MG/5ML IV SOSY
PREFILLED_SYRINGE | INTRAVENOUS | Status: DC | PRN
  Administered 2020-04-03 (×2): 60 mg via INTRAVENOUS

## 2020-04-03 MED ORDER — ROCURONIUM BROMIDE 100 MG/10ML IV SOLN
INTRAVENOUS | Status: DC | PRN
  Administered 2020-04-03: 70 mg via INTRAVENOUS
  Administered 2020-04-03: 20 mg via INTRAVENOUS

## 2020-04-03 MED ORDER — ROCURONIUM BROMIDE 10 MG/ML (PF) SYRINGE
PREFILLED_SYRINGE | INTRAVENOUS | Status: AC
  Filled 2020-04-03: qty 20

## 2020-04-03 MED ORDER — EPINEPHRINE PF 1 MG/ML IJ SOLN
INTRAMUSCULAR | Status: AC
  Filled 2020-04-03: qty 1

## 2020-04-03 MED ORDER — PROPOFOL 10 MG/ML IV BOLUS
INTRAVENOUS | Status: DC | PRN
  Administered 2020-04-03: 150 mg via INTRAVENOUS

## 2020-04-03 MED ORDER — MIDAZOLAM HCL 5 MG/5ML IJ SOLN
INTRAMUSCULAR | Status: DC | PRN
  Administered 2020-04-03: 2 mg via INTRAVENOUS

## 2020-04-03 MED ORDER — HYDROMORPHONE HCL 1 MG/ML IJ SOLN: 0.2500 mg | INTRAMUSCULAR | Status: DC | PRN

## 2020-04-03 MED ORDER — BUPIVACAINE LIPOSOME 1.3 % IJ SUSP
INTRAMUSCULAR | Status: AC
  Filled 2020-04-03: qty 20

## 2020-04-03 MED ORDER — SODIUM CHLORIDE (PF) 0.9 % IJ SOLN
INTRAMUSCULAR | Status: DC | PRN
  Administered 2020-04-03: 60 mL

## 2020-04-03 MED ORDER — MIDAZOLAM HCL 2 MG/2ML IJ SOLN
INTRAMUSCULAR | Status: AC
  Filled 2020-04-03: qty 2

## 2020-04-03 MED ORDER — FENTANYL CITRATE (PF) 100 MCG/2ML IJ SOLN
INTRAMUSCULAR | Status: AC
  Filled 2020-04-03: qty 2

## 2020-04-03 MED ORDER — ONDANSETRON HCL 4 MG/2ML IJ SOLN
INTRAMUSCULAR | Status: AC
  Filled 2020-04-03: qty 6

## 2020-04-03 MED ORDER — DEXAMETHASONE SODIUM PHOSPHATE 10 MG/ML IJ SOLN
INTRAMUSCULAR | Status: AC
  Filled 2020-04-03: qty 4

## 2020-04-03 MED ORDER — CEFAZOLIN SODIUM-DEXTROSE 2-4 GM/100ML-% IV SOLN
2.0000 g | INTRAVENOUS | Status: AC
  Administered 2020-04-03: 2 g via INTRAVENOUS

## 2020-04-03 MED ORDER — BUPIVACAINE LIPOSOME 1.3 % IJ SUSP
INTRAMUSCULAR | Status: DC | PRN
  Administered 2020-04-03: 70 mL

## 2020-04-03 MED ORDER — SUGAMMADEX SODIUM 200 MG/2ML IV SOLN
INTRAVENOUS | Status: DC | PRN
Start: 2020-04-03 — End: 2020-04-03
  Administered 2020-04-03: 200 mg via INTRAVENOUS

## 2020-04-03 MED ORDER — LIDOCAINE 2% (20 MG/ML) 5 ML SYRINGE
INTRAMUSCULAR | Status: AC
  Filled 2020-04-03: qty 5

## 2020-04-03 MED ORDER — PROMETHAZINE HCL 25 MG/ML IJ SOLN: 6.2500 mg | INTRAMUSCULAR | Status: DC | PRN

## 2020-04-03 MED ORDER — CEFAZOLIN SODIUM-DEXTROSE 2-4 GM/100ML-% IV SOLN
INTRAVENOUS | Status: AC
  Filled 2020-04-03: qty 100

## 2020-04-03 MED ORDER — LIDOCAINE-EPINEPHRINE 1 %-1:100000 IJ SOLN
INTRAMUSCULAR | Status: AC
  Filled 2020-04-03: qty 1

## 2020-04-03 MED ORDER — ONDANSETRON HCL 4 MG/2ML IJ SOLN
INTRAMUSCULAR | Status: DC | PRN
  Administered 2020-04-03: 4 mg via INTRAVENOUS

## 2020-04-03 MED ORDER — BUPIVACAINE HCL (PF) 0.5 % IJ SOLN
INTRAMUSCULAR | Status: AC
  Filled 2020-04-03: qty 30

## 2020-04-03 MED ORDER — AMISULPRIDE (ANTIEMETIC) 5 MG/2ML IV SOLN: 10.0000 mg | Freq: Once | INTRAVENOUS | Status: DC | PRN

## 2020-04-03 MED ORDER — EPHEDRINE SULFATE 50 MG/ML IJ SOLN
INTRAMUSCULAR | Status: DC | PRN
  Administered 2020-04-03 (×2): 10 mg via INTRAVENOUS
  Administered 2020-04-03: 15 mg via INTRAVENOUS

## 2020-04-03 MED ORDER — PHENYLEPHRINE HCL (PRESSORS) 10 MG/ML IV SOLN
INTRAVENOUS | Status: DC | PRN
Start: 2020-04-03 — End: 2020-04-03
  Administered 2020-04-03 (×2): 80 ug via INTRAVENOUS
  Administered 2020-04-03: 120 ug via INTRAVENOUS

## 2020-04-03 MED ORDER — BACITRACIN ZINC 500 UNIT/GM EX OINT
TOPICAL_OINTMENT | CUTANEOUS | Status: AC
  Filled 2020-04-03: qty 28.35

## 2020-04-03 MED ORDER — DEXAMETHASONE SODIUM PHOSPHATE 4 MG/ML IJ SOLN
INTRAMUSCULAR | Status: DC | PRN
  Administered 2020-04-03: 10 mg via INTRAVENOUS

## 2020-04-03 MED ORDER — LIDOCAINE HCL 1 % IJ SOLN
INTRAVENOUS | Status: DC | PRN
  Administered 2020-04-03: 1000 mL

## 2020-04-03 MED ORDER — PROPOFOL 500 MG/50ML IV EMUL
INTRAVENOUS | Status: DC | PRN
Start: 2020-04-03 — End: 2020-04-03
  Administered 2020-04-03: 25 ug/kg/min via INTRAVENOUS

## 2020-04-03 MED ORDER — MEPERIDINE HCL 25 MG/ML IJ SOLN: 6.2500 mg | INTRAMUSCULAR | Status: DC | PRN

## 2020-04-03 SURGICAL SUPPLY — 78 items
APL SKNCLS STERI-STRIP NONHPOA (GAUZE/BANDAGES/DRESSINGS) ×2
BAG DECANTER FOR FLEXI CONT (MISCELLANEOUS) ×2 IMPLANT
BENZOIN TINCTURE PRP APPL 2/3 (GAUZE/BANDAGES/DRESSINGS) ×4 IMPLANT
BLADE KNIFE PERSONA 10 (BLADE) ×7 IMPLANT
BLADE KNIFE PERSONA 15 (BLADE) ×5 IMPLANT
BNDG GAUZE ELAST 4 BULKY (GAUZE/BANDAGES/DRESSINGS) ×4 IMPLANT
CANISTER SUCT 1200ML W/VALVE (MISCELLANEOUS) ×2 IMPLANT
CAP BOUFFANT 24 BLUE NURSES (PROTECTIVE WEAR) ×2 IMPLANT
COVER BACK TABLE 60X90IN (DRAPES) ×2 IMPLANT
COVER MAYO STAND STRL (DRAPES) ×2 IMPLANT
COVER WAND RF STERILE (DRAPES) IMPLANT
DECANTER SPIKE VIAL GLASS SM (MISCELLANEOUS) ×3 IMPLANT
DEVICE DISSECT PLASMABLAD 3.0S (MISCELLANEOUS) IMPLANT
DEVICE DSSCT PLSMBLD 3.0S LGHT (MISCELLANEOUS) ×1 IMPLANT
DRAIN CHANNEL 10F 3/8 F FF (DRAIN) ×4 IMPLANT
DRAPE LAPAROSCOPIC ABDOMINAL (DRAPES) IMPLANT
DRAPE U-SHAPE 76X120 STRL (DRAPES) ×2 IMPLANT
DRSG EMULSION OIL 3X3 NADH (GAUZE/BANDAGES/DRESSINGS) ×4 IMPLANT
DRSG PAD ABDOMINAL 8X10 ST (GAUZE/BANDAGES/DRESSINGS) ×4 IMPLANT
ELECT REM PT RETURN 9FT ADLT (ELECTROSURGICAL) ×2
ELECTRODE REM PT RTRN 9FT ADLT (ELECTROSURGICAL) ×1 IMPLANT
EVACUATOR SILICONE 100CC (DRAIN) ×4 IMPLANT
FILTER 7/8 IN (FILTER) ×2 IMPLANT
GAUZE SPONGE 4X4 12PLY STRL (GAUZE/BANDAGES/DRESSINGS) ×4 IMPLANT
GLOVE BIO SURGEON STRL SZ 6.5 (GLOVE) ×4 IMPLANT
GLOVE BIO SURGEON STRL SZ7 (GLOVE) ×3 IMPLANT
GLOVE BIOGEL PI IND STRL 6.5 (GLOVE) IMPLANT
GLOVE BIOGEL PI IND STRL 7.0 (GLOVE) IMPLANT
GLOVE BIOGEL PI INDICATOR 6.5 (GLOVE) ×3
GLOVE BIOGEL PI INDICATOR 7.0 (GLOVE) ×5
GLOVE SURG SS PI 7.0 STRL IVOR (GLOVE) ×3 IMPLANT
GOWN STRL REUS W/ TWL LRG LVL3 (GOWN DISPOSABLE) ×1 IMPLANT
GOWN STRL REUS W/ TWL XL LVL3 (GOWN DISPOSABLE) IMPLANT
GOWN STRL REUS W/TWL LRG LVL3 (GOWN DISPOSABLE) ×2
GOWN STRL REUS W/TWL XL LVL3 (GOWN DISPOSABLE)
LINER CANISTER 1000CC FLEX (MISCELLANEOUS) ×4 IMPLANT
MARKER SKIN DUAL TIP RULER LAB (MISCELLANEOUS) ×2 IMPLANT
NDL HYPO 25X1 1.5 SAFETY (NEEDLE) ×2 IMPLANT
NDL SAFETY ECLIPSE 18X1.5 (NEEDLE) IMPLANT
NDL SPNL 18GX3.5 QUINCKE PK (NEEDLE) ×1 IMPLANT
NEEDLE HYPO 18GX1.5 SHARP (NEEDLE) ×2
NEEDLE HYPO 25X1 1.5 SAFETY (NEEDLE) ×6 IMPLANT
NEEDLE SPNL 18GX3.5 QUINCKE PK (NEEDLE) ×2 IMPLANT
NS IRRIG 1000ML POUR BTL (IV SOLUTION) ×4 IMPLANT
PACK BASIN DAY SURGERY FS (CUSTOM PROCEDURE TRAY) ×2 IMPLANT
PENCIL SMOKE EVACUATOR (MISCELLANEOUS) ×2 IMPLANT
PIN SAFETY STERILE (MISCELLANEOUS) ×2 IMPLANT
PLASMABLADE 3.0S (MISCELLANEOUS) ×2
PLASMABLADE 3.0S W/LIGHT (MISCELLANEOUS) ×2
SCRUB TECHNI CARE 4 OZ NO DYE (MISCELLANEOUS) ×2 IMPLANT
SHEET MEDIUM DRAPE 40X70 STRL (DRAPES) ×4 IMPLANT
SLEEVE SCD COMPRESS KNEE MED (MISCELLANEOUS) ×2 IMPLANT
SLEEVE SURGEON STRL (DRAPES) ×1 IMPLANT
SPONGE LAP 18X18 RF (DISPOSABLE) ×9 IMPLANT
STAPLER VISISTAT 35W (STAPLE) ×4 IMPLANT
STRIP CLOSURE SKIN 1/2X4 (GAUZE/BANDAGES/DRESSINGS) ×8 IMPLANT
SUT ETHILON 3 0 PS 1 (SUTURE) ×2 IMPLANT
SUT MNCRL AB 3-0 PS2 18 (SUTURE) ×8 IMPLANT
SUT MNCRL AB 4-0 PS2 18 (SUTURE) ×5 IMPLANT
SUT MON AB 5-0 PS2 18 (SUTURE) ×4 IMPLANT
SUT PROLENE 2 0 CT2 30 (SUTURE) ×2 IMPLANT
SUT PROLENE 3 0 PS 1 (SUTURE) ×4 IMPLANT
SUT VLOC 90 P-14 23 (SUTURE) ×4 IMPLANT
SYR 50ML LL SCALE MARK (SYRINGE) ×1 IMPLANT
SYR BULB IRRIG 60ML STRL (SYRINGE) ×4 IMPLANT
SYR CONTROL 10ML LL (SYRINGE) ×6 IMPLANT
SYR TB 1ML LL NO SAFETY (SYRINGE) ×2 IMPLANT
TAPE MEASURE VINYL STERILE (MISCELLANEOUS) ×2 IMPLANT
TRAY DSU PREP LF (CUSTOM PROCEDURE TRAY) ×4 IMPLANT
TRAY FOL W/BAG SLVR 16FR STRL (SET/KITS/TRAYS/PACK) IMPLANT
TRAY FOLEY W/BAG SLVR 14FR LF (SET/KITS/TRAYS/PACK) ×1 IMPLANT
TRAY FOLEY W/BAG SLVR 16FR LF (SET/KITS/TRAYS/PACK)
TUBE CONNECTING 20X1/4 (TUBING) ×2 IMPLANT
TUBING INFILTRATION IT-10001 (TUBING) ×2 IMPLANT
TUBING SET GRADUATE ASPIR 12FT (MISCELLANEOUS) ×2 IMPLANT
UNDERPAD 30X36 HEAVY ABSORB (UNDERPADS AND DIAPERS) ×4 IMPLANT
YANKAUER SUCT BULB TIP NO VENT (SUCTIONS) ×3 IMPLANT
topifoam ×1 IMPLANT

## 2020-04-03 NOTE — H&P (Signed)
  H&P faxed to surgical center.  -History and Physical Reviewed  -Patient has been re-examined  -No change in the plan of care  Jodie Cavey A    

## 2020-04-03 NOTE — Op Note (Signed)
OPERATIVE REPORT  04/03/2020  Barbara Faulkner  PREOPERATIVE DIAGNOSIS:  Bilateral macromastia.  POSTOPERATIVE DIAGNOSIS:  Bilateral Macromastia.  PROCEDURE:  Bilateral Reduction Mammoplasties.  ATTENDING SURGEON:  Eloise Levels, MD  ASSISTANT: Adrienne Mocha, RNFA  ANESTHESIA:  General.  ANESTHESIOLOGISTNevada Crane , MD  COMPLICATIONS:  None.  INDICATIONS FOR THE PROCEDURE:  The patient is a 54 y.o. female who has bilateral macromastia that is clinically symptomatic.  She presents to undergo bilateral reduction mammoplasties.  DESCRIPTION OF PROCEDURE:  The patient was marked in preop holding area in a pattern of Wise for the future bilateral reduction mammoplasties. She was then taken back to the OR, placed on the table in supine position.  After adequate general anesthesia was obtained, the patient's chest was prepped with Techni-Care and draped in sterile fashion.  The bases of the breasts have been infiltrated with 1% lidocaine with epinephrine.  After adequate hemostasis and anesthesia taken effect, the procedure was begun.  Both of the breast reductions were performed in the following similar manner.  The nipple-areolar complex was marked with a 45-mm nipple marker.  The skin was then incised and deepithelialized around the nipple-areolar complex down to the inframammary crease in the inferior pedicle pattern.  Next, the medial, superior, and lateral skin flaps were elevated down to the chest wall.  Excess fat and glandular tissue removed from the inferior pedicle.  The nipple-areolar complex was examined and found to be pink and viable.  The wound was irrigated with saline irrigation.  Meticulous hemostasis was obtained with the Bovie electrocautery.  Inferior pedicle was centralized using 3-0 Prolene suture.  A #10 JP flat fully fluted drain was placed into the wound. The skin flaps were brought together at the inverted T junction with a 2- 0 Prolene  suture.  The incisions were stapled for temporary closure. The breasts compared and found to have good shape and symmetry. I had infiltrated the lateral chest/axillary areas with tumescent fluid as noted on the accompanying nursing notes. I then used Power-assisted liposuction in the SAFE technique to remove the excess breast tissue in this area to decrease the length of the incisions laterally. A total of 450 cc was removed from the right side and 420 cc removed from the left side. The incisions were then closed from the medial aspect of the JP drain to the medial aspect of the [facility] incision by first placing a few 3-0 Monocryl sutures to tack together the dermal layer, and then both the dermal and cuticular layer were closed in a single layer using a 3-0 V-Lock barbed suture.  Lateral to the JP drain incision was closed using 3-0 Monocryl in the dermal layer, followed by 3-0 Monocryl running intracuticular stitch on the skin.  The vertical limb of the Wise pattern was closed in the dermal layer using 3-0 Monocryl suture.  The patient was placed in the upright position.  The future location of the nipple-areolar complexes was marked on both breast mounds using the 45-mm nipple marker.  She was then placed back in the recumbent position.  Both of the nipple areolar complexes were brought out onto the breast mounds in the following similar manner.  The skin was incised as marked and removed in full thickness into the subcutaneous tissues.  The nipple- areolar complex was examined, found to be pink and viable, then brought out through this aperture and sewn in place using 4-0 Monocryl in the dermal layer, followed by 5-0 Monocryl running intracuticular stitch on  the skin.  This 5-0 Monocryl suture was then brought down to close the cuticular layer of the vertical limb as well.  The JP drain was sewn in place using 3-0 nylon suture.  The pectoralis major muscle and fascia along with the breast and  chest soft tissues were then infiltrated with 1% Exparel (total 266 mg).  Now the [facility] incision was also infiltrated with the Exparel in order to give the patient postoperative pain control.  The incisions were dressed with benzoin, Steri-Strips, and the nipples dressed with bacitracin ointment and Adaptic.  4x4s were placed over the incisions and ABD pads in the axillary areas.  The patient was placed into a light postoperative support bra.  There were no complications. The patient tolerated the procedure well.  The final needle, sponge counts were reported to be correct at the end of the case.  The patient was then recovered without complications.  Both the patient and her family were given proper postoperative wound care instructions. She was then discharged home in the care of her family in stable condition.  Follow up will be with me in a few days in the office.         Eloise Levels, M.D.  04/03/2020 2:18 PM

## 2020-04-03 NOTE — Brief Op Note (Signed)
04/03/2020  2:15 PM  PATIENT:  [name]  54 y.o. female  PRE-OPERATIVE DIAGNOSIS:  BILATERAL MASCROMASTIA  POST-OPERATIVE DIAGNOSIS:  BILATERAL MASCROMASTIA  PROCEDURE:  Procedure(s) with comments: MAMMARY REDUCTION  (BREAST) (Bilateral) - HAVE LIPOSUCTION MACHINE AVAILABLE  SURGEON:  Surgeon(s) and Role:    * Dajuan Turnley, Chales Abrahams, MD - Primary  ASSISTANTS: Adrienne Mocha, RNFA   ANESTHESIA:   general  EBL:  125 mL   BLOOD ADMINISTERED:none  DRAINS: (17F) Jackson-Pratt drain(s) with closed bulb suction in the Bilateral Breasts   LOCAL MEDICATIONS USED:  1.3% Exparel (total 266 mgs.)  SPECIMEN:  Source of Specimen:  Bilateral Breasts  DISPOSITION OF SPECIMEN:  PATHOLOGY  COUNTS:  YES  DICTATION: .Note written in EPIC  PLAN OF CARE: Discharge to home after PACU  PATIENT DISPOSITION:  PACU - hemodynamically stable.   Delay start of Pharmacological VTE agent (>24hrs) due to surgical blood loss or risk of bleeding: not applicable

## 2020-04-03 NOTE — Anesthesia Preprocedure Evaluation (Signed)
Anesthesia Evaluation  Patient identified by MRN, date of birth, ID band Patient awake    Reviewed: Allergy & Precautions, NPO status , Patient's Chart, lab work & pertinent test results  History of Anesthesia Complications Negative for: history of anesthetic complications  Airway Mallampati: II  TM Distance: >3 FB Neck ROM: Full    Dental no notable dental hx.    Pulmonary neg sleep apnea, neg COPD, former smoker,    breath sounds clear to auscultation- rhonchi (-) wheezing      Cardiovascular Exercise Tolerance: Good (-) hypertension(-) CAD, (-) Past MI, (-) Cardiac Stents and (-) CABG  Rhythm:Regular Rate:Normal - Systolic murmurs and - Diastolic murmurs    Neuro/Psych  Headaches, PSYCHIATRIC DISORDERS Anxiety Depression    GI/Hepatic negative GI ROS, Neg liver ROS,   Endo/Other  negative endocrine ROSneg diabetes  Renal/GU negative Renal ROS     Musculoskeletal  (+) Arthritis ,   Abdominal (+) - obese,   Peds  Hematology negative hematology ROS (+)   Anesthesia Other Findings Past Medical History: No date: Allergy No date: Chronic pain No date: Chronic sinusitis No date: Depression No date: Heart murmur No date: HPV (human papilloma virus) anogenital infection No date: Migraine No date: Psoriatic arthritis ([facility])   Reproductive/Obstetrics                             Anesthesia Physical  Anesthesia Plan  ASA: II  Anesthesia Plan: General   Post-op Pain Management:    Induction: Intravenous  PONV Risk Score and Plan: 3 and Ondansetron, Dexamethasone, Midazolam and Treatment may vary due to age or medical condition  Airway Management Planned: Oral ETT  Additional Equipment:   Intra-op Plan:   Post-operative Plan: Extubation in OR  Informed Consent: I have reviewed the patients History and Physical, chart, labs and discussed the procedure including the risks, benefits  and alternatives for the proposed anesthesia with the patient or authorized representative who has indicated his/her understanding and acceptance.     Dental advisory given  Plan Discussed with: CRNA and Anesthesiologist  Anesthesia Plan Comments:         Anesthesia Quick Evaluation

## 2020-04-03 NOTE — Discharge Instructions (Signed)
Post Anesthesia Home Care Instructions  Activity: Get plenty of rest for the remainder of the day. A responsible individual must stay with you for 24 hours following the procedure.  For the next 24 hours, DO NOT: -Drive a car -Advertising copywriter -Drink alcoholic beverages -Take any medication unless instructed by your physician -Make any legal decisions or sign important papers.  Meals: Start with liquid foods such as gelatin or soup. Progress to regular foods as tolerated. Avoid greasy, spicy, heavy foods. If nausea and/or vomiting occur, drink only clear liquids until the nausea and/or vomiting subsides. Call your physician if vomiting continues.  Special Instructions/Symptoms: Your throat may feel dry or sore from the anesthesia or the breathing tube placed in your throat during surgery. If this causes discomfort, gargle with warm salt water. The discomfort should disappear within 24 hours.  If you had a scopolamine patch placed behind your ear for the management of post- operative nausea and/or vomiting:  1. The medication in the patch is effective for 72 hours, after which it should be removed.  Wrap patch in a tissue and discard in the trash. Wash hands thoroughly with soap and water. 2. You may remove the patch earlier than 72 hours if you experience unpleasant side effects which may include dry mouth, dizziness or visual disturbances. 3. Avoid touching the patch. Wash your hands with soap and water after contact with the patch.        Information for Discharge Teaching: EXPAREL (bupivacaine liposome injectable suspension)   Your surgeon or anesthesiologist gave you EXPAREL(bupivacaine) to help control your pain after surgery.   EXPAREL is a local anesthetic that provides pain relief by numbing the tissue around the surgical site.  EXPAREL is designed to release pain medication over time and can control pain for up to 72 hours.  Depending on how you respond to EXPAREL, you  may require less pain medication during your recovery.  Possible side effects:  Temporary loss of sensation or ability to move in the area where bupivacaine was injected.  Nausea, vomiting, constipation  Rarely, numbness and tingling in your mouth or lips, lightheadedness, or anxiety may occur.  Call your doctor right away if you think you may be experiencing any of these sensations, or if you have other questions regarding possible side effects.  Follow all other discharge instructions given to you by your surgeon or nurse. Eat a healthy diet and drink plenty of water or other fluids.  If you return to the hospital for any reason within 96 hours following the administration of EXPAREL, it is important for health care providers to know that you have received this anesthetic. A teal colored band has been placed on your arm with the date, time and amount of EXPAREL you have received in order to alert and inform your health care providers. Please leave this armband in place for the full 96 hours following administration, and then you may remove the band.       JP Drain [employer] this sheet to all of your post-operative appointments while you have your drains.  Please measure your drains by CC's or ML's.  Make sure you drain and measure your JP Drains 2 or 3 times per day.  At the end of each day, add up totals for the left side and add up totals for the right side.    ( 9 am )     ( 3 pm )        (  9 pm )                Date L  R  L  R  L  R  Total L/R                                                                                                                                                                                          1. No lifting greater than 5 lbs with arms for 4 weeks. 2. Empty, strip, record and reactivate JP drains 3 times a day. 3. Percocet 5/325 mg tabs 1-2 tabs po q 4-6 hours prn pain- prescription given in office. 4. Duricef 1 tab po bid-  prescription given in office. 5. Sterapred dose pack as directed- prescription given in office. 6. Follow-up appointment Friday in office.

## 2020-04-03 NOTE — Anesthesia Postprocedure Evaluation (Signed)
Anesthesia Post Note  Patient: Barbara Faulkner  Procedure(s) Performed: MAMMARY REDUCTION  (BREAST) (Bilateral )     Patient location during evaluation: PACU Anesthesia Type: General Level of consciousness: awake and alert Pain management: pain level controlled Vital Signs Assessment: post-procedure vital signs reviewed and stable Respiratory status: spontaneous breathing, nonlabored ventilation and respiratory function stable Cardiovascular status: blood pressure returned to baseline and stable Postop Assessment: no apparent nausea or vomiting Anesthetic complications: no   No complications documented.  Last Vitals:  Vitals:   04/03/20 1445 04/03/20 1500  BP: (!) 166/110 128/80  Pulse: (!) 105 (!) 107  Resp: 20 16  Temp:    SpO2: 100% 100%    Last Pain:  Vitals:   04/03/20 1445  TempSrc:   PainSc: 0-No pain                 Lowella Curb

## 2020-04-03 NOTE — Anesthesia Procedure Notes (Signed)
Procedure Name: Intubation Date/Time: 04/03/2020 7:48 AM Performed by: Signe Colt, CRNA Pre-anesthesia Checklist: Patient identified, Emergency Drugs available, Suction available and Patient being monitored Patient Re-evaluated:Patient Re-evaluated prior to induction Oxygen Delivery Method: Circle system utilized Preoxygenation: Pre-oxygenation with 100% oxygen Induction Type: IV induction Ventilation: Mask ventilation without difficulty Laryngoscope Size: Mac and 3 Grade View: Grade I Tube type: Oral Number of attempts: 1 Airway Equipment and Method: Stylet and Oral airway Placement Confirmation: ETT inserted through vocal cords under direct vision,  positive ETCO2 and breath sounds checked- equal and bilateral Tube secured with: Tape Dental Injury: Teeth and Oropharynx as per pre-operative assessment

## 2020-04-03 NOTE — Transfer of Care (Signed)
Immediate Anesthesia Transfer of Care Note  Patient: Barbara Faulkner  Procedure(s) Performed: MAMMARY REDUCTION  (BREAST) (Bilateral )  Patient Location: PACU  Anesthesia Type:General  Level of Consciousness: drowsy and patient cooperative  Airway & Oxygen Therapy: Patient Spontanous Breathing and Patient connected to face mask oxygen  Post-op Assessment: Report given to RN and Post -op Vital signs reviewed and stable  Post vital signs: Reviewed and stable  Last Vitals:  Vitals Value Taken Time  BP    Temp    Pulse    Resp    SpO2      Last Pain:  Vitals:   04/03/20 0655  TempSrc: Oral  PainSc: 0-No pain         Complications: No complications documented.

## 2020-04-04 ENCOUNTER — Encounter (HOSPITAL_BASED_OUTPATIENT_CLINIC_OR_DEPARTMENT_OTHER): Payer: Self-pay | Admitting: Plastic Surgery

## 2020-04-04 LAB — SURGICAL PATHOLOGY

## 2020-04-10 DIAGNOSIS — F4310 Post-traumatic stress disorder, unspecified: Secondary | ICD-10-CM | POA: Diagnosis not present

## 2020-04-12 DIAGNOSIS — F4312 Post-traumatic stress disorder, chronic: Secondary | ICD-10-CM | POA: Diagnosis not present

## 2020-04-12 DIAGNOSIS — F419 Anxiety disorder, unspecified: Secondary | ICD-10-CM | POA: Diagnosis not present

## 2020-04-12 DIAGNOSIS — F5105 Insomnia due to other mental disorder: Secondary | ICD-10-CM | POA: Diagnosis not present

## 2020-04-12 DIAGNOSIS — F39 Unspecified mood [affective] disorder: Secondary | ICD-10-CM | POA: Diagnosis not present

## 2020-05-03 DIAGNOSIS — F39 Unspecified mood [affective] disorder: Secondary | ICD-10-CM | POA: Diagnosis not present

## 2020-05-03 DIAGNOSIS — F5105 Insomnia due to other mental disorder: Secondary | ICD-10-CM | POA: Diagnosis not present

## 2020-05-03 DIAGNOSIS — F419 Anxiety disorder, unspecified: Secondary | ICD-10-CM | POA: Diagnosis not present

## 2020-05-03 DIAGNOSIS — F4312 Post-traumatic stress disorder, chronic: Secondary | ICD-10-CM | POA: Diagnosis not present

## 2020-05-22 ENCOUNTER — Other Ambulatory Visit: Payer: Self-pay | Admitting: Student in an Organized Health Care Education/Training Program

## 2020-05-22 DIAGNOSIS — A6920 Lyme disease, unspecified: Secondary | ICD-10-CM

## 2020-05-22 DIAGNOSIS — G894 Chronic pain syndrome: Secondary | ICD-10-CM

## 2020-05-23 ENCOUNTER — Ambulatory Visit: Payer: Federal, State, Local not specified - PPO | Admitting: Podiatry

## 2020-05-30 ENCOUNTER — Ambulatory Visit: Payer: Federal, State, Local not specified - PPO | Admitting: Podiatry

## 2020-06-07 ENCOUNTER — Ambulatory Visit
Payer: Federal, State, Local not specified - PPO | Attending: Student in an Organized Health Care Education/Training Program | Admitting: Student in an Organized Health Care Education/Training Program

## 2020-06-07 ENCOUNTER — Other Ambulatory Visit: Payer: Self-pay

## 2020-06-07 ENCOUNTER — Encounter: Payer: Self-pay | Admitting: Student in an Organized Health Care Education/Training Program

## 2020-06-07 VITALS — BP 118/76 | HR 79 | Temp 97.5°F | Resp 18 | Ht 63.0 in | Wt 162.0 lb

## 2020-06-07 DIAGNOSIS — G894 Chronic pain syndrome: Secondary | ICD-10-CM | POA: Insufficient documentation

## 2020-06-07 DIAGNOSIS — A6920 Lyme disease, unspecified: Secondary | ICD-10-CM

## 2020-06-07 DIAGNOSIS — M19011 Primary osteoarthritis, right shoulder: Secondary | ICD-10-CM | POA: Insufficient documentation

## 2020-06-07 DIAGNOSIS — M25511 Pain in right shoulder: Secondary | ICD-10-CM

## 2020-06-07 DIAGNOSIS — M25561 Pain in right knee: Secondary | ICD-10-CM

## 2020-06-07 DIAGNOSIS — M2550 Pain in unspecified joint: Secondary | ICD-10-CM | POA: Diagnosis not present

## 2020-06-07 DIAGNOSIS — F419 Anxiety disorder, unspecified: Secondary | ICD-10-CM | POA: Diagnosis not present

## 2020-06-07 DIAGNOSIS — M25551 Pain in right hip: Secondary | ICD-10-CM | POA: Insufficient documentation

## 2020-06-07 DIAGNOSIS — L4050 Arthropathic psoriasis, unspecified: Secondary | ICD-10-CM | POA: Diagnosis not present

## 2020-06-07 DIAGNOSIS — M25512 Pain in left shoulder: Secondary | ICD-10-CM | POA: Diagnosis not present

## 2020-06-07 DIAGNOSIS — F5105 Insomnia due to other mental disorder: Secondary | ICD-10-CM | POA: Diagnosis not present

## 2020-06-07 DIAGNOSIS — M25562 Pain in left knee: Secondary | ICD-10-CM | POA: Diagnosis not present

## 2020-06-07 DIAGNOSIS — F39 Unspecified mood [affective] disorder: Secondary | ICD-10-CM | POA: Diagnosis not present

## 2020-06-07 DIAGNOSIS — M25552 Pain in left hip: Secondary | ICD-10-CM | POA: Diagnosis not present

## 2020-06-07 DIAGNOSIS — G8929 Other chronic pain: Secondary | ICD-10-CM | POA: Insufficient documentation

## 2020-06-07 DIAGNOSIS — F32A Depression, unspecified: Secondary | ICD-10-CM | POA: Diagnosis not present

## 2020-06-07 DIAGNOSIS — F4312 Post-traumatic stress disorder, chronic: Secondary | ICD-10-CM | POA: Diagnosis not present

## 2020-06-07 MED ORDER — OXYCODONE-ACETAMINOPHEN 5-325 MG PO TABS: 1.0000 | ORAL_TABLET | Freq: Every day | ORAL | 0 refills | Status: AC | PRN

## 2020-06-07 MED ORDER — OXYCODONE-ACETAMINOPHEN 5-325 MG PO TABS: 1.0000 | ORAL_TABLET | Freq: Every day | ORAL | 0 refills | Status: DC | PRN

## 2020-06-07 NOTE — Progress Notes (Signed)
Nursing Pain Medication Assessment:  Safety precautions to be maintained throughout the outpatient stay will include: orient to surroundings, keep bed in low position, maintain call bell within reach at all times, provide assistance with transfer out of bed and ambulation.  Medication Inspection Compliance: Pill count conducted under aseptic conditions, in front of the patient. Neither the pills nor the bottle was removed from the patient's sight at any time. Once count was completed pills were immediately returned to the patient in their original bottle.  Medication: Tramadol (Ultram) Pill/Patch Count: 4 of 90 pills remain Pill/Patch Appearance: Markings consistent with prescribed medication Bottle Appearance: Standard pharmacy container. Clearly labeled. Filled Date: 06 / 10 / 2021 Last Medication intake:  Today

## 2020-06-07 NOTE — Progress Notes (Signed)
PROVIDER NOTE: Information contained herein reflects review and annotations entered in association with encounter. Interpretation of such information and data should be left to medically-trained personnel. Information provided to patient can be located elsewhere in the medical record under "Patient Instructions". Document created using STT-dictation technology, any transcriptional errors that may result from process are unintentional.    Patient: Barbara Faulkner  Service Category: E/M  Provider: Gillis Santa, MD  DOB: 1965/10/10  DOS: 06/07/2020  Specialty: Interventional Pain Management  MRN: [id number]  Setting: Ambulatory outpatient  PCP: McLean-Scocuzza, Nino Glow, MD  Type: Established Patient    Referring Provider: Orland Faulkner *  Location: Office  Delivery: Face-to-face     HPI  [name], a 54 y.o. year old female, is here today because of her Psoriatic arthritis (Barbara Faulkner) [L40.50]. Barbara Faulkner primary complain today is Pain (arthritis/ joint pain) Last encounter: My last encounter with her was on 05/22/2020. 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 Pain Assessment: Severity of Chronic pain is reported as a 8 /10. Location:  (all joints)  / . Onset: More than a month ago. Quality: Crushing. Timing: Constant. Modifying factor(s): medications, sqeezing. Vitals:  height is _0  (1.6 m) and weight is 162 lb (73.5 kg). Her temperature is 97.5 F (36.4 C) (abnormal). Her blood pressure is 118/76 and her pulse is 79. Her respiration is 18 and oxygen saturation is 98%.   Reason for encounter:   Patient follows up today for medication management.  She is status post breast reduction surgery on 04/03/2020.  This has helped improved her low back pain.  She states that she is ambulating and running as  well.  Of note she did have a left hip bursa injection with Dr. Candelaria Faulkner on 03/06/2020.  She did find this helpful.  She states that she utilizes Percocet after her surgery which was also very helpful for her pain related to arthralgias and myalgias related to psoriatic arthritis.  Patient is requesting to transition from tramadol to oxycodone.  She would utilize oxycodone daily as needed for severe breakthrough pain.    Pharmacotherapy Assessment   Analgesic: Tramadol 50 mg TID prn (2 fills since 09/08/2018)   Monitoring: Barbara Faulkner PMP: PDMP reviewed during this encounter.       Pharmacotherapy: No side-effects or adverse reactions reported. Compliance: No problems identified. Effectiveness: Clinically acceptable.  Barbara Rochester, RN  06/07/2020  8:37 AM  Sign when Signing Visit Nursing Pain Medication Assessment:  Safety precautions to be maintained throughout the outpatient stay will include: orient to surroundings, keep bed in low position, maintain call bell within reach at all times, provide assistance with transfer out of bed and ambulation.  Medication Inspection Compliance: Pill count conducted under aseptic conditions, in front of the patient. Neither the pills nor the bottle was removed from the patient's sight at any time. Once count was completed pills were immediately returned to the patient in their original bottle.  Medication: Tramadol (Ultram) Pill/Patch Count: 4 of 90 pills remain Pill/Patch Appearance: Markings consistent with prescribed medication Bottle Appearance: Standard pharmacy container. Clearly labeled. Filled Date: 06 / 10 / 2021 Last Medication intake:  Today    UDS:  Summary  Date Value Ref Range Status  02/08/2020 Note  Final    Comment:    ==================================================================== [employer] 13 (  MW) ==================================================================== Test                             Result       Flag        Units  Drug Present and Declared for Prescription Verification   Tramadol                       >4237        EXPECTED   ng/mg creat   O-Desmethyltramadol            >4237        EXPECTED   ng/mg creat   N-Desmethyltramadol            1276         EXPECTED   ng/mg creat    Source of tramadol is a prescription medication. O-desmethyltramadol    and N-desmethyltramadol are expected metabolites of tramadol.  ==================================================================== Test                      Result    Flag   Units      Ref Range   Creatinine              118              mg/dL      >=20 ==================================================================== Declared Medications:  The flagging and interpretation on this report are based on the  following declared medications.  Unexpected results may arise from  inaccuracies in the declared medications.   **Note: The testing scope of this panel includes these medications:   Tramadol (Ultram)   **Note: The testing scope of this panel does not include the  following reported medications:   Fluticasone (Flonase)  Gabapentin (Neurontin)  Iron  Magnesium (Mag-Ox)  Multivitamin  Omeprazole (Prilosec)  Quetiapine (Seroquel)  Sertraline (Zoloft)  Tizanidine (Zanaflex)  Topical Diclofenac ==================================================================== For clinical consultation, please call [phone]. ====================================================================      ROS  Constitutional: Denies any fever or chills Gastrointestinal: No reported hemesis, hematochezia, vomiting, or acute GI distress Musculoskeletal: Denies any acute onset joint swelling, redness, loss of ROM, or weakness Neurological: No reported episodes of acute onset apraxia, aphasia, dysarthria, agnosia, amnesia, paralysis, loss of coordination, or loss of consciousness  Medication Review  Magnesium, QUEtiapine, diclofenac sodium, fluticasone,  gabapentin, lamoTRIgine, oxyCODONE-acetaminophen, sertraline, traMADol, and traZODone  History Review  Allergy: Barbara Faulkner is allergic to shellfish allergy. Drug: Ms. Walraven  reports no history of drug use. Alcohol:  reports current alcohol use. Tobacco:  reports that she quit smoking about 16 years ago. Her smoking use included cigarettes. She has a 40.00 pack-year smoking history. She has never used smokeless tobacco. Social: Ms. Hazelett  reports that she quit smoking about 16 years ago. Her smoking use included cigarettes. She has a 40.00 pack-year smoking history. She has never used smokeless tobacco. She reports current alcohol use. She reports that she does not use drugs. Medical:  has a past medical history of Allergy, Anxiety, Chronic pain, Chronic sinusitis, Depression, Heart murmur, Hiatal hernia, HPV (human papilloma virus) anogenital infection, Macromastia, Migraine, and Psoriatic arthritis ([place]). Surgical: Ms. Kamm  has a past surgical history that includes Cholecystectomy (2009); Tubal ligation (1997); Laparoscopic gastric banding (2008 ); Gastric bypass; Breast biopsy (Right, 01/28/2018); Colonoscopy with propofol (N/A, 07/19/2018); Esophagogastroduodenoscopy (egd) with propofol (N/A, 07/19/2018); and Breast reduction surgery (Bilateral, 04/03/2020). Family: family history includes  HIV in her father; Hepatitis in her maternal uncle; Hyperlipidemia in her mother; Hypertension in her mother; Lung cancer in her maternal grandfather.  Laboratory Chemistry Profile   Renal Lab Results  Component Value Date   BUN 20 04/18/2019   CREATININE 1.16 99/37/1696   BCR NOT APPLICABLE 78/93/8101   GFR 59.63 (L) 04/18/2019   GFRAA >60 02/15/2014   GFRNONAA >60 02/15/2014     Hepatic Lab Results  Component Value Date   AST 54 (H) 08/10/2019   ALT 59 (H) 08/10/2019   ALBUMIN 4.3 08/10/2019   ALKPHOS 173 (H) 08/10/2019   AMYLASE 33 09/25/2014   LIPASE 124 10/04/2013      Electrolytes Lab Results  Component Value Date   NA 144 04/18/2019   K 3.9 04/18/2019   CL 112 04/18/2019   CALCIUM 8.5 04/18/2019   MG 1.9 07/14/2018   PHOS 5.0 (H) 09/25/2014     Bone Lab Results  Component Value Date   VD25OH 41.62 03/02/2019     Inflammation (CRP: Acute Phase) (ESR: Chronic Phase) Lab Results  Component Value Date   CRP <1.0 03/10/2019   ESRSEDRATE 3 03/10/2019       Note: Above Lab results reviewed.  Recent Imaging Review  US BIOPSY (LIVER) INDICATION: 54 year old female with elevated LFTs.  EXAM: Ultrasound-guided core biopsy of the liver  Interventional Radiologist:  Criselda Peaches, MD  MEDICATIONS: None.  ANESTHESIA/SEDATION: Fentanyl 100 mcg IV; Versed 2 mg IV  Moderate Sedation Time:  15 minutes  The patient was continuously monitored during the procedure by the interventional radiology nurse under my direct supervision.  FLUOROSCOPY TIME:  None.  COMPLICATIONS: None immediate.  PROCEDURE: Informed consent was obtained from the patient following explanation of the procedure, risks, benefits and alternatives. The patient understands, agrees and consents for the procedure. All questions were addressed. A time out was performed.  The right upper quadrant was interrogated with ultrasound. A relatively avascular plane of the liver was identified. A suitable skin entry site was selected and marked. The region was then sterilely prepped and draped in standard fashion using chlorhexidine skin prep. Local anesthesia was attained by infiltration with 1% lidocaine. A small dermatotomy was made.  Under real-time sonographic guidance, a 17 gauge trocar needle was advanced into the liver. Multiple 18 gauge core biopsies were then coaxially obtained. Needle placement was confirmed on all biopsy passes with real-time sonography. Biopsy specimens were placed in formalin and delivered to pathology for further analysis.  As the  introducer needle was removed, the biopsy tract was embolized with a Gel-Foam slurry. Post biopsy ultrasound imaging demonstrates no active bleeding or perihepatic hematoma. The patient tolerated the procedure well.  IMPRESSION: Technically successful ultrasound-guided random core biopsy of the liver.  Electronically Signed   By: Jacqulynn Cadet M.D.   On: 08/24/2019 13:55 Note: Reviewed        Physical Exam  General appearance: Well nourished, well developed, and well hydrated. In no apparent acute distress Mental status: Alert, oriented x 3 (person, place, & time)       Respiratory: No evidence of acute respiratory distress Eyes: PERLA Vitals: BP 118/76   Pulse 79   Temp (!) 97.5 F (36.4 C)   Resp 18   Ht _0  (1.6 m)   Wt 162 lb (73.5 kg)   LMP 05/04/2014   SpO2 98%   BMI 28.70 kg/m  BMI: Estimated body mass index is 28.7 kg/m as calculated from the following:   Height  as of this encounter: _0  (1.6 m).   Weight as of this encounter: 162 lb (73.5 kg). Ideal: Ideal body weight: 52.4 kg (115 lb 8.3 oz) Adjusted ideal body weight: 60.8 kg (134 lb 1.8 oz)  Bilateral shoulder pain, low back pain, bilateral leg pain. Normal gait Bilateral lower extremity strength Full range of motion of all extremities  Assessment   Status Diagnosis  Controlled Controlled Controlled 1. Psoriatic arthritis ([place])   2. Chronic pain of both shoulders   3. 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 Glenohumeral arthritis, right   5. Chronic right shoulder pain   6. Chronic arthralgias of knees and hips   7. Anxiety and depression   8. Chronic pain syndrome        Plan of Care  Ms. BRIHANNA DEVENPORT has a current medication list which includes the following long-term medication(s): fluticasone, gabapentin, lamotrigine, quetiapine, and sertraline.  Pharmacotherapy (Medications Ordered): Meds ordered this encounter  Medications  . oxyCODONE-acetaminophen (PERCOCET) 5-325 MG tablet     Sig: Take 1 tablet by mouth daily as needed for severe pain. Must last 30 days.    Dispense:  30 tablet    Refill:  0    Chronic Pain. (STOP Act - Not applicable). Fill one day early if closed on scheduled refill date.  [name] oxyCODONE-acetaminophen (PERCOCET) 5-325 MG tablet    Sig: Take 1 tablet by mouth daily as needed for severe pain. Must last 30 days.    Dispense:  30 tablet    Refill:  0    Chronic Pain. (STOP Act - Not applicable). Fill one day early if closed on scheduled refill date.  [name] oxyCODONE-acetaminophen (PERCOCET) 5-325 MG tablet    Sig: Take 1 tablet by mouth daily as needed for severe pain. Must last 30 days.    Dispense:  30 tablet    Refill:  0    Chronic Pain. (STOP Act - Not applicable). Fill one day early if closed on scheduled refill date.   Follow-up plan:   Return in about 3 months (around 09/07/2020) for Medication Management, in person.   Recent Visits No visits were found meeting these conditions. Showing recent visits within past 90 days and meeting all other requirements Today's Visits Date Type Provider Dept  06/07/20 Office Visit Barbara Santa, MD [facility]  Showing today's visits and meeting all other requirements Future Appointments Date Type Provider Dept  09/04/20 Appointment Barbara Santa, MD [facility]  Showing future appointments within next 90 days and meeting all other requirements  I discussed the assessment and treatment plan with the patient. The patient was provided an opportunity to ask questions and all were answered. The patient agreed with the plan and demonstrated an understanding of the instructions.  Patient advised to call back or seek an in-person evaluation if the symptoms or condition worsens.  Duration of encounter:30 minutes.  Note by: Barbara Santa, MD Date: 06/07/2020; Time: 12:26 PM

## 2020-06-27 ENCOUNTER — Encounter: Payer: Self-pay | Admitting: Student in an Organized Health Care Education/Training Program

## 2020-07-03 DIAGNOSIS — W010XXA Fall on same level from slipping, tripping and stumbling without subsequent striking against object, initial encounter: Secondary | ICD-10-CM | POA: Diagnosis not present

## 2020-07-03 DIAGNOSIS — M25561 Pain in right knee: Secondary | ICD-10-CM | POA: Diagnosis not present

## 2020-07-10 DIAGNOSIS — F4310 Post-traumatic stress disorder, unspecified: Secondary | ICD-10-CM | POA: Diagnosis not present

## 2020-07-17 IMAGING — MG DIGITAL SCREENING BILATERAL MAMMOGRAM WITH TOMO AND CAD
6 of 10 series · 6 of 30 positions shown · non-contrast
Comparison: Previous exam(s).

CLINICAL DATA: Screening.

EXAM:
DIGITAL SCREENING BILATERAL MAMMOGRAM WITH TOMO AND CAD

[R MLO synth-2D (1 of 2)]
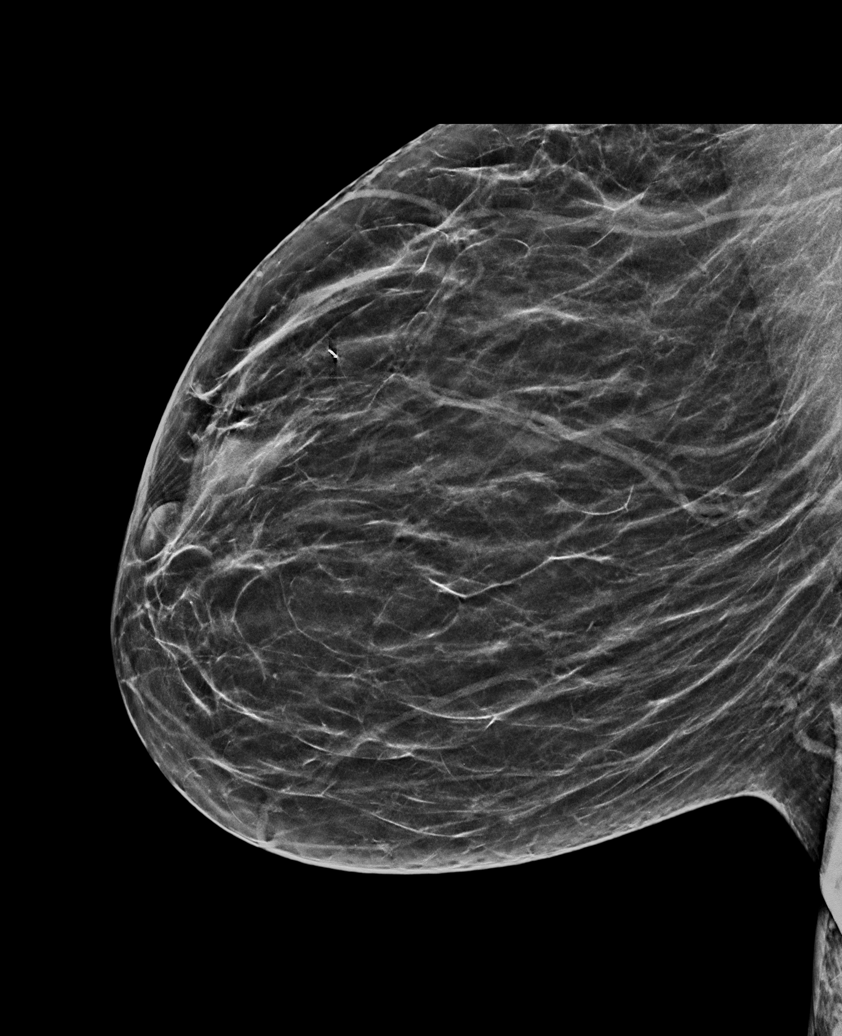

[L CC synth-2D]
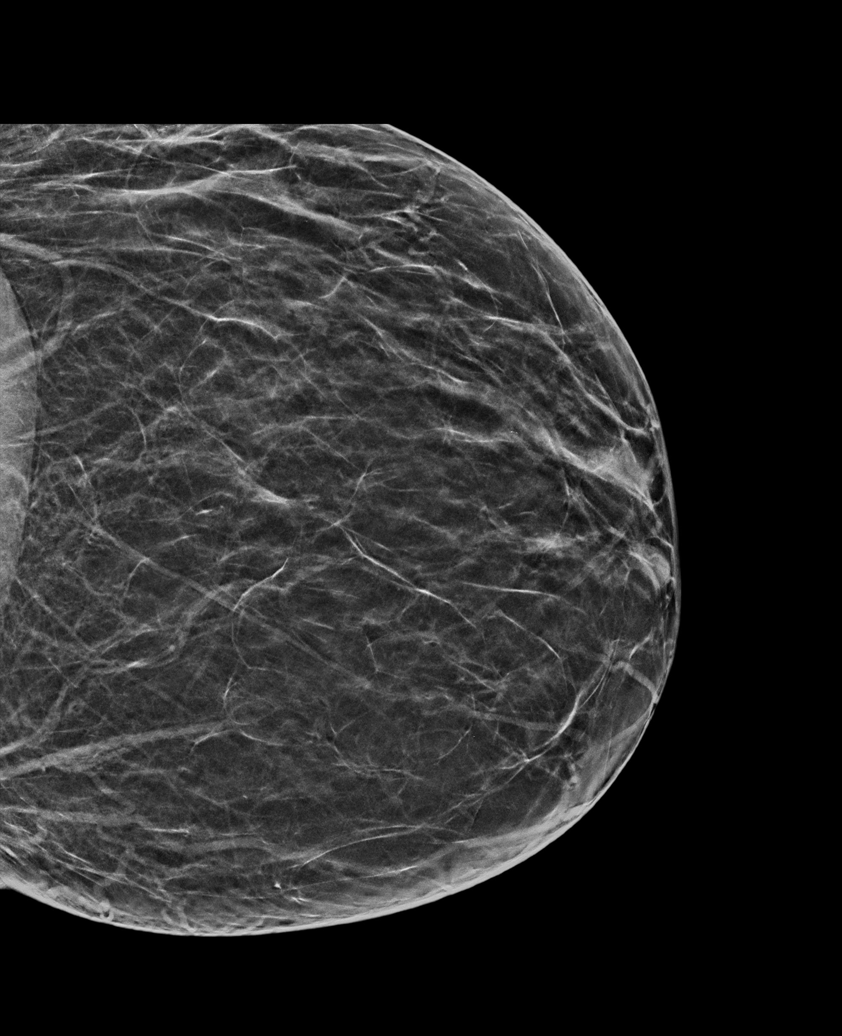

[R CC synth-2D]
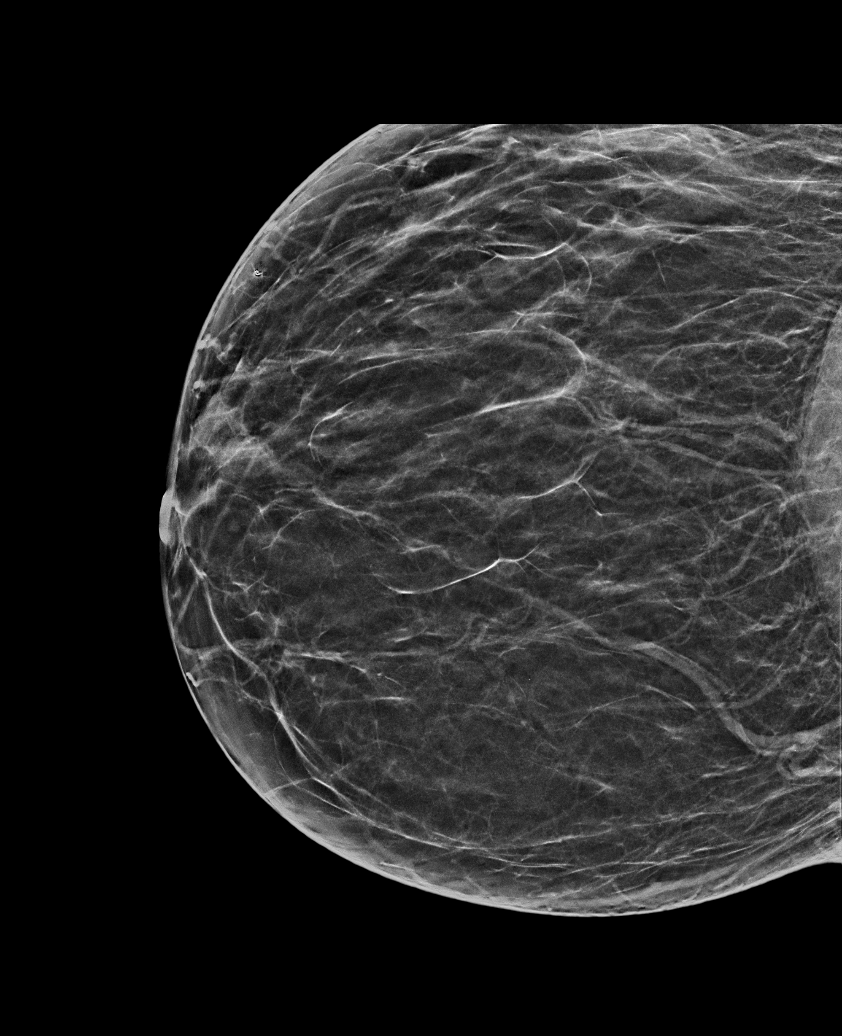

[L MLO synth-2D]
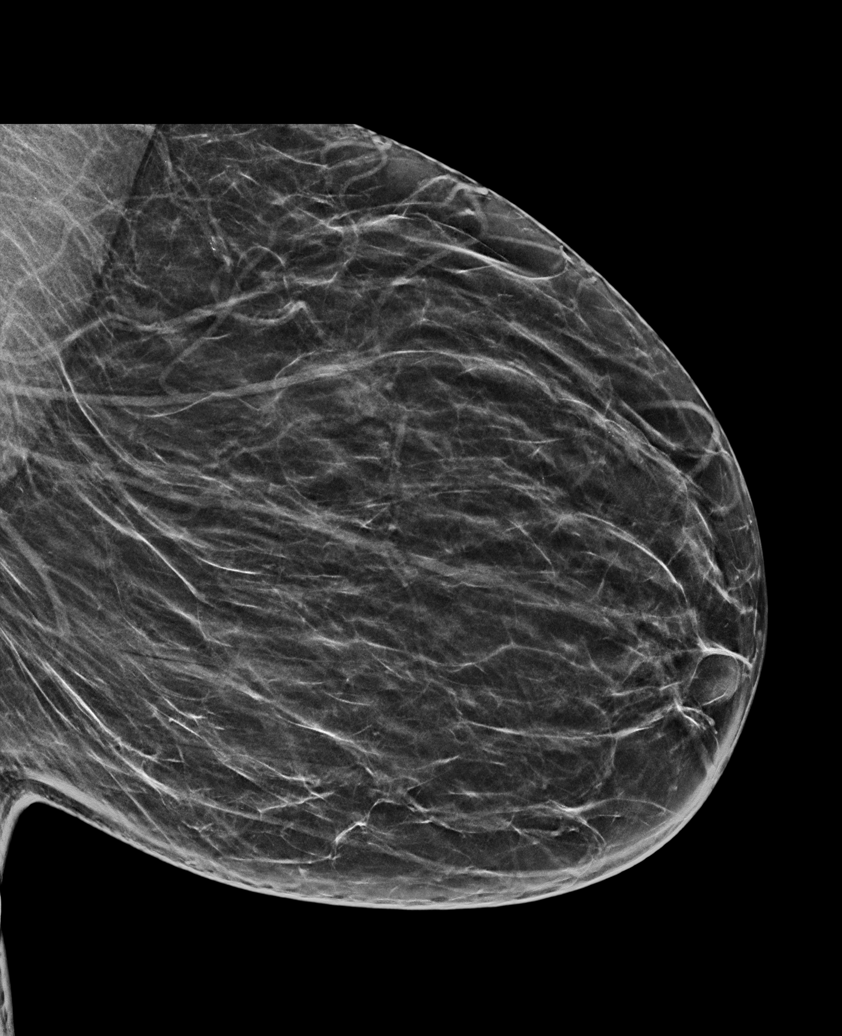

[R MLO synth-2D (2 of 2)]
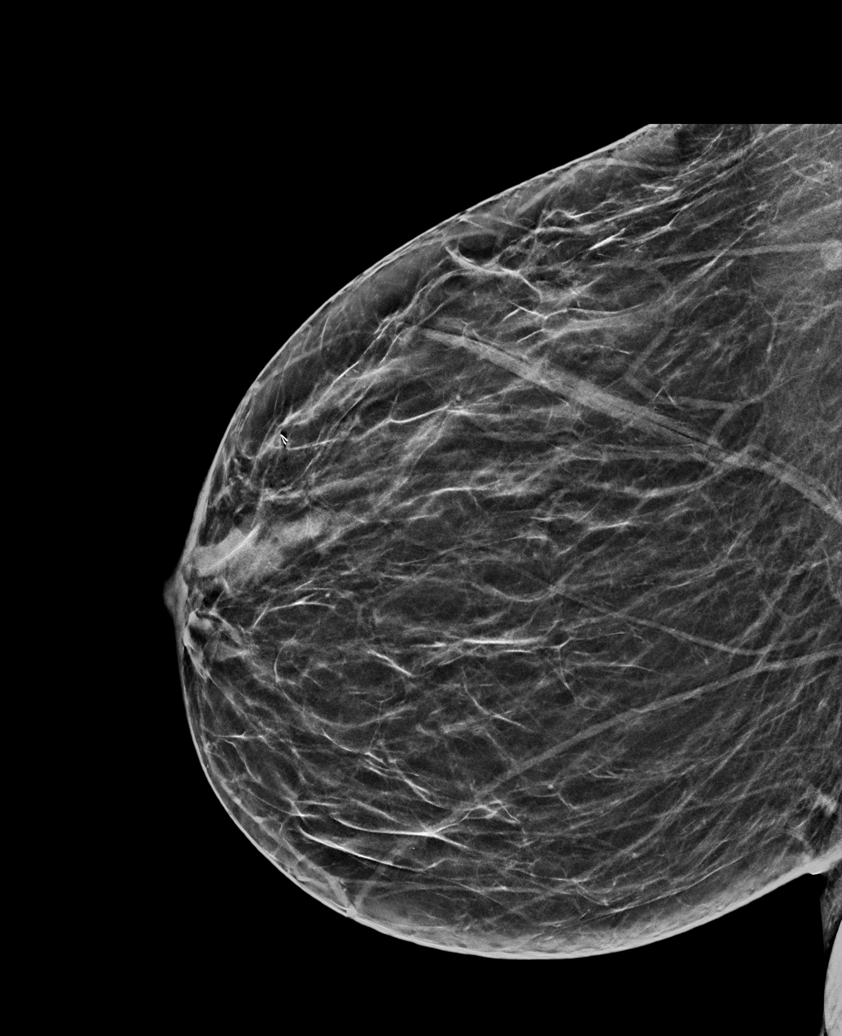

[L MLO tomo · tomo slice 29/56.0]
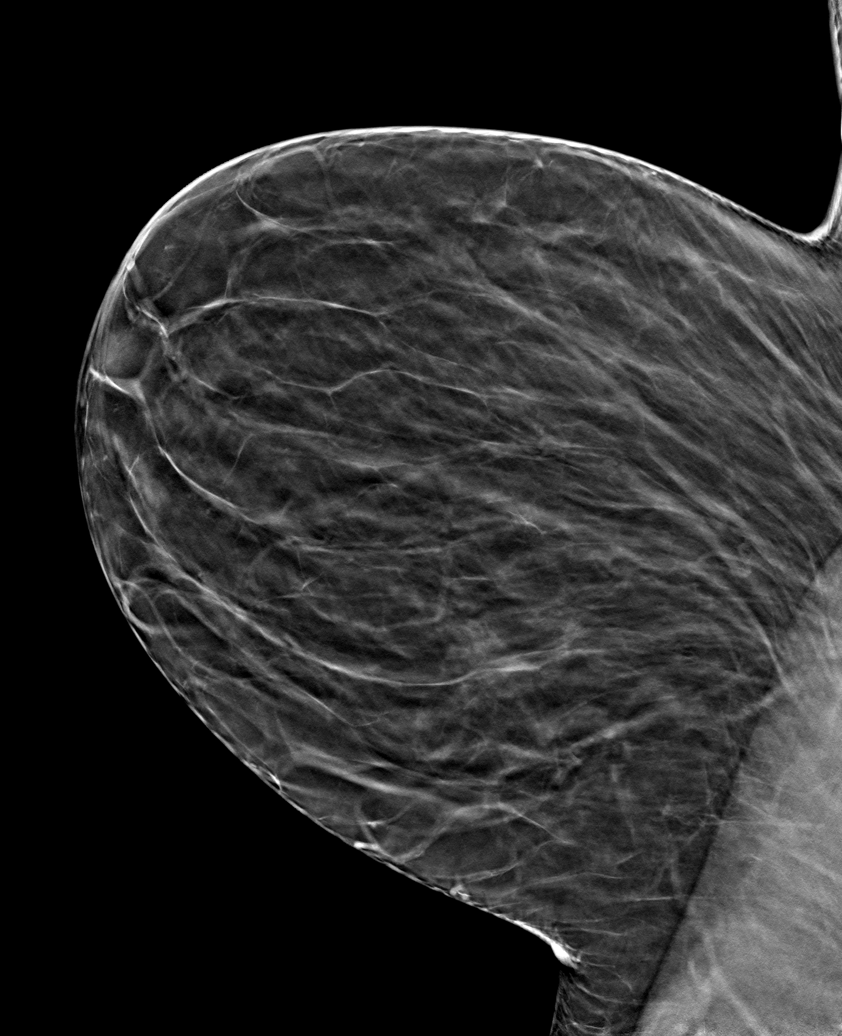

[6 of 30 positions shown; findings below may reference images not displayed]

ACR Breast Density Category b: There are scattered areas of
fibroglandular density.
FINDINGS: There are no findings suspicious for malignancy. Images were
processed with CAD.
IMPRESSION: No mammographic evidence of malignancy. A result letter of this
screening mammogram will be mailed directly to the patient.

RECOMMENDATION:
Screening mammogram in one year. (Code:[id number])

BI-RADS CATEGORY  1: Negative.

## 2020-08-01 DIAGNOSIS — N62 Hypertrophy of breast: Secondary | ICD-10-CM | POA: Diagnosis not present

## 2020-08-11 DIAGNOSIS — F4312 Post-traumatic stress disorder, chronic: Secondary | ICD-10-CM | POA: Diagnosis not present

## 2020-08-11 DIAGNOSIS — F5105 Insomnia due to other mental disorder: Secondary | ICD-10-CM | POA: Diagnosis not present

## 2020-08-11 DIAGNOSIS — F419 Anxiety disorder, unspecified: Secondary | ICD-10-CM | POA: Diagnosis not present

## 2020-08-11 DIAGNOSIS — F39 Unspecified mood [affective] disorder: Secondary | ICD-10-CM | POA: Diagnosis not present

## 2020-08-14 ENCOUNTER — Other Ambulatory Visit: Payer: Self-pay | Admitting: Student in an Organized Health Care Education/Training Program

## 2020-08-14 DIAGNOSIS — G894 Chronic pain syndrome: Secondary | ICD-10-CM

## 2020-08-14 DIAGNOSIS — A6920 Lyme disease, unspecified: Secondary | ICD-10-CM

## 2020-08-14 DIAGNOSIS — M2550 Pain in unspecified joint: Secondary | ICD-10-CM

## 2020-08-16 ENCOUNTER — Other Ambulatory Visit: Payer: Self-pay | Admitting: Student in an Organized Health Care Education/Training Program

## 2020-08-16 DIAGNOSIS — G894 Chronic pain syndrome: Secondary | ICD-10-CM

## 2020-08-16 DIAGNOSIS — A6920 Lyme disease, unspecified: Secondary | ICD-10-CM

## 2020-08-16 DIAGNOSIS — M2550 Pain in unspecified joint: Secondary | ICD-10-CM

## 2020-08-21 DIAGNOSIS — F4310 Post-traumatic stress disorder, unspecified: Secondary | ICD-10-CM | POA: Diagnosis not present

## 2020-08-31 DIAGNOSIS — Z1152 Encounter for screening for COVID-19: Secondary | ICD-10-CM | POA: Diagnosis not present

## 2020-08-31 DIAGNOSIS — Z03818 Encounter for observation for suspected exposure to other biological agents ruled out: Secondary | ICD-10-CM | POA: Diagnosis not present

## 2020-09-04 ENCOUNTER — Other Ambulatory Visit: Payer: Self-pay

## 2020-09-04 ENCOUNTER — Encounter: Payer: Self-pay | Admitting: Student in an Organized Health Care Education/Training Program

## 2020-09-04 ENCOUNTER — Ambulatory Visit
Payer: Federal, State, Local not specified - PPO | Attending: Student in an Organized Health Care Education/Training Program | Admitting: Student in an Organized Health Care Education/Training Program

## 2020-09-04 VITALS — BP 119/83 | HR 89 | Temp 97.2°F | Resp 16 | Ht 63.0 in | Wt 162.0 lb

## 2020-09-04 DIAGNOSIS — G8929 Other chronic pain: Secondary | ICD-10-CM | POA: Diagnosis not present

## 2020-09-04 DIAGNOSIS — G894 Chronic pain syndrome: Secondary | ICD-10-CM | POA: Insufficient documentation

## 2020-09-04 DIAGNOSIS — M25551 Pain in right hip: Secondary | ICD-10-CM | POA: Insufficient documentation

## 2020-09-04 DIAGNOSIS — A6920 Lyme disease, unspecified: Secondary | ICD-10-CM | POA: Diagnosis not present

## 2020-09-04 DIAGNOSIS — M797 Fibromyalgia: Secondary | ICD-10-CM | POA: Diagnosis not present

## 2020-09-04 DIAGNOSIS — M19011 Primary osteoarthritis, right shoulder: Secondary | ICD-10-CM | POA: Diagnosis not present

## 2020-09-04 DIAGNOSIS — F32A Depression, unspecified: Secondary | ICD-10-CM | POA: Diagnosis not present

## 2020-09-04 DIAGNOSIS — F419 Anxiety disorder, unspecified: Secondary | ICD-10-CM | POA: Insufficient documentation

## 2020-09-04 DIAGNOSIS — M25561 Pain in right knee: Secondary | ICD-10-CM | POA: Diagnosis not present

## 2020-09-04 DIAGNOSIS — L4050 Arthropathic psoriasis, unspecified: Secondary | ICD-10-CM | POA: Insufficient documentation

## 2020-09-04 DIAGNOSIS — M2550 Pain in unspecified joint: Secondary | ICD-10-CM | POA: Insufficient documentation

## 2020-09-04 DIAGNOSIS — M25512 Pain in left shoulder: Secondary | ICD-10-CM | POA: Insufficient documentation

## 2020-09-04 DIAGNOSIS — M25511 Pain in right shoulder: Secondary | ICD-10-CM | POA: Diagnosis not present

## 2020-09-04 DIAGNOSIS — M25562 Pain in left knee: Secondary | ICD-10-CM | POA: Insufficient documentation

## 2020-09-04 DIAGNOSIS — M25552 Pain in left hip: Secondary | ICD-10-CM | POA: Insufficient documentation

## 2020-09-04 MED ORDER — OXYCODONE-ACETAMINOPHEN 5-325 MG PO TABS: 1.0000 | ORAL_TABLET | Freq: Three times a day (TID) | ORAL | 0 refills | Status: DC | PRN

## 2020-09-04 MED ORDER — GABAPENTIN 400 MG PO CAPS: 400.0000 mg | ORAL_CAPSULE | Freq: Every day | ORAL | 2 refills | Status: DC

## 2020-09-04 MED ORDER — TRAMADOL HCL ER 100 MG PO TB24: 100.0000 mg | ORAL_TABLET | Freq: Every day | ORAL | 2 refills | Status: DC

## 2020-09-04 NOTE — Progress Notes (Signed)
PROVIDER NOTE: Information contained herein reflects review and annotations entered in association with encounter. Interpretation of such information and data should be left to medically-trained personnel. Information provided to patient can be located elsewhere in the medical record under "Patient Instructions". Document created using STT-dictation technology, any transcriptional errors that may result from process are unintentional.    Patient: Barbara Faulkner  Service Category: E/M  Provider: Gillis Santa, MD  DOB: Jan 18, 1966  DOS: 09/04/2020  Specialty: Interventional Pain Management  MRN: [id number]  Setting: Ambulatory outpatient  PCP: McLean-Scocuzza, Nino Glow, MD  Type: Established Patient    Referring Provider: Orland Mustard *  Location: Office  Delivery: Face-to-face     HPI  Barbara Faulkner, a 55 y.o. year old female, is here today because of her Psoriatic arthritis ([place]) [L40.50]. [name] primary complain today is Hip Pain (bilateral) and Knee Pain (bilateral) Last encounter: My last encounter with her was on 08/16/2020. Pertinent problems: 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 Pain Assessment: Severity of Chronic pain is reported as a 7 /10. Location: Hip Right,Left/ . Onset: More than a month ago. Quality: Sharp. Timing: Constant. Modifying factor(s): medications, massage. Vitals:  height is '5\' 3"'  (1.6 m) and weight is 162 lb (73.5 kg). Her temporal temperature is 97.2 F (36.2 C) (abnormal). Her blood pressure is 119/83 and her pulse is 89. Her respiration is 16 and oxygen saturation is 99%.   Reason for encounter: medication management.    Having more issues with her hiatal hernia (after bariatric surgery) Has more abdominal pain and bloating. She utilizes tramadol 50 mg twice daily to 3 times  daily as needed but states that she has been utilizing oxycodone more frequently to help manage her breakthrough pain.  We discussed transitioning to tramadol 100 mg ER and continuing hydrocodone primarily for breakthrough pain. Patient also continues her gabapentin 400 mg nightly She continues her psychotropic medications. Persistent hip pain and bilateral knee pain secondary to fibromyalgia  Pharmacotherapy Assessment   Analgesic: transition to tramadol 100 mg ER, continue oxycodone 5 mg daily for breakthrough pain.     Monitoring: [place] PMP: PDMP reviewed during this encounter.       Pharmacotherapy: No side-effects or adverse reactions reported. Compliance: No problems identified. Effectiveness: Clinically acceptable.  Landis Martins, RN  09/04/2020  8:37 AM  Sign when Signing Visit Nursing Pain Medication Assessment:  Safety precautions to be maintained throughout the outpatient stay will include: orient to surroundings, keep bed in low position, maintain call bell within reach at all times, provide assistance with transfer out of bed and ambulation.  Medication Inspection Compliance: Pill count conducted under aseptic conditions, in front of the patient. Neither the pills nor the bottle was removed from the patient's sight at any time. Once count was completed pills were immediately returned to the patient in their original bottle.  Medication: Tramadol (Ultram) Pill/Patch Count: 47 of 90 pills remain Pill/Patch Appearance: Markings consistent with prescribed medication Bottle Appearance: Standard pharmacy container. Clearly labeled. Filled Date: 07/11/2020 Last Medication intake:  Today  Landis Martins, RN  09/04/2020  8:15 AM  Sign when Signing Visit Nursing Pain Medication Assessment:  Safety precautions to be maintained throughout the outpatient stay will include: orient to surroundings, keep bed in low position, maintain call bell within reach at all times,  provide assistance  with transfer out of bed and ambulation.  Medication Inspection Compliance: Pill count conducted under aseptic conditions, in front of the patient. Neither the pills nor the bottle was removed from the patient's sight at any time. Once count was completed pills were immediately returned to the patient in their original bottle.  Medication: Oxycodone/APAP Pill/Patch Count: 9 of 30 pills remain Pill/Patch Appearance: Markings consistent with prescribed medication Bottle Appearance: Standard pharmacy container. Clearly labeled. Filled Date: 08/13/2020 Last Medication intake:  Today    UDS:  Summary  Date Value Ref Range Status  02/08/2020 Note  Final    Comment:    ==================================================================== [employer] Select 13 (MW) ==================================================================== Test                             Result       Flag       Units  Drug Present and Declared for Prescription Verification   Tramadol                       >4237        EXPECTED   ng/mg creat   O-Desmethyltramadol            >4237        EXPECTED   ng/mg creat   N-Desmethyltramadol            1276         EXPECTED   ng/mg creat    Source of tramadol is a prescription medication. O-desmethyltramadol    and N-desmethyltramadol are expected metabolites of tramadol.  ==================================================================== Test                      Result    Flag   Units      Ref Range   Creatinine              118              mg/dL      >=20 ==================================================================== Declared Medications:  The flagging and interpretation on this report are based on the  following declared medications.  Unexpected results may arise from  inaccuracies in the declared medications.   **Note: The testing scope of this panel includes these medications:   Tramadol (Ultram)   **Note: The testing scope of this panel does not include  the  following reported medications:   Fluticasone (Flonase)  Gabapentin (Neurontin)  Iron  Magnesium (Mag-Ox)  Multivitamin  Omeprazole (Prilosec)  Quetiapine (Seroquel)  Sertraline (Zoloft)  Tizanidine (Zanaflex)  Topical Diclofenac ==================================================================== For clinical consultation, please call [phone]. ====================================================================      ROS  Constitutional: Denies any fever or chills Gastrointestinal: No reported hemesis, hematochezia, vomiting, or acute GI distress Musculoskeletal: Denies any acute onset joint swelling, redness, loss of ROM, or weakness Neurological: No reported episodes of acute onset apraxia, aphasia, dysarthria, agnosia, amnesia, paralysis, loss of coordination, or loss of consciousness  Medication Review  QUEtiapine, diclofenac sodium, fluticasone, gabapentin, lamoTRIgine, oxyCODONE-acetaminophen, sertraline, and traMADol  History Review  Allergy: Ms. Demby is allergic to shellfish allergy. Drug: Ms. Somma  reports no history of drug use. Alcohol:  reports current alcohol use. Tobacco:  reports that she quit smoking about 17 years ago. Her smoking use included cigarettes. She has a 40.00 pack-year smoking history. She has never used smokeless tobacco. Social: Ms. Detore  reports that she quit smoking about 110  years ago. Her smoking use included cigarettes. She has a 40.00 pack-year smoking history. She has never used smokeless tobacco. She reports current alcohol use. She reports that she does not use drugs. Medical:  has a past medical history of Allergy, Anxiety, Chronic pain, Chronic sinusitis, Depression, Heart murmur, Hiatal hernia, HPV (human papilloma virus) anogenital infection, Macromastia, Migraine, and Psoriatic arthritis ([place]). Surgical: Ms. Cuneo  has a past surgical history that includes Cholecystectomy (2009); Tubal ligation (1997); Laparoscopic  gastric banding (2008 ); Gastric bypass; Breast biopsy (Right, 01/28/2018); Colonoscopy with propofol (N/A, 07/19/2018); Esophagogastroduodenoscopy (egd) with propofol (N/A, 07/19/2018); and Breast reduction surgery (Bilateral, 04/03/2020). Family: family history includes HIV in her father; Hepatitis in her maternal uncle; Hyperlipidemia in her mother; Hypertension in her mother; Lung cancer in her maternal grandfather.  Laboratory Chemistry Profile   Renal Lab Results  Component Value Date   BUN 20 04/18/2019   CREATININE 1.16 79/15/0569   BCR NOT APPLICABLE 79/48/0165   GFR 59.63 (L) 04/18/2019   GFRAA >60 02/15/2014   GFRNONAA >60 02/15/2014     Hepatic Lab Results  Component Value Date   AST 54 (H) 08/10/2019   ALT 59 (H) 08/10/2019   ALBUMIN 4.3 08/10/2019   ALKPHOS 173 (H) 08/10/2019   AMYLASE 33 09/25/2014   LIPASE 124 10/04/2013     Electrolytes Lab Results  Component Value Date   NA 144 04/18/2019   K 3.9 04/18/2019   CL 112 04/18/2019   CALCIUM 8.5 04/18/2019   MG 1.9 07/14/2018   PHOS 5.0 (H) 09/25/2014     Bone Lab Results  Component Value Date   VD25OH 41.62 03/02/2019     Inflammation (CRP: Acute Phase) (ESR: Chronic Phase) Lab Results  Component Value Date   CRP <1.0 03/10/2019   ESRSEDRATE 3 03/10/2019       Note: Above Lab results reviewed.  Recent Imaging Review  US BIOPSY (LIVER) INDICATION: 55 year old female with elevated LFTs.  EXAM: Ultrasound-guided core biopsy of the liver  Interventional Radiologist:  Criselda Peaches, MD  MEDICATIONS: None.  ANESTHESIA/SEDATION: Fentanyl 100 mcg IV; Versed 2 mg IV  Moderate Sedation Time:  15 minutes  The patient was continuously monitored during the procedure by the interventional radiology nurse under my direct supervision.  FLUOROSCOPY TIME:  None.  COMPLICATIONS: None immediate.  PROCEDURE: Informed consent was obtained from the patient following explanation of the  procedure, risks, benefits and alternatives. The patient understands, agrees and consents for the procedure. All questions were addressed. A time out was performed.  The right upper quadrant was interrogated with ultrasound. A relatively avascular plane of the liver was identified. A suitable skin entry site was selected and marked. The region was then sterilely prepped and draped in standard fashion using chlorhexidine skin prep. Local anesthesia was attained by infiltration with 1% lidocaine. A small dermatotomy was made.  Under real-time sonographic guidance, a 17 gauge trocar needle was advanced into the liver. Multiple 18 gauge core biopsies were then coaxially obtained. Needle placement was confirmed on all biopsy passes with real-time sonography. Biopsy specimens were placed in formalin and delivered to pathology for further analysis.  As the introducer needle was removed, the biopsy tract was embolized with a Gel-Foam slurry. Post biopsy ultrasound imaging demonstrates no active bleeding or perihepatic hematoma. The patient tolerated the procedure well.  IMPRESSION: Technically successful ultrasound-guided random core biopsy of the liver.  Electronically Signed   By: Jacqulynn Cadet M.D.   On: 08/24/2019 13:55 Note: Reviewed  Physical Exam  General appearance: Well nourished, well developed, and well hydrated. In no apparent acute distress Mental status: Alert, oriented x 3 (person, place, & time)       Respiratory: No evidence of acute respiratory distress Eyes: PERLA Vitals: BP 119/83   Pulse 89   Temp (!) 97.2 F (36.2 C) (Temporal)   Resp 16   Ht '5\' 3"'  (1.6 m)   Wt 162 lb (73.5 kg)   LMP 05/04/2014   SpO2 99%   BMI 28.70 kg/m  BMI: Estimated body mass index is 28.7 kg/m as calculated from the following:   Height as of this encounter: '5\' 3"'  (1.6 m).   Weight as of this encounter: 162 lb (73.5 kg). Ideal: Ideal body weight: 52.4 kg (115 lb 8.3  oz) Adjusted ideal body weight: 60.8 kg (134 lb 1.8 oz)  Bilateral shoulder, bilateral hip pain, bilateral knee pain  Assessment   Status Diagnosis  Controlled Controlled Controlled 1. Psoriatic arthritis ([place])   2. Chronic pain of both shoulders   3. 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 Glenohumeral arthritis, right   5. Chronic right shoulder pain   6. Chronic arthralgias of knees and hips   7. Anxiety and depression   8. Chronic pain syndrome   9. Fibromyalgia      Plan of Care   Ms. CHALISA KOBLER has a current medication list which includes the following long-term medication(s): fluticasone, gabapentin, lamotrigine, quetiapine, and sertraline.  1.  Discontinue tramadol 50 mg 3 times daily, transition to tramadol 100 mg ER for long-acting pain control, prescription below 2.  Continue oxycodone 5 mg daily as needed for breakthrough pain, prescription below 3.  Continue gabapentin 200 mg nightly, prescription below.  Pharmacotherapy (Medications Ordered): Meds ordered this encounter  Medications  . traMADol (ULTRAM-ER) 100 MG 24 hr tablet    Sig: Take 1 tablet (100 mg total) by mouth daily.    Dispense:  30 tablet    Refill:  2  . oxyCODONE-acetaminophen (PERCOCET) 5-325 MG tablet    Sig: Take 1 tablet by mouth every 8 (eight) hours as needed for severe pain.    Dispense:  90 tablet    Refill:  0    Chronic Pain. (STOP Act - Not applicable). Fill one day early if closed on scheduled refill date.  . gabapentin (NEURONTIN) 400 MG capsule    Sig: Take 1 capsule (400 mg total) by mouth at bedtime.    Dispense:  30 capsule    Refill:  2  Follow-up plan:   Return in about 3 months (around 12/03/2020) for Medication Management, in person.   Recent Visits Date Type Provider Dept  06/07/20 Office Visit Gillis Santa, MD [facility]  Showing recent visits within past 90 days and meeting all other requirements Today's Visits Date Type Provider Dept  09/04/20 Office  Visit Gillis Santa, MD [facility]  Showing today's visits and meeting all other requirements Future Appointments Date Type Provider Dept  11/29/20 Appointment Gillis Santa, MD [facility]  Showing future appointments within next 90 days and meeting all other requirements  I discussed the assessment and treatment plan with the patient. The patient was provided an opportunity to ask questions and all were answered. The patient agreed with the plan and demonstrated an understanding of the instructions.  Patient advised to call back or seek an in-person evaluation if the symptoms or condition worsens.  Duration of encounter:48mnutes.  Note by:  Gillis Santa, MD Date: 09/04/2020; Time: 9:46 AM

## 2020-09-04 NOTE — Progress Notes (Signed)
Nursing Pain Medication Assessment:  Safety precautions to be maintained throughout the outpatient stay will include: orient to surroundings, keep bed in low position, maintain call bell within reach at all times, provide assistance with transfer out of bed and ambulation.  Medication Inspection Compliance: Pill count conducted under aseptic conditions, in front of the patient. Neither the pills nor the bottle was removed from the patient's sight at any time. Once count was completed pills were immediately returned to the patient in their original bottle.  Medication: Oxycodone/APAP Pill/Patch Count: 9 of 30 pills remain Pill/Patch Appearance: Markings consistent with prescribed medication Bottle Appearance: Standard pharmacy container. Clearly labeled. Filled Date: 08/13/2020 Last Medication intake:  Today

## 2020-09-04 NOTE — Progress Notes (Signed)
Nursing Pain Medication Assessment:  Safety precautions to be maintained throughout the outpatient stay will include: orient to surroundings, keep bed in low position, maintain call bell within reach at all times, provide assistance with transfer out of bed and ambulation.  Medication Inspection Compliance: Pill count conducted under aseptic conditions, in front of the patient. Neither the pills nor the bottle was removed from the patient's sight at any time. Once count was completed pills were immediately returned to the patient in their original bottle.  Medication: Tramadol (Ultram) Pill/Patch Count: 47 of 90 pills remain Pill/Patch Appearance: Markings consistent with prescribed medication Bottle Appearance: Standard pharmacy container. Clearly labeled. Filled Date: 07/11/2020 Last Medication intake:  Today

## 2020-09-05 ENCOUNTER — Ambulatory Visit: Payer: Federal, State, Local not specified - PPO | Admitting: Internal Medicine

## 2020-09-12 DIAGNOSIS — Z20822 Contact with and (suspected) exposure to covid-19: Secondary | ICD-10-CM | POA: Diagnosis not present

## 2020-09-18 DIAGNOSIS — F4310 Post-traumatic stress disorder, unspecified: Secondary | ICD-10-CM | POA: Diagnosis not present

## 2020-09-21 ENCOUNTER — Ambulatory Visit
Admission: RE | Admit: 2020-09-21 | Discharge: 2020-09-21 | Disposition: A | Discharge status: Home / Self Care | Payer: Federal, State, Local not specified - PPO | Source: Ambulatory Visit | Attending: Internal Medicine | Admitting: Internal Medicine

## 2020-09-21 ENCOUNTER — Encounter: Payer: Self-pay | Admitting: Internal Medicine

## 2020-09-21 ENCOUNTER — Other Ambulatory Visit: Payer: Self-pay

## 2020-09-21 ENCOUNTER — Telehealth (INDEPENDENT_AMBULATORY_CARE_PROVIDER_SITE_OTHER): Payer: Federal, State, Local not specified - PPO | Admitting: Internal Medicine

## 2020-09-21 VITALS — Ht 63.0 in | Wt 162.0 lb

## 2020-09-21 DIAGNOSIS — Z1329 Encounter for screening for other suspected endocrine disorder: Secondary | ICD-10-CM

## 2020-09-21 DIAGNOSIS — Z1389 Encounter for screening for other disorder: Secondary | ICD-10-CM

## 2020-09-21 DIAGNOSIS — R0789 Other chest pain: Secondary | ICD-10-CM | POA: Diagnosis not present

## 2020-09-21 DIAGNOSIS — Z1231 Encounter for screening mammogram for malignant neoplasm of breast: Secondary | ICD-10-CM

## 2020-09-21 DIAGNOSIS — J321 Chronic frontal sinusitis: Secondary | ICD-10-CM | POA: Diagnosis not present

## 2020-09-21 DIAGNOSIS — E559 Vitamin D deficiency, unspecified: Secondary | ICD-10-CM

## 2020-09-21 DIAGNOSIS — J9801 Acute bronchospasm: Secondary | ICD-10-CM

## 2020-09-21 DIAGNOSIS — U071 COVID-19: Secondary | ICD-10-CM

## 2020-09-21 DIAGNOSIS — Z113 Encounter for screening for infections with a predominantly sexual mode of transmission: Secondary | ICD-10-CM

## 2020-09-21 DIAGNOSIS — E611 Iron deficiency: Secondary | ICD-10-CM

## 2020-09-21 DIAGNOSIS — Z0000 Encounter for general adult medical examination without abnormal findings: Secondary | ICD-10-CM

## 2020-09-21 MED ORDER — DEXAMETHASONE 6 MG PO TABS: 6.0000 mg | ORAL_TABLET | Freq: Two times a day (BID) | ORAL | 0 refills | Status: DC

## 2020-09-21 MED ORDER — ALBUTEROL SULFATE HFA 108 (90 BASE) MCG/ACT IN AERS: 1.0000 | INHALATION_SPRAY | Freq: Four times a day (QID) | RESPIRATORY_TRACT | 0 refills | Status: DC | PRN

## 2020-09-21 MED ORDER — AZITHROMYCIN 250 MG PO TABS: ORAL_TABLET | ORAL | 0 refills | Status: DC

## 2020-09-21 NOTE — Addendum Note (Signed)
Addended by: Warden Fillers on: 09/21/2020 03:43 PM   Modules accepted: Orders

## 2020-09-21 NOTE — Progress Notes (Signed)
Virtual Visit via Video Note  I connected with Barbara Faulkner  on 09/21/20 at  1:00 PM EST by a video enabled telemedicine application and verified that I am speaking with the correct person using two identifiers.  Location patient: home, [place] Location provider:work or home office Persons participating in the virtual visit: patient, provider, niece Leavy Cella  I discussed the limitations of evaluation and management by telemedicine and the availability of in person appointments. The patient expressed understanding and agreed to proceed.   HPI: Tested positive on 1/14. Having headaches frontal, neck pain, nasal nose bleeding and congestion, sore throat, chest pressure, sinus pain, pain when breathing, also feels like no matter how much she clears her throat something is there "stuck in throat", along with extreme fatigue.   Nothing over the counter is helping ( Mucinex, Tussen, allergy medications.   Denies cough, fever, sneezing, runny nose. No loss of taste but loss of smell x 4 days   Had 2/2 pfizer shots  ROS: See pertinent positives and negatives per HPI.  Past Medical History:  Diagnosis Date  . Allergy   . Anxiety   . Chronic pain   . Chronic sinusitis   . COVID-19    09/14/20  . Depression   . Heart murmur   . Hiatal hernia   . HPV (human papilloma virus) anogenital infection   . Macromastia   . Migraine   . Psoriatic arthritis [facility])     Past Surgical History:  Procedure Laterality Date  . BREAST BIOPSY Right 01/28/2018   US guided biopsy - heart shaped  . BREAST REDUCTION SURGERY Bilateral 04/03/2020   Procedure: MAMMARY REDUCTION  (BREAST);  Surgeon: Contogiannis, Chales Abrahams, MD;  Location: [facility];  Service: Plastics;  Laterality: Bilateral;  HAVE LIPOSUCTION MACHINE AVAILABLE  . CHOLECYSTECTOMY  2009  . COLONOSCOPY WITH PROPOFOL N/A 07/19/2018   Procedure: COLONOSCOPY WITH PROPOFOL;  Surgeon: Toney Reil, MD;  Location: [facility]COPY;  Service:  Gastroenterology;  Laterality: N/A;  . ESOPHAGOGASTRODUODENOSCOPY (EGD) WITH PROPOFOL N/A 07/19/2018   Procedure: ESOPHAGOGASTRODUODENOSCOPY (EGD) WITH PROPOFOL;  Surgeon: Toney Reil, MD;  Location: [facility] ENDOSCOPY;  Service: Gastroenterology;  Laterality: N/A;  . GASTRIC BYPASS     2015; duodenal switch   . LAPAROSCOPIC GASTRIC BANDING  2008    removed 2009  . TUBAL LIGATION  1997     Current Outpatient Medications:  .  albuterol (VENTOLIN HFA) 108 (90 Base) MCG/ACT inhaler, Inhale 1-2 puffs into the lungs every 6 (six) hours as needed for wheezing or shortness of breath (chest tightness)., Disp: 18 g, Rfl: 0 .  azithromycin (ZITHROMAX) 250 MG tablet, 2 pills day 1 and 1 pill day 2-5 with food, Disp: 6 tablet, Rfl: 0 .  dexamethasone (DECADRON) 6 MG tablet, Take 1 tablet (6 mg total) by mouth 2 (two) times daily with a meal. X 5-10 days with food, Disp: 10 tablet, Rfl: 0 .  fluticasone (FLONASE) 50 MCG/ACT nasal spray, Place 2 sprays into both nostrils daily. Prn, Disp: 16 g, Rfl: 6 .  gabapentin (NEURONTIN) 400 MG capsule, Take 1 capsule (400 mg total) by mouth at bedtime., Disp: 30 capsule, Rfl: 2 .  lamoTRIgine (LAMICTAL) 25 MG tablet, Take 1 tablet (25 mg total) by mouth daily. Dr. [name], Disp: , Rfl:  .  oxyCODONE-acetaminophen (PERCOCET) 5-325 MG tablet, Take 1 tablet by mouth every 8 (eight) hours as needed for severe pain., Disp: 90 tablet, Rfl: 0 .  QUEtiapine (SEROQUEL) 100 MG tablet,  Take 100 mg by mouth at bedtime., Disp: , Rfl:  .  sertraline (ZOLOFT) 100 MG tablet, Take 2 tablets (200 mg total) by mouth every morning. Dr. [name], Disp: , Rfl:  .  traMADol (ULTRAM-ER) 100 MG 24 hr tablet, Take 1 tablet (100 mg total) by mouth daily., Disp: 30 tablet, Rfl: 2 .  diclofenac sodium (VOLTAREN) 1 % GEL, Apply topically 4 (four) times daily. (Patient not taking: Reported on 09/21/2020), Disp: , Rfl:   EXAM:  VITALS per patient if applicable:  GENERAL: alert, oriented, appears  well and in no acute distress  HEENT: atraumatic, conjunttiva clear, no obvious abnormalities on inspection of external nose and ears +runny nose  NECK: normal movements of the head and neck  LUNGS: on inspection no signs of respiratory distress, breathing rate appears normal, no obvious gross SOB, gasping or wheezing  CV: no obvious cyanosis  MS: moves all visible extremities without noticeable abnormality  PSYCH/NEURO: pleasant and cooperative, no obvious depression or anxiety, speech and thought processing grossly intact  ASSESSMENT AND PLAN:  Discussed the following assessment and plan:  COVID-19 - Plan: DG Chest 2 View, dexamethasone (DECADRON) 6 MG tablet, albuterol (VENTOLIN HFA) 108 (90 Base) MCG/ACT inhaler, azithromycin (ZITHROMAX) 250 MG tablet  Bronchospasm - Plan: albuterol (VENTOLIN HFA) 108 (90 Base) MCG/ACT inhaler  Frontal sinusitis, unspecified chronicity - Plan: dexamethasone (DECADRON) 6 MG tablet, azithromycin (ZITHROMAX) 250 MG tablet    2/2 pfizer consider booster when well   There is no medication other than over the counter meds:  Mucinex dm green label for cough.  Vitamin C 1000 mg daily.  Vitamin D3 4000 Iu (units) daily.  Zinc 100 mg daily.  Quercetin 250-500 mg 2 times per day   Elderberry  Oil of oregano  cepacol or chloroseptic spray  Warm tea with honey and lemon  Hydration  Try to eat though you dont feel like it   Tylenol or Advil  Nasal saline  Flonase -hold due to nose bleeding    Monitor pulse oximeter, buy from [facility] if oxygen is less than 90 please go to the hospital.        Are you feeling really sick? Shortness of breath, cough, chest pain?, dizziness? Confusion   If so let me know  If worsening, go to hospital or [facility] Urgent care for further treatment   HM No flu this year  2/2 pfizer disc booster Tdap utd 04/23/11 Consider shingrix in future   Pap 08/24/18 +HPV negative pap refer to ob/GYN in 08/2019    abnormal mammogram bx neg necrosis and cystic change due 01/19/19 nl breast exam right axillary lymph node  -mammo 03/11/19 negative  -ordered for 2022 [place]    EGD/colonoscopy 07/19/18 ext hemorrhoids erosive gastritis bx mild gastritis   Former smoker quit 15 years ago smoked 10-13 years 1ppd   rec healthy diet and exercise   04/27/19 saw Dr. Dierdre Forth initially tx'ed psoriatic arthritis 2014 Dr. Koren Bound took MTX x 2 months w/o help hand Xray no evidence of erosions of bone no tx requreed and f/u pain clinic  Consider paxil fo cymbalta or gabapentin to lyrica which could be more beneficial per note  F/u in 6 months    08/04/2019 and 08/12/19 Dr. [name]  Anxiety/depression zoloft 100 mg qd, seroquel 100 mg qd A1C 4.6, TC 111, cont SEL therapy  Psych 08/29/19 depression/anxiety passive SI since 04/2019 no danger to self or others  Increase zoloft 150 mg qd, seroquel 100 mg qd,  rec SEL therapy  09/09/19 psych appt Dr. 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 [name] f/u recurrent depression/anxiety, insomnia son committed suicide 04/2019 chronic intermittent passive SI/thoughts no active thoughts no danger to herself phq9 score 3 mood stable cont zoloft 150 mg qd seroquel 100 mg qhs A1C 4.6 TC 111, TSH b12 folic acid normal   -we discussed possible serious and likely etiologies, options for evaluation and workup, limitations of telemedicine visit vs in person visit, treatment, treatment risks and precautions.  I discussed the assessment and treatment plan with the patient. The patient was provided an opportunity to ask questions and all were answered. The patient agreed with the plan and demonstrated an understanding of the instructions.    Time 20 min Bevelyn Buckles, MD

## 2020-09-21 NOTE — Progress Notes (Signed)
Tested positive on 1/14. Having headaches, congested, sore throat, chest pressure, sinus pain, pain when breathing, also feels like no matter how much she clears her throat something is there, along with fatigue.   Nothing over the counter is helping ( Mucinex, Tussen, allergy medications.

## 2020-09-21 NOTE — Patient Instructions (Signed)
There is no medication other than over the counter meds:  Mucinex dm green label for cough.  Vitamin C 1000 mg daily.  Vitamin D3 4000 Iu (units) daily.  Zinc 100 mg daily.  Quercetin 250-500 mg 2 times per day   Elderberry  Oil of oregano  cepacol or chloroseptic spray  Warm tea with honey and lemon  Hydration  Try to eat though you dont feel like it   Tylenol or Advil  Nasal saline  Flonase -stop if causing nose bleeds  If runny nose, sneezing consider zyrtec/allegra/claritin/xyzal    Monitor pulse oximeter, buy from [facility] if oxygen is less than 90 please go to the hospital.        Are you feeling really sick? Shortness of breath, cough, chest pain?, dizziness? Confusion   If so let me know  If worsening, go to hospital or [facility] Urgent care for further treatment

## 2020-09-25 ENCOUNTER — Other Ambulatory Visit: Payer: Self-pay | Admitting: Student in an Organized Health Care Education/Training Program

## 2020-09-25 ENCOUNTER — Ambulatory Visit: Payer: Federal, State, Local not specified - PPO | Admitting: Internal Medicine

## 2020-09-25 DIAGNOSIS — G894 Chronic pain syndrome: Secondary | ICD-10-CM

## 2020-09-25 DIAGNOSIS — L4050 Arthropathic psoriasis, unspecified: Secondary | ICD-10-CM

## 2020-09-26 ENCOUNTER — Encounter: Payer: Self-pay | Admitting: Internal Medicine

## 2020-10-08 ENCOUNTER — Encounter: Payer: Self-pay | Admitting: Student in an Organized Health Care Education/Training Program

## 2020-10-09 MED ORDER — TIZANIDINE HCL 4 MG PO TABS: 4.0000 mg | ORAL_TABLET | Freq: Three times a day (TID) | ORAL | 2 refills | Status: DC | PRN

## 2020-10-13 ENCOUNTER — Other Ambulatory Visit: Payer: Self-pay | Admitting: Internal Medicine

## 2020-10-13 DIAGNOSIS — J9801 Acute bronchospasm: Secondary | ICD-10-CM

## 2020-10-13 DIAGNOSIS — U071 COVID-19: Secondary | ICD-10-CM

## 2020-11-02 ENCOUNTER — Other Ambulatory Visit (INDEPENDENT_AMBULATORY_CARE_PROVIDER_SITE_OTHER): Payer: Federal, State, Local not specified - PPO

## 2020-11-02 ENCOUNTER — Encounter: Payer: Self-pay | Admitting: Internal Medicine

## 2020-11-02 ENCOUNTER — Telehealth (INDEPENDENT_AMBULATORY_CARE_PROVIDER_SITE_OTHER): Payer: Federal, State, Local not specified - PPO | Admitting: Internal Medicine

## 2020-11-02 ENCOUNTER — Other Ambulatory Visit: Payer: Self-pay

## 2020-11-02 VITALS — BP 124/80 | HR 80 | Temp 97.8°F | Ht 63.0 in | Wt 160.0 lb

## 2020-11-02 DIAGNOSIS — Z1389 Encounter for screening for other disorder: Secondary | ICD-10-CM | POA: Diagnosis not present

## 2020-11-02 DIAGNOSIS — E559 Vitamin D deficiency, unspecified: Secondary | ICD-10-CM

## 2020-11-02 DIAGNOSIS — Z0000 Encounter for general adult medical examination without abnormal findings: Secondary | ICD-10-CM

## 2020-11-02 DIAGNOSIS — E611 Iron deficiency: Secondary | ICD-10-CM

## 2020-11-02 DIAGNOSIS — R4189 Other symptoms and signs involving cognitive functions and awareness: Secondary | ICD-10-CM

## 2020-11-02 DIAGNOSIS — R519 Headache, unspecified: Secondary | ICD-10-CM

## 2020-11-02 DIAGNOSIS — R110 Nausea: Secondary | ICD-10-CM

## 2020-11-02 DIAGNOSIS — R079 Chest pain, unspecified: Secondary | ICD-10-CM | POA: Diagnosis not present

## 2020-11-02 DIAGNOSIS — Z113 Encounter for screening for infections with a predominantly sexual mode of transmission: Secondary | ICD-10-CM | POA: Diagnosis not present

## 2020-11-02 DIAGNOSIS — K449 Diaphragmatic hernia without obstruction or gangrene: Secondary | ICD-10-CM

## 2020-11-02 DIAGNOSIS — Z8669 Personal history of other diseases of the nervous system and sense organs: Secondary | ICD-10-CM

## 2020-11-02 DIAGNOSIS — Z1329 Encounter for screening for other suspected endocrine disorder: Secondary | ICD-10-CM | POA: Diagnosis not present

## 2020-11-02 DIAGNOSIS — K219 Gastro-esophageal reflux disease without esophagitis: Secondary | ICD-10-CM | POA: Insufficient documentation

## 2020-11-02 LAB — LIPID PANEL
Cholesterol: 106 mg/dL (ref 0–200)
HDL: 41.9 mg/dL (ref >39.00)
LDL Cholesterol: 53 mg/dL (ref 0–99)
NonHDL: 64.37
Total CHOL/HDL Ratio: 3
Triglycerides: 59 mg/dL (ref 0.0–149.0)
VLDL: 11.8 mg/dL (ref 0.0–40.0)

## 2020-11-02 LAB — CBC WITH DIFFERENTIAL/PLATELET
Basophils Absolute: 0 10*3/uL (ref 0.0–0.1)
Basophils Relative: 0.2 % (ref 0.0–3.0)
Eosinophils Absolute: 0.1 10*3/uL (ref 0.0–0.7)
Eosinophils Relative: 1.2 % (ref 0.0–5.0)
HCT: 36.7 % (ref 36.0–46.0)
Hemoglobin: 11.8 g/dL — ABNORMAL LOW (ref 12.0–15.0)
Lymphocytes Relative: 26 % (ref 12.0–46.0)
Lymphs Abs: 2.4 10*3/uL (ref 0.7–4.0)
MCHC: 32.2 g/dL (ref 30.0–36.0)
MCV: 84.2 fl (ref 78.0–100.0)
Monocytes Absolute: 0.5 10*3/uL (ref 0.1–1.0)
Monocytes Relative: 5.8 % (ref 3.0–12.0)
Neutro Abs: 6.3 10*3/uL (ref 1.4–7.7)
Neutrophils Relative %: 66.8 % (ref 43.0–77.0)
Platelets: 168 10*3/uL (ref 150.0–400.0)
RBC: 4.36 Mil/uL (ref 3.87–5.11)
RDW: 13.9 % (ref 11.5–15.5)
WBC: 9.4 10*3/uL (ref 4.0–10.5)

## 2020-11-02 LAB — COMPREHENSIVE METABOLIC PANEL
ALT: 55 U/L — ABNORMAL HIGH (ref 0–35)
AST: 52 U/L — ABNORMAL HIGH (ref 0–37)
Albumin: 4.1 g/dL (ref 3.5–5.2)
Alkaline Phosphatase: 257 U/L — ABNORMAL HIGH (ref 39–117)
BUN: 24 mg/dL — ABNORMAL HIGH (ref 6–23)
CO2: 23 mEq/L (ref 19–32)
Calcium: 7.9 mg/dL — ABNORMAL LOW (ref 8.4–10.5)
Chloride: 112 mEq/L (ref 96–112)
Creatinine, Ser: 1.31 mg/dL — ABNORMAL HIGH (ref 0.40–1.20)
GFR: 46.88 mL/min — ABNORMAL LOW (ref >60.00)
Glucose, Bld: 93 mg/dL (ref 70–99)
Potassium: 3.5 mEq/L (ref 3.5–5.1)
Sodium: 144 mEq/L (ref 135–145)
Total Bilirubin: 0.6 mg/dL (ref 0.2–1.2)
Total Protein: 6.2 g/dL (ref 6.0–8.3)

## 2020-11-02 LAB — D-DIMER, QUANTITATIVE: D-Dimer, Quant: <0.19 mcg/mL FEU (ref <0.50)

## 2020-11-02 LAB — TSH: TSH: 0.77 u[IU]/mL (ref 0.35–4.50)

## 2020-11-02 LAB — TROPONIN I (HIGH SENSITIVITY): High Sens Troponin I: 3 ng/L (ref 2–17)

## 2020-11-02 LAB — VITAMIN D 25 HYDROXY (VIT D DEFICIENCY, FRACTURES): VITD: 11.89 ng/mL — ABNORMAL LOW (ref 30.00–100.00)

## 2020-11-02 MED ORDER — CHOLECALCIFEROL 1.25 MG (50000 UT) PO CAPS: 50000.0000 [IU] | ORAL_CAPSULE | ORAL | 1 refills | Status: DC

## 2020-11-02 MED ORDER — ONDANSETRON HCL 4 MG PO TABS: 4.0000 mg | ORAL_TABLET | Freq: Three times a day (TID) | ORAL | 5 refills | Status: DC | PRN

## 2020-11-02 MED ORDER — RIZATRIPTAN BENZOATE 10 MG PO TABS: 10.0000 mg | ORAL_TABLET | ORAL | 5 refills | Status: DC | PRN

## 2020-11-02 MED ORDER — PANTOPRAZOLE SODIUM 40 MG PO TBEC
40.0000 mg | DELAYED_RELEASE_TABLET | Freq: Every day | ORAL | 3 refills | Status: DC
Start: 2020-11-02 — End: 2021-08-22

## 2020-11-02 NOTE — Patient Instructions (Signed)
You can try magnesium 250 mg daily for headache  Try maxalt and follow up with neurology   General Headache Without Cause A headache is pain or discomfort felt around the head or neck area. The specific cause of a headache may not be found. There are many causes and types of headaches. A few common ones are:  Tension headaches.  Migraine headaches.  Cluster headaches.  Chronic daily headaches. Follow these instructions at home: Watch your condition for any changes. Let your health care provider know about them. Take these steps to help with your condition: Managing pain  Take over-the-counter and prescription medicines only as told by your health care provider.  Lie down in a dark, quiet room when you have a headache.  If directed, put ice on your head and neck area: ? Put ice in a plastic bag. ? Place a towel between your skin and the bag. ? Leave the ice on for 20 minutes, 2-3 times per day.  If directed, apply heat to the affected area. Use the heat source that your health care provider recommends, such as a moist heat pack or a heating pad. ? Place a towel between your skin and the heat source. ? Leave the heat on for 20-30 minutes. ? Remove the heat if your skin turns bright red. This is especially important if you are unable to feel pain, heat, or cold. You may have a greater risk of getting burned.  Keep lights dim if bright lights bother you or make your headaches worse.      Eating and drinking  Eat meals on a regular schedule.  If you drink alcohol: ? Limit how much you use to:  0-1 drink a day for women.  0-2 drinks a day for men. ? Be aware of how much alcohol is in your drink. In the [place] one drink equals one 12 oz bottle of beer (355 mL), one 5 oz glass of wine (148 mL), or one 1 oz glass of hard liquor (44 mL).  Stop drinking caffeine, or decrease the amount of caffeine you drink. General instructions  Keep a headache journal to help find out what may  trigger your headaches. For example, write down: ? What you eat and drink. ? How much sleep you get. ? Any change to your diet or medicines.  Try massage or other relaxation techniques.  Limit stress.  Sit up straight, and do not tense your muscles.  Do not use any products that contain nicotine or tobacco, such as cigarettes, e-cigarettes, and chewing tobacco. If you need help quitting, ask your health care provider.  Exercise regularly as told by your health care provider.  Sleep on a regular schedule. Get 7-9 hours of sleep each night, or the amount recommended by your health care provider.  Keep all follow-up visits as told by your health care provider. This is important.   Contact a health care provider if:  Your symptoms are not helped by medicine.  You have a headache that is different from the usual headache.  You have nausea or you vomit.  You have a fever. Get help right away if:  Your headache becomes severe quickly.  Your headache gets worse after moderate to intense physical activity.  You have repeated vomiting.  You have a stiff neck.  You have a loss of vision.  You have problems with speech.  You have pain in the eye or ear.  You have muscular weakness or loss of muscle control.  You lose your balance or have trouble walking.  You feel faint or pass out.  You have confusion.  You have a seizure. Summary  A headache is pain or discomfort felt around the head or neck area.  There are many causes and types of headaches. In some cases, the cause may not be found.  Keep a headache journal to help find out what may trigger your headaches. Watch your condition for any changes. Let your health care provider know about them.  Contact a health care provider if you have a headache that is different from the usual headache, or if your symptoms are not helped by medicine.  Get help right away if your headache becomes severe, you vomit, you have a loss  of vision, you lose your balance, or you have a seizure. This information is not intended to replace advice given to you by your health care provider. Make sure you discuss any questions you have with your health care provider. Document Revised: 03/08/2018 Document Reviewed: 03/08/2018 Elsevier Patient Education  2021 [employer]  Migraine Headache A migraine headache is an intense, throbbing pain on one side or both sides of the head. Migraine headaches may also cause other symptoms, such as nausea, vomiting, and sensitivity to light and noise. A migraine headache can last from 4 hours to 3 days. Talk with your doctor about what things may bring on (trigger) your migraine headaches. What are the causes? The exact cause of this condition is not known. However, a migraine may be caused when nerves in the brain become irritated and release chemicals that cause inflammation of blood vessels. This inflammation causes pain. This condition may be triggered or caused by:  Drinking alcohol.  Smoking.  Taking medicines, such as: ? Medicine used to treat chest pain (nitroglycerin). ? Birth control pills. ? Estrogen. ? Certain blood pressure medicines.  Eating or drinking products that contain nitrates, glutamate, aspartame, or tyramine. Aged cheeses, chocolate, or caffeine may also be triggers.  Doing physical activity. Other things that may trigger a migraine headache include:  Menstruation.  Pregnancy.  Hunger.  Stress.  Lack of sleep or too much sleep.  Weather changes.  Fatigue. What increases the risk? The following factors may make you more likely to experience migraine headaches:  Being a certain age. This condition is more common in people who are 26-32 years old.  Being female.  Having a family history of migraine headaches.  Being Caucasian.  Having a mental health condition, such as depression or anxiety.  Being obese. What are the signs or symptoms? The main  symptom of this condition is pulsating or throbbing pain. This pain may:  Happen in any area of the head, such as on one side or both sides.  Interfere with daily activities.  Get worse with physical activity.  Get worse with exposure to bright lights or loud noises. Other symptoms may include:  Nausea.  Vomiting.  Dizziness.  General sensitivity to bright lights, loud noises, or smells. Before you get a migraine headache, you may get warning signs (an aura). An aura may include:  Seeing flashing lights or having blind spots.  Seeing bright spots, halos, or zigzag lines.  Having tunnel vision or blurred vision.  Having numbness or a tingling feeling.  Having trouble talking.  Having muscle weakness. Some people have symptoms after a migraine headache (postdromal phase), such as:  Feeling tired.  Difficulty concentrating. How is this diagnosed? A migraine headache can be diagnosed based on:  Your symptoms.  A physical exam.  Tests, such as: ? CT scan or an MRI of the head. These imaging tests can help rule out other causes of headaches. ? Taking fluid from the spine (lumbar puncture) and analyzing it (cerebrospinal fluid analysis, or CSF analysis). How is this treated? This condition may be treated with medicines that:  Relieve pain.  Relieve nausea.  Prevent migraine headaches. Treatment for this condition may also include:  Acupuncture.  Lifestyle changes like avoiding foods that trigger migraine headaches.  Biofeedback.  Cognitive behavioral therapy. Follow these instructions at home: Medicines  Take over-the-counter and prescription medicines only as told by your health care provider.  Ask your health care provider if the medicine prescribed to you: ? Requires you to avoid driving or using heavy machinery. ? Can cause constipation. You may need to take these actions to prevent or treat constipation:  Drink enough fluid to keep your urine pale  yellow.  Take over-the-counter or prescription medicines.  Eat foods that are high in fiber, such as beans, whole grains, and fresh fruits and vegetables.  Limit foods that are high in fat and processed sugars, such as fried or sweet foods. Lifestyle  Do not drink alcohol.  Do not use any products that contain nicotine or tobacco, such as cigarettes, e-cigarettes, and chewing tobacco. If you need help quitting, ask your health care provider.  Get at least 8 hours of sleep every night.  Find ways to manage stress, such as meditation, deep breathing, or yoga. General instructions  Keep a journal to find out what may trigger your migraine headaches. For example, write down: ? What you eat and drink. ? How much sleep you get. ? Any change to your diet or medicines.  If you have a migraine headache: ? Avoid things that make your symptoms worse, such as bright lights. ? It may help to lie down in a dark, quiet room. ? Do not drive or use heavy machinery. ? Ask your health care provider what activities are safe for you while you are experiencing symptoms.  Keep all follow-up visits as told by your health care provider. This is important.      Contact a health care provider if:  You develop symptoms that are different or more severe than your usual migraine headache symptoms.  You have more than 15 headache days in one month. Get help right away if:  Your migraine headache becomes severe.  Your migraine headache lasts longer than 72 hours.  You have a fever.  You have a stiff neck.  You have vision loss.  Your muscles feel weak or like you cannot control them.  You start to lose your balance often.  You have trouble walking.  You faint.  You have a seizure. Summary  A migraine headache is an intense, throbbing pain on one side or both sides of the head. Migraines may also cause other symptoms, such as nausea, vomiting, and sensitivity to light and noise.  This  condition may be treated with medicines and lifestyle changes. You may also need to avoid certain things that trigger a migraine headache.  Keep a journal to find out what may trigger your migraine headaches.  Contact your health care provider if you have more than 15 headache days in a month or you develop symptoms that are different or more severe than your usual migraine headache symptoms. This information is not intended to replace advice given to you by your health care provider. Make  sure you discuss any questions you have with your health care provider. Document Revised: 12/10/2018 Document Reviewed: 09/30/2018 Elsevier Patient Education  2021 [employer] Inc.  Nonspecific Chest Pain, Adult Chest pain can be caused by many different conditions. It can be caused by a condition that is life-threatening and requires treatment right away. It can also be caused by something that is not life-threatening. If you have chest pain, it can be hard to know the difference, so it is important to get help right away to make sure that you do not have a serious condition. Some life-threatening causes of chest pain include:  Heart attack.  A tear in the body's main blood vessel (aortic dissection).  Inflammation around your heart (pericarditis).  A problem in the lungs, such as a blood clot (pulmonary embolism) or a collapsed lung (pneumothorax). Some non life-threatening causes of chest pain include:  Heartburn.  Anxiety or stress.  Damage to the bones, muscles, and cartilage that make up your chest wall.  Pneumonia or bronchitis.  Shingles infection (varicella-zoster virus). Chest pain can feel like:  Pain or discomfort on the surface of your chest or deep in your chest.  Crushing, pressure, aching, or squeezing pain.  Burning or tingling.  Dull or sharp pain that is worse when you move, cough, or take a deep breath.  Pain or discomfort that is also felt in your back, neck, jaw,  shoulder, or arm, or pain that spreads to any of these areas. Your chest pain may come and go. It may also be constant. Your health care provider will do lab tests and other studies to find the cause of your pain. Treatment will depend on the cause of your chest pain. Follow these instructions at home: Medicines  Take over-the-counter and prescription medicines only as told by your health care provider.  If you were prescribed an antibiotic, take it as told by your health care provider. Do not stop taking the antibiotic even if you start to feel better. Lifestyle  Rest as directed by your health care provider.  Do not use any products that contain nicotine or tobacco, such as cigarettes and e-cigarettes. If you need help quitting, ask your health care provider.  Do not drink alcohol.  Make healthy lifestyle choices as recommended. These may include: ? Getting regular exercise. Ask your health care provider to suggest some activities that are safe for you. ? Eating a heart-healthy diet. This includes plenty of fresh fruits and vegetables, whole grains, low-fat (lean) protein, and low-fat dairy products. A dietitian can help you find healthy eating options. ? Maintaining a healthy weight. ? Managing any other health conditions you have, such as high blood pressure (hypertension) or diabetes. ? Reducing stress, such as with yoga or relaxation techniques.   General instructions  Pay attention to any changes in your symptoms. Tell your health care provider about them or any new symptoms.  Avoid any activities that cause chest pain.  Keep all follow-up visits as told by your health care provider. This is important. This includes visits for any further testing if your chest pain does not go away. Contact a health care provider if:  Your chest pain does not go away.  You feel depressed.  You have a fever. Get help right away if:  Your chest pain gets worse.  You have a cough that gets  worse, or you cough up blood.  You have severe pain in your abdomen.  You faint.  You have sudden, unexplained  chest discomfort.  You have sudden, unexplained discomfort in your arms, back, neck, or jaw.  You have shortness of breath at any time.  You suddenly start to sweat, or your skin gets clammy.  You feel nausea or you vomit.  You suddenly feel lightheaded or dizzy.  You have severe weakness, or unexplained weakness or fatigue.  Your heart begins to beat quickly, or it feels like it is skipping beats. These symptoms may represent a serious problem that is an emergency. Do not wait to see if the symptoms will go away. Get medical help right away. Call your local emergency services (911 in the [place] Do not drive yourself to the hospital. Summary  Chest pain can be caused by a condition that is serious and requires urgent treatment. It may also be caused by something that is not life-threatening.  If you have chest pain, it is very important to see your health care provider. Your health care provider may do lab tests and other studies to find the cause of your pain.  Follow your health care provider's instructions on taking medicines, making lifestyle changes, and getting emergency treatment if symptoms become worse.  Keep all follow-up visits as told by your health care provider. This includes visits for any further testing if your chest pain does not go away. This information is not intended to replace advice given to you by your health care provider. Make sure you discuss any questions you have with your health care provider. Document Revised: 02/18/2018 Document Reviewed: 02/18/2018 Elsevier Patient Education  2021 [employer]

## 2020-11-02 NOTE — Progress Notes (Signed)
Patient states she was exposed in December and having ongoing symptoms.  Chest pain, like an elephant on the chest, and bad headache.

## 2020-11-02 NOTE — Addendum Note (Signed)
Addended by: Quentin Ore on: 11/02/2020 06:01 PM   Modules accepted: Orders

## 2020-11-02 NOTE — Progress Notes (Signed)
Chief Complaint  Patient presents with  . Chest Pain  . Headache   F/u  1. C/o chest pressure on and off since 09/2020 having covid no radiation of pain she has GERD and hiatal hernia used to take prilosec no longer taking but reports this does not feel like GERD, stress is down Also right sided chest pain radiating to right back and right ribcage pain 8/10 nothing tried   2. S/p covid + c/o brain fog and ha (entire head) moderate to severe daily denies n/v at times lightheaded and nauseated with h/a has not checked BP she does have h/o migraines 24 years ago does not remember the medication she took    Review of Systems  Constitutional: Negative for weight loss.  HENT: Negative for hearing loss.   Eyes: Negative for blurred vision.  Respiratory: Negative for shortness of breath.   Cardiovascular: Positive for chest pain.  Gastrointestinal: Negative for abdominal pain.  Musculoskeletal: Negative for falls and joint pain.  Skin: Negative for rash.  Neurological: Positive for dizziness and headaches.  Psychiatric/Behavioral: The patient is not nervous/anxious.    Past Medical History:  Diagnosis Date  . Allergy   . Anxiety   . Chronic pain   . Chronic sinusitis   . COVID-19    09/14/20  . Depression   . Heart murmur   . Hiatal hernia   . HPV (human papilloma virus) anogenital infection   . Macromastia   . Migraine   . Psoriatic arthritis [facility])    Past Surgical History:  Procedure Laterality Date  . BREAST BIOPSY Right 01/28/2018   US guided biopsy - heart shaped  . BREAST REDUCTION SURGERY Bilateral 04/03/2020   Procedure: MAMMARY REDUCTION  (BREAST);  Surgeon: Contogiannis, Chales Abrahams, MD;  Location: [facility];  Service: Plastics;  Laterality: Bilateral;  HAVE LIPOSUCTION MACHINE AVAILABLE  . CHOLECYSTECTOMY  2009  . COLONOSCOPY WITH PROPOFOL N/A 07/19/2018   Procedure: COLONOSCOPY WITH PROPOFOL;  Surgeon: Toney Reil, MD;  Location: [facility] ENDOSCOPY;   Service: Gastroenterology;  Laterality: N/A;  . ESOPHAGOGASTRODUODENOSCOPY (EGD) WITH PROPOFOL N/A 07/19/2018   Procedure: ESOPHAGOGASTRODUODENOSCOPY (EGD) WITH PROPOFOL;  Surgeon: Toney Reil, MD;  Location: [facility] ENDOSCOPY;  Service: Gastroenterology;  Laterality: N/A;  . GASTRIC BYPASS     2015; duodenal switch   . LAPAROSCOPIC GASTRIC BANDING  2008    removed 2009  . TUBAL LIGATION  1997   Family History  Problem Relation Age of Onset  . HIV Father   . Hypertension Mother   . Hyperlipidemia Mother   . Lung cancer Maternal Grandfather        smoker  . Hepatitis Maternal Uncle        drug use  . Colon cancer Neg Hx   . Colon polyps Neg Hx   . Rectal cancer Neg Hx   . Stomach cancer Neg Hx   . Breast cancer Neg Hx    Social History   Socioeconomic History  . Marital status: Married    Spouse name: Not on file  . Number of children: 2  . Years of education: Not on file  . Highest education level: Not on file  Occupational History  . Occupation: Forensic scientist, Publishing rights manager: [employer]  Tobacco Use  . Smoking status: Former Smoker    Packs/day: 2.00    Years: 20.00    Pack years: 40.00    Types: Cigarettes    Quit date: 09/02/2003  Years since quitting: 17.1  . Smokeless tobacco: Never Used  Vaping Use  . Vaping Use: Never used  Substance and Sexual Activity  . Alcohol use: Yes    Alcohol/week: 0.0 standard drinks    Comment: social  . Drug use: No  . Sexual activity: Yes    Birth control/protection: Post-menopausal  Other Topics Concern  . Not on file  Social History Narrative   Married.   1 child son died 12-24-2018 due to Suicide, 6 grandchildren   2nd son died 2003/12/24 MVA   Works for the [employer].   Enjoys reading and exercising.    Has 2 dogs and 2 cats   Social Determinants of Corporate investment banker Strain: Not on file  Food Insecurity: Not on file  Transportation Needs: Not on file  Physical Activity: Not on file  Stress:  Not on file  Social Connections: Not on file  Intimate Partner Violence: Not on file   Current Meds  Medication Sig  . diclofenac sodium (VOLTAREN) 1 % GEL Apply topically 4 (four) times daily.  [name] gabapentin (NEURONTIN) 400 MG capsule Take 1 capsule (400 mg total) by mouth at bedtime.  . lamoTRIgine (LAMICTAL) 25 MG tablet Take 1 tablet (25 mg total) by mouth daily. Dr. [name]  . ondansetron (ZOFRAN) 4 MG tablet Take 1 tablet (4 mg total) by mouth every 8 (eight) hours as needed for nausea or vomiting.  [name] oxyCODONE-acetaminophen (PERCOCET) 5-325 MG tablet Take 1 tablet by mouth every 8 (eight) hours as needed for severe pain.  . pantoprazole (PROTONIX) 40 MG tablet Take 1 tablet (40 mg total) by mouth daily. 30 min before breakfast OR dinner  . QUEtiapine (SEROQUEL) 100 MG tablet Take 100 mg by mouth at bedtime.  . rizatriptan (MAXALT) 10 MG tablet Take 1 tablet (10 mg total) by mouth as needed for migraine. May repeat in 2 hours if needed  . sertraline (ZOLOFT) 100 MG tablet Take 2 tablets (200 mg total) by mouth every morning. Dr. [name]  . tiZANidine (ZANAFLEX) 4 MG tablet Take 1 tablet (4 mg total) by mouth every 8 (eight) hours as needed for muscle spasms.  . traMADol (ULTRAM-ER) 100 MG 24 hr tablet Take 1 tablet (100 mg total) by mouth daily.  . traZODone (DESYREL) 100 MG tablet Take 100 mg by mouth at bedtime.   Allergies  Allergen Reactions  . Shellfish Allergy Swelling    REACTION: throat closing   No results found for this or any previous visit (from the past 2158/12/24 hour(s)). Objective  Body mass index is 28.34 kg/m. Wt Readings from Last 3 Encounters:  11/02/20 160 lb (72.6 kg)  09/21/20 162 lb (73.5 kg)  09/04/20 162 lb (73.5 kg)   Temp Readings from Last 3 Encounters:  11/02/20 97.8 F (36.6 C) (Oral)  09/04/20 (!) 97.2 F (36.2 C) (Temporal)  06/07/20 (!) 97.5 F (36.4 C)   BP Readings from Last 3 Encounters:  11/02/20 124/80  09/04/20 119/83  06/07/20 118/76    Pulse Readings from Last 3 Encounters:  11/02/20 80  09/04/20 89  06/07/20 79    Physical Exam Vitals and nursing note reviewed.  Constitutional:      Appearance: Normal appearance. She is well-developed, well-groomed and overweight.  HENT:     Head: Normocephalic and atraumatic.  Eyes:     Conjunctiva/sclera: Conjunctivae normal.     Pupils: Pupils are equal, round, and reactive to light.  Cardiovascular:     Rate and  Rhythm: Normal rate and regular rhythm.     Heart sounds: Normal heart sounds.  Pulmonary:     Effort: Pulmonary effort is normal.     Breath sounds: Normal breath sounds.  Chest:     Chest wall: Tenderness present.  Breasts:     Right: Normal.     Left: Normal.     Abdominal:     Tenderness: There is no abdominal tenderness.  Musculoskeletal:       Arms:  Skin:    General: Skin is warm and moist.  Neurological:     General: No focal deficit present.     Mental Status: She is alert and oriented to person, place, and time.     Gait: Gait normal.  Psychiatric:        Attention and Perception: Attention and perception normal.        Mood and Affect: Mood and affect normal.        Speech: Speech normal.        Behavior: Behavior normal. Behavior is cooperative.        Thought Content: Thought content normal.        Cognition and Memory: Cognition and memory normal.        Judgment: Judgment normal.     Assessment  Plan  Atypical Chest pain, (anxiety controlled, h/o GERD/HH not on prilosec, r/o MI vs PE) it is reproducible could be MSK - Plan: Troponin I (High Sensitivity), D-Dimer, Quantitative, EKG 12-Lead NSR today  Can try voltaren gel to chest to see if helps  Consider cards if not improved   Brain fog - Plan: Ambulatory referral to Neurology Nausea - Plan: ondansetron (ZOFRAN) 4 MG tablet, Ambulatory referral to Neurology Intractable episodic headache with h/o migraines - Plan: ondansetron (ZOFRAN) 4 MG tablet, rizatriptan (MAXALT) 10 MG  tablet, Ambulatory referral to Neurology  History of migraine - Plan: ondansetron (ZOFRAN) 4 MG tablet, rizatriptan (MAXALT) 10 MG tablet, Ambulatory referral to Neurology Disc magnesium 250 mg qd   HM No flu this year  2/2 pfizer disc booster Tdap utd 04/23/11 Consider shingrix in future   Pap 08/24/18 +HPV negative pap refer to ob/GYN in 08/2019 Dr. Cherly Hensen check to see if went   abnormal mammogram bx neg necrosis and cystic change due 01/19/19 nl breast exam right axillary lymph node  -mammo 03/11/19 negative  -ordered for 2022 [place]    EGD/colonoscopy 07/19/18 ext hemorrhoids erosive gastritis bx mild gastritis   Former smoker quit 15 years ago smoked 10-13 years 1ppd   rec healthy diet and exercise   04/27/19 saw Dr. Dierdre Forth initially tx'ed psoriatic arthritis 2014 Dr. Koren Bound took MTX x 2 months w/o help hand Xray no evidence of erosions of bone no tx requreed and f/u pain clinic  Consider paxil fo cymbalta or gabapentin to lyrica which could be more beneficial per note  F/u in 6 months    08/04/2019 and 08/12/19 Dr. [name]  Anxiety/depression zoloft 100 mg qd, seroquel 100 mg qd A1C 4.6, TC 111, cont SEL therapy  Psych 08/29/19 depression/anxiety passive SI since 04/2019 no danger to self or others  Increase zoloft 150 mg qd, seroquel 100 mg qd, rec SEL therapy  09/09/19 psych appt Dr. 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 [name] f/u recurrent depression/anxiety, insomnia son committed suicide 04/2019 chronic intermittent passive SI/thoughts no active thoughts no danger  to herself phq9 score 3 mood stable cont zoloft 150 mg qd seroquel 100 mg qhs A1C 4.6 TC 111, TSH b12 folic acid normal   Provider: Dr. French Anaracy McLean-Scocuzza-[facility]

## 2020-11-04 LAB — URINALYSIS, ROUTINE W REFLEX MICROSCOPIC
Bilirubin, UA: NEGATIVE
Glucose, UA: NEGATIVE
Leukocytes,UA: NEGATIVE
Nitrite, UA: NEGATIVE
RBC, UA: NEGATIVE
Specific Gravity, UA: >=1.03 — AB (ref 1.005–1.030)
Urobilinogen, Ur: 0.2 mg/dL (ref 0.2–1.0)
pH, UA: 6 (ref 5.0–7.5)

## 2020-11-04 LAB — MICROSCOPIC EXAMINATION
Casts: NONE SEEN /lpf
RBC, Urine: NONE SEEN /hpf (ref 0–2)

## 2020-11-05 LAB — HIV ANTIBODY (ROUTINE TESTING W REFLEX): HIV 1&2 Ab, 4th Generation: NONREACTIVE

## 2020-11-05 LAB — IRON,TIBC AND FERRITIN PANEL
%SAT: 28 % (calc) (ref 16–45)
Ferritin: 15 ng/mL — ABNORMAL LOW (ref 16–232)
Iron: 108 ug/dL (ref 45–160)
TIBC: 386 mcg/dL (calc) (ref 250–450)

## 2020-11-06 DIAGNOSIS — F419 Anxiety disorder, unspecified: Secondary | ICD-10-CM | POA: Diagnosis not present

## 2020-11-06 DIAGNOSIS — F5105 Insomnia due to other mental disorder: Secondary | ICD-10-CM | POA: Diagnosis not present

## 2020-11-06 DIAGNOSIS — F4312 Post-traumatic stress disorder, chronic: Secondary | ICD-10-CM | POA: Diagnosis not present

## 2020-11-06 DIAGNOSIS — F39 Unspecified mood [affective] disorder: Secondary | ICD-10-CM | POA: Diagnosis not present

## 2020-11-13 DIAGNOSIS — F4310 Post-traumatic stress disorder, unspecified: Secondary | ICD-10-CM | POA: Diagnosis not present

## 2020-11-15 ENCOUNTER — Encounter: Payer: Self-pay | Admitting: Student in an Organized Health Care Education/Training Program

## 2020-11-19 ENCOUNTER — Other Ambulatory Visit: Payer: Federal, State, Local not specified - PPO

## 2020-11-20 ENCOUNTER — Encounter: Payer: Self-pay | Admitting: Gastroenterology

## 2020-11-20 ENCOUNTER — Ambulatory Visit: Payer: Federal, State, Local not specified - PPO | Admitting: Gastroenterology

## 2020-11-20 ENCOUNTER — Other Ambulatory Visit: Payer: Self-pay

## 2020-11-20 VITALS — BP 106/69 | HR 94 | Ht 63.0 in | Wt 164.2 lb

## 2020-11-20 DIAGNOSIS — R748 Abnormal levels of other serum enzymes: Secondary | ICD-10-CM | POA: Diagnosis not present

## 2020-11-20 MED ORDER — PREDNISONE 10 MG PO TABS: ORAL_TABLET | ORAL | 0 refills | Status: AC

## 2020-11-20 NOTE — Progress Notes (Signed)
Primary Care Physician: McLean-Scocuzza, Pasty Spillers, MD  Primary Gastroenterologist:  Dr. Midge Minium  Chief Complaint  Patient presents with  . Elevated Hepatic Enzymes    HPI: Barbara Faulkner is a 55 y.o. female here for follow-up of abnormal liver enzymes.  The patient had been found to have abnormal liver enzymes with normal GGT and was supposed to have her labs drawn last visit to fractionate the alkaline phosphatase to see if it was from a bone source or a liver source.  It does not appear that those labs were done at that time although the order is in the computer.  The patient is on multiple medications and she states that some of her medications were changed to see if her liver enzymes would improve but she is not sure which ones were changing which ones were capped.  The patient had a liver biopsy that showed:  DIAGNOSIS:  A. LIVER; ULTRASOUND-GUIDED CORE BIOPSY:  - MILD AND PATCHY PORTAL TRACT LYMPHOCYTIC INFILTRATES AND PASD-POSITIVE  MACROPHAGES, SEE COMMENT.  - MINIMAL STEATOSIS, <1%.  - NEGATIVE FOR FIBROSIS.   The patient's liver enzymes have continued to remain elevated.  Most recently they have shown:  Component     Latest Ref Rng & Units 07/01/2019 08/10/2019 11/02/2020  Total Bilirubin     0.2 - 1.2 mg/dL 0.7 0.8 0.6  Alkaline Phosphatase     39 - 117 U/L 148 (H) 173 (H) 257 (H)  AST     0 - 37 U/L 44 (H) 54 (H) 52 (H)  ALT     0 - 35 U/L 46 (H) 59 (H) 55 (H)      Past Medical History:  Diagnosis Date  . Allergy   . Anxiety   . Chronic pain   . Chronic sinusitis   . COVID-19    09/14/20  . Depression   . Heart murmur   . Hiatal hernia   . HPV (human papilloma virus) anogenital infection   . Macromastia   . Migraine   . Psoriatic arthritis (H[facility])     Current Outpatient Medications  Medication Sig Dispense Refill  . Cholecalciferol 1.25 MG (50000 UT) capsule Take 1 capsule (50,000 Units total) by mouth once a week. X 6 months 13 capsule 1  .  diclofenac sodium (VOLTAREN) 1 % GEL Apply topically 4 (four) times daily.    [name] gabapentin (NEURONTIN) 400 MG capsule Take 1 capsule (400 mg total) by mouth at bedtime. 30 capsule 2  . lamoTRIgine (LAMICTAL) 25 MG tablet Take 1 tablet (25 mg total) by mouth daily. Dr. [name]    . oxyCODONE-acetaminophen (PERCOCET) 5-325 MG tablet Take 1 tablet by mouth every 8 (eight) hours as needed for severe pain. 90 tablet 0  . pantoprazole (PROTONIX) 40 MG tablet Take 1 tablet (40 mg total) by mouth daily. 30 min before breakfast OR dinner 90 tablet 3  . predniSONE (DELTASONE) 10 MG tablet Take 4 tablets (40 mg total) by mouth daily with breakfast for 14 days, THEN 3 tablets (30 mg total) daily with breakfast for 7 days. 77 tablet 0  . QUEtiapine (SEROQUEL) 100 MG tablet Take 100 mg by mouth at bedtime.    . rizatriptan (MAXALT) 10 MG tablet Take 1 tablet (10 mg total) by mouth as needed for migraine. May repeat in 2 hours if needed 10 tablet 5  . sertraline (ZOLOFT) 100 MG tablet Take 2 tablets (200 mg total) by mouth every morning. Dr. [name]    .  tiZANidine (ZANAFLEX) 4 MG tablet Take 1 tablet (4 mg total) by mouth every 8 (eight) hours as needed for muscle spasms. 90 tablet 2  . traMADol (ULTRAM-ER) 100 MG 24 hr tablet Take 1 tablet (100 mg total) by mouth daily. 30 tablet 2  . traZODone (DESYREL) 100 MG tablet Take 100 mg by mouth at bedtime.    [name] albuterol (VENTOLIN HFA) 108 (90 Base) MCG/ACT inhaler INHALE 1 TO 2 PUFFS INTO THE LUNGS EVERY 6 HOURS AS NEEDED FOR WHEEZING OR SHORTNESS OF BREATH OR CHEST TIGHTNESS (Patient not taking: No sig reported) 54 g 1  . fluticasone (FLONASE) 50 MCG/ACT nasal spray Place 2 sprays into both nostrils daily. Prn (Patient not taking: No sig reported) 16 g 6   No current facility-administered medications for this visit.    Allergies as of 11/20/2020 - Review Complete 11/20/2020  Allergen Reaction Noted  . Shellfish allergy Swelling 09/27/2010    ROS:  General:  Negative for anorexia, weight loss, fever, chills, fatigue, weakness. ENT: Negative for hoarseness, difficulty swallowing , nasal congestion. CV: Negative for chest pain, angina, palpitations, dyspnea on exertion, peripheral edema.  Respiratory: Negative for dyspnea at rest, dyspnea on exertion, cough, sputum, wheezing.  GI: See history of present illness. GU:  Negative for dysuria, hematuria, urinary incontinence, urinary frequency, nocturnal urination.  Endo: Negative for unusual weight change.    Physical Examination:   BP 106/69   Pulse 94   Ht 5\' 3"  (1.6 m)   Wt 164 lb 3.2 oz (74.5 kg)   LMP 05/04/2014   BMI 29.09 kg/m   General: Well-nourished, well-developed in no acute distress.  Eyes: No icterus. Conjunctivae pink. Lungs: Clear to auscultation bilaterally. Non-labored. Heart: Regular rate and rhythm, no murmurs rubs or gallops.  Abdomen: Bowel sounds are normal, nontender, nondistended, no hepatosplenomegaly or masses, no abdominal bruits or hernia , no rebound or guarding.   Extremities: No lower extremity edema. No clubbing or deformities. Neuro: Alert and oriented x 3.  Grossly intact. Skin: Warm and dry, no jaundice.   Psych: Alert and cooperative, normal mood and affect.  Labs:    Imaging Studies: No results found.  Assessment and Plan:   Barbara Faulkner is a 55 y.o. y/o female who comes in today with a history of chronic elevation of her liver enzymes back to 2019.  The patient's liver biopsy was described above.  The patient continues to be asymptomatic and had no sign of any advanced liver disease on the biopsy but did show lymphocytic infiltrates.  The patient will have the lab sent off that she did not have previously done including the fractionation of the alkaline phosphatase and her PTH and calcium.  She will also be started on prednisone to see if this may be autoimmune hepatitis and if it responds to steroids.  The patient will be given 40 mg of prednisone  for 2 weeks and then decrease to 30 mg whereupon she will have her liver enzymes tested again at that time.  The patient has been explained the plan agrees with it.     2020, MD. Midge Minium    Note: This dictation was prepared with Dragon dictation along with smaller phrase technology. Any transcriptional errors that result from this process are unintentional.

## 2020-11-22 ENCOUNTER — Encounter: Payer: Self-pay | Admitting: Internal Medicine

## 2020-11-22 ENCOUNTER — Ambulatory Visit: Payer: Federal, State, Local not specified - PPO | Admitting: Internal Medicine

## 2020-11-22 ENCOUNTER — Other Ambulatory Visit: Payer: Self-pay

## 2020-11-22 VITALS — BP 130/86 | HR 98 | Temp 97.7°F | Ht 63.0 in | Wt 164.5 lb

## 2020-11-22 DIAGNOSIS — K760 Fatty (change of) liver, not elsewhere classified: Secondary | ICD-10-CM

## 2020-11-22 DIAGNOSIS — N2581 Secondary hyperparathyroidism of renal origin: Secondary | ICD-10-CM

## 2020-11-22 DIAGNOSIS — R748 Abnormal levels of other serum enzymes: Secondary | ICD-10-CM | POA: Diagnosis not present

## 2020-11-22 DIAGNOSIS — N179 Acute kidney failure, unspecified: Secondary | ICD-10-CM | POA: Diagnosis not present

## 2020-11-22 NOTE — Progress Notes (Signed)
Chief Complaint  Patient presents with  . Follow-up    Lab f/u   F/u  1.elevated lfts and creatinine denies etoh use staying hydrated with water reviewed medication list gabapentin can cause elevated lfts seroquel can cause elevated lfts, zoloft elevated creatinine, zanaflex elevated lfts, trazadone can be liver toxic Prior liver bx with mild steatosis pathy portal lymphocytic infiltrates PAS D + negative fibrosis  Elevated alkaline phos as well GI Dr. Servando Snare is treating with steroids but she has not started these yet and plans to start 11/25/20  Results for Barbara Faulkner, Barbara Faulkner (MRN [id number]) as of 11/27/2020 13:14  11/02/2020 14:07 BUN: 24 (H) Creatinine: 1.31 (H) Calcium: 7.9 (L) Alkaline Phosphatase: 257 (H) Albumin: 4.1 AST: 52 (H) ALT: 55 (H) Total Protein: 6.2 Total Bilirubin: 0.6 GFR: 46.88 (L)  11/02/2020 15:29  11/20/2020 14:35 Calcium: 8.8 Alkaline Phosphatase: 299 (H)  11/22/2020 14:52 BUN: 24 (H) Creatinine: 1.35 (H) Calcium: 8.4 Alkaline Phosphatase: 259 (H) Albumin: 4.2 AST: 45 (H) ALT: 48 (H) Total Protein: 6.2 Total Bilirubin: 0.5 GGT: 29 GFR: 45.20 (L)  11/22/2020 14:59 Alkaline Phosphatase: 299 (H)  2. H/a most recently she thinks was due to withdrawal of seroquel she stopped this 100 mg qhs x few days to weeks but once restarted h/a resolved   3. Elevated PTH and normal calcium Results for Barbara Faulkner (MRN [id number]) as of 11/27/2020 13:15  11/20/2020 14:35 PTH, Intact: 74 (H)   Review of Systems  Constitutional: Negative for weight loss.  HENT: Negative for hearing loss.   Eyes: Negative for blurred vision.  Respiratory: Negative for shortness of breath.   Cardiovascular: Negative for chest pain.  Gastrointestinal: Negative for abdominal pain.  Musculoskeletal: Positive for back pain and joint pain.  Skin: Negative for rash.  Neurological: Negative for headaches.  Psychiatric/Behavioral: Negative for depression.   Past Medical History:   Diagnosis Date  . Allergy   . Anxiety   . Chronic pain   . Chronic sinusitis   . COVID-19    09/14/20  . Depression   . Heart murmur   . Hiatal hernia   . HPV (human papilloma virus) anogenital infection   . Macromastia   . Migraine   . Psoriatic arthritis [facility])    Past Surgical History:  Procedure Laterality Date  . BREAST BIOPSY Right 01/28/2018   US guided biopsy - heart shaped  . BREAST REDUCTION SURGERY Bilateral 04/03/2020   Procedure: MAMMARY REDUCTION  (BREAST);  Surgeon: Contogiannis, Chales Abrahams, MD;  Location: [facility];  Service: Plastics;  Laterality: Bilateral;  HAVE LIPOSUCTION MACHINE AVAILABLE  . CHOLECYSTECTOMY  2009  . COLONOSCOPY WITH PROPOFOL N/A 07/19/2018   Procedure: COLONOSCOPY WITH PROPOFOL;  Surgeon: Toney Reil, MD;  Location: [facility] ENDOSCOPY;  Service: Gastroenterology;  Laterality: N/A;  . ESOPHAGOGASTRODUODENOSCOPY (EGD) WITH PROPOFOL N/A 07/19/2018   Procedure: ESOPHAGOGASTRODUODENOSCOPY (EGD) WITH PROPOFOL;  Surgeon: Toney Reil, MD;  Location: [facility] ENDOSCOPY;  Service: Gastroenterology;  Laterality: N/A;  . GASTRIC BYPASS     2015; duodenal switch   . LAPAROSCOPIC GASTRIC BANDING  2008    removed 2009  . TUBAL LIGATION  1997   Family History  Problem Relation Age of Onset  . HIV Father   . Hypertension Mother   . Hyperlipidemia Mother   . Lung cancer Maternal Grandfather        smoker  . Hepatitis Maternal Uncle        drug use  . Colon cancer Neg Hx   .  Colon polyps Neg Hx   . Rectal cancer Neg Hx   . Stomach cancer Neg Hx   . Breast cancer Neg Hx    Social History   Socioeconomic History  . Marital status: Married    Spouse name: Not on file  . Number of children: 2  . Years of education: Not on file  . Highest education level: Not on file  Occupational History  . Occupation: Forensic scientist, Publishing rights manager: [employer]  Tobacco Use  . Smoking status: Former Smoker    Packs/day: 2.00     Years: 20.00    Pack years: 40.00    Types: Cigarettes    Quit date: 09/02/2003    Years since quitting: 17.2  . Smokeless tobacco: Never Used  Vaping Use  . Vaping Use: Never used  Substance and Sexual Activity  . Alcohol use: Yes    Alcohol/week: 0.0 standard drinks    Comment: social  . Drug use: No  . Sexual activity: Yes    Birth control/protection: Post-menopausal  Other Topics Concern  . Not on file  Social History Narrative   Married.   1 child son died 01-09-19 due to Suicide, 6 grandchildren   2nd son died 2004-01-09 MVA   Works for the [employer].   Enjoys reading and exercising.    Has 2 dogs and 2 cats   Social Determinants of Corporate investment banker Strain: Not on file  Food Insecurity: Not on file  Transportation Needs: Not on file  Physical Activity: Not on file  Stress: Not on file  Social Connections: Not on file  Intimate Partner Violence: Not on file   Current Meds  Medication Sig  . albuterol (VENTOLIN HFA) 108 (90 Base) MCG/ACT inhaler INHALE 1 TO 2 PUFFS INTO THE LUNGS EVERY 6 HOURS AS NEEDED FOR WHEEZING OR SHORTNESS OF BREATH OR CHEST TIGHTNESS  . Cholecalciferol 1.25 MG (50000 UT) capsule Take 1 capsule (50,000 Units total) by mouth once a week. X 6 months  . diclofenac sodium (VOLTAREN) 1 % GEL Apply topically 4 (four) times daily.  . fluticasone (FLONASE) 50 MCG/ACT nasal spray Place 2 sprays into both nostrils daily. Prn  . gabapentin (NEURONTIN) 400 MG capsule Take 1 capsule (400 mg total) by mouth at bedtime.  . lamoTRIgine (LAMICTAL) 25 MG tablet Take 1 tablet (25 mg total) by mouth daily. Dr. [name]  . oxyCODONE-acetaminophen (PERCOCET) 5-325 MG tablet Take 1 tablet by mouth every 8 (eight) hours as needed for severe pain.  . pantoprazole (PROTONIX) 40 MG tablet Take 1 tablet (40 mg total) by mouth daily. 30 min before breakfast OR dinner  . predniSONE (DELTASONE) 10 MG tablet Take 4 tablets (40 mg total) by mouth daily with breakfast for 14  days, THEN 3 tablets (30 mg total) daily with breakfast for 7 days.  [name] QUEtiapine (SEROQUEL) 100 MG tablet Take 100 mg by mouth at bedtime.  . rizatriptan (MAXALT) 10 MG tablet Take 1 tablet (10 mg total) by mouth as needed for migraine. May repeat in 2 hours if needed  . sertraline (ZOLOFT) 100 MG tablet Take 2 tablets (200 mg total) by mouth every morning. Dr. [name]  . tiZANidine (ZANAFLEX) 4 MG tablet Take 1 tablet (4 mg total) by mouth every 8 (eight) hours as needed for muscle spasms.  . traMADol (ULTRAM-ER) 100 MG 24 hr tablet Take 1 tablet (100 mg total) by mouth daily.  . traZODone (DESYREL) 100 MG tablet  Take 100 mg by mouth at bedtime.   Allergies  Allergen Reactions  . Shellfish Allergy Swelling    REACTION: throat closing   Recent Results (from the past 2160 hour(s))  Troponin I (High Sensitivity)     Status: None   Collection Time: 11/02/20  2:03 PM  Result Value Ref Range   High Sens Troponin I 3 2 - 17 ng/L  D-Dimer, Quantitative     Status: None   Collection Time: 11/02/20  2:05 PM  Result Value Ref Range   D-Dimer, Quant <0.19 <0.50 mcg/mL FEU    Comment: . The D-Dimer test is used frequently to exclude an acute PE or DVT. In patients with a low to moderate clinical risk assessment and a D-Dimer result <0.50 mcg/mL FEU, the likelihood of a PE or DVT is very low. However, a thromboembolic event should not be excluded solely on the basis of the D-Dimer level. Increased levels of D-Dimer are associated with a PE, DVT, DIC, malignancies, inflammation, sepsis, surgery, trauma, pregnancy, and advancing patient age. [Jama 2006 11:295(2):199-207] . For additional information, please refer to: http://education.questdiagn[facility].com/faq/FAQ149 (This link is being provided for informational/ educational purposes only) .   Comprehensive metabolic panel     Status: Abnormal   Collection Time: 11/02/20  2:07 PM  Result Value Ref Range   Sodium 144 135 - 145 mEq/L    Potassium 3.5 3.5 - 5.1 mEq/L   Chloride 112 96 - 112 mEq/L   CO2 23 19 - 32 mEq/L   Glucose, Bld 93 70 - 99 mg/dL   BUN 24 (H) 6 - 23 mg/dL   Creatinine, Ser 1.611.31 (H) 0.40 - 1.20 mg/dL   Total Bilirubin 0.6 0.2 - 1.2 mg/dL   Alkaline Phosphatase 257 (H) 39 - 117 U/L   AST 52 (H) 0 - 37 U/L   ALT 55 (H) 0 - 35 U/L   Total Protein 6.2 6.0 - 8.3 g/dL   Albumin 4.1 3.5 - 5.2 g/dL   GFR 09.6046.88 (L) >45.40>60.00 mL/min    Comment: Calculated using the CKD-EPI Creatinine Equation (2021)   Calcium 7.9 (L) 8.4 - 10.5 mg/dL  TSH     Status: None   Collection Time: 11/02/20  2:07 PM  Result Value Ref Range   TSH 0.77 0.35 - 4.50 uIU/mL  Urinalysis, Routine w reflex microscopic     Status: Abnormal   Collection Time: 11/02/20  2:07 PM  Result Value Ref Range   Specific Gravity, UA      >=1.030 (A) 1.005 - 1.030   pH, UA 6.0 5.0 - 7.5   Color, UA Yellow Yellow   Appearance Ur Cloudy (A) Clear   Leukocytes,UA Negative Negative   Protein,UA 1+ (A) Negative/Trace   Glucose, UA Negative Negative   Ketones, UA Trace (A) Negative   RBC, UA Negative Negative   Bilirubin, UA Negative Negative   Urobilinogen, Ur 0.2 0.2 - 1.0 mg/dL   Nitrite, UA Negative Negative   Microscopic Examination See below:     Comment: Microscopic was indicated and was performed.  Iron, TIBC and Ferritin Panel     Status: Abnormal   Collection Time: 11/02/20  2:07 PM  Result Value Ref Range   Iron 108 45 - 160 mcg/dL   TIBC 981386 191250 - 478450 mcg/dL (calc)   %SAT 28 16 - 45 % (calc)   Ferritin 15 (L) 16 - 232 ng/mL  Vitamin D (25 hydroxy)     Status: Abnormal   Collection  Time: 11/02/20  2:07 PM  Result Value Ref Range   VITD 11.89 (L) 30.00 - 100.00 ng/mL  HIV antibody (with reflex)     Status: None   Collection Time: 11/02/20  2:07 PM  Result Value Ref Range   HIV 1&2 Ab, 4th Generation NON-REACTIVE NON-REACTI    Comment: HIV-1 antigen and HIV-1/HIV-2 antibodies were not detected. There is no laboratory evidence of  HIV infection. [name] PLEASE NOTE: This information has been disclosed to you from records whose confidentiality may be protected by state law.  If your state requires such protection, then the state law prohibits you from making any further disclosure of the information without the specific written consent of the person to whom it pertains, or as otherwise permitted by law. A general authorization for the release of medical or other information is NOT sufficient for this purpose. . For additional information please refer to http://education.questdiagn[facility].com/faq/FAQ106 (This link is being provided for informational/ educational purposes only.) . [name] The performance of this assay has not been clinically validated in patients less than 58 years old. [name]   CBC with Differential/Platelet     Status: Abnormal   Collection Time: 11/02/20  2:07 PM  Result Value Ref Range   WBC 9.4 4.0 - 10.5 K/uL   RBC 4.36 3.87 - 5.11 Mil/uL   Hemoglobin 11.8 (L) 12.0 - 15.0 g/dL   HCT 32.4 40.1 - 02.7 %   MCV 84.2 78.0 - 100.0 fl   MCHC 32.2 30.0 - 36.0 g/dL   RDW 25.3 66.4 - 40.3 %   Platelets 168.0 150.0 - 400.0 K/uL   Neutrophils Relative % 66.8 43.0 - 77.0 %   Lymphocytes Relative 26.0 12.0 - 46.0 %   Monocytes Relative 5.8 3.0 - 12.0 %   Eosinophils Relative 1.2 0.0 - 5.0 %   Basophils Relative 0.2 0.0 - 3.0 %   Neutro Abs 6.3 1.4 - 7.7 K/uL   Lymphs Abs 2.4 0.7 - 4.0 K/uL   Monocytes Absolute 0.5 0.1 - 1.0 K/uL   Eosinophils Absolute 0.1 0.0 - 0.7 K/uL   Basophils Absolute 0.0 0.0 - 0.1 K/uL  Microscopic Examination     Status: Abnormal   Collection Time: 11/02/20  2:07 PM   BLD  Result Value Ref Range   WBC, UA 0-5 0 - 5 /hpf   RBC None seen 0 - 2 /hpf   Epithelial Cells (non renal) 0-10 0 - 10 /hpf   Casts None seen None seen /lpf   Crystals Present (A) N/A   Crystal Type Amorphous Sediment N/A   Bacteria, UA Many (A) None seen/Few  Lipid panel     Status: None   Collection Time:  11/02/20  3:29 PM  Result Value Ref Range   Cholesterol 106 0 - 200 mg/dL    Comment: ATP III Classification       Desirable:  < 200 mg/dL               Borderline High:  200 - 239 mg/dL          High:  > = 474 mg/dL   Triglycerides 25.9 0.0 - 149.0 mg/dL    Comment: Normal:  <563 mg/dLBorderline High:  150 - 199 mg/dL   HDL 87.56 >43.32 mg/dL   VLDL 95.1 0.0 - 88.4 mg/dL   LDL Cholesterol 53 0 - 99 mg/dL   Total CHOL/HDL Ratio 3     Comment:  Men          Women1/2 Average Risk     3.4          3.3Average Risk          5.0          4.42X Average Risk          9.6          7.13X Average Risk          15.0          11.0                       NonHDL 64.37     Comment: NOTE:  Non-HDL goal should be 30 mg/dL higher than patient's LDL goal (i.e. LDL goal of < 70 mg/dL, would have non-HDL goal of < 100 mg/dL)  Alkaline Phosphatase, Isoenzymes     Status: Abnormal   Collection Time: 11/20/20  2:35 PM  Result Value Ref Range   Alkaline Phosphatase 299 (H) 44 - 121 IU/L   LIVER FRACTION 30 18 - 85 %   BONE FRACTION 62 14 - 68 %   INTESTINAL FRAC. 8 0 - 18 %  PTH, Intact and Calcium     Status: Abnormal   Collection Time: 11/20/20  2:35 PM  Result Value Ref Range   Calcium 8.8 8.7 - 10.2 mg/dL   PTH 74 (H) 15 - 65 pg/mL   PTH Interp Comment     Comment: Interpretation                 Intact PTH    Calcium                                 (pg/mL)      (mg/dL) Normal                          15 - 65     8.6 - 10.2 Primary Hyperparathyroidism         >65          >10.2 Secondary Hyperparathyroidism       >65          <10.2 Non-Parathyroid Hypercalcemia       <65          >10.2 Hypoparathyroidism                  <15          < 8.6 Non-Parathyroid Hypocalcemia    15 - 65          < 8.6   Microalbumin / creatinine urine ratio     Status: None   Collection Time: 11/22/20  2:52 PM  Result Value Ref Range   Creatinine, Urine 183.8 Not Estab. mg/dL   Microalbumin, Urine 8.6 Not Estab.  ug/mL   Microalb/Creat Ratio 5 0 - 29 mg/g creat    Comment:                        Normal:                0 -  29                        Moderately increased: 30 - 300  Severely increased:       >300   Sodium, urine, random     Status: None   Collection Time: 11/22/20  2:52 PM  Result Value Ref Range   Sodium, Ur 122 Not Estab. mmol/L  Comprehensive metabolic panel     Status: Abnormal   Collection Time: 11/22/20  2:52 PM  Result Value Ref Range   Sodium 140 135 - 145 mEq/L   Potassium 4.2 3.5 - 5.1 mEq/L   Chloride 112 96 - 112 mEq/L   CO2 20 19 - 32 mEq/L   Glucose, Bld 92 70 - 99 mg/dL   BUN 24 (H) 6 - 23 mg/dL   Creatinine, Ser 0.98 (H) 0.40 - 1.20 mg/dL   Total Bilirubin 0.5 0.2 - 1.2 mg/dL   Alkaline Phosphatase 259 (H) 39 - 117 U/L   AST 45 (H) 0 - 37 U/L   ALT 48 (H) 0 - 35 U/L   Total Protein 6.2 6.0 - 8.3 g/dL   Albumin 4.2 3.5 - 5.2 g/dL   GFR 11.91 (L) >47.82 mL/min    Comment: Calculated using the CKD-EPI Creatinine Equation (2021)   Calcium 8.4 8.4 - 10.5 mg/dL  Gamma GT     Status: None   Collection Time: 11/22/20  2:52 PM  Result Value Ref Range   GGT 29 7 - 51 U/L  Alkaline phosphatase, isoenzymes     Status: Abnormal   Collection Time: 11/22/20  2:59 PM  Result Value Ref Range   Alkaline Phosphatase 299 (H) 44 - 121 IU/L   LIVER FRACTION 32 18 - 85 %   BONE FRACTION 59 14 - 68 %   INTESTINAL FRAC. 9 0 - 18 %   Objective  Body mass index is 29.14 kg/m. Wt Readings from Last 3 Encounters:  11/22/20 164 lb 8 oz (74.6 kg)  11/20/20 164 lb 3.2 oz (74.5 kg)  11/02/20 160 lb (72.6 kg)   Temp Readings from Last 3 Encounters:  11/22/20 97.7 F (36.5 C)  11/02/20 97.8 F (36.6 C) (Oral)  09/04/20 (!) 97.2 F (36.2 C) (Temporal)   BP Readings from Last 3 Encounters:  11/22/20 130/86  11/20/20 106/69  11/02/20 124/80   Pulse Readings from Last 3 Encounters:  11/22/20 98  11/20/20 94  11/02/20 80    Physical Exam Vitals  and nursing note reviewed.  Constitutional:      Appearance: Normal appearance. She is well-developed and well-groomed.  HENT:     Head: Normocephalic and atraumatic.  Eyes:     Conjunctiva/sclera: Conjunctivae normal.     Pupils: Pupils are equal, round, and reactive to light.  Cardiovascular:     Rate and Rhythm: Normal rate and regular rhythm.     Heart sounds: Normal heart sounds. No murmur heard.   Pulmonary:     Effort: Pulmonary effort is normal.     Breath sounds: Normal breath sounds.  Abdominal:     General: Abdomen is flat. Bowel sounds are normal.     Tenderness: There is no abdominal tenderness.  Skin:    General: Skin is warm and dry.  Neurological:     General: No focal deficit present.     Mental Status: She is alert and oriented to person, place, and time. Mental status is at baseline.     Gait: Gait normal.  Psychiatric:        Attention and Perception: Attention and perception normal.        Mood and Affect:  Mood and affect normal.        Speech: Speech normal.        Behavior: Behavior normal. Behavior is cooperative.        Thought Content: Thought content normal.        Cognition and Memory: Cognition and memory normal.        Judgment: Judgment normal.     Assessment  Plan  Elevated liver enzymes Etiology I am wondering if due to medications she is on see HPI  gabapentin can cause elevated lfts seroquel can cause elevated lfts, zoloft elevated creatinine, zanaflex elevated lfts, trazadone can be liver toxic I am wondering with specialty if medications can be changed or simplified given above    - Plan: Comprehensive metabolic panel, Gamma GT, Alkaline phosphatase, isoenzymes  F/u GI  Liver bx in the past mild steatosis   AKI (acute kidney injury) (HCC) - Plan: Microalbumin / creatinine urine ratio, Sodium, urine, random, Comprehensive metabolic panel Secondary hyperparathyroidism (HCC) -consider renal consult in the future   HM No flu this  year  2/2 pfizer disc booster Tdap utd8/22/12 Consider shingrix, twinrix in future mild hepatic steatosis  Pap 08/24/18 +HPV negative pap refer to ob/GYN in 08/2019 Dr. Cherly Hensen check to see if went   abnormal mammogram bx neg necrosis and cystic change due 01/19/19 nl breast exam right axillary lymph node  -mammo 03/11/19 negative -ordered for 2022 [place]   EGD/colonoscopy 07/19/18 ext hemorrhoids erosive gastritis bx mild gastritis   Former smoker quit 15 years ago smoked 10-13 years 1ppd   rec healthy diet and exercise   04/27/19 saw Dr. Dierdre Forth initially tx'ed psoriatic arthritis 2014 Dr. Koren Bound took MTX x 2 months w/o help hand Xray no evidence of erosions of bone no tx requreed and f/u pain clinic  Consider paxil fo cymbalta or gabapentin to lyrica which could be more beneficial per note  F/u in 6 months    08/04/2019 and 08/12/19 Dr. [name]  Anxiety/depression zoloft 100 mg qd, seroquel 100 mg qd A1C 4.6, TC 111, cont SEL therapy  Psych 08/29/19 depression/anxiety passive SI since 04/2019 no danger to self or others  Increase zoloft 150 mg qd, seroquel 100 mg qd, rec SEL therapy  09/09/19 psych appt Dr. 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 [name] f/u recurrent depression/anxiety, insomnia son committed suicide 04/2019 chronic intermittent passive SI/thoughts no active thoughts no danger to herself phq9 score 3 mood stable cont zoloft 150 mg qd seroquel 100 mg qhs A1C 4.6 TC 111, TSH b12 folic acid normal  Provider: Dr. French Ana McLean-Scocuz[facility]

## 2020-11-23 ENCOUNTER — Telehealth: Payer: Self-pay | Admitting: Internal Medicine

## 2020-11-23 LAB — COMPREHENSIVE METABOLIC PANEL
ALT: 48 U/L — ABNORMAL HIGH (ref 0–35)
AST: 45 U/L — ABNORMAL HIGH (ref 0–37)
Albumin: 4.2 g/dL (ref 3.5–5.2)
Alkaline Phosphatase: 259 U/L — ABNORMAL HIGH (ref 39–117)
BUN: 24 mg/dL — ABNORMAL HIGH (ref 6–23)
CO2: 20 mEq/L (ref 19–32)
Calcium: 8.4 mg/dL (ref 8.4–10.5)
Chloride: 112 mEq/L (ref 96–112)
Creatinine, Ser: 1.35 mg/dL — ABNORMAL HIGH (ref 0.40–1.20)
GFR: 45.2 mL/min — ABNORMAL LOW (ref >60.00)
Glucose, Bld: 92 mg/dL (ref 70–99)
Potassium: 4.2 mEq/L (ref 3.5–5.1)
Sodium: 140 mEq/L (ref 135–145)
Total Bilirubin: 0.5 mg/dL (ref 0.2–1.2)
Total Protein: 6.2 g/dL (ref 6.0–8.3)

## 2020-11-23 LAB — PTH, INTACT AND CALCIUM
Calcium: 8.8 mg/dL (ref 8.7–10.2)
PTH: 74 pg/mL — ABNORMAL HIGH (ref 15–65)

## 2020-11-23 LAB — MICROALBUMIN / CREATININE URINE RATIO
Creatinine, Urine: 183.8 mg/dL
Microalb/Creat Ratio: 5 mg/g creat (ref 0–29)
Microalbumin, Urine: 8.6 ug/mL

## 2020-11-23 LAB — SODIUM, URINE, RANDOM: Sodium, Ur: 122 mmol/L

## 2020-11-23 LAB — GAMMA GT: GGT: 29 U/L (ref 7–51)

## 2020-11-23 LAB — ALKALINE PHOSPHATASE, ISOENZYMES
Alkaline Phosphatase: 299 IU/L — ABNORMAL HIGH (ref 44–121)
BONE FRACTION: 62 % (ref 14–68)
INTESTINAL FRAC.: 8 % (ref 0–18)
LIVER FRACTION: 30 % (ref 18–85)

## 2020-11-23 NOTE — Telephone Encounter (Signed)
Rejection Reason - Patient Declined - Patient states that she is no longer having sxs" Darrol Poke said on Nov 23, 2020 11:41 AM  [facility]

## 2020-11-26 LAB — ALKALINE PHOSPHATASE, ISOENZYMES
Alkaline Phosphatase: 299 IU/L — ABNORMAL HIGH (ref 44–121)
BONE FRACTION: 59 % (ref 14–68)
INTESTINAL FRAC.: 9 % (ref 0–18)
LIVER FRACTION: 32 % (ref 18–85)

## 2020-11-27 ENCOUNTER — Encounter: Payer: Self-pay | Admitting: Internal Medicine

## 2020-11-27 ENCOUNTER — Ambulatory Visit
Admission: RE | Admit: 2020-11-27 | Discharge: 2020-11-27 | Disposition: A | Discharge status: Home / Self Care | Payer: Federal, State, Local not specified - PPO | Source: Ambulatory Visit | Attending: Internal Medicine | Admitting: Internal Medicine

## 2020-11-27 ENCOUNTER — Other Ambulatory Visit: Payer: Self-pay

## 2020-11-27 DIAGNOSIS — K760 Fatty (change of) liver, not elsewhere classified: Secondary | ICD-10-CM | POA: Insufficient documentation

## 2020-11-27 DIAGNOSIS — Z1231 Encounter for screening mammogram for malignant neoplasm of breast: Secondary | ICD-10-CM | POA: Diagnosis not present

## 2020-11-28 NOTE — Telephone Encounter (Signed)
-----   Message from Bevelyn Buckles, MD sent at 11/26/2020  6:44 AM EDT ----- Dr. Servando Snare and I have reviewed liver enzymes  I think some of elevation of liver enzymes could be due to pain meds and psych meds as we've discussed  Also your kidney function is elevated  -please make sure you are drinking enough water as we've discussed 55-64 ounces daily   PTH is elevated this could be secondary hyperparathyroidism and the kidney doctors should weight in  I think the kidney doctors need to weigh in are you agreeable?  Could you go to [facility] or [place]?   Also if liver enzymes do not come down I will work up further with more testing

## 2020-11-29 ENCOUNTER — Encounter
Payer: Federal, State, Local not specified - PPO | Admitting: Student in an Organized Health Care Education/Training Program

## 2020-12-03 ENCOUNTER — Ambulatory Visit
Payer: Federal, State, Local not specified - PPO | Attending: Student in an Organized Health Care Education/Training Program | Admitting: Student in an Organized Health Care Education/Training Program

## 2020-12-03 ENCOUNTER — Other Ambulatory Visit: Payer: Self-pay | Admitting: Student in an Organized Health Care Education/Training Program

## 2020-12-03 ENCOUNTER — Other Ambulatory Visit: Payer: Self-pay

## 2020-12-03 ENCOUNTER — Encounter: Payer: Self-pay | Admitting: Student in an Organized Health Care Education/Training Program

## 2020-12-03 VITALS — BP 138/91 | HR 90 | Temp 97.3°F | Resp 18 | Ht 63.0 in | Wt 162.0 lb

## 2020-12-03 DIAGNOSIS — L4050 Arthropathic psoriasis, unspecified: Secondary | ICD-10-CM | POA: Diagnosis not present

## 2020-12-03 DIAGNOSIS — M25511 Pain in right shoulder: Secondary | ICD-10-CM | POA: Insufficient documentation

## 2020-12-03 DIAGNOSIS — M25551 Pain in right hip: Secondary | ICD-10-CM | POA: Diagnosis not present

## 2020-12-03 DIAGNOSIS — M25562 Pain in left knee: Secondary | ICD-10-CM | POA: Diagnosis not present

## 2020-12-03 DIAGNOSIS — A6920 Lyme disease, unspecified: Secondary | ICD-10-CM | POA: Insufficient documentation

## 2020-12-03 DIAGNOSIS — M797 Fibromyalgia: Secondary | ICD-10-CM | POA: Diagnosis not present

## 2020-12-03 DIAGNOSIS — M25512 Pain in left shoulder: Secondary | ICD-10-CM | POA: Diagnosis not present

## 2020-12-03 DIAGNOSIS — M25561 Pain in right knee: Secondary | ICD-10-CM | POA: Insufficient documentation

## 2020-12-03 DIAGNOSIS — M2550 Pain in unspecified joint: Secondary | ICD-10-CM | POA: Diagnosis not present

## 2020-12-03 DIAGNOSIS — G894 Chronic pain syndrome: Secondary | ICD-10-CM | POA: Diagnosis not present

## 2020-12-03 DIAGNOSIS — F32A Depression, unspecified: Secondary | ICD-10-CM | POA: Diagnosis not present

## 2020-12-03 DIAGNOSIS — M25552 Pain in left hip: Secondary | ICD-10-CM | POA: Diagnosis not present

## 2020-12-03 DIAGNOSIS — M19011 Primary osteoarthritis, right shoulder: Secondary | ICD-10-CM | POA: Insufficient documentation

## 2020-12-03 DIAGNOSIS — F419 Anxiety disorder, unspecified: Secondary | ICD-10-CM | POA: Insufficient documentation

## 2020-12-03 DIAGNOSIS — M7918 Myalgia, other site: Secondary | ICD-10-CM | POA: Insufficient documentation

## 2020-12-03 DIAGNOSIS — G8929 Other chronic pain: Secondary | ICD-10-CM

## 2020-12-03 MED ORDER — TRAMADOL HCL ER 100 MG PO TB24: 100.0000 mg | ORAL_TABLET | Freq: Every day | ORAL | 2 refills | Status: DC

## 2020-12-03 MED ORDER — OXYCODONE-ACETAMINOPHEN 5-325 MG PO TABS: 1.0000 | ORAL_TABLET | Freq: Three times a day (TID) | ORAL | 0 refills | Status: DC | PRN

## 2020-12-03 MED ORDER — METHOCARBAMOL 500 MG PO TABS: 500.0000 mg | ORAL_TABLET | Freq: Two times a day (BID) | ORAL | 0 refills | Status: DC | PRN

## 2020-12-03 NOTE — Progress Notes (Signed)
Safety precautions to be maintained throughout the outpatient stay will include: orient to surroundings, keep bed in low position, maintain call bell within reach at all times, provide assistance with transfer out of bed and ambulation.   Nursing Pain Medication Assessment:  Safety precautions to be maintained throughout the outpatient stay will include: orient to surroundings, keep bed in low position, maintain call bell within reach at all times, provide assistance with transfer out of bed and ambulation.  Medication Inspection Compliance: Pill count conducted under aseptic conditions, in front of the patient. Neither the pills nor the bottle was removed from the patient's sight at any time. Once count was completed pills were immediately returned to the patient in their original bottle.  Medication #1: Tramadol (Ultram) Pill/Patch Count: 6 of 30 pills remain Pill/Patch Appearance: Markings consistent with prescribed medication Bottle Appearance: Standard pharmacy container. Clearly labeled. Filled Date: 03 / 08 / 2022 Last Medication intake:  Today  Medication #2: Oxycodone/APAP Pill/Patch Count: 17 of 90 pills remain Pill/Patch Appearance: Markings consistent with prescribed medication Bottle Appearance: Standard pharmacy container. Clearly labeled. Filled Date: 01 /04 / 2022 Last Medication intake:  Burgess Estelle

## 2020-12-03 NOTE — Progress Notes (Signed)
PROVIDER NOTE: Information contained herein reflects review and annotations entered in association with encounter. Interpretation of such information and data should be left to medically-trained personnel. Information provided to patient can be located elsewhere in the medical record under "Patient Instructions". Document created using STT-dictation technology, any transcriptional errors that may result from process are unintentional.    Patient: Barbara Faulkner  Service Category: E/M  Provider: Gillis Santa, MD  DOB: 12-09-1965  DOS: 12/03/2020  Specialty: Interventional Pain Management  MRN: [id number]  Setting: Ambulatory outpatient  PCP: McLean-Scocuzza, Nino Glow, MD  Type: Established Patient    Referring Provider: Orland Mustard *  Location: Office  Delivery: Face-to-face     HPI  [name], a 55 y.o. year old female, is here today because of her Psoriatic arthritis ([place]) [L40.50]. [name] primary complain today is Hip Pain Last encounter: My last encounter with her was on 12/03/2020. Pertinent problems: 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 Pain Assessment: Severity of Chronic pain is reported as a 7 /10. Location: Hip Right/Radaites around the upper thigh area. Onset: More than a month ago. Quality: Constant ("intense"). Timing: Constant. Modifying factor(s): Pain medicine. Vitals:  height is _0  (1.6 m) and weight is 162 lb (73.5 kg). Her temperature is 97.3 F (36.3 C) (abnormal). Her blood pressure is 138/91 (abnormal) and her pulse is 90. Her respiration is 18 and oxygen saturation is 98%.   Reason for encounter: medication management.     Patient follows up today for medication management.  Since her last visit with me, she did see her primary care provider and it was noted that she had an  elevation in her LFTs.  This could be secondary to her medications although it is low on my differential.  She is on very low-dose gabapentin 400 mg nightly.  She takes tramadol 100 mg ER and oxycodone 5 mg daily as needed for breakthrough pain.  She also utilizes tizanidine on a as needed basis for muscle spasms of her neck.  Tizanidine can cause elevation of LFTs but I doubt this is the cause of her transaminitis as she takes this medication very sparingly.  Anyhow, we discussed changing to a another muscle relaxer to take on a as needed basis: Robaxin.  Patient is also endorsing left elbow pain.  This appears to be a trigger point.  Discussed left trigger point injection.  Patient would like to proceed with TPI.  Pharmacotherapy Assessment   Analgesic: transition to tramadol 100 mg ER, continue oxycodone 5 mg daily for breakthrough pain.     Monitoring: [place] PMP: PDMP not reviewed this encounter.       Pharmacotherapy: No side-effects or adverse reactions reported. Compliance: No problems identified. Effectiveness: Clinically acceptable.  Janne Napoleon, RN  12/03/2020  1:56 PM  Sign when Signing Visit Safety precautions to be maintained throughout the outpatient stay will include: orient to surroundings, keep bed in low position, maintain call bell within reach at all times, provide assistance with transfer out of bed and ambulation.   Nursing Pain Medication Assessment:  Safety precautions to be maintained throughout the outpatient stay will include: orient to surroundings, keep bed in low position, maintain call bell within reach at all times, provide assistance with transfer out of bed and ambulation.  Medication Inspection Compliance: Pill count conducted  under aseptic conditions, in front of the patient. Neither the pills nor the bottle was removed from the patient's sight at any time. Once count was completed pills were immediately returned to the patient in their original  bottle.  Medication #1: Tramadol (Ultram) Pill/Patch Count: 6 of 30 pills remain Pill/Patch Appearance: Markings consistent with prescribed medication Bottle Appearance: Standard pharmacy container. Clearly labeled. Filled Date: 03 / 08 / 2022 Last Medication intake:  Today  Medication #2: Oxycodone/APAP Pill/Patch Count: 17 of 90 pills remain Pill/Patch Appearance: Markings consistent with prescribed medication Bottle Appearance: Standard pharmacy container. Clearly labeled. Filled Date: 01 /04 / 2022 Last Medication intake:  Yesterday   UDS:  Summary  Date Value Ref Range Status  02/08/2020 Note  Final    Comment:    ==================================================================== [employer] Select 13 (MW) ==================================================================== Test                             Result       Flag       Units  Drug Present and Declared for Prescription Verification   Tramadol                       >4237        EXPECTED   ng/mg creat   O-Desmethyltramadol            >4237        EXPECTED   ng/mg creat   N-Desmethyltramadol            1276         EXPECTED   ng/mg creat    Source of tramadol is a prescription medication. O-desmethyltramadol    and N-desmethyltramadol are expected metabolites of tramadol.  ==================================================================== Test                      Result    Flag   Units      Ref Range   Creatinine              118              mg/dL      >=20 ==================================================================== Declared Medications:  The flagging and interpretation on this report are based on the  following declared medications.  Unexpected results may arise from  inaccuracies in the declared medications.   **Note: The testing scope of this panel includes these medications:   Tramadol (Ultram)   **Note: The testing scope of this panel does not include the  following reported medications:    Fluticasone (Flonase)  Gabapentin (Neurontin)  Iron  Magnesium (Mag-Ox)  Multivitamin  Omeprazole (Prilosec)  Quetiapine (Seroquel)  Sertraline (Zoloft)  Tizanidine (Zanaflex)  Topical Diclofenac ==================================================================== For clinical consultation, please call [phone]. ====================================================================      ROS  Constitutional: Denies any fever or chills Gastrointestinal: No reported hemesis, hematochezia, vomiting, or acute GI distress Musculoskeletal: Left elbow pain, bilateral hip pain Neurological: No reported episodes of acute onset apraxia, aphasia, dysarthria, agnosia, amnesia, paralysis, loss of coordination, or loss of consciousness  Medication Review  Cholecalciferol, QUEtiapine, albuterol, diclofenac sodium, fluticasone, gabapentin, lamoTRIgine, methocarbamol, oxyCODONE-acetaminophen, pantoprazole, predniSONE, rizatriptan, sertraline, traMADol, and traZODone  History Review  Allergy: [name] is allergic to shellfish allergy. Drug: [name]  reports no history of drug use. Alcohol:  reports current alcohol use. Tobacco:  reports that she quit smoking about 17 years ago. Her smoking use  included cigarettes. She has a 40.00 pack-year smoking history. She has never used smokeless tobacco. Social: [name]  reports that she quit smoking about 17 years ago. Her smoking use included cigarettes. She has a 40.00 pack-year smoking history. She has never used smokeless tobacco. She reports current alcohol use. She reports that she does not use drugs. Medical:  has a past medical history of Allergy, Anxiety, Chronic pain, Chronic sinusitis, COVID-19, Depression, Heart murmur, Hiatal hernia, HPV (human papilloma virus) anogenital infection, Macromastia, Migraine, and Psoriatic arthritis ([place]). Surgical: [name]  has a past surgical history that includes Cholecystectomy (2009); Tubal  ligation (1997); Laparoscopic gastric banding (2008 ); Gastric bypass; Breast biopsy (Right, 01/28/2018); Colonoscopy with propofol (N/A, 07/19/2018); Esophagogastroduodenoscopy (egd) with propofol (N/A, 07/19/2018); and Breast reduction surgery (Bilateral, 04/03/2020). Family: family history includes HIV in her father; Hepatitis in her maternal uncle; Hyperlipidemia in her mother; Hypertension in her mother; Lung cancer in her maternal grandfather.  Laboratory Chemistry Profile   Renal Lab Results  Component Value Date   BUN 24 (H) 11/22/2020   CREATININE 1.35 (H) 40/81/4481   BCR NOT APPLICABLE 85/63/1497   GFR 45.20 (L) 11/22/2020   GFRAA >60 02/15/2014   GFRNONAA >60 02/15/2014     Hepatic Lab Results  Component Value Date   AST 45 (H) 11/22/2020   ALT 48 (H) 11/22/2020   ALBUMIN 4.2 11/22/2020   ALKPHOS 299 (H) 11/22/2020   AMYLASE 33 09/25/2014   LIPASE 124 10/04/2013     Electrolytes Lab Results  Component Value Date   NA 140 11/22/2020   K 4.2 11/22/2020   CL 112 11/22/2020   CALCIUM 8.4 11/22/2020   MG 1.9 07/14/2018   PHOS 5.0 (H) 09/25/2014     Bone Lab Results  Component Value Date   VD25OH 11.89 (L) 11/02/2020     Inflammation (CRP: Acute Phase) (ESR: Chronic Phase) Lab Results  Component Value Date   CRP <1.0 03/10/2019   ESRSEDRATE 3 03/10/2019       Note: Above Lab results reviewed.  Recent Imaging Review  MM 3D SCREEN BREAST BILATERAL CLINICAL DATA:  Screening.  EXAM: DIGITAL SCREENING BILATERAL MAMMOGRAM WITH TOMOSYNTHESIS AND CAD  TECHNIQUE: Bilateral screening digital craniocaudal and mediolateral oblique mammograms were obtained. Bilateral screening digital breast tomosynthesis was performed. The images were evaluated with computer-aided detection.  COMPARISON:  Previous exam(s).  ACR Breast Density Category b: There are scattered areas of fibroglandular density.  FINDINGS: There are no findings suspicious for malignancy. The  images were evaluated with computer-aided detection.  IMPRESSION: No mammographic evidence of malignancy. A result letter of this screening mammogram will be mailed directly to the patient.  RECOMMENDATION: Screening mammogram in one year. (Code:SM-B-01Y)  BI-RADS CATEGORY  1: Negative.  Electronically Signed   By: Lillia Mountain M.D.   On: 11/29/2020 08:05 Note: Reviewed        Physical Exam  General appearance: Well nourished, well developed, and well hydrated. In no apparent acute distress Mental status: Alert, oriented x 3 (person, place, & time)       Respiratory: No evidence of acute respiratory distress Eyes: PERLA Vitals: BP (!) 138/91   Pulse 90   Temp (!) 97.3 F (36.3 C)   Resp 18   Ht _0  (1.6 m)   Wt 162 lb (73.5 kg)   LMP 05/04/2014   SpO2 98%   BMI 28.70 kg/m  BMI: Estimated body mass index is 28.7 kg/m as calculated from the following:  Height as of this encounter: $RemoveBeforeD'5\' 3"'[id number]$  (1.6 m).   Weight as of this encounter: 162 lb (73.5 kg). Ideal: Ideal body weight: 52.4 kg (115 lb 8.3 oz) Adjusted ideal body weight: 60.8 kg (134 lb 1.8 oz)   Left elbow TPI Bilateral shoulder, bilateral hip pain, bilateral knee pain   5 out of 5 strength bilateral lower extremity: Plantar flexion, dorsiflexion, knee flexion, knee extension.   Assessment   Status Diagnosis  Controlled Controlled Controlled 1. Psoriatic arthritis ([place])   2. Chronic pain of both shoulders   3. 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 Glenohumeral arthritis, right   5. Chronic right shoulder pain   6. Chronic arthralgias of knees and hips   7. Anxiety and depression   8. Chronic pain syndrome   9. Fibromyalgia   10. Myofascial pain syndrome       Plan of Care  [name] has a current medication list which includes the following long-term medication(s): albuterol, fluticasone, gabapentin, lamotrigine, pantoprazole, quetiapine, rizatriptan, and sertraline.  Pharmacotherapy  (Medications Ordered): Meds ordered this encounter  Medications  . traMADol (ULTRAM-ER) 100 MG 24 hr tablet    Sig: Take 1 tablet (100 mg total) by mouth daily.    Dispense:  30 tablet    Refill:  2    For chronic pain syndrome  . oxyCODONE-acetaminophen (PERCOCET) 5-325 MG tablet    Sig: Take 1 tablet by mouth every 8 (eight) hours as needed for severe pain.    Dispense:  90 tablet    Refill:  0    Chronic Pain. (STOP Act - Not applicable). Fill one day early if closed on scheduled refill date.  . methocarbamol (ROBAXIN) 500 MG tablet    Sig: Take 1 tablet (500 mg total) by mouth 2 (two) times daily as needed for muscle spasms.    Dispense:  60 tablet    Refill:  0    Do not place this medication, or any other prescription from our practice, on "Automatic Refill". Patient may have prescription filled one day early if pharmacy is closed on scheduled refill date.  Discontinue tizanidine. Percocet prescription as above for quantity 90 to last 3 months  Orders:  Orders Placed This Encounter  Procedures  . TRIGGER POINT INJECTION    Standing Status:   Future    Standing Expiration Date:   03/04/2021    Scheduling Instructions:     Left elbow TPI    Order Specific Question:   Where will this procedure be performed?    Answer:   [facility]   Follow-up plan:   Return in about 2 weeks (around 12/17/2020) for left elbow TPI.   Recent Visits Date Type Provider Dept  09/04/20 Office Visit Gillis Santa, MD [facility]  Showing recent visits within past 90 days and meeting all other requirements Today's Visits Date Type Provider Dept  12/03/20 Office Visit Gillis Santa, MD [facility]  Showing today's visits and meeting all other requirements Future Appointments Date Type Provider Dept  12/12/20 Appointment Gillis Santa, MD [facility]  02/26/21 Appointment Gillis Santa, MD [facility]  Showing future appointments within next 90 days  and meeting all other requirements  I discussed the assessment and treatment plan with the patient. The patient was provided an opportunity to ask questions and all were answered. The patient agreed with the plan and demonstrated an understanding of the instructions.  Patient advised to call back  or seek an in-person evaluation if the symptoms or condition worsens.  Duration of encounter:1mnutes.  Note by: BGillis Santa MD Date: 12/03/2020; Time: 2:18 PM

## 2020-12-04 NOTE — Addendum Note (Signed)
Addended by: Quentin Ore on: 12/04/2020 08:35 AM   Modules accepted: Orders

## 2020-12-12 ENCOUNTER — Ambulatory Visit
Payer: Federal, State, Local not specified - PPO | Admitting: Student in an Organized Health Care Education/Training Program

## 2020-12-18 ENCOUNTER — Other Ambulatory Visit: Payer: Self-pay

## 2020-12-18 ENCOUNTER — Encounter: Payer: Self-pay | Admitting: Internal Medicine

## 2020-12-18 DIAGNOSIS — R748 Abnormal levels of other serum enzymes: Secondary | ICD-10-CM

## 2020-12-19 DIAGNOSIS — R748 Abnormal levels of other serum enzymes: Secondary | ICD-10-CM | POA: Diagnosis not present

## 2020-12-20 LAB — HEPATIC FUNCTION PANEL
ALT: 64 IU/L — ABNORMAL HIGH (ref 0–32)
AST: 58 IU/L — ABNORMAL HIGH (ref 0–40)
Albumin: 4 g/dL (ref 3.8–4.9)
Alkaline Phosphatase: 193 IU/L — ABNORMAL HIGH (ref 44–121)
Bilirubin Total: 0.6 mg/dL (ref 0.0–1.2)
Bilirubin, Direct: 0.18 mg/dL (ref 0.00–0.40)
Total Protein: 5.5 g/dL — ABNORMAL LOW (ref 6.0–8.5)

## 2020-12-29 ENCOUNTER — Other Ambulatory Visit: Payer: Self-pay | Admitting: Student in an Organized Health Care Education/Training Program

## 2020-12-29 DIAGNOSIS — M2550 Pain in unspecified joint: Secondary | ICD-10-CM

## 2020-12-29 DIAGNOSIS — G894 Chronic pain syndrome: Secondary | ICD-10-CM

## 2020-12-29 DIAGNOSIS — A6920 Lyme disease, unspecified: Secondary | ICD-10-CM

## 2020-12-31 ENCOUNTER — Telehealth: Payer: Self-pay

## 2020-12-31 NOTE — Telephone Encounter (Signed)
Pt notified of results through Mychart. Waiting on pt to let me know if she would like to go to [facility].

## 2020-12-31 NOTE — Telephone Encounter (Signed)
-----   Message from Midge Minium, MD sent at 12/30/2020  7:47 AM EDT ----- Let the patient know that the liver enzymes did not improve with the steroid trial and do not fit into any picture of what may be causing these abnormalities.  I would like her to be seen at [facility] hepatology department for his liver enzymes that have been elevated since at least 2019 and the biopsy and blood work have not shown a clear-cut cause for this.

## 2021-01-03 DIAGNOSIS — R8290 Unspecified abnormal findings in urine: Secondary | ICD-10-CM | POA: Diagnosis not present

## 2021-01-03 DIAGNOSIS — R809 Proteinuria, unspecified: Secondary | ICD-10-CM | POA: Diagnosis not present

## 2021-01-03 DIAGNOSIS — N179 Acute kidney failure, unspecified: Secondary | ICD-10-CM | POA: Diagnosis not present

## 2021-01-03 DIAGNOSIS — N2581 Secondary hyperparathyroidism of renal origin: Secondary | ICD-10-CM | POA: Diagnosis not present

## 2021-01-16 DIAGNOSIS — F5105 Insomnia due to other mental disorder: Secondary | ICD-10-CM | POA: Diagnosis not present

## 2021-01-16 DIAGNOSIS — F39 Unspecified mood [affective] disorder: Secondary | ICD-10-CM | POA: Diagnosis not present

## 2021-01-16 DIAGNOSIS — F4312 Post-traumatic stress disorder, chronic: Secondary | ICD-10-CM | POA: Diagnosis not present

## 2021-01-16 DIAGNOSIS — F419 Anxiety disorder, unspecified: Secondary | ICD-10-CM | POA: Diagnosis not present

## 2021-02-07 DIAGNOSIS — N1831 Chronic kidney disease, stage 3a: Secondary | ICD-10-CM | POA: Diagnosis not present

## 2021-02-07 DIAGNOSIS — D631 Anemia in chronic kidney disease: Secondary | ICD-10-CM | POA: Diagnosis not present

## 2021-02-07 DIAGNOSIS — N2581 Secondary hyperparathyroidism of renal origin: Secondary | ICD-10-CM | POA: Diagnosis not present

## 2021-02-07 DIAGNOSIS — R809 Proteinuria, unspecified: Secondary | ICD-10-CM | POA: Diagnosis not present

## 2021-02-08 ENCOUNTER — Other Ambulatory Visit: Payer: Self-pay | Admitting: Nephrology

## 2021-02-08 DIAGNOSIS — N1831 Chronic kidney disease, stage 3a: Secondary | ICD-10-CM

## 2021-02-09 DIAGNOSIS — F419 Anxiety disorder, unspecified: Secondary | ICD-10-CM | POA: Diagnosis not present

## 2021-02-09 DIAGNOSIS — F4312 Post-traumatic stress disorder, chronic: Secondary | ICD-10-CM | POA: Diagnosis not present

## 2021-02-09 DIAGNOSIS — F39 Unspecified mood [affective] disorder: Secondary | ICD-10-CM | POA: Diagnosis not present

## 2021-02-09 DIAGNOSIS — F5105 Insomnia due to other mental disorder: Secondary | ICD-10-CM | POA: Diagnosis not present

## 2021-02-16 ENCOUNTER — Other Ambulatory Visit: Payer: Self-pay | Admitting: Student in an Organized Health Care Education/Training Program

## 2021-02-21 ENCOUNTER — Encounter: Payer: Self-pay | Admitting: Internal Medicine

## 2021-02-25 DIAGNOSIS — R200 Anesthesia of skin: Secondary | ICD-10-CM | POA: Diagnosis not present

## 2021-02-25 DIAGNOSIS — R202 Paresthesia of skin: Secondary | ICD-10-CM | POA: Diagnosis not present

## 2021-02-25 DIAGNOSIS — M1612 Unilateral primary osteoarthritis, left hip: Secondary | ICD-10-CM | POA: Diagnosis not present

## 2021-02-25 DIAGNOSIS — M25552 Pain in left hip: Secondary | ICD-10-CM | POA: Diagnosis not present

## 2021-02-26 ENCOUNTER — Encounter: Payer: Self-pay | Admitting: Student in an Organized Health Care Education/Training Program

## 2021-02-26 ENCOUNTER — Other Ambulatory Visit: Payer: Self-pay

## 2021-02-26 ENCOUNTER — Ambulatory Visit
Payer: Federal, State, Local not specified - PPO | Attending: Student in an Organized Health Care Education/Training Program | Admitting: Student in an Organized Health Care Education/Training Program

## 2021-02-26 VITALS — BP 122/73 | HR 95 | Temp 97.0°F | Resp 15 | Ht 63.0 in | Wt 161.0 lb

## 2021-02-26 DIAGNOSIS — G894 Chronic pain syndrome: Secondary | ICD-10-CM

## 2021-02-26 DIAGNOSIS — M25562 Pain in left knee: Secondary | ICD-10-CM | POA: Diagnosis not present

## 2021-02-26 DIAGNOSIS — M25551 Pain in right hip: Secondary | ICD-10-CM | POA: Insufficient documentation

## 2021-02-26 DIAGNOSIS — F32A Depression, unspecified: Secondary | ICD-10-CM | POA: Insufficient documentation

## 2021-02-26 DIAGNOSIS — M25561 Pain in right knee: Secondary | ICD-10-CM

## 2021-02-26 DIAGNOSIS — F419 Anxiety disorder, unspecified: Secondary | ICD-10-CM

## 2021-02-26 DIAGNOSIS — M25552 Pain in left hip: Secondary | ICD-10-CM

## 2021-02-26 DIAGNOSIS — M25511 Pain in right shoulder: Secondary | ICD-10-CM | POA: Diagnosis not present

## 2021-02-26 DIAGNOSIS — L4050 Arthropathic psoriasis, unspecified: Secondary | ICD-10-CM | POA: Insufficient documentation

## 2021-02-26 DIAGNOSIS — G8929 Other chronic pain: Secondary | ICD-10-CM | POA: Insufficient documentation

## 2021-02-26 DIAGNOSIS — M19011 Primary osteoarthritis, right shoulder: Secondary | ICD-10-CM | POA: Insufficient documentation

## 2021-02-26 DIAGNOSIS — M25512 Pain in left shoulder: Secondary | ICD-10-CM

## 2021-02-26 DIAGNOSIS — F5105 Insomnia due to other mental disorder: Secondary | ICD-10-CM | POA: Diagnosis not present

## 2021-02-26 DIAGNOSIS — M2550 Pain in unspecified joint: Secondary | ICD-10-CM

## 2021-02-26 DIAGNOSIS — A6920 Lyme disease, unspecified: Secondary | ICD-10-CM | POA: Diagnosis not present

## 2021-02-26 DIAGNOSIS — F39 Unspecified mood [affective] disorder: Secondary | ICD-10-CM | POA: Diagnosis not present

## 2021-02-26 DIAGNOSIS — F4312 Post-traumatic stress disorder, chronic: Secondary | ICD-10-CM | POA: Diagnosis not present

## 2021-02-26 MED ORDER — GABAPENTIN 400 MG PO CAPS: 400.0000 mg | ORAL_CAPSULE | Freq: Every day | ORAL | 2 refills | Status: DC

## 2021-02-26 MED ORDER — OXYCODONE-ACETAMINOPHEN 5-325 MG PO TABS: 1.0000 | ORAL_TABLET | Freq: Three times a day (TID) | ORAL | 0 refills | Status: DC | PRN

## 2021-02-26 MED ORDER — TRAMADOL HCL ER 100 MG PO TB24: 100.0000 mg | ORAL_TABLET | Freq: Every day | ORAL | 2 refills | Status: DC

## 2021-02-26 NOTE — Progress Notes (Signed)
Nursing Pain Medication Assessment:  Safety precautions to be maintained throughout the outpatient stay will include: orient to surroundings, keep bed in low position, maintain call bell within reach at all times, provide assistance with transfer out of bed and ambulation.  Medication Inspection Compliance: Barbara Faulkner did not comply with our request to bring her pills to be counted. She was reminded that bringing the medication bottles, even when empty, is a requirement.  Medication: None brought in. Pill/Patch Count: None available to be counted. Bottle Appearance: No container available. Did not bring bottle(s) to appointment. Filled Date: N/A Last Medication intake:  Today OXYCODONE  Nursing Pain Medication Assessment:  Safety precautions to be maintained throughout the outpatient stay will include: orient to surroundings, keep bed in low position, maintain call bell within reach at all times, provide assistance with transfer out of bed and ambulation.  Medication Inspection Compliance: Pill count conducted under aseptic conditions, in front of the patient. Neither the pills nor the bottle was removed from the patient's sight at any time. Once count was completed pills were immediately returned to the patient in their original bottle.  Medication: Tramadol (Ultram) Pill/Patch Count:  14 of 30 pills remain Pill/Patch Appearance: Markings consistent with prescribed medication Bottle Appearance: Standard pharmacy container. Clearly labeled. Filled Date: 06 / 10 / 2022 Last Medication intake:  Today

## 2021-02-26 NOTE — Progress Notes (Signed)
PROVIDER NOTE: Information contained herein reflects review and annotations entered in association with encounter. Interpretation of such information and data should be left to medically-trained personnel. Information provided to patient can be located elsewhere in the medical record under "Patient Instructions". Document created using STT-dictation technology, any transcriptional errors that may result from process are unintentional.    Patient: Barbara Faulkner  Service Category: E/M  Provider: Gillis Santa, MD  DOB: 16-Jun-1966  DOS: 02/26/2021  Specialty: Interventional Pain Management  MRN: [id number]  Setting: Ambulatory outpatient  PCP: McLean-Scocuzza, Nino Glow, MD  Type: Established Patient    Referring Provider: Orland Mustard *  Location: Office  Delivery: Face-to-face     HPI  Barbara Faulkner, a 55 y.o. year old female, is here today because of her Psoriatic arthritis ([place]) [L40.50]. Ms. Rarick primary complain today is Hip Pain (LEFT) Last encounter: My last encounter with her was on 12/03/2020. Pertinent problems: Ms. 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 Pain Assessment: Severity of Chronic pain is reported as a 7 /10. Location: Hip Left/denies. Onset: More than a month ago. Quality: Other (Comment) (intense). Timing: Constant. Modifying factor(s): meds, exercise. Vitals:  height is 5' 3" (1.6 m) and weight is 161 lb (73 kg). Her temporal temperature is 97 F (36.1 C) (abnormal). Her blood pressure is 122/73 and her pulse is 95. Her respiration is 15 and oxygen saturation is 97%.   Reason for encounter: medication management.     Since the patient's last visit with me, she has had a visit with orthopedic surgery primarily for her left hip pain and had a ultrasound guided left hip injection that was done  yesterday.  She has been told that she may need a left hip replacement.  She is following Dr. Candelaria Stagers for this. Was also diagnosed with carpal tunnel syndrome and instructed to wear wrist splint. She continues on tramadol 100 mg ER as well as oxycodone 5 mg as needed for breakthrough pain Will restart Gabapentin.  This was previously discontinued given an elevation in her LFTs but gabapentin was not the culprit so we will restart at 400 mg nightly.  She states that she has been having trouble sleeping at night and worsening restless leg symptoms as result of not being on gabapentin.  Pharmacotherapy Assessment   Analgesic: Tramadol 100 mg ER, continue oxycodone 5 mg daily for breakthrough pain.     Monitoring: [place] PMP: PDMP reviewed during this encounter.       Pharmacotherapy: No side-effects or adverse reactions reported. Compliance: No problems identified. Effectiveness: Clinically acceptable.  UDS:  Summary  Date Value Ref Range Status  02/08/2020 Note  Final    Comment:    ==================================================================== Tox[employer]SURE Select 13 (MW) ==================================================================== Test                             Result       Flag       Units  Drug Present and Declared for Prescription Verification   Tramadol                       >4237        EXPECTED   ng/mg creat   O-Desmethyltramadol            >  4237        EXPECTED   ng/mg creat   N-Desmethyltramadol            1276         EXPECTED   ng/mg creat    Source of tramadol is a prescription medication. O-desmethyltramadol    and N-desmethyltramadol are expected metabolites of tramadol.  ==================================================================== Test                      Result    Flag   Units      Ref Range   Creatinine              118              mg/dL      >=20 ==================================================================== Declared Medications:  The  flagging and interpretation on this report are based on the  following declared medications.  Unexpected results may arise from  inaccuracies in the declared medications.   **Note: The testing scope of this panel includes these medications:   Tramadol (Ultram)   **Note: The testing scope of this panel does not include the  following reported medications:   Fluticasone (Flonase)  Gabapentin (Neurontin)  Iron  Magnesium (Mag-Ox)  Multivitamin  Omeprazole (Prilosec)  Quetiapine (Seroquel)  Sertraline (Zoloft)  Tizanidine (Zanaflex)  Topical Diclofenac ==================================================================== For clinical consultation, please call [phone]. ====================================================================       ROS  Constitutional: Denies any fever or chills Gastrointestinal: No reported hemesis, hematochezia, vomiting, or acute GI distress Musculoskeletal:  Left elbow pain, bilateral hip pain left>right Neurological: No reported episodes of acute onset apraxia, aphasia, dysarthria, agnosia, amnesia, paralysis, loss of coordination, or loss of consciousness  Medication Review  Cholecalciferol, QUEtiapine, albuterol, diclofenac sodium, fluticasone, gabapentin, lamoTRIgine, methocarbamol, oxyCODONE-acetaminophen, pantoprazole, rizatriptan, and traMADol  History Review  Allergy: Ms. Kinsella is allergic to shellfish allergy. Drug: Ms. Umbaugh  reports no history of drug use. Alcohol:  reports current alcohol use. Tobacco:  reports that she quit smoking about 17 years ago. Her smoking use included cigarettes. She has a 40.00 pack-year smoking history. She has never used smokeless tobacco. Social: Ms. Cranfield  reports that she quit smoking about 17 years ago. Her smoking use included cigarettes. She has a 40.00 pack-year smoking history. She has never used smokeless tobacco. She reports current alcohol use. She reports that she does not use  drugs. Medical:  has a past medical history of Allergy, Anxiety, Chronic kidney disease, Chronic pain, Chronic sinusitis, COVID-19, Depression, Heart murmur, Hiatal hernia, HPV (human papilloma virus) anogenital infection, Macromastia, Migraine, and Psoriatic arthritis (HCC). Surgical: Ms. Leazer  has a past surgical history that includes Cholecystectomy (2009); Tubal ligation (1997); Laparoscopic gastric banding (2008 ); Gastric bypass; Breast biopsy (Right, 01/28/2018); Colonoscopy with propofol (N/A, 07/19/2018); Esophagogastroduodenoscopy (egd) with propofol (N/A, 07/19/2018); and Breast reduction surgery (Bilateral, 04/03/2020). Family: family history includes HIV in her father; Hepatitis in her maternal uncle; Hyperlipidemia in her mother; Hypertension in her mother; Lung cancer in her maternal grandfather.  Laboratory Chemistry Profile   Renal Lab Results  Component Value Date   BUN 24 (H) 11/22/2020   CREATININE 1.35 (H) 11/22/2020   BCR NOT APPLICABLE 07/14/2018   GFR 45.20 (L) 11/22/2020   GFRAA >60 02/15/2014   GFRNONAA >60 02/15/2014     Hepatic Lab Results  Component Value Date   AST 58 (H) 12/19/2020   ALT 64 (H) 12/19/2020   ALBUMIN 4.0 12/19/2020   ALKPHOS 193 (H)   12/19/2020   AMYLASE 33 09/25/2014   LIPASE 124 10/04/2013     Electrolytes Lab Results  Component Value Date   NA 140 11/22/2020   K 4.2 11/22/2020   CL 112 11/22/2020   CALCIUM 8.4 11/22/2020   MG 1.9 07/14/2018   PHOS 5.0 (H) 09/25/2014     Bone Lab Results  Component Value Date   VD25OH 11.89 (L) 11/02/2020     Inflammation (CRP: Acute Phase) (ESR: Chronic Phase) Lab Results  Component Value Date   CRP <1.0 03/10/2019   ESRSEDRATE 3 03/10/2019       Note: Above Lab results reviewed.  Recent Imaging Review  MM 3D SCREEN BREAST BILATERAL CLINICAL DATA:  Screening.  EXAM: DIGITAL SCREENING BILATERAL MAMMOGRAM WITH TOMOSYNTHESIS AND CAD  TECHNIQUE: Bilateral screening digital  craniocaudal and mediolateral oblique mammograms were obtained. Bilateral screening digital breast tomosynthesis was performed. The images were evaluated with computer-aided detection.  COMPARISON:  Previous exam(s).  ACR Breast Density Category b: There are scattered areas of fibroglandular density.  FINDINGS: There are no findings suspicious for malignancy. The images were evaluated with computer-aided detection.  IMPRESSION: No mammographic evidence of malignancy. A result letter of this screening mammogram will be mailed directly to the patient.  RECOMMENDATION: Screening mammogram in one year. (Code:[id number]-B-01Y)  BI-RADS CATEGORY  1: Negative.  Electronically Signed   By: Lillia Mountain M.D.   On: 11/29/2020 08:05 Note: Reviewed        Physical Exam  General appearance: Well nourished, well developed, and well hydrated. In no apparent acute distress Mental status: Alert, oriented x 3 (person, place, & time)       Respiratory: No evidence of acute respiratory distress Eyes: PERLA Vitals: BP 122/73   Pulse 95   Temp (!) 97 F (36.1 C) (Temporal)   Resp 15   Ht 5' 3" (1.6 m)   Wt 161 lb (73 kg)   LMP 05/04/2014   SpO2 97%   BMI 28.52 kg/m  BMI: Estimated body mass index is 28.52 kg/m as calculated from the following:   Height as of this encounter: 5' 3" (1.6 m).   Weight as of this encounter: 161 lb (73 kg). Ideal: Ideal body weight: 52.4 kg (115 lb 8.3 oz) Adjusted ideal body weight: 60.7 kg (133 lb 11.4 oz)   Left elbow TPI Bilateral shoulder, bilateral hip pain, bilateral knee pain    5 out of 5 strength bilateral lower extremity: Plantar flexion, dorsiflexion, knee flexion, knee extension.   Assessment   Status Diagnosis  Controlled Controlled Controlled 1. Psoriatic arthritis ([place])   2. Chronic pain of both shoulders   3. 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 Glenohumeral arthritis, right   5. Chronic right shoulder pain   6. Chronic arthralgias of  knees and hips   7. Anxiety and depression   8. Chronic pain syndrome        Plan of Care  Ms. DONDRA RHETT has a current medication list which includes the following long-term medication(s): fluticasone, lamotrigine, pantoprazole, quetiapine, rizatriptan, albuterol, and gabapentin.  Pharmacotherapy (Medications Ordered): Meds ordered this encounter  Medications   gabapentin (NEURONTIN) 400 MG capsule    Sig: Take 1 capsule (400 mg total) by mouth at bedtime.    Dispense:  30 capsule    Refill:  2   traMADol (ULTRAM-ER) 100 MG 24 hr tablet    Sig: Take 1 tablet (100 mg total) by mouth daily.    Dispense:  30 tablet    Refill:  2    For chronic pain syndrome   oxyCODONE-acetaminophen (PERCOCET) 5-325 MG tablet    Sig: Take 1 tablet by mouth every 8 (eight) hours as needed for severe pain.    Dispense:  90 tablet    Refill:  0    Chronic Pain. (STOP Act - Not applicable). Fill one day early if closed on scheduled refill date.   Percocet prescription as above for quantity 90 to last 3 months  Orders:  Orders Placed This Encounter  Procedures   ToxASSURE Select 13 (MW), Urine    Volume: 30 ml(s). Minimum 3 ml of urine is needed. Document temperature of fresh sample. Indications: Long term (current) use of opiate analgesic (Z79.891)    Order Specific Question:   Release to patient    Answer:   Immediate    Follow-up plan:   Return in about 3 months (around 05/29/2021) for Medication Management, in person.   Recent Visits Date Type Provider Dept  12/03/20 Office Visit Lateef, Bilal, MD [facility]  Showing recent visits within past 90 days and meeting all other requirements Today's Visits Date Type Provider Dept  02/26/21 Office Visit Lateef, Bilal, MD [facility]  Showing today's visits and meeting all other requirements Future Appointments No visits were found meeting these conditions. Showing future appointments within next 90 days and meeting  all other requirements I discussed the assessment and treatment plan with the patient. The patient was provided an opportunity to ask questions and all were answered. The patient agreed with the plan and demonstrated an understanding of the instructions.  Patient advised to call back or seek an in-person evaluation if the symptoms or condition worsens.  Duration of encounter:30minutes.  Note by: Bilal Lateef, MD Date: 02/26/2021; Time: 2:25 PM 

## 2021-03-07 ENCOUNTER — Ambulatory Visit: Payer: Federal, State, Local not specified - PPO

## 2021-03-11 DIAGNOSIS — G43011 Migraine without aura, intractable, with status migrainosus: Secondary | ICD-10-CM | POA: Diagnosis not present

## 2021-03-25 ENCOUNTER — Ambulatory Visit
Admission: RE | Admit: 2021-03-25 | Discharge: 2021-03-25 | Disposition: A | Discharge status: Home / Self Care | Payer: Federal, State, Local not specified - PPO | Source: Ambulatory Visit | Attending: Nephrology | Admitting: Nephrology

## 2021-03-25 ENCOUNTER — Other Ambulatory Visit: Payer: Self-pay

## 2021-03-25 DIAGNOSIS — N1831 Chronic kidney disease, stage 3a: Secondary | ICD-10-CM | POA: Diagnosis not present

## 2021-03-25 DIAGNOSIS — N189 Chronic kidney disease, unspecified: Secondary | ICD-10-CM | POA: Diagnosis not present

## 2021-03-25 DIAGNOSIS — N281 Cyst of kidney, acquired: Secondary | ICD-10-CM | POA: Diagnosis not present

## 2021-04-18 DIAGNOSIS — I10 Essential (primary) hypertension: Secondary | ICD-10-CM | POA: Insufficient documentation

## 2021-04-18 DIAGNOSIS — N2581 Secondary hyperparathyroidism of renal origin: Secondary | ICD-10-CM | POA: Insufficient documentation

## 2021-04-18 DIAGNOSIS — N189 Chronic kidney disease, unspecified: Secondary | ICD-10-CM | POA: Insufficient documentation

## 2021-04-18 DIAGNOSIS — N1831 Chronic kidney disease, stage 3a: Secondary | ICD-10-CM | POA: Insufficient documentation

## 2021-04-18 DIAGNOSIS — D631 Anemia in chronic kidney disease: Secondary | ICD-10-CM | POA: Diagnosis not present

## 2021-04-27 ENCOUNTER — Other Ambulatory Visit: Payer: Self-pay | Admitting: Student in an Organized Health Care Education/Training Program

## 2021-04-27 DIAGNOSIS — G894 Chronic pain syndrome: Secondary | ICD-10-CM

## 2021-04-27 DIAGNOSIS — A6920 Lyme disease, unspecified: Secondary | ICD-10-CM

## 2021-04-27 DIAGNOSIS — L4050 Arthropathic psoriasis, unspecified: Secondary | ICD-10-CM

## 2021-04-27 DIAGNOSIS — M25511 Pain in right shoulder: Secondary | ICD-10-CM

## 2021-04-27 DIAGNOSIS — G8929 Other chronic pain: Secondary | ICD-10-CM

## 2021-04-27 DIAGNOSIS — M2550 Pain in unspecified joint: Secondary | ICD-10-CM

## 2021-05-01 ENCOUNTER — Other Ambulatory Visit: Payer: Self-pay | Admitting: Student in an Organized Health Care Education/Training Program

## 2021-05-01 DIAGNOSIS — M25511 Pain in right shoulder: Secondary | ICD-10-CM

## 2021-05-01 DIAGNOSIS — G894 Chronic pain syndrome: Secondary | ICD-10-CM

## 2021-05-01 DIAGNOSIS — A6920 Lyme disease, unspecified: Secondary | ICD-10-CM

## 2021-05-01 DIAGNOSIS — G8929 Other chronic pain: Secondary | ICD-10-CM

## 2021-05-01 DIAGNOSIS — L4050 Arthropathic psoriasis, unspecified: Secondary | ICD-10-CM

## 2021-05-01 DIAGNOSIS — M2550 Pain in unspecified joint: Secondary | ICD-10-CM

## 2021-05-02 ENCOUNTER — Telehealth: Payer: Self-pay | Admitting: Student in an Organized Health Care Education/Training Program

## 2021-05-02 NOTE — Telephone Encounter (Signed)
Patient was given a prescription for oxycodone to be filled on 03-03-2021 with a quantity of 90 to last for 3 months per Dr Cherylann Ratel notes.  She is calling to ask for a refill today.  I attempted to call the patient but I got the answering machine and left a message for her to call the office back.

## 2021-05-02 NOTE — Telephone Encounter (Signed)
Spoke with patient and let her know that the prescription had been to last for 3 months.  I will send to Dr Cherylann Ratel so that he is aware that she is out of her medications and does not have a return appointment until the end of September.

## 2021-05-07 NOTE — Telephone Encounter (Signed)
Please schedule patient and let her know.  thanks

## 2021-05-09 DIAGNOSIS — F4312 Post-traumatic stress disorder, chronic: Secondary | ICD-10-CM | POA: Diagnosis not present

## 2021-05-16 DIAGNOSIS — F4312 Post-traumatic stress disorder, chronic: Secondary | ICD-10-CM | POA: Diagnosis not present

## 2021-05-17 DIAGNOSIS — F4312 Post-traumatic stress disorder, chronic: Secondary | ICD-10-CM | POA: Diagnosis not present

## 2021-05-17 DIAGNOSIS — F419 Anxiety disorder, unspecified: Secondary | ICD-10-CM | POA: Diagnosis not present

## 2021-05-17 DIAGNOSIS — F5105 Insomnia due to other mental disorder: Secondary | ICD-10-CM | POA: Diagnosis not present

## 2021-05-17 DIAGNOSIS — F39 Unspecified mood [affective] disorder: Secondary | ICD-10-CM | POA: Diagnosis not present

## 2021-05-20 DIAGNOSIS — F39 Unspecified mood [affective] disorder: Secondary | ICD-10-CM | POA: Diagnosis not present

## 2021-05-20 DIAGNOSIS — F419 Anxiety disorder, unspecified: Secondary | ICD-10-CM | POA: Diagnosis not present

## 2021-05-21 ENCOUNTER — Ambulatory Visit
Payer: Federal, State, Local not specified - PPO | Attending: Student in an Organized Health Care Education/Training Program | Admitting: Student in an Organized Health Care Education/Training Program

## 2021-05-21 ENCOUNTER — Encounter: Payer: Self-pay | Admitting: Student in an Organized Health Care Education/Training Program

## 2021-05-21 ENCOUNTER — Other Ambulatory Visit: Payer: Self-pay

## 2021-05-21 VITALS — BP 127/86 | HR 82 | Temp 97.2°F | Resp 15 | Ht 63.0 in | Wt 164.0 lb

## 2021-05-21 DIAGNOSIS — M25562 Pain in left knee: Secondary | ICD-10-CM | POA: Insufficient documentation

## 2021-05-21 DIAGNOSIS — G8929 Other chronic pain: Secondary | ICD-10-CM | POA: Diagnosis not present

## 2021-05-21 DIAGNOSIS — A6920 Lyme disease, unspecified: Secondary | ICD-10-CM | POA: Diagnosis not present

## 2021-05-21 DIAGNOSIS — L4050 Arthropathic psoriasis, unspecified: Secondary | ICD-10-CM | POA: Diagnosis not present

## 2021-05-21 DIAGNOSIS — F32A Depression, unspecified: Secondary | ICD-10-CM | POA: Insufficient documentation

## 2021-05-21 DIAGNOSIS — G894 Chronic pain syndrome: Secondary | ICD-10-CM | POA: Diagnosis not present

## 2021-05-21 DIAGNOSIS — M19011 Primary osteoarthritis, right shoulder: Secondary | ICD-10-CM | POA: Insufficient documentation

## 2021-05-21 DIAGNOSIS — M25512 Pain in left shoulder: Secondary | ICD-10-CM | POA: Diagnosis not present

## 2021-05-21 DIAGNOSIS — F419 Anxiety disorder, unspecified: Secondary | ICD-10-CM | POA: Insufficient documentation

## 2021-05-21 DIAGNOSIS — M2550 Pain in unspecified joint: Secondary | ICD-10-CM | POA: Diagnosis not present

## 2021-05-21 DIAGNOSIS — M25561 Pain in right knee: Secondary | ICD-10-CM | POA: Diagnosis not present

## 2021-05-21 DIAGNOSIS — M25551 Pain in right hip: Secondary | ICD-10-CM | POA: Diagnosis not present

## 2021-05-21 DIAGNOSIS — M25552 Pain in left hip: Secondary | ICD-10-CM | POA: Diagnosis not present

## 2021-05-21 DIAGNOSIS — M25511 Pain in right shoulder: Secondary | ICD-10-CM | POA: Insufficient documentation

## 2021-05-21 MED ORDER — TRAMADOL HCL ER 200 MG PO TB24: 200.0000 mg | ORAL_TABLET | Freq: Every day | ORAL | 2 refills | Status: DC

## 2021-05-21 MED ORDER — OXYCODONE-ACETAMINOPHEN 5-325 MG PO TABS: 1.0000 | ORAL_TABLET | Freq: Three times a day (TID) | ORAL | 0 refills | Status: DC | PRN

## 2021-05-21 NOTE — Progress Notes (Signed)
Nursing Pain Medication Assessment:  Safety precautions to be maintained throughout the outpatient stay will include: orient to surroundings, keep bed in low position, maintain call bell within reach at all times, provide assistance with transfer out of bed and ambulation.  Medication Inspection Compliance: Pill count conducted under aseptic conditions, in front of the patient. Neither the pills nor the bottle was removed from the patient's sight at any time. Once count was completed pills were immediately returned to the patient in their original bottle.  Medication #1: Oxycodone/APAP Pill/Patch Count:  0 of 90 pills remain Pill/Patch Appearance: Markings consistent with prescribed medication Bottle Appearance: Standard pharmacy container. Clearly labeled. Filled Date: 06 / 28 / 2022 Last Medication intake:  Ran out of medicine more than 48 hours ago  Medication #2: Tramadol (Ultram) Pill/Patch Count:  26 of 30 pills remain Pill/Patch Appearance: Markings consistent with prescribed medication Bottle Appearance: Standard pharmacy container. Clearly labeled. Filled Date: 09 / 12 /  2022 Last Medication intake:  Today

## 2021-05-21 NOTE — Progress Notes (Signed)
PROVIDER NOTE: Information contained herein reflects review and annotations entered in association with encounter. Interpretation of such information and data should be left to medically-trained personnel. Information provided to patient can be located elsewhere in the medical record under "Patient Instructions". Document created using STT-dictation technology, any transcriptional errors that may result from process are unintentional.    Patient: Barbara Faulkner  Service Category: E/M  Provider: Gillis Santa, MD  DOB: 05/06/1966  DOS: 02/26/2021  Specialty: Interventional Pain Management  MRN: [id number]  Setting: Ambulatory outpatient  PCP: McLean-Scocuzza, Nino Glow, MD  Type: Established Patient    Referring Provider: Orland Mustard *  Location: Office  Delivery: Face-to-face     HPI  [name], a 55 y.o. year old female, is here today because of her Psoriatic arthritis ([place]) [L40.50]. [name] primary complain today is Joint Pain Last encounter: My last encounter with her was on 12/03/2020. Pertinent problems: 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 Pain Assessment: Severity of Chronic pain is reported as a 8 /10. Location: Other (Comment) (joint pain)  / . Onset: More than a month ago. Quality: Constant, Sharp. Timing: Constant. Modifying factor(s): meds. Vitals:  height is 5' 3" (1.6 m) and weight is 164 lb (74.4 kg). Her temporal temperature is 97.2 F (36.2 C) (abnormal). Her blood pressure is 127/86 and her pulse is 82. Her respiration is 15 and oxygen saturation is 97%.   Reason for encounter: medication management.     No change in medical history since last visit. Patient continues multimodal pain regimen as prescribed.  Patient states that she has been utilizing more oxycodone than prescribed  given acute breakthrough pain.  I informed her to oxycodone to no more than 1 tablet/day as this is intended for breakthrough pain.  We discussed increasing her tramadol to 200 mg ER for the purpose of providing a longer duration of pain relief so that the patient does not need to utilize oxycodone during the day.  She states that tramadol and oxycodone to provide pain relief and improvement in functional status.  I counseled her on the appropriate intake instructions for tramadol and oxycodone.  I told her to be mindful of risk of serotonin syndrome as she is on Seroquel.   Pharmacotherapy Assessment   Analgesic: Tramadol 200 mg ER, continue oxycodone 5 mg daily for breakthrough pain.     Monitoring: [place] PMP: PDMP reviewed during this encounter.       Pharmacotherapy: No side-effects or adverse reactions reported. Compliance: No problems identified. Effectiveness: Clinically acceptable.  UDS:  Summary  Date Value Ref Range Status  02/08/2020 Note  Final    Comment:    ==================================================================== ToxASSURE Select 13 (MW) ==================================================================== Test                             Result       Flag       Units  Drug Present and Declared for Prescription Verification   Tramadol                       >4237        EXPECTED   ng/mg creat   O-Desmethyltramadol            >4237  EXPECTED   ng/mg creat   N-Desmethyltramadol            1276         EXPECTED   ng/mg creat    Source of tramadol is a prescription medication. O-desmethyltramadol    and N-desmethyltramadol are expected metabolites of tramadol.  ==================================================================== Test                      Result    Flag   Units      Ref Range   Creatinine              118              mg/dL      >=20 ==================================================================== Declared Medications:  The flagging and  interpretation on this report are based on the  following declared medications.  Unexpected results may arise from  inaccuracies in the declared medications.   **Note: The testing scope of this panel includes these medications:   Tramadol (Ultram)   **Note: The testing scope of this panel does not include the  following reported medications:   Fluticasone (Flonase)  Gabapentin (Neurontin)  Iron  Magnesium (Mag-Ox)  Multivitamin  Omeprazole (Prilosec)  Quetiapine (Seroquel)  Sertraline (Zoloft)  Tizanidine (Zanaflex)  Topical Diclofenac ==================================================================== For clinical consultation, please call [phone]. ====================================================================       ROS  Constitutional: Denies any fever or chills Gastrointestinal: No reported hemesis, hematochezia, vomiting, or acute GI distress Musculoskeletal:  Left elbow pain, bilateral hip pain left>right Neurological: No reported episodes of acute onset apraxia, aphasia, dysarthria, agnosia, amnesia, paralysis, loss of coordination, or loss of consciousness  Medication Review  Cholecalciferol, QUEtiapine, albuterol, diclofenac sodium, fluticasone, gabapentin, lamoTRIgine, methocarbamol, oxyCODONE-acetaminophen, pantoprazole, rizatriptan, and traMADol  History Review  Allergy: [name] is allergic to shellfish allergy. Drug: [name]  reports no history of drug use. Alcohol:  reports current alcohol use. Tobacco:  reports that she quit smoking about 17 years ago. Her smoking use included cigarettes. She has a 40.00 pack-year smoking history. She has never used smokeless tobacco. Social: [name]  reports that she quit smoking about 17 years ago. Her smoking use included cigarettes. She has a 40.00 pack-year smoking history. She has never used smokeless tobacco. She reports current alcohol use. She reports that she does not use drugs. Medical:   has a past medical history of Allergy, Anxiety, Chronic kidney disease, Chronic pain, Chronic sinusitis, COVID-19, Depression, Heart murmur, Hiatal hernia, HPV (human papilloma virus) anogenital infection, Macromastia, Migraine, and Psoriatic arthritis (HCC). Surgical: [name]  has a past surgical history that includes Cholecystectomy (2009); Tubal ligation (1997); Laparoscopic gastric banding (2008 ); Gastric bypass; Breast biopsy (Right, 01/28/2018); Colonoscopy with propofol (N/A, 07/19/2018); Esophagogastroduodenoscopy (egd) with propofol (N/A, 07/19/2018); and Breast reduction surgery (Bilateral, 04/03/2020). Family: family history includes HIV in her father; Hepatitis in her maternal uncle; Hyperlipidemia in her mother; Hypertension in her mother; Lung cancer in her maternal grandfather.  Laboratory Chemistry Profile   Renal Lab Results  Component Value Date   BUN 24 (H) 11/22/2020   CREATININE 1.35 (H) 11/22/2020   BCR NOT APPLICABLE 07/14/2018   GFR 45.20 (L) 11/22/2020   GFRAA >60 02/15/2014   GFRNONAA >60 02/15/2014     Hepatic Lab Results  Component Value Date   AST 58 (H) 12/19/2020   ALT 64 (H) 12/19/2020   ALBUMIN 4.0 12/19/2020   ALKPHOS 193 (H) 12/19/2020   AMYLASE 33 09/25/2014     LIPASE 124 10/04/2013     Electrolytes Lab Results  Component Value Date   NA 140 11/22/2020   K 4.2 11/22/2020   CL 112 11/22/2020   CALCIUM 8.4 11/22/2020   MG 1.9 07/14/2018   PHOS 5.0 (H) 09/25/2014     Bone Lab Results  Component Value Date   VD25OH 11.89 (L) 11/02/2020     Inflammation (CRP: Acute Phase) (ESR: Chronic Phase) Lab Results  Component Value Date   CRP <1.0 03/10/2019   ESRSEDRATE 3 03/10/2019       Note: Above Lab results reviewed.  Recent Imaging Review  US RENAL CLINICAL DATA:  CKD  EXAM: RENAL / URINARY TRACT ULTRASOUND COMPLETE  COMPARISON:  CT abdomen pelvis 03/10/2019  FINDINGS: Right Kidney:  Renal measurements: 8.6 x 6.1 x 5.1 cm =  volume: 141 mL. Echogenicity within normal limits. No mass or hydronephrosis visualized. 3.1 cm simple cyst seen in the mid kidney. Limited visualization of the right kidney.  Left Kidney:  Renal measurements: 8.8 x 6.1 x 4.1 cm = volume: 114 mL. Echogenicity within normal limits. No mass or hydronephrosis visualized. Limited visualization of the left kidney.  Bladder:  Appears normal for degree of bladder distention.  Other:  None.  IMPRESSION: Limited visualization of the kidneys due to shadowing bowel gas. No significant sonographic abnormality identified.  Electronically Signed   By: Farhaan  Mir M.D.   On: 03/26/2021 14:04 Note: Reviewed        Physical Exam  General appearance: Well nourished, well developed, and well hydrated. In no apparent acute distress Mental status: Alert, oriented x 3 (person, place, & time)       Respiratory: No evidence of acute respiratory distress Eyes: PERLA Vitals: BP 127/86   Pulse 82   Temp (!) 97.2 F (36.2 C) (Temporal)   Resp 15   Ht 5' 3" (1.6 m)   Wt 164 lb (74.4 kg)   LMP 05/04/2014   SpO2 97%   BMI 29.05 kg/m  BMI: Estimated body mass index is 29.05 kg/m as calculated from the following:   Height as of this encounter: 5' 3" (1.6 m).   Weight as of this encounter: 164 lb (74.4 kg). Ideal: Ideal body weight: 52.4 kg (115 lb 8.3 oz) Adjusted ideal body weight: 61.2 kg (134 lb 14.6 oz)    Bilateral shoulder, bilateral hip pain, bilateral knee pain    5 out of 5 strength bilateral lower extremity: Plantar flexion, dorsiflexion, knee flexion, knee extension.   Assessment   Status Diagnosis  Controlled Controlled Controlled 1. Psoriatic arthritis (HCC)   2. Chronic pain of both shoulders   3. 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 Glenohumeral arthritis, right   5. Chronic right shoulder pain   6. Chronic arthralgias of knees and hips   7. Anxiety and depression   8. Chronic pain syndrome         Plan of  Care  Barbara Faulkner has a current medication list which includes the following long-term medication(s): fluticasone, gabapentin, lamotrigine, pantoprazole, quetiapine, rizatriptan, and albuterol.  Pharmacotherapy (Medications Ordered): Meds ordered this encounter  Medications   oxyCODONE-acetaminophen (PERCOCET) 5-325 MG tablet    Sig: Take 1 tablet by mouth every 8 (eight) hours as needed for severe pain.    Dispense:  90 tablet    Refill:  0    Chronic Pain. (STOP Act - Not applicable). Fill one day early if closed on scheduled refill date.     traMADol (ULTRAM-ER) 200 MG 24 hr tablet    Sig: Take 1 tablet (200 mg total) by mouth daily.    Dispense:  30 tablet    Refill:  2    Percocet prescription as above for quantity 90 to last 3 months  Orders:  Orders Placed This Encounter  Procedures   ToxASSURE Select 13 (MW), Urine    Volume: 30 ml(s). Minimum 3 ml of urine is needed. Document temperature of fresh sample. Indications: Long term (current) use of opiate analgesic (Z79.891)    Order Specific Question:   Release to patient    Answer:   Immediate     Follow-up plan:   Return in about 3 months (around 08/20/2021) for Medication Management, in person.   Recent Visits Date Type Provider Dept  02/26/21 Office Visit Lateef, Bilal, MD [facility]  Showing recent visits within past 90 days and meeting all other requirements Today's Visits Date Type Provider Dept  05/21/21 Office Visit Lateef, Bilal, MD [facility]  Showing today's visits and meeting all other requirements Future Appointments Date Type Provider Dept  08/15/21 Appointment Lateef, Bilal, MD [facility]  Showing future appointments within next 90 days and meeting all other requirements I discussed the assessment and treatment plan with the patient. The patient was provided an opportunity to ask questions and all were answered. The patient agreed with the plan and demonstrated an  understanding of the instructions.  Patient advised to call back or seek an in-person evaluation if the symptoms or condition worsens.  Duration of encounter:30minutes.  Note by: Bilal Lateef, MD Date: 05/21/2021; Time: 3:10 PM 

## 2021-05-25 LAB — TOXASSURE SELECT 13 (MW), URINE

## 2021-05-28 ENCOUNTER — Telehealth: Payer: Self-pay | Admitting: Internal Medicine

## 2021-05-28 DIAGNOSIS — B029 Zoster without complications: Secondary | ICD-10-CM | POA: Diagnosis not present

## 2021-05-28 NOTE — Telephone Encounter (Signed)
Patient informed, Due to the high volume of calls and your symptoms we have to forward your call to our Triage Nurse to expedient your call. Please hold for the transfer.  Patient transferred to Access Nurse. Due to having a rash on her forehead as well as her eye is swollen and painful that started yesterday.No openings in office or virtual.

## 2021-05-28 NOTE — Telephone Encounter (Signed)
  Called to triage. Pt states that she is currently on her way to the urgent care due to rash along forehead and eyebrows. Providing access nurse documentation. Pt stated that the rash is raised and painful. Rash started 2 days ago along her eye and forehead. Pt was instructed to go to urgent care and is currently on her way.

## 2021-05-28 NOTE — Telephone Encounter (Signed)
Agree with need for evaluation give painful rash face.

## 2021-05-29 DIAGNOSIS — F4312 Post-traumatic stress disorder, chronic: Secondary | ICD-10-CM | POA: Diagnosis not present

## 2021-05-29 DIAGNOSIS — F39 Unspecified mood [affective] disorder: Secondary | ICD-10-CM | POA: Diagnosis not present

## 2021-05-29 DIAGNOSIS — F5105 Insomnia due to other mental disorder: Secondary | ICD-10-CM | POA: Diagnosis not present

## 2021-05-29 DIAGNOSIS — F419 Anxiety disorder, unspecified: Secondary | ICD-10-CM | POA: Diagnosis not present

## 2021-06-10 ENCOUNTER — Other Ambulatory Visit: Payer: Self-pay | Admitting: Student in an Organized Health Care Education/Training Program

## 2021-06-13 DIAGNOSIS — Z20822 Contact with and (suspected) exposure to covid-19: Secondary | ICD-10-CM | POA: Diagnosis not present

## 2021-06-13 DIAGNOSIS — U071 COVID-19: Secondary | ICD-10-CM | POA: Diagnosis not present

## 2021-06-18 ENCOUNTER — Encounter: Payer: Self-pay | Admitting: Internal Medicine

## 2021-06-25 DIAGNOSIS — F4312 Post-traumatic stress disorder, chronic: Secondary | ICD-10-CM | POA: Diagnosis not present

## 2021-06-25 DIAGNOSIS — F5105 Insomnia due to other mental disorder: Secondary | ICD-10-CM | POA: Diagnosis not present

## 2021-06-25 DIAGNOSIS — F39 Unspecified mood [affective] disorder: Secondary | ICD-10-CM | POA: Diagnosis not present

## 2021-06-25 DIAGNOSIS — F419 Anxiety disorder, unspecified: Secondary | ICD-10-CM | POA: Diagnosis not present

## 2021-06-27 DIAGNOSIS — F4312 Post-traumatic stress disorder, chronic: Secondary | ICD-10-CM | POA: Diagnosis not present

## 2021-07-11 DIAGNOSIS — F4312 Post-traumatic stress disorder, chronic: Secondary | ICD-10-CM | POA: Diagnosis not present

## 2021-08-01 DIAGNOSIS — D631 Anemia in chronic kidney disease: Secondary | ICD-10-CM | POA: Diagnosis not present

## 2021-08-01 DIAGNOSIS — N2581 Secondary hyperparathyroidism of renal origin: Secondary | ICD-10-CM | POA: Diagnosis not present

## 2021-08-01 DIAGNOSIS — N1831 Chronic kidney disease, stage 3a: Secondary | ICD-10-CM | POA: Diagnosis not present

## 2021-08-01 DIAGNOSIS — E559 Vitamin D deficiency, unspecified: Secondary | ICD-10-CM | POA: Diagnosis not present

## 2021-08-01 DIAGNOSIS — I10 Essential (primary) hypertension: Secondary | ICD-10-CM | POA: Diagnosis not present

## 2021-08-15 ENCOUNTER — Encounter: Payer: Self-pay | Admitting: Student in an Organized Health Care Education/Training Program

## 2021-08-15 ENCOUNTER — Other Ambulatory Visit: Payer: Self-pay

## 2021-08-15 ENCOUNTER — Ambulatory Visit
Payer: Federal, State, Local not specified - PPO | Attending: Student in an Organized Health Care Education/Training Program | Admitting: Student in an Organized Health Care Education/Training Program

## 2021-08-15 VITALS — BP 130/85 | HR 95 | Temp 96.9°F | Resp 96 | Ht 63.0 in | Wt 164.0 lb

## 2021-08-15 DIAGNOSIS — M1612 Unilateral primary osteoarthritis, left hip: Secondary | ICD-10-CM

## 2021-08-15 DIAGNOSIS — M2550 Pain in unspecified joint: Secondary | ICD-10-CM | POA: Insufficient documentation

## 2021-08-15 DIAGNOSIS — G8929 Other chronic pain: Secondary | ICD-10-CM | POA: Insufficient documentation

## 2021-08-15 DIAGNOSIS — M797 Fibromyalgia: Secondary | ICD-10-CM | POA: Diagnosis not present

## 2021-08-15 DIAGNOSIS — M25511 Pain in right shoulder: Secondary | ICD-10-CM | POA: Diagnosis not present

## 2021-08-15 DIAGNOSIS — L4050 Arthropathic psoriasis, unspecified: Secondary | ICD-10-CM | POA: Diagnosis not present

## 2021-08-15 DIAGNOSIS — A6920 Lyme disease, unspecified: Secondary | ICD-10-CM | POA: Diagnosis not present

## 2021-08-15 DIAGNOSIS — M25512 Pain in left shoulder: Secondary | ICD-10-CM | POA: Diagnosis not present

## 2021-08-15 DIAGNOSIS — M19011 Primary osteoarthritis, right shoulder: Secondary | ICD-10-CM | POA: Diagnosis not present

## 2021-08-15 DIAGNOSIS — G894 Chronic pain syndrome: Secondary | ICD-10-CM | POA: Diagnosis not present

## 2021-08-15 MED ORDER — OXYCODONE-ACETAMINOPHEN 5-325 MG PO TABS: 1.0000 | ORAL_TABLET | Freq: Three times a day (TID) | ORAL | 0 refills | Status: DC | PRN

## 2021-08-15 MED ORDER — TRAMADOL HCL ER 200 MG PO TB24: 200.0000 mg | ORAL_TABLET | Freq: Every day | ORAL | 2 refills | Status: DC

## 2021-08-15 NOTE — Progress Notes (Signed)
Nursing Pain Medication Assessment:  On Ms. Lierman regular appointment, she did not comply with our medication refill policy of bringing her pills and bottle(s) to be examined and counted. As a consequence, her prescriptions were written and held, until she could bring back the medications to be examined. She comes in now to comply with this requirement.  Medication #1: Tramadol (Ultram) Pill/Patch Count:  2 of 30 pills remain Bottle Appearance: Standard pharmacy container. Clearly labeled. Filled Date: 63 / 20 / 2022 Last Medication intake:  Today  Medication #2: Oxycodone/APAP Pill/Patch Count:  1 of 90 pills remain Bottle Appearance: Standard pharmacy container. Clearly labeled. Filled Date: 65 / 12 / 2022 Last Medication intake:  Yesterday  Prescriptions: Written prescriptions to last for 1 month given to patient once pill count and bottle inspection was completed.  Date: 08/15/21; Time: 12:59 PM

## 2021-08-15 NOTE — Progress Notes (Signed)
PROVIDER NOTE: Information contained herein reflects review and annotations entered in association with encounter. Interpretation of such information and data should be left to medically-trained personnel. Information provided to patient can be located elsewhere in the medical record under "Patient Instructions". Document created using STT-dictation technology, any transcriptional errors that may result from process are unintentional.    Patient: Barbara Faulkner  Service Category: E/M  Provider: Gillis Santa, MD  DOB: 19-Mar-1966  DOS: 02/26/2021  Specialty: Interventional Pain Management  MRN: [id number]  Setting: Ambulatory outpatient  PCP: McLean-Scocuzza, Nino Glow, MD  Type: Established Patient    Referring Provider: Orland Mustard *  Location: Office  Delivery: Face-to-face     HPI  [name], a 55 y.o. year old female, is here today because of her Psoriatic arthritis ([place]) [L40.50]. [name] primary complain today is Hip Pain (left), Shoulder Pain (right), and Knee Pain Last encounter: My last encounter with her was on 05/21/21 Pertinent problems: 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 Pain Assessment: Severity of Chronic pain is reported as a 9 /10. Location: Hip Left/radiates around to fron to leg down to knee. Onset: More than a month ago. Quality: Aching, Constant, Sharp. Timing: Constant. Modifying factor(s): medications. Vitals:  height is _0  (1.6 m) and weight is 164 lb (74.4 kg). Her temperature is 96.9 F (36.1 C) (abnormal). Her blood pressure is 130/85 and her pulse is 95. Her respiration is 96 (abnormal) and oxygen saturation is 97%.   Reason for encounter: medication management.     No change in medical history since last visit. Patient continues multimodal pain regimen as prescribed.   Patient is having increased left hip pain.  She had a left ultrasound-guided hip injection in the past with Dr. Candelaria Stagers which she found effective.  She is hoping to repeat this.  I will send her to Dr. Zigmund Daniel with sports medicine for repeat left ultrasound-guided intra-articular hip injection.  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  Instructed to avoid NSAIDs and nephrotoxic medications.  Otherwise refill of tramadol and oxycodone as below.  No change in dose.  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   Pharmacotherapy Assessment   Analgesic: Tramadol 200 mg ER, continue oxycodone 5 mg daily for breakthrough pain.     Monitoring: [place] PMP: PDMP reviewed during this encounter.       Pharmacotherapy: No side-effects or adverse reactions reported. Compliance: No problems identified. Effectiveness: Clinically acceptable.  UDS:  Summary  Date Value Ref Range Status  05/21/2021 Note  Final    Comment:    ==================================================================== [employer] Select 13 (MW) ==================================================================== Test                             Result       Flag       Units  Drug Present and Declared for Prescription Verification   Tramadol                       1367         EXPECTED   ng/mg creat   O-Desmethyltramadol            1186         EXPECTED   ng/mg creat  N-Desmethyltramadol            233          EXPECTED   ng/mg creat    Source of tramadol is a prescription medication. O-desmethyltramadol    and N-desmethyltramadol are expected metabolites of tramadol.  Drug Absent but Declared for Prescription Verification   Oxycodone                      Not Detected UNEXPECTED ng/mg creat ==================================================================== Test                      Result    Flag   Units      Ref Range   Creatinine              194              mg/dL       >=20 ==================================================================== Declared Medications:  The flagging and interpretation on this report are based on the  following declared medications.  Unexpected results may arise from  inaccuracies in the declared medications.   **Note: The testing scope of this panel includes these medications:   Oxycodone (Percocet)  Tramadol (Ultram)   **Note: The testing scope of this panel does not include the  following reported medications:   Acetaminophen (Percocet)  Albuterol (Ventolin HFA)  Cholecalciferol  Diclofenac (Voltaren)  Fluticasone (Flonase)  Gabapentin (Neurontin)  Lamotrigine (Lamictal)  Methocarbamol (Robaxin)  Pantoprazole (Protonix)  Quetiapine (Seroquel)  Rizatriptan (Maxalt) ==================================================================== For clinical consultation, please call [phone]. ====================================================================       ROS  Constitutional: Denies any fever or chills Gastrointestinal: No reported hemesis, hematochezia, vomiting, or acute GI distress Musculoskeletal:   bilateral hip pain left>right Neurological: No reported episodes of acute onset apraxia, aphasia, dysarthria, agnosia, amnesia, paralysis, loss of coordination, or loss of consciousness  Medication Review  Cholecalciferol, QUEtiapine, calcitRIOL, diclofenac sodium, fluticasone, gabapentin, lamoTRIgine, methocarbamol, oxyCODONE-acetaminophen, pantoprazole, rizatriptan, and traMADol  History Review  Allergy: [name] is allergic to shellfish allergy. Drug: [name]  reports no history of drug use. Alcohol:  reports current alcohol use. Tobacco:  reports that she quit smoking about 17 years ago. Her smoking use included cigarettes. She has a 40.00 pack-year smoking history. She has never used smokeless tobacco. Social: [name]  reports that she quit smoking about 17 years ago. Her smoking use  included cigarettes. She has a 40.00 pack-year smoking history. She has never used smokeless tobacco. She reports current alcohol use. She reports that she does not use drugs. Medical:  has a past medical history of Allergy, Anxiety, Chronic kidney disease, Chronic pain, Chronic sinusitis, COVID-19, Depression, Heart murmur, Hiatal hernia, HPV (human papilloma virus) anogenital infection, Macromastia, Migraine, and Psoriatic arthritis ([place]). Surgical: [name]  has a past surgical history that includes Cholecystectomy (2009); Tubal ligation (1997); Laparoscopic gastric banding (2008 ); Gastric bypass; Breast biopsy (Right, 01/28/2018); Colonoscopy with propofol (N/A, 07/19/2018); Esophagogastroduodenoscopy (egd) with propofol (N/A, 07/19/2018); and Breast reduction surgery (Bilateral, 04/03/2020). Family: family history includes HIV in her father; Hepatitis in her maternal uncle; Hyperlipidemia in her mother; Hypertension in her mother; Lung cancer in her maternal grandfather.  Laboratory Chemistry Profile   Renal Lab Results  Component Value Date   BUN 24 (H) 11/22/2020   CREATININE 1.35 (H) 25/42/7062   BCR NOT APPLICABLE 37/62/8315   GFR 45.20 (L) 11/22/2020   GFRAA >60 02/15/2014   GFRNONAA >60 02/15/2014     Hepatic  Lab Results  Component Value Date   AST 58 (H) 12/19/2020   ALT 64 (H) 12/19/2020   ALBUMIN 4.0 12/19/2020   ALKPHOS 193 (H) 12/19/2020   AMYLASE 33 09/25/2014   LIPASE 124 10/04/2013     Electrolytes Lab Results  Component Value Date   NA 140 11/22/2020   K 4.2 11/22/2020   CL 112 11/22/2020   CALCIUM 8.4 11/22/2020   MG 1.9 07/14/2018   PHOS 5.0 (H) 09/25/2014     Bone Lab Results  Component Value Date   VD25OH 11.89 (L) 11/02/2020     Inflammation (CRP: Acute Phase) (ESR: Chronic Phase) Lab Results  Component Value Date   CRP <1.0 03/10/2019   ESRSEDRATE 3 03/10/2019       Note: Above Lab results reviewed.  Recent Imaging Review  US  RENAL CLINICAL DATA:  CKD  EXAM: RENAL / URINARY TRACT ULTRASOUND COMPLETE  COMPARISON:  CT abdomen pelvis 03/10/2019  FINDINGS: Right Kidney:  Renal measurements: 8.6 x 6.1 x 5.1 cm = volume: 141 mL. Echogenicity within normal limits. No mass or hydronephrosis visualized. 3.1 cm simple cyst seen in the mid kidney. Limited visualization of the right kidney.  Left Kidney:  Renal measurements: 8.8 x 6.1 x 4.1 cm = volume: 114 mL. Echogenicity within normal limits. No mass or hydronephrosis visualized. Limited visualization of the left kidney.  Bladder:  Appears normal for degree of bladder distention.  Other:  None.  IMPRESSION: Limited visualization of the kidneys due to shadowing bowel gas. No significant sonographic abnormality identified.  Electronically Signed   By: Miachel Roux M.D.   On: 03/26/2021 14:04 Note: Reviewed        Physical Exam  General appearance: Well nourished, well developed, and well hydrated. In no apparent acute distress Mental status: Alert, oriented x 3 (person, place, & time)       Respiratory: No evidence of acute respiratory distress Eyes: PERLA Vitals: BP 130/85    Pulse 95    Temp (!) 96.9 F (36.1 C)    Resp (!) 96    Ht _0  (1.6 m)    Wt 164 lb (74.4 kg)    LMP 05/04/2014    SpO2 97%    BMI 29.05 kg/m  BMI: Estimated body mass index is 29.05 kg/m as calculated from the following:   Height as of this encounter: _1  (1.6 m).   Weight as of this encounter: 164 lb (74.4 kg). Ideal: Ideal body weight: 52.4 kg (115 lb 8.3 oz) Adjusted ideal body weight: 61.2 kg (134 lb 14.6 oz)    Left hip pain, with radiation into left groin and overlying trochanteric bursa   Assessment   Status Diagnosis  Controlled Having a Flare-up Controlled 1. Psoriatic arthritis ([place])   2. Primary osteoarthritis of left hip   3. Chronic pain of both shoulders   4. 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 Glenohumeral arthritis, right   6. Chronic  right shoulder pain   7. Fibromyalgia   8. Chronic pain syndrome         Plan of Care  [name] has a current medication list which includes the following long-term medication(s): fluticasone, lamotrigine, pantoprazole, quetiapine, rizatriptan, and gabapentin.  Pharmacotherapy (Medications Ordered): Meds ordered this encounter  Medications   traMADol (ULTRAM-ER) 200 MG 24 hr tablet    Sig: Take 1 tablet (200 mg total) by mouth daily.    Dispense:  30 tablet  Refill:  2   oxyCODONE-acetaminophen (PERCOCET) 5-325 MG tablet    Sig: Take 1 tablet by mouth every 8 (eight) hours as needed for severe pain.    Dispense:  90 tablet    Refill:  0    Chronic Pain. (STOP Act - Not applicable). Fill one day early if closed on scheduled refill date.    Percocet prescription as above for quantity 90 to last 3 months  Orders:  Orders Placed This Encounter  Procedures   AMB referral to sports medicine    Referral Priority:   Routine    Referral Type:   Consultation    Referred to Provider:   Montel Culver, MD    Number of Visits Requested:   1  Recommend ultrasound-guided left hip intra-articular steroid injection with Dr. Zigmund Daniel.  If this is not effective consider left diagnostic sacroiliac joint injection under fluoroscopy or possible piriformis TPI under fluoroscopy.   Follow-up plan:   Return in about 3 months (around 11/13/2021) for Medication Management, in person.   Recent Visits Date Type Provider Dept  05/21/21 Office Visit Gillis Santa, MD [facility]  Showing recent visits within past 90 days and meeting all other requirements Today's Visits Date Type Provider Dept  08/15/21 Office Visit Gillis Santa, MD [facility]  Showing today's visits and meeting all other requirements Future Appointments Date Type Provider Dept  11/07/21 Appointment Gillis Santa, MD [facility]  Showing future appointments within next 90 days and  meeting all other requirements I discussed the assessment and treatment plan with the patient. The patient was provided an opportunity to ask questions and all were answered. The patient agreed with the plan and demonstrated an understanding of the instructions.  Patient advised to call back or seek an in-person evaluation if the symptoms or condition worsens.  Duration of encounter:53mnutes.  Note by: BGillis Santa MD Date: 08/15/2021; Time: 1:30 PM

## 2021-08-19 ENCOUNTER — Other Ambulatory Visit: Payer: Self-pay | Admitting: Student in an Organized Health Care Education/Training Program

## 2021-08-19 DIAGNOSIS — M2550 Pain in unspecified joint: Secondary | ICD-10-CM

## 2021-08-19 DIAGNOSIS — M25511 Pain in right shoulder: Secondary | ICD-10-CM

## 2021-08-19 DIAGNOSIS — G894 Chronic pain syndrome: Secondary | ICD-10-CM

## 2021-08-19 DIAGNOSIS — L4050 Arthropathic psoriasis, unspecified: Secondary | ICD-10-CM

## 2021-08-21 ENCOUNTER — Encounter: Payer: Self-pay | Admitting: Oncology

## 2021-08-22 ENCOUNTER — Other Ambulatory Visit: Payer: Self-pay

## 2021-08-22 ENCOUNTER — Encounter: Payer: Self-pay | Admitting: Family Medicine

## 2021-08-22 ENCOUNTER — Ambulatory Visit: Payer: Federal, State, Local not specified - PPO | Admitting: Family Medicine

## 2021-08-22 ENCOUNTER — Ambulatory Visit
Admission: RE | Admit: 2021-08-22 | Discharge: 2021-08-22 | Disposition: A | Discharge status: Home / Self Care | Payer: Federal, State, Local not specified - PPO | Source: Ambulatory Visit | Attending: Family Medicine | Admitting: Family Medicine

## 2021-08-22 ENCOUNTER — Ambulatory Visit
Admission: RE | Admit: 2021-08-22 | Discharge: 2021-08-22 | Disposition: A | Discharge status: Home / Self Care | Payer: Federal, State, Local not specified - PPO | Attending: Family Medicine | Admitting: Family Medicine

## 2021-08-22 VITALS — BP 146/90 | HR 77 | Ht 63.0 in | Wt 167.0 lb

## 2021-08-22 DIAGNOSIS — M722 Plantar fascial fibromatosis: Secondary | ICD-10-CM | POA: Diagnosis not present

## 2021-08-22 DIAGNOSIS — M1612 Unilateral primary osteoarthritis, left hip: Secondary | ICD-10-CM

## 2021-08-22 DIAGNOSIS — G5601 Carpal tunnel syndrome, right upper limb: Secondary | ICD-10-CM

## 2021-08-22 DIAGNOSIS — M79671 Pain in right foot: Secondary | ICD-10-CM | POA: Diagnosis not present

## 2021-08-22 DIAGNOSIS — M25531 Pain in right wrist: Secondary | ICD-10-CM | POA: Diagnosis not present

## 2021-08-22 MED ORDER — DICLOFENAC SODIUM 75 MG PO TBEC: 75.0000 mg | DELAYED_RELEASE_TABLET | Freq: Two times a day (BID) | ORAL | 0 refills | Status: DC

## 2021-08-22 NOTE — Assessment & Plan Note (Signed)
Right-hand-dominant patient with chronic right hand paresthesias, has previously been diagnosed with carpal tunnel syndrome, reports recurrence of similar symptoms.  Has trialed wrist braces sporadically.  Physical examination shows tenderness at the median nerve, equivocal Phalen's and Tinel's.  I have advised x-rays of the right wrist to rule out degenerative component, she is to restart night splint usage, and will be placed on diclofenac 75 mg twice daily until return.  For constant symptomatology, pending x-rays, can consider ultrasound-guided median nerve injection.

## 2021-08-22 NOTE — Assessment & Plan Note (Signed)
Patient with reported known chronic left hip osteoarthritis with recurrence of symptomatology over the past several weeks.  Has had injections in the past.  Pain is localized to the groin and lateral hip with mild radiation to the left knee.  Denies any paresthesias.  Physical examination shows positive FADIR, tenderness at the greater trochanter, gluteus medius musculotendinous junction for the insertion.  Her findings are most consistent with osteoarthritis related arthralgia of the left hip, we will seek for previous x-rays so that we can review the images, additionally she has findings of compensatory changes of the gluteal tendons laterally.  I have advised scheduled diclofenac 75 mg twice daily until return in 2 weeks, will will attempt to obtain the images from the previously obtained x-rays from 02/25/2021, and start home exercises.  For recalcitrant symptomatology, local injections, continue medications, and physical therapy to be considered.

## 2021-08-22 NOTE — Progress Notes (Signed)
Primary Care / Sports Medicine Office Visit  Patient Information:  Patient ID: Barbara Faulkner, female DOB: 13-Oct-1965 Age: 55 y.o. MRN: [id number]   Barbara Faulkner is a pleasant 55 y.o. female presenting with the following:  Chief Complaint  Patient presents with   New Patient (Initial Visit)   Hip Pain    Left; since after having Lyme disease in 2016; X-Ray 02/25/21 with [facility]; has tried heat, ice, massages, and oral pain medications with no relief; 10/10 pain   Foot Pain    Right heel pain; x2 weeks, intermittent plantar fasciitis; 8/10 pain    Patient Active Problem List   Diagnosis Date Noted   Plantar fasciitis 08/22/2021   Carpal tunnel syndrome on right 08/22/2021   Primary osteoarthritis of left hip 08/15/2021   Hyperphosphatemia 08/01/2021   Anemia in chronic kidney disease 04/18/2021   Chronic kidney disease, stage 3a (HCC) 04/18/2021   Hyperparathyroidism due to renal insufficiency (HCC) 04/18/2021   Hypertension 04/18/2021   Hepatic steatosis 11/27/2020   Hiatal hernia 11/02/2020   History of migraine 11/02/2020   Gastroesophageal reflux disease 11/02/2020   Brain fog 11/02/2020   Depression, recurrent (HCC) 03/18/2020   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 09/08/2018   Arthropathy of right shoulder 09/08/2018   Controlled substance agreement signed 09/08/2018   Chronic pain 08/24/2018   Annual physical exam 08/24/2018   Iron deficiency anemia 08/24/2018   Elevated liver enzymes 07/19/2018   Vitamin D deficiency 07/19/2018   Colon cancer screening    Abdominal pain, epigastric 07/08/2018   Right shoulder pain 06/15/2018   Hemorrhoids 05/01/2017   Anxiety and depression 11/24/2016   Back pain 09/11/2016   Large breasts 09/11/2016   Parotiditis 05/01/2016   Abnormal laboratory test result 03/26/2016   Proteinuria 03/26/2016   Right hip pain 04/18/2015   Acute sinusitis 01/08/2014   Psoriatic arthritis (HCC) 09/13/2013   DUB (dysfunctional uterine  bleeding) 10/27/2012   Gastritis due to nonsteroidal anti-inflammatory drug 12/12/2011   Anaphylactic reaction due to shellfish 09/27/2010   THYROMEGALY 09/03/2010   ALLERGIC RHINITIS 03/15/2010   MIGRAINE W/AURA W/O INTRACT W/O STATUS MIGRNOSUS 05/17/2008    Vitals:   08/22/21 1544  BP: (!) 146/90  Pulse: 77  SpO2: 97%   Vitals:   08/22/21 1544  Weight: 167 lb (75.8 kg)  Height: 5\' 3"  (1.6 m)   Body mass index is 29.58 kg/m.  No results found.   Independent interpretation of notes and tests performed by another provider:   None  Procedures performed:   None  Pertinent History, Exam, Impression, and Recommendations:   Plantar fasciitis Atraumatic onset of right heel pain over the past 2 weeks, focal tenderness at the anteromedial component of the calcaneus, plantar component, subtle tenderness at the insertion of the Achilles ipsilaterally.  Findings most consistent with Planter fasciitis, the nature of this condition and treatment strategies reviewed, at this stage she will go on a 2-week course of scheduled diclofenac 75 mg, start heel cup inserts which were provided today, and home exercises.  X-rays of the right foot were ordered as well.  For any residual symptomatology, continue medications versus local injection and/or formal physical therapy to be considered.  Primary osteoarthritis of left hip Patient with reported known chronic left hip osteoarthritis with recurrence of symptomatology over the past several weeks.  Has had injections in the past.  Pain is localized to the groin and lateral hip with mild radiation to the left knee.  Denies any paresthesias.  Physical examination shows positive FADIR, tenderness at the greater trochanter, gluteus medius musculotendinous junction for the insertion.  Her findings are most consistent with osteoarthritis related arthralgia of the left hip, we will seek for previous x-rays so that we can review the images, additionally she  has findings of compensatory changes of the gluteal tendons laterally.  I have advised scheduled diclofenac 75 mg twice daily until return in 2 weeks, will will attempt to obtain the images from the previously obtained x-rays from 02/25/2021, and start home exercises.  For recalcitrant symptomatology, local injections, continue medications, and physical therapy to be considered.  Carpal tunnel syndrome on right Right-hand-dominant patient with chronic right hand paresthesias, has previously been diagnosed with carpal tunnel syndrome, reports recurrence of similar symptoms.  Has trialed wrist braces sporadically.  Physical examination shows tenderness at the median nerve, equivocal Phalen's and Tinel's.  I have advised x-rays of the right wrist to rule out degenerative component, she is to restart night splint usage, and will be placed on diclofenac 75 mg twice daily until return.  For constant symptomatology, pending x-rays, can consider ultrasound-guided median nerve injection.   Orders & Medications Meds ordered this encounter  Medications   diclofenac (VOLTAREN) 75 MG EC tablet    Sig: Take 1 tablet (75 mg total) by mouth 2 (two) times daily.    Dispense:  30 tablet    Refill:  0   Orders Placed This Encounter  Procedures   DG Foot Complete Right   DG Wrist Complete Right     Return in about 2 years (around 08/23/2023) for Follow-up.     Montel Culver, MD   Primary Care Sports Medicine [place]

## 2021-08-22 NOTE — Patient Instructions (Signed)
-   Start diclofenac, take AM and PM scheduled with food (no other NSAIDs while on this) - Use heel cup inserts - Start home exercises - Obtain x-rays - Return in 2 weeks

## 2021-08-22 NOTE — Assessment & Plan Note (Signed)
Atraumatic onset of right heel pain over the past 2 weeks, focal tenderness at the anteromedial component of the calcaneus, plantar component, subtle tenderness at the insertion of the Achilles ipsilaterally.  Findings most consistent with Planter fasciitis, the nature of this condition and treatment strategies reviewed, at this stage she will go on a 2-week course of scheduled diclofenac 75 mg, start heel cup inserts which were provided today, and home exercises.  X-rays of the right foot were ordered as well.  For any residual symptomatology, continue medications versus local injection and/or formal physical therapy to be considered.

## 2021-08-23 ENCOUNTER — Telehealth: Payer: Self-pay

## 2021-08-23 NOTE — Telephone Encounter (Signed)
Spoke with Toniann Fail with [facility] and had X-Ray left hip from 02/25/21 requested through [facility].  Pending images.  For your information.

## 2021-08-28 DIAGNOSIS — F39 Unspecified mood [affective] disorder: Secondary | ICD-10-CM | POA: Diagnosis not present

## 2021-08-28 DIAGNOSIS — F5105 Insomnia due to other mental disorder: Secondary | ICD-10-CM | POA: Diagnosis not present

## 2021-08-28 DIAGNOSIS — F419 Anxiety disorder, unspecified: Secondary | ICD-10-CM | POA: Diagnosis not present

## 2021-08-28 DIAGNOSIS — F4312 Post-traumatic stress disorder, chronic: Secondary | ICD-10-CM | POA: Diagnosis not present

## 2021-09-06 ENCOUNTER — Ambulatory Visit
Admission: RE | Admit: 2021-09-06 | Discharge: 2021-09-06 | Disposition: A | Discharge status: Home / Self Care | Payer: Federal, State, Local not specified - PPO | Source: Ambulatory Visit | Attending: Family Medicine | Admitting: Family Medicine

## 2021-09-06 ENCOUNTER — Other Ambulatory Visit: Payer: Self-pay | Admitting: Internal Medicine

## 2021-09-06 ENCOUNTER — Ambulatory Visit
Admission: RE | Admit: 2021-09-06 | Discharge: 2021-09-06 | Disposition: A | Discharge status: Home / Self Care | Payer: Federal, State, Local not specified - PPO | Attending: Family Medicine | Admitting: Family Medicine

## 2021-09-06 ENCOUNTER — Encounter: Payer: Self-pay | Admitting: Family Medicine

## 2021-09-06 ENCOUNTER — Ambulatory Visit: Payer: Federal, State, Local not specified - PPO | Admitting: Internal Medicine

## 2021-09-06 ENCOUNTER — Ambulatory Visit: Payer: Federal, State, Local not specified - PPO | Admitting: Family Medicine

## 2021-09-06 ENCOUNTER — Inpatient Hospital Stay (INDEPENDENT_AMBULATORY_CARE_PROVIDER_SITE_OTHER): Payer: Federal, State, Local not specified - PPO | Admitting: Radiology

## 2021-09-06 ENCOUNTER — Encounter: Payer: Self-pay | Admitting: Internal Medicine

## 2021-09-06 ENCOUNTER — Other Ambulatory Visit: Payer: Self-pay

## 2021-09-06 VITALS — BP 132/86 | HR 78 | Temp 96.6°F | Ht 63.0 in | Wt 170.0 lb

## 2021-09-06 VITALS — BP 134/88 | HR 85 | Ht 63.0 in | Wt 170.0 lb

## 2021-09-06 DIAGNOSIS — M1612 Unilateral primary osteoarthritis, left hip: Secondary | ICD-10-CM | POA: Diagnosis not present

## 2021-09-06 DIAGNOSIS — M25551 Pain in right hip: Secondary | ICD-10-CM | POA: Diagnosis not present

## 2021-09-06 DIAGNOSIS — G8929 Other chronic pain: Secondary | ICD-10-CM

## 2021-09-06 DIAGNOSIS — Z1211 Encounter for screening for malignant neoplasm of colon: Secondary | ICD-10-CM

## 2021-09-06 DIAGNOSIS — M12811 Other specific arthropathies, not elsewhere classified, right shoulder: Secondary | ICD-10-CM | POA: Diagnosis not present

## 2021-09-06 DIAGNOSIS — Z23 Encounter for immunization: Secondary | ICD-10-CM

## 2021-09-06 DIAGNOSIS — R8271 Bacteriuria: Secondary | ICD-10-CM | POA: Diagnosis not present

## 2021-09-06 DIAGNOSIS — E559 Vitamin D deficiency, unspecified: Secondary | ICD-10-CM

## 2021-09-06 DIAGNOSIS — Z1231 Encounter for screening mammogram for malignant neoplasm of breast: Secondary | ICD-10-CM

## 2021-09-06 DIAGNOSIS — M25511 Pain in right shoulder: Secondary | ICD-10-CM

## 2021-09-06 DIAGNOSIS — N182 Chronic kidney disease, stage 2 (mild): Secondary | ICD-10-CM

## 2021-09-06 DIAGNOSIS — R748 Abnormal levels of other serum enzymes: Secondary | ICD-10-CM

## 2021-09-06 DIAGNOSIS — M722 Plantar fascial fibromatosis: Secondary | ICD-10-CM | POA: Diagnosis not present

## 2021-09-06 DIAGNOSIS — M1611 Unilateral primary osteoarthritis, right hip: Secondary | ICD-10-CM | POA: Diagnosis not present

## 2021-09-06 DIAGNOSIS — M19011 Primary osteoarthritis, right shoulder: Secondary | ICD-10-CM | POA: Diagnosis not present

## 2021-09-06 DIAGNOSIS — G5601 Carpal tunnel syndrome, right upper limb: Secondary | ICD-10-CM | POA: Diagnosis not present

## 2021-09-06 LAB — VITAMIN D 25 HYDROXY (VIT D DEFICIENCY, FRACTURES): VITD: 9.94 ng/mL — ABNORMAL LOW (ref 30.00–100.00)

## 2021-09-06 LAB — COMPREHENSIVE METABOLIC PANEL
ALT: 53 U/L — ABNORMAL HIGH (ref 0–35)
AST: 56 U/L — ABNORMAL HIGH (ref 0–37)
Albumin: 4.1 g/dL (ref 3.5–5.2)
Alkaline Phosphatase: 206 U/L — ABNORMAL HIGH (ref 39–117)
BUN: 19 mg/dL (ref 6–23)
CO2: 19 mEq/L (ref 19–32)
Calcium: 7.8 mg/dL — ABNORMAL LOW (ref 8.4–10.5)
Chloride: 113 mEq/L — ABNORMAL HIGH (ref 96–112)
Creatinine, Ser: 1.07 mg/dL (ref 0.40–1.20)
GFR: 59.41 mL/min — ABNORMAL LOW (ref >60.00)
Glucose, Bld: 77 mg/dL (ref 70–99)
Potassium: 3.9 mEq/L (ref 3.5–5.1)
Sodium: 142 mEq/L (ref 135–145)
Total Bilirubin: 0.5 mg/dL (ref 0.2–1.2)
Total Protein: 6.1 g/dL (ref 6.0–8.3)

## 2021-09-06 MED ORDER — BUPIVACAINE HCL (PF) 0.5 % IJ SOLN
2.0000 mL | Freq: Once | INTRAMUSCULAR | Status: AC
  Administered 2021-09-06: 2 mL

## 2021-09-06 MED ORDER — CHOLECALCIFEROL 1.25 MG (50000 UT) PO CAPS
50000.0000 [IU] | ORAL_CAPSULE | ORAL | 1 refills | Status: DC
Start: 2021-09-06 — End: 2023-04-21

## 2021-09-06 MED ORDER — EPINEPHRINE 0.3 MG/0.3ML IJ SOAJ: 0.3000 mg | Freq: Once | INTRAMUSCULAR | 2 refills | Status: DC | PRN

## 2021-09-06 MED ORDER — LIDOCAINE HCL (PF) 1 % IJ SOLN: 2.0000 mL | Freq: Once | INTRAMUSCULAR | Status: AC

## 2021-09-06 MED ORDER — TRIAMCINOLONE ACETONIDE 40 MG/ML IJ SUSP
40.0000 mg | Freq: Once | INTRAMUSCULAR | Status: AC
  Administered 2021-09-06: 40 mg via INTRAMUSCULAR

## 2021-09-06 NOTE — Assessment & Plan Note (Signed)
Chronic condition with ongoing symptomatology, while she did note improvement while on diclofenac, after discontinuation (held x1 day), rapid recurrence of symptomatology.  Her recent x-rays do demonstrate calcaneal enthesophyte consistent with her stated history and demonstrating an element of chronically tight plantar fascia.  Given her comorbid renal status, we will hold from additional NSAID therapy, additional treatment strategies outlined.  Patient did elect to proceed with ultrasound-guided plantar fascia origin injection with cortisone.  She tolerated the procedure well, postinjection care outlined.  I have advised patient that during the anticipated window of symptom control ideally treatment is focused on home-based rehab with stretching throughout the plantar fascia, Achilles, and gastrocnemius.  I have advised her to start the home exercises provided at her last visit.  She can follow-up on an as-needed basis for this issue.

## 2021-09-06 NOTE — Progress Notes (Signed)
Chief Complaint  Patient presents with   Follow-up   Immunizations   F/u  1. Elevated lfts and ckd 2, 3b last gfr 08/01/21 64 with renal and hbg 10.8 baseline  2. Requesting shingrix today had 06/2021 left forehead to scalp shingles   Review of Systems  Constitutional:  Negative for weight loss.  HENT:  Negative for hearing loss.   Eyes:  Negative for blurred vision.  Respiratory:  Negative for shortness of breath.   Cardiovascular:  Negative for chest pain.  Gastrointestinal:  Negative for abdominal pain and blood in stool.  Genitourinary:  Negative for dysuria.  Musculoskeletal:  Negative for falls and joint pain.  Skin:  Negative for rash.  Neurological:  Negative for headaches.  Psychiatric/Behavioral:  Negative for depression.   Past Medical History:  Diagnosis Date   Allergy    Anxiety    Chronic kidney disease    Chronic pain    Chronic sinusitis    COVID-19    09/14/20, 06/13/21   Depression    Heart murmur    Hiatal hernia    HPV (human papilloma virus) anogenital infection    Macromastia    Migraine    Psoriatic arthritis ([place])    Shingles    Past Surgical History:  Procedure Laterality Date   BREAST BIOPSY Right 01/28/2018   US guided biopsy - heart shaped   BREAST REDUCTION SURGERY Bilateral 04/03/2020   Procedure: MAMMARY REDUCTION  (BREAST);  Surgeon: Contogiannis, Audrea Muscat, MD;  Location: [facility];  Service: Plastics;  Laterality: Bilateral;  HAVE LIPOSUCTION MACHINE AVAILABLE   CHOLECYSTECTOMY  2009   COLONOSCOPY WITH PROPOFOL N/A 07/19/2018   Procedure: COLONOSCOPY WITH PROPOFOL;  Surgeon: Lin Landsman, MD;  Location: [facility] ENDOSCOPY;  Service: Gastroenterology;  Laterality: N/A;   ESOPHAGOGASTRODUODENOSCOPY (EGD) WITH PROPOFOL N/A 07/19/2018   Procedure: ESOPHAGOGASTRODUODENOSCOPY (EGD) WITH PROPOFOL;  Surgeon: Lin Landsman, MD;  Location: [facility] ENDOSCOPY;  Service: Gastroenterology;  Laterality: N/A;   GASTRIC BYPASS      2015; duodenal switch    LAPAROSCOPIC GASTRIC BANDING  2008    removed 2009   TUBAL LIGATION  1997   Family History  Problem Relation Age of Onset   HIV Father    Hypertension Mother    Hyperlipidemia Mother    Lung cancer Maternal Grandfather        smoker   Hepatitis Maternal Uncle        drug use   Colon cancer Neg Hx    Colon polyps Neg Hx    Rectal cancer Neg Hx    Stomach cancer Neg Hx    Breast cancer Neg Hx    Social History   Socioeconomic History   Marital status: Married    Spouse name: Pernell Ensz   Number of children: 2   Years of education: Not on file   Highest education level: Not on file  Occupational History   Occupation: Marine scientist, Economist: [employer]  Tobacco Use   Smoking status: Former    Packs/day: 2.00    Years: 20.00    Pack years: 40.00    Types: Cigarettes    Quit date: 09/02/2003    Years since quitting: 18.0   Smokeless tobacco: Never  Vaping Use   Vaping Use: Never used  Substance and Sexual Activity   Alcohol use: Yes   Drug use: Never   Sexual activity: Yes    Partners: Male    Birth  control/protection: Post-menopausal  Other Topics Concern   Not on file  Social History Narrative   Married.   1 child son died 12/17/2018 due to Suicide, 6 grandchildren   2nd son died 12/17/03 MVA   Works for the [employer].   Enjoys reading and exercising.    Has 2 dogs and 2 cats   Social Determinants of Radio broadcast assistant Strain: Not on file  Food Insecurity: Not on file  Transportation Needs: Not on file  Physical Activity: Not on file  Stress: Not on file  Social Connections: Not on file  Intimate Partner Violence: Not on file   Current Meds  Medication Sig   calcitRIOL (ROCALTROL) 0.25 MCG capsule Take 0.25 mcg by mouth daily.   fluticasone (FLONASE) 50 MCG/ACT nasal spray Place 2 sprays into both nostrils daily. Prn   gabapentin (NEURONTIN) 400 MG capsule Take 1 capsule (400 mg total) by mouth at  bedtime.   methocarbamol (ROBAXIN) 500 MG tablet Take 1 tablet (500 mg total) by mouth 2 (two) times daily as needed for muscle spasms.   oxyCODONE-acetaminophen (PERCOCET) 5-325 MG tablet Take 1 tablet by mouth every 8 (eight) hours as needed for severe pain.   QUEtiapine (SEROQUEL) 100 MG tablet Take 100 mg by mouth at bedtime.   tiZANidine (ZANAFLEX) 4 MG tablet Take 4 mg by mouth 3 (three) times daily.   traMADol (ULTRAM-ER) 200 MG 24 hr tablet Take 1 tablet (200 mg total) by mouth daily.   valACYclovir (VALTREX) 1000 MG tablet Take 1 tablet by mouth 3 (three) times daily.   venlafaxine XR (EFFEXOR-XR) 75 MG 24 hr capsule Take 75 mg by mouth daily.   VITAMIN D PO Take by mouth daily at 12 noon.   [DISCONTINUED] diclofenac (VOLTAREN) 75 MG EC tablet Take 1 tablet (75 mg total) by mouth 2 (two) times daily.   [DISCONTINUED] diclofenac sodium (VOLTAREN) 1 % GEL Apply topically 4 (four) times daily.   [DISCONTINUED] EPINEPHrine 0.3 mg/0.3 mL IJ SOAJ injection Inject 0.3 mg into the muscle once as needed.   Allergies  Allergen Reactions   Shellfish Allergy Swelling    REACTION: throat closing   Nsaids     All NSAIDS -h/o CKD 2-3    No results found for this or any previous visit (from the past 17-Dec-2158 hour(s)). Objective  Body mass index is 30.11 kg/m. Wt Readings from Last 3 Encounters:  09/06/21 170 lb (77.1 kg)  08/22/21 167 lb (75.8 kg)  08/15/21 164 lb (74.4 kg)   Temp Readings from Last 3 Encounters:  09/06/21 (!) 96.6 F (35.9 C) (Temporal)  08/15/21 (!) 96.9 F (36.1 C)  05/21/21 (!) 97.2 F (36.2 C) (Temporal)   BP Readings from Last 3 Encounters:  09/06/21 132/86  08/22/21 (!) 146/90  08/15/21 130/85   Pulse Readings from Last 3 Encounters:  09/06/21 78  08/22/21 77  08/15/21 95    Physical Exam Vitals and nursing note reviewed.  Constitutional:      Appearance: Normal appearance. She is well-developed and well-groomed.  HENT:     Head: Normocephalic and  atraumatic.  Eyes:     Conjunctiva/sclera: Conjunctivae normal.     Pupils: Pupils are equal, round, and reactive to light.  Cardiovascular:     Rate and Rhythm: Normal rate and regular rhythm.     Heart sounds: Normal heart sounds. No murmur heard. Pulmonary:     Effort: Pulmonary effort is normal.     Breath sounds: Normal  breath sounds.  Abdominal:     General: Abdomen is flat. Bowel sounds are normal.     Tenderness: There is no abdominal tenderness.  Musculoskeletal:        General: No tenderness.  Skin:    General: Skin is warm and dry.  Neurological:     General: No focal deficit present.     Mental Status: She is alert and oriented to person, place, and time. Mental status is at baseline.     Cranial Nerves: Cranial nerves 2-12 are intact.     Gait: Gait is intact.  Psychiatric:        Attention and Perception: Attention and perception normal.        Mood and Affect: Mood and affect normal.        Speech: Speech normal.        Behavior: Behavior normal. Behavior is cooperative.        Thought Content: Thought content normal.        Cognition and Memory: Cognition and memory normal.        Judgment: Judgment normal.    Assessment  Plan  Elevated liver enzymes and ckd2/3- Plan: Comprehensive metabolic panel,  Avoid all nsaids   Vitamin D deficiency - Plan: Vitamin D (25 hydroxy)  Bacteria in urine - Plan: Urine Culture  Plantar fasciitis-seeing Dr. Zigmund Daniel today given exercises to do   HM Flu and tdap in 2 weeks  Shingrix 1/2 given today Consider prevnar in 4 months 2/2 pfizer disc booster Tdap utd 04/23/11 Consider shingrix, twinrix in future mild hepatic steatosis   Pap 08/24/18 +HPV negative pap refer to ob/GYN in 08/2019 Dr. Garwin Brothers check to see if went   roi from 2020 pap Dr. Garwin Brothers   abnormal mammogram bx neg necrosis and cystic change due 01/19/19 nl breast exam right axillary lymph node  -mammo 03/11/19 negative  -ordered for 2022 [place] neg  11/27/20  Ordered      EGD/colonoscopy 07/19/18 ext hemorrhoids erosive gastritis bx mild gastritis   referred colonoscopy due    Former smoker quit 15 years ago smoked 10-13 years 1ppd    rec healthy diet and exercise    04/27/19 saw Dr. Amil Amen (rheumatology) initially tx'ed psoriatic arthritis 2014 Dr. Duard Brady took MTX x 2 months w/o help hand Xray no evidence of erosions of bone no tx requreed and f/u pain clinic  Consider paxil fo cymbalta or gabapentin to lyrica which could be more beneficial per note  F/u in 6 months  Psych Dr. Nicolasa Ducking therapy SEL   Provider: Dr. Olivia Mackie McLean-Scocuzza-Internal Medicine

## 2021-09-06 NOTE — Progress Notes (Signed)
Primary Care / Sports Medicine Office Visit  Patient Information:  Patient ID: Barbara Faulkner, female DOB: [birth year] Age: 56 y.o. MRN: [id number]   Barbara Faulkner is a pleasant 56 y.o. female presenting with the following:  Chief Complaint  Patient presents with   Plantar Fasciitis    Right foot, pt felt better while on diclofenac, stopped x 1 day and symptoms returned.   Carpal Tunnel    Right wrist, feels better    left hip pain    Was doing well with diclofenac, stopped x 1 day and symptoms returned    Patient Active Problem List   Diagnosis Date Noted   Plantar fasciitis 08/22/2021   Carpal tunnel syndrome on right 08/22/2021   Primary osteoarthritis of left hip 08/15/2021   Hyperphosphatemia 08/01/2021   Anemia in chronic kidney disease 04/18/2021   Chronic kidney disease, stage 3a ([place]) 04/18/2021   Hyperparathyroidism due to renal insufficiency ([place]) 04/18/2021   Hypertension 04/18/2021   Hepatic steatosis 11/27/2020   Hiatal hernia 11/02/2020   History of migraine 11/02/2020   Gastroesophageal reflux disease 11/02/2020   Brain fog 11/02/2020   Depression, recurrent ([place]) 03/18/2020   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 09/08/2018   Arthropathy of right shoulder 09/08/2018   Controlled substance agreement signed 09/08/2018   Chronic pain 08/24/2018   Annual physical exam 08/24/2018   Iron deficiency anemia 08/24/2018   Elevated liver enzymes 07/19/2018   Vitamin D deficiency 07/19/2018   Colon cancer screening    Abdominal pain, epigastric 07/08/2018   Right shoulder pain 06/15/2018   Hemorrhoids 05/01/2017   Anxiety and depression 11/24/2016   Back pain 09/11/2016   Large breasts 09/11/2016   Parotiditis 05/01/2016   Abnormal laboratory test result 03/26/2016   Proteinuria 03/26/2016   Right hip pain 04/18/2015   Acute sinusitis 01/08/2014   Psoriatic arthritis ([place]) 09/13/2013   DUB (dysfunctional uterine bleeding) 10/27/2012   Gastritis due to  nonsteroidal anti-inflammatory drug 12/12/2011   Anaphylactic reaction due to shellfish 09/27/2010   THYROMEGALY 09/03/2010   ALLERGIC RHINITIS 03/15/2010   MIGRAINE W/AURA W/O INTRACT W/O STATUS MIGRNOSUS 05/17/2008    Vitals:   09/06/21 1453  BP: 134/88  Pulse: 85  SpO2: 95%   Vitals:   09/06/21 1453  Weight: 170 lb (77.1 kg)  Height: 5\' 3"  (1.6 m)   Body mass index is 30.11 kg/m.  DG Wrist Complete Right  Result Date: 08/23/2021 CLINICAL DATA:  Right wrist pain EXAM: RIGHT WRIST - COMPLETE 3+ VIEW COMPARISON:  None. FINDINGS: There is no evidence of fracture or dislocation. There is no evidence of arthropathy or other focal bone abnormality. Soft tissues are unremarkable. IMPRESSION: Negative. Electronically Signed   By: Keane Police D.O.   On: 08/23/2021 15:30   [place] LIMITED JOINT SPACE STRUCTURES LOW RIGHT  Result Date: 09/06/2021 Procedure:  Injection of right plantar fascia origin at the calcaneus under ultrasound guidance. Ultrasound guidance utilized for in plane needle placement, plantar fascia visualized with associated thickening consistent with Planter fasciitis, hypoechoic response surrounding this region noted following injection Samsung HS60 device utilized with permanent recording / reporting. Consent obtained and verified. Skin prepped in a sterile fashion. Ethyl chloride spray for topical local analgesia. Completed without difficulty and tolerated well. Medication: triamcinolone acetonide 40 mg/mL suspension for injection 1 mL total and 2 mL lidocaine 1% without epinephrine utilized for needle placement anesthetic Advised to contact for fevers/chills, erythema, induration, drainage, or persistent bleeding.  DG Foot  Complete Right  Result Date: 08/23/2021 CLINICAL DATA:  Right foot pain EXAM: RIGHT FOOT COMPLETE - 3+ VIEW COMPARISON:  None. FINDINGS: There is no evidence of fracture or dislocation. Bipartite medial sesamoid bone. There is no evidence of arthropathy or  other focal bone abnormality. Plantar calcaneal spurring. Soft tissues are unremarkable. IMPRESSION: No acute osseous abnormality. Electronically Signed   By: Keane Police D.O.   On: 08/23/2021 15:18     Independent interpretation of notes and tests performed by another provider:   Independent interpretation of Right foot x-ray dated 08/23/2021 reveals no significant degenerative changes throughout the visualized foot and ankle, there is anterior calcaneal enthesophyte, no acute osseous process noted.  Independent interpretation of right wrist reveals subtle cortical irregularity consistent with degenerative changes at the STT articulation, no acute osseous processes noted  Procedures performed:   Procedure:  Injection of right plantar fascia origin at the calcaneus under ultrasound guidance. Ultrasound guidance utilized for in plane needle placement, plantar fascia visualized with associated thickening consistent with Planter fasciitis, hypoechoic response surrounding this region noted following injection Samsung HS60 device utilized with permanent recording / reporting. Consent obtained and verified. Skin prepped in a sterile fashion. Ethyl chloride spray for topical local analgesia.  Completed without difficulty and tolerated well. Medication: triamcinolone acetonide 40 mg/mL suspension for injection 1 mL total and 2 mL lidocaine 1% without epinephrine utilized for needle placement anesthetic Advised to contact for fevers/chills, erythema, induration, drainage, or persistent bleeding.   Pertinent History, Exam, Impression, and Recommendations:   Carpal tunnel syndrome on right This is a chronic condition that is currently stable, recent x-rays reveal minimal degenerative changes localized about the radial carpals.  She does have minimal tenderness at that site though this is not in the nature of her prior pain.  Negative Phalen's and Tinel's today.  I have advised regular compliance with  night splints for symptom maintenance.  If symptoms were to recur despite adherence to the above shared plan, ultrasound-guided median nerve injection to be considered.  Primary osteoarthritis of left hip Chronic condition with ongoing symptomatology, unfortunately despite reaching out for hip x-ray images, these were never obtained.  Patient does endorse significant improvement in pain with course of diclofenac however due to renal function, she understands that we cannot continue this medication.  PCP Dr. Terese Door visit note from 09/06/2021 reviewed, adjunct pain control pharmacotherapeutic options such as duloxetine and Lyrica would be beneficial.  Plan to defer to patient's PCP versus pain management for initiation.  I have ordered dedicated left hip x-rays for independent interpretation, if no contraindication to intra-articular injection noted, pending patient's preference for symptom control, next step would be intra-articular left hip corticosteroid injection.  We will await x-ray results and patient is to reach out to both her PCP and her office in regards to medication management and if wishing to proceed with cortisone.  Right shoulder pain X-rays ordered for future visit planning  Right hip pain X-rays ordered for future visit planning  Plantar fasciitis Chronic condition with ongoing symptomatology, while she did note improvement while on diclofenac, after discontinuation (held x1 day), rapid recurrence of symptomatology.  Her recent x-rays do demonstrate calcaneal enthesophyte consistent with her stated history and demonstrating an element of chronically tight plantar fascia.  Given her comorbid renal status, we will hold from additional NSAID therapy, additional treatment strategies outlined.  Patient did elect to proceed with ultrasound-guided plantar fascia origin injection with cortisone.  She tolerated the procedure well, postinjection care outlined.  I have advised patient that  during the anticipated window of symptom control ideally treatment is focused on home-based rehab with stretching throughout the plantar fascia, Achilles, and gastrocnemius.  I have advised her to start the home exercises provided at her last visit.  She can follow-up on an as-needed basis for this issue.   Orders & Medications Meds ordered this encounter  Medications   lidocaine (PF) (XYLOCAINE) 1 % injection 2 mL   bupivacaine (MARCAINE) 0.5 % injection 2 mL   triamcinolone acetonide (KENALOG-40) injection 40 mg   Orders Placed This Encounter  Procedures   [place] LIMITED JOINT SPACE STRUCTURES LOW RIGHT   DG Hip Unilat W OR W/O Pelvis 2-3 Views Left   DG Hip Unilat W OR W/O Pelvis 2-3 Views Right   DG Shoulder Right     Return if symptoms worsen or fail to improve.     Montel Culver, MD   Primary Care Sports Medicine [place]

## 2021-09-06 NOTE — Assessment & Plan Note (Signed)
X-rays ordered for future visit planning

## 2021-09-06 NOTE — Assessment & Plan Note (Signed)
Chronic condition with ongoing symptomatology, unfortunately despite reaching out for hip x-ray images, these were never obtained.  Patient does endorse significant improvement in pain with course of diclofenac however due to renal function, she understands that we cannot continue this medication.  PCP Dr. Judie Grieve visit note from 09/06/2021 reviewed, adjunct pain control pharmacotherapeutic options such as duloxetine and Lyrica would be beneficial.  Plan to defer to patient's PCP versus pain management for initiation.  I have ordered dedicated left hip x-rays for independent interpretation, if no contraindication to intra-articular injection noted, pending patient's preference for symptom control, next step would be intra-articular left hip corticosteroid injection.  We will await x-ray results and patient is to reach out to both her PCP and her office in regards to medication management and if wishing to proceed with cortisone.

## 2021-09-06 NOTE — Assessment & Plan Note (Addendum)
X-rays ordered for future visit planning °

## 2021-09-06 NOTE — Patient Instructions (Addendum)
You have just been given a cortisone injection to reduce pain and inflammation. After the injection you may notice immediate relief of pain as a result of the Lidocaine. It is important to rest the area of the injection for 24 to 48 hours after the injection. There is a possibility of some temporary increased discomfort and swelling for up to 72 hours until the cortisone begins to work. If you do have pain, simply rest the joint and use ice. If you can tolerate over the counter medications, you can try Tylenol for added relief per package instructions. - As above, take relative rest for the next few days then gradual return to full activity - Start home exercises for the right heel/foot - Obtain x-rays - Touch base with your primary care doctor and/or pain management doctor in regards to possibly starting duloxetine and/or Lyrica - We will reach out with x-ray results, can discuss next steps at that time

## 2021-09-06 NOTE — Assessment & Plan Note (Signed)
This is a chronic condition that is currently stable, recent x-rays reveal minimal degenerative changes localized about the radial carpals.  She does have minimal tenderness at that site though this is not in the nature of her prior pain.  Negative Phalen's and Tinel's today.  I have advised regular compliance with night splints for symptom maintenance.  If symptoms were to recur despite adherence to the above shared plan, ultrasound-guided median nerve injection to be considered.

## 2021-09-06 NOTE — Patient Instructions (Addendum)
Topical vitamin D creams  Ambi bleaching cream   Flu vaccine call back in 2 weeks  At 2-6 months after today 2nd shingrix vaccine -come back here Tdap vaccine (with flu) Prevnar pneumonia vaccine w/in the next 4 months    Plantar Fasciitis Plantar fasciitis is a painful foot condition that affects the heel. It occurs when the band of tissue that connects the toes to the heel bone (plantar fascia) becomes irritated. This can happen as the result of exercising too much or doing other repetitive activities (overuse injury). Plantar fasciitis can cause mild irritation to severe pain that makes it difficult to walk or move. The pain is usually worse in the morning after sleeping, or after sitting or lying down for a period of time. Pain may also be worse after long periods of walking or standing. What are the causes? This condition may be caused by: Standing for long periods of time. Wearing shoes that do not have good arch support. Doing activities that put stress on joints (high-impact activities). This includes ballet and exercise that makes your heart beat faster (aerobic exercise), such as running. Being overweight. An abnormal way of walking (gait). Tight muscles in the back of your lower leg (calf). High arches in your feet or flat feet. Starting a new athletic activity. What are the signs or symptoms? The main symptom of this condition is heel pain. Pain may get worse after the following: Taking the first steps after a time of rest, especially in the morning after awakening, or after you have been sitting or lying down for a while. Long periods of standing still. Pain may decrease after 30-45 minutes of activity, such as gentle walking. How is this diagnosed? This condition may be diagnosed based on your medical history, a physical exam, and your symptoms. Your health care provider will check for: A tender area on the bottom of your foot. A high arch in your foot or flat feet. Pain  when you move your foot. Difficulty moving your foot. You may have imaging tests to confirm the diagnosis, such as: X-rays. Ultrasound. MRI. How is this treated? Treatment for plantar fasciitis depends on how severe your condition is. Treatment may include: Rest, ice, pressure (compression), and raising (elevating) the affected foot. This is called RICE therapy. Your health care provider may recommend RICE therapy along with over-the-counter pain medicines to manage your pain. Exercises to stretch your calves and your plantar fascia. A splint that holds your foot in a stretched, upward position while you sleep (night splint). Physical therapy to relieve symptoms and prevent problems in the future. Injections of steroid medicine (cortisone) to relieve pain and inflammation. Stimulating your plantar fascia with electrical impulses (extracorporeal shock wave therapy). This is usually the last treatment option before surgery. Surgery, if other treatments have not worked after 12 months. Follow these instructions at home: Managing pain, stiffness, and swelling  If directed, put ice on the painful area. To do this: Put ice in a plastic bag, or use a frozen bottle of water. Place a towel between your skin and the bag or bottle. Roll the bottom of your foot over the bag or bottle. Do this for 20 minutes, 2-3 times a day. Wear athletic shoes that have air-sole or gel-sole cushions, or try soft shoe inserts that are designed for plantar fasciitis. Elevate your foot above the level of your heart while you are sitting or lying down. Activity Avoid activities that cause pain. Ask your health care provider what  activities are safe for you. Do physical therapy exercises and stretches as told by your health care provider. Try activities and forms of exercise that are easier on your joints (low impact). Examples include swimming, water aerobics, and biking. General instructions Take over-the-counter and  prescription medicines only as told by your health care provider. Wear a night splint while sleeping, if told by your health care provider. Loosen the splint if your toes tingle, become numb, or turn cold and blue. Maintain a healthy weight, or work with your health care provider to lose weight as needed. Keep all follow-up visits. This is important. Contact a health care provider if you have: Symptoms that do not go away with home treatment. Pain that gets worse. Pain that affects your ability to move or do daily activities. Summary Plantar fasciitis is a painful foot condition that affects the heel. It occurs when the band of tissue that connects the toes to the heel bone (plantar fascia) becomes irritated. Heel pain is the main symptom of this condition. It may get worse after exercising too much or standing still for a long time. Treatment varies, but it usually starts with rest, ice, pressure (compression), and raising (elevating) the affected foot. This is called RICE therapy. Over-the-counter medicines can also be used to manage pain. This information is not intended to replace advice given to you by your health care provider. Make sure you discuss any questions you have with your health care provider. Document Revised: 12/05/2019 Document Reviewed: 12/05/2019 Elsevier Patient Education  2022 [place].  Plantar Fasciitis Rehab Ask your health care provider which exercises are safe for you. Do exercises exactly as told by your health care provider and adjust them as directed. It is normal to feel mild stretching, pulling, tightness, or discomfort as you do these exercises. Stop right away if you feel sudden pain or your pain gets worse. Do not begin these exercises until told by your health care provider. Stretching and range-of-motion exercises These exercises warm up your muscles and joints and improve the movement and flexibility of your foot. These exercises also help to relieve  pain. Plantar fascia stretch  Sit with your left / right leg crossed over your opposite knee. Hold your heel with one hand with that thumb near your arch. With your other hand, hold your toes and gently pull them back toward the top of your foot. You should feel a stretch on the base (bottom) of your toes, or the bottom of your foot (plantar fascia), or both. Hold this stretch for__________ seconds. Slowly release your toes and return to the starting position. Repeat __________ times. Complete this exercise __________ times a day. Gastrocnemius stretch, standing This exercise is also called a calf (gastroc) stretch. It stretches the muscles in the back of the upper calf. Stand with your hands against a wall. Extend your left / right leg behind you, and bend your front knee slightly. Keeping your heels on the floor, your toes facing forward, and your back knee straight, shift your weight toward the wall. Do not arch your back. You should feel a gentle stretch in your upper calf. Hold this position for __________ seconds. Repeat __________ times. Complete this exercise __________ times a day. Soleus stretch, standing This exercise is also called a calf (soleus) stretch. It stretches the muscles in the back of the lower calf. Stand with your hands against a wall. Extend your left / right leg behind you, and bend your front knee slightly. Keeping your  heels on the floor and your toes facing forward, bend your back knee and shift your weight slightly over your back leg. You should feel a gentle stretch deep in your lower calf. Hold this position for __________ seconds. Repeat __________ times. Complete this exercise __________ times a day. Gastroc and soleus stretch, standing step This exercise stretches the muscles in the back of the lower leg. These muscles are in the upper calf (gastrocnemius) and the lower calf (soleus). Stand with the ball of your left / right foot on the front of a step. The  ball of your foot is on the walking surface, right under your toes. Keep your other foot firmly on the same step. Hold on to the wall or a railing for balance. Slowly lift your other foot, allowing your body weight to press your heel down over the edge of the front of the step. Keep knee straight and unbent. You should feel a stretch in your calf. Hold this position for __________ seconds. Return both feet to the step. Repeat this exercise with a slight bend in your left / right knee. Repeat __________ times with your left / right knee straight and __________ times with your left / right knee bent. Complete this exercise __________ times a day. Balance exercise This exercise builds your balance and strength control of your arch to help take pressure off your plantar fascia. Single leg stand If this exercise is too easy, you can try it with your eyes closed or while standing on a pillow. Without shoes, stand near a railing or in a doorway. You may hold on to the railing or door frame as needed. Stand on your left / right foot. Keep your big toe down on the floor and lift the arch of your foot. You should feel a stretch across the bottom of your foot and your arch. Do not let your foot roll inward. Hold this position for __________ seconds. Repeat __________ times. Complete this exercise __________ times a day. This information is not intended to replace advice given to you by your health care provider. Make sure you discuss any questions you have with your health care provider. Document Revised: 05/31/2020 Document Reviewed: 05/31/2020 Elsevier Patient Education  [place].  Zoster Vaccine, Recombinant injection What is this medication? ZOSTER VACCINE (ZOS ter vak SEEN) is a vaccine used to reduce the risk of getting shingles. This vaccine is not used to treat shingles or nerve pain from shingles. This medicine may be used for other purposes; ask your health care provider or pharmacist  if you have questions. COMMON BRAND NAME(S): [facility] What should I tell my care team before I take this medication? They need to know if you have any of these conditions: cancer immune system problems an unusual or allergic reaction to Zoster vaccine, other medications, foods, dyes, or preservatives pregnant or trying to get pregnant breast-feeding How should I use this medication? This vaccine is injected into a muscle. It is given by a health care provider. A copy of Vaccine Information Statements will be given before each vaccination. Be sure to read this information carefully each time. This sheet may change often. Talk to your health care provider about the use of this vaccine in children. This vaccine is not approved for use in children. Overdosage: If you think you have taken too much of this medicine contact a poison control center or emergency room at once. NOTE: This medicine is only for you. Do not share this medicine  with others. What if I miss a dose? Keep appointments for follow-up (booster) doses. It is important not to miss your dose. Call your health care provider if you are unable to keep an appointment. What may interact with this medication? medicines that suppress your immune system medicines to treat cancer steroid medicines like prednisone or cortisone This list may not describe all possible interactions. Give your health care provider a list of all the medicines, herbs, non-prescription drugs, or dietary supplements you use. Also tell them if you smoke, drink alcohol, or use illegal drugs. Some items may interact with your medicine. What should I watch for while using this medication? Visit your health care provider regularly. This vaccine, like all vaccines, may not fully protect everyone. What side effects may I notice from receiving this medication? Side effects that you should report to your doctor or health care professional as soon as possible: allergic  reactions (skin rash, itching or hives; swelling of the face, lips, or tongue) trouble breathing Side effects that usually do not require medical attention (report these to your doctor or health care professional if they continue or are bothersome): chills headache fever nausea pain, redness, or irritation at site where injected tiredness vomiting This list may not describe all possible side effects. Call your doctor for medical advice about side effects. You may report side effects to FDA at 1-800-FDA-1088. Where should I keep my medication? This vaccine is only given by a health care provider. It will not be stored at home. NOTE: This sheet is a summary. It may not cover all possible information. If you have questions about this medicine, talk to your doctor, pharmacist, or health care provider.  2022 Elsevier/Gold Standard (2021-05-07 00:00:00)

## 2021-09-07 ENCOUNTER — Other Ambulatory Visit: Payer: Self-pay | Admitting: Internal Medicine

## 2021-09-07 ENCOUNTER — Other Ambulatory Visit: Payer: Self-pay | Admitting: Student in an Organized Health Care Education/Training Program

## 2021-09-07 DIAGNOSIS — M25511 Pain in right shoulder: Secondary | ICD-10-CM

## 2021-09-07 DIAGNOSIS — L4050 Arthropathic psoriasis, unspecified: Secondary | ICD-10-CM

## 2021-09-07 DIAGNOSIS — A6920 Lyme disease, unspecified: Secondary | ICD-10-CM

## 2021-09-07 DIAGNOSIS — G8929 Other chronic pain: Secondary | ICD-10-CM

## 2021-09-07 DIAGNOSIS — G894 Chronic pain syndrome: Secondary | ICD-10-CM

## 2021-09-07 DIAGNOSIS — M2550 Pain in unspecified joint: Secondary | ICD-10-CM

## 2021-09-08 ENCOUNTER — Encounter: Payer: Self-pay | Admitting: Family Medicine

## 2021-09-08 ENCOUNTER — Other Ambulatory Visit: Payer: Self-pay | Admitting: Family Medicine

## 2021-09-08 LAB — URINE CULTURE
MICRO NUMBER:: 12837766
SPECIMEN QUALITY:: ADEQUATE

## 2021-09-09 ENCOUNTER — Encounter: Payer: Self-pay | Admitting: Internal Medicine

## 2021-09-09 ENCOUNTER — Other Ambulatory Visit: Payer: Self-pay | Admitting: Internal Medicine

## 2021-09-09 ENCOUNTER — Telehealth: Payer: Self-pay | Admitting: Internal Medicine

## 2021-09-09 ENCOUNTER — Other Ambulatory Visit: Payer: Self-pay

## 2021-09-09 DIAGNOSIS — Z1211 Encounter for screening for malignant neoplasm of colon: Secondary | ICD-10-CM

## 2021-09-09 DIAGNOSIS — N3000 Acute cystitis without hematuria: Secondary | ICD-10-CM

## 2021-09-09 MED ORDER — CIPROFLOXACIN HCL 500 MG PO TABS: 500.0000 mg | ORAL_TABLET | Freq: Two times a day (BID) | ORAL | 0 refills | Status: AC

## 2021-09-09 MED ORDER — CLENPIQ 10-3.5-12 MG-GM -GM/160ML PO SOLN: 1.0000 | Freq: Once | ORAL | 0 refills | Status: AC

## 2021-09-09 NOTE — Telephone Encounter (Signed)
Tilford Pillar, CMA  09/09/2021  2:15 PM EST Back to Top    Patient informed and verbalized understanding.    Will pick up antibiotic    Tilford Pillar, CMA  09/09/2021  2:15 PM EST     Patient informed and verbalized understanding.    Patient will start antibiotic, Vitamin D3, and calcium.

## 2021-09-09 NOTE — Progress Notes (Signed)
Gastroenterology Pre-Procedure Review  Request Date: 09/17/2021 Requesting Physician: Dr. Marius Ditch  PATIENT REVIEW QUESTIONS: The patient responded to the following health history questions as indicated:    1. Are you having any GI issues? no 2. Do you have a personal history of Polyps? no 3. Do you have a family history of Colon Cancer or Polyps? no 4. Diabetes Mellitus? no 5. Joint replacements in the past 12 months?no 6. Major health problems in the past 3 months?yes (chronic kidney disease Stage 3.) 7. Any artificial heart valves, MVP, or defibrillator?no    MEDICATIONS & ALLERGIES:    Patient reports the following regarding taking any anticoagulation/antiplatelet therapy:   Plavix, Coumadin, Eliquis, Xarelto, Lovenox, Pradaxa, Brilinta, or Effient? no Aspirin? no  Patient confirms/reports the following medications:  Current Outpatient Medications  Medication Sig Dispense Refill   calcitRIOL (ROCALTROL) 0.25 MCG capsule Take 0.25 mcg by mouth daily.     Cholecalciferol 1.25 MG (50000 UT) capsule Take 1 capsule (50,000 Units total) by mouth once a week. D3 13 capsule 1   ciprofloxacin (CIPRO) 500 MG tablet Take 1 tablet (500 mg total) by mouth 2 (two) times daily for 5 days. With food 10 tablet 0   EPINEPHrine 0.3 mg/0.3 mL IJ SOAJ injection Inject 0.3 mg into the muscle once as needed. 1 each 2   fluticasone (FLONASE) 50 MCG/ACT nasal spray Place 2 sprays into both nostrils daily. Prn 16 g 6   gabapentin (NEURONTIN) 400 MG capsule Take 1 capsule (400 mg total) by mouth at bedtime. 30 capsule 2   methocarbamol (ROBAXIN) 500 MG tablet Take 1 tablet (500 mg total) by mouth 2 (two) times daily as needed for muscle spasms. 60 tablet 0   oxyCODONE-acetaminophen (PERCOCET) 5-325 MG tablet Take 1 tablet by mouth every 8 (eight) hours as needed for severe pain. 90 tablet 0   QUEtiapine (SEROQUEL) 100 MG tablet Take 100 mg by mouth at bedtime.     tiZANidine (ZANAFLEX) 4 MG tablet Take 4 mg by  mouth 3 (three) times daily.     traMADol (ULTRAM-ER) 200 MG 24 hr tablet Take 1 tablet (200 mg total) by mouth daily. 30 tablet 2   valACYclovir (VALTREX) 1000 MG tablet Take 1 tablet by mouth 3 (three) times daily.     venlafaxine XR (EFFEXOR-XR) 75 MG 24 hr capsule Take 75 mg by mouth daily.     VITAMIN D PO Take by mouth daily at 12 noon.     Current Facility-Administered Medications  Medication Dose Route Frequency Provider Last Rate Last Admin   lidocaine (PF) (XYLOCAINE) 1 % injection 2 mL  2 mL Intradermal Once Montel Culver, MD        Patient confirms/reports the following allergies:  Allergies  Allergen Reactions   Shellfish Allergy Swelling    REACTION: throat closing   Nsaids     All NSAIDS -h/o CKD 2-3     No orders of the defined types were placed in this encounter.   AUTHORIZATION INFORMATION Primary Insurance: 1D#: Group #:  Secondary Insurance: 1D#: Group #:  SCHEDULE INFORMATION: Date: 09/17/2021 Time: Location: [place]

## 2021-09-09 NOTE — Telephone Encounter (Signed)
See result note. Patient informed and verbalized understanding.

## 2021-09-09 NOTE — Telephone Encounter (Signed)
GFR slightly reduced from 62 08/01/21 to 59.41 avoid nsaids I.e voltaren as discussed  Liver enzymes still elevated  Please follow up with GI Dr. Allen Norris about this  Calcium is low consider taking multivitamin with calcium daily I.e nature made  Vitamin D low-sent weekly D3 to pharmacy 1x per week  Urine +E coli UTI sent cipro 2x per day with food to [facility] x 5 days

## 2021-09-10 NOTE — Telephone Encounter (Signed)
I believe the pain clinic fills her muscle relaxants

## 2021-09-10 NOTE — Telephone Encounter (Signed)
Last OV 09/06/21 okay to fill Zanaflex?

## 2021-09-15 ENCOUNTER — Other Ambulatory Visit: Payer: Self-pay | Admitting: Student in an Organized Health Care Education/Training Program

## 2021-09-15 DIAGNOSIS — G894 Chronic pain syndrome: Secondary | ICD-10-CM

## 2021-09-15 DIAGNOSIS — M25512 Pain in left shoulder: Secondary | ICD-10-CM

## 2021-09-15 DIAGNOSIS — L4050 Arthropathic psoriasis, unspecified: Secondary | ICD-10-CM

## 2021-09-15 DIAGNOSIS — M2550 Pain in unspecified joint: Secondary | ICD-10-CM

## 2021-09-15 DIAGNOSIS — A6920 Lyme disease, unspecified: Secondary | ICD-10-CM

## 2021-09-15 DIAGNOSIS — G8929 Other chronic pain: Secondary | ICD-10-CM

## 2021-09-16 ENCOUNTER — Encounter: Payer: Self-pay | Admitting: Family Medicine

## 2021-09-16 ENCOUNTER — Other Ambulatory Visit: Payer: Self-pay

## 2021-09-16 ENCOUNTER — Ambulatory Visit (INDEPENDENT_AMBULATORY_CARE_PROVIDER_SITE_OTHER): Payer: Federal, State, Local not specified - PPO | Admitting: Family Medicine

## 2021-09-16 ENCOUNTER — Inpatient Hospital Stay (INDEPENDENT_AMBULATORY_CARE_PROVIDER_SITE_OTHER): Payer: Federal, State, Local not specified - PPO | Admitting: Radiology

## 2021-09-16 VITALS — BP 118/82 | HR 61 | Ht 63.0 in | Wt 166.0 lb

## 2021-09-16 DIAGNOSIS — M1611 Unilateral primary osteoarthritis, right hip: Secondary | ICD-10-CM | POA: Insufficient documentation

## 2021-09-16 DIAGNOSIS — M1612 Unilateral primary osteoarthritis, left hip: Secondary | ICD-10-CM

## 2021-09-16 MED ORDER — TRIAMCINOLONE ACETONIDE 40 MG/ML IJ SUSP
80.0000 mg | Freq: Once | INTRAMUSCULAR | Status: AC
  Administered 2021-09-16: 80 mg via INTRAMUSCULAR

## 2021-09-16 NOTE — Assessment & Plan Note (Signed)
Chronic condition with exacerbation.  Patient has noted recent flare of left hip symptoms, posterior to anterior in a circumferential distribution without radiation distally, no paresthesias.  This pain is brought upon by weightbearing, denies any specific change in activity or traumatic onset for this current episode ongoing for the past several days.  Examination shows limited internal greater than external rotation, positive FADIR, equivocal FABER, minimally tender to the greater trochanter, nontender at the SI joint and throughout the gluteus medius distribution.  Given her recent x-rays and clinical findings, her pain is stemming from underlying osteoarthritis related arthralgia of the left hip.  Given her relative comorbid risk factors and contraindications to NSAIDs I did discuss additional treatment strategies and she did elect to proceed with ultrasound-guided left hip joint injection.  She tolerated this well, post care reviewed, and she is to start home exercises for further advance and maintenance of symptom control.  She can follow-up on an as-needed basis.

## 2021-09-16 NOTE — Patient Instructions (Signed)
You have just been given a cortisone injection to reduce pain and inflammation. After the injection you may notice immediate relief of pain as a result of the Lidocaine. It is important to rest the area of the injection for 24 to 48 hours after the injection. There is a possibility of some temporary increased discomfort and swelling for up to 72 hours until the cortisone begins to work. If you do have pain, simply rest the joint and use ice. If you can tolerate over the counter medications, you can try Tylenol for added relief per package instructions. - Conduct relative rest for the next 2 days then gradual return to full activity - Start home exercises with information provided for both hips - Contact us for questions and follow-up as needed

## 2021-09-16 NOTE — Progress Notes (Signed)
°  ° °  Primary Care / Sports Medicine Office Visit  Patient Information:  Patient ID: Barbara Faulkner, female DOB: 02/11/1966 Age: 56 y.o. MRN: [id number]   Barbara Faulkner is a pleasant 56 y.o. female presenting with the following:  Chief Complaint  Patient presents with   Hip Pain    Injection,left hip,  10 pain level, hurting more     Vitals:   09/16/21 1408  BP: 118/82  Pulse: 61  SpO2: 96%   Vitals:   09/16/21 1408  Weight: 166 lb (75.3 kg)  Height: 5\' 3"  (1.6 m)   Body mass index is 29.41 kg/m.     Independent interpretation of notes and tests performed by another provider:   Independent interpretation of bilateral hip x-rays dated 09/06/2021 revealed moderate right hip osteoarthritis and left moderate to severe hip osteoarthritis with bilateral subchondral sclerosis and apparent degenerative subchondral cysts, left greater than right, right greater than left inferior SI joint space narrowing/osteoarthritis, no acute osseous processes noted  Procedures performed:   Procedure:  Injection of left hip joint under ultrasound guidance. Ultrasound guidance utilized for in plane approach to the left hip, the femoral head neck junction was visualized and targeted, no significant joint effusion was seen, hypoechoic response from injectate visualized  Samsung HS60 device utilized with permanent recording / reporting. Verbal informed consent obtained and verified. Skin prepped in a sterile fashion. Ethyl chloride for topical local analgesia.  Completed without difficulty and tolerated well. Medication: triamcinolone acetonide 40 mg/mL suspension for injection 1 mL total and 2 mL lidocaine 1% without epinephrine utilized for needle placement anesthetic Advised to contact for fevers/chills, erythema, induration, drainage, or persistent bleeding.   Pertinent History, Exam, Impression, and Recommendations:   Primary osteoarthritis of left hip Chronic condition with exacerbation.   Patient has noted recent flare of left hip symptoms, posterior to anterior in a circumferential distribution without radiation distally, no paresthesias.  This pain is brought upon by weightbearing, denies any specific change in activity or traumatic onset for this current episode ongoing for the past several days.  Examination shows limited internal greater than external rotation, positive FADIR, equivocal FABER, minimally tender to the greater trochanter, nontender at the SI joint and throughout the gluteus medius distribution.  Given her recent x-rays and clinical findings, her pain is stemming from underlying osteoarthritis related arthralgia of the left hip.  Given her relative comorbid risk factors and contraindications to NSAIDs I did discuss additional treatment strategies and she did elect to proceed with ultrasound-guided left hip joint injection.  She tolerated this well, post care reviewed, and she is to start home exercises for further advance and maintenance of symptom control.  She can follow-up on an as-needed basis.    Orders & Medications No orders of the defined types were placed in this encounter.  Orders Placed This Encounter  Procedures   11/04/2021 LIMITED JOINT SPACE STRUCTURES LOW LEFT     Return if symptoms worsen or fail to improve.     [place], MD   Primary Care Sports Medicine [facility]

## 2021-09-17 ENCOUNTER — Ambulatory Visit
Admission: RE | Admit: 2021-09-17 | Discharge: 2021-09-17 | Disposition: A | Discharge status: Home / Self Care | Payer: Federal, State, Local not specified - PPO | Attending: Gastroenterology | Admitting: Gastroenterology

## 2021-09-17 ENCOUNTER — Encounter: Payer: Self-pay | Admitting: Gastroenterology

## 2021-09-17 ENCOUNTER — Encounter
Admission: RE | Disposition: A | Discharge status: Home / Self Care | Payer: Self-pay | Source: Home / Self Care | Attending: Gastroenterology

## 2021-09-17 ENCOUNTER — Ambulatory Visit: Payer: Federal, State, Local not specified - PPO | Admitting: Anesthesiology

## 2021-09-17 ENCOUNTER — Other Ambulatory Visit: Payer: Self-pay

## 2021-09-17 DIAGNOSIS — Z8616 Personal history of COVID-19: Secondary | ICD-10-CM | POA: Diagnosis not present

## 2021-09-17 DIAGNOSIS — Z1211 Encounter for screening for malignant neoplasm of colon: Secondary | ICD-10-CM | POA: Insufficient documentation

## 2021-09-17 DIAGNOSIS — N189 Chronic kidney disease, unspecified: Secondary | ICD-10-CM | POA: Diagnosis not present

## 2021-09-17 DIAGNOSIS — F419 Anxiety disorder, unspecified: Secondary | ICD-10-CM | POA: Insufficient documentation

## 2021-09-17 DIAGNOSIS — F32A Depression, unspecified: Secondary | ICD-10-CM | POA: Insufficient documentation

## 2021-09-17 DIAGNOSIS — Z79899 Other long term (current) drug therapy: Secondary | ICD-10-CM | POA: Insufficient documentation

## 2021-09-17 DIAGNOSIS — Z9884 Bariatric surgery status: Secondary | ICD-10-CM | POA: Diagnosis not present

## 2021-09-17 DIAGNOSIS — Z87891 Personal history of nicotine dependence: Secondary | ICD-10-CM | POA: Insufficient documentation

## 2021-09-17 HISTORY — PX: COLONOSCOPY WITH PROPOFOL: SHX5780

## 2021-09-17 SURGERY — COLONOSCOPY WITH PROPOFOL
Anesthesia: General

## 2021-09-17 MED ORDER — PROPOFOL 10 MG/ML IV BOLUS
INTRAVENOUS | Status: DC | PRN
  Administered 2021-09-17: 30 mg via INTRAVENOUS
  Administered 2021-09-17: 70 mg via INTRAVENOUS

## 2021-09-17 MED ORDER — SODIUM CHLORIDE 0.9 % IV SOLN: INTRAVENOUS | Status: DC

## 2021-09-17 MED ORDER — GOLYTELY 236 G PO SOLR: 4.0000 L | Freq: Once | ORAL | 0 refills | Status: AC

## 2021-09-17 MED ORDER — LIDOCAINE HCL (CARDIAC) PF 100 MG/5ML IV SOSY
PREFILLED_SYRINGE | INTRAVENOUS | Status: DC | PRN
  Administered 2021-09-17: 100 mg via INTRAVENOUS

## 2021-09-17 MED ORDER — PROPOFOL 500 MG/50ML IV EMUL
INTRAVENOUS | Status: DC | PRN
  Administered 2021-09-17: 175 ug/kg/min via INTRAVENOUS

## 2021-09-17 NOTE — H&P (Signed)
Cephas Darby, MD [address]  [place],  [address]  Main: [phone]  Fax: [phone] Pager: [phone]  Primary Care Physician:  McLean-Scocuzza, Nino Glow, MD Primary Gastroenterologist:  Dr. Cephas Darby  Pre-Procedure History & Physical: HPI:  Barbara Faulkner is a 56 y.o. female is here for an colonoscopy.   Past Medical History:  Diagnosis Date   Allergy    Anxiety    Chronic kidney disease    Chronic pain    Chronic sinusitis    COVID-19    09/14/20, 06/13/21   Depression    Heart murmur    Hiatal hernia    HPV (human papilloma virus) anogenital infection    Macromastia    Migraine    Psoriatic arthritis ([place])    Shingles    UTI (urinary tract infection)    ecoli 09/2021    Past Surgical History:  Procedure Laterality Date   BREAST BIOPSY Right 01/28/2018   US guided biopsy - heart shaped   BREAST REDUCTION SURGERY Bilateral 04/03/2020   Procedure: MAMMARY REDUCTION  (BREAST);  Surgeon: Contogiannis, Audrea Muscat, MD;  Location: [place];  Service: Plastics;  Laterality: Bilateral;  HAVE LIPOSUCTION MACHINE AVAILABLE   CHOLECYSTECTOMY  2009   COLONOSCOPY WITH PROPOFOL N/A 07/19/2018   Procedure: COLONOSCOPY WITH PROPOFOL;  Surgeon: Lin Landsman, MD;  Location: [facility] ENDOSCOPY;  Service: Gastroenterology;  Laterality: N/A;   ESOPHAGOGASTRODUODENOSCOPY (EGD) WITH PROPOFOL N/A 07/19/2018   Procedure: ESOPHAGOGASTRODUODENOSCOPY (EGD) WITH PROPOFOL;  Surgeon: Lin Landsman, MD;  Location: [facility] ENDOSCOPY;  Service: Gastroenterology;  Laterality: N/A;   GASTRIC BYPASS     2015; duodenal switch    LAPAROSCOPIC GASTRIC BANDING  2008    removed 2009   [place]    Prior to Admission medications   Medication Sig Start Date End Date Taking? Authorizing Provider  calcitRIOL (ROCALTROL) 0.25 MCG capsule Take 0.25 mcg by mouth daily. 04/18/21 08/01/22 Yes [provider]  Cholecalciferol 1.25 MG  (50000 UT) capsule Take 1 capsule (50,000 Units total) by mouth once a week. D3 09/06/21  Yes McLean-Scocuzza, Nino Glow, MD  lamoTRIgine (LAMICTAL) 150 MG tablet Take 150 mg by mouth daily. 08/28/21  Yes [provider]  oxyCODONE-acetaminophen (PERCOCET) 5-325 MG tablet Take 1 tablet by mouth every 8 (eight) hours as needed for severe pain. 08/15/21 11/13/21 Yes Gillis Santa, MD  QUEtiapine (SEROQUEL) 100 MG tablet Take 100 mg by mouth at bedtime. 08/03/19  Yes [provider]  traMADol (ULTRAM-ER) 200 MG 24 hr tablet Take 1 tablet (200 mg total) by mouth daily. 08/15/21 11/13/21 Yes Gillis Santa, MD  venlafaxine XR (EFFEXOR-XR) 75 MG 24 hr capsule Take 75 mg by mouth daily. 08/11/21  Yes [provider]  VITAMIN D PO Take by mouth daily at 12 noon.   Yes [provider]  EPINEPHrine 0.3 mg/0.3 mL IJ SOAJ injection Inject 0.3 mg into the muscle once as needed. Patient not taking: Reported on 09/17/2021 09/06/21   McLean-Scocuzza, Nino Glow, MD  fluticasone [facility]) 50 MCG/ACT nasal spray Place 2 sprays into both nostrils daily. Prn Patient not taking: Reported on 09/17/2021 10/21/18   McLean-Scocuzza, Nino Glow, MD    Allergies as of 09/09/2021 - Review Complete 09/06/2021  Allergen Reaction Noted   Shellfish allergy Swelling 09/27/2010   Nsaids  09/06/2021    Family History  Problem Relation Age of Onset   HIV Father    Hypertension Mother  Hyperlipidemia Mother    Lung cancer Maternal Grandfather        smoker   Hepatitis Maternal Uncle        drug use   Colon cancer Neg Hx    Colon polyps Neg Hx    Rectal cancer Neg Hx    Stomach cancer Neg Hx    Breast cancer Neg Hx     Social History   Socioeconomic History   Marital status: Married    Spouse name: Pernell Tomczak   Number of children: 2   Years of education: Not on file   Highest education level: Not on file  Occupational History   Occupation: Marine scientist, Economist: [employer]  Tobacco Use   Smoking status: Former    Packs/day: 2.00    Years: 20.00    Pack years: 40.00    Types: Cigarettes    Quit date: 09/02/2003    Years since quitting: 18.0   Smokeless tobacco: Never  Vaping Use   Vaping Use: Never used  Substance and Sexual Activity   Alcohol use: Not Currently   Drug use: Never   Sexual activity: Yes    Partners: Male    Birth control/protection: Post-menopausal  Other Topics Concern   Not on file  Social History Narrative   Married.   1 child son died 12/31/2018 due to Suicide, 6 grandchildren   2nd son died 12/31/03 MVA   Works for the post [employer].   Enjoys reading and exercising.    Has 2 dogs and 2 cats   Social Determinants of Radio broadcast assistant Strain: Not on file  Food Insecurity: Not on file  Transportation Needs: Not on file  Physical Activity: Not on file  Stress: Not on file  Social Connections: Not on file  Intimate Partner Violence: Not on file    Review of Systems: See HPI, otherwise negative ROS  Physical Exam: BP (!) 127/91    Pulse 73    Temp (!) 96.4 F (35.8 C) (Temporal)    Resp 20    Ht 5\' 3"  (1.6 m)    Wt 73.9 kg    LMP 05/04/2014    SpO2 99%    BMI 28.87 kg/m  General:   Alert,  pleasant and cooperative in NAD Head:  Normocephalic and atraumatic. Neck:  Supple; no masses or thyromegaly. Lungs:  Clear throughout to auscultation.    Heart:  Regular rate and rhythm. Abdomen:  Soft, nontender and nondistended. Normal bowel sounds, without guarding, and without rebound.   Neurologic:  Alert and  oriented x4;  grossly normal neurologically.  Impression/Plan: Barbara Faulkner is here for an colonoscopy to be performed for colon cancer screening  Risks, benefits, limitations, and alternatives regarding  colonoscopy have been reviewed with the patient.  Questions have been answered.  All parties agreeable.   Sherri Sear, MD  09/17/2021, 7:51 AM

## 2021-09-17 NOTE — Op Note (Signed)
[facility] Gastroenterology Patient Name: Barbara Faulkner Procedure Date: 09/17/2021 7:20 AM MRN: [id number] Account #: [id number] Date of Birth: September 18, 1965 Admit Type: Outpatient Age: 56 Room: [facility] ENDO ROOM 3 Gender: Female Note Status: Finalized Instrument Name: Colonoscope [id number] Procedure:             Colonoscopy Indications:           Screening for colorectal malignant neoplasm, Last                         colonoscopy: November 2019 Providers:             Lin Landsman MD, MD Medicines:             General Anesthesia Complications:         No immediate complications. Estimated blood loss: None. Procedure:             Pre-Anesthesia Assessment:                        - Prior to the procedure, a History and Physical was                         performed, and patient medications and allergies were                         reviewed. The patient is competent. The risks and                         benefits of the procedure and the sedation options and                         risks were discussed with the patient. All questions                         were answered and informed consent was obtained.                         Patient identification and proposed procedure were                         verified by the physician, the nurse, the                         anesthesiologist, the anesthetist and the technician                         in the pre-procedure area in the procedure room in the                         endoscopy suite. Mental Status Examination: alert and                         oriented. Airway Examination: normal oropharyngeal                         airway and neck mobility. Respiratory Examination:                         clear to auscultation. CV Examination:  normal.                         Prophylactic Antibiotics: The patient does not require                         prophylactic antibiotics. Prior Anticoagulants: The                          patient has taken no previous anticoagulant or                         antiplatelet agents. ASA Grade Assessment: II - A                         patient with mild systemic disease. After reviewing                         the risks and benefits, the patient was deemed in                         satisfactory condition to undergo the procedure. The                         anesthesia plan was to use general anesthesia.                         Immediately prior to administration of medications,                         the patient was re-assessed for adequacy to receive                         sedatives. The heart rate, respiratory rate, oxygen                         saturations, blood pressure, adequacy of pulmonary                         ventilation, and response to care were monitored                         throughout the procedure. The physical status of the                         patient was re-assessed after the procedure.                        After obtaining informed consent, the colonoscope was                         passed under direct vision. Throughout the procedure,                         the patient's blood pressure, pulse, and oxygen                         saturations were monitored continuously. The  Colonoscope was introduced through the anus with the                         intention of advancing to the cecum. The scope was                         advanced to the sigmoid colon before the procedure was                         aborted. Medications were given. The colonoscopy was                         extremely difficult due to poor bowel prep with stool                         present. Successful completion of the procedure was                         aided by Procedure aborted. The patient tolerated the                         procedure well. The quality of the bowel preparation                         was poor. Findings:      The perianal and  digital rectal examinations were normal. Pertinent       negatives include normal sphincter tone and no palpable rectal lesions.      Copious quantities of solid stool was found in the rectum, in the       recto-sigmoid colon and in the sigmoid colon, precluding visualization.      Procedure aborted Impression:            - Preparation of the colon was poor.                        - Stool in the rectum, in the recto-sigmoid colon and                         in the sigmoid colon.                        - No specimens collected. Recommendation:        - Discharge patient to home (with escort).                        - Clear liquid diet today.                        - Repeat colonoscopy tomorrow with repeat prep if                         patient is agreeable because the bowel preparation was                         poor, otherwise in 39months with 2 day prep. Procedure Code(s):     --- Professional ---  G0121, 63, Colorectal cancer screening; colonoscopy on                         individual not meeting criteria for high risk Diagnosis Code(s):     --- Professional ---                        Z12.11, Encounter for screening for malignant neoplasm                         of colon CPT copyright 2019 American Medical Association. All rights reserved. The codes documented in this report are preliminary and upon coder review may  be revised to meet current compliance requirements. Dr. Ulyess Mort Lin Landsman MD, MD 09/17/2021 8:02:14 AM This report has been signed electronically. Number of Addenda: 0 Note Initiated On: 09/17/2021 7:20 AM Total Procedure Duration: 0 hours 1 minute 20 seconds  Estimated Blood Loss:  Estimated blood loss: none.      [facility]

## 2021-09-17 NOTE — Anesthesia Preprocedure Evaluation (Signed)
Anesthesia Evaluation  Patient identified by MRN, date of birth, ID band Patient awake    Reviewed: Allergy & Precautions, NPO status , Patient's Chart, lab work & pertinent test results  History of Anesthesia Complications Negative for: history of anesthetic complications  Airway Mallampati: II  TM Distance: >3 FB Neck ROM: Full    Dental no notable dental hx. (+) Dental Advidsory Given   Pulmonary neg shortness of breath, neg sleep apnea, neg COPD, neg recent URI, former smoker,    breath sounds clear to auscultation- rhonchi (-) wheezing      Cardiovascular Exercise Tolerance: Good (-) hypertension(-) angina(-) CAD, (-) Past MI, (-) Cardiac Stents and (-) CABG  Rhythm:Regular Rate:Normal - Systolic murmurs and - Diastolic murmurs    Neuro/Psych  Headaches, PSYCHIATRIC DISORDERS Anxiety Depression    GI/Hepatic negative GI ROS, Neg liver ROS,   Endo/Other  negative endocrine ROSneg diabetes  Renal/GU CRFRenal disease     Musculoskeletal  (+) Arthritis ,   Abdominal (+) - obese,   Peds  Hematology negative hematology ROS (+)   Anesthesia Other Findings Past Medical History: No date: Allergy No date: Chronic pain No date: Chronic sinusitis No date: Depression No date: Heart murmur No date: HPV (human papilloma virus) anogenital infection No date: Migraine No date: Psoriatic arthritis ([facility])   Reproductive/Obstetrics                             Anesthesia Physical  Anesthesia Plan  ASA: 2  Anesthesia Plan: General   Post-op Pain Management:    Induction: Intravenous  PONV Risk Score and Plan: 2 and Propofol infusion and TIVA  Airway Management Planned: Natural Airway and Nasal Cannula  Additional Equipment:   Intra-op Plan:   Post-operative Plan:   Informed Consent: I have reviewed the patients History and Physical, chart, labs and discussed the procedure including the  risks, benefits and alternatives for the proposed anesthesia with the patient or authorized representative who has indicated his/her understanding and acceptance.     Dental advisory given  Plan Discussed with: CRNA and Anesthesiologist  Anesthesia Plan Comments:         Anesthesia Quick Evaluation

## 2021-09-17 NOTE — Transfer of Care (Signed)
Immediate Anesthesia Transfer of Care Note  Patient: Barbara Faulkner  Procedure(s) Performed: COLONOSCOPY WITH PROPOFOL  Patient Location: Endoscopy Unit  Anesthesia Type:General  Level of Consciousness: drowsy and patient cooperative  Airway & Oxygen Therapy: Patient Spontanous Breathing and Patient connected to face mask oxygen  Post-op Assessment: Report given to RN and Post -op Vital signs reviewed and stable  Post vital signs: Reviewed and stable  Last Vitals:  Vitals Value Taken Time  BP 157/95 09/17/21 0803  Temp    Pulse 62 09/17/21 0804  Resp 17 09/17/21 0804  SpO2 100 % 09/17/21 0804  Vitals shown include unvalidated device data.  Last Pain:  Vitals:   09/17/21 0659  TempSrc: Temporal  PainSc: 0-No pain         Complications: No notable events documented.

## 2021-09-17 NOTE — Anesthesia Procedure Notes (Signed)
Procedure Name: General with mask airway Date/Time: 09/17/2021 8:04 AM Performed by: Mohammed Kindle, CRNA Pre-anesthesia Checklist: Patient identified, Emergency Drugs available, Suction available and Patient being monitored Patient Re-evaluated:Patient Re-evaluated prior to induction Oxygen Delivery Method: Simple face mask Induction Type: IV induction Placement Confirmation: CO2 detector and positive ETCO2 Dental Injury: Teeth and Oropharynx as per pre-operative assessment

## 2021-09-18 ENCOUNTER — Ambulatory Visit: Payer: Federal, State, Local not specified - PPO | Admitting: Anesthesiology

## 2021-09-18 ENCOUNTER — Encounter
Admission: RE | Disposition: A | Discharge status: Home / Self Care | Payer: Self-pay | Source: Home / Self Care | Attending: Gastroenterology

## 2021-09-18 ENCOUNTER — Ambulatory Visit
Admission: RE | Admit: 2021-09-18 | Discharge: 2021-09-18 | Disposition: A | Discharge status: Home / Self Care | Payer: Federal, State, Local not specified - PPO | Attending: Gastroenterology | Admitting: Gastroenterology

## 2021-09-18 ENCOUNTER — Encounter: Payer: Self-pay | Admitting: Gastroenterology

## 2021-09-18 DIAGNOSIS — G8929 Other chronic pain: Secondary | ICD-10-CM | POA: Insufficient documentation

## 2021-09-18 DIAGNOSIS — F32A Depression, unspecified: Secondary | ICD-10-CM | POA: Diagnosis not present

## 2021-09-18 DIAGNOSIS — F419 Anxiety disorder, unspecified: Secondary | ICD-10-CM | POA: Insufficient documentation

## 2021-09-18 DIAGNOSIS — Q438 Other specified congenital malformations of intestine: Secondary | ICD-10-CM | POA: Diagnosis not present

## 2021-09-18 DIAGNOSIS — N182 Chronic kidney disease, stage 2 (mild): Secondary | ICD-10-CM | POA: Diagnosis not present

## 2021-09-18 DIAGNOSIS — R519 Headache, unspecified: Secondary | ICD-10-CM | POA: Insufficient documentation

## 2021-09-18 DIAGNOSIS — Z87891 Personal history of nicotine dependence: Secondary | ICD-10-CM | POA: Insufficient documentation

## 2021-09-18 DIAGNOSIS — Z1211 Encounter for screening for malignant neoplasm of colon: Secondary | ICD-10-CM | POA: Insufficient documentation

## 2021-09-18 DIAGNOSIS — M1990 Unspecified osteoarthritis, unspecified site: Secondary | ICD-10-CM | POA: Insufficient documentation

## 2021-09-18 HISTORY — PX: COLONOSCOPY WITH PROPOFOL: SHX5780

## 2021-09-18 LAB — POCT PREGNANCY, URINE: Preg Test, Ur: NEGATIVE

## 2021-09-18 SURGERY — COLONOSCOPY WITH PROPOFOL
Anesthesia: General

## 2021-09-18 MED ORDER — STERILE WATER FOR IRRIGATION IR SOLN
Status: DC | PRN
  Administered 2021-09-18: 60 mL
  Administered 2021-09-18: 120 mL
  Administered 2021-09-18: 60 mL

## 2021-09-18 MED ORDER — PROPOFOL 500 MG/50ML IV EMUL
INTRAVENOUS | Status: AC
  Filled 2021-09-18: qty 50

## 2021-09-18 MED ORDER — MIDAZOLAM HCL 2 MG/2ML IJ SOLN
INTRAMUSCULAR | Status: AC
  Filled 2021-09-18: qty 2

## 2021-09-18 MED ORDER — SODIUM CHLORIDE 0.9 % IV SOLN: INTRAVENOUS | Status: DC

## 2021-09-18 MED ORDER — LIDOCAINE HCL (CARDIAC) PF 100 MG/5ML IV SOSY
PREFILLED_SYRINGE | INTRAVENOUS | Status: DC | PRN
Start: 2021-09-18 — End: 2021-09-18
  Administered 2021-09-18: 100 mg via INTRAVENOUS

## 2021-09-18 MED ORDER — MIDAZOLAM HCL 2 MG/2ML IJ SOLN
INTRAMUSCULAR | Status: DC | PRN
  Administered 2021-09-18: 2 mg via INTRAVENOUS

## 2021-09-18 MED ORDER — PROPOFOL 10 MG/ML IV BOLUS
INTRAVENOUS | Status: DC | PRN
  Administered 2021-09-18: 300 ug/kg/min via INTRAVENOUS

## 2021-09-18 NOTE — Op Note (Signed)
[facility] Gastroenterology Patient Name: Barbara Faulkner Procedure Date: 09/18/2021 9:23 AM MRN: [id number] Account #: [id number] Date of Birth: Mar 30, 1966 Admit Type: Outpatient Age: 56 Room: [facility] ENDO ROOM 4 Gender: Female Note Status: Finalized Instrument Name: Jasper Riling [id number] Procedure:             Colonoscopy Indications:           Screening for colorectal malignant neoplasm Providers:             Lin Landsman MD, MD Referring MD:          Nino Glow Mclean-Scocuzza MD, MD (Referring MD) Medicines:             General Anesthesia Complications:         No immediate complications. Estimated blood loss: None. Procedure:             Pre-Anesthesia Assessment:                        - Prior to the procedure, a History and Physical was                         performed, and patient medications and allergies were                         reviewed. The patient is competent. The risks and                         benefits of the procedure and the sedation options and                         risks were discussed with the patient. All questions                         were answered and informed consent was obtained.                         Patient identification and proposed procedure were                         verified by the physician, the nurse, the                         anesthesiologist, the anesthetist and the technician                         in the pre-procedure area in the procedure room in the                         endoscopy suite. Mental Status Examination: alert and                         oriented. Airway Examination: normal oropharyngeal                         airway and neck mobility. Respiratory Examination:                         clear to auscultation. CV Examination: normal.  Prophylactic Antibiotics: The patient does not require                         prophylactic antibiotics. Prior Anticoagulants: The                          patient has taken no previous anticoagulant or                         antiplatelet agents. ASA Grade Assessment: III - A                         patient with severe systemic disease. After reviewing                         the risks and benefits, the patient was deemed in                         satisfactory condition to undergo the procedure. The                         anesthesia plan was to use general anesthesia.                         Immediately prior to administration of medications,                         the patient was re-assessed for adequacy to receive                         sedatives. The heart rate, respiratory rate, oxygen                         saturations, blood pressure, adequacy of pulmonary                         ventilation, and response to care were monitored                         throughout the procedure. The physical status of the                         patient was re-assessed after the procedure.                        After obtaining informed consent, the colonoscope was                         passed under direct vision. Throughout the procedure,                         the patient's blood pressure, pulse, and oxygen                         saturations were monitored continuously. The                         Colonoscope was introduced through the anus and  advanced to the the cecum, identified by appendiceal                         orifice and ileocecal valve. The colonoscopy was                         unusually difficult due to significant looping and the                         patient's body habitus. Successful completion of the                         procedure was aided by applying abdominal pressure.                         The patient tolerated the procedure well. The quality                         of the bowel preparation was evaluated using the BBPS                         [facility] Bowel Preparation Scale) with  scores of: Right                         Colon = 2 (minor amount of residual staining, small                         fragments of stool and/or opaque liquid, but mucosa                         seen well), Transverse Colon = 2 (minor amount of                         residual staining, small fragments of stool and/or                         opaque liquid, but mucosa seen well) and Left Colon =                         2 (minor amount of residual staining, small fragments                         of stool and/or opaque liquid, but mucosa seen well).                         The total BBPS score equals 6. Findings:      The perianal and digital rectal examinations were normal. Pertinent       negatives include normal sphincter tone and no palpable rectal lesions.      Copious quantities of liquid stool was found in the entire colon,       precluding visualization. Lavage of the area was performed using 50 -       200 mL of sterile water, resulting in clearance with fair visualization.      The retroflexed view of the distal rectum and anal verge was normal and       showed no anal or rectal abnormalities.  The exam was otherwise without abnormality. Impression:            - Stool in the entire examined colon.                        - The distal rectum and anal verge are normal on                         retroflexion view.                        - The examination was otherwise normal.                        - No specimens collected. Recommendation:        - Discharge patient to home (with escort).                        - Resume previous diet today.                        - Continue present medications.                        - Repeat colonoscopy in 3 years for screening purposes                         with 2 day prep with 2 days of golytely due to fair                         prep. Procedure Code(s):     --- Professional ---                        [id number], Colorectal cancer screening;  colonoscopy on                         individual not meeting criteria for high risk Diagnosis Code(s):     --- Professional ---                        Z12.11, Encounter for screening for malignant neoplasm                         of colon CPT copyright 2019 American Medical Association. All rights reserved. The codes documented in this report are preliminary and upon coder review may  be revised to meet current compliance requirements. Dr. Ulyess Mort Lin Landsman MD, MD 09/18/2021 10:01:13 AM This report has been signed electronically. Number of Addenda: 0 Note Initiated On: 09/18/2021 9:23 AM Scope Withdrawal Time: 0 hours 12 minutes 12 seconds  Total Procedure Duration: 0 hours 24 minutes 7 seconds  Estimated Blood Loss:  Estimated blood loss: none.      [facility]

## 2021-09-18 NOTE — Anesthesia Postprocedure Evaluation (Signed)
Anesthesia Post Note  Patient: Barbara Faulkner  Procedure(s) Performed: COLONOSCOPY WITH PROPOFOL  Patient location during evaluation: PACU Anesthesia Type: General Level of consciousness: awake and alert, oriented and patient cooperative Pain management: pain level controlled Vital Signs Assessment: post-procedure vital signs reviewed and stable Respiratory status: spontaneous breathing, nonlabored ventilation and respiratory function stable Cardiovascular status: blood pressure returned to baseline and stable Postop Assessment: adequate PO intake Anesthetic complications: no   No notable events documented.   Last Vitals:  Vitals:   09/18/21 1030 09/18/21 1036  BP:  134/83  Pulse: 64 (!) 59  Resp: (!) 24 19  Temp:    SpO2: 97% 99%    Last Pain:  Vitals:   09/18/21 1003  TempSrc: Temporal  PainSc:                  Darrin Nipper

## 2021-09-18 NOTE — H&P (Signed)
Arlyss Repress, MD [address]  [address]  [place], [place] [address]  Main: [phone]  Fax: [phone] Pager: [phone]  Primary Care Physician:  McLean-Scocuzza, Pasty Spillers, MD Primary Gastroenterologist:  Dr. Arlyss Repress  Pre-Procedure History & Physical: HPI:  Barbara Faulkner is a 56 y.o. female is here for an colonoscopy.   Past Medical History:  Diagnosis Date   Allergy    Anxiety    Chronic kidney disease    Chronic pain    Chronic sinusitis    COVID-19    09/14/20, 06/13/21   Depression    Heart murmur    Hiatal hernia    HPV (human papilloma virus) anogenital infection    Macromastia    Migraine    Psoriatic arthritis (HCC)    Shingles    UTI (urinary tract infection)    ecoli 09/2021    Past Surgical History:  Procedure Laterality Date   BREAST BIOPSY Right 01/28/2018   US guided biopsy - heart shaped   BREAST REDUCTION SURGERY Bilateral 04/03/2020   Procedure: MAMMARY REDUCTION  (BREAST);  Surgeon: Contogiannis, Chales Abrahams, MD;  Location: [facility];  Service: Plastics;  Laterality: Bilateral;  HAVE LIPOSUCTION MACHINE AVAILABLE   CHOLECYSTECTOMY  2009   COLONOSCOPY WITH PROPOFOL N/A 07/19/2018   Procedure: COLONOSCOPY WITH PROPOFOL;  Surgeon: Toney Reil, MD;  Location: [facility] ENDOSCOPY;  Service: Gastroenterology;  Laterality: N/A;   COLONOSCOPY WITH PROPOFOL N/A 09/17/2021   Procedure: COLONOSCOPY WITH PROPOFOL;  Surgeon: Toney Reil, MD;  Location: [facility] ENDOSCOPY;  Service: Gastroenterology;  Laterality: N/A;   ESOPHAGOGASTRODUODENOSCOPY (EGD) WITH PROPOFOL N/A 07/19/2018   Procedure: ESOPHAGOGASTRODUODENOSCOPY (EGD) WITH PROPOFOL;  Surgeon: Toney Reil, MD;  Location: [facility] ENDOSCOPY;  Service: Gastroenterology;  Laterality: N/A;   GASTRIC BYPASS     2015; duodenal switch    LAPAROSCOPIC GASTRIC BANDING  2008    removed 2009   TUBAL LIGATION  1997    Prior to Admission medications   Medication Sig Start  Date End Date Taking? Authorizing Provider  calcitRIOL (ROCALTROL) 0.25 MCG capsule Take 0.25 mcg by mouth daily. 04/18/21 08/01/22  [provider]  Cholecalciferol 1.25 MG (50000 UT) capsule Take 1 capsule (50,000 Units total) by mouth once a week. D3 09/06/21   McLean-Scocuzza, Pasty Spillers, MD  lamoTRIgine (LAMICTAL) 150 MG tablet Take 150 mg by mouth daily. 08/28/21   [provider]  oxyCODONE-acetaminophen (PERCOCET) 5-325 MG tablet Take 1 tablet by mouth every 8 (eight) hours as needed for severe pain. 08/15/21 11/13/21  Edward Jolly, MD  QUEtiapine (SEROQUEL) 100 MG tablet Take 100 mg by mouth at bedtime. 08/03/19   [provider]  traMADol (ULTRAM-ER) 200 MG 24 hr tablet Take 1 tablet (200 mg total) by mouth daily. 08/15/21 11/13/21  Edward Jolly, MD  venlafaxine XR (EFFEXOR-XR) 75 MG 24 hr capsule Take 75 mg by mouth daily. 08/11/21   [provider]  VITAMIN D PO Take by mouth daily at 12 noon.    [provider]    Allergies as of 09/17/2021 - Review Complete 09/17/2021  Allergen Reaction Noted   Shellfish allergy Swelling 09/27/2010   Nsaids  09/06/2021    Family History  Problem Relation Age of Onset   HIV Father    Hypertension Mother    Hyperlipidemia Mother    Lung cancer Maternal Grandfather        smoker   Hepatitis Maternal Uncle  drug use   Colon cancer Neg Hx    Colon polyps Neg Hx    Rectal cancer Neg Hx    Stomach cancer Neg Hx    Breast cancer Neg Hx     Social History   Socioeconomic History   Marital status: Married    Spouse name: Pernell Windhorst   Number of children: 2   Years of education: Not on file   Highest education level: Not on file  Occupational History   Occupation: Forensic scientist, Publishing rights manager: [employer]  Tobacco Use   Smoking status: Former    Packs/day: 2.00    Years: 20.00    Pack years: 40.00    Types: Cigarettes    Quit date: 09/02/2003    Years since quitting: 18.0    Smokeless tobacco: Never  Vaping Use   Vaping Use: Never used  Substance and Sexual Activity   Alcohol use: Not Currently   Drug use: Never   Sexual activity: Yes    Partners: Male    Birth control/protection: Post-menopausal  Other Topics Concern   Not on file  Social History Narrative   Married.   1 child son died 12/21/18 due to Suicide, 6 grandchildren   2nd son died 12/21/2003 MVA   Works for the post office.   Enjoys reading and exercising.    Has 2 dogs and 2 cats   Social Determinants of Corporate investment banker Strain: Not on file  Food Insecurity: Not on file  Transportation Needs: Not on file  Physical Activity: Not on file  Stress: Not on file  Social Connections: Not on file  Intimate Partner Violence: Not on file    Review of Systems: See HPI, otherwise negative ROS  Physical Exam: BP (!) 145/91    Pulse 79    Temp (!) 97.5 F (36.4 C) (Temporal)    Resp 16    Ht 5\' 3"  (1.6 m)    Wt 72.6 kg    LMP 05/04/2014    SpO2 99%    BMI 28.34 kg/m  General:   Alert,  pleasant and cooperative in NAD Head:  Normocephalic and atraumatic. Neck:  Supple; no masses or thyromegaly. Lungs:  Clear throughout to auscultation.    Heart:  Regular rate and rhythm. Abdomen:  Soft, nontender and nondistended. Normal bowel sounds, without guarding, and without rebound.   Neurologic:  Alert and  oriented x4;  grossly normal neurologically.  Impression/Plan: Barbara Faulkner is here for an colonoscopy to be performed for colon cancer screening  Risks, benefits, limitations, and alternatives regarding  colonoscopy have been reviewed with the patient.  Questions have been answered.  All parties agreeable.   [name], MD  09/18/2021, 9:15 AM

## 2021-09-18 NOTE — Transfer of Care (Signed)
Immediate Anesthesia Transfer of Care Note  Patient: Barbara Faulkner  Procedure(s) Performed: COLONOSCOPY WITH PROPOFOL  Patient Location: PACU and Endoscopy Unit  Anesthesia Type:MAC  Level of Consciousness: sedated  Airway & Oxygen Therapy: Patient Spontanous Breathing  Post-op Assessment: Report given to RN  Post vital signs: Reviewed and stable  Last Vitals:  Vitals Value Taken Time  BP 141/84 09/18/21 1004  Temp    Pulse 66 09/18/21 1004  Resp 21 09/18/21 1004  SpO2 98 % 09/18/21 1004    Last Pain:  Vitals:   09/18/21 0841  TempSrc: Temporal  PainSc: 0-No pain         Complications: No notable events documented.

## 2021-09-18 NOTE — Anesthesia Preprocedure Evaluation (Addendum)
Anesthesia Evaluation  Patient identified by MRN, date of birth, ID band Patient awake    Reviewed: Allergy & Precautions, NPO status , Patient's Chart, lab work & pertinent test results  History of Anesthesia Complications Negative for: history of anesthetic complications  Airway Mallampati: I   Neck ROM: Full    Dental no notable dental hx.    Pulmonary former smoker (quit 2005),    Pulmonary exam normal breath sounds clear to auscultation       Cardiovascular Exercise Tolerance: Good Normal cardiovascular exam Rhythm:Regular Rate:Normal  ECG 11/02/20: normal   Neuro/Psych  Headaches, PSYCHIATRIC DISORDERS Anxiety Depression Chronic hip and shoulder pain    GI/Hepatic hiatal hernia, S/p duodenal switch   Endo/Other  negative endocrine ROS  Renal/GU Renal disease (stage II CKD)     Musculoskeletal  (+) Arthritis ,   Abdominal   Peds  Hematology negative hematology ROS (+)   Anesthesia Other Findings   Reproductive/Obstetrics                            Anesthesia Physical Anesthesia Plan  ASA: 2  Anesthesia Plan: General   Post-op Pain Management:    Induction: Intravenous  PONV Risk Score and Plan: 3 and Propofol infusion, TIVA and Treatment may vary due to age or medical condition  Airway Management Planned: Natural Airway  Additional Equipment:   Intra-op Plan:   Post-operative Plan:   Informed Consent: I have reviewed the patients History and Physical, chart, labs and discussed the procedure including the risks, benefits and alternatives for the proposed anesthesia with the patient or authorized representative who has indicated his/her understanding and acceptance.       Plan Discussed with: CRNA  Anesthesia Plan Comments: (LMA/GETA backup discussed.  Patient consented for risks of anesthesia including but not limited to:  - adverse reactions to medications -  damage to eyes, teeth, lips or other oral mucosa - nerve damage due to positioning  - sore throat or hoarseness - damage to heart, brain, nerves, lungs, other parts of body or loss of life  Informed patient about role of CRNA in peri- and intra-operative care.  Patient voiced understanding.)        Anesthesia Quick Evaluation

## 2021-09-19 ENCOUNTER — Encounter: Payer: Self-pay | Admitting: Gastroenterology

## 2021-09-20 ENCOUNTER — Ambulatory Visit (INDEPENDENT_AMBULATORY_CARE_PROVIDER_SITE_OTHER): Payer: Federal, State, Local not specified - PPO

## 2021-09-20 ENCOUNTER — Ambulatory Visit: Payer: Federal, State, Local not specified - PPO | Admitting: Family Medicine

## 2021-09-20 ENCOUNTER — Other Ambulatory Visit: Payer: Self-pay

## 2021-09-20 ENCOUNTER — Encounter: Payer: Self-pay | Admitting: Internal Medicine

## 2021-09-20 DIAGNOSIS — Z23 Encounter for immunization: Secondary | ICD-10-CM | POA: Diagnosis not present

## 2021-09-20 NOTE — Progress Notes (Signed)
Patient presented for tdap injection to left deltoid, patient voiced no concerns nor showed any signs of distress during injection.  Patient presented for flu shot to left deltoid, patient voiced no concerns nor showed any signs of distress during injection

## 2021-09-23 NOTE — Anesthesia Postprocedure Evaluation (Signed)
Anesthesia Post Note  Patient: Barbara Faulkner  Procedure(s) Performed: COLONOSCOPY WITH PROPOFOL  Patient location during evaluation: Endoscopy Anesthesia Type: General Level of consciousness: awake and alert Pain management: pain level controlled Vital Signs Assessment: post-procedure vital signs reviewed and stable Respiratory status: spontaneous breathing, nonlabored ventilation, respiratory function stable and patient connected to nasal cannula oxygen Cardiovascular status: blood pressure returned to baseline and stable Postop Assessment: no apparent nausea or vomiting Anesthetic complications: no   No notable events documented.   Last Vitals:  Vitals:   09/17/21 0820 09/17/21 0830  BP: (!) 153/99 (!) 161/97  Pulse: 66 69  Resp: 18 20  Temp:    SpO2: 99% 99%    Last Pain:  Vitals:   09/17/21 0800  TempSrc: Temporal  PainSc:                  Lenard Simmer

## 2021-09-30 ENCOUNTER — Other Ambulatory Visit: Payer: Self-pay | Admitting: Student in an Organized Health Care Education/Training Program

## 2021-09-30 DIAGNOSIS — G894 Chronic pain syndrome: Secondary | ICD-10-CM

## 2021-09-30 DIAGNOSIS — G8929 Other chronic pain: Secondary | ICD-10-CM

## 2021-09-30 DIAGNOSIS — M25511 Pain in right shoulder: Secondary | ICD-10-CM

## 2021-09-30 DIAGNOSIS — L4050 Arthropathic psoriasis, unspecified: Secondary | ICD-10-CM

## 2021-09-30 DIAGNOSIS — M2550 Pain in unspecified joint: Secondary | ICD-10-CM

## 2021-10-03 ENCOUNTER — Other Ambulatory Visit: Payer: Self-pay | Admitting: Student in an Organized Health Care Education/Training Program

## 2021-10-03 DIAGNOSIS — L4050 Arthropathic psoriasis, unspecified: Secondary | ICD-10-CM

## 2021-10-03 DIAGNOSIS — M2550 Pain in unspecified joint: Secondary | ICD-10-CM

## 2021-10-03 DIAGNOSIS — G894 Chronic pain syndrome: Secondary | ICD-10-CM

## 2021-10-03 DIAGNOSIS — G8929 Other chronic pain: Secondary | ICD-10-CM

## 2021-10-03 DIAGNOSIS — A6920 Lyme disease, unspecified: Secondary | ICD-10-CM

## 2021-10-03 DIAGNOSIS — M25511 Pain in right shoulder: Secondary | ICD-10-CM

## 2021-10-08 ENCOUNTER — Telehealth: Payer: Self-pay | Admitting: Student in an Organized Health Care Education/Training Program

## 2021-10-08 NOTE — Telephone Encounter (Signed)
Patient understands that the script must last her 41 days(until March 2023) and is only for BT. Limit one per day. She does not know how/why they are missing. Will keep her March appointment and make sure that her meds are secured.

## 2021-10-08 NOTE — Telephone Encounter (Signed)
Patient states her pharmacy does not have another script to fill for Oxycodone. Her MM appt isnt until March 9. Please advise patient

## 2021-11-07 ENCOUNTER — Other Ambulatory Visit: Payer: Self-pay

## 2021-11-07 ENCOUNTER — Encounter: Payer: Self-pay | Admitting: Student in an Organized Health Care Education/Training Program

## 2021-11-07 ENCOUNTER — Ambulatory Visit
Payer: Federal, State, Local not specified - PPO | Attending: Student in an Organized Health Care Education/Training Program | Admitting: Student in an Organized Health Care Education/Training Program

## 2021-11-07 VITALS — BP 130/83 | HR 76 | Temp 98.2°F | Resp 18 | Ht 63.0 in | Wt 160.0 lb

## 2021-11-07 DIAGNOSIS — F419 Anxiety disorder, unspecified: Secondary | ICD-10-CM

## 2021-11-07 DIAGNOSIS — G894 Chronic pain syndrome: Secondary | ICD-10-CM | POA: Diagnosis not present

## 2021-11-07 DIAGNOSIS — M25511 Pain in right shoulder: Secondary | ICD-10-CM | POA: Diagnosis not present

## 2021-11-07 DIAGNOSIS — M1612 Unilateral primary osteoarthritis, left hip: Secondary | ICD-10-CM

## 2021-11-07 DIAGNOSIS — G8929 Other chronic pain: Secondary | ICD-10-CM

## 2021-11-07 DIAGNOSIS — L4050 Arthropathic psoriasis, unspecified: Secondary | ICD-10-CM | POA: Diagnosis not present

## 2021-11-07 DIAGNOSIS — A6920 Lyme disease, unspecified: Secondary | ICD-10-CM | POA: Diagnosis not present

## 2021-11-07 DIAGNOSIS — M2550 Pain in unspecified joint: Secondary | ICD-10-CM

## 2021-11-07 DIAGNOSIS — M7918 Myalgia, other site: Secondary | ICD-10-CM

## 2021-11-07 DIAGNOSIS — M25561 Pain in right knee: Secondary | ICD-10-CM | POA: Diagnosis not present

## 2021-11-07 DIAGNOSIS — M25551 Pain in right hip: Secondary | ICD-10-CM | POA: Diagnosis not present

## 2021-11-07 DIAGNOSIS — M25562 Pain in left knee: Secondary | ICD-10-CM

## 2021-11-07 DIAGNOSIS — M25552 Pain in left hip: Secondary | ICD-10-CM | POA: Diagnosis not present

## 2021-11-07 DIAGNOSIS — M797 Fibromyalgia: Secondary | ICD-10-CM | POA: Diagnosis not present

## 2021-11-07 DIAGNOSIS — M19011 Primary osteoarthritis, right shoulder: Secondary | ICD-10-CM | POA: Diagnosis not present

## 2021-11-07 DIAGNOSIS — M25512 Pain in left shoulder: Secondary | ICD-10-CM

## 2021-11-07 DIAGNOSIS — F32A Depression, unspecified: Secondary | ICD-10-CM | POA: Insufficient documentation

## 2021-11-07 MED ORDER — TRAMADOL HCL ER 200 MG PO TB24: 200.0000 mg | ORAL_TABLET | Freq: Every day | ORAL | 2 refills | Status: DC

## 2021-11-07 MED ORDER — OXYCODONE-ACETAMINOPHEN 5-325 MG PO TABS: 1.0000 | ORAL_TABLET | Freq: Three times a day (TID) | ORAL | 0 refills | Status: DC | PRN

## 2021-11-07 NOTE — Progress Notes (Signed)
Nursing Pain Medication Assessment:  ?Safety precautions to be maintained throughout the outpatient stay will include: orient to surroundings, keep bed in low position, maintain call bell within reach at all times, provide assistance with transfer out of bed and ambulation.  ?Medication Inspection Compliance: Pill count conducted under aseptic conditions, in front of the patient. Neither the pills nor the bottle was removed from the patient's sight at any time. Once count was completed pills were immediately returned to the patient in their original bottle. ? ?Medication: Tramadol (Ultram) ?Pill/Patch Count:  5 of 30 pills remain ?Pill/Patch Appearance: Markings consistent with prescribed medication ?Bottle Appearance: Standard pharmacy container. Clearly labeled. ?Filled Date: 02 / 20 / 2023 ?Last Medication intake:  Today ? ? ?Oxycodone/percocet ?0/38filled  ?08/15/2021 ?1 month ago ? ?

## 2021-11-07 NOTE — Progress Notes (Signed)
PROVIDER NOTE: Information contained herein reflects review and annotations entered in association with encounter. Interpretation of such information and data should be left to medically-trained personnel. Information provided to patient can be located elsewhere in the medical record under "Patient Instructions". Document created using STT-dictation technology, any transcriptional errors that may result from process are unintentional.    Patient: Barbara Faulkner  Service Category: E/M  Provider: Gillis Santa, MD  DOB: 07/11/1966  DOS: 02/26/2021  Specialty: Interventional Pain Management  MRN: [id number]  Setting: Ambulatory outpatient  PCP: McLean-Scocuzza, Nino Glow, MD  Type: Established Patient    Referring Provider: Orland Mustard *  Location: Office  Delivery: Face-to-face     HPI  Barbara Faulkner, a 56 y.o. year old female, is here today because of her Psoriatic arthritis ([place]) [L40.50]. Ms. Holben primary complain today is Hip Pain (left)  Last encounter: My last encounter with her was on 08/15/21 Pertinent problems: Ms. 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 Pain Assessment: Severity of Chronic pain is reported as a 10-Worst pain ever/10. Location: Hip Left/radiates down left leg to mid thigh in back. Onset: More than a month ago. Quality: Sharp. Timing: Constant. Modifying factor(s): meds. Vitals:  height is _0  (1.6 m) and weight is 160 lb (72.6 kg). Her temperature is 98.2 F (36.8 C). Her blood pressure is 130/83 and her pulse is 76. Her respiration is 18 and oxygen saturation is 97%.   Reason for encounter: medication management.     Patient presents today for medication management.  She states that someone stole her oxycodone from work.  We spent a great deal of time today reviewing proper storage and  the importance of medication compliance which includes release of being mindful of where her medications are at each time.  She states that she has not taken her pain medication to work any longer and is just taking a couple of tablets that she may need during her shift.  She has an upcoming visit with Dr. Zigmund Daniel for an ultrasound-guided hip injection.  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  Instructed to avoid NSAIDs and nephrotoxic medications.  Otherwise refill of tramadol and oxycodone as below.  Reviewed proper storage instructions.  No change in dose.  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   Pharmacotherapy Assessment   Analgesic: Tramadol 200 mg ER, continue oxycodone 5 mg daily for breakthrough pain.     Monitoring: [place] PMP: PDMP reviewed during this encounter.       Pharmacotherapy: No side-effects or adverse reactions reported. Compliance: No problems identified. Effectiveness: Clinically acceptable.  UDS:  Summary  Date Value Ref Range Status  05/21/2021 Note  Final    Comment:    ==================================================================== [employer] Select 13 (MW) ==================================================================== Test                             Result       Flag       Units  Drug Present and Declared for Prescription Verification   Tramadol                       1367         EXPECTED   ng/mg creat   O-Desmethyltramadol  1186         EXPECTED   ng/mg creat   N-Desmethyltramadol            233          EXPECTED   ng/mg creat    Source of tramadol is a prescription medication. O-desmethyltramadol    and N-desmethyltramadol are expected metabolites of tramadol.  Drug Absent but Declared for Prescription Verification   Oxycodone                      Not Detected UNEXPECTED ng/mg creat ==================================================================== Test                      Result    Flag   Units      Ref  Range   Creatinine              194              mg/dL      >=20 ==================================================================== Declared Medications:  The flagging and interpretation on this report are based on the  following declared medications.  Unexpected results may arise from  inaccuracies in the declared medications.   **Note: The testing scope of this panel includes these medications:   Oxycodone (Percocet)  Tramadol (Ultram)   **Note: The testing scope of this panel does not include the  following reported medications:   Acetaminophen (Percocet)  Albuterol (Ventolin HFA)  Cholecalciferol  Diclofenac (Voltaren)  Fluticasone (Flonase)  Gabapentin (Neurontin)  Lamotrigine (Lamictal)  Methocarbamol (Robaxin)  Pantoprazole (Protonix)  Quetiapine (Seroquel)  Rizatriptan (Maxalt) ==================================================================== For clinical consultation, please call [phone]. ====================================================================       ROS  Constitutional: Denies any fever or chills Gastrointestinal: No reported hemesis, hematochezia, vomiting, or acute GI distress Musculoskeletal:   bilateral hip pain left>right Neurological: No reported episodes of acute onset apraxia, aphasia, dysarthria, agnosia, amnesia, paralysis, loss of coordination, or loss of consciousness  Medication Review  Cholecalciferol, QUEtiapine, Vitamin D, calcitRIOL, lamoTRIgine, oxyCODONE-acetaminophen, traMADol, and venlafaxine XR  History Review  Allergy: Barbara Faulkner is allergic to shellfish allergy and nsaids. Drug: Barbara Faulkner  reports no history of drug use. Alcohol:  reports that she does not currently use alcohol. Tobacco:  reports that she quit smoking about 18 years ago. Her smoking use included cigarettes. She has a 54.00 pack-year smoking history. She has never used smokeless tobacco. Social: Barbara Faulkner  reports that she quit smoking about  18 years ago. Her smoking use included cigarettes. She has a 54.00 pack-year smoking history. She has never used smokeless tobacco. She reports that she does not currently use alcohol. She reports that she does not use drugs. Medical:  has a past medical history of Allergy, Anxiety, Chronic kidney disease, Chronic pain, Chronic sinusitis, COVID-19, Depression, Heart murmur, Hiatal hernia, HPV (human papilloma virus) anogenital infection, Macromastia, Migraine, Psoriatic arthritis ([place]), Shingles, and UTI (urinary tract infection). Surgical: Ms. Loyola  has a past surgical history that includes Cholecystectomy (2009); Tubal ligation (1997); Laparoscopic gastric banding (2008 ); Gastric bypass; Breast biopsy (Right, 01/28/2018); Colonoscopy with propofol (N/A, 07/19/2018); Esophagogastroduodenoscopy (egd) with propofol (N/A, 07/19/2018); Breast reduction surgery (Bilateral, 04/03/2020); Colonoscopy with propofol (N/A, 09/17/2021); and Colonoscopy with propofol (N/A, 09/18/2021). Family: family history includes HIV in her father; Hepatitis in her maternal uncle; Hyperlipidemia in her mother; Hypertension in her mother; Lung cancer in her maternal grandfather.  Laboratory Chemistry Profile   Renal Lab Results  Component Value Date  BUN 19 09/06/2021   CREATININE 1.07 15/40/0867   BCR NOT APPLICABLE 61/95/0932   GFR 59.41 (L) 09/06/2021   GFRAA >60 02/15/2014   GFRNONAA >60 02/15/2014     Hepatic Lab Results  Component Value Date   AST 56 (H) 09/06/2021   ALT 53 (H) 09/06/2021   ALBUMIN 4.1 09/06/2021   ALKPHOS 206 (H) 09/06/2021   AMYLASE 33 09/25/2014   LIPASE 124 10/04/2013     Electrolytes Lab Results  Component Value Date   NA 142 09/06/2021   K 3.9 09/06/2021   CL 113 (H) 09/06/2021   CALCIUM 7.8 (L) 09/06/2021   MG 1.9 07/14/2018   PHOS 5.0 (H) 09/25/2014     Bone Lab Results  Component Value Date   VD25OH 9.94 (L) 09/06/2021     Inflammation (CRP: Acute Phase) (ESR:  Chronic Phase) Lab Results  Component Value Date   CRP <1.0 03/10/2019   ESRSEDRATE 3 03/10/2019       Note: Above Lab results reviewed.  Recent Imaging Review  [place] LIMITED JOINT SPACE STRUCTURES LOW LEFT Procedure:  Injection of left hip joint under ultrasound guidance. Ultrasound guidance utilized for in plane approach to the left hip, the  femoral head neck junction was visualized and targeted, no significant  joint effusion was seen, hypoechoic response from injectate visualized  Samsung [id number] device utilized with permanent recording / reporting. Verbal informed consent obtained and verified. Skin prepped in a sterile fashion. Ethyl chloride for topical local analgesia.  Completed without difficulty and tolerated well. Medication: triamcinolone acetonide 40 mg/mL suspension for injection 1 mL  total and 2 mL lidocaine 1% without epinephrine utilized for needle  placement anesthetic Advised to contact for fevers/chills, erythema, induration, drainage, or  persistent bleeding.  Note: Reviewed        Physical Exam  General appearance: Well nourished, well developed, and well hydrated. In no apparent acute distress Mental status: Alert, oriented x 3 (person, place, & time)       Respiratory: No evidence of acute respiratory distress Eyes: PERLA Vitals: BP 130/83    Pulse 76    Temp 98.2 F (36.8 C)    Resp 18    Ht _0  (1.6 m)    Wt 160 lb (72.6 kg)    LMP 05/04/2014    SpO2 97%    BMI 28.34 kg/m  BMI: Estimated body mass index is 28.34 kg/m as calculated from the following:   Height as of this encounter: _1  (1.6 m).   Weight as of this encounter: 160 lb (72.6 kg). Ideal: Ideal body weight: 52.4 kg (115 lb 8.3 oz) Adjusted ideal body weight: 60.5 kg (133 lb 5 oz)    Left hip pain, with radiation into left groin and overlying trochanteric bursa   Assessment   Status Diagnosis  Persistent Having a Flare-up Controlled 1. Psoriatic arthritis ([place])   2. Primary  osteoarthritis of left hip   3. 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 Glenohumeral arthritis, right   5. Fibromyalgia   6. Chronic pain syndrome   7. Chronic arthralgias of knees and hips   8. Anxiety and depression   9. Myofascial pain syndrome   10. Chronic right shoulder pain   11. Chronic pain of both shoulders         Plan of Care  Ms. EVANGELYNE LOJA has a current medication list which includes the following long-term medication(s): quetiapine and venlafaxine xr.  Pharmacotherapy (Medications Ordered): Meds  ordered this encounter  Medications   traMADol (ULTRAM-ER) 200 MG 24 hr tablet    Sig: Take 1 tablet (200 mg total) by mouth daily.    Dispense:  30 tablet    Refill:  2   oxyCODONE-acetaminophen (PERCOCET) 5-325 MG tablet    Sig: Take 1 tablet by mouth every 8 (eight) hours as needed for severe pain.    Dispense:  90 tablet    Refill:  0    Chronic Pain. (STOP Act - Not applicable). Fill one day early if closed on scheduled refill date.    Percocet prescription as above for quantity 90 to last 3 months  Follow-up plan:   Return in about 3 months (around 02/07/2022) for Medication Management, in person.   Recent Visits Date Type Provider Dept  08/15/21 Office Visit Gillis Santa, MD [facility]  Showing recent visits within past 90 days and meeting all other requirements Today's Visits Date Type Provider Dept  11/07/21 Office Visit Gillis Santa, MD [facility]  Showing today's visits and meeting all other requirements Future Appointments Date Type Provider Dept  02/04/22 Appointment Gillis Santa, MD [facility]  Showing future appointments within next 90 days and meeting all other requirements  I discussed the assessment and treatment plan with the patient. The patient was provided an opportunity to ask questions and all were answered. The patient agreed with the plan and demonstrated an understanding of the instructions.  Patient  advised to call back or seek an in-person evaluation if the symptoms or condition worsens.  Duration of encounter:27mnutes.  Note by: BGillis Santa MD Date: 11/07/2021; Time: 2:38 PM

## 2021-11-08 ENCOUNTER — Ambulatory Visit (INDEPENDENT_AMBULATORY_CARE_PROVIDER_SITE_OTHER): Payer: Federal, State, Local not specified - PPO

## 2021-11-08 DIAGNOSIS — Z23 Encounter for immunization: Secondary | ICD-10-CM | POA: Diagnosis not present

## 2021-11-08 NOTE — Progress Notes (Signed)
Pt presented today for her 2nd Shingrix vaccine. Left deltoid, IM. Pt voiced no concerns nor showed any signs of distress during injection.  ? ?

## 2021-11-11 ENCOUNTER — Ambulatory Visit: Payer: Federal, State, Local not specified - PPO | Admitting: Family Medicine

## 2021-11-13 ENCOUNTER — Ambulatory Visit: Payer: Federal, State, Local not specified - PPO | Admitting: Family Medicine

## 2021-11-13 ENCOUNTER — Encounter: Payer: Self-pay | Admitting: Family Medicine

## 2021-11-13 ENCOUNTER — Other Ambulatory Visit: Payer: Self-pay

## 2021-11-13 VITALS — BP 126/78 | HR 80 | Ht 63.0 in | Wt 171.0 lb

## 2021-11-13 DIAGNOSIS — F39 Unspecified mood [affective] disorder: Secondary | ICD-10-CM | POA: Diagnosis not present

## 2021-11-13 DIAGNOSIS — G8929 Other chronic pain: Secondary | ICD-10-CM | POA: Diagnosis not present

## 2021-11-13 DIAGNOSIS — F419 Anxiety disorder, unspecified: Secondary | ICD-10-CM | POA: Diagnosis not present

## 2021-11-13 DIAGNOSIS — M533 Sacrococcygeal disorders, not elsewhere classified: Secondary | ICD-10-CM

## 2021-11-13 DIAGNOSIS — M25552 Pain in left hip: Secondary | ICD-10-CM | POA: Diagnosis not present

## 2021-11-13 DIAGNOSIS — F4312 Post-traumatic stress disorder, chronic: Secondary | ICD-10-CM | POA: Diagnosis not present

## 2021-11-13 DIAGNOSIS — F5105 Insomnia due to other mental disorder: Secondary | ICD-10-CM | POA: Diagnosis not present

## 2021-11-13 DIAGNOSIS — G5702 Lesion of sciatic nerve, left lower limb: Secondary | ICD-10-CM | POA: Insufficient documentation

## 2021-11-13 MED ORDER — PREDNISONE 50 MG PO TABS: 50.0000 mg | ORAL_TABLET | Freq: Every day | ORAL | 0 refills | Status: DC

## 2021-11-13 NOTE — Patient Instructions (Signed)
-   Start prednisone, take for full 5-day course ?- Perform weightbearing activity as tolerated (walking, time on your feet, etc.) using hip symptoms as a guide, do not push through pain ?- Continue additional pain control methods as you currently are ?- Return for close follow-up once scheduled ?- Reach out for any questions between now and then ?

## 2021-11-15 NOTE — Assessment & Plan Note (Signed)
Patient with again noted left lateral hip pain in the setting of underlying hip osteoarthritis, this is chronic in nature with ongoing symptomatology.  At her last visit we did perform ultrasound-guided hip joint injection with the hopes that these secondary areas should resolve, given the persistence I have advised course of prednisone, close follow-up and low threshold for repeat cortisone injections if symptoms persist without adequate improvement. ? ?Chronic condition, symptomatic, Rx management ?

## 2021-11-15 NOTE — Assessment & Plan Note (Signed)
Recalcitrant focal left hip pain at the SI joint, this is in the setting of left hip osteoarthritis and associated deconditioning at the structure surrounding the hip.  At her last visit we did proceed with ultrasound-guided left hip joint injection in the hopes that surrounding structures would resolve, given the persistence I have advised scheduled oral prednisone, close follow-up and low threshold for ultrasound-guided left SI joint injection if symptoms persist. ?

## 2021-11-15 NOTE — Assessment & Plan Note (Signed)
Chronic condition that remains symptomatic in the setting of left hip osteoarthritis, can be considered secondary/compensatory in nature associated deconditioning.  Plan for scheduled prednisone, low threshold return for ultrasound-guided injection ?

## 2021-11-15 NOTE — Progress Notes (Signed)
?  ? ?  Primary Care / Sports Medicine Office Visit ? ?Patient Information:  ?Patient ID: Barbara Faulkner, female DOB: 13-Jul-1966 Age: 56 y.o. MRN: [id number]  ? ?Barbara Faulkner is a pleasant 56 y.o. female presenting with the following: ? ?Chief Complaint  ?Patient presents with  ? Hip Pain  ?  Left hip pain, got worst end of last week, would like another today.   ? ? ?Vitals:  ? 11/13/21 1041  ?BP: 126/78  ?Pulse: 80  ?SpO2: 97%  ? ?Vitals:  ? 11/13/21 1041  ?Weight: 171 lb (77.6 kg)  ?Height: 5\' 3"  (1.6 m)  ? ?Body mass index is 30.29 kg/m?. ? ?No results found.  ? ?Independent interpretation of notes and tests performed by another provider:  ? ?None ? ?Procedures performed:  ? ?None ? ?Pertinent History, Exam, Impression, and Recommendations:  ? ?Greater 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 ?Patient with again noted left lateral hip pain in the setting of underlying hip osteoarthritis, this is chronic in nature with ongoing symptomatology.  At her last visit we did perform ultrasound-guided hip joint injection with the hopes that these secondary areas should resolve, given the persistence I have advised course of prednisone, close follow-up and low threshold for repeat cortisone injections if symptoms persist without adequate improvement. ? ?Chronic condition, symptomatic, Rx management ? ?Chronic left SI joint pain ?Recalcitrant focal left hip pain at the SI joint, this is in the setting of left hip osteoarthritis and associated deconditioning at the structure surrounding the hip.  At her last visit we did proceed with ultrasound-guided left hip joint injection in the hopes that surrounding structures would resolve, given the persistence I have advised scheduled oral prednisone, close follow-up and low threshold for ultrasound-guided left SI joint injection if symptoms persist. ? ?Piriformis syndrome, left ?Chronic condition that remains symptomatic in the setting of left hip osteoarthritis, can be  considered secondary/compensatory in nature associated deconditioning.  Plan for scheduled prednisone, low threshold return for ultrasound-guided injection  ? ?Orders & Medications ?Meds ordered this encounter  ?Medications  ? predniSONE (DELTASONE) 50 MG tablet  ?  Sig: Take 1 tablet (50 mg total) by mouth daily.  ?  Dispense:  5 tablet  ?  Refill:  0  ? ?No orders of the defined types were placed in this encounter. ?  ? ?Return in about 5 days (around 11/18/2021).  ?  ? ?11/20/2021, MD ? ? Primary Care Sports Medicine ?[facility] ?[facility] [place]  ? ?

## 2021-11-19 ENCOUNTER — Ambulatory Visit: Payer: Federal, State, Local not specified - PPO | Admitting: Family Medicine

## 2021-11-21 ENCOUNTER — Other Ambulatory Visit: Payer: Self-pay

## 2021-11-21 ENCOUNTER — Inpatient Hospital Stay: Payer: Self-pay | Admitting: Radiology

## 2021-11-21 ENCOUNTER — Ambulatory Visit: Payer: Federal, State, Local not specified - PPO | Admitting: Family Medicine

## 2021-11-21 ENCOUNTER — Encounter: Payer: Self-pay | Admitting: Family Medicine

## 2021-11-21 ENCOUNTER — Ambulatory Visit (INDEPENDENT_AMBULATORY_CARE_PROVIDER_SITE_OTHER): Payer: Federal, State, Local not specified - PPO | Admitting: Family Medicine

## 2021-11-21 VITALS — BP 122/88 | HR 82 | Ht 63.0 in | Wt 171.6 lb

## 2021-11-21 DIAGNOSIS — M533 Sacrococcygeal disorders, not elsewhere classified: Secondary | ICD-10-CM

## 2021-11-21 DIAGNOSIS — M25552 Pain in left hip: Secondary | ICD-10-CM | POA: Diagnosis not present

## 2021-11-21 DIAGNOSIS — G8929 Other chronic pain: Secondary | ICD-10-CM

## 2021-11-21 DIAGNOSIS — G5702 Lesion of sciatic nerve, left lower limb: Secondary | ICD-10-CM

## 2021-11-21 MED ORDER — PREDNISONE 10 MG PO TABS: 10.0000 mg | ORAL_TABLET | Freq: Every day | ORAL | 0 refills | Status: DC | PRN

## 2021-11-21 MED ORDER — TRIAMCINOLONE ACETONIDE 40 MG/ML IJ SUSP
120.0000 mg | Freq: Once | INTRAMUSCULAR | Status: AC
  Administered 2021-11-21: 120 mg via INTRAMUSCULAR

## 2021-11-21 NOTE — Assessment & Plan Note (Signed)
Patient returns for close follow-up regarding left hip pain in the setting of underlying hip osteoarthritis.  She was found at last visit on 11/15/2021 to have several secondary/compensatory sources of pain inclusive of her left greater trochanteric region, piriformis at the static nerve junction, and SI joint.  She was advised prednisone course due to contraindication to NSAIDs, despite this she has noted persistent symptomatology. ? ?Examination shows stable tenderness at the left greater trochanter, left SI joint, left piriformis.  Given her persistent symptomatology, treatments today, she elected proceed with ultrasound-guided corticosteroid injections of the left greater trochanter, left SI joint, and left piriformis at the sciatic nerve. ? ?I have advised her on postinjection care as well as as needed low-dose prednisone that she can dose to bridge symptoms until cortisone takes effect, lastly home-based exercises.  She is to contact us in 2 weeks+ if suboptimal progress noted.  At that time can consider formal PT, advanced imaging. ? ?Chronic condition, symptomatic, Rx management ?

## 2021-11-21 NOTE — Assessment & Plan Note (Signed)
See additional assessment(s) for plan details. 

## 2021-11-21 NOTE — Patient Instructions (Addendum)
You have just been given a cortisone injection to reduce pain and inflammation. After the injection you may notice immediate relief of pain as a result of the Lidocaine. It is important to rest the area of the injection for 24 to 48 hours after the injection. There is a possibility of some temporary increased discomfort and swelling for up to 72 hours until the cortisone begins to work. If you do have pain, simply rest the joint and use ice. If you can tolerate over the counter medications, you can try Tylenol, Aleve, or Advil for added relief per package instructions. ?-Can dose low-dose prednisone daily on as-needed basis until steroid injection takes effect ?- Start home exercises by midweek next week ?- Contact our office at the 2-week mark or beyond for any lingering symptoms ?- Follow-up as needed ?

## 2021-11-21 NOTE — Progress Notes (Signed)
?  ? ?Primary Care / Sports Medicine Office Visit ? ?Patient Information:  ?Patient ID: Barbara Faulkner, female DOB: 1966-02-24 Age: 56 y.o. MRN: [id number]  ? ?Barbara Faulkner is a pleasant 56 y.o. female presenting with the following: ? ?Chief Complaint  ?Patient presents with  ? Chronic Left SI join pain  ?  Left, not any better. Wants injections  ? ? ?Vitals:  ? 11/21/21 1527  ?BP: 122/88  ?Pulse: 82  ?SpO2: 97%  ? ?Vitals:  ? 11/21/21 1527  ?Weight: 171 lb 9.6 oz (77.8 kg)  ?Height: 5\' 3"  (1.6 m)  ? ?Body mass index is 30.4 kg/m?. ? ?No results found.  ? ?Independent interpretation of notes and tests performed by another provider:  ? ?None ? ?Procedures performed:  ? ?Procedure:  Injection of left greater trochanter under ultrasound guidance. ?Ultrasound guidance utilized for out of plane approach to the left greater trochanter, hypoechoic region consistent with bursitis noted ?Samsung HS60 device utilized with permanent recording / reporting. ?Verbal informed consent obtained and verified. ?Skin prepped in a sterile fashion. ?Ethyl chloride for topical local analgesia.  ?Completed without difficulty and tolerated well. ?Medication: triamcinolone acetonide 40 mg/mL suspension for injection 1 mL total and 2 mL lidocaine 1% without epinephrine utilized for needle placement anesthetic ?Advised to contact for fevers/chills, erythema, induration, drainage, or persistent bleeding. ? ?Procedure:  Injection of left SI joint under ultrasound guidance. ?Ultrasound guidance utilized for in-plane approach to the left SI joint, hypoechoic response from injectate noted, no effusion ?Samsung HS60 device utilized with permanent recording / reporting. ?Verbal informed consent obtained and verified. ?Skin prepped in a sterile fashion. ?Ethyl chloride for topical local analgesia.  ?Completed without difficulty and tolerated well. ?Medication: triamcinolone acetonide 40 mg/mL suspension for injection 1 mL total and 2 mL lidocaine 1%  without epinephrine utilized for needle placement anesthetic ?Advised to contact for fevers/chills, erythema, induration, drainage, or persistent bleeding. ? ?Procedure:  Injection of left piriformis at the sciatic under ultrasound guidance. ?Ultrasound guidance utilized for in-plane approach to the piriformis junction with the sciatic nerve, no abnormal echogenicity of the piriformis muscle visualized ?Samsung HS60 device utilized with permanent recording / reporting. ?Verbal informed consent obtained and verified. ?Skin prepped in a sterile fashion. ?Ethyl chloride for topical local analgesia.  ?Completed without difficulty and tolerated well. ?Medication: triamcinolone acetonide 40 mg/mL suspension for injection 1 mL total and 2 mL lidocaine 1% without epinephrine utilized for needle placement anesthetic ?Advised to contact for fevers/chills, erythema, induration, drainage, or persistent bleeding. ? ? ?Pertinent History, Exam, Impression, and Recommendations:  ? ?Greater 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 ?Patient returns for close follow-up regarding left hip pain in the setting of underlying hip osteoarthritis.  She was found at last visit on 11/15/2021 to have several secondary/compensatory sources of pain inclusive of her left greater trochanteric region, piriformis at the static nerve junction, and SI joint.  She was advised prednisone course due to contraindication to NSAIDs, despite this she has noted persistent symptomatology. ? ?Examination shows stable tenderness at the left greater trochanter, left SI joint, left piriformis.  Given her persistent symptomatology, treatments today, she elected proceed with ultrasound-guided corticosteroid injections of the left greater trochanter, left SI joint, and left piriformis at the sciatic nerve. ? ?I have advised her on postinjection care as well as as needed low-dose prednisone that she can dose to bridge symptoms until cortisone takes effect,  lastly home-based exercises.  She is to contact 11/17/2021 in 2 weeks+  if suboptimal progress noted.  At that time can consider formal PT, advanced imaging. ? ?Chronic condition, symptomatic, Rx management ? ?Piriformis syndrome, left ?See additional assessment(s) for plan details. ? ?Chronic left SI joint pain ?See additional assessment(s) for plan details.  ? ?Orders & Medications ?Meds ordered this encounter  ?Medications  ? predniSONE (DELTASONE) 10 MG tablet  ?  Sig: Take 1 tablet (10 mg total) by mouth daily as needed (with breakfast for hip pain).  ?  Dispense:  10 tablet  ?  Refill:  0  ? triamcinolone acetonide (KENALOG-40) injection 120 mg  ? ?Orders Placed This Encounter  ?Procedures  ? [place] LIMITED JOINT SPACE STRUCTURES LOW LEFT  ?  ? ?Return if symptoms worsen or fail to improve.  ?  ? ?[name], MD ? ? Primary Care Sports Medicine ?[facility] ?[facility] [place]  ? ?

## 2021-11-28 ENCOUNTER — Ambulatory Visit
Admission: RE | Admit: 2021-11-28 | Discharge: 2021-11-28 | Disposition: A | Discharge status: Home / Self Care | Payer: Federal, State, Local not specified - PPO | Source: Ambulatory Visit | Attending: Internal Medicine | Admitting: Internal Medicine

## 2021-11-28 DIAGNOSIS — Z1231 Encounter for screening mammogram for malignant neoplasm of breast: Secondary | ICD-10-CM | POA: Insufficient documentation

## 2021-12-02 ENCOUNTER — Other Ambulatory Visit: Payer: Self-pay | Admitting: Internal Medicine

## 2021-12-02 DIAGNOSIS — E559 Vitamin D deficiency, unspecified: Secondary | ICD-10-CM

## 2021-12-05 DIAGNOSIS — I10 Essential (primary) hypertension: Secondary | ICD-10-CM | POA: Diagnosis not present

## 2021-12-05 DIAGNOSIS — E559 Vitamin D deficiency, unspecified: Secondary | ICD-10-CM | POA: Diagnosis not present

## 2021-12-05 DIAGNOSIS — N1831 Chronic kidney disease, stage 3a: Secondary | ICD-10-CM | POA: Diagnosis not present

## 2021-12-05 DIAGNOSIS — N2581 Secondary hyperparathyroidism of renal origin: Secondary | ICD-10-CM | POA: Diagnosis not present

## 2021-12-06 ENCOUNTER — Other Ambulatory Visit: Payer: Self-pay | Admitting: Student in an Organized Health Care Education/Training Program

## 2021-12-06 DIAGNOSIS — G894 Chronic pain syndrome: Secondary | ICD-10-CM

## 2021-12-06 DIAGNOSIS — L4050 Arthropathic psoriasis, unspecified: Secondary | ICD-10-CM

## 2021-12-06 DIAGNOSIS — G8929 Other chronic pain: Secondary | ICD-10-CM

## 2021-12-06 DIAGNOSIS — A6920 Lyme disease, unspecified: Secondary | ICD-10-CM

## 2021-12-17 DIAGNOSIS — F419 Anxiety disorder, unspecified: Secondary | ICD-10-CM | POA: Diagnosis not present

## 2021-12-17 DIAGNOSIS — F5105 Insomnia due to other mental disorder: Secondary | ICD-10-CM | POA: Diagnosis not present

## 2021-12-17 DIAGNOSIS — F39 Unspecified mood [affective] disorder: Secondary | ICD-10-CM | POA: Diagnosis not present

## 2021-12-17 DIAGNOSIS — F4312 Post-traumatic stress disorder, chronic: Secondary | ICD-10-CM | POA: Diagnosis not present

## 2021-12-18 ENCOUNTER — Encounter: Payer: Self-pay | Admitting: Student in an Organized Health Care Education/Training Program

## 2021-12-19 ENCOUNTER — Other Ambulatory Visit: Payer: Self-pay | Admitting: *Deleted

## 2021-12-23 ENCOUNTER — Other Ambulatory Visit: Payer: Self-pay | Admitting: Student in an Organized Health Care Education/Training Program

## 2021-12-23 DIAGNOSIS — L4050 Arthropathic psoriasis, unspecified: Secondary | ICD-10-CM

## 2021-12-23 DIAGNOSIS — M1612 Unilateral primary osteoarthritis, left hip: Secondary | ICD-10-CM

## 2021-12-23 MED ORDER — GABAPENTIN 400 MG PO CAPS: 400.0000 mg | ORAL_CAPSULE | Freq: Every day | ORAL | 2 refills | Status: DC

## 2021-12-23 NOTE — Progress Notes (Signed)
Patient notified

## 2022-01-10 ENCOUNTER — Ambulatory Visit (INDEPENDENT_AMBULATORY_CARE_PROVIDER_SITE_OTHER): Payer: Federal, State, Local not specified - PPO | Admitting: *Deleted

## 2022-01-10 DIAGNOSIS — Z23 Encounter for immunization: Secondary | ICD-10-CM

## 2022-01-17 ENCOUNTER — Ambulatory Visit: Payer: Federal, State, Local not specified - PPO | Admitting: Family Medicine

## 2022-01-17 ENCOUNTER — Encounter: Payer: Self-pay | Admitting: Family Medicine

## 2022-01-17 VITALS — BP 136/100 | HR 98 | Ht 63.0 in | Wt 167.0 lb

## 2022-01-17 DIAGNOSIS — M7918 Myalgia, other site: Secondary | ICD-10-CM

## 2022-01-17 DIAGNOSIS — G8929 Other chronic pain: Secondary | ICD-10-CM

## 2022-01-17 DIAGNOSIS — M1612 Unilateral primary osteoarthritis, left hip: Secondary | ICD-10-CM | POA: Diagnosis not present

## 2022-01-17 MED ORDER — CYCLOBENZAPRINE HCL 5 MG PO TABS
5.0000 mg | ORAL_TABLET | Freq: Three times a day (TID) | ORAL | 0 refills | Status: DC | PRN
Start: 2022-01-17 — End: 2022-03-27

## 2022-01-17 MED ORDER — DULOXETINE HCL 30 MG PO CPEP: ORAL_CAPSULE | ORAL | 0 refills | Status: DC

## 2022-01-17 NOTE — Assessment & Plan Note (Addendum)
Chronic issue with ongoing symptomatology, of note patient recently underwent intra-articular injection on 09/16/2021, additionally injections about the piriformis, SI joint, and trochanteric bursa were performed on 11/21/2021.  Unfortunate, she has begun to note pain recurring over the past 1 month timeframe, denies any change in activity or relative overuse.  Pain localized to the left groin without radiation.  Examination reveals positive FADIR that recreates her pain, otherwise generalized tenderness about the previous injected structures again noted.  Given somewhat recalcitrant nature of pain, plan for MRI left hip without contrast.  From a treatment standpoint, given recent multiple injections, we will hold from further injections and tackle chronic musculoskeletal pain with initiation of duloxetine at 30 mg, titrate to 60 mg, and maintain follow-up in 1 month.  Pending symptoms, can revisit intra-articular cortisone injection.

## 2022-01-17 NOTE — Assessment & Plan Note (Signed)
See additional assessment(s) for plan details. 

## 2022-01-17 NOTE — Progress Notes (Signed)
     Primary Care / Sports Medicine Office Visit  Patient Information:  Patient ID: YENIFER SACCENTE, female DOB: 1966-04-02 Age: 56 y.o. MRN: [id number]   NANCI LAKATOS is a pleasant 56 y.o. female presenting with the following:  Chief Complaint  Patient presents with   Hip Pain    Left, injection     Vitals:   01/17/22 1453  BP: (!) 136/100  Pulse: 98  SpO2: 99%   Vitals:   01/17/22 1453  Weight: 167 lb (75.8 kg)  Height: 5\' 3"  (1.6 m)   Body mass index is 29.58 kg/m.  No results found.   Independent interpretation of notes and tests performed by another provider:   None  Procedures performed:   None  Pertinent History, Exam, Impression, and Recommendations:   Problem List Items Addressed This Visit       Musculoskeletal and Integument   Primary osteoarthritis of left hip - Primary    Chronic issue with ongoing symptomatology, of note patient recently underwent intra-articular injection on 09/16/2021, additionally injections about the piriformis, SI joint, and trochanteric bursa were performed on 11/21/2021.  Unfortunate, she has begun to note pain recurring over the past 1 month timeframe, denies any change in activity or relative overuse.  Pain localized to the left groin without radiation.  Examination reveals positive FADIR that recreates her pain, otherwise generalized tenderness about the previous injected structures again noted.  Given somewhat recalcitrant nature of pain, plan for MRI left hip without contrast.  From a treatment standpoint, given recent multiple injections, we will hold from further injections and tackle chronic musculoskeletal pain with initiation of duloxetine at 30 mg, titrate to 60 mg, and maintain follow-up in 1 month.  Pending symptoms, can revisit intra-articular cortisone injection.        Relevant Medications   DULoxetine (CYMBALTA) 30 MG capsule   cyclobenzaprine (FLEXERIL) 5 MG tablet   Other Relevant Orders   MR HIP LEFT WO  CONTRAST     Other   Chronic musculoskeletal pain    See additional assessment(s) for plan details.       Relevant Medications   DULoxetine (CYMBALTA) 30 MG capsule   cyclobenzaprine (FLEXERIL) 5 MG tablet     Orders & Medications Meds ordered this encounter  Medications   DULoxetine (CYMBALTA) 30 MG capsule    Sig: Take 1 capsule (30 mg total) by mouth daily for 7 days, THEN 2 capsules (60 mg total) daily for 23 days.    Dispense:  60 capsule    Refill:  0   cyclobenzaprine (FLEXERIL) 5 MG tablet    Sig: Take 1-2 tablets (5-10 mg total) by mouth 3 (three) times daily as needed for muscle spasms.    Dispense:  14 tablet    Refill:  0   Orders Placed This Encounter  Procedures   MR HIP LEFT WO CONTRAST     Return in about 4 weeks (around 02/14/2022).     02/16/2022, MD   Primary Care Sports Medicine [facility]

## 2022-01-23 ENCOUNTER — Ambulatory Visit
Admission: RE | Admit: 2022-01-23 | Discharge: 2022-01-23 | Disposition: A | Discharge status: Home / Self Care | Payer: Federal, State, Local not specified - PPO | Source: Ambulatory Visit | Attending: Family Medicine | Admitting: Family Medicine

## 2022-01-23 DIAGNOSIS — G8929 Other chronic pain: Secondary | ICD-10-CM | POA: Diagnosis not present

## 2022-01-23 DIAGNOSIS — M24152 Other articular cartilage disorders, left hip: Secondary | ICD-10-CM | POA: Diagnosis not present

## 2022-01-23 DIAGNOSIS — M1612 Unilateral primary osteoarthritis, left hip: Secondary | ICD-10-CM | POA: Diagnosis not present

## 2022-01-23 DIAGNOSIS — R600 Localized edema: Secondary | ICD-10-CM | POA: Diagnosis not present

## 2022-02-04 ENCOUNTER — Ambulatory Visit
Payer: Federal, State, Local not specified - PPO | Attending: Student in an Organized Health Care Education/Training Program | Admitting: Student in an Organized Health Care Education/Training Program

## 2022-02-04 ENCOUNTER — Encounter: Payer: Self-pay | Admitting: Student in an Organized Health Care Education/Training Program

## 2022-02-04 VITALS — BP 121/83 | HR 79 | Temp 97.2°F | Ht 63.0 in | Wt 164.0 lb

## 2022-02-04 DIAGNOSIS — M19011 Primary osteoarthritis, right shoulder: Secondary | ICD-10-CM | POA: Insufficient documentation

## 2022-02-04 DIAGNOSIS — M1612 Unilateral primary osteoarthritis, left hip: Secondary | ICD-10-CM | POA: Diagnosis not present

## 2022-02-04 DIAGNOSIS — G894 Chronic pain syndrome: Secondary | ICD-10-CM | POA: Insufficient documentation

## 2022-02-04 DIAGNOSIS — F32A Depression, unspecified: Secondary | ICD-10-CM | POA: Insufficient documentation

## 2022-02-04 DIAGNOSIS — G8929 Other chronic pain: Secondary | ICD-10-CM | POA: Insufficient documentation

## 2022-02-04 DIAGNOSIS — M25512 Pain in left shoulder: Secondary | ICD-10-CM | POA: Diagnosis not present

## 2022-02-04 DIAGNOSIS — L4050 Arthropathic psoriasis, unspecified: Secondary | ICD-10-CM | POA: Insufficient documentation

## 2022-02-04 DIAGNOSIS — M25511 Pain in right shoulder: Secondary | ICD-10-CM | POA: Diagnosis not present

## 2022-02-04 DIAGNOSIS — F419 Anxiety disorder, unspecified: Secondary | ICD-10-CM | POA: Diagnosis not present

## 2022-02-04 DIAGNOSIS — A6920 Lyme disease, unspecified: Secondary | ICD-10-CM | POA: Diagnosis not present

## 2022-02-04 DIAGNOSIS — M2550 Pain in unspecified joint: Secondary | ICD-10-CM | POA: Insufficient documentation

## 2022-02-04 DIAGNOSIS — M797 Fibromyalgia: Secondary | ICD-10-CM | POA: Insufficient documentation

## 2022-02-04 MED ORDER — TRAMADOL HCL ER 200 MG PO TB24: 200.0000 mg | ORAL_TABLET | Freq: Every day | ORAL | 2 refills | Status: AC

## 2022-02-04 MED ORDER — OXYCODONE-ACETAMINOPHEN 5-325 MG PO TABS: 1.0000 | ORAL_TABLET | Freq: Three times a day (TID) | ORAL | 0 refills | Status: AC | PRN

## 2022-02-04 NOTE — Progress Notes (Signed)
Nursing Pain Medication Assessment:  Safety precautions to be maintained throughout the outpatient stay will include: orient to surroundings, keep bed in low position, maintain call bell within reach at all times, provide assistance with transfer out of bed and ambulation.  Medication Inspection Compliance: Pill count conducted under aseptic conditions, in front of the patient. Neither the pills nor the bottle was removed from the patient's sight at any time. Once count was completed pills were immediately returned to the patient in their original bottle.  Medication #1: Oxycodone/APAP Pill/Patch Count:  0 of 90 pills remain Pill/Patch Appearance: Markings consistent with prescribed medication Bottle Appearance: Standard pharmacy container. Clearly labeled. Filled Date: 3 / 58 / 2023 Last Medication intake:  Ran out of medicine more than 48 hours ago  Medication #2: Tramadol (Ultram) Pill/Patch Count:  17 of 30 pills remain Pill/Patch Appearance: Markings consistent with prescribed medication Bottle Appearance: Standard pharmacy container. Clearly labeled. Filled Date: 5 / 26 / 2023 Last Medication intake:  TodaySafety precautions to be maintained throughout the outpatient stay will include: orient to surroundings, keep bed in low position, maintain call bell within reach at all times, provide assistance with transfer out of bed and ambulation.

## 2022-02-04 NOTE — Progress Notes (Signed)
PROVIDER NOTE: Information contained herein reflects review and annotations entered in association with encounter. Interpretation of such information and data should be left to medically-trained personnel. Information provided to patient can be located elsewhere in the medical record under "Patient Instructions". Document created using STT-dictation technology, any transcriptional errors that may result from process are unintentional.    Patient: Barbara Faulkner  Service Category: E/M  Provider: Gillis Santa, MD  DOB: Apr 22, 1966  DOS: 02/26/2021  Specialty: Interventional Pain Management  MRN: [id number]  Setting: Ambulatory outpatient  PCP: McLean-Scocuzza, Barbara Glow, MD  Type: Established Patient    Referring Provider: Orland Mustard *  Location: Office  Delivery: Face-to-face     HPI  Barbara Faulkner, a 56 y.o. year old female, is here today because of her Psoriatic arthritis ([place]) [L40.50]. 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 Pain Assessment: Severity of Chronic pain is reported as a 6 /10. Location: Hip Left/pain radiaties down her hip to the back of her thigh on the left. Onset: More than a month ago. Quality: Sharp, Constant, Aching. Timing: Constant. Modifying factor(s): meds. Vitals:  height is _0  (1.6 m) and weight is 164 lb (74.4 kg). Her temperature is 97.2 F (36.2 C) (abnormal). Her blood pressure is 121/83 and her pulse is 79. Her oxygen saturation is 100%.   Reason for encounter: medication management.     [name] follows up today for medication management.  No significant change in her medical history since her last clinic visit with me.  She had a  recent MRI done of her left hip which shows severe left hip osteoarthritis.  She has been receiving intra-articular hip steroid injections with Dr. Zigmund Daniel that have been helpful for her for short period of time.  They do help her function better and help her perform ADLs more comfortably.  She wants to hold off on a hip replacement at this time which I agree with.  Otherwise we will refill her tramadol and oxycodone as below, no change in dose.  UDS up-to-date and appropriate.  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  Instructed to avoid NSAIDs and nephrotoxic medications.  Pharmacotherapy Assessment   Analgesic: Tramadol 200 mg ER, continue oxycodone 5 mg daily for breakthrough pain.     Monitoring: [place] PMP: PDMP reviewed during this encounter.       Pharmacotherapy: No side-effects or adverse reactions reported. Compliance: No problems identified. Effectiveness: Clinically acceptable.  UDS:  Summary  Date Value Ref Range Status  05/21/2021 Note  Final    Comment:    ==================================================================== [employer] Select 13 (MW) ==================================================================== Test                             Result       Flag       Units  Drug Present and Declared for Prescription Verification   Tramadol                       1367         EXPECTED   ng/mg creat   O-Desmethyltramadol  1186         EXPECTED   ng/mg creat   N-Desmethyltramadol            233          EXPECTED   ng/mg creat    Source of tramadol is a prescription medication. O-desmethyltramadol    and N-desmethyltramadol are expected metabolites of tramadol.  Drug Absent but Declared for Prescription Verification   Oxycodone                      Not Detected UNEXPECTED ng/mg creat ==================================================================== Test                      Result    Flag   Units      Ref Range   Creatinine              194               mg/dL      >=20 ==================================================================== Declared Medications:  The flagging and interpretation on this report are based on the  following declared medications.  Unexpected results may arise from  inaccuracies in the declared medications.   **Note: The testing scope of this panel includes these medications:   Oxycodone (Percocet)  Tramadol (Ultram)   **Note: The testing scope of this panel does not include the  following reported medications:   Acetaminophen (Percocet)  Albuterol (Ventolin HFA)  Cholecalciferol  Diclofenac (Voltaren)  Fluticasone (Flonase)  Gabapentin (Neurontin)  Lamotrigine (Lamictal)  Methocarbamol (Robaxin)  Pantoprazole (Protonix)  Quetiapine (Seroquel)  Rizatriptan (Maxalt) ==================================================================== For clinical consultation, please call [phone]. ====================================================================       ROS  Constitutional: Denies any fever or chills Gastrointestinal: No reported hemesis, hematochezia, vomiting, or acute GI distress Musculoskeletal:   bilateral hip pain left>right Neurological: No reported episodes of acute onset apraxia, aphasia, dysarthria, agnosia, amnesia, paralysis, loss of coordination, or loss of consciousness  Medication Review  Cholecalciferol, DULoxetine, QUEtiapine, Vitamin D, calcitRIOL, cyclobenzaprine, gabapentin, lamoTRIgine, oxyCODONE-acetaminophen, and traMADol  History Review  Allergy: Barbara Faulkner is allergic to shellfish allergy and nsaids. Drug: Barbara Faulkner  reports no history of drug use. Alcohol:  reports that she does not currently use alcohol. Tobacco:  reports that she quit smoking about 18 years ago. Her smoking use included cigarettes. She has a 40.00 pack-year smoking history. She has never used smokeless tobacco. Social: Barbara Faulkner  reports that she quit smoking about 18 years ago.  Her smoking use included cigarettes. She has a 40.00 pack-year smoking history. She has never used smokeless tobacco. She reports that she does not currently use alcohol. She reports that she does not use drugs. Medical:  has a past medical history of Allergy, Anxiety, Chronic kidney disease, Chronic pain, Chronic sinusitis, COVID-19, Depression, Heart murmur, Hiatal hernia, HPV (human papilloma virus) anogenital infection, Macromastia, Migraine, Psoriatic arthritis ([place]), Shingles, and UTI (urinary tract infection). Surgical: Barbara Faulkner  has a past surgical history that includes Cholecystectomy (2009); Tubal ligation (1997); Laparoscopic gastric banding (2008); Gastric bypass; Breast biopsy (Right, 01/28/2018); Colonoscopy with propofol (N/A, 07/19/2018); Esophagogastroduodenoscopy (egd) with propofol (N/A, 07/19/2018); Breast reduction surgery (Bilateral, 04/03/2020); Colonoscopy with propofol (N/A, 09/17/2021); Colonoscopy with propofol (N/A, 09/18/2021); and Reduction mammaplasty. Family: family history includes HIV in her father; Hepatitis in her maternal uncle; Hyperlipidemia in her mother; Hypertension in her mother; Lung cancer in her maternal grandfather.  Laboratory Chemistry Profile   Renal Lab Results  Component Value  Date   BUN 19 09/06/2021   CREATININE 1.07 90/24/0973   BCR NOT APPLICABLE 53/29/9242   GFR 59.41 (L) 09/06/2021   GFRAA >60 02/15/2014   GFRNONAA >60 02/15/2014     Hepatic Lab Results  Component Value Date   AST 56 (H) 09/06/2021   ALT 53 (H) 09/06/2021   ALBUMIN 4.1 09/06/2021   ALKPHOS 206 (H) 09/06/2021   AMYLASE 33 09/25/2014   LIPASE 124 10/04/2013     Electrolytes Lab Results  Component Value Date   NA 142 09/06/2021   K 3.9 09/06/2021   CL 113 (H) 09/06/2021   CALCIUM 7.8 (L) 09/06/2021   MG 1.9 07/14/2018   PHOS 5.0 (H) 09/25/2014     Bone Lab Results  Component Value Date   VD25OH 9.94 (L) 09/06/2021     Inflammation (CRP: Acute Phase)  (ESR: Chronic Phase) Lab Results  Component Value Date   CRP <1.0 03/10/2019   ESRSEDRATE 3 03/10/2019       Note: Above Lab results reviewed.  Recent Imaging Review  MR HIP LEFT WO CONTRAST CLINICAL DATA:  Chronic hip pain.  Osteoarthritis suspected.  EXAM: MR OF THE LEFT HIP WITHOUT CONTRAST  TECHNIQUE: Multiplanar, multisequence MR imaging was performed. No intravenous contrast was administered.  COMPARISON:  Bilateral hip radiographs 09/06/2021  FINDINGS: Bones: No acute fracture or avascular necrosis is seen within the visualized portions of the pelvis or either proximal femur. Moderate pubic symphysis joint space narrowing and peripheral osteophytosis.  Left hip:  Articular cartilage and labrum  Articular cartilage: Moderate to high-grade posterosuperior left femoral head and acetabular cartilage thinning, subchondral cystic change and surrounding marrow edema. Moderate anterior superior left femoral head cartilage thinning with high-grade thinning of the anterior 9-10 o'clock cartilage with focal subchondral marrow edema (sagittal series 6, image 10). Mild-to-moderate anterior superior left acetabular subchondral degenerative cystic change.  Labrum: Mild-to-moderate attenuation and degenerative irregularity of the anterior superior left acetabular labrum.  Right hip:  Articular cartilage and labrum  Articular cartilage: On large field-of-view images, there is at least moderate thinning of the superomedial right femoral head and acetabular cartilage with moderate right superomedial and posterior femoral head subchondral degenerative cystic change.  Joint or bursal effusion  Joint effusion:  No joint effusion within either hip.  Bursae: No trochanteric bursitis on either side.  Muscles and tendons  Muscles and tendons: The origins of the bilateral rectus femoris tendons and the bilateral common hamstring tendon origins are intact. The insertions of  the bilateral gluteus minimus gluteus medius, and iliopsoas tendons are intact. The rectus abdominis-adductor aponeuroses are intact.  Other findings  Miscellaneous: No gross abnormality is seen within the intrapelvic soft tissues.  IMPRESSION: 1. 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 2. Moderate pubic symphysis osteoarthritis.  Electronically Signed   By: Yvonne Kendall M.D.   On: 01/24/2022 11:50  Note: Reviewed        Physical Exam  General appearance: Well nourished, well developed, and well hydrated. In no apparent acute distress Mental status: Alert, oriented x 3 (person, place, & time)       Respiratory: No evidence of acute respiratory distress Eyes: PERLA Vitals: BP 121/83   Pulse 79   Temp (!) 97.2 F (36.2 C)   Ht _0  (1.6 m)   Wt 164 lb (74.4 kg)   LMP 05/04/2014   SpO2 100%   BMI 29.05 kg/m  BMI: Estimated body mass index is 29.05 kg/m as calculated from the  following:   Height as of this encounter: _0  (1.6 m).   Weight as of this encounter: 164 lb (74.4 kg). Ideal: Ideal body weight: 52.4 kg (115 lb 8.3 oz) Adjusted ideal body weight: 61.2 kg (134 lb 14.6 oz)    Left hip pain, with radiation into left groin and overlying trochanteric bursa   Assessment   Status Diagnosis  Persistent Persistent Controlled 1. Psoriatic arthritis ([place])   2. Primary osteoarthritis of left hip   3. 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 Glenohumeral arthritis, right   5. Fibromyalgia   6. Chronic pain syndrome   7. Anxiety and depression   8. Chronic pain of both shoulders         Plan of Care  Barbara Faulkner has a current medication list which includes the following long-term medication(s): duloxetine, gabapentin, and quetiapine.  Pharmacotherapy (Medications Ordered): Meds ordered this encounter  Medications   traMADol (ULTRAM-ER) 200 MG 24 hr tablet    Sig: Take 1 tablet (200 mg total) by mouth daily.     Dispense:  30 tablet    Refill:  2   oxyCODONE-acetaminophen (PERCOCET) 5-325 MG tablet    Sig: Take 1 tablet by mouth every 8 (eight) hours as needed for severe pain.    Dispense:  90 tablet    Refill:  0    Chronic Pain. (STOP Act - Not applicable). Fill one day early if closed on scheduled refill date.   Percocet prescription as above for quantity 90 to last 3 months UDS up-to-date and appropriate.   Follow-up plan:   Return in about 3 months (around 05/07/2022) for Medication Management, in person.   Recent Visits Date Type Provider Dept  11/07/21 Office Visit Barbara Santa, MD [facility]  Showing recent visits within past 90 days and meeting all other requirements Today's Visits Date Type Provider Dept  02/04/22 Office Visit Barbara Santa, MD [facility]  Showing today's visits and meeting all other requirements Future Appointments No visits were found meeting these conditions. Showing future appointments within next 90 days and meeting all other requirements  I discussed the assessment and treatment plan with the patient. The patient was provided an opportunity to ask questions and all were answered. The patient agreed with the plan and demonstrated an understanding of the instructions.  Patient advised to call back or seek an in-person evaluation if the symptoms or condition worsens.  Duration of encounter:57mnutes.  Note by: BGillis Santa MD Date: 02/04/2022; Time: 2:11 PM

## 2022-02-24 ENCOUNTER — Other Ambulatory Visit: Payer: Self-pay

## 2022-02-24 DIAGNOSIS — M1612 Unilateral primary osteoarthritis, left hip: Secondary | ICD-10-CM

## 2022-02-24 MED ORDER — DULOXETINE HCL 60 MG PO CPEP: 60.0000 mg | ORAL_CAPSULE | Freq: Every day | ORAL | 3 refills | Status: DC

## 2022-02-27 ENCOUNTER — Ambulatory Visit: Payer: Federal, State, Local not specified - PPO | Admitting: Family Medicine

## 2022-03-06 ENCOUNTER — Encounter: Payer: Self-pay | Admitting: Internal Medicine

## 2022-03-06 ENCOUNTER — Ambulatory Visit (INDEPENDENT_AMBULATORY_CARE_PROVIDER_SITE_OTHER): Payer: Federal, State, Local not specified - PPO | Admitting: Internal Medicine

## 2022-03-06 VITALS — BP 114/78 | HR 90 | Temp 97.6°F | Resp 21 | Ht 63.0 in | Wt 167.6 lb

## 2022-03-06 DIAGNOSIS — Z0000 Encounter for general adult medical examination without abnormal findings: Secondary | ICD-10-CM

## 2022-03-06 DIAGNOSIS — N2581 Secondary hyperparathyroidism of renal origin: Secondary | ICD-10-CM | POA: Diagnosis not present

## 2022-03-06 DIAGNOSIS — N1831 Chronic kidney disease, stage 3a: Secondary | ICD-10-CM

## 2022-03-06 DIAGNOSIS — I10 Essential (primary) hypertension: Secondary | ICD-10-CM | POA: Diagnosis not present

## 2022-03-06 DIAGNOSIS — Z1231 Encounter for screening mammogram for malignant neoplasm of breast: Secondary | ICD-10-CM

## 2022-03-06 DIAGNOSIS — K760 Fatty (change of) liver, not elsewhere classified: Secondary | ICD-10-CM

## 2022-03-06 NOTE — Progress Notes (Signed)
Chief Complaint  Patient presents with   Follow-up    Follow up   Annual Exam   Annual doing well  1. H/o elevated lfts bx mild steatosis and autoimmune w/u neg though due to zoloft and trazadone but these meds changed and still elevated lfts  2. Ckd 3a f/u renal Q3 months    Review of Systems  Constitutional:  Negative for weight loss.  HENT:  Negative for hearing loss.   Eyes:  Negative for blurred vision.  Respiratory:  Negative for shortness of breath.   Cardiovascular:  Negative for chest pain.  Gastrointestinal:  Negative for abdominal pain and blood in stool.  Genitourinary:  Negative for dysuria.  Musculoskeletal:  Negative for falls and joint pain.  Skin:  Negative for rash.  Neurological:  Negative for headaches.  Psychiatric/Behavioral:  Negative for depression.    Past Medical History:  Diagnosis Date   Allergy    Anxiety    Chronic kidney disease    Chronic pain    Chronic sinusitis    COVID-19    09/14/20, 06/13/21   Depression    Heart murmur    Hiatal hernia    HPV (human papilloma virus) anogenital infection    Macromastia    Migraine    Psoriatic arthritis ([place])    Shingles    UTI (urinary tract infection)    ecoli 09/2021   Past Surgical History:  Procedure Laterality Date   BREAST BIOPSY Right 01/28/2018   US guided biopsy - heart shaped   BREAST REDUCTION SURGERY Bilateral 04/03/2020   Procedure: MAMMARY REDUCTION  (BREAST);  Surgeon: Contogiannis, Audrea Muscat, MD;  Location: [place];  Service: Plastics;  Laterality: Bilateral;  HAVE LIPOSUCTION MACHINE AVAILABLE   CHOLECYSTECTOMY  2009   COLONOSCOPY WITH PROPOFOL N/A 07/19/2018   Procedure: COLONOSCOPY WITH PROPOFOL;  Surgeon: Lin Landsman, MD;  Location[facility] END[facility]COPY;  Service: Gastroenterology;  Laterality: N/A;   COLONOSCOPY WITH PROPOFOL N/A 09/17/2021   Procedure: COLONOSCOPY WITH PROPOFOL;  Surgeon: Lin Landsman, MD;  Location: [facility] ENDOSCOPY;  Service:  Gastroenterology;  Laterality: N/A;   COLONOSCOPY WITH PROPOFOL N/A 09/18/2021   Procedure: COLONOSCOPY WITH PROPOFOL;  Surgeon: Lin Landsman, MD;  Location: [facility] ENDOSCOPY;  Service: Gastroenterology;  Laterality: N/A;   ESOPHAGOGASTRODUODENOSCOPY (EGD) WITH PROPOFOL N/A 07/19/2018   Procedure: ESOPHAGOGASTRODUODENOSCOPY (EGD) WITH PROPOFOL;  Surgeon: Lin Landsman, MD;  Location: [facility] ENDOSCOPY;  Service: Gastroenterology;  Laterality: N/A;   GASTRIC BYPASS     2015; duodenal switch    LAPAROSCOPIC GASTRIC BANDING  2008   removed 2009   REDUCTION MAMMAPLASTY     TUBAL LIGATION  1997   Family History  Problem Relation Age of Onset   Hypertension Mother    Hyperlipidemia Mother    Stroke Mother        age 9   HIV Father    Lung cancer Maternal Grandfather        smoker   Hepatitis Maternal Uncle        drug use   Colon cancer Neg Hx    Colon polyps Neg Hx    Rectal cancer Neg Hx    Stomach cancer Neg Hx    Breast cancer Neg Hx    Social History   Socioeconomic History   Marital status: Married    Spouse name: Leialoha Cease   Number of children: 2   Years of education: Not on file   Highest education level: Not on file  Occupational History   Occupation: Forensic scientist, Publishing rights manager: [employer]  Tobacco Use   Smoking status: Former    Packs/day: 2.00    Years: 20.00    Total pack years: 40.00    Types: Cigarettes    Quit date: 09/02/2003    Years since quitting: 18.5   Smokeless tobacco: Never  Vaping Use   Vaping Use: Never used  Substance and Sexual Activity   Alcohol use: Not Currently   Drug use: Never   Sexual activity: Yes    Partners: Male    Birth control/protection: Post-menopausal  Other Topics Concern   Not on file  Social History Narrative   Married.   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    Has 2 dogs and 2 cats   Social Determinants of Corporate investment banker Strain: Not on file  Food Insecurity: Not on file  Transportation Needs: Not on file  Physical Activity: Not on file  Stress: Not on file  Social Connections: Not on file  Intimate Partner Violence: Not on file   Current Meds  Medication Sig   calcitRIOL (ROCALTROL) 0.25 MCG capsule Take 0.25 mcg by mouth daily.   Cholecalciferol 1.25 MG (50000 UT) capsule Take 1 capsule (50,000 Units total) by mouth once a week. D3   cyclobenzaprine (FLEXERIL) 5 MG tablet Take 1-2 tablets (5-10 mg total) by mouth 3 (three) times daily as needed for muscle spasms.   DULoxetine (CYMBALTA) 60 MG capsule Take 1 capsule (60 mg total) by mouth daily.   gabapentin (NEURONTIN) 400 MG capsule Take 1 capsule (400 mg total) by mouth at bedtime.   lamoTRIgine (LAMICTAL) 150 MG tablet Take 150 mg by mouth daily.   oxyCODONE-acetaminophen (PERCOCET) 5-325 MG tablet Take 1 tablet by mouth every 8 (eight) hours as needed for severe pain.   QUEtiapine (SEROQUEL) 100 MG tablet Take 100 mg by mouth at bedtime.   traMADol (ULTRAM-ER) 200 MG 24 hr tablet Take 1 tablet (200 mg total) by mouth daily.   VITAMIN D PO Take by mouth daily at 12 noon.   Current Facility-Administered Medications for the 03/06/22 encounter (Office Visit) with McLean-Scocuzza, Pasty Spillers, MD  Medication   lidocaine (PF) (XYLOCAINE) 1 % injection 2 mL   Allergies  Allergen Reactions   Shellfish Allergy Swelling    REACTION: throat closing   Nsaids     All NSAIDS -h/o CKD 2-3    No results found for this or any previous visit (from the past 01/05/59 hour(s)). Objective  Body mass index is 29.69 kg/m. Wt Readings from Last 3 Encounters:  03/06/22 167 lb 9.6 oz (76 kg)  02/04/22 164 lb (74.4 kg)  01/17/22 167 lb (75.8 kg)   Temp Readings from Last 3 Encounters:  03/06/22 97.6 F (36.4 C) (Oral)  02/04/22 (!) 97.2 F (36.2 C)  11/07/21 98.2  F (36.8 C)   BP Readings from Last 3 Encounters:  03/06/22 114/78  02/04/22 121/83  01/17/22 (!) 136/100   Pulse Readings from Last 3 Encounters:  03/06/22 90  02/04/22 79  01/17/22 98    Physical Exam Vitals and nursing note reviewed.  Constitutional:      Appearance: Normal appearance. She is well-developed and well-groomed.  HENT:  Head: Normocephalic and atraumatic.  Eyes:     Conjunctiva/sclera: Conjunctivae normal.     Pupils: Pupils are equal, round, and reactive to light.  Cardiovascular:     Rate and Rhythm: Normal rate and regular rhythm.     Heart sounds: Normal heart sounds. No murmur heard. Pulmonary:     Effort: Pulmonary effort is normal.     Breath sounds: Normal breath sounds.  Abdominal:     General: Abdomen is flat. Bowel sounds are normal.     Tenderness: There is no abdominal tenderness.  Musculoskeletal:        General: No tenderness.  Skin:    General: Skin is warm and dry.  Neurological:     General: No focal deficit present.     Mental Status: She is alert and oriented to person, place, and time. Mental status is at baseline.     Cranial Nerves: Cranial nerves 2-12 are intact.     Motor: Motor function is intact.     Coordination: Coordination is intact.     Gait: Gait is intact.  Psychiatric:        Attention and Perception: Attention and perception normal.        Mood and Affect: Mood and affect normal.        Speech: Speech normal.        Behavior: Behavior normal. Behavior is cooperative.        Thought Content: Thought content normal.        Cognition and Memory: Cognition and memory normal.        Judgment: Judgment normal.     Assessment  Plan  Annual physical exam See below   Hepatic steatosis with ho elevated lfts  Autoimmune w/u neg  Was not due to zoloft/trazadone   Stage 3a chronic kidney disease (HCC)  F/u [facility]   HM Flu and Tdap had 09/20/21  Shingrix 2/2 Prevnar 20 utd  2/2 pfizer disc  booster Consider  twinrix in future mild hepatic steatosis   Pap 08/24/18 +HPV negative pap refer to ob/GYN in 08/2019 Dr. Cherly Hensen check to see if went   roi from 2020 pap Dr. Cherly Hensen  F/u ob/gyn pap   abnormal mammogram bx neg necrosis and cystic change due 01/19/19 nl breast exam right axillary lymph node  -mammo 03/11/19 negative  -ordered for 2022 [place] neg 11/27/20  Ordered 11/28/21 negative mammogram     EGD/colonoscopy 07/19/18 ext hemorrhoids erosive gastritis bx mild gastritis  Utd colonoscopy due in 3 years 09/18/24      Former smoker quit 15 years ago smoked 10-13 years 1ppd    rec healthy diet and exercise    04/27/19 saw Dr. Dierdre Forth (rheumatology) initially tx'ed psoriatic arthritis 2014 Dr. Koren Bound took MTX x 2 months w/o help hand Xray no evidence of erosions of bone no tx requreed   Specialists  Est renal   f/u pain clinic   Psychiatry Dr. [name] Consider paxil fo cymbalta or gabapentin to lyrica which could be more beneficial per note  F/u in 6 months  Psych Dr. [name] therapy SEL Provider: Dr. French Ana McLean-Scocuzza-Internal Medicine

## 2022-03-06 NOTE — Patient Instructions (Addendum)
Barbara A. Cousins, MD-ob/gyn call for appt 08/2022 pap smear due  4.9 199 Google reviews Obstetrician-gynecologist in [place], [place] Get online care: [email or web address] Address: [address], [place], [place] [address] Hours:  Open ? Closes 5?PM Phone: [phone]

## 2022-03-17 DIAGNOSIS — F4312 Post-traumatic stress disorder, chronic: Secondary | ICD-10-CM | POA: Diagnosis not present

## 2022-03-17 DIAGNOSIS — F39 Unspecified mood [affective] disorder: Secondary | ICD-10-CM | POA: Diagnosis not present

## 2022-03-17 DIAGNOSIS — F419 Anxiety disorder, unspecified: Secondary | ICD-10-CM | POA: Diagnosis not present

## 2022-03-17 DIAGNOSIS — F5105 Insomnia due to other mental disorder: Secondary | ICD-10-CM | POA: Diagnosis not present

## 2022-03-25 ENCOUNTER — Encounter: Payer: Self-pay | Admitting: Internal Medicine

## 2022-03-27 ENCOUNTER — Ambulatory Visit: Payer: Federal, State, Local not specified - PPO | Admitting: Family Medicine

## 2022-03-27 ENCOUNTER — Encounter: Payer: Self-pay | Admitting: Family Medicine

## 2022-03-27 ENCOUNTER — Inpatient Hospital Stay (INDEPENDENT_AMBULATORY_CARE_PROVIDER_SITE_OTHER): Payer: Federal, State, Local not specified - PPO | Admitting: Radiology

## 2022-03-27 VITALS — BP 138/88 | HR 78 | Ht 63.0 in | Wt 169.4 lb

## 2022-03-27 DIAGNOSIS — M1612 Unilateral primary osteoarthritis, left hip: Secondary | ICD-10-CM

## 2022-03-27 DIAGNOSIS — G8929 Other chronic pain: Secondary | ICD-10-CM

## 2022-03-27 DIAGNOSIS — M7918 Myalgia, other site: Secondary | ICD-10-CM

## 2022-03-27 MED ORDER — CYCLOBENZAPRINE HCL 5 MG PO TABS: 5.0000 mg | ORAL_TABLET | Freq: Three times a day (TID) | ORAL | 0 refills | Status: DC | PRN

## 2022-03-27 MED ORDER — TRIAMCINOLONE ACETONIDE 40 MG/ML IJ SUSP
40.0000 mg | Freq: Once | INTRAMUSCULAR | Status: AC
  Administered 2022-03-27: 40 mg via INTRAMUSCULAR

## 2022-03-27 NOTE — Patient Instructions (Addendum)
You have just been given a cortisone injection to reduce pain and inflammation. After the injection you may notice immediate relief of pain as a result of the Lidocaine. It is important to rest the area of the injection for 24 to 48 hours after the injection. There is a possibility of some temporary increased discomfort and swelling for up to 72 hours until the cortisone begins to work. If you do have pain, simply rest the joint and use ice. If you can tolerate over the counter medications, you can try Tylenol for added relief per package instructions. - Continue duloxetine 60 mg daily - Use Flexeril (cyclobenzaprine) up to 3 times a day on as-needed basis, side effect can be drowsiness - Return for follow-up in 3 months, contact for any questions between now and then

## 2022-04-02 NOTE — Progress Notes (Signed)
     Primary Care / Sports Medicine Office Visit  Patient Information:  Patient ID: Barbara Faulkner, female DOB: 07-Feb-1966 Age: 56 y.o. MRN: [id number]   Barbara Faulkner is a pleasant 56 y.o. female presenting with the following:  Chief Complaint  Patient presents with   Primary osteoarthritis of left hip    Vitals:   03/27/22 1014  BP: 138/88  Pulse: 78  SpO2: 98%   Vitals:   03/27/22 1014  Weight: 169 lb 6.4 oz (76.8 kg)  Height: 5\' 3"  (1.6 m)   Body mass index is 30.01 kg/m.  No results found.   Independent interpretation of notes and tests performed by another provider:   None  Procedures performed:   Procedure:  Injection of left hip joint under ultrasound guidance. Ultrasound guidance utilized for in-plane approach to left hip joint, no effusion noted Samsung HS60 device utilized with permanent recording / reporting. Verbal informed consent obtained and verified. Skin prepped in a sterile fashion. Ethyl chloride for topical local analgesia.  Completed without difficulty and tolerated well. Medication: triamcinolone acetonide 40 mg/mL suspension for injection 1 mL total and 2 mL lidocaine 1% without epinephrine utilized for needle placement anesthetic Advised to contact for fevers/chills, erythema, induration, drainage, or persistent bleeding.   Pertinent History, Exam, Impression, and Recommendations:   Problem List Items Addressed This Visit       Musculoskeletal and Integument   Primary osteoarthritis of left hip - Primary    Patient presents for acute on chronic exacerbation of underlying left hip osteoarthritis.  She has been on medication regimen that is multifaceted, despite this she continues to note focal hip pain, examination consistent with osteoarthritis related arthralgia.  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  We discussed the same and from a diagnostic and therapeutic standpoint she did elect to proceed  with ultrasound-guided left hip joint injection with cortisone.  She is to contact to provide a status update in regards to pain and pain change to further ascertain hip joint versus pubic symphysis related symptoms.  I have also advised continuation of Cymbalta at 60 mg and initiation of Flexeril as needed.      Relevant Medications   cyclobenzaprine (FLEXERIL) 5 MG tablet   Other Relevant Orders   [place] LIMITED JOINT SPACE STRUCTURES LOW LEFT     Other   Chronic musculoskeletal pain   Relevant Medications   cyclobenzaprine (FLEXERIL) 5 MG tablet     Orders & Medications Meds ordered this encounter  Medications   cyclobenzaprine (FLEXERIL) 5 MG tablet    Sig: Take 1 tablet (5 mg total) by mouth 3 (three) times daily as needed for muscle spasms.    Dispense:  90 tablet    Refill:  0   triamcinolone acetonide (KENALOG-40) injection 40 mg   Orders Placed This Encounter  Procedures   [place] LIMITED JOINT SPACE STRUCTURES LOW LEFT     Return in about 3 months (around 06/27/2022).     06/29/2022, MD   Primary Care Sports Medicine [facility]

## 2022-04-02 NOTE — Assessment & Plan Note (Addendum)
Patient presents for acute on chronic exacerbation of underlying left hip osteoarthritis.  She has been on medication regimen that is multifaceted, despite this she continues to note focal hip pain, examination consistent with osteoarthritis related arthralgia.  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  We discussed the same and from a diagnostic and therapeutic standpoint she did elect to proceed with ultrasound-guided left hip joint injection with cortisone.  She is to contact us to provide a status update in regards to pain and pain change to further ascertain hip joint versus pubic symphysis related symptoms.  I have also advised continuation of Cymbalta at 60 mg and initiation of Flexeril as needed.

## 2022-04-28 ENCOUNTER — Telehealth: Payer: Self-pay | Admitting: Family Medicine

## 2022-04-28 NOTE — Telephone Encounter (Signed)
Appointment scheduled for 04/29/2022 at 340.

## 2022-04-28 NOTE — Telephone Encounter (Signed)
Copied from CRM [id number]. Topic: Appointment Scheduling - Scheduling Inquiry for Clinic >> Apr 28, 2022 10:35 AM Franchot Heidelberg wrote: Reason for CRM: Pt called requesting a cortisone injection in right heel, requesting a 45 minute appt.   Best contact: [phone]

## 2022-04-29 ENCOUNTER — Encounter
Payer: Federal, State, Local not specified - PPO | Admitting: Student in an Organized Health Care Education/Training Program

## 2022-04-29 ENCOUNTER — Ambulatory Visit: Payer: Federal, State, Local not specified - PPO | Admitting: Family Medicine

## 2022-04-29 ENCOUNTER — Encounter: Payer: Self-pay | Admitting: Family Medicine

## 2022-04-29 ENCOUNTER — Inpatient Hospital Stay (INDEPENDENT_AMBULATORY_CARE_PROVIDER_SITE_OTHER): Payer: Federal, State, Local not specified - PPO | Admitting: Radiology

## 2022-04-29 VITALS — BP 122/86 | HR 92 | Ht 63.0 in | Wt 169.0 lb

## 2022-04-29 DIAGNOSIS — M722 Plantar fascial fibromatosis: Secondary | ICD-10-CM

## 2022-04-29 MED ORDER — TRIAMCINOLONE ACETONIDE 40 MG/ML IJ SUSP
40.0000 mg | Freq: Once | INTRAMUSCULAR | Status: AC
  Administered 2022-04-29: 40 mg via INTRAMUSCULAR

## 2022-04-29 NOTE — Progress Notes (Signed)
     Primary Care / Sports Medicine Office Visit  Patient Information:  Patient ID: Barbara Faulkner, female DOB: 08/21/1966 Age: 56 y.o. MRN: [id number]   Barbara Faulkner is a pleasant 56 y.o. female presenting with the following:  Chief Complaint  Patient presents with   Procedure    Heel injection     Vitals:   04/29/22 1550  BP: 122/86  Pulse: 92  SpO2: 98%   Vitals:   04/29/22 1550  Weight: 169 lb (76.7 kg)  Height: 5\' 3"  (1.6 m)   Body mass index is 29.94 kg/m.  No results found.   Independent interpretation of notes and tests performed by another provider:   None  Procedures performed:   Procedure:  Injection of right plantar fascia under ultrasound guidance. Ultrasound guidance utilized for in-plane approach medially, hypoechoic response from injectate noted Samsung HS60 device utilized with permanent recording / reporting. Verbal informed consent obtained and verified. Skin prepped in a sterile fashion. Ethyl chloride for topical local analgesia.  Completed without difficulty and tolerated well. Medication: triamcinolone acetonide 40 mg/mL suspension for injection 1 mL total and 1 mL lidocaine 1% without epinephrine utilized for needle placement anesthetic Advised to contact for fevers/chills, erythema, induration, drainage, or persistent bleeding.   Pertinent History, Exam, Impression, and Recommendations:   Problem List Items Addressed This Visit       Musculoskeletal and Integument   Plantar fasciitis - Primary    Acute on chronic symptoms, most recent flare over the past several days, no change in activity that coincided with symptom onset. Has trialed ice, rest, stretches, and her current pain medication regimen without response. Pain with first step in morning localizing to medial heel plantar aspect.  Findings are consistent with plantar fasciitis, acute on chronic in nature, she did elect to proceed with ultrasound-guided corticosteroid injection,  post care reviewed, was advised scullcap inserts, relative rest, and follow-up as needed.      Relevant Orders   LIMITED JOINT SPACE STRUCTURES LOW RIGHT     Orders & Medications Meds ordered this encounter  Medications   triamcinolone acetonide (KENALOG-40) injection 40 mg   Orders Placed This Encounter  Procedures   [place] LIMITED JOINT SPACE STRUCTURES LOW RIGHT     No follow-ups on file.     [place], MD   Primary Care Sports Medicine [facility]

## 2022-04-29 NOTE — Patient Instructions (Addendum)
You have just been given a cortisone injection to reduce pain and inflammation. After the injection you may notice immediate relief of pain as a result of the Lidocaine. It is important to rest the area of the injection for 24 to 48 hours after the injection. There is a possibility of some temporary increased discomfort and swelling for up to 72 hours until the cortisone begins to work. If you do have pain, simply rest the joint and use ice. If you can tolerate over the counter medications, you can try Tylenol for added relief per package instructions. - Take relative rest x 2 days, use out-of-work note provided - Continue current medication regimen - We will coordinate TENS / IFC device - Follow-up as-needed

## 2022-04-29 NOTE — Assessment & Plan Note (Signed)
Acute on chronic symptoms, most recent flare over the past several days, no change in activity that coincided with symptom onset. Has trialed ice, rest, stretches, and her current pain medication regimen without response. Pain with first step in morning localizing to medial heel plantar aspect.  Findings are consistent with plantar fasciitis, acute on chronic in nature, she did elect to proceed with ultrasound-guided corticosteroid injection, post care reviewed, was advised scullcap inserts, relative rest, and follow-up as needed.

## 2022-05-09 DIAGNOSIS — F411 Generalized anxiety disorder: Secondary | ICD-10-CM | POA: Diagnosis not present

## 2022-05-09 DIAGNOSIS — F339 Major depressive disorder, recurrent, unspecified: Secondary | ICD-10-CM | POA: Diagnosis not present

## 2022-05-20 ENCOUNTER — Encounter
Payer: Federal, State, Local not specified - PPO | Admitting: Student in an Organized Health Care Education/Training Program

## 2022-05-26 ENCOUNTER — Other Ambulatory Visit
Admission: RE | Admit: 2022-05-26 | Discharge: 2022-05-26 | Disposition: A | Discharge status: Home / Self Care | Payer: Federal, State, Local not specified - PPO | Source: Home / Self Care | Attending: Family | Admitting: Family

## 2022-05-26 ENCOUNTER — Ambulatory Visit: Payer: Federal, State, Local not specified - PPO | Admitting: Family

## 2022-05-26 ENCOUNTER — Ambulatory Visit
Admission: RE | Admit: 2022-05-26 | Discharge: 2022-05-26 | Disposition: A | Discharge status: Home / Self Care | Payer: Federal, State, Local not specified - PPO | Attending: Family | Admitting: Family

## 2022-05-26 ENCOUNTER — Telehealth: Payer: Self-pay | Admitting: Internal Medicine

## 2022-05-26 ENCOUNTER — Encounter: Payer: Self-pay | Admitting: Family

## 2022-05-26 ENCOUNTER — Ambulatory Visit
Admission: RE | Admit: 2022-05-26 | Discharge: 2022-05-26 | Disposition: A | Discharge status: Home / Self Care | Payer: Federal, State, Local not specified - PPO | Source: Ambulatory Visit | Attending: Family | Admitting: Family

## 2022-05-26 VITALS — BP 118/80 | HR 120 | Temp 95.5°F | Ht 63.0 in | Wt 159.6 lb

## 2022-05-26 DIAGNOSIS — R6883 Chills (without fever): Secondary | ICD-10-CM | POA: Diagnosis not present

## 2022-05-26 DIAGNOSIS — R519 Headache, unspecified: Secondary | ICD-10-CM | POA: Diagnosis not present

## 2022-05-26 LAB — URINALYSIS, ROUTINE W REFLEX MICROSCOPIC
Bilirubin Urine: NEGATIVE
Glucose, UA: NEGATIVE mg/dL
Ketones, ur: NEGATIVE mg/dL
Leukocytes,Ua: NEGATIVE
Nitrite: NEGATIVE
Protein, ur: NEGATIVE mg/dL
Specific Gravity, Urine: 1.013 (ref 1.005–1.030)
pH: 5 (ref 5.0–8.0)

## 2022-05-26 LAB — CBC WITH DIFFERENTIAL/PLATELET
Abs Immature Granulocytes: 0.08 10*3/uL — ABNORMAL HIGH (ref 0.00–0.07)
Basophils Absolute: 0 10*3/uL (ref 0.0–0.1)
Basophils Relative: 0 %
Eosinophils Absolute: 0.2 10*3/uL (ref 0.0–0.5)
Eosinophils Relative: 2 %
HCT: 42 % (ref 36.0–46.0)
Hemoglobin: 12.8 g/dL (ref 12.0–15.0)
Immature Granulocytes: 1 %
Lymphocytes Relative: 20 %
Lymphs Abs: 2.6 10*3/uL (ref 0.7–4.0)
MCH: 26 pg (ref 26.0–34.0)
MCHC: 30.5 g/dL (ref 30.0–36.0)
MCV: 85.2 fL (ref 80.0–100.0)
Monocytes Absolute: 1.1 10*3/uL — ABNORMAL HIGH (ref 0.1–1.0)
Monocytes Relative: 8 %
Neutro Abs: 9.1 10*3/uL — ABNORMAL HIGH (ref 1.7–7.7)
Neutrophils Relative %: 69 %
Platelets: 194 10*3/uL (ref 150–400)
RBC: 4.93 MIL/uL (ref 3.87–5.11)
RDW: 13.2 % (ref 11.5–15.5)
WBC: 13.1 10*3/uL — ABNORMAL HIGH (ref 4.0–10.5)
nRBC: 0 % (ref 0.0–0.2)

## 2022-05-26 LAB — COMPREHENSIVE METABOLIC PANEL
ALT: 70 U/L — ABNORMAL HIGH (ref 0–44)
AST: 49 U/L — ABNORMAL HIGH (ref 15–41)
Albumin: 4.3 g/dL (ref 3.5–5.0)
Alkaline Phosphatase: 171 U/L — ABNORMAL HIGH (ref 38–126)
Anion gap: 6 (ref 5–15)
BUN: 17 mg/dL (ref 6–20)
CO2: 26 mmol/L (ref 22–32)
Calcium: 9.3 mg/dL (ref 8.9–10.3)
Chloride: 112 mmol/L — ABNORMAL HIGH (ref 98–111)
Creatinine, Ser: 1.27 mg/dL — ABNORMAL HIGH (ref 0.44–1.00)
GFR, Estimated: 51 mL/min — ABNORMAL LOW (ref >60)
Glucose, Bld: 89 mg/dL (ref 70–99)
Potassium: 4.2 mmol/L (ref 3.5–5.1)
Sodium: 144 mmol/L (ref 135–145)
Total Bilirubin: 0.8 mg/dL (ref 0.3–1.2)
Total Protein: 7.3 g/dL (ref 6.5–8.1)

## 2022-05-26 LAB — POC COVID19 BINAXNOW: SARS Coronavirus 2 Ag: NEGATIVE

## 2022-05-26 LAB — POCT INFLUENZA A/B
Influenza A, POC: NEGATIVE
Influenza B, POC: NEGATIVE

## 2022-05-26 NOTE — Telephone Encounter (Signed)
error 

## 2022-05-26 NOTE — Progress Notes (Signed)
Subjective:    Patient ID: Barbara Faulkner, female    DOB: [birth year], 56 y.o.   MRN: [id number]  CC: Barbara Faulkner is a 56 y.o. female who presents today for an acute visit.    HPI: Complains of front HA x 4 days, unchanged.  She gets hot and cold with improvement.  No h/o HA.    Endorses chills, headache, fatigue, hoarseness   No cough, sob, leg swelling, CP, neck stiffness, sinus congestion,  vision changes, fever, sore throat, diarrhea, dysuria , abdominal pain, constipation,   No h/o HA  She had pink eye last week.   She is drinking lots of water.   She traveled to [place] last week and returned home 1 week ago.  H/o ckd. She avoid NSAIDs.   No recent surgery. No h/o prosthetic joint.        History of tonsillectomy History of allergies, hypertension, psoriatic arthritis Chronic kidney disease-follows with Dr. Suezanne Jacquet HISTORY:  Past Medical History:  Diagnosis Date   Allergy    Anxiety    Chronic kidney disease    Chronic pain    Chronic sinusitis    COVID-19    09/14/20, 06/13/21   Depression    Heart murmur    Hiatal hernia    HPV (human papilloma virus) anogenital infection    Macromastia    Migraine    Psoriatic arthritis (HCC)    Shingles    UTI (urinary tract infection)    ecoli 09/2021   Past Surgical History:  Procedure Laterality Date   BREAST BIOPSY Right 01/28/2018   US guided biopsy - heart shaped   BREAST REDUCTION SURGERY Bilateral 04/03/2020   Procedure: MAMMARY REDUCTION  (BREAST);  Surgeon: Contogiannis, Chales Abrahams, MD;  Location: [facility];  Service: Plastics;  Laterality: Bilateral;  HAVE LIPOSUCTION MACHINE AVAILABLE   CHOLECYSTECTOMY  2009   COLONOSCOPY WITH PROPOFOL N/A 07/19/2018   Procedure: COLONOSCOPY WITH PROPOFOL;  Surgeon: Toney Reil, MD;  Location: [facility] ENDOSCOPY;  Service: Gastroenterology;  Laterality: N/A;   COLONOSCOPY WITH PROPOFOL N/A 09/17/2021   Procedure: COLONOSCOPY WITH PROPOFOL;   Surgeon: Toney Reil, MD;  Location: [facility] ENDOSCOPY;  Service: Gastroenterology;  Laterality: N/A;   COLONOSCOPY WITH PROPOFOL N/A 09/18/2021   Procedure: COLONOSCOPY WITH PROPOFOL;  Surgeon: Toney Reil, MD;  Location: [facility] ENDOSCOPY;  Service: Gastroenterology;  Laterality: N/A;   ESOPHAGOGASTRODUODENOSCOPY (EGD) WITH PROPOFOL N/A 07/19/2018   Procedure: ESOPHAGOGASTRODUODENOSCOPY (EGD) WITH PROPOFOL;  Surgeon: Toney Reil, MD;  Location: [facility] ENDOSCOPY;  Service: Gastroenterology;  Laterality: N/A;   GASTRIC BYPASS     2015; duodenal switch    LAPAROSCOPIC GASTRIC BANDING  2008   removed 2009   REDUCTION MAMMAPLASTY     TUBAL LIGATION  1997   Family History  Problem Relation Age of Onset   Hypertension Mother    Hyperlipidemia Mother    Stroke Mother        age 57   HIV Father    Lung cancer Maternal Grandfather        smoker   Hepatitis Maternal Uncle        drug use   Colon cancer Neg Hx    Colon polyps Neg Hx    Rectal cancer Neg Hx    Stomach cancer Neg Hx    Breast cancer Neg Hx     Allergies: Shellfish allergy and Nsaids Current Outpatient Medications on File Prior to Visit  Medication Sig Dispense Refill  calcitRIOL (ROCALTROL) 0.25 MCG capsule Take 0.25 mcg by mouth daily.     Cholecalciferol 1.25 MG (50000 UT) capsule Take 1 capsule (50,000 Units total) by mouth once a week. D3 13 capsule 1   cyclobenzaprine (FLEXERIL) 5 MG tablet Take 1 tablet (5 mg total) by mouth 3 (three) times daily as needed for muscle spasms. 90 tablet 0   DULoxetine (CYMBALTA) 60 MG capsule Take 1 capsule (60 mg total) by mouth daily. 90 capsule 3   gabapentin (NEURONTIN) 400 MG capsule Take 1 capsule (400 mg total) by mouth at bedtime. 90 capsule 2   lamoTRIgine (LAMICTAL) 150 MG tablet Take 150 mg by mouth daily.     QUEtiapine (SEROQUEL) 100 MG tablet Take 100 mg by mouth at bedtime.     VITAMIN D PO Take by mouth daily at 12 noon.     Current  Facility-Administered Medications on File Prior to Visit  Medication Dose Route Frequency Provider Last Rate Last Admin   lidocaine (PF) (XYLOCAINE) 1 % injection 2 mL  2 mL Intradermal Once Montel Culver, MD        Social History   Tobacco Use   Smoking status: Former    Packs/day: 2.00    Years: 20.00    Total pack years: 40.00    Types: Cigarettes    Quit date: 09/02/2003    Years since quitting: 18.7   Smokeless tobacco: Never  Vaping Use   Vaping Use: Never used  Substance Use Topics   Alcohol use: Not Currently   Drug use: Never    Review of Systems  Constitutional:  Negative for chills and fever.  HENT:  Positive for voice change.   Eyes:  Negative for visual disturbance.  Respiratory:  Negative for cough and shortness of breath.   Cardiovascular:  Negative for chest pain and palpitations.  Gastrointestinal:  Negative for nausea and vomiting.  Neurological:  Positive for headaches.      Objective:    BP 118/80 (BP Location: Left Arm, Patient Position: Sitting, Cuff Size: Large)   Pulse (!) 120   Temp (!) 95.5 F (35.3 C) (Oral)   Ht 5\' 3"  (1.6 m)   Wt 159 lb 9.6 oz (72.4 kg)   LMP 04/02/2014   SpO2 96%   BMI 28.27 kg/m    Physical Exam Vitals reviewed.  Constitutional:      Appearance: Normal appearance. She is well-developed.  HENT:     Head: Normocephalic and atraumatic.     Right Ear: Hearing, tympanic membrane, ear canal and external ear normal. No decreased hearing noted. No drainage, swelling or tenderness. No middle ear effusion. No foreign body. Tympanic membrane is not erythematous or bulging.     Left Ear: Hearing, tympanic membrane, ear canal and external ear normal. No decreased hearing noted. No drainage, swelling or tenderness.  No middle ear effusion. No foreign body. Tympanic membrane is not erythematous or bulging.     Nose: Nose normal. No rhinorrhea.     Right Sinus: No maxillary sinus tenderness or frontal sinus tenderness.      Left Sinus: No maxillary sinus tenderness or frontal sinus tenderness.     Mouth/Throat:     Pharynx: Uvula midline. No oropharyngeal exudate or posterior oropharyngeal erythema.     Tonsils: No tonsillar abscesses.  Eyes:     Conjunctiva/sclera: Conjunctivae normal.     Pupils: Pupils are equal, round, and reactive to light.     Comments: Fundus normal bilaterally.  Cardiovascular:     Rate and Rhythm: Normal rate and regular rhythm.     Pulses: Normal pulses.     Heart sounds: Normal heart sounds.  Pulmonary:     Effort: Pulmonary effort is normal.     Breath sounds: Normal breath sounds. No wheezing, rhonchi or rales.  Abdominal:     General: Bowel sounds are normal. There is no distension.     Palpations: Abdomen is soft. Abdomen is not rigid. There is no fluid wave or mass.     Tenderness: There is abdominal tenderness in the right lower quadrant. There is no guarding or rebound.       Comments: Patient had some tenderness with deep palpation over the right lower quadrant.  Reexamined patient after 15 minutes without pain.  Patient able to lift bilateral legs off of table without pain.  Heel jar test negative.  Musculoskeletal:     Right lower leg: No edema.     Left lower leg: No edema.  Lymphadenopathy:     Head:     Right side of head: No submental, submandibular, tonsillar, preauricular, posterior auricular or occipital adenopathy.     Left side of head: No submental, submandibular, tonsillar, preauricular, posterior auricular or occipital adenopathy.     Cervical: No cervical adenopathy.  Skin:    General: Skin is warm and dry.  Neurological:     Mental Status: She is alert.     Cranial Nerves: No cranial nerve deficit.     Sensory: No sensory deficit.     Deep Tendon Reflexes:     Reflex Scores:      Bicep reflexes are 2+ on the right side and 2+ on the left side.      Patellar reflexes are 2+ on the right side and 2+ on the left side.    Comments: Grip equal and  strong bilateral upper extremities. Gait strong and steady. Able to perform rapid alternating movement without difficulty. Chin to chest without pain.   Psychiatric:        Speech: Speech normal.        Behavior: Behavior normal.        Thought Content: Thought content normal.        Assessment & Plan:   Problem List Items Addressed This Visit       Other   Chills    Tachycardia. HR came down to 115.  EKG shows sinus tachycardia.  No acute changes when compared to previous EKG 11/02/2020.  Diaphoretic at times in exam room. I provider her with water and a blanket.  Reassuring neurologic exam.  Walking SaO2 remained 98%.  She was not labored in her speech as we walked up and down the hall today.  She does have hoarseness,  no cough or congestion.Patient did have mild tenderness with the palpation of her abdomen right lower quadrant.  I reevaluated her abdomen again without any evidence of pain.  Patient does not feel that she is having ongoing abdominal pain and didn't feel evaluation for abdominal etiologies warranted at this time.  Pending urine, chest x-ray, blood cultures to ensure no bacteremia.  Advised patient.  to stay vigilant as a relates to her symptoms.  I will see her in follow-up in 2 days time.  She will call me if she is any new concerns or symptoms worsen in anyway.  Advised she may take Tylenol for headache . EKG and case reviewed with supervising, Dr Duncan Dull, and she and  I jointly agreed on management plan.        Other Visit Diagnoses     Chills (without fever)    -  Primary   Relevant Orders   POC COVID-19 (Completed)   POCT Influenza A/B (Completed)   CBC with Differential/Platelet (Completed)   Comprehensive metabolic panel (Completed)   Urinalysis, Routine w reflex microscopic (Completed)   Urine Culture   DG Chest 2 View   EKG 12-Lead (Completed)   Blood culture (routine single)   Acute nonintractable headache, unspecified headache type       Relevant  Orders   POC COVID-19 (Completed)   POCT Influenza A/B (Completed)         I am having [name] maintain her QUEtiapine, calcitRIOL, VITAMIN D PO, Cholecalciferol, lamoTRIgine, gabapentin, DULoxetine, and cyclobenzaprine. We will continue to administer lidocaine (PF).   No orders of the defined types were placed in this encounter.   Return precautions given.   Risks, benefits, and alternatives of the medications and treatment plan prescribed today were discussed, and patient expressed understanding.   Education regarding symptom management and diagnosis given to patient on AVS.  Continue to follow with McLean-Scocuzza, Pasty Spillers, MD for routine health maintenance.   [name] and I agreed with plan.   Rennie Plowman, FNP

## 2022-05-26 NOTE — Assessment & Plan Note (Addendum)
Tachycardia. HR came down to 115.  EKG shows sinus tachycardia.  No acute changes when compared to previous EKG 11/02/2020.  Diaphoretic at times in exam room. I provider her with water and a blanket.  Reassuring neurologic exam.  Walking SaO2 remained 98%.  She was not labored in her speech as we walked up and down the hall today.  She does have hoarseness,  no cough or congestion.Patient did have mild tenderness with the palpation of her abdomen right lower quadrant.  I reevaluated her abdomen again without any evidence of pain.  Patient does not feel that she is having ongoing abdominal pain and didn't feel evaluation for abdominal etiologies warranted at this time.  Pending urine, chest x-ray, blood cultures to ensure no bacteremia.  Advised patient.  to stay vigilant as a relates to her symptoms.  I will see her in follow-up in 2 days time.  She will call me if she is any new concerns or symptoms worsen in anyway.  Advised she may take Tylenol for headache . EKG and case reviewed with supervising, Dr Deborra Medina, and she and I jointly agreed on management plan.

## 2022-05-26 NOTE — Patient Instructions (Addendum)
Please go directly to medical mall to have stat chest x-ray and labs performed.  I have also ordered urine, blood cultures.       Please stay adequately hydrated.  You may take Tylenol for headache.      . Please let me know how you are doing and most certainly if any symptoms were to change.  I plan to stay very close touch.

## 2022-05-27 DIAGNOSIS — F339 Major depressive disorder, recurrent, unspecified: Secondary | ICD-10-CM | POA: Diagnosis not present

## 2022-05-27 DIAGNOSIS — F411 Generalized anxiety disorder: Secondary | ICD-10-CM | POA: Diagnosis not present

## 2022-05-27 LAB — URINE CULTURE: Culture: NO GROWTH

## 2022-05-28 ENCOUNTER — Ambulatory Visit: Payer: Federal, State, Local not specified - PPO | Admitting: Family

## 2022-05-28 ENCOUNTER — Encounter: Payer: Self-pay | Admitting: Family

## 2022-05-28 VITALS — BP 124/70 | HR 109 | Temp 98.1°F | Ht 63.0 in | Wt 162.4 lb

## 2022-05-28 DIAGNOSIS — K760 Fatty (change of) liver, not elsewhere classified: Secondary | ICD-10-CM

## 2022-05-28 DIAGNOSIS — R6883 Chills (without fever): Secondary | ICD-10-CM | POA: Diagnosis not present

## 2022-05-28 DIAGNOSIS — R748 Abnormal levels of other serum enzymes: Secondary | ICD-10-CM | POA: Diagnosis not present

## 2022-05-28 NOTE — Assessment & Plan Note (Addendum)
Fortunately patient is feeling much better.  She is well-appearing today without diaphoresis or chills.  Reviewed labs, chest x-ray with her today.  All very reassuring.  She will certainly stay vigilant and let me know of any acute concerns or recurrence of symptoms.  Repeat CBC with differential due to leukocytosis, left shift.

## 2022-05-28 NOTE — Assessment & Plan Note (Signed)
Chronic, overall stable.Colonoscopy up-to-date 09/18/2021 with Dr. Marius Ditch Upon chart review, liver biopsy 08/24/2019; pathology shows minimal steatosis , negative for fibrosis Consult with Dr. Allen Norris 08/10/2019 regarding elevated liver enzyme Alk phos 171 ( previous 299 to 206) .  Reviewed Dr. Dorothey Baseman previous note.  At this time we have opted to monitor hepatic enzymes.

## 2022-05-28 NOTE — Progress Notes (Signed)
Subjective:    Patient ID: Barbara Faulkner, female    DOB: [birth year], 56 y.o.   MRN: [id number]  CC: Barbara Faulkner is a 56 y.o. female who presents today for follow up.   HPI: Follow-up from 2 days ago chills without fever,  Headache  She feels much better today.  Denies fever and chills have markedly improved.  She may feel slightly cold however no further episodes of diaphoresis.  No chest pain or cough.    Leukocytosis (13.1) with left shift Crt increased 1.27 ( prior 1.07) Hepatic enzymes at baseline.  Urine studies with no growth, blood, WBC.  Blood culture at this time no growth in 2 days Chest x-ray shows no pleural effusion.  Lungs are clear.  No acute findings.  Moderate stool and gas in the upper colon  Chronic kidney disease-follows with nephrology with follow up 06/25/22   Colonoscopy up-to-date 09/18/2021 with Dr. Marius Ditch Upon chart review, liver biopsy 08/24/2019; pathology shows minimal steatosis , negative for fibrosis Consult with Dr. Allen Norris 08/10/2019 regarding elevated liver enzyme Alk phos 171 ( previous 299 to 206)  HISTORY:  Past Medical History:  Diagnosis Date   Allergy    Anxiety    Chronic kidney disease    Chronic pain    Chronic sinusitis    COVID-19    09/14/20, 06/13/21   Depression    Heart murmur    Hiatal hernia    HPV (human papilloma virus) anogenital infection    Macromastia    Migraine    Psoriatic arthritis ([place])    Shingles    UTI (urinary tract infection)    ecoli 09/2021   Past Surgical History:  Procedure Laterality Date   BREAST BIOPSY Right 01/28/2018   US guided biopsy - heart shaped   BREAST REDUCTION SURGERY Bilateral 04/03/2020   Procedure: MAMMARY REDUCTION  (BREAST);  Surgeon: Contogiannis, Audrea Muscat, MD;  Location: [place];  Service: Plastics;  Laterality: Bilateral;  HAVE LIPOSUCTION MACHINE AVAILABLE   CHOLECYSTECTOMY  2009   COLONOSCOPY WITH PROPOFOL N/A 07/19/2018   Procedure: COLONOSCOPY WITH  PROPOFOL;  Surgeon: Lin Landsman, MD;  Location: [facility] ENDOSCOPY;  Service: Gastroenterology;  Laterality: N/A;   COLONOSCOPY WITH PROPOFOL N/A 09/17/2021   Procedure: COLONOSCOPY WITH PROPOFOL;  Surgeon: Lin Landsman, MD;  Location: [facility] ENDOSCOPY;  Service: Gastroenterology;  Laterality: N/A;   COLONOSCOPY WITH PROPOFOL N/A 09/18/2021   Procedure: COLONOSCOPY WITH PROPOFOL;  Surgeon: Lin Landsman, MD;  Location: [facility] ENDOSCOPY;  Service: Gastroenterology;  Laterality: N/A;   ESOPHAGOGASTRODUODENOSCOPY (EGD) WITH PROPOFOL N/A 07/19/2018   Procedure: ESOPHAGOGASTRODUODENOSCOPY (EGD) WITH PROPOFOL;  Surgeon: Lin Landsman, MD;  Location: [facility] ENDOSCOPY;  Service: Gastroenterology;  Laterality: N/A;   GASTRIC BYPASS     2015; duodenal switch    LAPAROSCOPIC GASTRIC BANDING  2008   removed 2009   REDUCTION MAMMAPLASTY     TUBAL LIGATION  1997   Family History  Problem Relation Age of Onset   Hypertension Mother    Hyperlipidemia Mother    Stroke Mother        age 81   HIV Father    Lung cancer Maternal Grandfather        smoker   Hepatitis Maternal Uncle        drug use   Colon cancer Neg Hx    Colon polyps Neg Hx    Rectal cancer Neg Hx    Stomach cancer Neg Hx  Breast cancer Neg Hx     Allergies: Shellfish allergy and Nsaids Current Outpatient Medications on File Prior to Visit  Medication Sig Dispense Refill   calcitRIOL (ROCALTROL) 0.25 MCG capsule Take 0.25 mcg by mouth daily.     Cholecalciferol 1.25 MG (50000 UT) capsule Take 1 capsule (50,000 Units total) by mouth once a week. D3 13 capsule 1   cyclobenzaprine (FLEXERIL) 5 MG tablet Take 1 tablet (5 mg total) by mouth 3 (three) times daily as needed for muscle spasms. 90 tablet 0   DULoxetine (CYMBALTA) 60 MG capsule Take 1 capsule (60 mg total) by mouth daily. 90 capsule 3   gabapentin (NEURONTIN) 400 MG capsule Take 1 capsule (400 mg total) by mouth at bedtime. 90 capsule 2   lamoTRIgine  (LAMICTAL) 150 MG tablet Take 150 mg by mouth daily.     QUEtiapine (SEROQUEL) 100 MG tablet Take 100 mg by mouth at bedtime.     VITAMIN D PO Take by mouth daily at 12 noon.     Current Facility-Administered Medications on File Prior to Visit  Medication Dose Route Frequency Provider Last Rate Last Admin   lidocaine (PF) (XYLOCAINE) 1 % injection 2 mL  2 mL Intradermal Once Montel Culver, MD        Social History   Tobacco Use   Smoking status: Former    Packs/day: 2.00    Years: 20.00    Total pack years: 40.00    Types: Cigarettes    Quit date: 09/02/2003    Years since quitting: 18.7   Smokeless tobacco: Never  Vaping Use   Vaping Use: Never used  Substance Use Topics   Alcohol use: Not Currently   Drug use: Never    Review of Systems  Constitutional:  Negative for chills and fever.  Respiratory:  Negative for cough.   Cardiovascular:  Negative for chest pain and palpitations.  Gastrointestinal:  Negative for nausea and vomiting.      Objective:    BP 124/70 (BP Location: Left Arm, Patient Position: Sitting, Cuff Size: Normal)   Pulse (!) 109   Temp 98.1 F (36.7 C) (Oral)   Ht '5\' 3"'  (1.6 m)   Wt 162 lb 6.4 oz (73.7 kg)   LMP 04/02/2014   SpO2 96%   BMI 28.77 kg/m  BP Readings from Last 3 Encounters:  05/28/22 124/70  05/26/22 118/80  04/29/22 122/86   Wt Readings from Last 3 Encounters:  05/28/22 162 lb 6.4 oz (73.7 kg)  05/26/22 159 lb 9.6 oz (72.4 kg)  04/29/22 169 lb (76.7 kg)    Physical Exam Vitals reviewed.  Constitutional:      Appearance: She is well-developed.  Eyes:     Conjunctiva/sclera: Conjunctivae normal.  Cardiovascular:     Rate and Rhythm: Normal rate and regular rhythm.     Pulses: Normal pulses.     Heart sounds: Normal heart sounds.  Pulmonary:     Effort: Pulmonary effort is normal.     Breath sounds: Normal breath sounds. No wheezing, rhonchi or rales.  Skin:    General: Skin is warm and dry.  Neurological:      Mental Status: She is alert.  Psychiatric:        Speech: Speech normal.        Behavior: Behavior normal.        Thought Content: Thought content normal.        Assessment & Plan:   Problem List Items Addressed This  Visit       Digestive   RESOLVED: Hepatic steatosis     Other   Chills - Primary    Fortunately patient is feeling much better.  She is well-appearing today without diaphoresis or chills.  Reviewed labs, chest x-ray with her today.  All very reassuring.  She will certainly stay vigilant and let me know of any acute concerns or recurrence of symptoms.  Repeat CBC with differential due to leukocytosis, left shift.      Relevant Orders   Alkaline phosphatase, isoenzymes   Gamma GT   CBC with Differential/Platelet   Comprehensive metabolic panel   Hemoglobin A1c   Lipid panel   TSH   VITAMIN D 25 Hydroxy (Vit-D Deficiency, Fractures)   Elevated alkaline phosphatase level    Alk phos 171 ( previous 299 to 206) .Discussed pursuing bone density as when previously fractionated out appear to be more of bone etiology.  She would like to have bone density arranged when she has her mammogram 10/2022.Patient is history of duodenal switch.  Pending repeat fractionate out alk phos, GGT      Elevated liver enzymes    Chronic, overall stable.Colonoscopy up-to-date 09/18/2021 with Dr. Marius Ditch Upon chart review, liver biopsy 08/24/2019; pathology shows minimal steatosis , negative for fibrosis Consult with Dr. Allen Norris 08/10/2019 regarding elevated liver enzyme Alk phos 171 ( previous 299 to 206) .  Reviewed Dr. Dorothey Baseman previous note.  At this time we have opted to monitor hepatic enzymes.         I am having Cleaster Corin maintain her QUEtiapine, calcitRIOL, VITAMIN D PO, Cholecalciferol, lamoTRIgine, gabapentin, DULoxetine, and cyclobenzaprine. We will continue to administer lidocaine (PF).   No orders of the defined types were placed in this encounter.   Return precautions  given.   Risks, benefits, and alternatives of the medications and treatment plan prescribed today were discussed, and patient expressed understanding.   Education regarding symptom management and diagnosis given to patient on AVS.  Continue to follow with Burnard Hawthorne, FNP for routine health maintenance.   Cleaster Corin and I agreed with plan.   [name], FNP  I have spent 25 minutes with a patient including precharting, exam, reviewing medical records as it relates to elevated liver enzymes, alkaline phosphatase, and discussion plan of care.

## 2022-05-28 NOTE — Assessment & Plan Note (Signed)
Alk phos 171 ( previous 299 to 206) .Discussed pursuing bone density as when previously fractionated out appear to be more of bone etiology.  She would like to have bone density arranged when she has her mammogram 10/2022.Patient is history of duodenal switch.  Pending repeat fractionate out alk phos, GGT

## 2022-05-29 ENCOUNTER — Encounter: Payer: Self-pay | Admitting: Family

## 2022-05-31 LAB — CULTURE, BLOOD (SINGLE)
Culture: NO GROWTH
Special Requests: ADEQUATE

## 2022-06-03 ENCOUNTER — Other Ambulatory Visit: Payer: Self-pay | Admitting: Student in an Organized Health Care Education/Training Program

## 2022-06-03 DIAGNOSIS — G894 Chronic pain syndrome: Secondary | ICD-10-CM

## 2022-06-10 ENCOUNTER — Encounter: Payer: Federal, State, Local not specified - PPO | Admitting: Family Medicine

## 2022-06-10 ENCOUNTER — Encounter: Payer: Self-pay | Admitting: Family Medicine

## 2022-06-10 ENCOUNTER — Other Ambulatory Visit: Payer: Self-pay | Admitting: Family Medicine

## 2022-06-10 DIAGNOSIS — F411 Generalized anxiety disorder: Secondary | ICD-10-CM | POA: Diagnosis not present

## 2022-06-10 DIAGNOSIS — F339 Major depressive disorder, recurrent, unspecified: Secondary | ICD-10-CM | POA: Diagnosis not present

## 2022-06-10 DIAGNOSIS — G8929 Other chronic pain: Secondary | ICD-10-CM

## 2022-06-10 DIAGNOSIS — M25552 Pain in left hip: Secondary | ICD-10-CM

## 2022-06-10 DIAGNOSIS — G5702 Lesion of sciatic nerve, left lower limb: Secondary | ICD-10-CM

## 2022-06-10 DIAGNOSIS — M1612 Unilateral primary osteoarthritis, left hip: Secondary | ICD-10-CM

## 2022-06-10 MED ORDER — DEXAMETHASONE 4 MG PO TABS: ORAL_TABLET | ORAL | 0 refills | Status: DC

## 2022-06-11 ENCOUNTER — Encounter: Payer: Self-pay | Admitting: Family Medicine

## 2022-06-11 ENCOUNTER — Inpatient Hospital Stay (INDEPENDENT_AMBULATORY_CARE_PROVIDER_SITE_OTHER): Payer: Federal, State, Local not specified - PPO | Admitting: Radiology

## 2022-06-11 ENCOUNTER — Ambulatory Visit (INDEPENDENT_AMBULATORY_CARE_PROVIDER_SITE_OTHER): Payer: Federal, State, Local not specified - PPO | Admitting: Family Medicine

## 2022-06-11 ENCOUNTER — Other Ambulatory Visit (INDEPENDENT_AMBULATORY_CARE_PROVIDER_SITE_OTHER): Payer: Federal, State, Local not specified - PPO

## 2022-06-11 VITALS — BP 126/86 | HR 78 | Ht 63.0 in | Wt 163.3 lb

## 2022-06-11 DIAGNOSIS — R6883 Chills (without fever): Secondary | ICD-10-CM | POA: Diagnosis not present

## 2022-06-11 DIAGNOSIS — M25552 Pain in left hip: Secondary | ICD-10-CM

## 2022-06-11 DIAGNOSIS — G8929 Other chronic pain: Secondary | ICD-10-CM | POA: Diagnosis not present

## 2022-06-11 DIAGNOSIS — M533 Sacrococcygeal disorders, not elsewhere classified: Secondary | ICD-10-CM

## 2022-06-11 DIAGNOSIS — M1612 Unilateral primary osteoarthritis, left hip: Secondary | ICD-10-CM

## 2022-06-11 LAB — COMPREHENSIVE METABOLIC PANEL
ALT: 90 U/L — ABNORMAL HIGH (ref 0–35)
AST: 85 U/L — ABNORMAL HIGH (ref 0–37)
Albumin: 4 g/dL (ref 3.5–5.2)
Alkaline Phosphatase: 150 U/L — ABNORMAL HIGH (ref 39–117)
BUN: 18 mg/dL (ref 6–23)
CO2: 25 mEq/L (ref 19–32)
Calcium: 8.7 mg/dL (ref 8.4–10.5)
Chloride: 110 mEq/L (ref 96–112)
Creatinine, Ser: 1.1 mg/dL (ref 0.40–1.20)
GFR: 57.17 mL/min — ABNORMAL LOW (ref >60.00)
Glucose, Bld: 96 mg/dL (ref 70–99)
Potassium: 4.3 mEq/L (ref 3.5–5.1)
Sodium: 142 mEq/L (ref 135–145)
Total Bilirubin: 0.9 mg/dL (ref 0.2–1.2)
Total Protein: 6.1 g/dL (ref 6.0–8.3)

## 2022-06-11 LAB — LIPID PANEL
Cholesterol: 122 mg/dL (ref 0–200)
HDL: 38.2 mg/dL — ABNORMAL LOW (ref >39.00)
LDL Cholesterol: 77 mg/dL (ref 0–99)
NonHDL: 83.91
Total CHOL/HDL Ratio: 3
Triglycerides: 37 mg/dL (ref 0.0–149.0)
VLDL: 7.4 mg/dL (ref 0.0–40.0)

## 2022-06-11 LAB — CBC WITH DIFFERENTIAL/PLATELET
Basophils Absolute: 0 10*3/uL (ref 0.0–0.1)
Basophils Relative: 0.2 % (ref 0.0–3.0)
Eosinophils Absolute: 0.1 10*3/uL (ref 0.0–0.7)
Eosinophils Relative: 1.5 % (ref 0.0–5.0)
HCT: 34.8 % — ABNORMAL LOW (ref 36.0–46.0)
Hemoglobin: 10.9 g/dL — ABNORMAL LOW (ref 12.0–15.0)
Lymphocytes Relative: 20 % (ref 12.0–46.0)
Lymphs Abs: 1.9 10*3/uL (ref 0.7–4.0)
MCHC: 31.2 g/dL (ref 30.0–36.0)
MCV: 86.1 fl (ref 78.0–100.0)
Monocytes Absolute: 0.4 10*3/uL (ref 0.1–1.0)
Monocytes Relative: 4.7 % (ref 3.0–12.0)
Neutro Abs: 7.1 10*3/uL (ref 1.4–7.7)
Neutrophils Relative %: 73.6 % (ref 43.0–77.0)
Platelets: 174 10*3/uL (ref 150.0–400.0)
RBC: 4.05 Mil/uL (ref 3.87–5.11)
RDW: 15.1 % (ref 11.5–15.5)
WBC: 9.6 10*3/uL (ref 4.0–10.5)

## 2022-06-11 LAB — VITAMIN D 25 HYDROXY (VIT D DEFICIENCY, FRACTURES): VITD: 40.34 ng/mL (ref 30.00–100.00)

## 2022-06-11 LAB — GAMMA GT: GGT: 31 U/L (ref 7–51)

## 2022-06-11 LAB — HEMOGLOBIN A1C: Hgb A1c MFr Bld: 4.9 % (ref 4.6–6.5)

## 2022-06-11 LAB — TSH: TSH: 0.68 u[IU]/mL (ref 0.35–5.50)

## 2022-06-11 MED ORDER — TRIAMCINOLONE ACETONIDE 40 MG/ML IJ SUSP: 40.0000 mg | Freq: Once | INTRAMUSCULAR | Status: DC

## 2022-06-11 MED ORDER — TRIAMCINOLONE ACETONIDE 40 MG/ML IJ SUSP
160.0000 mg | Freq: Once | INTRAMUSCULAR | Status: AC
  Administered 2022-06-11: 160 mg via INTRAMUSCULAR

## 2022-06-11 NOTE — Assessment & Plan Note (Signed)
Chronic and recurrent condition in the setting of ipsilateral left hip osteoarthritis, given persistent symptomatology, severity of pain, patient did elect to proceed with ultrasound-guided greater trochanteric hip injection today on the left.  Post care reviewed, continue out Decadron course, follow-up with physical therapy.

## 2022-06-11 NOTE — Progress Notes (Signed)
Primary Care / Sports Medicine Office Visit  Patient Information:  Patient ID: Barbara Faulkner, female DOB: 10/28/1965 Age: 56 y.o. MRN: [id number]   Barbara Faulkner is a pleasant 56 y.o. female presenting with the following:  Chief Complaint  Patient presents with   Primary osteoarthritis of left hip    Vitals:   06/11/22 1526  BP: 126/86  Pulse: 78  SpO2: 98%   Vitals:   06/11/22 1526  Weight: 163 lb 4.8 oz (74.1 kg)  Height: 5\' 3"  (1.6 m)   Body mass index is 28.93 kg/m.     Independent interpretation of notes and tests performed by another provider:   None  Procedures performed:   Procedure:  Injection of left sacroiliac joint under ultrasound guidance. Ultrasound guidance utilized for in-plane approach left SI joint, no overt sonographic abnormalities noted Samsung HS60 device utilized with permanent recording / reporting. Verbal informed consent obtained and verified. Skin prepped in a sterile fashion. Ethyl chloride for topical local analgesia.  Completed without difficulty and tolerated well. Medication: triamcinolone acetonide 40 mg/mL suspension for injection 1 mL total and 2 mL lidocaine 1% without epinephrine utilized for needle placement anesthetic Advised to contact for fevers/chills, erythema, induration, drainage, or persistent bleeding.  Procedure:  Injection of left greater trochanter under ultrasound guidance. Ultrasound guidance utilized for out of plane approach, subtle hypoechoic region possibly consistent with bursitis noted Samsung HS60 device utilized with permanent recording / reporting. Verbal informed consent obtained and verified. Skin prepped in a sterile fashion. Ethyl chloride for topical local analgesia.  Completed without difficulty and tolerated well. Medication: triamcinolone acetonide 40 mg/mL suspension for injection 1 mL total and 2 mL lidocaine 1% without epinephrine utilized for needle placement anesthetic Advised to  contact for fevers/chills, erythema, induration, drainage, or persistent bleeding.  Procedure:  Injection of left femoroacetabular joint under ultrasound guidance. Ultrasound guidance utilized for in-plane approach to the femoral head-neck junction, no effusion noted Samsung HS60 device utilized with permanent recording / reporting. Verbal informed consent obtained and verified. Skin prepped in a sterile fashion. Ethyl chloride for topical local analgesia.  Completed without difficulty and tolerated well. Medication: triamcinolone acetonide 40 mg/mL suspension for injection 1 mL total and 2 mL lidocaine 1% without epinephrine utilized for needle placement anesthetic Advised to contact for fevers/chills, erythema, induration, drainage, or persistent bleeding.   Pertinent History, Exam, Impression, and Recommendations:   Problem List Items Addressed This Visit       Musculoskeletal and Integument   Primary osteoarthritis of left hip - Primary    Barbara Faulkner presents for follow-up to chronic left hip pain the setting of osteoarthritis.  She has had prior intra-articular corticosteroid injections with diminishing response.  She is in severe pain despite current medication regimen.  Given persistent symptomatology, prior response, patient did elect to proceed with ultrasound-guided intra-articular left hip joint injection.  Additionally, referral to orthopedic hip surgeon placed.  Patient understands that in the setting of recent intra-articular corticosteroid injection, surgery would be precluded for typically 3 months.  Additionally, to address periarticular muscular pain, Decadron course discussed.  She is to follow-up with total hip surgeon.      Relevant Orders   [place] LIMITED JOINT SPACE STRUCTURES LOW LEFT   Greater 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    Chronic and recurrent condition in the setting of ipsilateral left hip osteoarthritis, given persistent symptomatology,  severity of pain, patient did elect to proceed with ultrasound-guided greater trochanteric hip  injection today on the left.  Post care reviewed, continue out Decadron course, follow-up with physical therapy.        Other   Chronic left SI joint pain    Chronic sacroiliac joint pain in the setting of left hip osteoarthritis and greater trochanteric pain syndrome.  Acutely symptomatic despite current medication regimen, given prior response patient did elect to proceed with ultrasound-guided SI joint injection.  Post care reviewed, follow-up with PT, referral placed.      Relevant Orders   [place] LIMITED JOINT SPACE STRUCTURES LOW LEFT     Orders & Medications Meds ordered this encounter  Medications   DISCONTD: triamcinolone acetonide (KENALOG-40) injection 40 mg   triamcinolone acetonide (KENALOG-40) injection 160 mg   Orders Placed This Encounter  Procedures   [place] LIMITED JOINT SPACE STRUCTURES LOW LEFT     No follow-ups on file.     [name], MD   Primary Care Sports Medicine [facility]

## 2022-06-11 NOTE — Patient Instructions (Addendum)
You have just been given a cortisone injection to reduce pain and inflammation. After the injection you may notice immediate relief of pain as a result of the Lidocaine. It is important to rest the area of the injection for 24 to 48 hours after the injection. There is a possibility of some temporary increased discomfort and swelling for up to 72 hours until the cortisone begins to work. If you do have pain, simply rest the joint and use ice. If you can tolerate over the counter medications, you can try Tylenol, Aleve, or Advil for added relief per package instructions. - Finish out steroid course - Referral coordinator will contact you to schedule visit with orthopedic surgeon and physical therapist. - Contact for questions and follow-up as needed

## 2022-06-11 NOTE — Assessment & Plan Note (Signed)
Chronic sacroiliac joint pain in the setting of left hip osteoarthritis and greater trochanteric pain syndrome.  Acutely symptomatic despite current medication regimen, given prior response patient did elect to proceed with ultrasound-guided SI joint injection.  Post care reviewed, follow-up with PT, referral placed.

## 2022-06-11 NOTE — Assessment & Plan Note (Addendum)
Barbara Faulkner presents for follow-up to chronic left hip pain the setting of osteoarthritis.  She has had prior intra-articular corticosteroid injections with diminishing response.  She is in severe pain despite current medication regimen.  Given persistent symptomatology, prior response, patient did elect to proceed with ultrasound-guided intra-articular left hip joint injection.  Additionally, referral to orthopedic hip surgeon placed.  Patient understands that in the setting of recent intra-articular corticosteroid injection, surgery would be precluded for typically 3 months.  Additionally, to address periarticular muscular pain, Decadron course discussed.  She is to follow-up with total hip surgeon.

## 2022-06-11 NOTE — Progress Notes (Signed)
Erroneous encounter

## 2022-06-12 ENCOUNTER — Encounter: Payer: Self-pay | Admitting: Family Medicine

## 2022-06-12 DIAGNOSIS — F419 Anxiety disorder, unspecified: Secondary | ICD-10-CM | POA: Diagnosis not present

## 2022-06-12 DIAGNOSIS — F4312 Post-traumatic stress disorder, chronic: Secondary | ICD-10-CM | POA: Diagnosis not present

## 2022-06-12 DIAGNOSIS — F5105 Insomnia due to other mental disorder: Secondary | ICD-10-CM | POA: Diagnosis not present

## 2022-06-12 DIAGNOSIS — F39 Unspecified mood [affective] disorder: Secondary | ICD-10-CM | POA: Diagnosis not present

## 2022-06-13 ENCOUNTER — Telehealth: Payer: Self-pay | Admitting: Family

## 2022-06-13 ENCOUNTER — Encounter: Payer: Self-pay | Admitting: Family

## 2022-06-13 ENCOUNTER — Ambulatory Visit (INDEPENDENT_AMBULATORY_CARE_PROVIDER_SITE_OTHER): Payer: Federal, State, Local not specified - PPO | Admitting: Family

## 2022-06-13 ENCOUNTER — Other Ambulatory Visit (HOSPITAL_COMMUNITY)
Admission: RE | Admit: 2022-06-13 | Discharge: 2022-06-13 | Disposition: A | Discharge status: Home / Self Care | Payer: Federal, State, Local not specified - PPO | Source: Ambulatory Visit | Attending: Family | Admitting: Family

## 2022-06-13 VITALS — BP 128/78 | HR 98 | Temp 98.1°F | Ht 63.0 in | Wt 166.2 lb

## 2022-06-13 DIAGNOSIS — Z23 Encounter for immunization: Secondary | ICD-10-CM | POA: Diagnosis not present

## 2022-06-13 DIAGNOSIS — Z0000 Encounter for general adult medical examination without abnormal findings: Secondary | ICD-10-CM | POA: Diagnosis not present

## 2022-06-13 DIAGNOSIS — F419 Anxiety disorder, unspecified: Secondary | ICD-10-CM | POA: Diagnosis not present

## 2022-06-13 DIAGNOSIS — D649 Anemia, unspecified: Secondary | ICD-10-CM | POA: Diagnosis not present

## 2022-06-13 DIAGNOSIS — F32A Depression, unspecified: Secondary | ICD-10-CM

## 2022-06-13 DIAGNOSIS — K219 Gastro-esophageal reflux disease without esophagitis: Secondary | ICD-10-CM

## 2022-06-13 LAB — IBC + FERRITIN
Ferritin: 15.7 ng/mL (ref 10.0–291.0)
Iron: 56 ug/dL (ref 42–145)
Saturation Ratios: 12.5 % — ABNORMAL LOW (ref 20.0–50.0)
TIBC: 448 ug/dL (ref 250.0–450.0)
Transferrin: 320 mg/dL (ref 212.0–360.0)

## 2022-06-13 LAB — B12 AND FOLATE PANEL
Folate: 6.9 ng/mL (ref >5.9)
Vitamin B-12: 338 pg/mL (ref 211–911)

## 2022-06-13 MED ORDER — PANTOPRAZOLE SODIUM 20 MG PO TBEC: 20.0000 mg | DELAYED_RELEASE_TABLET | Freq: Every day | ORAL | 0 refills | Status: DC

## 2022-06-13 NOTE — Progress Notes (Signed)
Subjective:    Patient ID: Barbara Faulkner, female    DOB: 12-24-1965, 56 y.o.   MRN: [id number]  CC: Barbara Faulkner is a 56 y.o. female who presents today for physical exam.    HPI: Epigastric pain , episodic, for years.  No particular trigger.  Does not occur necessarily with a meal.  No fever, N, constipation , choking, pain with swallowing.  No alcohol. No nsaids.       She tried OTC prilosec no relief.  H/o small hiatal hernia 07/19/2018.   Normocytic anemia-  History of gastric bypass. She has had iron infusions; she had seen Dr Orlie Dakin in the past.  No blood from vagina, rectum.    CKD- follows with Nephrology, Dr Suezanne Jacquet Depression- she is following counseling and also Dr [name]   Colorectal Cancer Screening: UTD, Dr. Allegra Lai 09/18/2021.  Polyps.  Repeat in 3 years. Reports that she exam with Dr Cherly Hensen and not sure if she needs colpopsy.    Breast Cancer Screening: Mammogram UTD Cervical Cancer Screening: due; done 08/24/2018, HPV positive Bone Health screening/DEXA for 65+: No increased fracture risk. Defer screening at this time.  Lung Cancer Screening: Doesn't have 20 year pack year history and age > 75 years yo 79 years         Tetanus - UTD       Labs: Screening labs today.Planning to start walking and PT left hip.    Exercise: No regular exercise.   Alcohol use:  none Smoking/tobacco use: former smoker; quit 2005 , more than 15 years ago    HISTORY:  Past Medical History:  Diagnosis Date   Allergy    Anxiety    Chronic kidney disease    Chronic pain    Chronic sinusitis    COVID-19    09/14/20, 06/13/21   Depression    Heart murmur    Hiatal hernia    HPV (human papilloma virus) anogenital infection    Macromastia    Migraine    Psoriatic arthritis (HCC)    Shingles    UTI (urinary tract infection)    ecoli 09/2021    Past Surgical History:  Procedure Laterality Date   BREAST BIOPSY Right 01/28/2018   US guided biopsy - heart shaped    BREAST REDUCTION SURGERY Bilateral 04/03/2020   Procedure: MAMMARY REDUCTION  (BREAST);  Surgeon: Contogiannis, Chales Abrahams, MD;  Location: [facility];  Service: Plastics;  Laterality: Bilateral;  HAVE LIPOSUCTION MACHINE AVAILABLE   CHOLECYSTECTOMY  2009   COLONOSCOPY WITH PROPOFOL N/A 07/19/2018   Procedure: COLONOSCOPY WITH PROPOFOL;  Surgeon: Toney Reil, MD;  Location: [facility] ENDOSCOPY;  Service: Gastroenterology;  Laterality: N/A;   COLONOSCOPY WITH PROPOFOL N/A 09/17/2021   Procedure: COLONOSCOPY WITH PROPOFOL;  Surgeon: Toney Reil, MD;  Location: [facility] ENDOSCOPY;  Service: Gastroenterology;  Laterality: N/A;   COLONOSCOPY WITH PROPOFOL N/A 09/18/2021   Procedure: COLONOSCOPY WITH PROPOFOL;  Surgeon: Toney Reil, MD;  Location: [facility] ENDOSCOPY;  Service: Gastroenterology;  Laterality: N/A;   ESOPHAGOGASTRODUODENOSCOPY (EGD) WITH PROPOFOL N/A 07/19/2018   Procedure: ESOPHAGOGASTRODUODENOSCOPY (EGD) WITH PROPOFOL;  Surgeon: Toney Reil, MD;  [facility];  Service: Gastroenterology;  Laterality: N/A;   GASTRIC BYPASS     2015; duodenal switch    LAPAROSCOPIC GASTRIC BANDING  2008   removed 2009   REDUCTION MAMMAPLASTY     TUBAL LIGATION  1997   Family History  Problem Relation Age of Onset   Hypertension Mother  Hyperlipidemia Mother    Stroke Mother        age 17   HIV Father    Lung cancer Maternal Grandfather        smoker   Hepatitis Maternal Uncle        drug use   Colon cancer Neg Hx    Colon polyps Neg Hx    Rectal cancer Neg Hx    Stomach cancer Neg Hx    Breast cancer Neg Hx       ALLERGIES: Shellfish allergy and Nsaids  Current Outpatient Medications on File Prior to Visit  Medication Sig Dispense Refill   calcitRIOL (ROCALTROL) 0.25 MCG capsule Take 0.25 mcg by mouth daily.     Cholecalciferol 1.25 MG (50000 UT) capsule Take 1 capsule (50,000 Units total) by mouth once a week. D3 13 capsule 1    cyclobenzaprine (FLEXERIL) 5 MG tablet Take 1 tablet (5 mg total) by mouth 3 (three) times daily as needed for muscle spasms. 90 tablet 0   dexamethasone (DECADRON) 4 MG tablet Take 2 tablets (8 mg) by mouth for one dose, then continue with 1 tablet 4 times per day afterwards for a total course of 5 days. 21 tablet 0   DULoxetine (CYMBALTA) 60 MG capsule Take 1 capsule (60 mg total) by mouth daily. 90 capsule 3   gabapentin (NEURONTIN) 400 MG capsule Take 1 capsule (400 mg total) by mouth at bedtime. 90 capsule 2   lamoTRIgine (LAMICTAL) 150 MG tablet Take 150 mg by mouth daily.     QUEtiapine (SEROQUEL) 100 MG tablet Take 100 mg by mouth at bedtime.     VITAMIN D PO Take by mouth daily at 12 noon.     Current Facility-Administered Medications on File Prior to Visit  Medication Dose Route Frequency Provider Last Rate Last Admin   lidocaine (PF) (XYLOCAINE) 1 % injection 2 mL  2 mL Intradermal Once Montel Culver, MD        Social History   Tobacco Use   Smoking status: Former    Packs/day: 2.00    Years: 20.00    Total pack years: 40.00    Types: Cigarettes    Quit date: 09/02/2003    Years since quitting: 18.7   Smokeless tobacco: Never  Vaping Use   Vaping Use: Never used  Substance Use Topics   Alcohol use: Not Currently   Drug use: Never    Review of Systems  Constitutional:  Negative for chills, fever and unexpected weight change.  HENT:  Negative for congestion.   Respiratory:  Negative for cough.   Cardiovascular:  Negative for chest pain, palpitations and leg swelling.  Gastrointestinal:  Negative for abdominal distention, abdominal pain, diarrhea, nausea and vomiting.  Musculoskeletal:  Negative for arthralgias and myalgias.  Skin:  Negative for rash.  Neurological:  Negative for headaches.  Hematological:  Negative for adenopathy.  Psychiatric/Behavioral:  Negative for confusion.       Objective:    BP 128/78 (BP Location: Left Arm, Patient Position: Sitting,  Cuff Size: Normal)   Pulse 98   Temp 98.1 F (36.7 C) (Oral)   Ht 5\' 3"  (1.6 m)   Wt 166 lb 3.2 oz (75.4 kg)   LMP 04/02/2014   SpO2 97%   BMI 29.44 kg/m   BP Readings from Last 3 Encounters:  06/13/22 128/78  06/11/22 126/86  05/28/22 124/70   Wt Readings from Last 3 Encounters:  06/13/22 166 lb 3.2 oz (75.4 kg)  06/11/22 163 lb 4.8 oz (74.1 kg)  05/28/22 162 lb 6.4 oz (73.7 kg)    Physical Exam Vitals reviewed.  Constitutional:      Appearance: Normal appearance. She is well-developed.  Eyes:     Conjunctiva/sclera: Conjunctivae normal.  Neck:     Thyroid: No thyroid mass or thyromegaly.  Cardiovascular:     Rate and Rhythm: Normal rate and regular rhythm.     Pulses: Normal pulses.     Heart sounds: Normal heart sounds.  Pulmonary:     Effort: Pulmonary effort is normal.     Breath sounds: Normal breath sounds. No wheezing, rhonchi or rales.  Chest:  Breasts:    Breasts are symmetrical.     Right: No inverted nipple, mass, nipple discharge, skin change or tenderness.     Left: No inverted nipple, mass, nipple discharge, skin change or tenderness.  Abdominal:     General: Bowel sounds are normal. There is no distension.     Palpations: Abdomen is soft. Abdomen is not rigid. There is no fluid wave or mass.     Tenderness: There is no abdominal tenderness. There is no guarding or rebound.  Genitourinary:    Cervix: No cervical motion tenderness, discharge or friability.     Uterus: Not enlarged, not fixed and not tender.      Adnexa:        Right: No mass, tenderness or fullness.         Left: No mass, tenderness or fullness.       Comments: Pap performed. No CMT. Unable to appreciated ovaries. Lymphadenopathy:     Head:     Right side of head: No submental, submandibular, tonsillar, preauricular, posterior auricular or occipital adenopathy.     Left side of head: No submental, submandibular, tonsillar, preauricular, posterior auricular or occipital adenopathy.      Cervical:     Right cervical: No superficial, deep or posterior cervical adenopathy.    Left cervical: No superficial, deep or posterior cervical adenopathy.     Upper Body:     Right upper body: No pectoral adenopathy.     Left upper body: No pectoral adenopathy.  Skin:    General: Skin is warm and dry.  Neurological:     Mental Status: She is alert.  Psychiatric:        Speech: Speech normal.        Behavior: Behavior normal.        Thought Content: Thought content normal.        Assessment & Plan:   Problem List Items Addressed This Visit       Digestive   Gastroesophageal reflux disease    Symptoms mostly consistent with GERD.  Trial of Protonix 20 mg every morning and advised her to increase to twice daily dosing if not effective.  Follow-up in 6 weeks.       Relevant Medications   pantoprazole (PROTONIX) 20 MG tablet     Other   Anxiety and depression   Normocytic anemia    History of gastric bypass, previous iron infusions, Dr. Orlie Dakin.  Pending B12 and ferritin.  Patient will trial over-the-counter ferrous sulfate 325 mg daily however likely will end up referring her to hematology as previously required infusions      Relevant Orders   B12 and Folate Panel   IBC + Ferritin   Routine physical examination - Primary    Clinical breast and performed today.  Mammogram and colonoscopy up-to-date.  Requesting records from Dr. Cherly Hensen office in regards to history of Pap with HPV.  I suspect she had a colposcopy in 2021, currently requesting records.        Relevant Orders   Cytology - PAP     I am having Barbara Faulkner start on pantoprazole. I am also having her maintain her QUEtiapine, calcitRIOL, VITAMIN D PO, Cholecalciferol, lamoTRIgine, gabapentin, DULoxetine, cyclobenzaprine, and dexamethasone. We will continue to administer lidocaine (PF).   Meds ordered this encounter  Medications   pantoprazole (PROTONIX) 20 MG tablet    Sig: Take 1 tablet (20 mg  total) by mouth daily. Take 30 minutes or an prior to breakfast    Dispense:  90 tablet    Refill:  0    Order Specific Question:   Supervising Provider    Answer:   Sherlene Shams [[id number]]    Return precautions given.   Risks, benefits, and alternatives of the medications and treatment plan prescribed today were discussed, and patient expressed understanding.   Education regarding symptom management and diagnosis given to patient on AVS.   Continue to follow with Allegra Grana, FNP for routine health maintenance.   Barbara Faulkner and I agreed with plan.   Rennie Plowman, FNP

## 2022-06-13 NOTE — Patient Instructions (Addendum)
You could try over the counter ferrous sulfate 325mg  once daily with meal.   If no relief from once daily dosing of protonix, you may in increase to protonix 20mg  to once before breakfast and once before dinner.   Keep a diary regarding symptoms and foods eaten  Health Maintenance for Postmenopausal Women Menopause is a normal process in which your ability to get pregnant comes to an end. This process happens slowly over many months or years, usually between the ages of 24 and 44. Menopause is complete when you have missed your menstrual period for 12 months. It is important to talk with your health care provider about some of the most common conditions that affect women after menopause (postmenopausal women). These include heart disease, cancer, and bone loss (osteoporosis). Adopting a healthy lifestyle and getting preventive care can help to promote your health and wellness. The actions you take can also lower your chances of developing some of these common conditions. What are the signs and symptoms of menopause? During menopause, you may have the following symptoms: Hot flashes. These can be moderate or severe. Night sweats. Decrease in sex drive. Mood swings. Headaches. Tiredness (fatigue). Irritability. Memory problems. Problems falling asleep or staying asleep. Talk with your health care provider about treatment options for your symptoms. Do I need hormone replacement therapy? Hormone replacement therapy is effective in treating symptoms that are caused by menopause, such as hot flashes and night sweats. Hormone replacement carries certain risks, especially as you become older. If you are thinking about using estrogen or estrogen with progestin, discuss the benefits and risks with your health care provider. How can I reduce my risk for heart disease and stroke? The risk of heart disease, heart attack, and stroke increases as you age. One of the causes may be a change in the body's  hormones during menopause. This can affect how your body uses dietary fats, triglycerides, and cholesterol. Heart attack and stroke are medical emergencies. There are many things that you can do to help prevent heart disease and stroke. Watch your blood pressure High blood pressure causes heart disease and increases the risk of stroke. This is more likely to develop in people who have high blood pressure readings or are overweight. Have your blood pressure checked: Every 3-5 years if you are 41-74 years of age. Every year if you are 81 years old or older. Eat a healthy diet  Eat a diet that includes plenty of vegetables, fruits, low-fat dairy products, and lean protein. Do not eat a lot of foods that are high in solid fats, added sugars, or sodium. Get regular exercise Get regular exercise. This is one of the most important things you can do for your health. Most adults should: Try to exercise for at least 150 minutes each week. The exercise should increase your heart rate and make you sweat (moderate-intensity exercise). Try to do strengthening exercises at least twice each week. Do these in addition to the moderate-intensity exercise. Spend less time sitting. Even light physical activity can be beneficial. Other tips Work with your health care provider to achieve or maintain a healthy weight. Do not use any products that contain nicotine or tobacco. These products include cigarettes, chewing tobacco, and vaping devices, such as e-cigarettes. If you need help quitting, ask your health care provider. Know your numbers. Ask your health care provider to check your cholesterol and your blood sugar (glucose). Continue to have your blood tested as directed by your health care provider. Do  I need screening for cancer? Depending on your health history and family history, you may need to have cancer screenings at different stages of your life. This may include screening for: Breast cancer. Cervical  cancer. Lung cancer. Colorectal cancer. What is my risk for osteoporosis? After menopause, you may be at increased risk for osteoporosis. Osteoporosis is a condition in which bone destruction happens more quickly than new bone creation. To help prevent osteoporosis or the bone fractures that can happen because of osteoporosis, you may take the following actions: If you are 36-10 years old, get at least 1,000 mg of calcium and at least 600 international units (IU) of vitamin D per day. If you are older than age 67 but younger than age 53, get at least 1,200 mg of calcium and at least 600 international units (IU) of vitamin D per day. If you are older than age 102, get at least 1,200 mg of calcium and at least 800 international units (IU) of vitamin D per day. Smoking and drinking excessive alcohol increase the risk of osteoporosis. Eat foods that are rich in calcium and vitamin D, and do weight-bearing exercises several times each week as directed by your health care provider. How does menopause affect my mental health? Depression may occur at any age, but it is more common as you become older. Common symptoms of depression include: Feeling depressed. Changes in sleep patterns. Changes in appetite or eating patterns. Feeling an overall lack of motivation or enjoyment of activities that you previously enjoyed. Frequent crying spells. Talk with your health care provider if you think that you are experiencing any of these symptoms. General instructions See your health care provider for regular wellness exams and vaccines. This may include: Scheduling regular health, dental, and eye exams. Getting and maintaining your vaccines. These include: Influenza vaccine. Get this vaccine each year before the flu season begins. Pneumonia vaccine. Shingles vaccine. Tetanus, diphtheria, and pertussis (Tdap) booster vaccine. Your health care provider may also recommend other immunizations. Tell your health  care provider if you have ever been abused or do not feel safe at home. Summary Menopause is a normal process in which your ability to get pregnant comes to an end. This condition causes hot flashes, night sweats, decreased interest in sex, mood swings, headaches, or lack of sleep. Treatment for this condition may include hormone replacement therapy. Take actions to keep yourself healthy, including exercising regularly, eating a healthy diet, watching your weight, and checking your blood pressure and blood sugar levels. Get screened for cancer and depression. Make sure that you are up to date with all your vaccines. This information is not intended to replace advice given to you by your health care provider. Make sure you discuss any questions you have with your health care provider. Document Revised: 01/07/2021 Document Reviewed: 01/07/2021 Elsevier Patient Education  2023 [employer] Food Choices for Gastroesophageal Reflux Disease, Adult When you have gastroesophageal reflux disease (GERD), the foods you eat and your eating habits are very important. Choosing the right foods can help ease the discomfort of GERD. Consider working with a dietitian to help you make healthy food choices. What are tips for following this plan? Reading food labels Look for foods that are low in saturated fat. Foods that have less than 5% of daily value (DV) of fat and 0 g of trans fats may help with your symptoms. Cooking Cook foods using methods other than frying. This may include baking, steaming, grilling, or broiling. These are all  methods that do not need a lot of fat for cooking. To add flavor, try to use herbs that are low in spice and acidity. Meal planning  Choose healthy foods that are low in fat, such as fruits, vegetables, whole grains, low-fat dairy products, lean meats, fish, and poultry. Eat frequent, small meals instead of three large meals each day. Eat your meals slowly, in a relaxed setting.  Avoid bending over or lying down until 2-3 hours after eating. Limit high-fat foods such as fatty meats or fried foods. Limit your intake of fatty foods, such as oils, butter, and shortening. Avoid the following as told by your health care provider: Foods that cause symptoms. These may be different for different people. Keep a food diary to keep track of foods that cause symptoms. Alcohol. Drinking large amounts of liquid with meals. Eating meals during the 2-3 hours before bed. Lifestyle Maintain a healthy weight. Ask your health care provider what weight is healthy for you. If you need to lose weight, work with your health care provider to do so safely. Exercise for at least 30 minutes on 5 or more days each week, or as told by your health care provider. Avoid wearing clothes that fit tightly around your waist and chest. Do not use any products that contain nicotine or tobacco. These products include cigarettes, chewing tobacco, and vaping devices, such as e-cigarettes. If you need help quitting, ask your health care provider. Sleep with the head of your bed raised. Use a wedge under the mattress or blocks under the bed frame to raise the head of the bed. Chew sugar-free gum after mealtimes. What foods should I eat?  Eat a healthy, well-balanced diet of fruits, vegetables, whole grains, low-fat dairy products, lean meats, fish, and poultry. Each person is different. Foods that may trigger symptoms in one person may not trigger any symptoms in another person. Work with your health care provider to identify foods that are safe for you. The items listed above may not be a complete list of recommended foods and beverages. Contact a dietitian for more information. What foods should I avoid? Limiting some of these foods may help manage the symptoms of GERD. Everyone is different. Consult a dietitian or your health care provider to help you identify the exact foods to avoid, if any. Fruits Any  fruits prepared with added fat. Any fruits that cause symptoms. For some people this may include citrus fruits, such as oranges, grapefruit, pineapple, and lemons. Vegetables Deep-fried vegetables. [place] fries. Any vegetables prepared with added fat. Any vegetables that cause symptoms. For some people, this may include tomatoes and tomato products, chili peppers, onions and garlic, and horseradish. Grains Pastries or quick breads with added fat. Meats and other proteins High-fat meats, such as fatty beef or pork, hot dogs, ribs, ham, sausage, salami, and bacon. Fried meat or protein, including fried fish and fried chicken. Nuts and nut butters, in large amounts. Dairy Whole milk and chocolate milk. Sour cream. Cream. Ice cream. Cream cheese. Milkshakes. Fats and oils Butter. Margarine. Shortening. Ghee. Beverages Coffee and tea, with or without caffeine. Carbonated beverages. Sodas. Energy drinks. Fruit juice made with acidic fruits, such as orange or grapefruit. Tomato juice. Alcoholic drinks. Sweets and desserts Chocolate and cocoa. Donuts. Seasonings and condiments Pepper. Peppermint and spearmint. Added salt. Any condiments, herbs, or seasonings that cause symptoms. For some people, this may include curry, hot sauce, or vinegar-based salad dressings. The items listed above may not be a complete list  of foods and beverages to avoid. Contact a dietitian for more information. Questions to ask your health care provider Diet and lifestyle changes are usually the first steps that are taken to manage symptoms of GERD. If diet and lifestyle changes do not improve your symptoms, talk with your health care provider about taking medicines. Where to find more information International Foundation for Gastrointestinal Disorders: aboutgerd.org Summary When you have gastroesophageal reflux disease (GERD), food and lifestyle choices may be very helpful in easing the discomfort of GERD. Eat frequent,  small meals instead of three large meals each day. Eat your meals slowly, in a relaxed setting. Avoid bending over or lying down until 2-3 hours after eating. Limit high-fat foods such as fatty meats or fried foods. This information is not intended to replace advice given to you by your health care provider. Make sure you discuss any questions you have with your health care provider. Document Revised: 02/27/2020 Document Reviewed: 02/27/2020 Elsevier Patient Education  2023 [employer].

## 2022-06-13 NOTE — Assessment & Plan Note (Signed)
History of gastric bypass, previous iron infusions, Dr. Grayland Ormond.  Pending B12 and ferritin.  Patient will trial over-the-counter ferrous sulfate 325 mg daily however likely will end up referring her to hematology as previously required infusions

## 2022-06-13 NOTE — Assessment & Plan Note (Signed)
Clinical breast and performed today.  Mammogram and colonoscopy up-to-date.  Requesting records from Dr. Garwin Brothers office in regards to history of Pap with HPV.  I suspect she had a colposcopy in 2021, currently requesting records.

## 2022-06-13 NOTE — Telephone Encounter (Signed)
Call dr Alanda Slim cousins, GYN  We need records from 2021 as she had abnormal pap smear

## 2022-06-13 NOTE — Assessment & Plan Note (Signed)
Symptoms mostly consistent with GERD.  Trial of Protonix 20 mg every morning and advised her to increase to twice daily dosing if not effective.  Follow-up in 6 weeks.

## 2022-06-16 ENCOUNTER — Encounter: Payer: Self-pay | Admitting: Family

## 2022-06-16 NOTE — Telephone Encounter (Signed)
Med release faxed to Dr. Garwin Brothers office.

## 2022-06-17 ENCOUNTER — Encounter: Payer: Self-pay | Admitting: Family

## 2022-06-17 ENCOUNTER — Other Ambulatory Visit: Payer: Self-pay | Admitting: Family

## 2022-06-17 DIAGNOSIS — R748 Abnormal levels of other serum enzymes: Secondary | ICD-10-CM

## 2022-06-17 LAB — ALKALINE PHOSPHATASE, ISOENZYMES
Alkaline Phosphatase: 171 IU/L — ABNORMAL HIGH (ref 44–121)
BONE FRACTION: 36 % (ref 14–68)
INTESTINAL FRAC.: 6 % (ref 0–18)
LIVER FRACTION: 58 % (ref 18–85)

## 2022-06-18 LAB — CYTOLOGY - PAP
Adequacy: ABSENT
Comment: NEGATIVE
Diagnosis: NEGATIVE
High risk HPV: NEGATIVE

## 2022-06-25 ENCOUNTER — Ambulatory Visit
Admission: RE | Admit: 2022-06-25 | Discharge: 2022-06-25 | Disposition: A | Discharge status: Home / Self Care | Payer: Federal, State, Local not specified - PPO | Source: Ambulatory Visit | Attending: Family | Admitting: Family

## 2022-06-25 DIAGNOSIS — N1831 Chronic kidney disease, stage 3a: Secondary | ICD-10-CM | POA: Diagnosis not present

## 2022-06-25 DIAGNOSIS — E559 Vitamin D deficiency, unspecified: Secondary | ICD-10-CM | POA: Diagnosis not present

## 2022-06-25 DIAGNOSIS — R748 Abnormal levels of other serum enzymes: Secondary | ICD-10-CM | POA: Insufficient documentation

## 2022-06-25 DIAGNOSIS — R945 Abnormal results of liver function studies: Secondary | ICD-10-CM | POA: Diagnosis not present

## 2022-06-25 DIAGNOSIS — D509 Iron deficiency anemia, unspecified: Secondary | ICD-10-CM | POA: Diagnosis not present

## 2022-06-25 DIAGNOSIS — Z9049 Acquired absence of other specified parts of digestive tract: Secondary | ICD-10-CM | POA: Diagnosis not present

## 2022-06-25 DIAGNOSIS — R809 Proteinuria, unspecified: Secondary | ICD-10-CM | POA: Diagnosis not present

## 2022-06-26 NOTE — Progress Notes (Signed)
abstract

## 2022-06-27 ENCOUNTER — Ambulatory Visit: Payer: Federal, State, Local not specified - PPO | Admitting: Family Medicine

## 2022-06-27 ENCOUNTER — Encounter: Payer: Self-pay | Admitting: Family

## 2022-07-02 ENCOUNTER — Encounter: Payer: Self-pay | Admitting: Family

## 2022-07-02 ENCOUNTER — Other Ambulatory Visit: Payer: Self-pay | Admitting: Family Medicine

## 2022-07-02 DIAGNOSIS — M1612 Unilateral primary osteoarthritis, left hip: Secondary | ICD-10-CM

## 2022-07-02 DIAGNOSIS — M7918 Myalgia, other site: Secondary | ICD-10-CM

## 2022-07-08 ENCOUNTER — Ambulatory Visit: Payer: Federal, State, Local not specified - PPO | Attending: Family Medicine

## 2022-07-08 ENCOUNTER — Ambulatory Visit
Payer: Federal, State, Local not specified - PPO | Attending: Student in an Organized Health Care Education/Training Program | Admitting: Student in an Organized Health Care Education/Training Program

## 2022-07-08 ENCOUNTER — Encounter: Payer: Self-pay | Admitting: Student in an Organized Health Care Education/Training Program

## 2022-07-08 VITALS — BP 119/84 | HR 87 | Temp 99.1°F | Resp 16 | Ht 63.0 in | Wt 166.0 lb

## 2022-07-08 DIAGNOSIS — M7918 Myalgia, other site: Secondary | ICD-10-CM | POA: Diagnosis not present

## 2022-07-08 DIAGNOSIS — A6920 Lyme disease, unspecified: Secondary | ICD-10-CM | POA: Diagnosis not present

## 2022-07-08 DIAGNOSIS — L4050 Arthropathic psoriasis, unspecified: Secondary | ICD-10-CM | POA: Diagnosis not present

## 2022-07-08 DIAGNOSIS — M797 Fibromyalgia: Secondary | ICD-10-CM | POA: Diagnosis not present

## 2022-07-08 DIAGNOSIS — M25511 Pain in right shoulder: Secondary | ICD-10-CM

## 2022-07-08 DIAGNOSIS — M25552 Pain in left hip: Secondary | ICD-10-CM | POA: Insufficient documentation

## 2022-07-08 DIAGNOSIS — G894 Chronic pain syndrome: Secondary | ICD-10-CM

## 2022-07-08 DIAGNOSIS — M6281 Muscle weakness (generalized): Secondary | ICD-10-CM | POA: Diagnosis not present

## 2022-07-08 DIAGNOSIS — M19011 Primary osteoarthritis, right shoulder: Secondary | ICD-10-CM | POA: Diagnosis not present

## 2022-07-08 DIAGNOSIS — M2550 Pain in unspecified joint: Secondary | ICD-10-CM

## 2022-07-08 DIAGNOSIS — M25512 Pain in left shoulder: Secondary | ICD-10-CM

## 2022-07-08 DIAGNOSIS — F32A Depression, unspecified: Secondary | ICD-10-CM

## 2022-07-08 DIAGNOSIS — M1612 Unilateral primary osteoarthritis, left hip: Secondary | ICD-10-CM | POA: Insufficient documentation

## 2022-07-08 DIAGNOSIS — M533 Sacrococcygeal disorders, not elsewhere classified: Secondary | ICD-10-CM | POA: Diagnosis not present

## 2022-07-08 DIAGNOSIS — G8929 Other chronic pain: Secondary | ICD-10-CM | POA: Insufficient documentation

## 2022-07-08 DIAGNOSIS — F419 Anxiety disorder, unspecified: Secondary | ICD-10-CM

## 2022-07-08 DIAGNOSIS — G5702 Lesion of sciatic nerve, left lower limb: Secondary | ICD-10-CM | POA: Diagnosis not present

## 2022-07-08 MED ORDER — CYCLOBENZAPRINE HCL 5 MG PO TABS: 5.0000 mg | ORAL_TABLET | Freq: Every evening | ORAL | 1 refills | Status: DC | PRN

## 2022-07-08 MED ORDER — TRAMADOL HCL ER 200 MG PO TB24: 200.0000 mg | ORAL_TABLET | Freq: Every day | ORAL | 2 refills | Status: DC

## 2022-07-08 MED ORDER — GABAPENTIN 400 MG PO CAPS: 400.0000 mg | ORAL_CAPSULE | Freq: Every day | ORAL | 2 refills | Status: DC

## 2022-07-08 MED ORDER — OXYCODONE-ACETAMINOPHEN 5-325 MG PO TABS: 1.0000 | ORAL_TABLET | Freq: Three times a day (TID) | ORAL | 0 refills | Status: AC | PRN

## 2022-07-08 NOTE — Progress Notes (Signed)
PROVIDER NOTE: Information contained herein reflects review and annotations entered in association with encounter. Interpretation of such information and data should be left to medically-trained personnel. Information provided to patient can be located elsewhere in the medical record under "Patient Instructions". Document created using STT-dictation technology, any transcriptional errors that may result from process are unintentional.    Patient: Barbara Faulkner  Service Category: E/M  Provider: Gillis Santa, MD  DOB: 1966/02/17  DOS: 02/26/2021  Specialty: Interventional Pain Management  MRN: [id number]  Setting: Ambulatory outpatient  PCP: McLean-Scocuzza, Nino Glow, MD  Type: Established Patient    Referring Provider: Orland Mustard *  Location: Office  Delivery: Face-to-face     HPI  Barbara Faulkner, a 56 y.o. year old female, is here today because of her Psoriatic arthritis ([place]) [L40.50]. 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 Pain Assessment: Severity of Chronic pain is reported as a 5 /10. Location: Other (Comment) (joint pain) Other (Comment) (generalized)/denies. Onset: More than a month ago. Quality: Discomfort, Constant, Sharp. Timing: Constant. Modifying factor(s): medications, stretching. Vitals:  height is _0  (1.6 m) and weight is 166 lb (75.3 kg). Her temporal temperature is 99.1 F (37.3 C). Her blood pressure is 119/84 and her pulse is 87. Her respiration is 16 and oxygen saturation is 99%.   Reason for encounter: medication management.     Jehieli follows up today for medication management.  Patient saw nephrology  for chronic kidney disease stage III.  She also had elevated liver enzymes secondary to drug interaction thought to be related to psychotropic medications that she is on.  She was started on Calcitrol 0.25 mcg daily.  She was advised to avoid NSAIDs and nephrotoxic medications.  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  Instructed to avoid NSAIDs and nephrotoxic medications.  She states that she has been without her tramadol for over 1 month.  She states that she was unable to come in as she was sick  Pharmacotherapy Assessment   Analgesic: Tramadol 200 mg ER, continue oxycodone 5 mg daily for breakthrough pain.     Monitoring: [place] PMP: PDMP reviewed during this encounter.       Pharmacotherapy: No side-effects or adverse reactions reported. Compliance: No problems identified. Effectiveness: Clinically acceptable.  UDS:  Summary  Date Value Ref Range Status  05/21/2021 Note  Final    Comment:    ==================================================================== [employer] Select 13 (MW) ==================================================================== Test                             Result       Flag       Units  Drug Present and Declared for Prescription Verification   Tramadol                       1367         EXPECTED   ng/mg creat   O-Desmethyltramadol            1186         EXPECTED   ng/mg creat   N-Desmethyltramadol  233          EXPECTED   ng/mg creat    Source of tramadol is a prescription medication. O-desmethyltramadol    and N-desmethyltramadol are expected metabolites of tramadol.  Drug Absent but Declared for Prescription Verification   Oxycodone                      Not Detected UNEXPECTED ng/mg creat ==================================================================== Test                      Result    Flag   Units      Ref Range   Creatinine              194              mg/dL       >=20 ==================================================================== Declared Medications:  The flagging and interpretation on this report are based on the  following declared medications.  Unexpected results may arise from  inaccuracies in the declared medications.   **Note: The testing scope of this panel includes these medications:   Oxycodone (Percocet)  Tramadol (Ultram)   **Note: The testing scope of this panel does not include the  following reported medications:   Acetaminophen (Percocet)  Albuterol (Ventolin HFA)  Cholecalciferol  Diclofenac (Voltaren)  Fluticasone (Flonase)  Gabapentin (Neurontin)  Lamotrigine (Lamictal)  Methocarbamol (Robaxin)  Pantoprazole (Protonix)  Quetiapine (Seroquel)  Rizatriptan (Maxalt) ==================================================================== For clinical consultation, please call [phone]. ====================================================================       ROS  Constitutional: Denies any fever or chills Gastrointestinal: No reported hemesis, hematochezia, vomiting, or acute GI distress Musculoskeletal:   Low back, bilateral hip pain left>right Neurological: No reported episodes of acute onset apraxia, aphasia, dysarthria, agnosia, amnesia, paralysis, loss of coordination, or loss of consciousness  Medication Review  Cholecalciferol, DULoxetine, QUEtiapine, Vitamin D, calcitRIOL, cyclobenzaprine, gabapentin, lamoTRIgine, oxyCODONE-acetaminophen, pantoprazole, and traMADol  History Review  Allergy: Barbara Faulkner is allergic to shellfish allergy and nsaids. Drug: Barbara Faulkner  reports no history of drug use. Alcohol:  reports that she does not currently use alcohol. Tobacco:  reports that she quit smoking about 18 years ago. Her smoking use included cigarettes. She has a 40.00 pack-year smoking history. She has never used smokeless tobacco. Social: Barbara Faulkner  reports that she quit smoking about 18 years  ago. Her smoking use included cigarettes. She has a 40.00 pack-year smoking history. She has never used smokeless tobacco. She reports that she does not currently use alcohol. She reports that she does not use drugs. Medical:  has a past medical history of Allergy, Anxiety, Chronic kidney disease, Chronic pain, Chronic sinusitis, COVID-19, Depression, Heart murmur, Hiatal hernia, HPV (human papilloma virus) anogenital infection, Macromastia, Migraine, Psoriatic arthritis ([place]), Shingles, and UTI (urinary tract infection). Surgical: Barbara Faulkner  has a past surgical history that includes Cholecystectomy (2009); Tubal ligation (1997); Laparoscopic gastric banding (2008); Gastric bypass; Breast biopsy (Right, 01/28/2018); Colonoscopy with propofol (N/A, 07/19/2018); Esophagogastroduodenoscopy (egd) with propofol (N/A, 07/19/2018); Breast reduction surgery (Bilateral, 04/03/2020); Colonoscopy with propofol (N/A, 09/17/2021); Colonoscopy with propofol (N/A, 09/18/2021); and Reduction mammaplasty. Family: family history includes HIV in her father; Hepatitis in her maternal uncle; Hyperlipidemia in her mother; Hypertension in her mother; Lung cancer in her maternal grandfather; Stroke in her mother.  Laboratory Chemistry Profile   Renal Lab Results  Component Value Date   BUN 18 06/11/2022   CREATININE 1.10 17/40/8144   BCR NOT APPLICABLE 81/85/6314   GFR 57.17 (  L) 06/11/2022   GFRAA >60 02/15/2014   GFRNONAA 51 (L) 05/26/2022     Hepatic Lab Results  Component Value Date   AST 85 (H) 06/11/2022   ALT 90 (H) 06/11/2022   ALBUMIN 4.0 06/11/2022   ALKPHOS 150 (H) 06/11/2022   ALKPHOS 171 (H) 06/11/2022   AMYLASE 33 09/25/2014   LIPASE 124 10/04/2013     Electrolytes Lab Results  Component Value Date   NA 142 06/11/2022   K 4.3 06/11/2022   CL 110 06/11/2022   CALCIUM 8.7 06/11/2022   MG 1.9 07/14/2018   PHOS 5.0 (H) 09/25/2014     Bone Lab Results  Component Value Date   VD25OH 40.34  06/11/2022     Inflammation (CRP: Acute Phase) (ESR: Chronic Phase) Lab Results  Component Value Date   CRP <1.0 03/10/2019   ESRSEDRATE 3 03/10/2019       Note: Above Lab results reviewed.  Recent Imaging Review  US Abdomen Limited RUQ (LIVER/GB) CLINICAL DATA:  Elevated LFTs  EXAM: ULTRASOUND ABDOMEN LIMITED RIGHT UPPER QUADRANT  COMPARISON:  None Available.  FINDINGS: Gallbladder:  Surgically absent  Common bile duct:  Diameter: 5.8 mm  Liver:  No focal lesion identified. Within normal limits in parenchymal echogenicity. Portal vein is patent on color Doppler imaging with normal direction of blood flow towards the liver.  Other: None.  IMPRESSION: 1. Status post cholecystectomy. 2. No acute process.  Electronically Signed   By: Lovey Newcomer M.D.   On: 06/25/2022 10:11  Note: Reviewed        Physical Exam  General appearance: Well nourished, well developed, and well hydrated. In no apparent acute distress Mental status: Alert, oriented x 3 (person, place, & time)       Respiratory: No evidence of acute respiratory distress Eyes: PERLA Vitals: BP 119/84 (BP Location: Right Arm, Patient Position: Sitting, Cuff Size: Large)   Pulse 87   Temp 99.1 F (37.3 C) (Temporal)   Resp 16   Ht _0  (1.6 m)   Wt 166 lb (75.3 kg)   LMP 04/02/2014   SpO2 99%   BMI 29.41 kg/m  BMI: Estimated body mass index is 29.41 kg/m as calculated from the following:   Height as of this encounter: _1  (1.6 m).   Weight as of this encounter: 166 lb (75.3 kg). Ideal: Ideal body weight: 52.4 kg (115 lb 8.3 oz) Adjusted ideal body weight: 61.6 kg (135 lb 11.4 oz)    Left greater than right hip pain, with radiation into left groin and overlying trochanteric bursa   Assessment   Status Diagnosis  Controlled Controlled Controlled 1. Psoriatic arthritis ([place])   2. Primary osteoarthritis of left hip   3. 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 Glenohumeral arthritis, right    5. Fibromyalgia   6. Anxiety and depression   7. Chronic pain of both shoulders   8. Chronic pain syndrome   9. Chronic musculoskeletal pain          Plan of Care  Barbara Faulkner has a current medication list which includes the following long-term medication(s): duloxetine, quetiapine, gabapentin, and pantoprazole.  Pharmacotherapy (Medications Ordered): Meds ordered this encounter  Medications   traMADol (ULTRAM-ER) 200 MG 24 hr tablet    Sig: Take 1 tablet (200 mg total) by mouth daily.    Dispense:  30 tablet    Refill:  2   gabapentin (NEURONTIN) 400 MG capsule    Sig:  Take 1 capsule (400 mg total) by mouth at bedtime.    Dispense:  90 capsule    Refill:  2   cyclobenzaprine (FLEXERIL) 5 MG tablet    Sig: Take 1 tablet (5 mg total) by mouth at bedtime as needed for muscle spasms.    Dispense:  90 tablet    Refill:  1   oxyCODONE-acetaminophen (PERCOCET) 5-325 MG tablet    Sig: Take 1 tablet by mouth every 8 (eight) hours as needed for severe pain. Must last 30 days.    Dispense:  90 tablet    Refill:  0    Chronic Pain: STOP Act (Not applicable) Fill 1 day early if closed on refill date. Avoid benzodiazepines within 8 hours of opioids   Percocet prescription as above for quantity 90 to last 3 months Review urine toxicology screen at next visit.   Follow-up plan:   Return in about 3 months (around 10/08/2022) for Medication Management, in person.   Recent Visits No visits were found meeting these conditions. Showing recent visits within past 90 days and meeting all other requirements Today's Visits Date Type Provider Dept  07/08/22 Office Visit Gillis Santa, MD [facility]  Showing today's visits and meeting all other requirements Future Appointments Date Type Provider Dept  10/02/22 Appointment Gillis Santa, MD [facility]  Showing future appointments within next 90 days and meeting all other requirements  I discussed the assessment  and treatment plan with the patient. The patient was provided an opportunity to ask questions and all were answered. The patient agreed with the plan and demonstrated an understanding of the instructions.  Patient advised to call back or seek an in-person evaluation if the symptoms or condition worsens.  Duration of encounter:85mnutes.  Note by: BGillis Santa MD Date: 07/08/2022; Time: 9:50 AM

## 2022-07-08 NOTE — Progress Notes (Signed)
Nursing Pain Medication Assessment:  Safety precautions to be maintained throughout the outpatient stay will include: orient to surroundings, keep bed in low position, maintain call bell within reach at all times, provide assistance with transfer out of bed and ambulation.  Medication Inspection Compliance: Pill count conducted under aseptic conditions, in front of the patient. Neither the pills nor the bottle was removed from the patient's sight at any time. Once count was completed pills were immediately returned to the patient in their original bottle.  Medication #1: Oxycodone IR Pill/Patch Count:  11 of 90 pills remain Pill/Patch Appearance: Markings consistent with prescribed medication Bottle Appearance: Standard pharmacy container. Clearly labeled. Filled Date: 06 / 06 / 2023 Last Medication intake:  Today  Medication #2: Tramadol (Ultram) Pill/Patch Count:  0 of 30 pills remain Pill/Patch Appearance:  EMPTY  Bottle Appearance: Standard pharmacy container. Clearly labeled. Filled Date: 08 / 31 / 2023 Last Medication intake:  Ran out of medicine more than 48 hours ago

## 2022-07-08 NOTE — Therapy (Signed)
OUTPATIENT PHYSICAL THERAPY HIP EVALUATION  Patient Name: Barbara Faulkner MRN: [id number] DOB:03-Aug-1966, 56 y.o., female Today's Date: 07/10/2022   PT End of Session - 07/10/22 1302     Visit Number 1    Number of Visits 17    Date for PT Re-Evaluation 09/02/22    Authorization Type eval: 07/08/22    PT Start Time 1452    PT Stop Time 1530    PT Time Calculation (min) 38 min    Activity Tolerance Patient tolerated treatment well    Behavior During Therapy [facility] for tasks assessed/performed            Past Medical History:  Diagnosis Date   Allergy    Anxiety    Chronic kidney disease    Chronic pain    Chronic sinusitis    COVID-19    09/14/20, 06/13/21   Depression    Heart murmur    Hiatal hernia    HPV (human papilloma virus) anogenital infection    Macromastia    Migraine    Psoriatic arthritis (HCC)    Shingles    UTI (urinary tract infection)    ecoli 09/2021   Past Surgical History:  Procedure Laterality Date   BREAST BIOPSY Right 01/28/2018   US guided biopsy - heart shaped   BREAST REDUCTION SURGERY Bilateral 04/03/2020   Procedure: MAMMARY REDUCTION  (BREAST);  Surgeon: Contogiannis, Chales Abrahams, MD;  Location: [facility];  Service: Plastics;  Laterality: Bilateral;  HAVE LIPOSUCTION MACHINE AVAILABLE   CHOLECYSTECTOMY  2009   COLONOSCOPY WITH PROPOFOL N/A 07/19/2018   Procedure: COLONOSCOPY WITH PROPOFOL;  Surgeon: Toney Reil, MD;  Location: [facility] ENDOSCOPY;  Service: Gastroenterology;  Laterality: N/A;   COLONOSCOPY WITH PROPOFOL N/A 09/17/2021   Procedure: COLONOSCOPY WITH PROPOFOL;  Surgeon: Toney Reil, MD;  Location: [facility] ENDOSCOPY;  Service: Gastroenterology;  Laterality: N/A;   COLONOSCOPY WITH PROPOFOL N/A 09/18/2021   Procedure: COLONOSCOPY WITH PROPOFOL;  Surgeon: Toney Reil, MD;  Location: [facility] ENDOSCOPY;  Service: Gastroenterology;  Laterality: N/A;   ESOPHAGOGASTRODUODENOSCOPY (EGD) WITH PROPOFOL N/A  07/19/2018   Procedure: ESOPHAGOGASTRODUODENOSCOPY (EGD) WITH PROPOFOL;  Surgeon: Toney Reil, MD;  Location: [facility] ENDOSCOPY;  Service: Gastroenterology;  Laterality: N/A;   GASTRIC BYPASS     2015; duodenal switch    LAPAROSCOPIC GASTRIC BANDING  2008   removed 2009   REDUCTION MAMMAPLASTY     TUBAL LIGATION  1997   Patient Active Problem List   Diagnosis Date Noted   Normocytic anemia 06/13/2022   Elevated alkaline phosphatase level 05/28/2022   Chills 05/26/2022   Chronic left SI joint pain 11/13/2021   Piriformis syndrome, left 11/13/2021   Greater 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 11/13/2021   Primary osteoarthritis of right hip 09/16/2021   Plantar fasciitis 08/22/2021   Carpal tunnel syndrome on right 08/22/2021   Primary osteoarthritis of left hip 08/15/2021   Hyperphosphatemia 08/01/2021   Anemia in chronic kidney disease 04/18/2021   Chronic kidney disease, stage 3a (HCC) 04/18/2021   Hyperparathyroidism due to renal insufficiency (HCC) 04/18/2021   Hypertension 04/18/2021   Hiatal hernia 11/02/2020   History of migraine 11/02/2020   Gastroesophageal reflux disease 11/02/2020   Brain fog 11/02/2020   Depression, recurrent (HCC) 03/18/2020   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 09/08/2018   Arthropathy of right shoulder 09/08/2018   Controlled substance agreement signed 09/08/2018   Chronic musculoskeletal pain 08/24/2018   Routine physical examination 08/24/2018  Iron deficiency anemia 08/24/2018   Elevated liver enzymes 07/19/2018   Vitamin D deficiency 07/19/2018   Colon cancer screening    Abdominal pain, epigastric 07/08/2018   Right shoulder pain 06/15/2018   Hemorrhoids 05/01/2017   Anxiety and depression 11/24/2016   Back pain 09/11/2016   Large breasts 09/11/2016   Parotiditis 05/01/2016   Abnormal laboratory test result 03/26/2016   Proteinuria 03/26/2016   Right hip pain 04/18/2015   Acute sinusitis 01/08/2014   Psoriatic  arthritis (HCC) 09/13/2013   DUB (dysfunctional uterine bleeding) 10/27/2012   Gastritis due to nonsteroidal anti-inflammatory drug 12/12/2011   Anaphylactic reaction due to shellfish 09/27/2010   THYROMEGALY 09/03/2010   ALLERGIC RHINITIS 03/15/2010   MIGRAINE [name] 05/17/2008   PCP: Rennie PlowmanMargaret Arnett FNP  REFERRING PROVIDER: Joseph BerkshireJason Matthews MD  REFERRING DIAG: [phone] (ICD-10-CM) - Primary osteoarthritis of left hip, M53.3,G89.29 (ICD-10-CM) - Chronic left SI joint pain, G57.02 (ICD-10-CM) - Piriformis syndrome, left, M25.552 (ICD-10-CM) - Greater 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  Of note she has severe L hip pain as well as pain in her L knee and right shoulder. Her L hip pain has been worsening recently. She denies any pain in her R hip or L shoulder. She was previously receiving L hip injections from Dr. [name] however she reports that he refused to continue with further injections so she was referred by pain management to Dr. [name]. She saw Dr. [name] and pt received a L hip intraarticular injection as well as an injection in her L SIJ and L greater trochanter. The injections have helped with her pain. She was also referred to an orthopedic surgeon to discuss THR and referred for physical therapy. Pt expresses that she really enjoys exercising but is very limited  by her pain.   PAIN:  Pain Intensity: Present: 4/10, Best: 1/10, Worst: 10/10 Pain location: L groin, L greater trochanter, and L posterior hip Pain Quality: sharp  Radiating: Yes, down the posterior L thigh Numbness/Tingling: Yes, tingling in R hand Focal Weakness: Yes, weakness  related to pain Position of comfort: laying down (supine and R sidelying), occasionally pressure on L hip is helpful Aggravating factors: extended standing, extended sitting,  Relieving factors: exiting the bed on the right side, "I hate ice but it works," hot shower/bath helps, massage, stretches, deep squat, medication (takes at night for sleep),  24-hour pain behavior: worse at the end of the day How long can you sit: 10 minutes How long can you stand: 8-10 hours History of prior back injury, pain, surgery, or therapy: No Dominant hand: right Imaging: Yes, IMPRESSION: 1. 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 2. Moderate pubic symphysis osteoarthritis. Red flags: Negative for bowel/bladder changes, saddle paresthesia, personal history of cancer, h/o spinal tumors, h/o compression fx, h/o abdominal aneurysm, abdominal pain, chills/fever, night sweats, nausea, vomiting, unrelenting pain,   PRECAUTIONS: None  WEIGHT BEARING RESTRICTIONS: No  FALLS: Has patient fallen in last 6 months? No  Living Environment Lives with: lives with their spouse Lives in: House/apartment Stairs: Yes: Internal: 12 steps; on right going up Has following equipment at home: Single point cane, Walker - 2 wheeled, and Shower bench  Prior level of function: Independent  Occupational demands: works at [employer], standing for 8-10 hour shifts  Hobbies: Exercising (doesn't like water aerobics)  Patient Goals: Return to exercising.   OBJECTIVE:   Patient Surveys  FOTO 52, predicted improvement to 29  Cognition Patient is oriented to person, place, and time.  Recent memory is  intact.  Remote memory is intact.  Attention span and concentration are intact.  Expressive speech is intact.  Patient's fund of knowledge is within normal limits for educational level.    Gross Musculoskeletal Assessment Tremor: None Bulk: Normal Tone: Normal  GAIT: Antalgic gait on LLE but full gait assessment deferred  Posture: Lumbar lordosis: WNL Iliac crest height: Equal bilaterally Lumbar lateral shift: Negative  AROM AROM (Normal range in degrees) AROM   Lumbar   Flexion (65) WNL  Extension (30) Mild limitation with L hip pain during overpressure  Right lateral flexion (25) Mild limitation with L hip pain  Left lateral flexion (25) WNL with mild L hip pain  Right rotation (30)   Left rotation (30)       Hip Right Left  Flexion (125) WNL Limited around 90 degrees  Extension (15)    Abduction (40)    Adduction     Internal Rotation (45) To be measured To be measured  External Rotation (45) To be measured To be measured      Knee    Flexion (135) WNL WNL  Extension (0) 0 0      Ankle    Dorsiflexion (20)    Plantarflexion (50)    Inversion (35)    Eversion (15    (* = pain; Blank rows = not tested)  LE MMT: MMT (out of 5) Right  Left   Hip flexion 5 4+*  Hip extension    Hip abduction 4+ 4*  Hip adduction 4+ 4*  Hip internal rotation 5 5  Hip external rotation 5 4*  Knee flexion 4+ 4+*  Knee extension 5 5  Ankle dorsiflexion 5 5  Ankle plantarflexion    Ankle inversion    Ankle eversion    (* = pain; Blank rows = not tested)  Sensation Deferred  Reflexes Deferred   Muscle Length Hamstrings: R: Positive for shortening around 70 degrees L: Positive for shortening around 70 degrees E[place] (quadriceps): R: Not examined L:  Not examined Thomas (hip flexors): R: Not examined L: Not examined Ober: R: Not examined L: Not examined  Palpation Location Right Left         Lumbar paraspinals 1 1  Quadratus Lumborum    Gluteus Maximus 1 2  Gluteus  Medius 2 2  Deep hip external rotators 2 2  PSIS 1 2  Fortin's Area (SIJ) 0 0  Greater Trochanter 0 0  (Blank rows = not tested) Graded on 0-4 scale (0 = no pain, 1 = pain, 2 = pain with wincing/grimacing/flinching, 3 = pain with withdrawal, 4 = unwilling to allow palpation)  Passive Accessory Intervertebral Motion Generally hypomobile throughout lumbar spine with increased tenderness in lower lumbar segments  Special Tests  Lumbar Radiculopathy and Discogenic: Centralization and Peripheralization (SN 92, -LR 0.12): Not examined Slump (SN 83, -LR 0.32): R: Negative L: Negative SLR (SN 92, -LR 0.29): R: Negative L:  Negative Crossed SLR (SP 90): R: Negative L: Negative  Facet Joint: Extension-Rotation (SN 100, -LR 0.0): R: Negative L: Negative  Lumbar Foraminal Stenosis: Lumbar quadrant (SN 70): R: Negative L: Negative  Hip: FABER (SN 81): R: Negative L: Positive FADIR (SN 94): R: Negative L: Positive Hip scour (SN 50): R: Negative L: Negative  SIJ:  Thigh Thrust (SN 88, -LR 0.18) : R: Not examined L: Not examined  Piriformis Syndrome: FAIR Test (SN 88, SP 83): R: Not examined L: Not examined  Functional Tasks: Deferred  Beighton scale: Deferred    PATIENT EDUCATION:  Education details: Plan of care and examination findings Person educated: Patient Education method: Explanation Education comprehension: verbalized understanding   HOME EXERCISE PROGRAM:  None currently, to be issued at follow-up   ASSESSMENT:  CLINICAL IMPRESSION: Patient is a 56 y.o. female who was seen today for physical therapy evaluation and treatment for L hip pain. Significant findings during the examination include limitation in L hip flexion to around 90 degrees. Weak and painful L hip strength testing with significant tenderness to palpation generally around L hip and pelvis including bilateral SIJ.  OBJECTIVE IMPAIRMENTS: Abnormal gait, decreased ROM, decreased strength, and pain.    ACTIVITY LIMITATIONS: lifting, bending, standing, and squatting  PARTICIPATION LIMITATIONS: cleaning, shopping, community activity, and occupation  PERSONAL FACTORS: Past/current experiences, Time since onset of injury/illness/exacerbation, and 3+ comorbidities: depression, anxiety, migraines, chronic pain, and psoriatic arthritis  are also affecting patient's functional outcome.   REHAB POTENTIAL: Fair    CLINICAL DECISION MAKING: Unstable/unpredictable  EVALUATION COMPLEXITY: High   GOALS: Goals reviewed with patient? No  SHORT TERM GOALS: Target date: 08/05/2022  Pt will be independent with HEP in order to improve strength and range of motion as well as decrease hip pain to improve function at home and work. Baseline:  Goal status: INITIAL   LONG TERM GOALS: Target date: 09/02/2022  Pt will increase FOTO to at least 66 to demonstrate significant improvement in function at home and work related to L hip pain  Baseline: 07/08/22: 52 Goal status: INITIAL  2.  Pt will decrease worst hip pain by at least 2 points on the NPRS in order to demonstrate clinically significant reduction in hip pain. Baseline: 07/08/22: worst: 10/10 Goal status: INITIAL  3.  Pt will report at least 25% improvement in L hip symptoms in order to demonstrate clinically significant reduction in hip pain/disability so she can return to increased frequency of exercise with less pain       Baseline:  Goal status: INITIAL  4.  Pt  will increase pain-free strength of L hip abduction/adduction by at least 1/2 MMT grade in order to demonstrate improvement in strength and function  Baseline: 07/08/22: 4/5 both with pain; Goal status: INITIAL   PLAN: PT FREQUENCY: 1-2x/week  PT DURATION: 8 weeks  PLANNED INTERVENTIONS: Therapeutic exercises, Therapeutic activity, Neuromuscular re-education, Balance training, Gait training, Patient/Family education, Self Care, Joint mobilization, Joint manipulation, Vestibular  training, Canalith repositioning, Orthotic/Fit training, DME instructions, Dry Needling, Electrical stimulation, Spinal manipulation, Spinal mobilization, Cryotherapy, Moist heat, Taping, Traction, Ultrasound, Ionotophoresis 4mg /ml Dexamethasone, Manual therapy, and Re-evaluation.  PLAN FOR NEXT SESSION: measure hip IR/ER, gait assessment, initiate low level strengthening, therapeutic neuroscience education, issue HEP   Idalis Hoelting PT, DPT, GCS  Barbara Faulkner, PT 07/10/2022, 2:07 PM

## 2022-07-10 ENCOUNTER — Ambulatory Visit: Payer: Federal, State, Local not specified - PPO

## 2022-07-10 DIAGNOSIS — G5702 Lesion of sciatic nerve, left lower limb: Secondary | ICD-10-CM | POA: Diagnosis not present

## 2022-07-10 DIAGNOSIS — M6281 Muscle weakness (generalized): Secondary | ICD-10-CM

## 2022-07-10 DIAGNOSIS — M533 Sacrococcygeal disorders, not elsewhere classified: Secondary | ICD-10-CM | POA: Diagnosis not present

## 2022-07-10 DIAGNOSIS — M1612 Unilateral primary osteoarthritis, left hip: Secondary | ICD-10-CM | POA: Diagnosis not present

## 2022-07-10 DIAGNOSIS — G8929 Other chronic pain: Secondary | ICD-10-CM | POA: Diagnosis not present

## 2022-07-10 DIAGNOSIS — M25552 Pain in left hip: Secondary | ICD-10-CM

## 2022-07-10 NOTE — Therapy (Signed)
OUTPATIENT PHYSICAL THERAPY HIP TREATMENT  Patient Name: ROSHONDA SPERL MRN: [id number] DOB:1966/05/09, 56 y.o., female Today's Date: 07/11/2022   PT End of Session - 07/10/22 1452     Visit Number 2    Number of Visits 17    Date for PT Re-Evaluation 09/02/22    Authorization Type eval: 07/08/22    PT Start Time 1450    PT Stop Time 1530    PT Time Calculation (min) 40 min    Activity Tolerance Patient tolerated treatment well    Behavior During Therapy [facility] for tasks assessed/performed            Past Medical History:  Diagnosis Date   Allergy    Anxiety    Chronic kidney disease    Chronic pain    Chronic sinusitis    COVID-19    09/14/20, 06/13/21   Depression    Heart murmur    Hiatal hernia    HPV (human papilloma virus) anogenital infection    Macromastia    Migraine    Psoriatic arthritis (HCC)    Shingles    UTI (urinary tract infection)    ecoli 09/2021   Past Surgical History:  Procedure Laterality Date   BREAST BIOPSY Right 01/28/2018   US guided biopsy - heart shaped   BREAST REDUCTION SURGERY Bilateral 04/03/2020   Procedure: MAMMARY REDUCTION  (BREAST);  Surgeon: Contogiannis, Chales Abrahams, MD;  Location: [facility];  Service: Plastics;  Laterality: Bilateral;  HAVE LIPOSUCTION MACHINE AVAILABLE   CHOLECYSTECTOMY  2009   COLONOSCOPY WITH PROPOFOL N/A 07/19/2018   Procedure: COLONOSCOPY WITH PROPOFOL;  Surgeon: Toney Reil, MD;  Location: [facility] ENDOSCOPY;  Service: Gastroenterology;  Laterality: N/A;   COLONOSCOPY WITH PROPOFOL N/A 09/17/2021   Procedure: COLONOSCOPY WITH PROPOFOL;  Surgeon: Toney Reil, MD;  Location: [facility] ENDOSCOPY;  Service: Gastroenterology;  Laterality: N/A;   COLONOSCOPY WITH PROPOFOL N/A 09/18/2021   Procedure: COLONOSCOPY WITH PROPOFOL;  Surgeon: Toney Reil, MD;  Location: [facility] ENDOSCOPY;  Service: Gastroenterology;  Laterality: N/A;   ESOPHAGOGASTRODUODENOSCOPY (EGD) WITH PROPOFOL N/A  07/19/2018   Procedure: ESOPHAGOGASTRODUODENOSCOPY (EGD) WITH PROPOFOL;  Surgeon: Toney Reil, MD;  Location: [facility] ENDOSCOPY;  Service: Gastroenterology;  Laterality: N/A;   GASTRIC BYPASS     2015; duodenal switch    LAPAROSCOPIC GASTRIC BANDING  2008   removed 2009   REDUCTION MAMMAPLASTY     TUBAL LIGATION  1997   Patient Active Problem List   Diagnosis Date Noted   Normocytic anemia 06/13/2022   Elevated alkaline phosphatase level 05/28/2022   Chills 05/26/2022   Chronic left SI joint pain 11/13/2021   Piriformis syndrome, left 11/13/2021   Greater 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 11/13/2021   Primary osteoarthritis of right hip 09/16/2021   Plantar fasciitis 08/22/2021   Carpal tunnel syndrome on right 08/22/2021   Primary osteoarthritis of left hip 08/15/2021   Hyperphosphatemia 08/01/2021   Anemia in chronic kidney disease 04/18/2021   Chronic kidney disease, stage 3a (HCC) 04/18/2021   Hyperparathyroidism due to renal insufficiency (HCC) 04/18/2021   Hypertension 04/18/2021   Hiatal hernia 11/02/2020   History of migraine 11/02/2020   Gastroesophageal reflux disease 11/02/2020   Brain fog 11/02/2020   Depression, recurrent (HCC) 03/18/2020   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 09/08/2018   Arthropathy of right shoulder 09/08/2018   Controlled substance agreement signed 09/08/2018   Chronic musculoskeletal pain 08/24/2018   Routine physical examination 08/24/2018  Iron deficiency anemia 08/24/2018   Elevated liver enzymes 07/19/2018   Vitamin D deficiency 07/19/2018   Colon cancer screening    Abdominal pain, epigastric 07/08/2018   Right shoulder pain 06/15/2018   Hemorrhoids 05/01/2017   Anxiety and depression 11/24/2016   Back pain 09/11/2016   Large breasts 09/11/2016   Parotiditis 05/01/2016   Abnormal laboratory test result 03/26/2016   Proteinuria 03/26/2016   Right hip pain 04/18/2015   Acute sinusitis 01/08/2014   Psoriatic  arthritis (HCC) 09/13/2013   DUB (dysfunctional uterine bleeding) 10/27/2012   Gastritis due to nonsteroidal anti-inflammatory drug 12/12/2011   Anaphylactic reaction due to shellfish 09/27/2010   THYROMEGALY 09/03/2010   ALLERGIC RHINITIS 03/15/2010   MIGRAINE [name] 05/17/2008   PCP: Rennie Plowman FNP  REFERRING PROVIDER: [name] MD  REFERRING DIAG: [phone] (ICD-10-CM) - Primary osteoarthritis of left hip, M53.3,G89.29 (ICD-10-CM) - Chronic left SI joint pain, G57.02 (ICD-10-CM) - Piriformis syndrome, left, M25.552 (ICD-10-CM) - Greater 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  Rationale for Evaluation and Treatment: Rehabilitation  THERAPY DIAG: Pain in left hip  Muscle weakness (generalized)  ONSET DATE: 09/02/2015 (approximate)  FOLLOW-UP APPT SCHEDULED WITH REFERRING PROVIDER: Yes   FROM INITIAL EVALUATION SUBJECTIVE:                                                                                                                                                                                         SUBJECTIVE STATEMENT: L hip pain  PERTINENT HISTORY:  Pt reports a diagnosis of Lyme Disease in 2017 with persistent widespread chronic joint pain after treatment.  Of note she has severe L hip pain as well as pain in her L knee and right shoulder. Her L hip pain has been worsening recently. She denies any pain in her R hip or L shoulder. She was previously receiving L hip injections from Dr. Martha Clan however she reports that he refused to continue with further injections so she was referred by pain management to Dr. Ashley Royalty. She saw Dr. Ashley Royalty and pt received a L hip intraarticular injection as well as an injection in her L SIJ and L greater trochanter. The injections have helped with her pain. She was also referred to an orthopedic surgeon to discuss THR and referred for physical therapy. Pt expresses that she really enjoys  exercising but is very limited by her pain.   PAIN:  Pain Intensity: Present: 4/10, Best: 1/10, Worst: 10/10 Pain location: L groin, L greater trochanter, and L posterior hip Pain Quality: sharp  Radiating: Yes, down the posterior L thigh Numbness/Tingling: Yes, tingling in R hand Focal Weakness:  Yes, weakness related to pain Position of comfort: laying down (supine and R sidelying), occasionally pressure on L hip is helpful Aggravating factors: extended standing, extended sitting,  Relieving factors: exiting the bed on the right side, "I hate ice but it works," hot shower/bath helps, massage, stretches, deep squat, medication (takes at night for sleep),  24-hour pain behavior: worse at the end of the day How long can you sit: 10 minutes How long can you stand: 8-10 hours History of prior back injury, pain, surgery, or therapy: No Dominant hand: right Imaging: Yes, IMPRESSION: 1. 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 2. Moderate pubic symphysis osteoarthritis. Red flags: Negative for bowel/bladder changes, saddle paresthesia, personal history of cancer, h/o spinal tumors, h/o compression fx, h/o abdominal aneurysm, abdominal pain, chills/fever, night sweats, nausea, vomiting, unrelenting pain,   PRECAUTIONS: None  WEIGHT BEARING RESTRICTIONS: No  FALLS: Has patient fallen in last 6 months? No  Living Environment Lives with: lives with their spouse Lives in: House/apartment Stairs: Yes: Internal: 12 steps; on right going up Has following equipment at home: Single point cane, Walker - 2 wheeled, and Shower bench  Prior level of function: Independent  Occupational demands: works at [employer], standing for 8-10 hour shifts  Hobbies: Exercising (doesn't like water aerobics)  Patient Goals: Return to exercising.   OBJECTIVE:   Patient Surveys  FOTO 52, predicted improvement to 36  Cognition Patient is oriented to person, place, and  time.  Recent memory is intact.  Remote memory is intact.  Attention span and concentration are intact.  Expressive speech is intact.  Patient's fund of knowledge is within normal limits for educational level.    Gross Musculoskeletal Assessment Tremor: None Bulk: Normal Tone: Normal  GAIT: Antalgic gait on LLE but full gait assessment deferred  Posture: Lumbar lordosis: WNL Iliac crest height: Equal bilaterally Lumbar lateral shift: Negative  AROM AROM (Normal range in degrees) AROM   Lumbar   Flexion (65) WNL  Extension (30) Mild limitation with L hip pain during overpressure  Right lateral flexion (25) Mild limitation with L hip pain  Left lateral flexion (25) WNL with mild L hip pain  Right rotation (30)   Left rotation (30)       Hip Right Left  Flexion (125) WNL Limited around 90 degrees  Extension (15)    Abduction (40)    Adduction     Internal Rotation (45) To be measured To be measured  External Rotation (45) To be measured To be measured      Knee    Flexion (135) WNL WNL  Extension (0) 0 0      Ankle    Dorsiflexion (20)    Plantarflexion (50)    Inversion (35)    Eversion (15    (* = pain; Blank rows = not tested)  LE MMT: MMT (out of 5) Right  Left   Hip flexion 5 4+*  Hip extension    Hip abduction 4+ 4*  Hip adduction 4+ 4*  Hip internal rotation 5 5  Hip external rotation 5 4*  Knee flexion 4+ 4+*  Knee extension 5 5  Ankle dorsiflexion 5 5  Ankle plantarflexion    Ankle inversion    Ankle eversion    (* = pain; Blank rows = not tested)  Sensation Deferred  Reflexes Deferred   Muscle Length Hamstrings: R: Positive for shortening around 70 degrees L: Positive for shortening around 70 degrees [place] (quadriceps): R: Not  examined L: Not examined Thomas (hip flexors): R: Not examined L: Not examined Ober: R: Not examined L: Not examined  Palpation Location Right Left         Lumbar paraspinals 1 1  Quadratus Lumborum     Gluteus Maximus 1 2  Gluteus Medius 2 2  Deep hip external rotators 2 2  PSIS 1 2  Fortin's Area (SIJ) 0 0  Greater Trochanter 0 0  (Blank rows = not tested) Graded on 0-4 scale (0 = no pain, 1 = pain, 2 = pain with wincing/grimacing/flinching, 3 = pain with withdrawal, 4 = unwilling to allow palpation)  Passive Accessory Intervertebral Motion Generally hypomobile throughout lumbar spine with increased tenderness in lower lumbar segments  Special Tests  Lumbar Radiculopathy and Discogenic: Centralization and Peripheralization (SN 92, -LR 0.12): Not examined Slump (SN 83, -LR 0.32): R: Negative L: Negative SLR (SN 92, -LR 0.29): R: Negative L:  Negative Crossed SLR (SP 90): R: Negative L: Negative  Facet Joint: Extension-Rotation (SN 100, -LR 0.0): R: Negative L: Negative  Lumbar Foraminal Stenosis: Lumbar quadrant (SN 70): R: Negative L: Negative  Hip: FABER (SN 81): R: Negative L: Positive FADIR (SN 94): R: Negative L: Positive Hip scour (SN 50): R: Negative L: Negative  SIJ:  Thigh Thrust (SN 88, -LR 0.18) : R: Not examined L: Not examined  Piriformis Syndrome: FAIR Test (SN 88, SP 83): R: Not examined L: Not examined  Functional Tasks: Deferred  Beighton scale: Deferred   TODAY'S TREATMENT   SUBJECTIVE: Persistent L hip pain upon arrival. No significant change since the initial evaluation. Pt took pain medication about an hour before arrival. No specific questions or concerns.  PAIN: Persistent L hip pain  Ther-ex  Pt completed neurophysiology of pain questionnaire; Hooklying lumbar rotation rocking within limited range of motion 2 x 60s; Hooklying low marches x 10 BLE; Light isometric clams 5s hold x 10; Light isometric adductor squeeze 5s hold x 10; Therapeutic neuroscience pain education provided throughout session   Manual Therapy  Moist heat pack applied to L hip in supine with bolster under knees during interval history x 5 minutes (3 minutes  unbilled); Supine L hip AP mobilizations at neutral, grade I, 30s/bout x 3 bouts; Supine L hip long axis gentle distraction with belt assist, 30s/bout x 3 bouts; Supine L hip gentle medial to lateral mobilizations with hip flexed to 45 degrees, belt assist, 30s/bout x 3 bouts; Supine L hip gentle medial to lateral/inferior mobilizations with hip flexed to 45 degrees and slightly externally rotated within comfortable range, belt assist, 30s/bout x 3 bouts; STM to L anterior and lateral hip with Theraband Roller for neural stimulation, tissue extensibility, and pain control;   PATIENT EDUCATION:  Education details: Pt educated throughout session about proper posture and technique with exercises. Improved exercise technique, movement at target joints, use of target muscles after min to mod verbal, visual, tactile cues, Therapeutic neuroscience pain education, HEP Person educated: Patient Education method: Explanation and handout Education comprehension: verbalized understanding   HOME EXERCISE PROGRAM:  Access Code: [email or web address] URL: [email or web address] Date: 07/11/2022 Prepared by: Ria Comment  Exercises - Hooklying Lumbar Rotation  - 1 x daily - 7 x weekly - 2 sets - 10 reps - 3s hold - Supine March  - 1 x daily - 7 x weekly - 2 sets - 10 reps - 3s hold   ASSESSMENT:  CLINICAL IMPRESSION: Initiated light manual techniques to L hip for pain modulation, neural stimulation, and  tissue extensibility. Performed light strengthening during session today. HEP issued and reviewed with patient. Pt completed neurophysiology of pain questionnaire and therapeutic neuroscience pain education provided throughout session. Pt encouraged to follow-up as scheduled. She will benefit from PT services to address deficits in strength, range of motion, and pain in order to improve pain with household and work responsibilities.   OBJECTIVE IMPAIRMENTS: Abnormal gait, decreased ROM, decreased  strength, and pain.   ACTIVITY LIMITATIONS: lifting, bending, standing, and squatting  PARTICIPATION LIMITATIONS: cleaning, shopping, community activity, and occupation  PERSONAL FACTORS: Past/current experiences, Time since onset of injury/illness/exacerbation, and 3+ comorbidities: depression, anxiety, migraines, chronic pain, and psoriatic arthritis  are also affecting patient's functional outcome.   REHAB POTENTIAL: Fair    CLINICAL DECISION MAKING: Unstable/unpredictable  EVALUATION COMPLEXITY: High   GOALS: Goals reviewed with patient? No  SHORT TERM GOALS: Target date: 08/05/2022  Pt will be independent with HEP in order to improve strength and range of motion as well as decrease hip pain to improve function at home and work. Baseline:  Goal status: INITIAL   LONG TERM GOALS: Target date: 09/02/2022  Pt will increase FOTO to at least 66 to demonstrate significant improvement in function at home and work related to L hip pain  Baseline: 07/08/22: 52 Goal status: INITIAL  2.  Pt will decrease worst hip pain by at least 2 points on the NPRS in order to demonstrate clinically significant reduction in hip pain. Baseline: 07/08/22: worst: 10/10 Goal status: INITIAL  3.  Pt will report at least 25% improvement in L hip symptoms in order to demonstrate clinically significant reduction in hip pain/disability so she can return to increased frequency of exercise with less pain       Baseline:  Goal status: INITIAL  4.  Pt will increase pain-free strength of L hip abduction/adduction by at least 1/2 MMT grade in order to demonstrate improvement in strength and function  Baseline: 07/08/22: 4/5 both with pain; Goal status: INITIAL   PLAN: PT FREQUENCY: 1-2x/week  PT DURATION: 8 weeks  PLANNED INTERVENTIONS: Therapeutic exercises, Therapeutic activity, Neuromuscular re-education, Balance training, Gait training, Patient/Family education, Self Care, Joint mobilization, Joint  manipulation, Vestibular training, Canalith repositioning, Orthotic/Fit training, DME instructions, Dry Needling, Electrical stimulation, Spinal manipulation, Spinal mobilization, Cryotherapy, Moist heat, Taping, Traction, Ultrasound, Ionotophoresis 4mg /ml Dexamethasone, Manual therapy, and Re-evaluation.  PLAN FOR NEXT SESSION: measure hip IR/ER, gait assessment, continue low level strengthening, therapeutic neuroscience education, review/modify HEP as needed   Achaia Garlock PT, DPT, GCS  Taysen Bushart, PT 07/11/2022, 9:13 AM

## 2022-07-17 ENCOUNTER — Ambulatory Visit: Payer: Federal, State, Local not specified - PPO

## 2022-07-17 DIAGNOSIS — M1612 Unilateral primary osteoarthritis, left hip: Secondary | ICD-10-CM | POA: Diagnosis not present

## 2022-07-17 DIAGNOSIS — G8929 Other chronic pain: Secondary | ICD-10-CM | POA: Diagnosis not present

## 2022-07-17 DIAGNOSIS — M25552 Pain in left hip: Secondary | ICD-10-CM | POA: Diagnosis not present

## 2022-07-17 DIAGNOSIS — G5702 Lesion of sciatic nerve, left lower limb: Secondary | ICD-10-CM | POA: Diagnosis not present

## 2022-07-17 DIAGNOSIS — M6281 Muscle weakness (generalized): Secondary | ICD-10-CM | POA: Diagnosis not present

## 2022-07-17 DIAGNOSIS — M533 Sacrococcygeal disorders, not elsewhere classified: Secondary | ICD-10-CM | POA: Diagnosis not present

## 2022-07-17 NOTE — Therapy (Signed)
OUTPATIENT PHYSICAL THERAPY HIP TREATMENT  Patient Name: Barbara Faulkner MRN: [id number] DOB:[birth year], 56 y.o., female Today's Date: 07/17/2022   PT End of Session - 07/17/22 1445     Visit Number 3    Number of Visits 17    Date for PT Re-Evaluation 09/02/22    Authorization Type eval: 07/08/22    PT Start Time 1445    PT Stop Time 1530    PT Time Calculation (min) 45 min    Activity Tolerance Patient tolerated treatment well    Behavior During Therapy [facility] for tasks assessed/performed            Past Medical History:  Diagnosis Date   Allergy    Anxiety    Chronic kidney disease    Chronic pain    Chronic sinusitis    COVID-19    09/14/20, 06/13/21   Depression    Heart murmur    Hiatal hernia    HPV (human papilloma virus) anogenital infection    Macromastia    Migraine    Psoriatic arthritis ([place])    Shingles    UTI (urinary tract infection)    ecoli 09/2021   Past Surgical History:  Procedure Laterality Date   BREAST BIOPSY Right 01/28/2018   US guided biopsy - heart shaped   BREAST REDUCTION SURGERY Bilateral 04/03/2020   Procedure: MAMMARY REDUCTION  (BREAST);  Surgeon: Contogiannis, Audrea Muscat, MD;  Location: [place];  Service: Plastics;  Laterality: Bilateral;  HAVE LIPOSUCTION MACHINE AVAILABLE   CHOLECYSTECTOMY  2009   COLONOSCOPY WITH PROPOFOL N/A 07/19/2018   Procedure: COLONOSCOPY WITH PROPOFOL;  Surgeon: Lin Landsman, MD;  Location[facility] ENDOSCOPY;  Service: Gastroenterology;  Laterality: N/A;   COLONOSCOPY WITH PROPOFOL N/A 09/17/2021   Procedure: COLONOSCOPY WITH PROPOFOL;  Surgeon: Lin Landsman, MD;  Location: [facility] ENDOSCOPY;  Service: Gastroenterology;  Laterality: N/A;   COLONOSCOPY WITH PROPOFOL N/A 09/18/2021   Procedure: COLONOSCOPY WITH PROPOFOL;  Surgeon: Lin Landsman, MD;  Location: [facility] ENDOSCOPY;  Service: Gastroenterology;  Laterality: N/A;   ESOPHAGOGASTRODUODENOSCOPY (EGD) WITH PROPOFOL N/A  07/19/2018   Procedure: ESOPHAGOGASTRODUODENOSCOPY (EGD) WITH PROPOFOL;  Surgeon: Lin Landsman, MD;  Location: [facility] ENDOSCOPY;  Service: Gastroenterology;  Laterality: N/A;   GASTRIC BYPASS     2015; duodenal switch    LAPAROSCOPIC GASTRIC BANDING  2008   removed 2009   [place]   Patient Active Problem List   Diagnosis Date Noted   Normocytic anemia 06/13/2022   Elevated alkaline phosphatase level 05/28/2022   Chills 05/26/2022   Chronic left SI joint pain 11/13/2021   Piriformis syndrome, left 11/13/2021   Greater 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 11/13/2021   Primary osteoarthritis of right hip 09/16/2021   Plantar fasciitis 08/22/2021   Carpal tunnel syndrome on right 08/22/2021   Primary osteoarthritis of left hip 08/15/2021   Hyperphosphatemia 08/01/2021   Anemia in chronic kidney disease 04/18/2021   Chronic kidney disease, stage 3a ([place]) 04/18/2021   Hyperparathyroidism due to renal insufficiency ([place]) 04/18/2021   Hypertension 04/18/2021   Hiatal hernia 11/02/2020   History of migraine 11/02/2020   Gastroesophageal reflux disease 11/02/2020   Brain fog 11/02/2020   Depression, recurrent ([place]) 03/18/2020   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 09/08/2018   Arthropathy of right shoulder 09/08/2018   Controlled substance agreement signed 09/08/2018   Chronic musculoskeletal pain 08/24/2018   Routine physical examination 08/24/2018  Iron deficiency anemia 08/24/2018   Elevated liver enzymes 07/19/2018   Vitamin D deficiency 07/19/2018   Colon cancer screening    Abdominal pain, epigastric 07/08/2018   Right shoulder pain 06/15/2018   Hemorrhoids 05/01/2017   Anxiety and depression 11/24/2016   Back pain 09/11/2016   Large breasts 09/11/2016   Parotiditis 05/01/2016   Abnormal laboratory test result 03/26/2016   Proteinuria 03/26/2016   Right hip pain 04/18/2015   Acute sinusitis 01/08/2014   Psoriatic  arthritis (HCC) 09/13/2013   DUB (dysfunctional uterine bleeding) 10/27/2012   Gastritis due to nonsteroidal anti-inflammatory drug 12/12/2011   Anaphylactic reaction due to shellfish 09/27/2010   THYROMEGALY 09/03/2010   ALLERGIC RHINITIS 03/15/2010   MIGRAINE [name] 05/17/2008   PCP: Rennie Plowman FNP  REFERRING PROVIDER: [name] MD  REFERRING DIAG: [phone] (ICD-10-CM) - Primary osteoarthritis of left hip, M53.3,G89.29 (ICD-10-CM) - Chronic left SI joint pain, G57.02 (ICD-10-CM) - Piriformis syndrome, left, M25.552 (ICD-10-CM) - Greater 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  Rationale for Evaluation and Treatment: Rehabilitation  THERAPY DIAG: Pain in left hip  Muscle weakness (generalized)  ONSET DATE: 09/02/2015 (approximate)  FOLLOW-UP APPT SCHEDULED WITH REFERRING PROVIDER: Yes   FROM INITIAL EVALUATION SUBJECTIVE:                                                                                                                                                                                         SUBJECTIVE STATEMENT: L hip pain  PERTINENT HISTORY:  Pt reports a diagnosis of Lyme Disease in 2017 with persistent widespread chronic joint pain after treatment.  Of note she has severe L hip pain as well as pain in her L knee and right shoulder. Her L hip pain has been worsening recently. She denies any pain in her R hip or L shoulder. She was previously receiving L hip injections from Dr. Martha Clan however she reports that he refused to continue with further injections so she was referred by pain management to Dr. Ashley Royalty. She saw Dr. Ashley Royalty and pt received a L hip intraarticular injection as well as an injection in her L SIJ and L greater trochanter. The injections have helped with her pain. She was also referred to an orthopedic surgeon to discuss THR and referred for physical therapy. Pt expresses that she really enjoys  exercising but is very limited by her pain.   PAIN:  Pain Intensity: Present: 4/10, Best: 1/10, Worst: 10/10 Pain location: L groin, L greater trochanter, and L posterior hip Pain Quality: sharp  Radiating: Yes, down the posterior L thigh Numbness/Tingling: Yes, tingling in R hand Focal Weakness:  Yes, weakness related to pain Position of comfort: laying down (supine and R sidelying), occasionally pressure on L hip is helpful Aggravating factors: extended standing, extended sitting,  Relieving factors: exiting the bed on the right side, "I hate ice but it works," hot shower/bath helps, massage, stretches, deep squat, medication (takes at night for sleep),  24-hour pain behavior: worse at the end of the day How long can you sit: 10 minutes How long can you stand: 8-10 hours History of prior back injury, pain, surgery, or therapy: No Dominant hand: right Imaging: Yes, IMPRESSION: 1. 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 2. Moderate pubic symphysis osteoarthritis. Red flags: Negative for bowel/bladder changes, saddle paresthesia, personal history of cancer, h/o spinal tumors, h/o compression fx, h/o abdominal aneurysm, abdominal pain, chills/fever, night sweats, nausea, vomiting, unrelenting pain,   PRECAUTIONS: None  WEIGHT BEARING RESTRICTIONS: No  FALLS: Has patient fallen in last 6 months? No  Living Environment Lives with: lives with their spouse Lives in: House/apartment Stairs: Yes: Internal: 12 steps; on right going up Has following equipment at home: Single point cane, Walker - 2 wheeled, and Shower bench  Prior level of function: Independent  Occupational demands: works at [employer], standing for 8-10 hour shifts  Hobbies: Exercising (doesn't like water aerobics)  Patient Goals: Return to exercising.   OBJECTIVE:   Patient Surveys  FOTO 52, predicted improvement to 36  Cognition Patient is oriented to person, place, and  time.  Recent memory is intact.  Remote memory is intact.  Attention span and concentration are intact.  Expressive speech is intact.  Patient's fund of knowledge is within normal limits for educational level.    Gross Musculoskeletal Assessment Tremor: None Bulk: Normal Tone: Normal  GAIT: Antalgic gait on LLE but full gait assessment deferred  Posture: Lumbar lordosis: WNL Iliac crest height: Equal bilaterally Lumbar lateral shift: Negative  AROM AROM (Normal range in degrees) AROM   Lumbar   Flexion (65) WNL  Extension (30) Mild limitation with L hip pain during overpressure  Right lateral flexion (25) Mild limitation with L hip pain  Left lateral flexion (25) WNL with mild L hip pain  Right rotation (30)   Left rotation (30)       Hip Right Left  Flexion (125) WNL Limited around 90 degrees  Extension (15)    Abduction (40)    Adduction     Internal Rotation (45) To be measured To be measured  External Rotation (45) To be measured To be measured      Knee    Flexion (135) WNL WNL  Extension (0) 0 0      Ankle    Dorsiflexion (20)    Plantarflexion (50)    Inversion (35)    Eversion (15    (* = pain; Blank rows = not tested)  LE MMT: MMT (out of 5) Right  Left   Hip flexion 5 4+*  Hip extension    Hip abduction 4+ 4*  Hip adduction 4+ 4*  Hip internal rotation 5 5  Hip external rotation 5 4*  Knee flexion 4+ 4+*  Knee extension 5 5  Ankle dorsiflexion 5 5  Ankle plantarflexion    Ankle inversion    Ankle eversion    (* = pain; Blank rows = not tested)  Sensation Deferred  Reflexes Deferred   Muscle Length Hamstrings: R: Positive for shortening around 70 degrees L: Positive for shortening around 70 degrees [place] (quadriceps): R: Not  examined L: Not examined Thomas (hip flexors): R: Not examined L: Not examined Ober: R: Not examined L: Not examined  Palpation Location Right Left         Lumbar paraspinals 1 1  Quadratus Lumborum     Gluteus Maximus 1 2  Gluteus Medius 2 2  Deep hip external rotators 2 2  PSIS 1 2  Fortin's Area (SIJ) 0 0  Greater Trochanter 0 0  (Blank rows = not tested) Graded on 0-4 scale (0 = no pain, 1 = pain, 2 = pain with wincing/grimacing/flinching, 3 = pain with withdrawal, 4 = unwilling to allow palpation)  Passive Accessory Intervertebral Motion Generally hypomobile throughout lumbar spine with increased tenderness in lower lumbar segments  Special Tests  Lumbar Radiculopathy and Discogenic: Centralization and Peripheralization (SN 92, -LR 0.12): Not examined Slump (SN 83, -LR 0.32): R: Negative L: Negative SLR (SN 92, -LR 0.29): R: Negative L:  Negative Crossed SLR (SP 90): R: Negative L: Negative  Facet Joint: Extension-Rotation (SN 100, -LR 0.0): R: Negative L: Negative  Lumbar Foraminal Stenosis: Lumbar quadrant (SN 70): R: Negative L: Negative  Hip: FABER (SN 81): R: Negative L: Positive FADIR (SN 94): R: Negative L: Positive Hip scour (SN 50): R: Negative L: Negative  SIJ:  Thigh Thrust (SN 88, -LR 0.18) : R: Not examined L: Not examined  Piriformis Syndrome: FAIR Test (SN 88, SP 83): R: Not examined L: Not examined  Functional Tasks: Deferred  Beighton scale: Deferred   TODAY'S TREATMENT   SUBJECTIVE: Persistent L hip pain upon arrival. No significant change since the last therapy session. Pt took pain medication prior to therapy and reports approximately 3/10 pain. No specific questions or concerns.  PAIN: 3/10 L hip pain  Ther-ex  NuStep Level 0 x 5 minutes for warm-up during interval history (4 minutes unbilled); Hooklying lumbar rotation rocking within limited range of motion 2 x 60s; Hooklying low marches x 10 BLE; Light isometric clams 5s hold x 10; Light isometric adductor squeeze 5s hold x 10; Low bridges x 10;   Manual Therapy  Supine L hip AP mobilizations at neutral, grade I, 30s/bout x 3 bouts; Supine L hip long axis gentle distraction  with belt assist, 30s/bout x 3 bouts; Supine L hip gentle medial to lateral mobilizations with hip flexed to 45 degrees, belt assist, 30s/bout x 3 bouts; Supine L hip gentle medial to lateral/inferior mobilizations with hip flexed to 45 degrees and slightly externally rotated within comfortable range, belt assist, 30s/bout x 3 bouts; STM to L anterior and lateral hip with Theraband Roller for neural stimulation, tissue extensibility, and pain control;   PATIENT EDUCATION:  Education details: Pt educated throughout session about proper posture and technique with exercises. Improved exercise technique, movement at target joints, use of target muscles after min to mod verbal, visual, tactile cues, Therapeutic neuroscience pain education, HEP updates Person educated: Patient Education method: Explanation and handout Education comprehension: verbalized understanding   HOME EXERCISE PROGRAM:  Access Code: [email or web address] URL: [email or web address] Date: 07/17/2022 Prepared by: Ria Comment  Exercises - Hooklying Lumbar Rotation  - 1 x daily - 7 x weekly - 2 sets - 10 reps - 3s hold - Supine March  - 1 x daily - 7 x weekly - 2 sets - 10 reps - 3s hold - Supine Bridge  - 1 x daily - 7 x weekly - 2 sets - 10 reps - 3s hold   ASSESSMENT:  CLINICAL IMPRESSION: Continued light manual techniques  to L hip for pain modulation, neural stimulation, and tissue extensibility. Performed light strengthening during session today and progressed slightly. HEP updated and reviewed with patient. Pt reports that this is a difficult time of year for her due to personal loss and has noticed worsening of her pain during the holiday months. Encouraged pt to follow-up as scheduled. She will benefit from PT services to address deficits in strength, range of motion, and pain in order to improve pain with household and work responsibilities.   OBJECTIVE IMPAIRMENTS: Abnormal gait, decreased ROM, decreased  strength, and pain.   ACTIVITY LIMITATIONS: lifting, bending, standing, and squatting  PARTICIPATION LIMITATIONS: cleaning, shopping, community activity, and occupation  PERSONAL FACTORS: Past/current experiences, Time since onset of injury/illness/exacerbation, and 3+ comorbidities: depression, anxiety, migraines, chronic pain, and psoriatic arthritis  are also affecting patient's functional outcome.   REHAB POTENTIAL: Fair    CLINICAL DECISION MAKING: Unstable/unpredictable  EVALUATION COMPLEXITY: High   GOALS: Goals reviewed with patient? No  SHORT TERM GOALS: Target date: 08/05/2022  Pt will be independent with HEP in order to improve strength and range of motion as well as decrease hip pain to improve function at home and work. Baseline:  Goal status: INITIAL   LONG TERM GOALS: Target date: 09/02/2022  Pt will increase FOTO to at least 66 to demonstrate significant improvement in function at home and work related to L hip pain  Baseline: 07/08/22: 52 Goal status: INITIAL  2.  Pt will decrease worst hip pain by at least 2 points on the NPRS in order to demonstrate clinically significant reduction in hip pain. Baseline: 07/08/22: worst: 10/10 Goal status: INITIAL  3.  Pt will report at least 25% improvement in L hip symptoms in order to demonstrate clinically significant reduction in hip pain/disability so she can return to increased frequency of exercise with less pain       Baseline:  Goal status: INITIAL  4.  Pt will increase pain-free strength of L hip abduction/adduction by at least 1/2 MMT grade in order to demonstrate improvement in strength and function  Baseline: 07/08/22: 4/5 both with pain; Goal status: INITIAL   PLAN: PT FREQUENCY: 1-2x/week  PT DURATION: 8 weeks  PLANNED INTERVENTIONS: Therapeutic exercises, Therapeutic activity, Neuromuscular re-education, Balance training, Gait training, Patient/Family education, Self Care, Joint mobilization, Joint  manipulation, Vestibular training, Canalith repositioning, Orthotic/Fit training, DME instructions, Dry Needling, Electrical stimulation, Spinal manipulation, Spinal mobilization, Cryotherapy, Moist heat, Taping, Traction, Ultrasound, Ionotophoresis 4mg /ml Dexamethasone, Manual therapy, and Re-evaluation.  PLAN FOR NEXT SESSION: measure hip IR/ER, continue low level strengthening, therapeutic neuroscience education, review/modify HEP as needed   Lyndel Safe Kynadee Dam PT, DPT, GCS  Laina Guerrieri, PT 07/17/2022, 8:39 PM

## 2022-07-20 NOTE — Therapy (Incomplete)
OUTPATIENT PHYSICAL THERAPY HIP TREATMENT  Patient Name: Barbara Faulkner MRN: [id number] DOB:[birth year], 56 y.o., female Today's Date: 07/20/2022    Past Medical History:  Diagnosis Date   Allergy    Anxiety    Chronic kidney disease    Chronic pain    Chronic sinusitis    COVID-19    09/14/20, 06/13/21   Depression    Heart murmur    Hiatal hernia    HPV (human papilloma virus) anogenital infection    Macromastia    Migraine    Psoriatic arthritis (HCC)    Shingles    UTI (urinary tract infection)    ecoli 09/2021   Past Surgical History:  Procedure Laterality Date   BREAST BIOPSY Right 01/28/2018   US guided biopsy - heart shaped   BREAST REDUCTION SURGERY Bilateral 04/03/2020   Procedure: MAMMARY REDUCTION  (BREAST);  Surgeon: Contogiannis, Chales Abrahams, MD;  Location: [facility];  Service: Plastics;  Laterality: Bilateral;  HAVE LIPOSUCTION MACHINE AVAILABLE   CHOLECYSTECTOMY  2009   COLONOSCOPY WITH PROPOFOL N/A 07/19/2018   Procedure: COLONOSCOPY WITH PROPOFOL;  Surgeon: Toney Reil, MD;  Location: [facility] ENDOSCOPY;  Service: Gastroenterology;  Laterality: N/A;   COLONOSCOPY WITH PROPOFOL N/A 09/17/2021   Procedure: COLONOSCOPY WITH PROPOFOL;  Surgeon: Toney Reil, MD;  Location: [facility] ENDOSCOPY;  Service: Gastroenterology;  Laterality: N/A;   COLONOSCOPY WITH PROPOFOL N/A 09/18/2021   Procedure: COLONOSCOPY WITH PROPOFOL;  Surgeon: Toney Reil, MD;  Location: [facility] ENDOSCOPY;  Service: Gastroenterology;  Laterality: N/A;   ESOPHAGOGASTRODUODENOSCOPY (EGD) WITH PROPOFOL N/A 07/19/2018   Procedure: ESOPHAGOGASTRODUODENOSCOPY (EGD) WITH PROPOFOL;  Surgeon: Toney Reil, MD;  Location: [facility] ENDOSCOPY;  Service: Gastroenterology;  Laterality: N/A;   GASTRIC BYPASS     2015; duodenal switch    LAPAROSCOPIC GASTRIC BANDING  2008   removed 2009   REDUCTION MAMMAPLASTY     TUBAL LIGATION  1997   Patient Active Problem List    Diagnosis Date Noted   Normocytic anemia 06/13/2022   Elevated alkaline phosphatase level 05/28/2022   Chills 05/26/2022   Chronic left SI joint pain 11/13/2021   Piriformis syndrome, left 11/13/2021   Greater 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 11/13/2021   Primary osteoarthritis of right hip 09/16/2021   Plantar fasciitis 08/22/2021   Carpal tunnel syndrome on right 08/22/2021   Primary osteoarthritis of left hip 08/15/2021   Hyperphosphatemia 08/01/2021   Anemia in chronic kidney disease 04/18/2021   Chronic kidney disease, stage 3a (HCC) 04/18/2021   Hyperparathyroidism due to renal insufficiency (HCC) 04/18/2021   Hypertension 04/18/2021   Hiatal hernia 11/02/2020   History of migraine 11/02/2020   Gastroesophageal reflux disease 11/02/2020   Brain fog 11/02/2020   Depression, recurrent (HCC) 03/18/2020   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 09/08/2018   Arthropathy of right shoulder 09/08/2018   Controlled substance agreement signed 09/08/2018   Chronic musculoskeletal pain 08/24/2018   Routine physical examination 08/24/2018   Iron deficiency anemia 08/24/2018   Elevated liver enzymes 07/19/2018   Vitamin D deficiency 07/19/2018   Colon cancer screening    Abdominal pain, epigastric 07/08/2018   Right shoulder pain 06/15/2018   Hemorrhoids 05/01/2017   Anxiety and depression 11/24/2016   Back pain 09/11/2016   Large breasts 09/11/2016   Parotiditis 05/01/2016   Abnormal laboratory test result 03/26/2016   Proteinuria 03/26/2016   Right hip pain 04/18/2015   Acute sinusitis 01/08/2014   Psoriatic arthritis (HCC) 09/13/2013  DUB (dysfunctional uterine bleeding) 10/27/2012   Gastritis due to nonsteroidal anti-inflammatory drug 12/12/2011   Anaphylactic reaction due to shellfish 09/27/2010   THYROMEGALY 09/03/2010   ALLERGIC RHINITIS 03/15/2010   MIGRAINE Elio Forget W/O INTRACT W/O STATUS MIGRNOSUS 05/17/2008   PCP: [name] FNP  REFERRING PROVIDER:  Rosette Reveal MD  REFERRING DIAG: [phone] (ICD-10-CM) - Primary osteoarthritis of left hip, M53.3,G89.29 (ICD-10-CM) - Chronic left SI joint pain, G57.02 (ICD-10-CM) - Piriformis syndrome, left, M25.552 (ICD-10-CM) - Greater 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  Rationale for Evaluation and Treatment: Rehabilitation  THERAPY DIAG: Pain in left hip  Muscle weakness (generalized)  ONSET DATE: 09/02/2015 (approximate)  FOLLOW-UP APPT SCHEDULED WITH REFERRING PROVIDER: Yes   FROM INITIAL EVALUATION SUBJECTIVE:                                                                                                                                                                                         SUBJECTIVE STATEMENT: L hip pain  PERTINENT HISTORY:  Pt reports a diagnosis of Lyme Disease in 2017 with persistent widespread chronic joint pain after treatment.  Of note she has severe L hip pain as well as pain in her L knee and right shoulder. Her L hip pain has been worsening recently. She denies any pain in her R hip or L shoulder. She was previously receiving L hip injections from Dr. Mack Guise however she reports that he refused to continue with further injections so she was referred by pain management to Dr. Zigmund Daniel. She saw Dr. Zigmund Daniel and pt received a L hip intraarticular injection as well as an injection in her L SIJ and L greater trochanter. The injections have helped with her pain. She was also referred to an orthopedic surgeon to discuss THR and referred for physical therapy. Pt expresses that she really enjoys exercising but is very limited by her pain.   PAIN:  Pain Intensity: Present: 4/10, Best: 1/10, Worst: 10/10 Pain location: L groin, L greater trochanter, and L posterior hip Pain Quality: sharp  Radiating: Yes, down the posterior L thigh Numbness/Tingling: Yes, tingling in R hand Focal Weakness: Yes, weakness related to pain Position of comfort: laying down (supine  and R sidelying), occasionally pressure on L hip is helpful Aggravating factors: extended standing, extended sitting,  Relieving factors: exiting the bed on the right side, "I hate ice but it works," hot shower/bath helps, massage, stretches, deep squat, medication (takes at night for sleep),  24-hour pain behavior: worse at the end of the day How long can you sit: 10 minutes How long can you stand: 8-10 hours History of prior back injury, pain, surgery,  or therapy: No Dominant hand: right Imaging: Yes, IMPRESSION: 1. 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 2. Moderate pubic symphysis osteoarthritis. Red flags: Negative for bowel/bladder changes, saddle paresthesia, personal history of cancer, h/o spinal tumors, h/o compression fx, h/o abdominal aneurysm, abdominal pain, chills/fever, night sweats, nausea, vomiting, unrelenting pain,   PRECAUTIONS: None  WEIGHT BEARING RESTRICTIONS: No  FALLS: Has patient fallen in last 6 months? No  Living Environment Lives with: lives with their spouse Lives in: House/apartment Stairs: Yes: Internal: 12 steps; on right going up Has following equipment at home: Single point cane, Walker - 2 wheeled, and Shower bench  Prior level of function: Independent  Occupational demands: works at [employer], standing for 8-10 hour shifts  Hobbies: Exercising (doesn't like water aerobics)  Patient Goals: Return to exercising.   OBJECTIVE:   Patient Surveys  FOTO 52, predicted improvement to 32  Cognition Patient is oriented to person, place, and time.  Recent memory is intact.  Remote memory is intact.  Attention span and concentration are intact.  Expressive speech is intact.  Patient's fund of knowledge is within normal limits for educational level.    Gross Musculoskeletal Assessment Tremor: None Bulk: Normal Tone: Normal  GAIT: Antalgic gait on LLE but full gait assessment  deferred  Posture: Lumbar lordosis: WNL Iliac crest height: Equal bilaterally Lumbar lateral shift: Negative  AROM AROM (Normal range in degrees) AROM   Lumbar   Flexion (65) WNL  Extension (30) Mild limitation with L hip pain during overpressure  Right lateral flexion (25) Mild limitation with L hip pain  Left lateral flexion (25) WNL with mild L hip pain  Right rotation (30)   Left rotation (30)       Hip Right Left  Flexion (125) WNL Limited around 90 degrees  Extension (15)    Abduction (40)    Adduction     Internal Rotation (45) To be measured To be measured  External Rotation (45) To be measured To be measured      Knee    Flexion (135) WNL WNL  Extension (0) 0 0      Ankle    Dorsiflexion (20)    Plantarflexion (50)    Inversion (35)    Eversion (15    (* = pain; Blank rows = not tested)  LE MMT: MMT (out of 5) Right  Left   Hip flexion 5 4+*  Hip extension    Hip abduction 4+ 4*  Hip adduction 4+ 4*  Hip internal rotation 5 5  Hip external rotation 5 4*  Knee flexion 4+ 4+*  Knee extension 5 5  Ankle dorsiflexion 5 5  Ankle plantarflexion    Ankle inversion    Ankle eversion    (* = pain; Blank rows = not tested)  Sensation Deferred  Reflexes Deferred   Muscle Length Hamstrings: R: Positive for shortening around 70 degrees L: Positive for shortening around 70 degrees Ely (quadriceps): R: Not examined L: Not examined Thomas (hip flexors): R: Not examined L: Not examined Ober: R: Not examined L: Not examined  Palpation Location Right Left         Lumbar paraspinals 1 1  Quadratus Lumborum    Gluteus Maximus 1 2  Gluteus Medius 2 2  Deep hip external rotators 2 2  PSIS 1 2  Fortin's Area (SIJ) 0 0  Greater Trochanter 0 0  (Blank rows = not tested) Graded on 0-4 scale (0 = no pain,  1 = pain, 2 = pain with wincing/grimacing/flinching, 3 = pain with withdrawal, 4 = unwilling to allow palpation)  Passive Accessory Intervertebral  Motion Generally hypomobile throughout lumbar spine with increased tenderness in lower lumbar segments  Special Tests  Lumbar Radiculopathy and Discogenic: Centralization and Peripheralization (SN 92, -LR 0.12): Not examined Slump (SN 83, -LR 0.32): R: Negative L: Negative SLR (SN 92, -LR 0.29): R: Negative L:  Negative Crossed SLR (SP 90): R: Negative L: Negative  Facet Joint: Extension-Rotation (SN 100, -LR 0.0): R: Negative L: Negative  Lumbar Foraminal Stenosis: Lumbar quadrant (SN 70): R: Negative L: Negative  Hip: FABER (SN 81): R: Negative L: Positive FADIR (SN 94): R: Negative L: Positive Hip scour (SN 50): R: Negative L: Negative  SIJ:  Thigh Thrust (SN 88, -LR 0.18) : R: Not examined L: Not examined  Piriformis Syndrome: FAIR Test (SN 88, SP 83): R: Not examined L: Not examined  Functional Tasks: Deferred  Beighton scale: Deferred   TODAY'S TREATMENT   SUBJECTIVE: Persistent L hip pain upon arrival. No significant change since the last therapy session. Pt took pain medication prior to therapy and reports approximately 3/10 pain. No specific questions or concerns.  PAIN: 3/10 L hip pain  Ther-ex  NuStep Level 0 x 5 minutes for warm-up during interval history (4 minutes unbilled); Hooklying lumbar rotation rocking within limited range of motion 2 x 60s; Hooklying low marches x 10 BLE; Light isometric clams 5s hold x 10; Light isometric adductor squeeze 5s hold x 10; Low bridges x 10;   Manual Therapy  Supine L hip AP mobilizations at neutral, grade I, 30s/bout x 3 bouts; Supine L hip long axis gentle distraction with belt assist, 30s/bout x 3 bouts; Supine L hip gentle medial to lateral mobilizations with hip flexed to 45 degrees, belt assist, 30s/bout x 3 bouts; Supine L hip gentle medial to lateral/inferior mobilizations with hip flexed to 45 degrees and slightly externally rotated within comfortable range, belt assist, 30s/bout x 3 bouts; STM to L  anterior and lateral hip with Theraband Roller for neural stimulation, tissue extensibility, and pain control;   PATIENT EDUCATION:  Education details: Pt educated throughout session about proper posture and technique with exercises. Improved exercise technique, movement at target joints, use of target muscles after min to mod verbal, visual, tactile cues, Therapeutic neuroscience pain education, HEP updates Person educated: Patient Education method: Explanation and handout Education comprehension: verbalized understanding   HOME EXERCISE PROGRAM:  Access Code: [email or web address] URL: [email or web address] Date: 07/17/2022 Prepared by: Ria Comment  Exercises - Hooklying Lumbar Rotation  - 1 x daily - 7 x weekly - 2 sets - 10 reps - 3s hold - Supine March  - 1 x daily - 7 x weekly - 2 sets - 10 reps - 3s hold - Supine Bridge  - 1 x daily - 7 x weekly - 2 sets - 10 reps - 3s hold   ASSESSMENT:  CLINICAL IMPRESSION: Continued light manual techniques to L hip for pain modulation, neural stimulation, and tissue extensibility. Performed light strengthening during session today and progressed slightly. HEP updated and reviewed with patient. Pt reports that this is a difficult time of year for her due to personal loss and has noticed worsening of her pain during the holiday months. Encouraged pt to follow-up as scheduled. She will benefit from PT services to address deficits in strength, range of motion, and pain in order to improve pain with household and work responsibilities.   OBJECTIVE  IMPAIRMENTS: Abnormal gait, decreased ROM, decreased strength, and pain.   ACTIVITY LIMITATIONS: lifting, bending, standing, and squatting  PARTICIPATION LIMITATIONS: cleaning, shopping, community activity, and occupation  PERSONAL FACTORS: Past/current experiences, Time since onset of injury/illness/exacerbation, and 3+ comorbidities: depression, anxiety, migraines, chronic pain, and psoriatic  arthritis  are also affecting patient's functional outcome.   REHAB POTENTIAL: Fair    CLINICAL DECISION MAKING: Unstable/unpredictable  EVALUATION COMPLEXITY: High   GOALS: Goals reviewed with patient? No  SHORT TERM GOALS: Target date: 08/05/2022  Pt will be independent with HEP in order to improve strength and range of motion as well as decrease hip pain to improve function at home and work. Baseline:  Goal status: INITIAL   LONG TERM GOALS: Target date: 09/02/2022  Pt will increase FOTO to at least 66 to demonstrate significant improvement in function at home and work related to L hip pain  Baseline: 07/08/22: 52 Goal status: INITIAL  2.  Pt will decrease worst hip pain by at least 2 points on the NPRS in order to demonstrate clinically significant reduction in hip pain. Baseline: 07/08/22: worst: 10/10 Goal status: INITIAL  3.  Pt will report at least 25% improvement in L hip symptoms in order to demonstrate clinically significant reduction in hip pain/disability so she can return to increased frequency of exercise with less pain       Baseline:  Goal status: INITIAL  4.  Pt will increase pain-free strength of L hip abduction/adduction by at least 1/2 MMT grade in order to demonstrate improvement in strength and function  Baseline: 07/08/22: 4/5 both with pain; Goal status: INITIAL   PLAN: PT FREQUENCY: 1-2x/week  PT DURATION: 8 weeks  PLANNED INTERVENTIONS: Therapeutic exercises, Therapeutic activity, Neuromuscular re-education, Balance training, Gait training, Patient/Family education, Self Care, Joint mobilization, Joint manipulation, Vestibular training, Canalith repositioning, Orthotic/Fit training, DME instructions, Dry Needling, Electrical stimulation, Spinal manipulation, Spinal mobilization, Cryotherapy, Moist heat, Taping, Traction, Ultrasound, Ionotophoresis 4mg /ml Dexamethasone, Manual therapy, and Re-evaluation.  PLAN FOR NEXT SESSION: measure hip IR/ER,  continue low level strengthening, therapeutic neuroscience education, review/modify HEP as needed   Manpreet Strey PT, DPT, GCS  Marquis Diles, PT 07/20/2022, 9:16 PM

## 2022-07-22 ENCOUNTER — Ambulatory Visit: Payer: Federal, State, Local not specified - PPO

## 2022-07-22 DIAGNOSIS — M25552 Pain in left hip: Secondary | ICD-10-CM

## 2022-07-22 DIAGNOSIS — M6281 Muscle weakness (generalized): Secondary | ICD-10-CM

## 2022-07-29 ENCOUNTER — Ambulatory Visit: Payer: Federal, State, Local not specified - PPO

## 2022-07-29 DIAGNOSIS — M25552 Pain in left hip: Secondary | ICD-10-CM

## 2022-07-29 DIAGNOSIS — M1612 Unilateral primary osteoarthritis, left hip: Secondary | ICD-10-CM | POA: Diagnosis not present

## 2022-07-29 DIAGNOSIS — G5702 Lesion of sciatic nerve, left lower limb: Secondary | ICD-10-CM | POA: Diagnosis not present

## 2022-07-29 DIAGNOSIS — M533 Sacrococcygeal disorders, not elsewhere classified: Secondary | ICD-10-CM | POA: Diagnosis not present

## 2022-07-29 DIAGNOSIS — M6281 Muscle weakness (generalized): Secondary | ICD-10-CM

## 2022-07-29 DIAGNOSIS — G8929 Other chronic pain: Secondary | ICD-10-CM | POA: Diagnosis not present

## 2022-07-29 NOTE — Therapy (Signed)
OUTPATIENT PHYSICAL THERAPY HIP TREATMENT  Patient Name: Barbara Faulkner MRN: [id number] DOB:1966-03-08, 56 y.o., female Today's Date: 07/29/2022   PT End of Session - 07/29/22 1443     Visit Number 4    Number of Visits 17    Date for PT Re-Evaluation 09/02/22    Authorization Type eval: 07/08/22    PT Start Time 1445    PT Stop Time 1530    PT Time Calculation (min) 45 min    Activity Tolerance Patient tolerated treatment well    Behavior During Therapy [facility] for tasks assessed/performed            Past Medical History:  Diagnosis Date   Allergy    Anxiety    Chronic kidney disease    Chronic pain    Chronic sinusitis    COVID-19    09/14/20, 06/13/21   Depression    Heart murmur    Hiatal hernia    HPV (human papilloma virus) anogenital infection    Macromastia    Migraine    Psoriatic arthritis (HCC)    Shingles    UTI (urinary tract infection)    ecoli 09/2021   Past Surgical History:  Procedure Laterality Date   BREAST BIOPSY Right 01/28/2018   US guided biopsy - heart shaped   BREAST REDUCTION SURGERY Bilateral 04/03/2020   Procedure: MAMMARY REDUCTION  (BREAST);  Surgeon: Contogiannis, Chales Abrahams, MD;  Location: [facility];  Service: Plastics;  Laterality: Bilateral;  HAVE LIPOSUCTION MACHINE AVAILABLE   CHOLECYSTECTOMY  2009   COLONOSCOPY WITH PROPOFOL N/A 07/19/2018   Procedure: COLONOSCOPY WITH PROPOFOL;  Surgeon: Toney Reil, MD;  Location: [facility] ENDOSCOPY;  Service: Gastroenterology;  Laterality: N/A;   COLONOSCOPY WITH PROPOFOL N/A 09/17/2021   Procedure: COLONOSCOPY WITH PROPOFOL;  Surgeon: Toney Reil, MD;  Location: [facility] ENDOSCOPY;  Service: Gastroenterology;  Laterality: N/A;   COLONOSCOPY WITH PROPOFOL N/A 09/18/2021   Procedure: COLONOSCOPY WITH PROPOFOL;  Surgeon: Toney Reil, MD;  Location: [facility] ENDOSCOPY;  Service: Gastroenterology;  Laterality: N/A;   ESOPHAGOGASTRODUODENOSCOPY (EGD) WITH PROPOFOL N/A  07/19/2018   Procedure: ESOPHAGOGASTRODUODENOSCOPY (EGD) WITH PROPOFOL;  Surgeon: Toney Reil, MD;  Location: [facility] ENDOSCOPY;  Service: Gastroenterology;  Laterality: N/A;   GASTRIC BYPASS     2015; duodenal switch    LAPAROSCOPIC GASTRIC BANDING  2008   removed 2009   REDUCTION MAMMAPLASTY     TUBAL LIGATION  1997   Patient Active Problem List   Diagnosis Date Noted   Normocytic anemia 06/13/2022   Elevated alkaline phosphatase level 05/28/2022   Chills 05/26/2022   Chronic left SI joint pain 11/13/2021   Piriformis syndrome, left 11/13/2021   Greater 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 11/13/2021   Primary osteoarthritis of right hip 09/16/2021   Plantar fasciitis 08/22/2021   Carpal tunnel syndrome on right 08/22/2021   Primary osteoarthritis of left hip 08/15/2021   Hyperphosphatemia 08/01/2021   Anemia in chronic kidney disease 04/18/2021   Chronic kidney disease, stage 3a (HCC) 04/18/2021   Hyperparathyroidism due to renal insufficiency (HCC) 04/18/2021   Hypertension 04/18/2021   Hiatal hernia 11/02/2020   History of migraine 11/02/2020   Gastroesophageal reflux disease 11/02/2020   Brain fog 11/02/2020   Depression, recurrent (HCC) 03/18/2020   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 09/08/2018   Arthropathy of right shoulder 09/08/2018   Controlled substance agreement signed 09/08/2018   Chronic musculoskeletal pain 08/24/2018   Routine physical examination 08/24/2018  Iron deficiency anemia 08/24/2018   Elevated liver enzymes 07/19/2018   Vitamin D deficiency 07/19/2018   Colon cancer screening    Abdominal pain, epigastric 07/08/2018   Right shoulder pain 06/15/2018   Hemorrhoids 05/01/2017   Anxiety and depression 11/24/2016   Back pain 09/11/2016   Large breasts 09/11/2016   Parotiditis 05/01/2016   Abnormal laboratory test result 03/26/2016   Proteinuria 03/26/2016   Right hip pain 04/18/2015   Acute sinusitis 01/08/2014   Psoriatic  arthritis (HCC) 09/13/2013   DUB (dysfunctional uterine bleeding) 10/27/2012   Gastritis due to nonsteroidal anti-inflammatory drug 12/12/2011   Anaphylactic reaction due to shellfish 09/27/2010   THYROMEGALY 09/03/2010   ALLERGIC RHINITIS 03/15/2010   MIGRAINE [name] 05/17/2008   PCP: Rennie Plowman FNP  REFERRING PROVIDER: [name] MD  REFERRING DIAG: [phone] (ICD-10-CM) - Primary osteoarthritis of left hip, M53.3,G89.29 (ICD-10-CM) - Chronic left SI joint pain, G57.02 (ICD-10-CM) - Piriformis syndrome, left, M25.552 (ICD-10-CM) - Greater 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  Rationale for Evaluation and Treatment: Rehabilitation  THERAPY DIAG: Pain in left hip  Muscle weakness (generalized)  ONSET DATE: 09/02/2015 (approximate)  FOLLOW-UP APPT SCHEDULED WITH REFERRING PROVIDER: Yes   FROM INITIAL EVALUATION SUBJECTIVE:                                                                                                                                                                                         SUBJECTIVE STATEMENT: L hip pain  PERTINENT HISTORY:  Pt reports a diagnosis of Lyme Disease in 2017 with persistent widespread chronic joint pain after treatment.  Of note she has severe L hip pain as well as pain in her L knee and right shoulder. Her L hip pain has been worsening recently. She denies any pain in her R hip or L shoulder. She was previously receiving L hip injections from Dr. Martha Clan however she reports that he refused to continue with further injections so she was referred by pain management to Dr. Ashley Royalty. She saw Dr. Ashley Royalty and pt received a L hip intraarticular injection as well as an injection in her L SIJ and L greater trochanter. The injections have helped with her pain. She was also referred to an orthopedic surgeon to discuss THR and referred for physical therapy. Pt expresses that she really enjoys  exercising but is very limited by her pain.   PAIN:  Pain Intensity: Present: 4/10, Best: 1/10, Worst: 10/10 Pain location: L groin, L greater trochanter, and L posterior hip Pain Quality: sharp  Radiating: Yes, down the posterior L thigh Numbness/Tingling: Yes, tingling in R hand Focal Weakness:  Yes, weakness related to pain Position of comfort: laying down (supine and R sidelying), occasionally pressure on L hip is helpful Aggravating factors: extended standing, extended sitting,  Relieving factors: exiting the bed on the right side, "I hate ice but it works," hot shower/bath helps, massage, stretches, deep squat, medication (takes at night for sleep),  24-hour pain behavior: worse at the end of the day How long can you sit: 10 minutes How long can you stand: 8-10 hours History of prior back injury, pain, surgery, or therapy: No Dominant hand: right Imaging: Yes, IMPRESSION: 1. 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 2. Moderate pubic symphysis osteoarthritis. Red flags: Negative for bowel/bladder changes, saddle paresthesia, personal history of cancer, h/o spinal tumors, h/o compression fx, h/o abdominal aneurysm, abdominal pain, chills/fever, night sweats, nausea, vomiting, unrelenting pain,   PRECAUTIONS: None  WEIGHT BEARING RESTRICTIONS: No  FALLS: Has patient fallen in last 6 months? No  Living Environment Lives with: lives with their spouse Lives in: House/apartment Stairs: Yes: Internal: 12 steps; on right going up Has following equipment at home: Single point cane, Walker - 2 wheeled, and Shower bench  Prior level of function: Independent  Occupational demands: works at [employer], standing for 8-10 hour shifts  Hobbies: Exercising (doesn't like water aerobics)  Patient Goals: Return to exercising.   OBJECTIVE:   Patient Surveys  FOTO 52, predicted improvement to 36  Cognition Patient is oriented to person, place, and  time.  Recent memory is intact.  Remote memory is intact.  Attention span and concentration are intact.  Expressive speech is intact.  Patient's fund of knowledge is within normal limits for educational level.    Gross Musculoskeletal Assessment Tremor: None Bulk: Normal Tone: Normal  GAIT: Antalgic gait on LLE but full gait assessment deferred  Posture: Lumbar lordosis: WNL Iliac crest height: Equal bilaterally Lumbar lateral shift: Negative  AROM AROM (Normal range in degrees) AROM   Lumbar   Flexion (65) WNL  Extension (30) Mild limitation with L hip pain during overpressure  Right lateral flexion (25) Mild limitation with L hip pain  Left lateral flexion (25) WNL with mild L hip pain  Right rotation (30)   Left rotation (30)       Hip Right Left  Flexion (125) WNL Limited around 90 degrees  Extension (15)    Abduction (40)    Adduction     Internal Rotation (45) To be measured To be measured  External Rotation (45) To be measured To be measured      Knee    Flexion (135) WNL WNL  Extension (0) 0 0      Ankle    Dorsiflexion (20)    Plantarflexion (50)    Inversion (35)    Eversion (15    (* = pain; Blank rows = not tested)  LE MMT: MMT (out of 5) Right  Left   Hip flexion 5 4+*  Hip extension    Hip abduction 4+ 4*  Hip adduction 4+ 4*  Hip internal rotation 5 5  Hip external rotation 5 4*  Knee flexion 4+ 4+*  Knee extension 5 5  Ankle dorsiflexion 5 5  Ankle plantarflexion    Ankle inversion    Ankle eversion    (* = pain; Blank rows = not tested)  Sensation Deferred  Reflexes Deferred   Muscle Length Hamstrings: R: Positive for shortening around 70 degrees L: Positive for shortening around 70 degrees [place] (quadriceps): R: Not  examined L: Not examined Thomas (hip flexors): R: Not examined L: Not examined Ober: R: Not examined L: Not examined  Palpation Location Right Left         Lumbar paraspinals 1 1  Quadratus Lumborum     Gluteus Maximus 1 2  Gluteus Medius 2 2  Deep hip external rotators 2 2  PSIS 1 2  Fortin's Area (SIJ) 0 0  Greater Trochanter 0 0  (Blank rows = not tested) Graded on 0-4 scale (0 = no pain, 1 = pain, 2 = pain with wincing/grimacing/flinching, 3 = pain with withdrawal, 4 = unwilling to allow palpation)  Passive Accessory Intervertebral Motion Generally hypomobile throughout lumbar spine with increased tenderness in lower lumbar segments  Special Tests  Lumbar Radiculopathy and Discogenic: Centralization and Peripheralization (SN 92, -LR 0.12): Not examined Slump (SN 83, -LR 0.32): R: Negative L: Negative SLR (SN 92, -LR 0.29): R: Negative L:  Negative Crossed SLR (SP 90): R: Negative L: Negative  Facet Joint: Extension-Rotation (SN 100, -LR 0.0): R: Negative L: Negative  Lumbar Foraminal Stenosis: Lumbar quadrant (SN 70): R: Negative L: Negative  Hip: FABER (SN 81): R: Negative L: Positive FADIR (SN 94): R: Negative L: Positive Hip scour (SN 50): R: Negative L: Negative  SIJ:  Thigh Thrust (SN 88, -LR 0.18) : R: Not examined L: Not examined  Piriformis Syndrome: FAIR Test (SN 88, SP 83): R: Not examined L: Not examined  Functional Tasks: Deferred  Beighton scale: Deferred   TODAY'S TREATMENT   SUBJECTIVE: Pt reports that she has had a lot of L hip pain recently. She had to take two oxycodone and a tramadol prior to therapy today. No specific questions currently.   PAIN: L hip pain   Ther-ex  NuStep Level 0-1 x 5 minutes for warm-up during interval history (4 minutes unbilled); Hooklying lumbar rotation rocking within limited range of motion 2 x 60s; Hooklying low marches x 10 BLE; Isometric clams 5s hold x 10; Isometric adductor squeeze 5s hold x 10; Bridges x 10; Supine straight knee isometric L hip abduction 5s hold x 10; Supine straight knee isometric L hip adduction 5s hold x 10;   Manual Therapy  Supine L hip AP mobilizations at neutral, grade I,  30s/bout x 3 bouts; Supine L hip long axis gentle distraction with belt assist, 30s/bout x 3 bouts; Supine L hip inferior mobilizations with belt assist and hip flexed to 90 degrees 30s/bout x 3 bouts; Supine L hip gentle medial to lateral mobilizations with hip flexed to 45 degrees, belt assist, 30s/bout x 3 bouts; Supine L hip gentle medial to lateral/inferior mobilizations with hip flexed to 45 degrees and slightly externally rotated within comfortable range, belt assist, 30s/bout x 3 bouts; STM to L anterior and lateral hip with Theraband Roller for neural stimulation, tissue extensibility, and pain control;   PATIENT EDUCATION:  Education details: Pt educated throughout session about proper posture and technique with exercises. Improved exercise technique, movement at target joints, use of target muscles after min to mod verbal, visual, tactile cues, Therapeutic neuroscience pain education, HEP updates Person educated: Patient Education method: Explanation and handout Education comprehension: verbalized understanding   HOME EXERCISE PROGRAM:  Access Code: [email or web address] URL: [email or web address] Date: 07/17/2022 Prepared by: Ria Comment  Exercises - Hooklying Lumbar Rotation  - 1 x daily - 7 x weekly - 2 sets - 10 reps - 3s hold - Supine March  - 1 x daily - 7 x weekly - 2 sets -  10 reps - 3s hold - Supine Bridge  - 1 x daily - 7 x weekly - 2 sets - 10 reps - 3s hold   ASSESSMENT:  CLINICAL IMPRESSION: Continued light manual techniques to L hip for pain modulation, neural stimulation, and tissue extensibility. Performed light strengthening during session today and progressed slightly again today. No HEP modifications on this date but will consider at next therapy appointment. Encouraged pt to follow-up as scheduled. She will benefit from PT services to address deficits in strength, range of motion, and pain in order to improve pain with household and work  responsibilities.   OBJECTIVE IMPAIRMENTS: Abnormal gait, decreased ROM, decreased strength, and pain.   ACTIVITY LIMITATIONS: lifting, bending, standing, and squatting  PARTICIPATION LIMITATIONS: cleaning, shopping, community activity, and occupation  PERSONAL FACTORS: Past/current experiences, Time since onset of injury/illness/exacerbation, and 3+ comorbidities: depression, anxiety, migraines, chronic pain, and psoriatic arthritis  are also affecting patient's functional outcome.   REHAB POTENTIAL: Fair    CLINICAL DECISION MAKING: Unstable/unpredictable  EVALUATION COMPLEXITY: High   GOALS: Goals reviewed with patient? No  SHORT TERM GOALS: Target date: 08/05/2022  Pt will be independent with HEP in order to improve strength and range of motion as well as decrease hip pain to improve function at home and work. Baseline:  Goal status: INITIAL   LONG TERM GOALS: Target date: 09/02/2022  Pt will increase FOTO to at least 66 to demonstrate significant improvement in function at home and work related to L hip pain  Baseline: 07/08/22: 52 Goal status: INITIAL  2.  Pt will decrease worst hip pain by at least 2 points on the NPRS in order to demonstrate clinically significant reduction in hip pain. Baseline: 07/08/22: worst: 10/10 Goal status: INITIAL  3.  Pt will report at least 25% improvement in L hip symptoms in order to demonstrate clinically significant reduction in hip pain/disability so she can return to increased frequency of exercise with less pain       Baseline:  Goal status: INITIAL  4.  Pt will increase pain-free strength of L hip abduction/adduction by at least 1/2 MMT grade in order to demonstrate improvement in strength and function  Baseline: 07/08/22: 4/5 both with pain; Goal status: INITIAL   PLAN: PT FREQUENCY: 1-2x/week  PT DURATION: 8 weeks  PLANNED INTERVENTIONS: Therapeutic exercises, Therapeutic activity, Neuromuscular re-education, Balance  training, Gait training, Patient/Family education, Self Care, Joint mobilization, Joint manipulation, Vestibular training, Canalith repositioning, Orthotic/Fit training, DME instructions, Dry Needling, Electrical stimulation, Spinal manipulation, Spinal mobilization, Cryotherapy, Moist heat, Taping, Traction, Ultrasound, Ionotophoresis 4mg /ml Dexamethasone, Manual therapy, and Re-evaluation.  PLAN FOR NEXT SESSION: continue low level strengthening, therapeutic neuroscience education, review/modify HEP as needed   Sharalyn InkJason D Octavia Mottola PT, DPT, GCS  Antionetta Ator, PT 07/29/2022, 10:46 PM

## 2022-07-31 ENCOUNTER — Ambulatory Visit: Payer: Federal, State, Local not specified - PPO

## 2022-07-31 DIAGNOSIS — M25552 Pain in left hip: Secondary | ICD-10-CM

## 2022-07-31 DIAGNOSIS — M6281 Muscle weakness (generalized): Secondary | ICD-10-CM | POA: Diagnosis not present

## 2022-07-31 DIAGNOSIS — M1612 Unilateral primary osteoarthritis, left hip: Secondary | ICD-10-CM | POA: Diagnosis not present

## 2022-07-31 DIAGNOSIS — G8929 Other chronic pain: Secondary | ICD-10-CM | POA: Diagnosis not present

## 2022-07-31 DIAGNOSIS — G5702 Lesion of sciatic nerve, left lower limb: Secondary | ICD-10-CM | POA: Diagnosis not present

## 2022-07-31 DIAGNOSIS — M533 Sacrococcygeal disorders, not elsewhere classified: Secondary | ICD-10-CM | POA: Diagnosis not present

## 2022-07-31 NOTE — Therapy (Signed)
OUTPATIENT PHYSICAL THERAPY HIP TREATMENT  Patient Name: Barbara Faulkner MRN: [id number] DOB:06/12/1966, 56 y.o., female Today's Date: 07/31/2022   PT End of Session - 07/31/22 1459     Visit Number 5    Number of Visits 17    Date for PT Re-Evaluation 09/02/22    Authorization Type eval: 07/08/22    PT Start Time 1447    PT Stop Time 1530    PT Time Calculation (min) 43 min    Activity Tolerance Patient tolerated treatment well    Behavior During Therapy [facility] for tasks assessed/performed            Past Medical History:  Diagnosis Date   Allergy    Anxiety    Chronic kidney disease    Chronic pain    Chronic sinusitis    COVID-19    09/14/20, 06/13/21   Depression    Heart murmur    Hiatal hernia    HPV (human papilloma virus) anogenital infection    Macromastia    Migraine    Psoriatic arthritis (HCC)    Shingles    UTI (urinary tract infection)    ecoli 09/2021   Past Surgical History:  Procedure Laterality Date   BREAST BIOPSY Right 01/28/2018   US guided biopsy - heart shaped   BREAST REDUCTION SURGERY Bilateral 04/03/2020   Procedure: MAMMARY REDUCTION  (BREAST);  Surgeon: Contogiannis, Chales Abrahams, MD;  Location: [facility];  Service: Plastics;  Laterality: Bilateral;  HAVE LIPOSUCTION MACHINE AVAILABLE   CHOLECYSTECTOMY  2009   COLONOSCOPY WITH PROPOFOL N/A 07/19/2018   Procedure: COLONOSCOPY WITH PROPOFOL;  Surgeon: Toney Reil, MD;  Location: [facility] ENDOSCOPY;  Service: Gastroenterology;  Laterality: N/A;   COLONOSCOPY WITH PROPOFOL N/A 09/17/2021   Procedure: COLONOSCOPY WITH PROPOFOL;  Surgeon: Toney Reil, MD;  Location: [facility] ENDOSCOPY;  Service: Gastroenterology;  Laterality: N/A;   COLONOSCOPY WITH PROPOFOL N/A 09/18/2021   Procedure: COLONOSCOPY WITH PROPOFOL;  Surgeon: Toney Reil, MD;  Location: [facility] ENDOSCOPY;  Service: Gastroenterology;  Laterality: N/A;   ESOPHAGOGASTRODUODENOSCOPY (EGD) WITH PROPOFOL N/A  07/19/2018   Procedure: ESOPHAGOGASTRODUODENOSCOPY (EGD) WITH PROPOFOL;  Surgeon: Toney Reil, MD;  Location: [facility] ENDOSCOPY;  Service: Gastroenterology;  Laterality: N/A;   GASTRIC BYPASS     2015; duodenal switch    LAPAROSCOPIC GASTRIC BANDING  2008   removed 2009   REDUCTION MAMMAPLASTY     TUBAL LIGATION  1997   Patient Active Problem List   Diagnosis Date Noted   Normocytic anemia 06/13/2022   Elevated alkaline phosphatase level 05/28/2022   Chills 05/26/2022   Chronic left SI joint pain 11/13/2021   Piriformis syndrome, left 11/13/2021   Greater 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 11/13/2021   Primary osteoarthritis of right hip 09/16/2021   Plantar fasciitis 08/22/2021   Carpal tunnel syndrome on right 08/22/2021   Primary osteoarthritis of left hip 08/15/2021   Hyperphosphatemia 08/01/2021   Anemia in chronic kidney disease 04/18/2021   Chronic kidney disease, stage 3a (HCC) 04/18/2021   Hyperparathyroidism due to renal insufficiency (HCC) 04/18/2021   Hypertension 04/18/2021   Hiatal hernia 11/02/2020   History of migraine 11/02/2020   Gastroesophageal reflux disease 11/02/2020   Brain fog 11/02/2020   Depression, recurrent (HCC) 03/18/2020   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 09/08/2018   Arthropathy of right shoulder 09/08/2018   Controlled substance agreement signed 09/08/2018   Chronic musculoskeletal pain 08/24/2018   Routine physical examination 08/24/2018  Iron deficiency anemia 08/24/2018   Elevated liver enzymes 07/19/2018   Vitamin D deficiency 07/19/2018   Colon cancer screening    Abdominal pain, epigastric 07/08/2018   Right shoulder pain 06/15/2018   Hemorrhoids 05/01/2017   Anxiety and depression 11/24/2016   Back pain 09/11/2016   Large breasts 09/11/2016   Parotiditis 05/01/2016   Abnormal laboratory test result 03/26/2016   Proteinuria 03/26/2016   Right hip pain 04/18/2015   Acute sinusitis 01/08/2014   Psoriatic  arthritis (HCC) 09/13/2013   DUB (dysfunctional uterine bleeding) 10/27/2012   Gastritis due to nonsteroidal anti-inflammatory drug 12/12/2011   Anaphylactic reaction due to shellfish 09/27/2010   THYROMEGALY 09/03/2010   ALLERGIC RHINITIS 03/15/2010   MIGRAINE [name] 05/17/2008   PCP: Rennie Plowman FNP  REFERRING PROVIDER: [name] MD  REFERRING DIAG: [phone] (ICD-10-CM) - Primary osteoarthritis of left hip, M53.3,G89.29 (ICD-10-CM) - Chronic left SI joint pain, G57.02 (ICD-10-CM) - Piriformis syndrome, left, M25.552 (ICD-10-CM) - Greater 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  Rationale for Evaluation and Treatment: Rehabilitation  THERAPY DIAG: Pain in left hip  Muscle weakness (generalized)  ONSET DATE: 09/02/2015 (approximate)  FOLLOW-UP APPT SCHEDULED WITH REFERRING PROVIDER: Yes   FROM INITIAL EVALUATION SUBJECTIVE:                                                                                                                                                                                         SUBJECTIVE STATEMENT: L hip pain  PERTINENT HISTORY:  Pt reports a diagnosis of Lyme Disease in 2017 with persistent widespread chronic joint pain after treatment.  Of note she has severe L hip pain as well as pain in her L knee and right shoulder. Her L hip pain has been worsening recently. She denies any pain in her R hip or L shoulder. She was previously receiving L hip injections from Dr. Martha Clan however she reports that he refused to continue with further injections so she was referred by pain management to Dr. Ashley Royalty. She saw Dr. Ashley Royalty and pt received a L hip intraarticular injection as well as an injection in her L SIJ and L greater trochanter. The injections have helped with her pain. She was also referred to an orthopedic surgeon to discuss THR and referred for physical therapy. Pt expresses that she really enjoys  exercising but is very limited by her pain.   PAIN:  Pain Intensity: Present: 4/10, Best: 1/10, Worst: 10/10 Pain location: L groin, L greater trochanter, and L posterior hip Pain Quality: sharp  Radiating: Yes, down the posterior L thigh Numbness/Tingling: Yes, tingling in R hand Focal Weakness:  Yes, weakness related to pain Position of comfort: laying down (supine and R sidelying), occasionally pressure on L hip is helpful Aggravating factors: extended standing, extended sitting,  Relieving factors: exiting the bed on the right side, "I hate ice but it works," hot shower/bath helps, massage, stretches, deep squat, medication (takes at night for sleep),  24-hour pain behavior: worse at the end of the day How long can you sit: 10 minutes How long can you stand: 8-10 hours History of prior back injury, pain, surgery, or therapy: No Dominant hand: right Imaging: Yes, IMPRESSION: 1. 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 2. Moderate pubic symphysis osteoarthritis. Red flags: Negative for bowel/bladder changes, saddle paresthesia, personal history of cancer, h/o spinal tumors, h/o compression fx, h/o abdominal aneurysm, abdominal pain, chills/fever, night sweats, nausea, vomiting, unrelenting pain,   PRECAUTIONS: None  WEIGHT BEARING RESTRICTIONS: No  FALLS: Has patient fallen in last 6 months? No  Living Environment Lives with: lives with their spouse Lives in: House/apartment Stairs: Yes: Internal: 12 steps; on right going up Has following equipment at home: Single point cane, Walker - 2 wheeled, and Shower bench  Prior level of function: Independent  Occupational demands: works at [employer], standing for 8-10 hour shifts  Hobbies: Exercising (doesn't like water aerobics)  Patient Goals: Return to exercising.   OBJECTIVE:   Patient Surveys  FOTO 52, predicted improvement to 98  Cognition Patient is oriented to person, place, and  time.  Recent memory is intact.  Remote memory is intact.  Attention span and concentration are intact.  Expressive speech is intact.  Patient's fund of knowledge is within normal limits for educational level.    Gross Musculoskeletal Assessment Tremor: None Bulk: Normal Tone: Normal  GAIT: Antalgic gait on LLE but full gait assessment deferred  Posture: Lumbar lordosis: WNL Iliac crest height: Equal bilaterally Lumbar lateral shift: Negative  AROM AROM (Normal range in degrees) AROM   Lumbar   Flexion (65) WNL  Extension (30) Mild limitation with L hip pain during overpressure  Right lateral flexion (25) Mild limitation with L hip pain  Left lateral flexion (25) WNL with mild L hip pain  Right rotation (30)   Left rotation (30)       Hip Right Left  Flexion (125) WNL Limited around 90 degrees  Extension (15)    Abduction (40)    Adduction     Internal Rotation (45) To be measured To be measured  External Rotation (45) To be measured To be measured      Knee    Flexion (135) WNL WNL  Extension (0) 0 0      Ankle    Dorsiflexion (20)    Plantarflexion (50)    Inversion (35)    Eversion (15    (* = pain; Blank rows = not tested)  LE MMT: MMT (out of 5) Right  Left   Hip flexion 5 4+*  Hip extension    Hip abduction 4+ 4*  Hip adduction 4+ 4*  Hip internal rotation 5 5  Hip external rotation 5 4*  Knee flexion 4+ 4+*  Knee extension 5 5  Ankle dorsiflexion 5 5  Ankle plantarflexion    Ankle inversion    Ankle eversion    (* = pain; Blank rows = not tested)  Sensation Deferred  Reflexes Deferred   Muscle Length Hamstrings: R: Positive for shortening around 70 degrees L: Positive for shortening around 70 degrees [place] (quadriceps): R: Not  examined L: Not examined Thomas (hip flexors): R: Not examined L: Not examined Ober: R: Not examined L: Not examined  Palpation Location Right Left         Lumbar paraspinals 1 1  Quadratus Lumborum     Gluteus Maximus 1 2  Gluteus Medius 2 2  Deep hip external rotators 2 2  PSIS 1 2  Fortin's Area (SIJ) 0 0  Greater Trochanter 0 0  (Blank rows = not tested) Graded on 0-4 scale (0 = no pain, 1 = pain, 2 = pain with wincing/grimacing/flinching, 3 = pain with withdrawal, 4 = unwilling to allow palpation)  Passive Accessory Intervertebral Motion Generally hypomobile throughout lumbar spine with increased tenderness in lower lumbar segments  Special Tests Lumbar Radiculopathy and Discogenic: Centralization and Peripheralization (SN 92, -LR 0.12): Not examined Slump (SN 83, -LR 0.32): R: Negative L: Negative SLR (SN 92, -LR 0.29): R: Negative L:  Negative Crossed SLR (SP 90): R: Negative L: Negative  Facet Joint: Extension-Rotation (SN 100, -LR 0.0): R: Negative L: Negative  Lumbar Foraminal Stenosis: Lumbar quadrant (SN 70): R: Negative L: Negative  Hip: FABER (SN 81): R: Negative L: Positive FADIR (SN 94): R: Negative L: Positive Hip scour (SN 50): R: Negative L: Negative  SIJ:  Thigh Thrust (SN 88, -LR 0.18) : R: Not examined L: Not examined  Piriformis Syndrome: FAIR Test (SN 88, SP 83): R: Not examined L: Not examined  Functional Tasks: Deferred  Beighton scale: Deferred   TODAY'S TREATMENT   SUBJECTIVE: Patient's L hip has been very painful recently. She took her pain medications prior to therapy today. No specific questions currently.   PAIN: L hip pain   Ther-ex  NuStep Level 0-2 x 5 minutes for warm-up during interval history (4 minutes unbilled); Total Gym (TG) Level 22 (L22) double leg squats 2 x 15; TG L22 double leg heel raises 2 x 15; Hooklying low marches x 10 BLE; Clams 5s hold x 10; Adductor squeeze 5s hold x 10;   Manual Therapy  Supine L hamstring stretch 30s hold x 2; Supine L hip AP mobilizations at neutral, grade I, 30s/bout x 3 bouts; Supine L hip long axis gentle distraction with belt assist, 30s/bout x 3 bouts; Supine L hip  inferior mobilizations with belt assist and hip flexed to 90 degrees 30s/bout x 3 bouts; Supine L hip gentle medial to lateral mobilizations with hip flexed to 45 degrees, belt assist, 30s/bout x 3 bouts; Supine L hip gentle medial to lateral/inferior mobilizations with hip flexed to 45 degrees and slightly externally rotated within comfortable range, belt assist, 30s/bout x 3 bouts; STM to L anterior and lateral hip with Theraband Roller for neural stimulation, tissue extensibility, and pain control;   PATIENT EDUCATION:  Education details: Pt educated throughout session about proper posture and technique with exercises. Improved exercise technique, movement at target joints, use of target muscles after min to mod verbal, visual, tactile cues, Therapeutic neuroscience pain education; Person educated: Patient Education method: Explanation and handout Education comprehension: verbalized understanding   HOME EXERCISE PROGRAM:  Access Code: [email or web address] URL: [email or web address] Date: 07/17/2022 Prepared by: Ria CommentJason Aminata [place]  Exercises - Hooklying Lumbar Rotation  - 1 x daily - 7 x weekly - 2 sets - 10 reps - 3s hold - Supine March  - 1 x daily - 7 x weekly - 2 sets - 10 reps - 3s hold - Supine Bridge  - 1 x daily - 7 x weekly - 2 sets -  10 reps - 3s hold   ASSESSMENT:  CLINICAL IMPRESSION: Continued light manual techniques to L hip for pain modulation, neural stimulation, and tissue extensibility. Performed light strengthening during session today. No HEP modifications on this date but will consider at next therapy appointment. Encouraged pt to follow-up as scheduled. She will benefit from PT services to address deficits in strength, range of motion, and pain in order to improve pain with household and work responsibilities.   OBJECTIVE IMPAIRMENTS: Abnormal gait, decreased ROM, decreased strength, and pain.   ACTIVITY LIMITATIONS: lifting, bending, standing, and  squatting  PARTICIPATION LIMITATIONS: cleaning, shopping, community activity, and occupation  PERSONAL FACTORS: Past/current experiences, Time since onset of injury/illness/exacerbation, and 3+ comorbidities: depression, anxiety, migraines, chronic pain, and psoriatic arthritis  are also affecting patient's functional outcome.   REHAB POTENTIAL: Fair    CLINICAL DECISION MAKING: Unstable/unpredictable  EVALUATION COMPLEXITY: High   GOALS: Goals reviewed with patient? No  SHORT TERM GOALS: Target date: 08/05/2022  Pt will be independent with HEP in order to improve strength and range of motion as well as decrease hip pain to improve function at home and work. Baseline:  Goal status: INITIAL   LONG TERM GOALS: Target date: 09/02/2022  Pt will increase FOTO to at least 66 to demonstrate significant improvement in function at home and work related to L hip pain  Baseline: 07/08/22: 52 Goal status: INITIAL  2.  Pt will decrease worst hip pain by at least 2 points on the NPRS in order to demonstrate clinically significant reduction in hip pain. Baseline: 07/08/22: worst: 10/10 Goal status: INITIAL  3.  Pt will report at least 25% improvement in L hip symptoms in order to demonstrate clinically significant reduction in hip pain/disability so she can return to increased frequency of exercise with less pain       Baseline:  Goal status: INITIAL  4.  Pt will increase pain-free strength of L hip abduction/adduction by at least 1/2 MMT grade in order to demonstrate improvement in strength and function  Baseline: 07/08/22: 4/5 both with pain; Goal status: INITIAL   PLAN: PT FREQUENCY: 1-2x/week  PT DURATION: 8 weeks  PLANNED INTERVENTIONS: Therapeutic exercises, Therapeutic activity, Neuromuscular re-education, Balance training, Gait training, Patient/Family education, Self Care, Joint mobilization, Joint manipulation, Vestibular training, Canalith repositioning, Orthotic/Fit training,  DME instructions, Dry Needling, Electrical stimulation, Spinal manipulation, Spinal mobilization, Cryotherapy, Moist heat, Taping, Traction, Ultrasound, Ionotophoresis 4mg /ml Dexamethasone, Manual therapy, and Re-evaluation.  PLAN FOR NEXT SESSION: continue low level strengthening, therapeutic neuroscience education, review/modify HEP as needed   Mylan Schwarz PT, DPT, GCS  Leili Eskenazi, PT 07/31/2022, 4:08 PM

## 2022-08-05 ENCOUNTER — Ambulatory Visit: Payer: Federal, State, Local not specified - PPO | Attending: Family Medicine

## 2022-08-05 DIAGNOSIS — M6281 Muscle weakness (generalized): Secondary | ICD-10-CM | POA: Insufficient documentation

## 2022-08-05 DIAGNOSIS — M25552 Pain in left hip: Secondary | ICD-10-CM | POA: Insufficient documentation

## 2022-08-05 NOTE — Therapy (Signed)
OUTPATIENT PHYSICAL THERAPY HIP TREATMENT  Patient Name: Barbara Faulkner MRN: [id number] DOB:06-07-1966, 56 y.o., female Today's Date: 08/06/2022   PT End of Session - 08/06/22 0915     Visit Number 6    Number of Visits 17    Date for PT Re-Evaluation 09/02/22    Authorization Type eval: 07/08/22    PT Start Time 1445    PT Stop Time 1530    PT Time Calculation (min) 45 min    Activity Tolerance Patient tolerated treatment well    Behavior During Therapy [facility] for tasks assessed/performed             Past Medical History:  Diagnosis Date   Allergy    Anxiety    Chronic kidney disease    Chronic pain    Chronic sinusitis    COVID-19    09/14/20, 06/13/21   Depression    Heart murmur    Hiatal hernia    HPV (human papilloma virus) anogenital infection    Macromastia    Migraine    Psoriatic arthritis (HCC)    Shingles    UTI (urinary tract infection)    ecoli 09/2021   Past Surgical History:  Procedure Laterality Date   BREAST BIOPSY Right 01/28/2018   US guided biopsy - heart shaped   BREAST REDUCTION SURGERY Bilateral 04/03/2020   Procedure: MAMMARY REDUCTION  (BREAST);  Surgeon: Contogiannis, Chales Abrahams, MD;  Location: [facility];  Service: Plastics;  Laterality: Bilateral;  HAVE LIPOSUCTION MACHINE AVAILABLE   CHOLECYSTECTOMY  2009   COLONOSCOPY WITH PROPOFOL N/A 07/19/2018   Procedure: COLONOSCOPY WITH PROPOFOL;  Surgeon: Toney Reil, MD;  Location: [facility] ENDOSCOPY;  Service: Gastroenterology;  Laterality: N/A;   COLONOSCOPY WITH PROPOFOL N/A 09/17/2021   Procedure: COLONOSCOPY WITH PROPOFOL;  Surgeon: Toney Reil, MD;  Location: [facility] ENDOSCOPY;  Service: Gastroenterology;  Laterality: N/A;   COLONOSCOPY WITH PROPOFOL N/A 09/18/2021   Procedure: COLONOSCOPY WITH PROPOFOL;  Surgeon: Toney Reil, MD;  Location: [facility] ENDOSCOPY;  Service: Gastroenterology;  Laterality: N/A;   ESOPHAGOGASTRODUODENOSCOPY (EGD) WITH PROPOFOL N/A  07/19/2018   Procedure: ESOPHAGOGASTRODUODENOSCOPY (EGD) WITH PROPOFOL;  Surgeon: Toney Reil, MD;  Location: [facility] ENDOSCOPY;  Service: Gastroenterology;  Laterality: N/A;   GASTRIC BYPASS     2015; duodenal switch    LAPAROSCOPIC GASTRIC BANDING  2008   removed 2009   REDUCTION MAMMAPLASTY     TUBAL LIGATION  1997   Patient Active Problem List   Diagnosis Date Noted   Normocytic anemia 06/13/2022   Elevated alkaline phosphatase level 05/28/2022   Chills 05/26/2022   Chronic left SI joint pain 11/13/2021   Piriformis syndrome, left 11/13/2021   Greater 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 11/13/2021   Primary osteoarthritis of right hip 09/16/2021   Plantar fasciitis 08/22/2021   Carpal tunnel syndrome on right 08/22/2021   Primary osteoarthritis of left hip 08/15/2021   Hyperphosphatemia 08/01/2021   Anemia in chronic kidney disease 04/18/2021   Chronic kidney disease, stage 3a (HCC) 04/18/2021   Hyperparathyroidism due to renal insufficiency (HCC) 04/18/2021   Hypertension 04/18/2021   Hiatal hernia 11/02/2020   History of migraine 11/02/2020   Gastroesophageal reflux disease 11/02/2020   Brain fog 11/02/2020   Depression, recurrent (HCC) 03/18/2020   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 09/08/2018   Arthropathy of right shoulder 09/08/2018   Controlled substance agreement signed 09/08/2018   Chronic musculoskeletal pain 08/24/2018   Routine physical examination 08/24/2018  Iron deficiency anemia 08/24/2018   Elevated liver enzymes 07/19/2018   Vitamin D deficiency 07/19/2018   Colon cancer screening    Abdominal pain, epigastric 07/08/2018   Right shoulder pain 06/15/2018   Hemorrhoids 05/01/2017   Anxiety and depression 11/24/2016   Back pain 09/11/2016   Large breasts 09/11/2016   Parotiditis 05/01/2016   Abnormal laboratory test result 03/26/2016   Proteinuria 03/26/2016   Right hip pain 04/18/2015   Acute sinusitis 01/08/2014   Psoriatic  arthritis (HCC) 09/13/2013   DUB (dysfunctional uterine bleeding) 10/27/2012   Gastritis due to nonsteroidal anti-inflammatory drug 12/12/2011   Anaphylactic reaction due to shellfish 09/27/2010   THYROMEGALY 09/03/2010   ALLERGIC RHINITIS 03/15/2010   MIGRAINE [name] 05/17/2008   PCP: Rennie Plowman FNP  REFERRING PROVIDER: [name] MD  REFERRING DIAG: [phone] (ICD-10-CM) - Primary osteoarthritis of left hip, M53.3,G89.29 (ICD-10-CM) - Chronic left SI joint pain, G57.02 (ICD-10-CM) - Piriformis syndrome, left, M25.552 (ICD-10-CM) - Greater 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  Rationale for Evaluation and Treatment: Rehabilitation  THERAPY DIAG: Pain in left hip  Muscle weakness (generalized)  ONSET DATE: 09/02/2015 (approximate)  FOLLOW-UP APPT SCHEDULED WITH REFERRING PROVIDER: Yes   FROM INITIAL EVALUATION SUBJECTIVE:                                                                                                                                                                                         SUBJECTIVE STATEMENT: L hip pain  PERTINENT HISTORY:  Pt reports a diagnosis of Lyme Disease in 2017 with persistent widespread chronic joint pain after treatment.  Of note she has severe L hip pain as well as pain in her L knee and right shoulder. Her L hip pain has been worsening recently. She denies any pain in her R hip or L shoulder. She was previously receiving L hip injections from Dr. Martha Clan however she reports that he refused to continue with further injections so she was referred by pain management to Dr. Ashley Royalty. She saw Dr. Ashley Royalty and pt received a L hip intraarticular injection as well as an injection in her L SIJ and L greater trochanter. The injections have helped with her pain. She was also referred to an orthopedic surgeon to discuss THR and referred for physical therapy. Pt expresses that she really enjoys  exercising but is very limited by her pain.   PAIN:  Pain Intensity: Present: 4/10, Best: 1/10, Worst: 10/10 Pain location: L groin, L greater trochanter, and L posterior hip Pain Quality: sharp  Radiating: Yes, down the posterior L thigh Numbness/Tingling: Yes, tingling in R hand Focal Weakness:  Yes, weakness related to pain Position of comfort: laying down (supine and R sidelying), occasionally pressure on L hip is helpful Aggravating factors: extended standing, extended sitting,  Relieving factors: exiting the bed on the right side, "I hate ice but it works," hot shower/bath helps, massage, stretches, deep squat, medication (takes at night for sleep),  24-hour pain behavior: worse at the end of the day How long can you sit: 10 minutes How long can you stand: 8-10 hours History of prior back injury, pain, surgery, or therapy: No Dominant hand: right Imaging: Yes, IMPRESSION: 1. 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 2. Moderate pubic symphysis osteoarthritis. Red flags: Negative for bowel/bladder changes, saddle paresthesia, personal history of cancer, h/o spinal tumors, h/o compression fx, h/o abdominal aneurysm, abdominal pain, chills/fever, night sweats, nausea, vomiting, unrelenting pain,   PRECAUTIONS: None  WEIGHT BEARING RESTRICTIONS: No  FALLS: Has patient fallen in last 6 months? No  Living Environment Lives with: lives with their spouse Lives in: House/apartment Stairs: Yes: Internal: 12 steps; on right going up Has following equipment at home: Single point cane, Walker - 2 wheeled, and Shower bench  Prior level of function: Independent  Occupational demands: works at [employer], standing for 8-10 hour shifts  Hobbies: Exercising (doesn't like water aerobics)  Patient Goals: Return to exercising.   OBJECTIVE:   Patient Surveys  FOTO 52, predicted improvement to 36  Cognition Patient is oriented to person, place, and  time.  Recent memory is intact.  Remote memory is intact.  Attention span and concentration are intact.  Expressive speech is intact.  Patient's fund of knowledge is within normal limits for educational level.    Gross Musculoskeletal Assessment Tremor: None Bulk: Normal Tone: Normal  GAIT: Antalgic gait on LLE but full gait assessment deferred  Posture: Lumbar lordosis: WNL Iliac crest height: Equal bilaterally Lumbar lateral shift: Negative  AROM AROM (Normal range in degrees) AROM   Lumbar   Flexion (65) WNL  Extension (30) Mild limitation with L hip pain during overpressure  Right lateral flexion (25) Mild limitation with L hip pain  Left lateral flexion (25) WNL with mild L hip pain  Right rotation (30)   Left rotation (30)       Hip Right Left  Flexion (125) WNL Limited around 90 degrees  Extension (15)    Abduction (40)    Adduction     Internal Rotation (45) To be measured To be measured  External Rotation (45) To be measured To be measured      Knee    Flexion (135) WNL WNL  Extension (0) 0 0      Ankle    Dorsiflexion (20)    Plantarflexion (50)    Inversion (35)    Eversion (15    (* = pain; Blank rows = not tested)  LE MMT: MMT (out of 5) Right  Left   Hip flexion 5 4+*  Hip extension    Hip abduction 4+ 4*  Hip adduction 4+ 4*  Hip internal rotation 5 5  Hip external rotation 5 4*  Knee flexion 4+ 4+*  Knee extension 5 5  Ankle dorsiflexion 5 5  Ankle plantarflexion    Ankle inversion    Ankle eversion    (* = pain; Blank rows = not tested)  Sensation Deferred  Reflexes Deferred   Muscle Length Hamstrings: R: Positive for shortening around 70 degrees L: Positive for shortening around 70 degrees [place] (quadriceps): R: Not  examined L: Not examined Thomas (hip flexors): R: Not examined L: Not examined Ober: R: Not examined L: Not examined  Palpation Location Right Left         Lumbar paraspinals 1 1  Quadratus Lumborum     Gluteus Maximus 1 2  Gluteus Medius 2 2  Deep hip external rotators 2 2  PSIS 1 2  Fortin's Area (SIJ) 0 0  Greater Trochanter 0 0  (Blank rows = not tested) Graded on 0-4 scale (0 = no pain, 1 = pain, 2 = pain with wincing/grimacing/flinching, 3 = pain with withdrawal, 4 = unwilling to allow palpation)  Passive Accessory Intervertebral Motion Generally hypomobile throughout lumbar spine with increased tenderness in lower lumbar segments  Special Tests Lumbar Radiculopathy and Discogenic: Centralization and Peripheralization (SN 92, -LR 0.12): Not examined Slump (SN 83, -LR 0.32): R: Negative L: Negative SLR (SN 92, -LR 0.29): R: Negative L:  Negative Crossed SLR (SP 90): R: Negative L: Negative  Facet Joint: Extension-Rotation (SN 100, -LR 0.0): R: Negative L: Negative  Lumbar Foraminal Stenosis: Lumbar quadrant (SN 70): R: Negative L: Negative  Hip: FABER (SN 81): R: Negative L: Positive FADIR (SN 94): R: Negative L: Positive Hip scour (SN 50): R: Negative L: Negative  SIJ:  Thigh Thrust (SN 88, -LR 0.18) : R: Not examined L: Not examined  Piriformis Syndrome: FAIR Test (SN 88, SP 83): R: Not examined L: Not examined  Functional Tasks: Deferred  Beighton scale: Deferred   TODAY'S TREATMENT   SUBJECTIVE: Patient's reports improvement in her L hip pain today. She has been able to slightly decrease the amount of pain medication required to control her pain. No specific questions currently.   PAIN: L hip pain   Ther-ex  NuStep Level 0-2 x 5 minutes for warm-up during interval history (4 minutes unbilled); Total Gym (TG) Level 15 (L15) double leg squats x 15; TG L15 single leg heel raises 2 x 15 BLE; TG L15 double leg heel raises 2 x 15 BLE; Hooklying low marches x 10 BLE; Clams 5s hold x 10; Adductor squeeze 5s hold x 10; Hooklying L hip fall outs x 10; Hooklying bridges x 10;   Manual Therapy  Supine L hip AP mobilizations at neutral, grade I, 30s/bout  x 3 bouts; Supine L hip long axis gentle distraction with belt assist, 30s/bout x 3 bouts; Supine L hip inferior mobilizations with belt assist and hip flexed to 90 degrees 30s/bout x 3 bouts; Supine L hip gentle medial to lateral mobilizations with hip flexed to 45 degrees, belt assist, 30s/bout x 3 bouts; Supine L hip gentle medial to lateral/inferior mobilizations with hip flexed to 45 degrees and slightly externally rotated within comfortable range, belt assist, 30s/bout x 3 bouts; STM to L anterior and lateral hip with Theraband Roller for neural stimulation, tissue extensibility, and pain control;   PATIENT EDUCATION:  Education details: Pt educated throughout session about proper posture and technique with exercises. Improved exercise technique, movement at target joints, use of target muscles after min to mod verbal, visual, tactile cues, Therapeutic neuroscience pain education; Person educated: Patient Education method: Explanation and handout Education comprehension: verbalized understanding   HOME EXERCISE PROGRAM:  Access Code: [email or web address] URL: [email or web address] Date: 07/17/2022 Prepared by: Ria Comment  Exercises - Hooklying Lumbar Rotation  - 1 x daily - 7 x weekly - 2 sets - 10 reps - 3s hold - Supine March  - 1 x daily - 7 x weekly - 2 sets -  10 reps - 3s hold - Supine Bridge  - 1 x daily - 7 x weekly - 2 sets - 10 reps - 3s hold   ASSESSMENT:  CLINICAL IMPRESSION: Continued light manual techniques to L hip for pain modulation, neural stimulation, and tissue extensibility. Performed light strengthening during session today and progressed to single leg squats at lower body weight percentage on the total gym. No HEP modifications on this date but will consider at next therapy appointment. Encouraged pt to follow-up as scheduled. She will benefit from PT services to address deficits in strength, range of motion, and pain in order to improve pain with  household and work responsibilities.   OBJECTIVE IMPAIRMENTS: Abnormal gait, decreased ROM, decreased strength, and pain.   ACTIVITY LIMITATIONS: lifting, bending, standing, and squatting  PARTICIPATION LIMITATIONS: cleaning, shopping, community activity, and occupation  PERSONAL FACTORS: Past/current experiences, Time since onset of injury/illness/exacerbation, and 3+ comorbidities: depression, anxiety, migraines, chronic pain, and psoriatic arthritis  are also affecting patient's functional outcome.   REHAB POTENTIAL: Fair    CLINICAL DECISION MAKING: Unstable/unpredictable  EVALUATION COMPLEXITY: High   GOALS: Goals reviewed with patient? No  SHORT TERM GOALS: Target date: 08/05/2022  Pt will be independent with HEP in order to improve strength and range of motion as well as decrease hip pain to improve function at home and work. Baseline:  Goal status: INITIAL   LONG TERM GOALS: Target date: 09/02/2022  Pt will increase FOTO to at least 66 to demonstrate significant improvement in function at home and work related to L hip pain  Baseline: 07/08/22: 52 Goal status: INITIAL  2.  Pt will decrease worst hip pain by at least 2 points on the NPRS in order to demonstrate clinically significant reduction in hip pain. Baseline: 07/08/22: worst: 10/10 Goal status: INITIAL  3.  Pt will report at least 25% improvement in L hip symptoms in order to demonstrate clinically significant reduction in hip pain/disability so she can return to increased frequency of exercise with less pain       Baseline:  Goal status: INITIAL  4.  Pt will increase pain-free strength of L hip abduction/adduction by at least 1/2 MMT grade in order to demonstrate improvement in strength and function  Baseline: 07/08/22: 4/5 both with pain; Goal status: INITIAL   PLAN: PT FREQUENCY: 1-2x/week  PT DURATION: 8 weeks  PLANNED INTERVENTIONS: Therapeutic exercises, Therapeutic activity, Neuromuscular  re-education, Balance training, Gait training, Patient/Family education, Self Care, Joint mobilization, Joint manipulation, Vestibular training, Canalith repositioning, Orthotic/Fit training, DME instructions, Dry Needling, Electrical stimulation, Spinal manipulation, Spinal mobilization, Cryotherapy, Moist heat, Taping, Traction, Ultrasound, Ionotophoresis 4mg /ml Dexamethasone, Manual therapy, and Re-evaluation.  PLAN FOR NEXT SESSION: continue low level strengthening, therapeutic neuroscience education, review/modify HEP as needed   Billiejo Sorto PT, DPT, GCS  Deboraha Goar, PT 08/06/2022, 9:18 AM

## 2022-08-07 ENCOUNTER — Ambulatory Visit: Payer: Federal, State, Local not specified - PPO

## 2022-08-07 DIAGNOSIS — M6281 Muscle weakness (generalized): Secondary | ICD-10-CM | POA: Diagnosis not present

## 2022-08-07 DIAGNOSIS — M25552 Pain in left hip: Secondary | ICD-10-CM | POA: Diagnosis not present

## 2022-08-07 NOTE — Therapy (Signed)
OUTPATIENT PHYSICAL THERAPY HIP TREATMENT  Patient Name: Barbara Faulkner MRN: [id number] DOB:10-29-1965, 56 y.o., female Today's Date: 08/08/2022   PT End of Session - 08/07/22 1410     Visit Number 7    Number of Visits 17    Date for PT Re-Evaluation 09/02/22    Authorization Type eval: 07/08/22    PT Start Time 1405    PT Stop Time 1450    PT Time Calculation (min) 45 min    Activity Tolerance Patient tolerated treatment well    Behavior During Therapy [facility] for tasks assessed/performed            Past Medical History:  Diagnosis Date   Allergy    Anxiety    Chronic kidney disease    Chronic pain    Chronic sinusitis    COVID-19    09/14/20, 06/13/21   Depression    Heart murmur    Hiatal hernia    HPV (human papilloma virus) anogenital infection    Macromastia    Migraine    Psoriatic arthritis (HCC)    Shingles    UTI (urinary tract infection)    ecoli 09/2021   Past Surgical History:  Procedure Laterality Date   BREAST BIOPSY Right 01/28/2018   US guided biopsy - heart shaped   BREAST REDUCTION SURGERY Bilateral 04/03/2020   Procedure: MAMMARY REDUCTION  (BREAST);  Surgeon: Contogiannis, Chales Abrahams, MD;  Location: [facility];  Service: Plastics;  Laterality: Bilateral;  HAVE LIPOSUCTION MACHINE AVAILABLE   CHOLECYSTECTOMY  2009   COLONOSCOPY WITH PROPOFOL N/A 07/19/2018   Procedure: COLONOSCOPY WITH PROPOFOL;  Surgeon: Toney Reil, MD;  Location: [facility] ENDOSCOPY;  Service: Gastroenterology;  Laterality: N/A;   COLONOSCOPY WITH PROPOFOL N/A 09/17/2021   Procedure: COLONOSCOPY WITH PROPOFOL;  Surgeon: Toney Reil, MD;  Location: [facility] ENDOSCOPY;  Service: Gastroenterology;  Laterality: N/A;   COLONOSCOPY WITH PROPOFOL N/A 09/18/2021   Procedure: COLONOSCOPY WITH PROPOFOL;  Surgeon: Toney Reil, MD;  Location: [facility] ENDOSCOPY;  Service: Gastroenterology;  Laterality: N/A;   ESOPHAGOGASTRODUODENOSCOPY (EGD) WITH PROPOFOL N/A  07/19/2018   Procedure: ESOPHAGOGASTRODUODENOSCOPY (EGD) WITH PROPOFOL;  Surgeon: Toney Reil, MD;  Location: [facility] ENDOSCOPY;  Service: Gastroenterology;  Laterality: N/A;   GASTRIC BYPASS     2015; duodenal switch    LAPAROSCOPIC GASTRIC BANDING  2008   removed 2009   REDUCTION MAMMAPLASTY     TUBAL LIGATION  1997   Patient Active Problem List   Diagnosis Date Noted   Normocytic anemia 06/13/2022   Elevated alkaline phosphatase level 05/28/2022   Chills 05/26/2022   Chronic left SI joint pain 11/13/2021   Piriformis syndrome, left 11/13/2021   Greater 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 11/13/2021   Primary osteoarthritis of right hip 09/16/2021   Plantar fasciitis 08/22/2021   Carpal tunnel syndrome on right 08/22/2021   Primary osteoarthritis of left hip 08/15/2021   Hyperphosphatemia 08/01/2021   Anemia in chronic kidney disease 04/18/2021   Chronic kidney disease, stage 3a (HCC) 04/18/2021   Hyperparathyroidism due to renal insufficiency (HCC) 04/18/2021   Hypertension 04/18/2021   Hiatal hernia 11/02/2020   History of migraine 11/02/2020   Gastroesophageal reflux disease 11/02/2020   Brain fog 11/02/2020   Depression, recurrent (HCC) 03/18/2020   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 09/08/2018   Arthropathy of right shoulder 09/08/2018   Controlled substance agreement signed 09/08/2018   Chronic musculoskeletal pain 08/24/2018   Routine physical examination 08/24/2018  Iron deficiency anemia 08/24/2018   Elevated liver enzymes 07/19/2018   Vitamin D deficiency 07/19/2018   Colon cancer screening    Abdominal pain, epigastric 07/08/2018   Right shoulder pain 06/15/2018   Hemorrhoids 05/01/2017   Anxiety and depression 11/24/2016   Back pain 09/11/2016   Large breasts 09/11/2016   Parotiditis 05/01/2016   Abnormal laboratory test result 03/26/2016   Proteinuria 03/26/2016   Right hip pain 04/18/2015   Acute sinusitis 01/08/2014   Psoriatic  arthritis (HCC) 09/13/2013   DUB (dysfunctional uterine bleeding) 10/27/2012   Gastritis due to nonsteroidal anti-inflammatory drug 12/12/2011   Anaphylactic reaction due to shellfish 09/27/2010   THYROMEGALY 09/03/2010   ALLERGIC RHINITIS 03/15/2010   MIGRAINE [name] 05/17/2008   PCP: Rennie Plowman FNP  REFERRING PROVIDER: [name] MD  REFERRING DIAG: [phone] (ICD-10-CM) - Primary osteoarthritis of left hip, M53.3,G89.29 (ICD-10-CM) - Chronic left SI joint pain, G57.02 (ICD-10-CM) - Piriformis syndrome, left, M25.552 (ICD-10-CM) - Greater 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  Rationale for Evaluation and Treatment: Rehabilitation  THERAPY DIAG: Pain in left hip  Muscle weakness (generalized)  ONSET DATE: 09/02/2015 (approximate)  FOLLOW-UP APPT SCHEDULED WITH REFERRING PROVIDER: Yes   FROM INITIAL EVALUATION SUBJECTIVE:                                                                                                                                                                                         SUBJECTIVE STATEMENT: L hip pain  PERTINENT HISTORY:  Pt reports a diagnosis of Lyme Disease in 2017 with persistent widespread chronic joint pain after treatment.  Of note she has severe L hip pain as well as pain in her L knee and right shoulder. Her L hip pain has been worsening recently. She denies any pain in her R hip or L shoulder. She was previously receiving L hip injections from Dr. Martha Clan however she reports that he refused to continue with further injections so she was referred by pain management to Dr. Ashley Royalty. She saw Dr. Ashley Royalty and pt received a L hip intraarticular injection as well as an injection in her L SIJ and L greater trochanter. The injections have helped with her pain. She was also referred to an orthopedic surgeon to discuss THR and referred for physical therapy. Pt expresses that she really enjoys  exercising but is very limited by her pain.   PAIN:  Pain Intensity: Present: 4/10, Best: 1/10, Worst: 10/10 Pain location: L groin, L greater trochanter, and L posterior hip Pain Quality: sharp  Radiating: Yes, down the posterior L thigh Numbness/Tingling: Yes, tingling in R hand Focal Weakness:  Yes, weakness related to pain Position of comfort: laying down (supine and R sidelying), occasionally pressure on L hip is helpful Aggravating factors: extended standing, extended sitting,  Relieving factors: exiting the bed on the right side, "I hate ice but it works," hot shower/bath helps, massage, stretches, deep squat, medication (takes at night for sleep),  24-hour pain behavior: worse at the end of the day How long can you sit: 10 minutes How long can you stand: 8-10 hours History of prior back injury, pain, surgery, or therapy: No Dominant hand: right Imaging: Yes, IMPRESSION: 1. 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 2. Moderate pubic symphysis osteoarthritis. Red flags: Negative for bowel/bladder changes, saddle paresthesia, personal history of cancer, h/o spinal tumors, h/o compression fx, h/o abdominal aneurysm, abdominal pain, chills/fever, night sweats, nausea, vomiting, unrelenting pain,   PRECAUTIONS: None  WEIGHT BEARING RESTRICTIONS: No  FALLS: Has patient fallen in last 6 months? No  Living Environment Lives with: lives with their spouse Lives in: House/apartment Stairs: Yes: Internal: 12 steps; on right going up Has following equipment at home: Single point cane, Walker - 2 wheeled, and Shower bench  Prior level of function: Independent  Occupational demands: works at [employer], standing for 8-10 hour shifts  Hobbies: Exercising (doesn't like water aerobics)  Patient Goals: Return to exercising.   OBJECTIVE:   Patient Surveys  FOTO 52, predicted improvement to 36  Cognition Patient is oriented to person, place, and  time.  Recent memory is intact.  Remote memory is intact.  Attention span and concentration are intact.  Expressive speech is intact.  Patient's fund of knowledge is within normal limits for educational level.    Gross Musculoskeletal Assessment Tremor: None Bulk: Normal Tone: Normal  GAIT: Antalgic gait on LLE but full gait assessment deferred  Posture: Lumbar lordosis: WNL Iliac crest height: Equal bilaterally Lumbar lateral shift: Negative  AROM AROM (Normal range in degrees) AROM   Lumbar   Flexion (65) WNL  Extension (30) Mild limitation with L hip pain during overpressure  Right lateral flexion (25) Mild limitation with L hip pain  Left lateral flexion (25) WNL with mild L hip pain  Right rotation (30)   Left rotation (30)       Hip Right Left  Flexion (125) WNL Limited around 90 degrees  Extension (15)    Abduction (40)    Adduction     Internal Rotation (45) To be measured To be measured  External Rotation (45) To be measured To be measured      Knee    Flexion (135) WNL WNL  Extension (0) 0 0      Ankle    Dorsiflexion (20)    Plantarflexion (50)    Inversion (35)    Eversion (15    (* = pain; Blank rows = not tested)  LE MMT: MMT (out of 5) Right  Left   Hip flexion 5 4+*  Hip extension    Hip abduction 4+ 4*  Hip adduction 4+ 4*  Hip internal rotation 5 5  Hip external rotation 5 4*  Knee flexion 4+ 4+*  Knee extension 5 5  Ankle dorsiflexion 5 5  Ankle plantarflexion    Ankle inversion    Ankle eversion    (* = pain; Blank rows = not tested)  Sensation Deferred  Reflexes Deferred   Muscle Length Hamstrings: R: Positive for shortening around 70 degrees L: Positive for shortening around 70 degrees E[place] (quadriceps): R: Not  examined L: Not examined Thomas (hip flexors): R: Not examined L: Not examined Ober: R: Not examined L: Not examined  Palpation Location Right Left         Lumbar paraspinals 1 1  Quadratus Lumborum     Gluteus Maximus 1 2  Gluteus Medius 2 2  Deep hip external rotators 2 2  PSIS 1 2  Fortin's Area (SIJ) 0 0  Greater Trochanter 0 0  (Blank rows = not tested) Graded on 0-4 scale (0 = no pain, 1 = pain, 2 = pain with wincing/grimacing/flinching, 3 = pain with withdrawal, 4 = unwilling to allow palpation)  Passive Accessory Intervertebral Motion Generally hypomobile throughout lumbar spine with increased tenderness in lower lumbar segments  Special Tests Lumbar Radiculopathy and Discogenic: Centralization and Peripheralization (SN 92, -LR 0.12): Not examined Slump (SN 83, -LR 0.32): R: Negative L: Negative SLR (SN 92, -LR 0.29): R: Negative L:  Negative Crossed SLR (SP 90): R: Negative L: Negative  Facet Joint: Extension-Rotation (SN 100, -LR 0.0): R: Negative L: Negative  Lumbar Foraminal Stenosis: Lumbar quadrant (SN 70): R: Negative L: Negative  Hip: FABER (SN 81): R: Negative L: Positive FADIR (SN 94): R: Negative L: Positive Hip scour (SN 50): R: Negative L: Negative  SIJ:  Thigh Thrust (SN 88, -LR 0.18) : R: Not examined L: Not examined  Piriformis Syndrome: FAIR Test (SN 88, SP 83): R: Not examined L: Not examined  Functional Tasks: Deferred  Beighton scale: Deferred   TODAY'S TREATMENT   SUBJECTIVE: Patient's reports improvement in her L hip pain yesterday and today. No specific questions currently.   PAIN: L hip pain   Ther-ex  NuStep Level 0-2 x 5 minutes for warm-up during interval history (4 minutes unbilled); Total Gym (TG) Level 20 (L20) double leg squats x 15; TG L20 single leg squats 2 x 15 BLE; TG L20 double leg heel raises x 20 BLE; Resisted side stepping with red tband around ankles 12' x 6 lengths; Split squats x 10 BLE;   Manual Therapy  Supine L hip AP mobilizations at neutral, grade I, 30s/bout x 3 bouts; Supine L hip long axis gentle distraction with belt assist, 30s/bout x 3 bouts; Supine L hip inferior mobilizations with belt  assist and hip flexed to 90 degrees 30s/bout x 3 bouts; Supine L hip gentle medial to lateral mobilizations with hip flexed to 45 degrees, belt assist, 30s/bout x 3 bouts; Supine L hip gentle medial to lateral/inferior mobilizations with hip flexed to 45 degrees and slightly externally rotated within comfortable range, belt assist, 30s/bout x 3 bouts; STM to L anterior and lateral hip with Theraband Roller for neural stimulation, tissue extensibility, and pain control;   PATIENT EDUCATION:  Education details: Pt educated throughout session about proper posture and technique with exercises. Improved exercise technique, movement at target joints, use of target muscles after min to mod verbal, visual, tactile cues, Therapeutic neuroscience pain education; Person educated: Patient Education method: Explanation and handout Education comprehension: verbalized understanding   HOME EXERCISE PROGRAM:  Access Code: [email or web address] URL: [email or web address] Date: 07/17/2022 Prepared by: Ria Comment  Exercises - Hooklying Lumbar Rotation  - 1 x daily - 7 x weekly - 2 sets - 10 reps - 3s hold - Supine March  - 1 x daily - 7 x weekly - 2 sets - 10 reps - 3s hold - Supine Bridge  - 1 x daily - 7 x weekly - 2 sets - 10 reps - 3s hold  ASSESSMENT:  CLINICAL IMPRESSION: Continued light manual techniques to L hip for pain modulation, neural stimulation, and tissue extensibility. Progressed strengthening during session today and progressed to single leg squats at higher body weight percentage on the total gym. Pt denies any increase in her pain during exercises. Added additional standing exercises including resisted side stepping and split squats. No HEP modifications on this date but will consider at next therapy appointment. Encouraged pt to follow-up as scheduled. She will benefit from PT services to address deficits in strength, range of motion, and pain in order to improve pain with household  and work responsibilities.   OBJECTIVE IMPAIRMENTS: Abnormal gait, decreased ROM, decreased strength, and pain.   ACTIVITY LIMITATIONS: lifting, bending, standing, and squatting  PARTICIPATION LIMITATIONS: cleaning, shopping, community activity, and occupation  PERSONAL FACTORS: Past/current experiences, Time since onset of injury/illness/exacerbation, and 3+ comorbidities: depression, anxiety, migraines, chronic pain, and psoriatic arthritis  are also affecting patient's functional outcome.   REHAB POTENTIAL: Fair    CLINICAL DECISION MAKING: Unstable/unpredictable  EVALUATION COMPLEXITY: High   GOALS: Goals reviewed with patient? No  SHORT TERM GOALS: Target date: 08/05/2022  Pt will be independent with HEP in order to improve strength and range of motion as well as decrease hip pain to improve function at home and work. Baseline:  Goal status: INITIAL   LONG TERM GOALS: Target date: 09/02/2022  Pt will increase FOTO to at least 66 to demonstrate significant improvement in function at home and work related to L hip pain  Baseline: 07/08/22: 52 Goal status: INITIAL  2.  Pt will decrease worst hip pain by at least 2 points on the NPRS in order to demonstrate clinically significant reduction in hip pain. Baseline: 07/08/22: worst: 10/10 Goal status: INITIAL  3.  Pt will report at least 25% improvement in L hip symptoms in order to demonstrate clinically significant reduction in hip pain/disability so she can return to increased frequency of exercise with less pain       Baseline:  Goal status: INITIAL  4.  Pt will increase pain-free strength of L hip abduction/adduction by at least 1/2 MMT grade in order to demonstrate improvement in strength and function  Baseline: 07/08/22: 4/5 both with pain; Goal status: INITIAL   PLAN: PT FREQUENCY: 1-2x/week  PT DURATION: 8 weeks  PLANNED INTERVENTIONS: Therapeutic exercises, Therapeutic activity, Neuromuscular re-education, Balance  training, Gait training, Patient/Family education, Self Care, Joint mobilization, Joint manipulation, Vestibular training, Canalith repositioning, Orthotic/Fit training, DME instructions, Dry Needling, Electrical stimulation, Spinal manipulation, Spinal mobilization, Cryotherapy, Moist heat, Taping, Traction, Ultrasound, Ionotophoresis 4mg /ml Dexamethasone, Manual therapy, and Re-evaluation.  PLAN FOR NEXT SESSION: continue low level strengthening, therapeutic neuroscience education, review/modify HEP as needed   Daimon Kean PT, DPT, GCS  Lillyian Heidt, PT 08/08/2022, 2:39 PM

## 2022-08-11 ENCOUNTER — Encounter: Payer: Self-pay | Admitting: Family Medicine

## 2022-08-12 ENCOUNTER — Ambulatory Visit: Payer: Federal, State, Local not specified - PPO

## 2022-08-12 DIAGNOSIS — M25552 Pain in left hip: Secondary | ICD-10-CM

## 2022-08-12 DIAGNOSIS — M6281 Muscle weakness (generalized): Secondary | ICD-10-CM | POA: Diagnosis not present

## 2022-08-12 NOTE — Therapy (Signed)
OUTPATIENT PHYSICAL THERAPY HIP TREATMENT  Patient Name: Barbara Faulkner MRN: [id number] DOB:07/31/1966, 56 y.o., female Today's Date: 08/12/2022   PT End of Session - 08/12/22 1457     Visit Number 8    Number of Visits 17    Date for PT Re-Evaluation 09/02/22    Authorization Type eval: 07/08/22    PT Start Time 1445    PT Stop Time 1530    PT Time Calculation (min) 45 min    Activity Tolerance Patient tolerated treatment well    Behavior During Therapy [facility] for tasks assessed/performed            Past Medical History:  Diagnosis Date   Allergy    Anxiety    Chronic kidney disease    Chronic pain    Chronic sinusitis    COVID-19    09/14/20, 06/13/21   Depression    Heart murmur    Hiatal hernia    HPV (human papilloma virus) anogenital infection    Macromastia    Migraine    Psoriatic arthritis (HCC)    Shingles    UTI (urinary tract infection)    ecoli 09/2021   Past Surgical History:  Procedure Laterality Date   BREAST BIOPSY Right 01/28/2018   US guided biopsy - heart shaped   BREAST REDUCTION SURGERY Bilateral 04/03/2020   Procedure: MAMMARY REDUCTION  (BREAST);  Surgeon: Contogiannis, Chales Abrahams, MD;  Location: [facility];  Service: Plastics;  Laterality: Bilateral;  HAVE LIPOSUCTION MACHINE AVAILABLE   CHOLECYSTECTOMY  2009   COLONOSCOPY WITH PROPOFOL N/A 07/19/2018   Procedure: COLONOSCOPY WITH PROPOFOL;  Surgeon: Toney Reil, MD;  Location: [facility];  Service: Gastroenterology;  Laterality: N/A;   COLONOSCOPY WITH PROPOFOL N/A 09/17/2021   Procedure: COLONOSCOPY WITH PROPOFOL;  Surgeon: Toney Reil, MD;  Location: [facility];  Service: Gastroenterology;  Laterality: N/A;   COLONOSCOPY WITH PROPOFOL N/A 09/18/2021   Procedure: COLONOSCOPY WITH PROPOFOL;  Surgeon: Toney Reil, MD;  Location: [facility]COPY;  Service: Gastroenterology;  Laterality: N/A;   ESOPHAGOGASTRODUODENOSCOPY (EGD) WITH PROPOFOL N/A  07/19/2018   Procedure: ESOPHAGOGASTRODUODENOSCOPY (EGD) WITH PROPOFOL;  Surgeon: Toney Reil, MD;  Location: [facility] END[facility];  Service: Gastroenterology;  Laterality: N/A;   GASTRIC BYPASS     2015; duodenal switch    LAPAROSCOPIC GASTRIC BANDING  2008   removed 2009   REDUCTION MAMMAPLASTY     TUBAL LIGATION  1997   Patient Active Problem List   Diagnosis Date Noted   Normocytic anemia 06/13/2022   Elevated alkaline phosphatase level 05/28/2022   Chills 05/26/2022   Chronic left SI joint pain 11/13/2021   Piriformis syndrome, left 11/13/2021   Greater 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 11/13/2021   Primary osteoarthritis of right hip 09/16/2021   Plantar fasciitis 08/22/2021   Carpal tunnel syndrome on right 08/22/2021   Primary osteoarthritis of left hip 08/15/2021   Hyperphosphatemia 08/01/2021   Anemia in chronic kidney disease 04/18/2021   Chronic kidney disease, stage 3a (HCC) 04/18/2021   Hyperparathyroidism due to renal insufficiency (HCC) 04/18/2021   Hypertension 04/18/2021   Hiatal hernia 11/02/2020   History of migraine 11/02/2020   Gastroesophageal reflux disease 11/02/2020   Brain fog 11/02/2020   Depression, recurrent (HCC) 03/18/2020   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 09/08/2018   Arthropathy of right shoulder 09/08/2018   Controlled substance agreement signed 09/08/2018   Chronic musculoskeletal pain 08/24/2018   Routine physical examination 08/24/2018  Iron deficiency anemia 08/24/2018   Elevated liver enzymes 07/19/2018   Vitamin D deficiency 07/19/2018   Colon cancer screening    Abdominal pain, epigastric 07/08/2018   Right shoulder pain 06/15/2018   Hemorrhoids 05/01/2017   Anxiety and depression 11/24/2016   Back pain 09/11/2016   Large breasts 09/11/2016   Parotiditis 05/01/2016   Abnormal laboratory test result 03/26/2016   Proteinuria 03/26/2016   Right hip pain 04/18/2015   Acute sinusitis 01/08/2014   Psoriatic  arthritis (HCC) 09/13/2013   DUB (dysfunctional uterine bleeding) 10/27/2012   Gastritis due to nonsteroidal anti-inflammatory drug 12/12/2011   Anaphylactic reaction due to shellfish 09/27/2010   THYROMEGALY 09/03/2010   ALLERGIC RHINITIS 03/15/2010   MIGRAINE [name] 05/17/2008   PCP: Rennie Plowman FNP  REFERRING PROVIDER: [name] MD  REFERRING DIAG: [phone] (ICD-10-CM) - Primary osteoarthritis of left hip, M53.3,G89.29 (ICD-10-CM) - Chronic left SI joint pain, G57.02 (ICD-10-CM) - Piriformis syndrome, left, M25.552 (ICD-10-CM) - Greater 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  Rationale for Evaluation and Treatment: Rehabilitation  THERAPY DIAG: Pain in left hip  Muscle weakness (generalized)  ONSET DATE: 09/02/2015 (approximate)  FOLLOW-UP APPT SCHEDULED WITH REFERRING PROVIDER: Yes   FROM INITIAL EVALUATION SUBJECTIVE:                                                                                                                                                                                         SUBJECTIVE STATEMENT: L hip pain  PERTINENT HISTORY:  Pt reports a diagnosis of Lyme Disease in 2017 with persistent widespread chronic joint pain after treatment.  Of note she has severe L hip pain as well as pain in her L knee and right shoulder. Her L hip pain has been worsening recently. She denies any pain in her R hip or L shoulder. She was previously receiving L hip injections from Dr. Martha Clan however she reports that he refused to continue with further injections so she was referred by pain management to Dr. Ashley Royalty. She saw Dr. Ashley Royalty and pt received a L hip intraarticular injection as well as an injection in her L SIJ and L greater trochanter. The injections have helped with her pain. She was also referred to an orthopedic surgeon to discuss THR and referred for physical therapy. Pt expresses that she really enjoys  exercising but is very limited by her pain.   PAIN:  Pain Intensity: Present: 4/10, Best: 1/10, Worst: 10/10 Pain location: L groin, L greater trochanter, and L posterior hip Pain Quality: sharp  Radiating: Yes, down the posterior L thigh Numbness/Tingling: Yes, tingling in R hand Focal Weakness:  Yes, weakness related to pain Position of comfort: laying down (supine and R sidelying), occasionally pressure on L hip is helpful Aggravating factors: extended standing, extended sitting,  Relieving factors: exiting the bed on the right side, "I hate ice but it works," hot shower/bath helps, massage, stretches, deep squat, medication (takes at night for sleep),  24-hour pain behavior: worse at the end of the day How long can you sit: 10 minutes How long can you stand: 8-10 hours History of prior back injury, pain, surgery, or therapy: No Dominant hand: right Imaging: Yes, IMPRESSION: 1. 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 2. Moderate pubic symphysis osteoarthritis. Red flags: Negative for bowel/bladder changes, saddle paresthesia, personal history of cancer, h/o spinal tumors, h/o compression fx, h/o abdominal aneurysm, abdominal pain, chills/fever, night sweats, nausea, vomiting, unrelenting pain,   PRECAUTIONS: None  WEIGHT BEARING RESTRICTIONS: No  FALLS: Has patient fallen in last 6 months? No  Living Environment Lives with: lives with their spouse Lives in: House/apartment Stairs: Yes: Internal: 12 steps; on right going up Has following equipment at home: Single point cane, Walker - 2 wheeled, and Shower bench  Prior level of function: Independent  Occupational demands: works at [employer], standing for 8-10 hour shifts  Hobbies: Exercising (doesn't like water aerobics)  Patient Goals: Return to exercising.   OBJECTIVE:   Patient Surveys  FOTO 52, predicted improvement to 21  Cognition Patient is oriented to person, place, and  time.  Recent memory is intact.  Remote memory is intact.  Attention span and concentration are intact.  Expressive speech is intact.  Patient's fund of knowledge is within normal limits for educational level.    Gross Musculoskeletal Assessment Tremor: None Bulk: Normal Tone: Normal  GAIT: Antalgic gait on LLE but full gait assessment deferred  Posture: Lumbar lordosis: WNL Iliac crest height: Equal bilaterally Lumbar lateral shift: Negative  AROM AROM (Normal range in degrees) AROM   Lumbar   Flexion (65) WNL  Extension (30) Mild limitation with L hip pain during overpressure  Right lateral flexion (25) Mild limitation with L hip pain  Left lateral flexion (25) WNL with mild L hip pain  Right rotation (30)   Left rotation (30)       Hip Right Left  Flexion (125) WNL Limited around 90 degrees  Extension (15)    Abduction (40)    Adduction     Internal Rotation (45) To be measured To be measured  External Rotation (45) To be measured To be measured      Knee    Flexion (135) WNL WNL  Extension (0) 0 0      Ankle    Dorsiflexion (20)    Plantarflexion (50)    Inversion (35)    Eversion (15    (* = pain; Blank rows = not tested)  LE MMT: MMT (out of 5) Right  Left   Hip flexion 5 4+*  Hip extension    Hip abduction 4+ 4*  Hip adduction 4+ 4*  Hip internal rotation 5 5  Hip external rotation 5 4*  Knee flexion 4+ 4+*  Knee extension 5 5  Ankle dorsiflexion 5 5  Ankle plantarflexion    Ankle inversion    Ankle eversion    (* = pain; Blank rows = not tested)  Sensation Deferred  Reflexes Deferred   Muscle Length Hamstrings: R: Positive for shortening around 70 degrees L: Positive for shortening around 70 degrees [place] (quadriceps): R: Not  examined L: Not examined Thomas (hip flexors): R: Not examined L: Not examined Ober: R: Not examined L: Not examined  Palpation Location Right Left         Lumbar paraspinals 1 1  Quadratus Lumborum     Gluteus Maximus 1 2  Gluteus Medius 2 2  Deep hip external rotators 2 2  PSIS 1 2  Fortin's Area (SIJ) 0 0  Greater Trochanter 0 0  (Blank rows = not tested) Graded on 0-4 scale (0 = no pain, 1 = pain, 2 = pain with wincing/grimacing/flinching, 3 = pain with withdrawal, 4 = unwilling to allow palpation)  Passive Accessory Intervertebral Motion Generally hypomobile throughout lumbar spine with increased tenderness in lower lumbar segments  Special Tests Lumbar Radiculopathy and Discogenic: Centralization and Peripheralization (SN 92, -LR 0.12): Not examined Slump (SN 83, -LR 0.32): R: Negative L: Negative SLR (SN 92, -LR 0.29): R: Negative L:  Negative Crossed SLR (SP 90): R: Negative L: Negative  Facet Joint: Extension-Rotation (SN 100, -LR 0.0): R: Negative L: Negative  Lumbar Foraminal Stenosis: Lumbar quadrant (SN 70): R: Negative L: Negative  Hip: FABER (SN 81): R: Negative L: Positive FADIR (SN 94): R: Negative L: Positive Hip scour (SN 50): R: Negative L: Negative  SIJ:  Thigh Thrust (SN 88, -LR 0.18) : R: Not examined L: Not examined  Piriformis Syndrome: FAIR Test (SN 88, SP 83): R: Not examined L: Not examined  Functional Tasks: Deferred  Beighton scale: Deferred   TODAY'S TREATMENT   SUBJECTIVE: Patient's reports her L hip was very painful yesterday. She has had to work very hard and long hours. She had to double up on her pain medication. No specific questions currently.   PAIN: L hip pain   Ther-ex  NuStep Level 0-2 x 5 minutes for warm-up during interval history (4 minutes unbilled); Total Gym (TG) Level 22 (L20) double leg squats x 15; TG L22 single leg squats 2 x 10 BLE; Standing L hip flexor stretch using chair 2 x 30s; 6" forward step-ups leading with LLE up and RLE down x 15; Hooklying clams with manual resistance x 15; Hooklying adductor squeeze with manual resistance x 15;   Manual Therapy  Supine L hip AP mobilizations at neutral,  grade I, 30s/bout x 3 bouts; Supine L hip long axis gentle distraction with belt assist, 30s/bout x 3 bouts; Supine L hip inferior mobilizations with belt assist and hip flexed to 90 degrees 30s/bout x 3 bouts; Supine L hip gentle medial to lateral mobilizations with hip flexed to 45 degrees, belt assist, 30s/bout x 3 bouts; Supine L hip gentle medial to lateral/inferior mobilizations with hip flexed to 45 degrees and slightly externally rotated within comfortable range, belt assist, 30s/bout x 3 bouts;   PATIENT EDUCATION:  Education details: Pt educated throughout session about proper posture and technique with exercises. Improved exercise technique, movement at target joints, use of target muscles after min to mod verbal, visual, tactile cues, Therapeutic neuroscience pain education; Person educated: Patient Education method: Explanation and handout Education comprehension: verbalized understanding   HOME EXERCISE PROGRAM:  Access Code: [email or web address] URL: [email or web address] Date: 07/17/2022 Prepared by: Ria CommentJason Arav Bannister  Exercises - Hooklying Lumbar Rotation  - 1 x daily - 7 x weekly - 2 sets - 10 reps - 3s hold - Supine March  - 1 x daily - 7 x weekly - 2 sets - 10 reps - 3s hold - Supine Bridge  - 1 x daily - 7 x weekly -  2 sets - 10 reps - 3s hold   ASSESSMENT:  CLINICAL IMPRESSION: Continued light manual techniques to L hip for pain modulation, neural stimulation, and tissue extensibility. Progressed strengthening during session today and progressed to single leg squats at higher body weight percentage on the Total Gym. Pt denies any increase in her pain during exercises. Added a hip flexor stretch during session. No HEP modifications on this date. Encouraged pt to follow-up as scheduled. She will benefit from PT services to address deficits in strength, range of motion, and pain in order to improve pain with household and work responsibilities.   OBJECTIVE  IMPAIRMENTS: Abnormal gait, decreased ROM, decreased strength, and pain.   ACTIVITY LIMITATIONS: lifting, bending, standing, and squatting  PARTICIPATION LIMITATIONS: cleaning, shopping, community activity, and occupation  PERSONAL FACTORS: Past/current experiences, Time since onset of injury/illness/exacerbation, and 3+ comorbidities: depression, anxiety, migraines, chronic pain, and psoriatic arthritis  are also affecting patient's functional outcome.   REHAB POTENTIAL: Fair    CLINICAL DECISION MAKING: Unstable/unpredictable  EVALUATION COMPLEXITY: High   GOALS: Goals reviewed with patient? No  SHORT TERM GOALS: Target date: 08/05/2022  Pt will be independent with HEP in order to improve strength and range of motion as well as decrease hip pain to improve function at home and work. Baseline:  Goal status: INITIAL   LONG TERM GOALS: Target date: 09/02/2022  Pt will increase FOTO to at least 66 to demonstrate significant improvement in function at home and work related to L hip pain  Baseline: 07/08/22: 52 Goal status: INITIAL  2.  Pt will decrease worst hip pain by at least 2 points on the NPRS in order to demonstrate clinically significant reduction in hip pain. Baseline: 07/08/22: worst: 10/10 Goal status: INITIAL  3.  Pt will report at least 25% improvement in L hip symptoms in order to demonstrate clinically significant reduction in hip pain/disability so she can return to increased frequency of exercise with less pain       Baseline:  Goal status: INITIAL  4.  Pt will increase pain-free strength of L hip abduction/adduction by at least 1/2 MMT grade in order to demonstrate improvement in strength and function  Baseline: 07/08/22: 4/5 both with pain; Goal status: INITIAL   PLAN: PT FREQUENCY: 1-2x/week  PT DURATION: 8 weeks  PLANNED INTERVENTIONS: Therapeutic exercises, Therapeutic activity, Neuromuscular re-education, Balance training, Gait training, Patient/Family  education, Self Care, Joint mobilization, Joint manipulation, Vestibular training, Canalith repositioning, Orthotic/Fit training, DME instructions, Dry Needling, Electrical stimulation, Spinal manipulation, Spinal mobilization, Cryotherapy, Moist heat, Taping, Traction, Ultrasound, Ionotophoresis 4mg /ml Dexamethasone, Manual therapy, and Re-evaluation.  PLAN FOR NEXT SESSION: continue low level strengthening, therapeutic neuroscience education, review/modify HEP as needed   Mele Sylvester PT, DPT, GCS  [place] Nordquist, PT 08/12/2022, 4:58 PM

## 2022-08-14 ENCOUNTER — Ambulatory Visit: Payer: Federal, State, Local not specified - PPO

## 2022-08-14 DIAGNOSIS — M25552 Pain in left hip: Secondary | ICD-10-CM

## 2022-08-14 DIAGNOSIS — M6281 Muscle weakness (generalized): Secondary | ICD-10-CM | POA: Diagnosis not present

## 2022-08-14 NOTE — Therapy (Signed)
OUTPATIENT PHYSICAL THERAPY HIP TREATMENT  Patient Name: Barbara Faulkner MRN: [id number] DOB:05/05/1966, 56 y.o., female Today's Date: 08/15/2022   PT End of Session - 08/14/22 1452     Visit Number 9    Number of Visits 17    Date for PT Re-Evaluation 09/02/22    Authorization Type eval: 07/08/22    PT Start Time 1450    PT Stop Time 1530    PT Time Calculation (min) 40 min    Activity Tolerance Patient tolerated treatment well    Behavior During Therapy [facility] for tasks assessed/performed            Past Medical History:  Diagnosis Date   Allergy    Anxiety    Chronic kidney disease    Chronic pain    Chronic sinusitis    COVID-19    09/14/20, 06/13/21   Depression    Heart murmur    Hiatal hernia    HPV (human papilloma virus) anogenital infection    Macromastia    Migraine    Psoriatic arthritis (HCC)    Shingles    UTI (urinary tract infection)    ecoli 09/2021   Past Surgical History:  Procedure Laterality Date   BREAST BIOPSY Right 01/28/2018   US guided biopsy - heart shaped   BREAST REDUCTION SURGERY Bilateral 04/03/2020   Procedure: MAMMARY REDUCTION  (BREAST);  Surgeon: Contogiannis, Chales Abrahams, MD;  Location:  SURGERY CENTER;  Service: Plastics;  Laterality: Bilateral;  HAVE LIPOSUCTION MACHINE AVAILABLE   CHOLECYSTECTOMY  2009   COLONOSCOPY WITH PROPOFOL N/A 07/19/2018   Procedure: COLONOSCOPY WITH PROPOFOL;  Surgeon: Toney Reil, MD;  Location: [facility] ENDOSCOPY;  Service: Gastroenterology;  Laterality: N/A;   COLONOSCOPY WITH PROPOFOL N/A 09/17/2021   Procedure: COLONOSCOPY WITH PROPOFOL;  Surgeon: Toney Reil, MD;  Location: [facility] ENDOSCOPY;  Service: Gastroenterology;  Laterality: N/A;   COLONOSCOPY WITH PROPOFOL N/A 09/18/2021   Procedure: COLONOSCOPY WITH PROPOFOL;  Surgeon: Toney Reil, MD;  Location: [facility] ENDOSCOPY;  Service: Gastroenterology;  Laterality: N/A;   ESOPHAGOGASTRODUODENOSCOPY (EGD) WITH PROPOFOL N/A  07/19/2018   Procedure: ESOPHAGOGASTRODUODENOSCOPY (EGD) WITH PROPOFOL;  Surgeon: Toney Reil, MD;  Location: [facility] ENDOSCOPY;  Service: Gastroenterology;  Laterality: N/A;   GASTRIC BYPASS     2015; duodenal switch    LAPAROSCOPIC GASTRIC BANDING  2008   removed 2009   REDUCTION MAMMAPLASTY     TUBAL LIGATION  1997   Patient Active Problem List   Diagnosis Date Noted   Normocytic anemia 06/13/2022   Elevated alkaline phosphatase level 05/28/2022   Chills 05/26/2022   Chronic left SI joint pain 11/13/2021   Piriformis syndrome, left 11/13/2021   Greater 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 11/13/2021   Primary osteoarthritis of right hip 09/16/2021   Plantar fasciitis 08/22/2021   Carpal tunnel syndrome on right 08/22/2021   Primary osteoarthritis of left hip 08/15/2021   Hyperphosphatemia 08/01/2021   Anemia in chronic kidney disease 04/18/2021   Chronic kidney disease, stage 3a (HCC) 04/18/2021   Hyperparathyroidism due to renal insufficiency (HCC) 04/18/2021   Hypertension 04/18/2021   Hiatal hernia 11/02/2020   History of migraine 11/02/2020   Gastroesophageal reflux disease 11/02/2020   Brain fog 11/02/2020   Depression, recurrent (HCC) 03/18/2020   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 09/08/2018   Arthropathy of right shoulder 09/08/2018   Controlled substance agreement signed 09/08/2018   Chronic musculoskeletal pain 08/24/2018   Routine physical examination 08/24/2018  Iron deficiency anemia 08/24/2018   Elevated liver enzymes 07/19/2018   Vitamin D deficiency 07/19/2018   Colon cancer screening    Abdominal pain, epigastric 07/08/2018   Right shoulder pain 06/15/2018   Hemorrhoids 05/01/2017   Anxiety and depression 11/24/2016   Back pain 09/11/2016   Large breasts 09/11/2016   Parotiditis 05/01/2016   Abnormal laboratory test result 03/26/2016   Proteinuria 03/26/2016   Right hip pain 04/18/2015   Acute sinusitis 01/08/2014   Psoriatic  arthritis (HCC) 09/13/2013   DUB (dysfunctional uterine bleeding) 10/27/2012   Gastritis due to nonsteroidal anti-inflammatory drug 12/12/2011   Anaphylactic reaction due to shellfish 09/27/2010   THYROMEGALY 09/03/2010   ALLERGIC RHINITIS 03/15/2010   MIGRAINE [name] 05/17/2008   PCP: Rennie Plowman FNP  REFERRING PROVIDER: [name] MD  REFERRING DIAG: [phone] (ICD-10-CM) - Primary osteoarthritis of left hip, M53.3,G89.29 (ICD-10-CM) - Chronic left SI joint pain, G57.02 (ICD-10-CM) - Piriformis syndrome, left, M25.552 (ICD-10-CM) - Greater 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  Rationale for Evaluation and Treatment: Rehabilitation  THERAPY DIAG: Pain in left hip  Muscle weakness (generalized)  ONSET DATE: 09/02/2015 (approximate)  FOLLOW-UP APPT SCHEDULED WITH REFERRING PROVIDER: Yes   FROM INITIAL EVALUATION SUBJECTIVE:                                                                                                                                                                                         SUBJECTIVE STATEMENT: L hip pain  PERTINENT HISTORY:  Pt reports a diagnosis of Lyme Disease in 2017 with persistent widespread chronic joint pain after treatment.  Of note she has severe L hip pain as well as pain in her L knee and right shoulder. Her L hip pain has been worsening recently. She denies any pain in her R hip or L shoulder. She was previously receiving L hip injections from Dr. Martha Clan however she reports that he refused to continue with further injections so she was referred by pain management to Dr. Ashley Royalty. She saw Dr. Ashley Royalty and pt received a L hip intraarticular injection as well as an injection in her L SIJ and L greater trochanter. The injections have helped with her pain. She was also referred to an orthopedic surgeon to discuss THR and referred for physical therapy. Pt expresses that she really enjoys  exercising but is very limited by her pain.   PAIN:  Pain Intensity: Present: 4/10, Best: 1/10, Worst: 10/10 Pain location: L groin, L greater trochanter, and L posterior hip Pain Quality: sharp  Radiating: Yes, down the posterior L thigh Numbness/Tingling: Yes, tingling in R hand Focal Weakness:  Yes, weakness related to pain Position of comfort: laying down (supine and R sidelying), occasionally pressure on L hip is helpful Aggravating factors: extended standing, extended sitting,  Relieving factors: exiting the bed on the right side, "I hate ice but it works," hot shower/bath helps, massage, stretches, deep squat, medication (takes at night for sleep),  24-hour pain behavior: worse at the end of the day How long can you sit: 10 minutes How long can you stand: 8-10 hours History of prior back injury, pain, surgery, or therapy: No Dominant hand: right Imaging: Yes, IMPRESSION: 1. 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 2. Moderate pubic symphysis osteoarthritis. Red flags: Negative for bowel/bladder changes, saddle paresthesia, personal history of cancer, h/o spinal tumors, h/o compression fx, h/o abdominal aneurysm, abdominal pain, chills/fever, night sweats, nausea, vomiting, unrelenting pain,   PRECAUTIONS: None  WEIGHT BEARING RESTRICTIONS: No  FALLS: Has patient fallen in last 6 months? No  Living Environment Lives with: lives with their spouse Lives in: House/apartment Stairs: Yes: Internal: 12 steps; on right going up Has following equipment at home: Single point cane, Walker - 2 wheeled, and Shower bench  Prior level of function: Independent  Occupational demands: works at [employer], standing for 8-10 hour shifts  Hobbies: Exercising (doesn't like water aerobics)  Patient Goals: Return to exercising.   OBJECTIVE:   Patient Surveys  FOTO 52, predicted improvement to 36  Cognition Patient is oriented to person, place, and  time.  Recent memory is intact.  Remote memory is intact.  Attention span and concentration are intact.  Expressive speech is intact.  Patient's fund of knowledge is within normal limits for educational level.    Gross Musculoskeletal Assessment Tremor: None Bulk: Normal Tone: Normal  GAIT: Antalgic gait on LLE but full gait assessment deferred  Posture: Lumbar lordosis: WNL Iliac crest height: Equal bilaterally Lumbar lateral shift: Negative  AROM AROM (Normal range in degrees) AROM   Lumbar   Flexion (65) WNL  Extension (30) Mild limitation with L hip pain during overpressure  Right lateral flexion (25) Mild limitation with L hip pain  Left lateral flexion (25) WNL with mild L hip pain  Right rotation (30)   Left rotation (30)       Hip Right Left  Flexion (125) WNL Limited around 90 degrees  Extension (15)    Abduction (40)    Adduction     Internal Rotation (45) To be measured To be measured  External Rotation (45) To be measured To be measured      Knee    Flexion (135) WNL WNL  Extension (0) 0 0      Ankle    Dorsiflexion (20)    Plantarflexion (50)    Inversion (35)    Eversion (15    (* = pain; Blank rows = not tested)  LE MMT: MMT (out of 5) Right  Left   Hip flexion 5 4+*  Hip extension    Hip abduction 4+ 4*  Hip adduction 4+ 4*  Hip internal rotation 5 5  Hip external rotation 5 4*  Knee flexion 4+ 4+*  Knee extension 5 5  Ankle dorsiflexion 5 5  Ankle plantarflexion    Ankle inversion    Ankle eversion    (* = pain; Blank rows = not tested)  Sensation Deferred  Reflexes Deferred   Muscle Length Hamstrings: R: Positive for shortening around 70 degrees L: Positive for shortening around 70 degrees [place] (quadriceps): R: Not  examined L: Not examined Thomas (hip flexors): R: Not examined L: Not examined Ober: R: Not examined L: Not examined  Palpation Location Right Left         Lumbar paraspinals 1 1  Quadratus Lumborum     Gluteus Maximus 1 2  Gluteus Medius 2 2  Deep hip external rotators 2 2  PSIS 1 2  Fortin's Area (SIJ) 0 0  Greater Trochanter 0 0  (Blank rows = not tested) Graded on 0-4 scale (0 = no pain, 1 = pain, 2 = pain with wincing/grimacing/flinching, 3 = pain with withdrawal, 4 = unwilling to allow palpation)  Passive Accessory Intervertebral Motion Generally hypomobile throughout lumbar spine with increased tenderness in lower lumbar segments  Special Tests Lumbar Radiculopathy and Discogenic: Centralization and Peripheralization (SN 92, -LR 0.12): Not examined Slump (SN 83, -LR 0.32): R: Negative L: Negative SLR (SN 92, -LR 0.29): R: Negative L:  Negative Crossed SLR (SP 90): R: Negative L: Negative  Facet Joint: Extension-Rotation (SN 100, -LR 0.0): R: Negative L: Negative  Lumbar Foraminal Stenosis: Lumbar quadrant (SN 70): R: Negative L: Negative  Hip: FABER (SN 81): R: Negative L: Positive FADIR (SN 94): R: Negative L: Positive Hip scour (SN 50): R: Negative L: Negative  SIJ:  Thigh Thrust (SN 88, -LR 0.18) : R: Not examined L: Not examined  Piriformis Syndrome: FAIR Test (SN 88, SP 83): R: Not examined L: Not examined  Functional Tasks: Deferred  Beighton scale: Deferred   TODAY'S TREATMENT   SUBJECTIVE: Patient's reports she is doing well today and has noticed continued improvement in her hip pain. She had to double up on her pain medication again. No specific questions currently.   PAIN: L hip pain   Ther-ex  NuStep Level 0-4 x 5 minutes for warm-up during interval history (4 minutes unbilled); Total Gym (TG) Level 22 (L20) double leg squats holding 2, 4# dumbbells (DB) x 15; TG L22 single leg squats holding 2, 4# DB 2 x 10 BLE; TG L22 double leg heel raises holding 2, 4# DB x 20; Nautilus resisted gait forward, backward, R lateral, and L lateral 110-125# x 3-5 each direction; Hooklying bridges x 10; Hooklying clams with manual resistance x 15; Hooklying  adductor squeeze with manual resistance x 15;   Manual Therapy  Supine L hip AP mobilizations at neutral, grade I, 30s/bout x 3 bouts; Supine L hip long axis gentle distraction with belt assist, 30s/bout x 3 bouts; Supine L hip inferior mobilizations with belt assist and hip flexed to 90 degrees 30s/bout x 3 bouts; Supine L hip gentle medial to lateral mobilizations with hip flexed to 45 degrees, belt assist, 30s/bout x 3 bouts; Supine L hip gentle medial to lateral/inferior mobilizations with hip flexed to 45 degrees and slightly externally rotated within comfortable range, belt assist, 30s/bout x 3 bouts;   PATIENT EDUCATION:  Education details: Pt educated throughout session about proper posture and technique with exercises. Improved exercise technique, movement at target joints, use of target muscles after min to mod verbal, visual, tactile cues, Therapeutic neuroscience pain education; Person educated: Patient Education method: Explanation and handout Education comprehension: verbalized understanding   HOME EXERCISE PROGRAM:  Access Code: [email or web address] URL: [email or web address] Date: 07/17/2022 Prepared by: Ria Comment  Exercises - Hooklying Lumbar Rotation  - 1 x daily - 7 x weekly - 2 sets - 10 reps - 3s hold - Supine March  - 1 x daily - 7 x weekly - 2 sets - 10  reps - 3s hold - Supine Bridge  - 1 x daily - 7 x weekly - 2 sets - 10 reps - 3s hold   ASSESSMENT:  CLINICAL IMPRESSION: Continued light manual techniques to L hip for pain modulation, neural stimulation, and tissue extensibility. Progressed strengthening during session today. Pt denies any increase in her pain during exercises. No HEP modifications on this date. Encouraged pt to follow-up as scheduled. She will benefit from PT services to address deficits in strength, range of motion, and pain in order to improve pain with household and work responsibilities.   OBJECTIVE IMPAIRMENTS: Abnormal gait,  decreased ROM, decreased strength, and pain.   ACTIVITY LIMITATIONS: lifting, bending, standing, and squatting  PARTICIPATION LIMITATIONS: cleaning, shopping, community activity, and occupation  PERSONAL FACTORS: Past/current experiences, Time since onset of injury/illness/exacerbation, and 3+ comorbidities: depression, anxiety, migraines, chronic pain, and psoriatic arthritis  are also affecting patient's functional outcome.   REHAB POTENTIAL: Fair    CLINICAL DECISION MAKING: Unstable/unpredictable  EVALUATION COMPLEXITY: High   GOALS: Goals reviewed with patient? No  SHORT TERM GOALS: Target date: 08/05/2022  Pt will be independent with HEP in order to improve strength and range of motion as well as decrease hip pain to improve function at home and work. Baseline:  Goal status: INITIAL   LONG TERM GOALS: Target date: 09/02/2022  Pt will increase FOTO to at least 66 to demonstrate significant improvement in function at home and work related to L hip pain  Baseline: 07/08/22: 52 Goal status: INITIAL  2.  Pt will decrease worst hip pain by at least 2 points on the NPRS in order to demonstrate clinically significant reduction in hip pain. Baseline: 07/08/22: worst: 10/10 Goal status: INITIAL  3.  Pt will report at least 25% improvement in L hip symptoms in order to demonstrate clinically significant reduction in hip pain/disability so she can return to increased frequency of exercise with less pain       Baseline:  Goal status: INITIAL  4.  Pt will increase pain-free strength of L hip abduction/adduction by at least 1/2 MMT grade in order to demonstrate improvement in strength and function  Baseline: 07/08/22: 4/5 both with pain; Goal status: INITIAL   PLAN: PT FREQUENCY: 1-2x/week  PT DURATION: 8 weeks  PLANNED INTERVENTIONS: Therapeutic exercises, Therapeutic activity, Neuromuscular re-education, Balance training, Gait training, Patient/Family education, Self Care, Joint  mobilization, Joint manipulation, Vestibular training, Canalith repositioning, Orthotic/Fit training, DME instructions, Dry Needling, Electrical stimulation, Spinal manipulation, Spinal mobilization, Cryotherapy, Moist heat, Taping, Traction, Ultrasound, Ionotophoresis 4mg /ml Dexamethasone, Manual therapy, and Re-evaluation.  PLAN FOR NEXT SESSION: continue low level strengthening, therapeutic neuroscience education, review/modify HEP as needed   Lauri Purdum PT, DPT, GCS  Rhiannan Kievit, PT 08/15/2022, 10:17 PM

## 2022-08-26 ENCOUNTER — Ambulatory Visit: Payer: Federal, State, Local not specified - PPO

## 2022-08-26 DIAGNOSIS — M6281 Muscle weakness (generalized): Secondary | ICD-10-CM

## 2022-08-26 DIAGNOSIS — M25552 Pain in left hip: Secondary | ICD-10-CM

## 2022-08-26 NOTE — Therapy (Signed)
OUTPATIENT PHYSICAL THERAPY HIP TREATMENT/PROGRESS NOTE  Dates of reporting period  07/08/22   to   08/26/22   Patient Name: Barbara Faulkner MRN: [id number] DOB:1966-05-26, 56 y.o., female Today's Date: 08/27/2022   PT End of Session - 08/26/22 1451     Visit Number 10    Number of Visits 17    Date for PT Re-Evaluation 09/02/22    Authorization Type eval: 07/08/22    PT Start Time 1446    PT Stop Time 1530    PT Time Calculation (min) 44 min    Activity Tolerance Patient tolerated treatment well    Behavior During Therapy [facility] for tasks assessed/performed            Past Medical History:  Diagnosis Date   Allergy    Anxiety    Chronic kidney disease    Chronic pain    Chronic sinusitis    COVID-19    09/14/20, 06/13/21   Depression    Heart murmur    Hiatal hernia    HPV (human papilloma virus) anogenital infection    Macromastia    Migraine    Psoriatic arthritis (HCC)    Shingles    UTI (urinary tract infection)    ecoli 09/2021   Past Surgical History:  Procedure Laterality Date   BREAST BIOPSY Right 01/28/2018   US guided biopsy - heart shaped   BREAST REDUCTION SURGERY Bilateral 04/03/2020   Procedure: MAMMARY REDUCTION  (BREAST);  Surgeon: Contogiannis, Chales Abrahams, MD;  Location: [facility];  Service: Plastics;  Laterality: Bilateral;  HAVE LIPOSUCTION MACHINE AVAILABLE   CHOLECYSTECTOMY  2009   COLONOSCOPY WITH PROPOFOL N/A 07/19/2018   Procedure: COLONOSCOPY WITH PROPOFOL;  Surgeon: Toney Reil, MD;  Location: [facility] ENDOSCOPY;  Service: Gastroenterology;  Laterality: N/A;   COLONOSCOPY WITH PROPOFOL N/A 09/17/2021   Procedure: COLONOSCOPY WITH PROPOFOL;  Surgeon: Toney Reil, MD;  Location: [facility] ENDOSCOPY;  Service: Gastroenterology;  Laterality: N/A;   COLONOSCOPY WITH PROPOFOL N/A 09/18/2021   Procedure: COLONOSCOPY WITH PROPOFOL;  Surgeon: Toney Reil, MD;  Location: [facility] ENDOSCOPY;  Service: Gastroenterology;   Laterality: N/A;   ESOPHAGOGASTRODUODENOSCOPY (EGD) WITH PROPOFOL N/A 07/19/2018   Procedure: ESOPHAGOGASTRODUODENOSCOPY (EGD) WITH PROPOFOL;  Surgeon: Toney Reil, MD;  Location: [facility] ENDOSCOPY;  Service: Gastroenterology;  Laterality: N/A;   GASTRIC BYPASS     2015; duodenal switch    LAPAROSCOPIC GASTRIC BANDING  2008   removed 2009   REDUCTION MAMMAPLASTY     TUBAL LIGATION  1997   Patient Active Problem List   Diagnosis Date Noted   Normocytic anemia 06/13/2022   Elevated alkaline phosphatase level 05/28/2022   Chills 05/26/2022   Chronic left SI joint pain 11/13/2021   Piriformis syndrome, left 11/13/2021   Greater 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 11/13/2021   Primary osteoarthritis of right hip 09/16/2021   Plantar fasciitis 08/22/2021   Carpal tunnel syndrome on right 08/22/2021   Primary osteoarthritis of left hip 08/15/2021   Hyperphosphatemia 08/01/2021   Anemia in chronic kidney disease 04/18/2021   Chronic kidney disease, stage 3a (HCC) 04/18/2021   Hyperparathyroidism due to renal insufficiency (HCC) 04/18/2021   Hypertension 04/18/2021   Hiatal hernia 11/02/2020   History of migraine 11/02/2020   Gastroesophageal reflux disease 11/02/2020   Brain fog 11/02/2020   Depression, recurrent (HCC) 03/18/2020   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 09/08/2018   Arthropathy of right shoulder 09/08/2018   Controlled substance agreement signed  09/08/2018   Chronic musculoskeletal pain 08/24/2018   Routine physical examination 08/24/2018   Iron deficiency anemia 08/24/2018   Elevated liver enzymes 07/19/2018   Vitamin D deficiency 07/19/2018   Colon cancer screening    Abdominal pain, epigastric 07/08/2018   Right shoulder pain 06/15/2018   Hemorrhoids 05/01/2017   Anxiety and depression 11/24/2016   Back pain 09/11/2016   Large breasts 09/11/2016   Parotiditis 05/01/2016   Abnormal laboratory test result 03/26/2016   Proteinuria 03/26/2016    Right hip pain 04/18/2015   Acute sinusitis 01/08/2014   Psoriatic arthritis (HCC) 09/13/2013   DUB (dysfunctional uterine bleeding) 10/27/2012   Gastritis due to nonsteroidal anti-inflammatory drug 12/12/2011   Anaphylactic reaction due to shellfish 09/27/2010   THYROMEGALY 09/03/2010   ALLERGIC RHINITIS 03/15/2010   MIGRAINE [name] 05/17/2008   PCP: Rennie Plowman FNP  REFERRING PROVIDER: [name] MD  REFERRING DIAG: [phone] (ICD-10-CM) - Primary osteoarthritis of left hip, M53.3,G89.29 (ICD-10-CM) - Chronic left SI joint pain, G57.02 (ICD-10-CM) - Piriformis syndrome, left, M25.552 (ICD-10-CM) - Greater 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  Rationale for Evaluation and Treatment: Rehabilitation  THERAPY DIAG: Pain in left hip  Muscle weakness (generalized)  ONSET DATE: 09/02/2015 (approximate)  FOLLOW-UP APPT SCHEDULED WITH REFERRING PROVIDER: Yes   FROM INITIAL EVALUATION SUBJECTIVE:                                                                                                                                                                                         SUBJECTIVE STATEMENT: L hip pain  PERTINENT HISTORY:  Pt reports a diagnosis of Lyme Disease in 2017 with persistent widespread chronic joint pain after treatment.  Of note she has severe L hip pain as well as pain in her L knee and right shoulder. Her L hip pain has been worsening recently. She denies any pain in her R hip or L shoulder. She was previously receiving L hip injections from Dr. Martha Clan however she reports that he refused to continue with further injections so she was referred by pain management to Dr. Ashley Royalty. She saw Dr. Ashley Royalty and pt received a L hip intraarticular injection as well as an injection in her L SIJ and L greater trochanter. The injections have helped with her pain. She was also referred to an orthopedic surgeon to discuss THR and  referred for physical therapy. Pt expresses that she really enjoys exercising but is very limited by her pain.   PAIN:  Pain Intensity: Present: 4/10, Best: 1/10, Worst: 10/10 Pain location: L groin, L greater trochanter, and L posterior hip Pain Quality: sharp  Radiating: Yes, down the posterior L thigh Numbness/Tingling: Yes, tingling in R hand Focal Weakness: Yes, weakness related to pain Position of comfort: laying down (supine and R sidelying), occasionally pressure on L hip is helpful Aggravating factors: extended standing, extended sitting,  Relieving factors: exiting the bed on the right side, "I hate ice but it works," hot shower/bath helps, massage, stretches, deep squat, medication (takes at night for sleep),  24-hour pain behavior: worse at the end of the day How long can you sit: 10 minutes How long can you stand: 8-10 hours History of prior back injury, pain, surgery, or therapy: No Dominant hand: right Imaging: Yes, IMPRESSION: 1. 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 2. Moderate pubic symphysis osteoarthritis. Red flags: Negative for bowel/bladder changes, saddle paresthesia, personal history of cancer, h/o spinal tumors, h/o compression fx, h/o abdominal aneurysm, abdominal pain, chills/fever, night sweats, nausea, vomiting, unrelenting pain,   PRECAUTIONS: None  WEIGHT BEARING RESTRICTIONS: No  FALLS: Has patient fallen in last 6 months? No  Living Environment Lives with: lives with their spouse Lives in: House/apartment Stairs: Yes: Internal: 12 steps; on right going up Has following equipment at home: Single point cane, Walker - 2 wheeled, and Shower bench  Prior level of function: Independent  Occupational demands: works at [employer], standing for 8-10 hour shifts  Hobbies: Exercising (doesn't like water aerobics)  Patient Goals: Return to exercising.   OBJECTIVE:   Patient Surveys  FOTO 52, predicted  improvement to 75  Cognition Patient is oriented to person, place, and time.  Recent memory is intact.  Remote memory is intact.  Attention span and concentration are intact.  Expressive speech is intact.  Patient's fund of knowledge is within normal limits for educational level.    Gross Musculoskeletal Assessment Tremor: None Bulk: Normal Tone: Normal  GAIT: Antalgic gait on LLE but full gait assessment deferred  Posture: Lumbar lordosis: WNL Iliac crest height: Equal bilaterally Lumbar lateral shift: Negative  AROM AROM (Normal range in degrees) AROM   Lumbar   Flexion (65) WNL  Extension (30) Mild limitation with L hip pain during overpressure  Right lateral flexion (25) Mild limitation with L hip pain  Left lateral flexion (25) WNL with mild L hip pain  Right rotation (30)   Left rotation (30)       Hip Right Left  Flexion (125) WNL Limited around 90 degrees  Extension (15)    Abduction (40)    Adduction     Internal Rotation (45) To be measured To be measured  External Rotation (45) To be measured To be measured      Knee    Flexion (135) WNL WNL  Extension (0) 0 0      Ankle    Dorsiflexion (20)    Plantarflexion (50)    Inversion (35)    Eversion (15    (* = pain; Blank rows = not tested)  LE MMT: MMT (out of 5) Right  Left   Hip flexion 5 4+*  Hip extension    Hip abduction 4+ 4*  Hip adduction 4+ 4*  Hip internal rotation 5 5  Hip external rotation 5 4*  Knee flexion 4+ 4+*  Knee extension 5 5  Ankle dorsiflexion 5 5  Ankle plantarflexion    Ankle inversion    Ankle eversion    (* = pain; Blank rows = not tested)  Sensation Deferred  Reflexes Deferred   Muscle Length Hamstrings: R: Positive for  shortening around 70 degrees L: Positive for shortening around 70 degrees [place] (quadriceps): R: Not examined L: Not examined Thomas (hip flexors): R: Not examined L: Not examined Ober: R: Not examined L: Not  examined  Palpation Location Right Left         Lumbar paraspinals 1 1  Quadratus Lumborum    Gluteus Maximus 1 2  Gluteus Medius 2 2  Deep hip external rotators 2 2  PSIS 1 2  Fortin's Area (SIJ) 0 0  Greater Trochanter 0 0  (Blank rows = not tested) Graded on 0-4 scale (0 = no pain, 1 = pain, 2 = pain with wincing/grimacing/flinching, 3 = pain with withdrawal, 4 = unwilling to allow palpation)  Passive Accessory Intervertebral Motion Generally hypomobile throughout lumbar spine with increased tenderness in lower lumbar segments  Special Tests Lumbar Radiculopathy and Discogenic: Centralization and Peripheralization (SN 92, -LR 0.12): Not examined Slump (SN 83, -LR 0.32): R: Negative L: Negative SLR (SN 92, -LR 0.29): R: Negative L:  Negative Crossed SLR (SP 90): R: Negative L: Negative  Facet Joint: Extension-Rotation (SN 100, -LR 0.0): R: Negative L: Negative  Lumbar Foraminal Stenosis: Lumbar quadrant (SN 70): R: Negative L: Negative  Hip: FABER (SN 81): R: Negative L: Positive FADIR (SN 94): R: Negative L: Positive Hip scour (SN 50): R: Negative L: Negative  SIJ:  Thigh Thrust (SN 88, -LR 0.18) : R: Not examined L: Not examined  Piriformis Syndrome: FAIR Test (SN 88, SP 83): R: Not examined L: Not examined  Functional Tasks: Deferred  Beighton scale: Deferred   TODAY'S TREATMENT   SUBJECTIVE: Patient's reports her hip pain has been worse recently. She reports 6/10 L hip pain upon arrival today. No specific questions currently;   PAIN: 6/10 L hip pain   Ther-ex  NuStep Level 0-4 x 5 minutes for warm-up during interval history (4 minutes unbilled); Nautilus resisted gait forward, backward, R lateral, and L lateral 125# x 3 each direction; Hooklying clams with manual resistance x 15; Hooklying adductor squeeze with manual resistance x 15;   Manual Therapy  Supine L hip AP mobilizations at neutral, grade I, 30s/bout x 3 bouts; Supine L hip long axis  gentle distraction with belt assist, 30s/bout x 3 bouts; Supine L hip inferior mobilizations with belt assist and hip flexed to 90 degrees 30s/bout x 3 bouts; Supine L hip gentle medial to lateral mobilizations with hip flexed to 45 degrees, belt assist, 30s/bout x 3 bouts; Supine L hip gentle medial to lateral/inferior mobilizations with hip flexed to 45 degrees and slightly externally rotated within comfortable range, belt assist, 30s/bout x 3 bouts;   Not Performed: Total Gym (TG) Level 22 (L20) double leg squats holding 2, 4# dumbbells (DB) x 15; TG L22 single leg squats holding 2, 4# DB 2 x 10 BLE; TG L22 double leg heel raises holding 2, 4# DB x 20;   PATIENT EDUCATION:  Education details: Pt educated throughout session about proper posture and technique with exercises. Improved exercise technique, movement at target joints, use of target muscles after min to mod verbal, visual, tactile cues, Therapeutic neuroscience pain education; Person educated: Patient Education method: Explanation and handout Education comprehension: verbalized understanding   HOME EXERCISE PROGRAM:  Access Code: [email or web address] URL: [email or web address] Date: 07/17/2022 Prepared by: Ria CommentJason Cathline Dowen  Exercises - Hooklying Lumbar Rotation  - 1 x daily - 7 x weekly - 2 sets - 10 reps - 3s hold - Supine March  - 1 x daily -  7 x weekly - 2 sets - 10 reps - 3s hold - Supine Bridge  - 1 x daily - 7 x weekly - 2 sets - 10 reps - 3s hold   ASSESSMENT:  CLINICAL IMPRESSION: Continued light manual techniques to L hip for pain modulation, neural stimulation, and tissue extensibility. Progressed strengthening during session today but avoided Total Gym as pt reports recent worsening of L knee pain. She denies any increase in her pain during exercises today. No HEP modifications on this date. Given increase in her pain today deferred updating goals until next session. Encouraged pt to follow-up as scheduled. She  will benefit from PT services to address deficits in strength, range of motion, and pain in order to improve pain with household and work responsibilities.   OBJECTIVE IMPAIRMENTS: Abnormal gait, decreased ROM, decreased strength, and pain.   ACTIVITY LIMITATIONS: lifting, bending, standing, and squatting  PARTICIPATION LIMITATIONS: cleaning, shopping, community activity, and occupation  PERSONAL FACTORS: Past/current experiences, Time since onset of injury/illness/exacerbation, and 3+ comorbidities: depression, anxiety, migraines, chronic pain, and psoriatic arthritis  are also affecting patient's functional outcome.   REHAB POTENTIAL: Fair    CLINICAL DECISION MAKING: Unstable/unpredictable  EVALUATION COMPLEXITY: High   GOALS: Goals reviewed with patient? No  SHORT TERM GOALS: Target date: 08/05/2022  Pt will be independent with HEP in order to improve strength and range of motion as well as decrease hip pain to improve function at home and work. Baseline:  Goal status: INITIAL   LONG TERM GOALS: Target date: 09/02/2022  Pt will increase FOTO to at least 66 to demonstrate significant improvement in function at home and work related to L hip pain  Baseline: 07/08/22: 52 Goal status: INITIAL  2.  Pt will decrease worst hip pain by at least 2 points on the NPRS in order to demonstrate clinically significant reduction in hip pain. Baseline: 07/08/22: worst: 10/10 Goal status: INITIAL  3.  Pt will report at least 25% improvement in L hip symptoms in order to demonstrate clinically significant reduction in hip pain/disability so she can return to increased frequency of exercise with less pain       Baseline:  Goal status: INITIAL  4.  Pt will increase pain-free strength of L hip abduction/adduction by at least 1/2 MMT grade in order to demonstrate improvement in strength and function  Baseline: 07/08/22: 4/5 both with pain; Goal status: INITIAL   PLAN: PT FREQUENCY:  1-2x/week  PT DURATION: 8 weeks  PLANNED INTERVENTIONS: Therapeutic exercises, Therapeutic activity, Neuromuscular re-education, Balance training, Gait training, Patient/Family education, Self Care, Joint mobilization, Joint manipulation, Vestibular training, Canalith repositioning, Orthotic/Fit training, DME instructions, Dry Needling, Electrical stimulation, Spinal manipulation, Spinal mobilization, Cryotherapy, Moist heat, Taping, Traction, Ultrasound, Ionotophoresis 4mg /ml Dexamethasone, Manual therapy, and Re-evaluation.  PLAN FOR NEXT SESSION: continue low level strengthening, therapeutic neuroscience education, review/modify HEP as needed   Esmond Hinch PT, DPT, GCS  Jerick Khachatryan, PT 08/27/2022, 8:02 AM

## 2022-08-31 DIAGNOSIS — B370 Candidal stomatitis: Secondary | ICD-10-CM | POA: Diagnosis not present

## 2022-09-10 DIAGNOSIS — F419 Anxiety disorder, unspecified: Secondary | ICD-10-CM | POA: Diagnosis not present

## 2022-09-10 DIAGNOSIS — F4312 Post-traumatic stress disorder, chronic: Secondary | ICD-10-CM | POA: Diagnosis not present

## 2022-09-10 DIAGNOSIS — F39 Unspecified mood [affective] disorder: Secondary | ICD-10-CM | POA: Diagnosis not present

## 2022-09-11 ENCOUNTER — Ambulatory Visit: Payer: Federal, State, Local not specified - PPO | Attending: Family Medicine

## 2022-09-11 DIAGNOSIS — M25552 Pain in left hip: Secondary | ICD-10-CM

## 2022-09-11 DIAGNOSIS — M6281 Muscle weakness (generalized): Secondary | ICD-10-CM

## 2022-09-11 NOTE — Therapy (Signed)
OUTPATIENT PHYSICAL THERAPY HIP TREATMENT/RECERTIFICATION  Patient Name: Barbara Faulkner MRN: [id number] DOB:[birth year], 57 y.o., female Today's Date: 09/13/2022   PT End of Session - 09/13/22 1007     Visit Number 11    Number of Visits 33    Date for PT Re-Evaluation 11/06/22    Authorization Type eval: 07/08/22    PT Start Time 1450    PT Stop Time 1530    PT Time Calculation (min) 40 min    Activity Tolerance Patient tolerated treatment well    Behavior During Therapy [facility] for tasks assessed/performed            Past Medical History:  Diagnosis Date   Allergy    Anxiety    Chronic kidney disease    Chronic pain    Chronic sinusitis    COVID-19    09/14/20, 06/13/21   Depression    Heart murmur    Hiatal hernia    HPV (human papilloma virus) anogenital infection    Macromastia    Migraine    Psoriatic arthritis (HCC)    Shingles    UTI (urinary tract infection)    ecoli 09/2021   Past Surgical History:  Procedure Laterality Date   BREAST BIOPSY Right 01/28/2018   US guided biopsy - heart shaped   BREAST REDUCTION SURGERY Bilateral 04/03/2020   Procedure: MAMMARY REDUCTION  (BREAST);  Surgeon: Contogiannis, Chales Abrahams, MD;  Location: [facility];  Service: Plastics;  Laterality: Bilateral;  HAVE LIPOSUCTION MACHINE AVAILABLE   CHOLECYSTECTOMY  2009   COLONOSCOPY WITH PROPOFOL N/A 07/19/2018   Procedure: COLONOSCOPY WITH PROPOFOL;  Surgeon: Toney Reil, MD;  Location: [facility] ENDOSCOPY;  Service: Gastroenterology;  Laterality: N/A;   COLONOSCOPY WITH PROPOFOL N/A 09/17/2021   Procedure: COLONOSCOPY WITH PROPOFOL;  Surgeon: Toney Reil, MD;  Location: [facility] ENDOSCOPY;  Service: Gastroenterology;  Laterality: N/A;   COLONOSCOPY WITH PROPOFOL N/A 09/18/2021   Procedure: COLONOSCOPY WITH PROPOFOL;  Surgeon: Toney Reil, MD;  Location: [facility] ENDOSCOPY;  Service: Gastroenterology;  Laterality: N/A;   ESOPHAGOGASTRODUODENOSCOPY (EGD) WITH  PROPOFOL N/A 07/19/2018   Procedure: ESOPHAGOGASTRODUODENOSCOPY (EGD) WITH PROPOFOL;  Surgeon: Toney Reil, MD;  Location: [facility] ENDOSCOPY;  Service: Gastroenterology;  Laterality: N/A;   GASTRIC BYPASS     2015; duodenal switch    LAPAROSCOPIC GASTRIC BANDING  2008   removed 2009   REDUCTION MAMMAPLASTY     TUBAL LIGATION  1997   Patient Active Problem List   Diagnosis Date Noted   Normocytic anemia 06/13/2022   Elevated alkaline phosphatase level 05/28/2022   Chills 05/26/2022   Chronic left SI joint pain 11/13/2021   Piriformis syndrome, left 11/13/2021   Greater 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 11/13/2021   Primary osteoarthritis of right hip 09/16/2021   Plantar fasciitis 08/22/2021   Carpal tunnel syndrome on right 08/22/2021   Primary osteoarthritis of left hip 08/15/2021   Hyperphosphatemia 08/01/2021   Anemia in chronic kidney disease 04/18/2021   Chronic kidney disease, stage 3a (HCC) 04/18/2021   Hyperparathyroidism due to renal insufficiency (HCC) 04/18/2021   Hypertension 04/18/2021   Hiatal hernia 11/02/2020   History of migraine 11/02/2020   Gastroesophageal reflux disease 11/02/2020   Brain fog 11/02/2020   Depression, recurrent (HCC) 03/18/2020   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 09/08/2018   Arthropathy of right shoulder 09/08/2018   Controlled substance agreement signed 09/08/2018   Chronic musculoskeletal pain 08/24/2018   Routine physical examination 08/24/2018  Iron deficiency anemia 08/24/2018   Elevated liver enzymes 07/19/2018   Vitamin D deficiency 07/19/2018   Colon cancer screening    Abdominal pain, epigastric 07/08/2018   Right shoulder pain 06/15/2018   Hemorrhoids 05/01/2017   Anxiety and depression 11/24/2016   Back pain 09/11/2016   Large breasts 09/11/2016   Parotiditis 05/01/2016   Abnormal laboratory test result 03/26/2016   Proteinuria 03/26/2016   Right hip pain 04/18/2015   Acute sinusitis 01/08/2014    Psoriatic arthritis ([place]) 09/13/2013   DUB (dysfunctional uterine bleeding) 10/27/2012   Gastritis due to nonsteroidal anti-inflammatory drug 12/12/2011   Anaphylactic reaction due to shellfish 09/27/2010   THYROMEGALY 09/03/2010   ALLERGIC RHINITIS 03/15/2010   MIGRAINE Elio Forget W/O INTRACT W/O STATUS MIGRNOSUS 05/17/2008   PCP: [name] FNP  REFERRING PROVIDER: Rosette Reveal MD  REFERRING DIAG: [phone] (ICD-10-CM) - Primary osteoarthritis of left hip, M53.3,G89.29 (ICD-10-CM) - Chronic left SI joint pain, G57.02 (ICD-10-CM) - Piriformis syndrome, left, M25.552 (ICD-10-CM) - Greater 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  Rationale for Evaluation and Treatment: Rehabilitation  THERAPY DIAG: Pain in left hip - Plan: PT plan of care cert/re-cert  Muscle weakness (generalized) - Plan: PT plan of care cert/re-cert  ONSET DATE: [id number] (approximate)  FOLLOW-UP APPT SCHEDULED WITH REFERRING PROVIDER: Yes   FROM INITIAL EVALUATION SUBJECTIVE:                                                                                                                                                                                         SUBJECTIVE STATEMENT: L hip pain  PERTINENT HISTORY:  Pt reports a diagnosis of Lyme Disease in 2017 with persistent widespread chronic joint pain after treatment.  Of note she has severe L hip pain as well as pain in her L knee and right shoulder. Her L hip pain has been worsening recently. She denies any pain in her R hip or L shoulder. She was previously receiving L hip injections from Dr. Mack Guise however she reports that he refused to continue with further injections so she was referred by pain management to Dr. Zigmund Daniel. She saw Dr. Zigmund Daniel and pt received a L hip intraarticular injection as well as an injection in her L SIJ and L greater trochanter. The injections have helped with her pain. She was also referred to an orthopedic surgeon to discuss  THR and referred for physical therapy. Pt expresses that she really enjoys exercising but is very limited by her pain.   PAIN:  Pain Intensity: Present: 4/10, Best: 1/10, Worst: 10/10 Pain location: L groin, L greater trochanter, and L posterior hip Pain Quality: sharp  Radiating:  Yes, down the posterior L thigh Numbness/Tingling: Yes, tingling in R hand Focal Weakness: Yes, weakness related to pain Position of comfort: laying down (supine and R sidelying), occasionally pressure on L hip is helpful Aggravating factors: extended standing, extended sitting,  Relieving factors: exiting the bed on the right side, "I hate ice but it works," hot shower/bath helps, massage, stretches, deep squat, medication (takes at night for sleep),  24-hour pain behavior: worse at the end of the day How long can you sit: 10 minutes How long can you stand: 8-10 hours History of prior back injury, pain, surgery, or therapy: No Dominant hand: right Imaging: Yes, IMPRESSION: 1. 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 2. Moderate pubic symphysis osteoarthritis. Red flags: Negative for bowel/bladder changes, saddle paresthesia, personal history of cancer, h/o spinal tumors, h/o compression fx, h/o abdominal aneurysm, abdominal pain, chills/fever, night sweats, nausea, vomiting, unrelenting pain,   PRECAUTIONS: None  WEIGHT BEARING RESTRICTIONS: No  FALLS: Has patient fallen in last 6 months? No  Living Environment Lives with: lives with their spouse Lives in: House/apartment Stairs: Yes: Internal: 12 steps; on right going up Has following equipment at home: Single point cane, Walker - 2 wheeled, and Shower bench  Prior level of function: Independent  Occupational demands: works at [employer], standing for 8-10 hour shifts  Hobbies: Exercising (doesn't like water aerobics)  Patient Goals: Return to exercising.   OBJECTIVE:   Patient Surveys  FOTO 52, predicted  improvement to 12  Cognition Patient is oriented to person, place, and time.  Recent memory is intact.  Remote memory is intact.  Attention span and concentration are intact.  Expressive speech is intact.  Patient's fund of knowledge is within normal limits for educational level.    Gross Musculoskeletal Assessment Tremor: None Bulk: Normal Tone: Normal  GAIT: Antalgic gait on LLE but full gait assessment deferred  Posture: Lumbar lordosis: WNL Iliac crest height: Equal bilaterally Lumbar lateral shift: Negative  AROM AROM (Normal range in degrees) AROM   Lumbar   Flexion (65) WNL  Extension (30) Mild limitation with L hip pain during overpressure  Right lateral flexion (25) Mild limitation with L hip pain  Left lateral flexion (25) WNL with mild L hip pain  Right rotation (30)   Left rotation (30)       Hip Right Left  Flexion (125) WNL Limited around 90 degrees  Extension (15)    Abduction (40)    Adduction     Internal Rotation (45) To be measured To be measured  External Rotation (45) To be measured To be measured      Knee    Flexion (135) WNL WNL  Extension (0) 0 0      Ankle    Dorsiflexion (20)    Plantarflexion (50)    Inversion (35)    Eversion (15    (* = pain; Blank rows = not tested)  LE MMT: MMT (out of 5) Right  Left   Hip flexion 5 4+*  Hip extension    Hip abduction 4+ 4*  Hip adduction 4+ 4*  Hip internal rotation 5 5  Hip external rotation 5 4*  Knee flexion 4+ 4+*  Knee extension 5 5  Ankle dorsiflexion 5 5  Ankle plantarflexion    Ankle inversion    Ankle eversion    (* = pain; Blank rows = not tested)  Sensation Deferred  Reflexes Deferred   Muscle Length Hamstrings: R: Positive for shortening  around 70 degrees L: Positive for shortening around 70 degrees [place] (quadriceps): R: Not examined L: Not examined Thomas (hip flexors): R: Not examined L: Not examined Ober: R: Not examined L: Not  examined  Palpation Location Right Left         Lumbar paraspinals 1 1  Quadratus Lumborum    Gluteus Maximus 1 2  Gluteus Medius 2 2  Deep hip external rotators 2 2  PSIS 1 2  Fortin's Area (SIJ) 0 0  Greater Trochanter 0 0  (Blank rows = not tested) Graded on 0-4 scale (0 = no pain, 1 = pain, 2 = pain with wincing/grimacing/flinching, 3 = pain with withdrawal, 4 = unwilling to allow palpation)  Passive Accessory Intervertebral Motion Generally hypomobile throughout lumbar spine with increased tenderness in lower lumbar segments  Special Tests Lumbar Radiculopathy and Discogenic: Centralization and Peripheralization (SN 92, -LR 0.12): Not examined Slump (SN 83, -LR 0.32): R: Negative L: Negative SLR (SN 92, -LR 0.29): R: Negative L:  Negative Crossed SLR (SP 90): R: Negative L: Negative  Facet Joint: Extension-Rotation (SN 100, -LR 0.0): R: Negative L: Negative  Lumbar Foraminal Stenosis: Lumbar quadrant (SN 70): R: Negative L: Negative  Hip: FABER (SN 81): R: Negative L: Positive FADIR (SN 94): R: Negative L: Positive Hip scour (SN 50): R: Negative L: Negative  SIJ:  Thigh Thrust (SN 88, -LR 0.18) : R: Not examined L: Not examined  Piriformis Syndrome: FAIR Test (SN 88, SP 83): R: Not examined L: Not examined  Functional Tasks: Deferred  Beighton scale: Deferred   TODAY'S TREATMENT   SUBJECTIVE: Patient's reports her hip pain has been bad recently. She is scheduled for her L hip injection tomorrow. No specific questions currently;   PAIN: 8.5/10 L hip pain   Ther-ex  NuStep Level 0-2 x 7 minutes for warm-up during interval history (2 minutes unbilled); Hooklying clams with manual resistance 2 x 15; Hooklying adductor squeeze with manual resistance 2 x 15; Supine L hip straight leg abduction/adduction with manual resistance x 10 each; Supine LLE heel slide with manually resisted extension with pt pushing into therapist x 10;   Manual Therapy  Supine L  hip AP mobilizations at neutral, grade I, 30s/bout x 3 bouts; Supine L hip long axis gentle distraction with belt assist, 30s/bout x 3 bouts; Supine L hip inferior mobilizations with belt assist and hip flexed to 90 degrees 30s/bout x 3 bouts; Supine L hip gentle medial to lateral mobilizations with hip flexed to 45 degrees, belt assist, 30s/bout x 3 bouts; Supine L hip gentle medial to lateral/inferior mobilizations with hip flexed to 45 degrees and slightly externally rotated within comfortable range, belt assist, 30s/bout x 3 bouts;   Not Performed: Total Gym (TG) Level 22 (L20) double leg squats holding 2, 4# dumbbells (DB) x 15; TG L22 single leg squats holding 2, 4# DB 2 x 10 BLE; TG L22 double leg heel raises holding 2, 4# DB x 20; Nautilus resisted gait forward, backward, R lateral, and L lateral 125# x 3 each direction;   PATIENT EDUCATION:  Education details: Pt educated throughout session about proper posture and technique with exercises. Improved exercise technique, movement at target joints, use of target muscles after min to mod verbal, visual, tactile cues, Therapeutic neuroscience pain education; Person educated: Patient Education method: Explanation and handout Education comprehension: verbalized understanding   HOME EXERCISE PROGRAM:  Access Code: [email or web address] URL: [email or web address] Date: 07/17/2022 Prepared by: Roxana Hires  Exercises - Hooklying Lumbar  Rotation  - 1 x daily - 7 x weekly - 2 sets - 10 reps - 3s hold - Supine March  - 1 x daily - 7 x weekly - 2 sets - 10 reps - 3s hold - Supine Bridge  - 1 x daily - 7 x weekly - 2 sets - 10 reps - 3s hold   ASSESSMENT:  CLINICAL IMPRESSION: Continued light manual techniques to L hip for pain modulation, neural stimulation, and tissue extensibility. Performed only light mat table strengthening due to highly irritable hip pain today. She is scheduled for a hip injection tomorrow. Deferred updating  outcome measures/goals due to high level of pain today but will update at next session. No HEP modifications on this date. Encouraged pt to follow-up as scheduled. She will benefit from PT services to address deficits in strength, range of motion, and pain in order to improve pain with household and work responsibilities.   OBJECTIVE IMPAIRMENTS: Abnormal gait, decreased ROM, decreased strength, and pain.   ACTIVITY LIMITATIONS: lifting, bending, standing, and squatting  PARTICIPATION LIMITATIONS: cleaning, shopping, community activity, and occupation  PERSONAL FACTORS: Past/current experiences, Time since onset of injury/illness/exacerbation, and 3+ comorbidities: depression, anxiety, migraines, chronic pain, and psoriatic arthritis  are also affecting patient's functional outcome.   REHAB POTENTIAL: Fair    CLINICAL DECISION MAKING: Unstable/unpredictable  EVALUATION COMPLEXITY: High   GOALS: Goals reviewed with patient? No  SHORT TERM GOALS: Target date: 10/09/2022   Pt will be independent with HEP in order to improve strength and range of motion as well as decrease hip pain to improve function at home and work. Baseline:  Goal status: INITIAL   LONG TERM GOALS: Target date: 11/06/2022  Pt will increase FOTO to at least 66 to demonstrate significant improvement in function at home and work related to L hip pain  Baseline: 07/08/22: 52 Goal status: INITIAL  2.  Pt will decrease worst hip pain by at least 2 points on the NPRS in order to demonstrate clinically significant reduction in hip pain. Baseline: 07/08/22: worst: 10/10 Goal status: INITIAL  3.  Pt will report at least 25% improvement in L hip symptoms in order to demonstrate clinically significant reduction in hip pain/disability so she can return to increased frequency of exercise with less pain       Baseline:  Goal status: INITIAL  4.  Pt will increase pain-free strength of L hip abduction/adduction by at least 1/2 MMT  grade in order to demonstrate improvement in strength and function  Baseline: 07/08/22: 4/5 both with pain; Goal status: INITIAL   PLAN: PT FREQUENCY: 1-2x/week  PT DURATION: 8 weeks  PLANNED INTERVENTIONS: Therapeutic exercises, Therapeutic activity, Neuromuscular re-education, Balance training, Gait training, Patient/Family education, Self Care, Joint mobilization, Joint manipulation, Vestibular training, Canalith repositioning, Orthotic/Fit training, DME instructions, Dry Needling, Electrical stimulation, Spinal manipulation, Spinal mobilization, Cryotherapy, Moist heat, Taping, Traction, Ultrasound, Ionotophoresis 4mg /ml Dexamethasone, Manual therapy, and Re-evaluation.  PLAN FOR NEXT SESSION: update outcome measures/goals, continue low level strengthening, therapeutic neuroscience education, review/modify HEP as needed   Lyndel Safe Niylah Hassan PT, DPT, GCS  Lequisha Cammack, PT 09/13/2022, 10:14 AM

## 2022-09-12 ENCOUNTER — Ambulatory Visit: Payer: Federal, State, Local not specified - PPO | Admitting: Family Medicine

## 2022-09-12 ENCOUNTER — Ambulatory Visit
Admission: RE | Admit: 2022-09-12 | Discharge: 2022-09-12 | Disposition: A | Discharge status: Home / Self Care | Payer: Federal, State, Local not specified - PPO | Source: Ambulatory Visit | Attending: Family Medicine | Admitting: Family Medicine

## 2022-09-12 ENCOUNTER — Encounter: Payer: Self-pay | Admitting: Family Medicine

## 2022-09-12 ENCOUNTER — Encounter: Payer: Self-pay | Admitting: Family

## 2022-09-12 ENCOUNTER — Ambulatory Visit
Admission: RE | Admit: 2022-09-12 | Discharge: 2022-09-12 | Disposition: A | Discharge status: Home / Self Care | Payer: Federal, State, Local not specified - PPO | Attending: Family Medicine | Admitting: Family Medicine

## 2022-09-12 ENCOUNTER — Inpatient Hospital Stay (INDEPENDENT_AMBULATORY_CARE_PROVIDER_SITE_OTHER): Payer: Federal, State, Local not specified - PPO | Admitting: Radiology

## 2022-09-12 VITALS — BP 128/76 | HR 85 | Ht 63.0 in | Wt 161.0 lb

## 2022-09-12 DIAGNOSIS — G8929 Other chronic pain: Secondary | ICD-10-CM

## 2022-09-12 DIAGNOSIS — M1612 Unilateral primary osteoarthritis, left hip: Secondary | ICD-10-CM | POA: Diagnosis not present

## 2022-09-12 DIAGNOSIS — M25552 Pain in left hip: Secondary | ICD-10-CM | POA: Diagnosis not present

## 2022-09-12 DIAGNOSIS — M25562 Pain in left knee: Secondary | ICD-10-CM | POA: Diagnosis not present

## 2022-09-12 DIAGNOSIS — M533 Sacrococcygeal disorders, not elsewhere classified: Secondary | ICD-10-CM

## 2022-09-12 MED ORDER — TRIAMCINOLONE ACETONIDE 40 MG/ML IJ SUSP
120.0000 mg | Freq: Once | INTRAMUSCULAR | Status: AC
  Administered 2022-09-12: 120 mg via INTRAMUSCULAR

## 2022-09-12 NOTE — Patient Instructions (Signed)
You have just been given a cortisone injection to reduce pain and inflammation. After the injection you may notice immediate relief of pain as a result of the Lidocaine. It is important to rest the area of the injection for 24 to 48 hours after the injection. There is a possibility of some temporary increased discomfort and swelling for up to 72 hours until the cortisone begins to work. If you do have pain, simply rest the joint and use ice. If you can tolerate over the counter medications, you can try Tylenol for added relief per package instructions. -Remain out of work for 2 days and gradual return to normal activity - Apply ice at 20 intervals at the injection sites and as needed - Recommend touching base with pain/spine group to discuss left leg numbness - Research total hip orthopedic surgeons - Continue physical therapy - Contact for questions and follow-up as needed

## 2022-09-12 NOTE — Progress Notes (Signed)
Primary Care / Sports Medicine Office Visit  Patient Information:  Patient ID: Barbara Faulkner, female DOB: [birth year] Age: 57 y.o. MRN: [id number]   Barbara Faulkner is a pleasant 57 y.o. female presenting with the following:  Chief Complaint  Patient presents with   Hip Pain    LEFT Hip Pain follow-up, requests injection.    Vitals:   09/12/22 0907  BP: 128/76  Pulse: 85  SpO2: 95%   Vitals:   09/12/22 0907  Weight: 161 lb (73 kg)  Height: 5\' 3"  (1.6 m)   Body mass index is 28.52 kg/m.  No results found.   Independent interpretation of notes and tests performed by another provider:   None  Procedures performed:   Procedure:  Injection of left sacroiliac joint under ultrasound guidance. Ultrasound guidance utilized for in-plane approach medial to lateral, no sonographic abnormalities noted Samsung HS60 device utilized with permanent recording / reporting. Verbal informed consent obtained and verified. Skin prepped in a sterile fashion. Ethyl chloride for topical local analgesia.  Completed without difficulty and tolerated well. Medication: triamcinolone acetonide 40 mg/mL suspension for injection 1 mL total and 2 mL lidocaine 1% without epinephrine utilized for needle placement anesthetic Advised to contact for fevers/chills, erythema, induration, drainage, or persistent bleeding.  Procedure:  Injection of left greater trochanteric under ultrasound guidance. Ultrasound guidance utilized for out of plane approach to left greater trochanter, hypoechoic region consistent with bursitis noted Samsung HS60 device utilized with permanent recording / reporting. Verbal informed consent obtained and verified. Skin prepped in a sterile fashion. Ethyl chloride for topical local analgesia.  Completed without difficulty and tolerated well. Medication: triamcinolone acetonide 40 mg/mL suspension for injection 1 mL total and 2 mL lidocaine 1% without epinephrine utilized for  needle placement anesthetic Advised to contact for fevers/chills, erythema, induration, drainage, or persistent bleeding.  Procedure:  Injection of left hip joint under ultrasound guidance. Ultrasound guidance utilized for in-plane approach to left hip joint, cortical roughening consistent with osteoarthritis noted Samsung HS60 device utilized with permanent recording / reporting. Verbal informed consent obtained and verified. Skin prepped in a sterile fashion. Ethyl chloride for topical local analgesia.  Completed without difficulty and tolerated well. Medication: triamcinolone acetonide 40 mg/mL suspension for injection 1 mL total and 2 mL lidocaine 1% without epinephrine utilized for needle placement anesthetic Advised to contact for fevers/chills, erythema, induration, drainage, or persistent bleeding.   Pertinent History, Exam, Impression, and Recommendations:   [name] was seen today for hip pain.  Primary osteoarthritis of left hip Assessment & Plan: Chronic condition without adequate control.  Of note she did receive intra-articular corticosteroid injection during the 06/2022 timeframe with roughly 1 month of reported symptoms control.  She has been compliant with physical therapy, outside notes reviewed over the interim, and dosing her current medications.  Findings are consistent with focal left hip osteoarthritis related arthralgia and given current treatments and treatments to date, she did elect to proceed with ultrasound-guided left hip intra-articular corticosteroid injection.  I encouraged patient to seek out orthopedic surgical evaluation if limited corticosteroid response continues to be noted but otherwise continue with PT and medications.  Orders: -     [place] LIMITED JOINT SPACE STRUCTURES LOW LEFT; Future -     Triamcinolone Acetonide  Chronic left SI joint pain Assessment & Plan: Acute on chronic issue in setting of lumbosacral spondyloarthropathy and left hip  osteoarthritis with associated muscular compensatory pain.  This area was injected during the 06/2022 timeframe  with roughly 1 month of symptom improvement.  She has been compliant with physical therapy and is noting benefit from the same.  She is reporting intermittent left whole leg paresthesias, has previous established with pain and spine group.  Examination does show focal tenderness at the left sacroiliac joint, treatment strategies were reviewed and given her current medication regimen she did elect to proceed with ultrasound-guided left greater trochanteric cortisone injection.  Post care reviewed.  We did discuss that if diminishing response noted, surgical management would be considered optimal.  I have also encouraged patient to touch base with pain and spine group for further spine management options.  Orders: -     [place] LIMITED JOINT SPACE STRUCTURES LOW LEFT; Future -     Triamcinolone Acetonide  Greater 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 Assessment & Plan: Chronic condition that is still symptomatic in the setting of comorbid left hip osteoarthritis and lumbosacral spondyloarthropathy with concern for left-sided radiculopathy.  Focal tenderness at the greater trochanter for which she is elected to proceed with ultrasound-guided corticosteroid injection.  Orders: -     [place] LIMITED JOINT SPACE STRUCTURES LOW LEFT; Future -     Triamcinolone Acetonide  Chronic pain of left knee -     DG Knee Complete 4 Views Left; Future     Orders & Medications Meds ordered this encounter  Medications   triamcinolone acetonide (KENALOG-40) injection 120 mg   Orders Placed This Encounter  Procedures   [place] LIMITED JOINT SPACE STRUCTURES LOW LEFT   DG Knee Complete 4 Views Left     No follow-ups on file.     Montel Culver, MD, [facility]   Primary Care Sports Medicine Primary Care and Sports Medicine at [facility]

## 2022-09-12 NOTE — Assessment & Plan Note (Signed)
Chronic condition without adequate control.  Of note she did receive intra-articular corticosteroid injection during the 06/2022 timeframe with roughly 1 month of reported symptoms control.  She has been compliant with physical therapy, outside notes reviewed over the interim, and dosing her current medications.  Findings are consistent with focal left hip osteoarthritis related arthralgia and given current treatments and treatments to date, she did elect to proceed with ultrasound-guided left hip intra-articular corticosteroid injection.  I encouraged patient to seek out orthopedic surgical evaluation if limited corticosteroid response continues to be noted but otherwise continue with PT and medications.

## 2022-09-12 NOTE — Assessment & Plan Note (Addendum)
Chronic condition that is still symptomatic in the setting of comorbid left hip osteoarthritis and lumbosacral spondyloarthropathy with concern for left-sided radiculopathy.  Focal tenderness at the greater trochanter for which she is elected to proceed with ultrasound-guided corticosteroid injection.

## 2022-09-12 NOTE — Assessment & Plan Note (Addendum)
Acute on chronic issue in setting of lumbosacral spondyloarthropathy and left hip osteoarthritis with associated muscular compensatory pain.  This area was injected during the 06/2022 timeframe with roughly 1 month of symptom improvement.  She has been compliant with physical therapy and is noting benefit from the same.  She is reporting intermittent left whole leg paresthesias, has previous established with pain and spine group.  Examination does show focal tenderness at the left sacroiliac joint, treatment strategies were reviewed and given her current medication regimen she did elect to proceed with ultrasound-guided left greater trochanteric cortisone injection.  Post care reviewed.  We did discuss that if diminishing response noted, surgical management would be considered optimal.  I have also encouraged patient to touch base with pain and spine group for further spine management options.

## 2022-09-15 NOTE — Therapy (Incomplete)
OUTPATIENT PHYSICAL THERAPY HIP TREATMENT  Patient Name: Barbara Faulkner MRN: [id number] DOB:Jan 25, 1966, 57 y.o., female Today's Date: 09/15/2022    Past Medical History:  Diagnosis Date   Allergy    Anxiety    Chronic kidney disease    Chronic pain    Chronic sinusitis    COVID-19    09/14/20, 06/13/21   Depression    Heart murmur    Hiatal hernia    HPV (human papilloma virus) anogenital infection    Macromastia    Migraine    Psoriatic arthritis (HCC)    Shingles    UTI (urinary tract infection)    ecoli 09/2021   Past Surgical History:  Procedure Laterality Date   BREAST BIOPSY Right 01/28/2018   US guided biopsy - heart shaped   BREAST REDUCTION SURGERY Bilateral 04/03/2020   Procedure: MAMMARY REDUCTION  (BREAST);  Surgeon: Contogiannis, Chales Abrahams, MD;  Location: [facility];  Service: Plastics;  Laterality: Bilateral;  HAVE LIPOSUCTION MACHINE AVAILABLE   CHOLECYSTECTOMY  2009   COLONOSCOPY WITH PROPOFOL N/A 07/19/2018   Procedure: COLONOSCOPY WITH PROPOFOL;  Surgeon: Toney Reil, MD;  Location: [facility] ENDOSCOPY;  Service: Gastroenterology;  Laterality: N/A;   COLONOSCOPY WITH PROPOFOL N/A 09/17/2021   Procedure: COLONOSCOPY WITH PROPOFOL;  Surgeon: Toney Reil, MD;  Location: [facility] ENDOSCOPY;  Service: Gastroenterology;  Laterality: N/A;   COLONOSCOPY WITH PROPOFOL N/A 09/18/2021   Procedure: COLONOSCOPY WITH PROPOFOL;  Surgeon: Toney Reil, MD;  Location: [facility] ENDOSCOPY;  Service: Gastroenterology;  Laterality: N/A;   ESOPHAGOGASTRODUODENOSCOPY (EGD) WITH PROPOFOL N/A 07/19/2018   Procedure: ESOPHAGOGASTRODUODENOSCOPY (EGD) WITH PROPOFOL;  Surgeon: Toney Reil, MD;  Location: [facility] ENDOSCOPY;  Service: Gastroenterology;  Laterality: N/A;   GASTRIC BYPASS     2015; duodenal switch    LAPAROSCOPIC GASTRIC BANDING  2008   removed 2009   REDUCTION MAMMAPLASTY     TUBAL LIGATION  1997   Patient Active Problem List    Diagnosis Date Noted   Normocytic anemia 06/13/2022   Elevated alkaline phosphatase level 05/28/2022   Chills 05/26/2022   Chronic left SI joint pain 11/13/2021   Piriformis syndrome, left 11/13/2021   Greater 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 11/13/2021   Primary osteoarthritis of right hip 09/16/2021   Plantar fasciitis 08/22/2021   Carpal tunnel syndrome on right 08/22/2021   Primary osteoarthritis of left hip 08/15/2021   Hyperphosphatemia 08/01/2021   Anemia in chronic kidney disease 04/18/2021   Chronic kidney disease, stage 3a (HCC) 04/18/2021   Hyperparathyroidism due to renal insufficiency (HCC) 04/18/2021   Hypertension 04/18/2021   Hiatal hernia 11/02/2020   History of migraine 11/02/2020   Gastroesophageal reflux disease 11/02/2020   Brain fog 11/02/2020   Depression, recurrent (HCC) 03/18/2020   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 09/08/2018   Arthropathy of right shoulder 09/08/2018   Controlled substance agreement signed 09/08/2018   Chronic musculoskeletal pain 08/24/2018   Routine physical examination 08/24/2018   Iron deficiency anemia 08/24/2018   Elevated liver enzymes 07/19/2018   Vitamin D deficiency 07/19/2018   Colon cancer screening    Abdominal pain, epigastric 07/08/2018   Right shoulder pain 06/15/2018   Hemorrhoids 05/01/2017   Anxiety and depression 11/24/2016   Back pain 09/11/2016   Large breasts 09/11/2016   Parotiditis 05/01/2016   Abnormal laboratory test result 03/26/2016   Proteinuria 03/26/2016   Right hip pain 04/18/2015   Acute sinusitis 01/08/2014   Psoriatic arthritis (HCC) 09/13/2013  DUB (dysfunctional uterine bleeding) 10/27/2012   Gastritis due to nonsteroidal anti-inflammatory drug 12/12/2011   Anaphylactic reaction due to shellfish 09/27/2010   THYROMEGALY 09/03/2010   ALLERGIC RHINITIS 03/15/2010   MIGRAINE Elio Forget W/O INTRACT W/O STATUS MIGRNOSUS 05/17/2008   PCP: [name] FNP  REFERRING PROVIDER:  Rosette Reveal MD  REFERRING DIAG: [phone] (ICD-10-CM) - Primary osteoarthritis of left hip, M53.3,G89.29 (ICD-10-CM) - Chronic left SI joint pain, G57.02 (ICD-10-CM) - Piriformis syndrome, left, M25.552 (ICD-10-CM) - Greater 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  Rationale for Evaluation and Treatment: Rehabilitation  THERAPY DIAG: Pain in left hip  Muscle weakness (generalized)  ONSET DATE: 09/02/2015 (approximate)  FOLLOW-UP APPT SCHEDULED WITH REFERRING PROVIDER: Yes   FROM INITIAL EVALUATION SUBJECTIVE:                                                                                                                                                                                         SUBJECTIVE STATEMENT: L hip pain  PERTINENT HISTORY:  Pt reports a diagnosis of Lyme Disease in 2017 with persistent widespread chronic joint pain after treatment.  Of note she has severe L hip pain as well as pain in her L knee and right shoulder. Her L hip pain has been worsening recently. She denies any pain in her R hip or L shoulder. She was previously receiving L hip injections from Dr. Mack Guise however she reports that he refused to continue with further injections so she was referred by pain management to Dr. Zigmund Daniel. She saw Dr. Zigmund Daniel and pt received a L hip intraarticular injection as well as an injection in her L SIJ and L greater trochanter. The injections have helped with her pain. She was also referred to an orthopedic surgeon to discuss THR and referred for physical therapy. Pt expresses that she really enjoys exercising but is very limited by her pain.   PAIN:  Pain Intensity: Present: 4/10, Best: 1/10, Worst: 10/10 Pain location: L groin, L greater trochanter, and L posterior hip Pain Quality: sharp  Radiating: Yes, down the posterior L thigh Numbness/Tingling: Yes, tingling in R hand Focal Weakness: Yes, weakness related to pain Position of comfort: laying down (supine  and R sidelying), occasionally pressure on L hip is helpful Aggravating factors: extended standing, extended sitting,  Relieving factors: exiting the bed on the right side, "I hate ice but it works," hot shower/bath helps, massage, stretches, deep squat, medication (takes at night for sleep),  24-hour pain behavior: worse at the end of the day How long can you sit: 10 minutes How long can you stand: 8-10 hours History of prior back injury, pain, surgery,  or therapy: No Dominant hand: right Imaging: Yes, IMPRESSION: 1. 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 2. Moderate pubic symphysis osteoarthritis. Red flags: Negative for bowel/bladder changes, saddle paresthesia, personal history of cancer, h/o spinal tumors, h/o compression fx, h/o abdominal aneurysm, abdominal pain, chills/fever, night sweats, nausea, vomiting, unrelenting pain,   PRECAUTIONS: None  WEIGHT BEARING RESTRICTIONS: No  FALLS: Has patient fallen in last 6 months? No  Living Environment Lives with: lives with their spouse Lives in: House/apartment Stairs: Yes: Internal: 12 steps; on right going up Has following equipment at home: Single point cane, Walker - 2 wheeled, and Shower bench  Prior level of function: Independent  Occupational demands: works at [employer], standing for 8-10 hour shifts  Hobbies: Exercising (doesn't like water aerobics)  Patient Goals: Return to exercising.   OBJECTIVE:   Patient Surveys  FOTO 52, predicted improvement to 32  Cognition Patient is oriented to person, place, and time.  Recent memory is intact.  Remote memory is intact.  Attention span and concentration are intact.  Expressive speech is intact.  Patient's fund of knowledge is within normal limits for educational level.    Gross Musculoskeletal Assessment Tremor: None Bulk: Normal Tone: Normal  GAIT: Antalgic gait on LLE but full gait assessment  deferred  Posture: Lumbar lordosis: WNL Iliac crest height: Equal bilaterally Lumbar lateral shift: Negative  AROM AROM (Normal range in degrees) AROM   Lumbar   Flexion (65) WNL  Extension (30) Mild limitation with L hip pain during overpressure  Right lateral flexion (25) Mild limitation with L hip pain  Left lateral flexion (25) WNL with mild L hip pain  Right rotation (30)   Left rotation (30)       Hip Right Left  Flexion (125) WNL Limited around 90 degrees  Extension (15)    Abduction (40)    Adduction     Internal Rotation (45) To be measured To be measured  External Rotation (45) To be measured To be measured      Knee    Flexion (135) WNL WNL  Extension (0) 0 0      Ankle    Dorsiflexion (20)    Plantarflexion (50)    Inversion (35)    Eversion (15    (* = pain; Blank rows = not tested)  LE MMT: MMT (out of 5) Right  Left   Hip flexion 5 4+*  Hip extension    Hip abduction 4+ 4*  Hip adduction 4+ 4*  Hip internal rotation 5 5  Hip external rotation 5 4*  Knee flexion 4+ 4+*  Knee extension 5 5  Ankle dorsiflexion 5 5  Ankle plantarflexion    Ankle inversion    Ankle eversion    (* = pain; Blank rows = not tested)  Sensation Deferred  Reflexes Deferred   Muscle Length Hamstrings: R: Positive for shortening around 70 degrees L: Positive for shortening around 70 degrees Ely (quadriceps): R: Not examined L: Not examined Thomas (hip flexors): R: Not examined L: Not examined Ober: R: Not examined L: Not examined  Palpation Location Right Left         Lumbar paraspinals 1 1  Quadratus Lumborum    Gluteus Maximus 1 2  Gluteus Medius 2 2  Deep hip external rotators 2 2  PSIS 1 2  Fortin's Area (SIJ) 0 0  Greater Trochanter 0 0  (Blank rows = not tested) Graded on 0-4 scale (0 = no pain,  1 = pain, 2 = pain with wincing/grimacing/flinching, 3 = pain with withdrawal, 4 = unwilling to allow palpation)  Passive Accessory Intervertebral  Motion Generally hypomobile throughout lumbar spine with increased tenderness in lower lumbar segments  Special Tests Lumbar Radiculopathy and Discogenic: Centralization and Peripheralization (SN 92, -LR 0.12): Not examined Slump (SN 83, -LR 0.32): R: Negative L: Negative SLR (SN 92, -LR 0.29): R: Negative L:  Negative Crossed SLR (SP 90): R: Negative L: Negative  Facet Joint: Extension-Rotation (SN 100, -LR 0.0): R: Negative L: Negative  Lumbar Foraminal Stenosis: Lumbar quadrant (SN 70): R: Negative L: Negative  Hip: FABER (SN 81): R: Negative L: Positive FADIR (SN 94): R: Negative L: Positive Hip scour (SN 50): R: Negative L: Negative  SIJ:  Thigh Thrust (SN 88, -LR 0.18) : R: Not examined L: Not examined  Piriformis Syndrome: FAIR Test (SN 88, SP 83): R: Not examined L: Not examined  Functional Tasks: Deferred  Beighton scale: Deferred   TODAY'S TREATMENT   SUBJECTIVE: Patient's reports her hip pain has been bad recently. She is scheduled for her L hip injection tomorrow. No specific questions currently;   PAIN: 8.5/10 L hip pain   Ther-ex  NuStep Level 0-2 x 7 minutes for warm-up during interval history (2 minutes unbilled); Hooklying clams with manual resistance 2 x 15; Hooklying adductor squeeze with manual resistance 2 x 15; Supine L hip straight leg abduction/adduction with manual resistance x 10 each; Supine LLE heel slide with manually resisted extension with pt pushing into therapist x 10;   Manual Therapy  Supine L hip AP mobilizations at neutral, grade I, 30s/bout x 3 bouts; Supine L hip long axis gentle distraction with belt assist, 30s/bout x 3 bouts; Supine L hip inferior mobilizations with belt assist and hip flexed to 90 degrees 30s/bout x 3 bouts; Supine L hip gentle medial to lateral mobilizations with hip flexed to 45 degrees, belt assist, 30s/bout x 3 bouts; Supine L hip gentle medial to lateral/inferior mobilizations with hip flexed to 45  degrees and slightly externally rotated within comfortable range, belt assist, 30s/bout x 3 bouts;   Not Performed: Total Gym (TG) Level 22 (L20) double leg squats holding 2, 4# dumbbells (DB) x 15; TG L22 single leg squats holding 2, 4# DB 2 x 10 BLE; TG L22 double leg heel raises holding 2, 4# DB x 20; Nautilus resisted gait forward, backward, R lateral, and L lateral 125# x 3 each direction;   PATIENT EDUCATION:  Education details: Pt educated throughout session about proper posture and technique with exercises. Improved exercise technique, movement at target joints, use of target muscles after min to mod verbal, visual, tactile cues, Therapeutic neuroscience pain education; Person educated: Patient Education method: Explanation and handout Education comprehension: verbalized understanding   HOME EXERCISE PROGRAM:  Access Code: [email or web address] URL: [email or web address] Date: 07/17/2022 Prepared by: Roxana Hires  Exercises - Hooklying Lumbar Rotation  - 1 x daily - 7 x weekly - 2 sets - 10 reps - 3s hold - Supine March  - 1 x daily - 7 x weekly - 2 sets - 10 reps - 3s hold - Supine Bridge  - 1 x daily - 7 x weekly - 2 sets - 10 reps - 3s hold   ASSESSMENT:  CLINICAL IMPRESSION: Continued light manual techniques to L hip for pain modulation, neural stimulation, and tissue extensibility. Performed only light mat table strengthening due to highly irritable hip pain today. She is scheduled for a hip injection  tomorrow. Deferred updating outcome measures/goals due to high level of pain today but will update at next session. No HEP modifications on this date. Encouraged pt to follow-up as scheduled. She will benefit from PT services to address deficits in strength, range of motion, and pain in order to improve pain with household and work responsibilities.   OBJECTIVE IMPAIRMENTS: Abnormal gait, decreased ROM, decreased strength, and pain.   ACTIVITY LIMITATIONS: lifting,  bending, standing, and squatting  PARTICIPATION LIMITATIONS: cleaning, shopping, community activity, and occupation  PERSONAL FACTORS: Past/current experiences, Time since onset of injury/illness/exacerbation, and 3+ comorbidities: depression, anxiety, migraines, chronic pain, and psoriatic arthritis  are also affecting patient's functional outcome.   REHAB POTENTIAL: Fair    CLINICAL DECISION MAKING: Unstable/unpredictable  EVALUATION COMPLEXITY: High   GOALS: Goals reviewed with patient? No  SHORT TERM GOALS: Target date: 10/09/2022   Pt will be independent with HEP in order to improve strength and range of motion as well as decrease hip pain to improve function at home and work. Baseline:  Goal status: INITIAL   LONG TERM GOALS: Target date: 11/06/2022  Pt will increase FOTO to at least 66 to demonstrate significant improvement in function at home and work related to L hip pain  Baseline: 07/08/22: 52 Goal status: INITIAL  2.  Pt will decrease worst hip pain by at least 2 points on the NPRS in order to demonstrate clinically significant reduction in hip pain. Baseline: 07/08/22: worst: 10/10 Goal status: INITIAL  3.  Pt will report at least 25% improvement in L hip symptoms in order to demonstrate clinically significant reduction in hip pain/disability so she can return to increased frequency of exercise with less pain       Baseline:  Goal status: INITIAL  4.  Pt will increase pain-free strength of L hip abduction/adduction by at least 1/2 MMT grade in order to demonstrate improvement in strength and function  Baseline: 07/08/22: 4/5 both with pain; Goal status: INITIAL   PLAN: PT FREQUENCY: 1-2x/week  PT DURATION: 8 weeks  PLANNED INTERVENTIONS: Therapeutic exercises, Therapeutic activity, Neuromuscular re-education, Balance training, Gait training, Patient/Family education, Self Care, Joint mobilization, Joint manipulation, Vestibular training, Canalith repositioning,  Orthotic/Fit training, DME instructions, Dry Needling, Electrical stimulation, Spinal manipulation, Spinal mobilization, Cryotherapy, Moist heat, Taping, Traction, Ultrasound, Ionotophoresis 4mg /ml Dexamethasone, Manual therapy, and Re-evaluation.  PLAN FOR NEXT SESSION: update outcome measures/goals, continue low level strengthening, therapeutic neuroscience education, review/modify HEP as needed   Kateria Cutrona PT, DPT, GCS  Oberia Beaudoin, PT 09/15/2022, 10:48 AM

## 2022-09-16 ENCOUNTER — Ambulatory Visit: Payer: Federal, State, Local not specified - PPO

## 2022-09-16 DIAGNOSIS — M25552 Pain in left hip: Secondary | ICD-10-CM

## 2022-09-16 DIAGNOSIS — M6281 Muscle weakness (generalized): Secondary | ICD-10-CM

## 2022-09-23 ENCOUNTER — Ambulatory Visit: Payer: Federal, State, Local not specified - PPO

## 2022-09-24 NOTE — Therapy (Incomplete)
OUTPATIENT PHYSICAL THERAPY HIP TREATMENT  Patient Name: Barbara Faulkner MRN: [id number] DOB:11-12-1965, 57 y.o., female Today's Date: 09/24/2022    Past Medical History:  Diagnosis Date   Allergy    Anxiety    Chronic kidney disease    Chronic pain    Chronic sinusitis    COVID-19    09/14/20, 06/13/21   Depression    Heart murmur    Hiatal hernia    HPV (human papilloma virus) anogenital infection    Macromastia    Migraine    Psoriatic arthritis ([place])    Shingles    UTI (urinary tract infection)    ecoli 09/2021   Past Surgical History:  Procedure Laterality Date   BREAST BIOPSY Right 01/28/2018   US guided biopsy - heart shaped   BREAST REDUCTION SURGERY Bilateral 04/03/2020   Procedure: MAMMARY REDUCTION  (BREAST);  Surgeon: Contogiannis, Audrea Muscat, MD;  Location: [place];  Service: Plastics;  Laterality: Bilateral;  HAVE LIPOSUCTION MACHINE AVAILABLE   CHOLECYSTECTOMY  2009   COLONOSCOPY WITH PROPOFOL N/A 07/19/2018   Procedure: COLONOSCOPY WITH PROPOFOL;  Surgeon: Lin Landsman, MD;  Location: [facility] ENDOSCOPY;  Service: Gastroenterology;  Laterality: N/A;   COLONOSCOPY WITH PROPOFOL N/A 09/17/2021   Procedure: COLONOSCOPY WITH PROPOFOL;  Surgeon: Lin Landsman, MD;  Location: [facility] ENDOSCOPY;  Service: Gastroenterology;  Laterality: N/A;   COLONOSCOPY WITH PROPOFOL N/A 09/18/2021   Procedure: COLONOSCOPY WITH PROPOFOL;  Surgeon: Lin Landsman, MD;  Location: [facility] ENDOSCOPY;  Service: Gastroenterology;  Laterality: N/A;   ESOPHAGOGASTRODUODENOSCOPY (EGD) WITH PROPOFOL N/A 07/19/2018   Procedure: ESOPHAGOGASTRODUODENOSCOPY (EGD) WITH PROPOFOL;  Surgeon: Lin Landsman, MD;  Location: [facility] ENDOSCOPY;  Service: Gastroenterology;  Laterality: N/A;   GASTRIC BYPASS     2015; duodenal switch    LAPAROSCOPIC GASTRIC BANDING  2008   removed 2009   [place]   Patient Active Problem List    Diagnosis Date Noted   Normocytic anemia 06/13/2022   Elevated alkaline phosphatase level 05/28/2022   Chills 05/26/2022   Chronic left SI joint pain 11/13/2021   Piriformis syndrome, left 11/13/2021   Greater 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 11/13/2021   Primary osteoarthritis of right hip 09/16/2021   Plantar fasciitis 08/22/2021   Carpal tunnel syndrome on right 08/22/2021   Primary osteoarthritis of left hip 08/15/2021   Hyperphosphatemia 08/01/2021   Anemia in chronic kidney disease 04/18/2021   Chronic kidney disease, stage 3a ([place]) 04/18/2021   Hyperparathyroidism due to renal insufficiency ([place]) 04/18/2021   Hypertension 04/18/2021   Hiatal hernia 11/02/2020   History of migraine 11/02/2020   Gastroesophageal reflux disease 11/02/2020   Brain fog 11/02/2020   Depression, recurrent ([place]) 03/18/2020   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 09/08/2018   Arthropathy of right shoulder 09/08/2018   Controlled substance agreement signed 09/08/2018   Chronic musculoskeletal pain 08/24/2018   Routine physical examination 08/24/2018   Iron deficiency anemia 08/24/2018   Elevated liver enzymes 07/19/2018   Vitamin D deficiency 07/19/2018   Colon cancer screening    Abdominal pain, epigastric 07/08/2018   Right shoulder pain 06/15/2018   Hemorrhoids 05/01/2017   Anxiety and depression 11/24/2016   Back pain 09/11/2016   Large breasts 09/11/2016   Parotiditis 05/01/2016   Abnormal laboratory test result 03/26/2016   Proteinuria 03/26/2016   Right hip pain 04/18/2015   Acute sinusitis 01/08/2014   Psoriatic arthritis ([place]) 09/13/2013  DUB (dysfunctional uterine bleeding) 10/27/2012   Gastritis due to nonsteroidal anti-inflammatory drug 12/12/2011   Anaphylactic reaction due to shellfish 09/27/2010   THYROMEGALY 09/03/2010   ALLERGIC RHINITIS 03/15/2010   MIGRAINE Elio Forget W/O INTRACT W/O STATUS MIGRNOSUS 05/17/2008   PCP: [name] FNP  REFERRING PROVIDER:  Rosette Reveal MD  REFERRING DIAG: [phone] (ICD-10-CM) - Primary osteoarthritis of left hip, M53.3,G89.29 (ICD-10-CM) - Chronic left SI joint pain, G57.02 (ICD-10-CM) - Piriformis syndrome, left, M25.552 (ICD-10-CM) - Greater 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  Rationale for Evaluation and Treatment: Rehabilitation  THERAPY DIAG: Pain in left hip  Muscle weakness (generalized)  ONSET DATE: 09/02/2015 (approximate)  FOLLOW-UP APPT SCHEDULED WITH REFERRING PROVIDER: Yes   FROM INITIAL EVALUATION SUBJECTIVE:                                                                                                                                                                                         SUBJECTIVE STATEMENT: L hip pain  PERTINENT HISTORY:  Pt reports a diagnosis of Lyme Disease in 2017 with persistent widespread chronic joint pain after treatment.  Of note she has severe L hip pain as well as pain in her L knee and right shoulder. Her L hip pain has been worsening recently. She denies any pain in her R hip or L shoulder. She was previously receiving L hip injections from Dr. Mack Guise however she reports that he refused to continue with further injections so she was referred by pain management to Dr. Zigmund Daniel. She saw Dr. Zigmund Daniel and pt received a L hip intraarticular injection as well as an injection in her L SIJ and L greater trochanter. The injections have helped with her pain. She was also referred to an orthopedic surgeon to discuss THR and referred for physical therapy. Pt expresses that she really enjoys exercising but is very limited by her pain.   PAIN:  Pain Intensity: Present: 4/10, Best: 1/10, Worst: 10/10 Pain location: L groin, L greater trochanter, and L posterior hip Pain Quality: sharp  Radiating: Yes, down the posterior L thigh Numbness/Tingling: Yes, tingling in R hand Focal Weakness: Yes, weakness related to pain Position of comfort: laying down (supine  and R sidelying), occasionally pressure on L hip is helpful Aggravating factors: extended standing, extended sitting,  Relieving factors: exiting the bed on the right side, "I hate ice but it works," hot shower/bath helps, massage, stretches, deep squat, medication (takes at night for sleep),  24-hour pain behavior: worse at the end of the day How long can you sit: 10 minutes How long can you stand: 8-10 hours History of prior back injury, pain, surgery,  or therapy: No Dominant hand: right Imaging: Yes, IMPRESSION: 1. 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 2. Moderate pubic symphysis osteoarthritis. Red flags: Negative for bowel/bladder changes, saddle paresthesia, personal history of cancer, h/o spinal tumors, h/o compression fx, h/o abdominal aneurysm, abdominal pain, chills/fever, night sweats, nausea, vomiting, unrelenting pain,   PRECAUTIONS: None  WEIGHT BEARING RESTRICTIONS: No  FALLS: Has patient fallen in last 6 months? No  Living Environment Lives with: lives with their spouse Lives in: House/apartment Stairs: Yes: Internal: 12 steps; on right going up Has following equipment at home: Single point cane, Walker - 2 wheeled, and Shower bench  Prior level of function: Independent  Occupational demands: works at [employer], standing for 8-10 hour shifts  Hobbies: Exercising (doesn't like water aerobics)  Patient Goals: Return to exercising.   OBJECTIVE:   Patient Surveys  FOTO 52, predicted improvement to 32  Cognition Patient is oriented to person, place, and time.  Recent memory is intact.  Remote memory is intact.  Attention span and concentration are intact.  Expressive speech is intact.  Patient's fund of knowledge is within normal limits for educational level.    Gross Musculoskeletal Assessment Tremor: None Bulk: Normal Tone: Normal  GAIT: Antalgic gait on LLE but full gait assessment  deferred  Posture: Lumbar lordosis: WNL Iliac crest height: Equal bilaterally Lumbar lateral shift: Negative  AROM AROM (Normal range in degrees) AROM   Lumbar   Flexion (65) WNL  Extension (30) Mild limitation with L hip pain during overpressure  Right lateral flexion (25) Mild limitation with L hip pain  Left lateral flexion (25) WNL with mild L hip pain  Right rotation (30)   Left rotation (30)       Hip Right Left  Flexion (125) WNL Limited around 90 degrees  Extension (15)    Abduction (40)    Adduction     Internal Rotation (45) To be measured To be measured  External Rotation (45) To be measured To be measured      Knee    Flexion (135) WNL WNL  Extension (0) 0 0      Ankle    Dorsiflexion (20)    Plantarflexion (50)    Inversion (35)    Eversion (15    (* = pain; Blank rows = not tested)  LE MMT: MMT (out of 5) Right  Left   Hip flexion 5 4+*  Hip extension    Hip abduction 4+ 4*  Hip adduction 4+ 4*  Hip internal rotation 5 5  Hip external rotation 5 4*  Knee flexion 4+ 4+*  Knee extension 5 5  Ankle dorsiflexion 5 5  Ankle plantarflexion    Ankle inversion    Ankle eversion    (* = pain; Blank rows = not tested)  Sensation Deferred  Reflexes Deferred   Muscle Length Hamstrings: R: Positive for shortening around 70 degrees L: Positive for shortening around 70 degrees [place] (quadriceps): R: Not examined L: Not examined Thomas (hip flexors): R: Not examined L: Not examined Ober: R: Not examined L: Not examined  Palpation Location Right Left         Lumbar paraspinals 1 1  Quadratus Lumborum    Gluteus Maximus 1 2  Gluteus Medius 2 2  Deep hip external rotators 2 2  PSIS 1 2  Fortin's Area (SIJ) 0 0  Greater Trochanter 0 0  (Blank rows = not tested) Graded on 0-4 scale (0 = no pain,  1 = pain, 2 = pain with wincing/grimacing/flinching, 3 = pain with withdrawal, 4 = unwilling to allow palpation)  Passive Accessory Intervertebral  Motion Generally hypomobile throughout lumbar spine with increased tenderness in lower lumbar segments  Special Tests Lumbar Radiculopathy and Discogenic: Centralization and Peripheralization (SN 92, -LR 0.12): Not examined Slump (SN 83, -LR 0.32): R: Negative L: Negative SLR (SN 92, -LR 0.29): R: Negative L:  Negative Crossed SLR (SP 90): R: Negative L: Negative  Facet Joint: Extension-Rotation (SN 100, -LR 0.0): R: Negative L: Negative  Lumbar Foraminal Stenosis: Lumbar quadrant (SN 70): R: Negative L: Negative  Hip: FABER (SN 81): R: Negative L: Positive FADIR (SN 94): R: Negative L: Positive Hip scour (SN 50): R: Negative L: Negative  SIJ:  Thigh Thrust (SN 88, -LR 0.18) : R: Not examined L: Not examined  Piriformis Syndrome: FAIR Test (SN 88, SP 83): R: Not examined L: Not examined  Functional Tasks: Deferred  Beighton scale: Deferred   TODAY'S TREATMENT   SUBJECTIVE: Patient's reports her hip pain has been bad recently. She is scheduled for her L hip injection tomorrow. No specific questions currently;   PAIN: 8.5/10 L hip pain   Ther-ex  NuStep Level 0-2 x 7 minutes for warm-up during interval history (2 minutes unbilled); Hooklying clams with manual resistance 2 x 15; Hooklying adductor squeeze with manual resistance 2 x 15; Supine L hip straight leg abduction/adduction with manual resistance x 10 each; Supine LLE heel slide with manually resisted extension with pt pushing into therapist x 10;   Manual Therapy  Supine L hip AP mobilizations at neutral, grade I, 30s/bout x 3 bouts; Supine L hip long axis gentle distraction with belt assist, 30s/bout x 3 bouts; Supine L hip inferior mobilizations with belt assist and hip flexed to 90 degrees 30s/bout x 3 bouts; Supine L hip gentle medial to lateral mobilizations with hip flexed to 45 degrees, belt assist, 30s/bout x 3 bouts; Supine L hip gentle medial to lateral/inferior mobilizations with hip flexed to 45  degrees and slightly externally rotated within comfortable range, belt assist, 30s/bout x 3 bouts;   Not Performed: Total Gym (TG) Level 22 (L20) double leg squats holding 2, 4# dumbbells (DB) x 15; TG L22 single leg squats holding 2, 4# DB 2 x 10 BLE; TG L22 double leg heel raises holding 2, 4# DB x 20; Nautilus resisted gait forward, backward, R lateral, and L lateral 125# x 3 each direction;   PATIENT EDUCATION:  Education details: Pt educated throughout session about proper posture and technique with exercises. Improved exercise technique, movement at target joints, use of target muscles after min to mod verbal, visual, tactile cues, Therapeutic neuroscience pain education; Person educated: Patient Education method: Explanation and handout Education comprehension: verbalized understanding   HOME EXERCISE PROGRAM:  Access Code: [email or web address] URL: [email or web address] Date: 07/17/2022 Prepared by: Roxana Hires  Exercises - Hooklying Lumbar Rotation  - 1 x daily - 7 x weekly - 2 sets - 10 reps - 3s hold - Supine March  - 1 x daily - 7 x weekly - 2 sets - 10 reps - 3s hold - Supine Bridge  - 1 x daily - 7 x weekly - 2 sets - 10 reps - 3s hold   ASSESSMENT:  CLINICAL IMPRESSION: Continued light manual techniques to L hip for pain modulation, neural stimulation, and tissue extensibility. Performed only light mat table strengthening due to highly irritable hip pain today. She is scheduled for a hip injection  tomorrow. Deferred updating outcome measures/goals due to high level of pain today but will update at next session. No HEP modifications on this date. Encouraged pt to follow-up as scheduled. She will benefit from PT services to address deficits in strength, range of motion, and pain in order to improve pain with household and work responsibilities.   OBJECTIVE IMPAIRMENTS: Abnormal gait, decreased ROM, decreased strength, and pain.   ACTIVITY LIMITATIONS: lifting,  bending, standing, and squatting  PARTICIPATION LIMITATIONS: cleaning, shopping, community activity, and occupation  PERSONAL FACTORS: Past/current experiences, Time since onset of injury/illness/exacerbation, and 3+ comorbidities: depression, anxiety, migraines, chronic pain, and psoriatic arthritis  are also affecting patient's functional outcome.   REHAB POTENTIAL: Fair    CLINICAL DECISION MAKING: Unstable/unpredictable  EVALUATION COMPLEXITY: High   GOALS: Goals reviewed with patient? No  SHORT TERM GOALS: Target date: 10/09/2022   Pt will be independent with HEP in order to improve strength and range of motion as well as decrease hip pain to improve function at home and work. Baseline:  Goal status: INITIAL   LONG TERM GOALS: Target date: 11/06/2022  Pt will increase FOTO to at least 66 to demonstrate significant improvement in function at home and work related to L hip pain  Baseline: 07/08/22: 52 Goal status: INITIAL  2.  Pt will decrease worst hip pain by at least 2 points on the NPRS in order to demonstrate clinically significant reduction in hip pain. Baseline: 07/08/22: worst: 10/10 Goal status: INITIAL  3.  Pt will report at least 25% improvement in L hip symptoms in order to demonstrate clinically significant reduction in hip pain/disability so she can return to increased frequency of exercise with less pain       Baseline:  Goal status: INITIAL  4.  Pt will increase pain-free strength of L hip abduction/adduction by at least 1/2 MMT grade in order to demonstrate improvement in strength and function  Baseline: 07/08/22: 4/5 both with pain; Goal status: INITIAL   PLAN: PT FREQUENCY: 1-2x/week  PT DURATION: 8 weeks  PLANNED INTERVENTIONS: Therapeutic exercises, Therapeutic activity, Neuromuscular re-education, Balance training, Gait training, Patient/Family education, Self Care, Joint mobilization, Joint manipulation, Vestibular training, Canalith repositioning,  Orthotic/Fit training, DME instructions, Dry Needling, Electrical stimulation, Spinal manipulation, Spinal mobilization, Cryotherapy, Moist heat, Taping, Traction, Ultrasound, Ionotophoresis 4mg /ml Dexamethasone, Manual therapy, and Re-evaluation.  PLAN FOR NEXT SESSION: update outcome measures/goals, continue low level strengthening, therapeutic neuroscience education, review/modify HEP as needed   Salvador Bigbee PT, DPT, GCS  Jowell Bossi, PT 09/24/2022, 1:04 PM

## 2022-09-25 ENCOUNTER — Ambulatory Visit: Payer: Federal, State, Local not specified - PPO

## 2022-09-25 DIAGNOSIS — M6281 Muscle weakness (generalized): Secondary | ICD-10-CM | POA: Diagnosis not present

## 2022-09-25 DIAGNOSIS — M25552 Pain in left hip: Secondary | ICD-10-CM | POA: Diagnosis not present

## 2022-09-25 NOTE — Therapy (Signed)
OUTPATIENT PHYSICAL THERAPY HIP TREATMENT/RECERTIFICATION  Patient Name: Barbara Faulkner MRN: [id number] DOB:11-03-1965, 57 y.o., female Today's Date: 09/25/2022   PT End of Session - 09/25/22 1453     Visit Number 12    Number of Visits 33    Date for PT Re-Evaluation 11/06/22    Authorization Type BCBS Federal PPO    Authorization Time Period 09/11/22-11/06/22    Progress Note Due on Visit 20    PT Start Time 1450    PT Stop Time 1530    PT Time Calculation (min) 40 min    Activity Tolerance Patient tolerated treatment well;No increased pain    Behavior During Therapy [facility] for tasks assessed/performed            Past Medical History:  Diagnosis Date   Allergy    Anxiety    Chronic kidney disease    Chronic pain    Chronic sinusitis    COVID-19    09/14/20, 06/13/21   Depression    Heart murmur    Hiatal hernia    HPV (human papilloma virus) anogenital infection    Macromastia    Migraine    Psoriatic arthritis ([place])    Shingles    UTI (urinary tract infection)    ecoli 09/2021   Past Surgical History:  Procedure Laterality Date   BREAST BIOPSY Right 01/28/2018   US guided biopsy - heart shaped   BREAST REDUCTION SURGERY Bilateral 04/03/2020   Procedure: MAMMARY REDUCTION  (BREAST);  Surgeon: Contogiannis, Audrea Muscat, MD;  Location: [place];  Service: Plastics;  Laterality: Bilateral;  HAVE LIPOSUCTION MACHINE AVAILABLE   CHOLECYSTECTOMY  2009   COLONOSCOPY WITH PROPOFOL N/A 07/19/2018   Procedure: COLONOSCOPY WITH PROPOFOL;  Surgeon: Lin Landsman, MD;  Location: [facility] ENDOSCOPY;  Service: Gastroenterology;  Laterality: N/A;   COLONOSCOPY WITH PROPOFOL N/A 09/17/2021   Procedure: COLONOSCOPY WITH PROPOFOL;  Surgeon: Lin Landsman, MD;  Location: [facility] ENDOSCOPY;  Service: Gastroenterology;  Laterality: N/A;   COLONOSCOPY WITH PROPOFOL N/A 09/18/2021   Procedure: COLONOSCOPY WITH PROPOFOL;  Surgeon: Lin Landsman, MD;  Location: [facility]  ENDOSCOPY;  Service: Gastroenterology;  Laterality: N/A;   ESOPHAGOGASTRODUODENOSCOPY (EGD) WITH PROPOFOL N/A 07/19/2018   Procedure: ESOPHAGOGASTRODUODENOSCOPY (EGD) WITH PROPOFOL;  Surgeon: Lin Landsman, MD;  Location: [facility] ENDOSCOPY;  Service: Gastroenterology;  Laterality: N/A;   GASTRIC BYPASS     2015; duodenal switch    LAPAROSCOPIC GASTRIC BANDING  2008   removed 2009   [place]   Patient Active Problem List   Diagnosis Date Noted   Normocytic anemia 06/13/2022   Elevated alkaline phosphatase level 05/28/2022   Chills 05/26/2022   Chronic left SI joint pain 11/13/2021   Piriformis syndrome, left 11/13/2021   Greater 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 11/13/2021   Primary osteoarthritis of right hip 09/16/2021   Plantar fasciitis 08/22/2021   Carpal tunnel syndrome on right 08/22/2021   Primary osteoarthritis of left hip 08/15/2021   Hyperphosphatemia 08/01/2021   Anemia in chronic kidney disease 04/18/2021   Chronic kidney disease, stage 3a ([place]) 04/18/2021   Hyperparathyroidism due to renal insufficiency ([place]) 04/18/2021   Hypertension 04/18/2021   Hiatal hernia 11/02/2020   History of migraine 11/02/2020   Gastroesophageal reflux disease 11/02/2020   Brain fog 11/02/2020   Depression, recurrent ([place]) 03/18/2020   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 09/08/2018   Arthropathy of right shoulder 09/08/2018  Controlled substance agreement signed 09/08/2018   Chronic musculoskeletal pain 08/24/2018   Routine physical examination 08/24/2018   Iron deficiency anemia 08/24/2018   Elevated liver enzymes 07/19/2018   Vitamin D deficiency 07/19/2018   Colon cancer screening    Abdominal pain, epigastric 07/08/2018   Right shoulder pain 06/15/2018   Hemorrhoids 05/01/2017   Anxiety and depression 11/24/2016   Back pain 09/11/2016   Large breasts 09/11/2016   Parotiditis 05/01/2016   Abnormal laboratory test result  03/26/2016   Proteinuria 03/26/2016   Right hip pain 04/18/2015   Acute sinusitis 01/08/2014   Psoriatic arthritis (HCC) 09/13/2013   DUB (dysfunctional uterine bleeding) 10/27/2012   Gastritis due to nonsteroidal anti-inflammatory drug 12/12/2011   Anaphylactic reaction due to shellfish 09/27/2010   THYROMEGALY 09/03/2010   ALLERGIC RHINITIS 03/15/2010   MIGRAINE [name] 05/17/2008   PCP: Rennie PlowmanMargaret Arnett FNP  REFERRING PROVIDER: Joseph BerkshireJason Matthews MD  REFERRING DIAG: [phone] (ICD-10-CM) - Primary osteoarthritis of left hip, M53.3,G89.29 (ICD-10-CM) - Chronic left SI joint pain, G57.02 (ICD-10-CM) - Piriformis syndrome, left, M25.552 (ICD-10-CM) - Greater 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  Rationale for Evaluation and Treatment: Rehabilitation  THERAPY DIAG: Pain in left hip  Muscle weakness (generalized)  ONSET DATE: 09/02/2015 (approximate)  FOLLOW-UP APPT SCHEDULED WITH REFERRING PROVIDER: Yes   FROM INITIAL EVALUATION SUBJECTIVE:                                                                                                                                                                                         SUBJECTIVE STATEMENT: L hip pain  PERTINENT HISTORY:  Pt reports a diagnosis of Lyme Disease in 2017 with persistent widespread chronic joint pain after treatment.  Of note she has severe L hip pain as well as pain in her L knee and right shoulder. Her L hip pain has been worsening recently. She denies any pain in her R hip or L shoulder. She was previously receiving L hip injections from Dr. [name] however she reports that he refused to continue with further injections so she was referred by pain management to Dr. [name]. She saw Dr. [name] and pt received a L hip intraarticular injection as well as an injection in her L SIJ and L greater trochanter. The injections have helped with her pain. She was also referred to an  orthopedic surgeon to discuss THR and referred for physical therapy. Pt expresses that she really enjoys exercising but is very limited by her pain.   PAIN:  Low today, posterior thigh and later hip  PRECAUTIONS: None  WEIGHT BEARING RESTRICTIONS: No  FALLS: Has patient  fallen in last 6 months? No     OBJECTIVE:   LE MMT: MMT (out of 5) Right   Left   Right 1/25 Left 1/25   Hip flexion 5 4+* 5/5 5/5  Hip extension (prone)     5/5 (5/5)  Hip abduction 4+ 4* 4+/5 4/5  Hip horizonal ABDCT/ADD   5/5 5/5  Hip adduction 4+ 4*    Hip internal rotation 5 5 5/5 4+/5  Hip external rotation 5 4* 5/5 4+/5  Knee flexion 4+ 4+* 5/5 4+/5  Knee extension 5 5 5/5 5/5  Ankle dorsiflexion 5 5 5/5 5/5    TODAY'S TREATMENT   SUBJECTIVE: Remarkable improvements since injections at hip, trochanter, and SIJ. Pt is pleased to be able to participate in session with essentially no pain. Pt missed 2 visits ago when she was sick, last visit due to arriving at wrong time.    PAIN: none today   INTERVENTION 09/25/22  MMT Reassessment AA/ROM on NuStep Level 0-2 x 7 minutes for warm-up during interval history (2 minutes unbilled);  STS from chair, hands free 1x12 Forward flexed to 90 degree on elbows LLE SLR extension 1x12 Hooklying blue clam bridge 1x12 Standing Left hip ABDCT/extension 1x12 @ 5lb   STS from chair, hands free 1x12 Forward flexed to 90 degree on elbows LLE SLR extension 1x12 Hooklying blue clam bridge 1x12 Standing Left hip ABDCT/extension 1x12 @ 5lb   Left SLS with variable RLE toe taps x60sec    PATIENT EDUCATION:  Education details: Pt educated throughout session about proper posture and technique with exercises. Improved exercise technique, movement at target joints, use of target muscles after min to mod verbal, visual, tactile cues, Therapeutic neuroscience pain education; Person educated: Patient Education method: Explanation and handout Education comprehension:  verbalized understanding   HOME EXERCISE PROGRAM:  Access Code: [email or web address] URL: [email or web address] Date: 07/17/2022 Prepared by: Ria Comment  Exercises - Hooklying Lumbar Rotation  - 1 x daily - 7 x weekly - 2 sets - 10 reps - 3s hold - Supine March  - 1 x daily - 7 x weekly - 2 sets - 10 reps - 3s hold - Supine Bridge  - 1 x daily - 7 x weekly - 2 sets - 10 reps - 3s hold   ASSESSMENT:  CLINICAL IMPRESSION: Reteseted strength, much improved now that pain is controlled. Left radicular weakness more apparent, as is hip extension ROM restriction. Pt very much motivated to get back to fitness activity in gym, would like guidance on this from PT in future sessions. She will benefit from PT services to address deficits in strength, range of motion, and pain in order to improve pain with household and work responsibilities.   OBJECTIVE IMPAIRMENTS: Abnormal gait, decreased ROM, decreased strength, and pain.   ACTIVITY LIMITATIONS: lifting, bending, standing, and squatting  PARTICIPATION LIMITATIONS: cleaning, shopping, community activity, and occupation  PERSONAL FACTORS: Past/current experiences, Time since onset of injury/illness/exacerbation, and 3+ comorbidities: depression, anxiety, migraines, chronic pain, and psoriatic arthritis  are also affecting patient's functional outcome.   REHAB POTENTIAL: Fair    CLINICAL DECISION MAKING: Unstable/unpredictable  EVALUATION COMPLEXITY: High   GOALS: Goals reviewed with patient? No  SHORT TERM GOALS: Target date: 10/09/2022   Pt will be independent with HEP in order to improve strength and range of motion as well as decrease hip pain to improve function at home and work. Baseline:  Goal status: INITIAL   LONG TERM GOALS: Target date: 11/06/2022  Pt  will increase FOTO to at least 66 to demonstrate significant improvement in function at home and work related to L hip pain  Baseline: 07/08/22: 52 Goal status:  INITIAL  2.  Pt will decrease worst hip pain by at least 2 points on the NPRS in order to demonstrate clinically significant reduction in hip pain. Baseline: 07/08/22: worst: 10/10 Goal status: INITIAL  3.  Pt will report at least 25% improvement in L hip symptoms in order to demonstrate clinically significant reduction in hip pain/disability so she can return to increased frequency of exercise with less pain       Baseline: 09/25/22: achieved almost completely in response to injections, not as much due to PT interventions Goal status: INITIAL  4.  Pt will increase pain-free strength of L hip abduction/adduction by at least 1/2 MMT grade in order to demonstrate improvement in strength and function  Baseline: 07/08/22: 4/5 both with pain; 09/25/22: achieved Goal status: INITIAL   PLAN: PT FREQUENCY: 1-2x/week  PT DURATION: 8 weeks  PLANNED INTERVENTIONS: Therapeutic exercises, Therapeutic activity, Neuromuscular re-education, Balance training, Gait training, Patient/Family education, Self Care, Joint mobilization, Joint manipulation, Vestibular training, Canalith repositioning, Orthotic/Fit training, DME instructions, Dry Needling, Electrical stimulation, Spinal manipulation, Spinal mobilization, Cryotherapy, Moist heat, Taping, Traction, Ultrasound, Ionotophoresis 4mg /ml Dexamethasone, Manual therapy, and Re-evaluation.  PLAN FOR NEXT SESSION: gym stuff   2:57 PM, 09/25/22 Etta Grandchild, PT, DPT Physical Therapist - [facility] in [place] (Office)     Oakley C, PT 09/25/2022, 2:57 PM

## 2022-09-29 NOTE — Therapy (Signed)
OUTPATIENT PHYSICAL THERAPY HIP TREATMENT  Patient Name: Barbara Faulkner MRN: [id number] DOB:1965/10/06, 57 y.o., female Today's Date: 10/01/2022   PT End of Session - 10/01/22 2119     Visit Number 13    Number of Visits 33    Date for PT Re-Evaluation 11/06/22    Authorization Type BCBS Federal PPO    Authorization Time Period 09/11/22-11/06/22    PT Start Time 1316    PT Stop Time 1400    PT Time Calculation (min) 44 min    Activity Tolerance Patient tolerated treatment well    Behavior During Therapy [facility] for tasks assessed/performed            Past Medical History:  Diagnosis Date   Allergy    Anxiety    Chronic kidney disease    Chronic pain    Chronic sinusitis    COVID-19    09/14/20, 06/13/21   Depression    Heart murmur    Hiatal hernia    HPV (human papilloma virus) anogenital infection    Macromastia    Migraine    Psoriatic arthritis ([place])    Shingles    UTI (urinary tract infection)    ecoli 09/2021   Past Surgical History:  Procedure Laterality Date   BREAST BIOPSY Right 01/28/2018   US guided biopsy - heart shaped   BREAST REDUCTION SURGERY Bilateral 04/03/2020   Procedure: MAMMARY REDUCTION  (BREAST);  Surgeon: Contogiannis, Audrea Muscat, MD;  Location: [place];  Service: Plastics;  Laterality: Bilateral;  HAVE LIPOSUCTION MACHINE AVAILABLE   CHOLECYSTECTOMY  2009   COLONOSCOPY WITH PROPOFOL N/A 07/19/2018   Procedure: COLONOSCOPY WITH PROPOFOL;  Surgeon: Lin Landsman, MD;  Location[facility] ENDOSCOPY;  Service: Gastroenterology;  Laterality: N/A;   COLONOSCOPY WITH PROPOFOL N/A 09/17/2021   Procedure: COLONOSCOPY WITH PROPOFOL;  Surgeon: Lin Landsman, MD;  Location: [facility] ENDOSCOPY;  Service: Gastroenterology;  Laterality: N/A;   COLONOSCOPY WITH PROPOFOL N/A 09/18/2021   Procedure: COLONOSCOPY WITH PROPOFOL;  Surgeon: Lin Landsman, MD;  Location: [facility] ENDOSCOPY;  Service: Gastroenterology;  Laterality: N/A;    ESOPHAGOGASTRODUODENOSCOPY (EGD) WITH PROPOFOL N/A 07/19/2018   Procedure: ESOPHAGOGASTRODUODENOSCOPY (EGD) WITH PROPOFOL;  Surgeon: Lin Landsman, MD;  Location[facility] ENDOSCOPY;  Service: Gastroenterology;  Laterality: N/A;   GASTRIC BYPASS     2015; duodenal switch    LAPAROSCOPIC GASTRIC BANDING  2008   removed 2009   [place]   Patient Active Problem List   Diagnosis Date Noted   Lumbar radicular pain 10/01/2022   Fibromyalgia 10/01/2022   Normocytic anemia 06/13/2022   Elevated alkaline phosphatase level 05/28/2022   Chills 05/26/2022   Chronic left SI joint pain 11/13/2021   Piriformis syndrome, left 11/13/2021   Greater 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 11/13/2021   Primary osteoarthritis of right hip 09/16/2021   Plantar fasciitis 08/22/2021   Carpal tunnel syndrome on right 08/22/2021   Primary osteoarthritis of left hip 08/15/2021   Hyperphosphatemia 08/01/2021   Anemia in chronic kidney disease 04/18/2021   Chronic kidney disease, stage 3a ([place]) 04/18/2021   Hyperparathyroidism due to renal insufficiency ([place]) 04/18/2021   Hypertension 04/18/2021   Hiatal hernia 11/02/2020   History of migraine 11/02/2020   Gastroesophageal reflux disease 11/02/2020   Brain fog 11/02/2020   Depression, recurrent ([place]) 03/18/2020   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 09/08/2018   Arthropathy of right shoulder 09/08/2018   Controlled  substance agreement signed 09/08/2018   Myofascial pain syndrome 08/24/2018   Routine physical examination 08/24/2018   Iron deficiency anemia 08/24/2018   Elevated liver enzymes 07/19/2018   Vitamin D deficiency 07/19/2018   Colon cancer screening    Abdominal pain, epigastric 07/08/2018   Right shoulder pain 06/15/2018   Hemorrhoids 05/01/2017   Anxiety and depression 11/24/2016   Lumbar pain with radiation down left leg 09/11/2016   Large breasts 09/11/2016   Parotiditis 05/01/2016    Abnormal laboratory test result 03/26/2016   Proteinuria 03/26/2016   Right hip pain 04/18/2015   Acute sinusitis 01/08/2014   Psoriatic arthritis (HCC) 09/13/2013   DUB (dysfunctional uterine bleeding) 10/27/2012   Gastritis due to nonsteroidal anti-inflammatory drug 12/12/2011   Anaphylactic reaction due to shellfish 09/27/2010   THYROMEGALY 09/03/2010   ALLERGIC RHINITIS 03/15/2010   MIGRAINE [name] 05/17/2008   PCP: Rennie Plowman FNP  REFERRING PROVIDER: [name] MD  REFERRING DIAG: [phone] (ICD-10-CM) - Primary osteoarthritis of left hip, M53.3,G89.29 (ICD-10-CM) - Chronic left SI joint pain, G57.02 (ICD-10-CM) - Piriformis syndrome, left, M25.552 (ICD-10-CM) - Greater 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  Rationale for Evaluation and Treatment: Rehabilitation  THERAPY DIAG: Pain in left hip  Muscle weakness (generalized)  ONSET DATE: 09/02/2015 (approximate)  FOLLOW-UP APPT SCHEDULED WITH REFERRING PROVIDER: Yes   FROM INITIAL EVALUATION SUBJECTIVE:                                                                                                                                                                                         SUBJECTIVE STATEMENT: L hip pain  PERTINENT HISTORY:  Pt reports a diagnosis of Lyme Disease in 2017 with persistent widespread chronic joint pain after treatment.  Of note she has severe L hip pain as well as pain in her L knee and right shoulder. Her L hip pain has been worsening recently. She denies any pain in her R hip or L shoulder. She was previously receiving L hip injections from Dr. Martha Clan however she reports that he refused to continue with further injections so she was referred by pain management to Dr. Ashley Royalty. She saw Dr. Ashley Royalty and pt received a L hip intraarticular injection as well as an injection in her L SIJ and L greater trochanter. The injections have helped with her pain.  She was also referred to an orthopedic surgeon to discuss THR and referred for physical therapy. Pt expresses that she really enjoys exercising but is very limited by her pain.   PAIN:  Low today, posterior thigh and later hip  PRECAUTIONS: None  WEIGHT BEARING RESTRICTIONS: No  FALLS: Has patient fallen in last 6 months? No  OBJECTIVE:   LE MMT: MMT (out of 5) Right   Left   Right 1/25 Left 1/25   Hip flexion 5 4+* 5/5 5/5  Hip extension (prone)     5/5 (5/5)  Hip abduction 4+ 4* 4+/5 4/5  Hip horizonal ABDCT/ADD   5/5 5/5  Hip adduction 4+ 4*    Hip internal rotation 5 5 5/5 4+/5  Hip external rotation 5 4* 5/5 4+/5  Knee flexion 4+ 4+* 5/5 4+/5  Knee extension 5 5 5/5 5/5  Ankle dorsiflexion 5 5 5/5 5/5    TODAY'S TREATMENT   SUBJECTIVE: Pt reports that she is doing well today. She reports very mild L hip pain upon arrival. No specific questions or concerns currently.   PAIN: Mild L hip pain;   Ther-ex  NuStep Level 0-4 x 7 minutes for warm-up during interval history (2 minutes unbilled); Hooklying clams with manual resistance 2 x 15; Hooklying adductor squeeze with manual resistance 2 x 15; Supine LLE heel slide with manually resisted extension with pt pushing into therapist x 10; R sidelying L hip straight leg abduction 2 x 10; R sidelying L hip reverse clam 2 x 10; Total Gym (TG) Level 22 (L20) double leg squats 2 x 10; TG L22 double leg heel raises 2 x 10;   PATIENT EDUCATION:  Education details: Pt educated throughout session about proper posture and technique with exercises. Improved exercise technique, movement at target joints, use of target muscles after min to mod verbal, visual, tactile cues Person educated: Patient Education method: Explanation  Education comprehension: verbalized understanding   HOME EXERCISE PROGRAM:  Access Code: [email or web address] URL: [email or web address] Date: 07/17/2022 Prepared by: Ria Comment  Exercises -  Hooklying Lumbar Rotation  - 1 x daily - 7 x weekly - 2 sets - 10 reps - 3s hold - Supine March  - 1 x daily - 7 x weekly - 2 sets - 10 reps - 3s hold - Supine Bridge  - 1 x daily - 7 x weekly - 2 sets - 10 reps - 3s hold   ASSESSMENT:  CLINICAL IMPRESSION: Pt is able to participate in all exercises during session today without an increase in her pain. Session focused on strengthening. Pt encouraged to continue HEP. She plans to start a gym-based exercise program. She will benefit from PT services to address deficits in strength, range of motion, and pain in order to improve pain with household and work responsibilities.   OBJECTIVE IMPAIRMENTS: Abnormal gait, decreased ROM, decreased strength, and pain.   ACTIVITY LIMITATIONS: lifting, bending, standing, and squatting  PARTICIPATION LIMITATIONS: cleaning, shopping, community activity, and occupation  PERSONAL FACTORS: Past/current experiences, Time since onset of injury/illness/exacerbation, and 3+ comorbidities: depression, anxiety, migraines, chronic pain, and psoriatic arthritis  are also affecting patient's functional outcome.   REHAB POTENTIAL: Fair    CLINICAL DECISION MAKING: Unstable/unpredictable  EVALUATION COMPLEXITY: High   GOALS: Goals reviewed with patient? No  SHORT TERM GOALS: Target date: 10/09/2022   Pt will be independent with HEP in order to improve strength and range of motion as well as decrease hip pain to improve function at home and work. Baseline:  Goal status: INITIAL   LONG TERM GOALS: Target date: 11/06/2022  Pt will increase FOTO to at least 66 to demonstrate significant improvement in function at home and work related to L hip pain  Baseline: 07/08/22: 52 Goal status: INITIAL  2.  Pt will  decrease worst hip pain by at least 2 points on the NPRS in order to demonstrate clinically significant reduction in hip pain. Baseline: 07/08/22: worst: 10/10 Goal status: INITIAL  3.  Pt will report at least  25% improvement in L hip symptoms in order to demonstrate clinically significant reduction in hip pain/disability so she can return to increased frequency of exercise with less pain       Baseline: 09/25/22: achieved almost completely in response to injections, not as much due to PT interventions Goal status: INITIAL  4.  Pt will increase pain-free strength of L hip abduction/adduction by at least 1/2 MMT grade in order to demonstrate improvement in strength and function  Baseline: 07/08/22: 4/5 both with pain; 09/25/22: achieved Goal status: INITIAL   PLAN: PT FREQUENCY: 1-2x/week  PT DURATION: 8 weeks  PLANNED INTERVENTIONS: Therapeutic exercises, Therapeutic activity, Neuromuscular re-education, Balance training, Gait training, Patient/Family education, Self Care, Joint mobilization, Joint manipulation, Vestibular training, Canalith repositioning, Orthotic/Fit training, DME instructions, Dry Needling, Electrical stimulation, Spinal manipulation, Spinal mobilization, Cryotherapy, Moist heat, Taping, Traction, Ultrasound, Ionotophoresis 4mg /ml Dexamethasone, Manual therapy, and Re-evaluation.  PLAN FOR NEXT SESSION: gym-based exercises   Lyndel Safe Kristen Fromm PT, DPT, GCS  Candelaria Pies, PT 10/01/2022, 9:30 PM

## 2022-09-30 ENCOUNTER — Ambulatory Visit: Payer: Federal, State, Local not specified - PPO

## 2022-09-30 DIAGNOSIS — M6281 Muscle weakness (generalized): Secondary | ICD-10-CM

## 2022-09-30 DIAGNOSIS — M25552 Pain in left hip: Secondary | ICD-10-CM

## 2022-10-01 ENCOUNTER — Ambulatory Visit
Admission: RE | Admit: 2022-10-01 | Discharge: 2022-10-01 | Disposition: A | Discharge status: Home / Self Care | Payer: Federal, State, Local not specified - PPO | Source: Ambulatory Visit | Attending: Student in an Organized Health Care Education/Training Program | Admitting: Student in an Organized Health Care Education/Training Program

## 2022-10-01 ENCOUNTER — Ambulatory Visit (HOSPITAL_BASED_OUTPATIENT_CLINIC_OR_DEPARTMENT_OTHER)
Payer: Federal, State, Local not specified - PPO | Admitting: Student in an Organized Health Care Education/Training Program

## 2022-10-01 ENCOUNTER — Encounter: Payer: Self-pay | Admitting: Student in an Organized Health Care Education/Training Program

## 2022-10-01 VITALS — BP 144/89 | HR 96 | Temp 97.3°F | Resp 16 | Ht 63.0 in | Wt 157.0 lb

## 2022-10-01 DIAGNOSIS — M79605 Pain in left leg: Secondary | ICD-10-CM | POA: Insufficient documentation

## 2022-10-01 DIAGNOSIS — G894 Chronic pain syndrome: Secondary | ICD-10-CM | POA: Diagnosis not present

## 2022-10-01 DIAGNOSIS — M5416 Radiculopathy, lumbar region: Secondary | ICD-10-CM

## 2022-10-01 DIAGNOSIS — M545 Low back pain, unspecified: Secondary | ICD-10-CM | POA: Insufficient documentation

## 2022-10-01 DIAGNOSIS — M797 Fibromyalgia: Secondary | ICD-10-CM

## 2022-10-01 DIAGNOSIS — M7918 Myalgia, other site: Secondary | ICD-10-CM | POA: Insufficient documentation

## 2022-10-01 MED ORDER — OXYCODONE-ACETAMINOPHEN 5-325 MG PO TABS: 1.0000 | ORAL_TABLET | Freq: Three times a day (TID) | ORAL | 0 refills | Status: DC | PRN

## 2022-10-01 MED ORDER — TRAMADOL HCL ER 200 MG PO TB24: 200.0000 mg | ORAL_TABLET | Freq: Every day | ORAL | 2 refills | Status: DC

## 2022-10-01 NOTE — Progress Notes (Signed)
Nursing Pain Medication Assessment:  Safety precautions to be maintained throughout the outpatient stay will include: orient to surroundings, keep bed in low position, maintain call bell within reach at all times, provide assistance with transfer out of bed and ambulation.  Medication Inspection Compliance: Pill count conducted under aseptic conditions, in front of the patient. Neither the pills nor the bottle was removed from the patient's sight at any time. Once count was completed pills were immediately returned to the patient in their original bottle.  Medication: Tramadol (Ultram) Pill/Patch Count:  23 of 30 pills remain Pill/Patch Appearance: Markings consistent with prescribed medication Bottle Appearance: Standard pharmacy container. Clearly labeled. Filled Date: 01 / 21 / 2024 Last Medication intake:  Today  Oxycodone/acet 3/90 Filled 07-08-2022 Took last friday

## 2022-10-01 NOTE — Progress Notes (Signed)
PROVIDER NOTE: Information contained herein reflects review and annotations entered in association with encounter. Interpretation of such information and data should be left to medically-trained personnel. Information provided to patient can be located elsewhere in the medical record under "Patient Instructions". Document created using STT-dictation technology, any transcriptional errors that may result from process are unintentional.    Patient: Barbara Faulkner  Service Category: E/M  Provider: Edward Jolly, MD  DOB: 05/12/1966  DOS: 02/26/2021  Specialty: Interventional Pain Management  MRN: [id number]  Setting: Ambulatory outpatient  PCP: McLean-Scocuzza, Pasty Spillers, MD  Type: Established Patient    Referring Provider: Quentin Ore *  Location: Office  Delivery: Face-to-face     HPI  Barbara Faulkner, a 57 y.o. year old female, is here today because of her Lumbar pain with radiation down left leg [M54.50, M79.605]. [name] primary complain today is Leg Pain  Last encounter: My last encounter with her was on 07/08/22  Pertinent problems: Ms. 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 Pain Assessment: Severity of Chronic pain is reported as a 6 /10. Location: Leg Left/radiates right below right buttock. Onset: More than a month ago. Quality: Sharp. Timing: Constant. Modifying factor(s): meds. Vitals:  height is 5\' 3"  (1.6 m) and weight is 157 lb (71.2 kg). Her temperature is 97.3 F (36.3 C) (abnormal). Her blood pressure is 144/89 (abnormal) and her pulse is 96. Her respiration is 16 and oxygen saturation is 97%.   Reason for encounter: medication management.  Patient also endorses radiating left leg pain   Refill of tramadol 200 mg ER as well as oxycodone which she takes 5 mg daily as needed for  breakthrough pain. [name] is also endorsing mild lumbar spine pain with radiating leg pain down her posterior lateral leg in a dermatomal fashion that is severe in nature. More recently, she has had a left SI joint, left greater trochanteric and left hip joint injection under ultrasound guidance with Dr. Misty Stanley which she states was helpful.  She is currently working with physical therapy for her lower back and left leg pain as well as left hip pain.  Pharmacotherapy Assessment   Analgesic: Tramadol 200 mg ER, continue oxycodone 5 mg daily for breakthrough pain.     Monitoring: [place] PMP: PDMP reviewed during this encounter.       Pharmacotherapy: No side-effects or adverse reactions reported. Compliance: No problems identified. Effectiveness: Clinically acceptable.  UDS:  Summary  Date Value Ref Range Status  05/21/2021 Note  Final    Comment:    ==================================================================== ToxASSURE Select 13 (MW) ==================================================================== Test                             Result       Flag       Units  Drug Present and Declared for Prescription Verification   Tramadol                       1367         EXPECTED   ng/mg creat   O-Desmethyltramadol            1186         EXPECTED   ng/mg creat   N-Desmethyltramadol  233          EXPECTED   ng/mg creat    Source of tramadol is a prescription medication. O-desmethyltramadol    and N-desmethyltramadol are expected metabolites of tramadol.  Drug Absent but Declared for Prescription Verification   Oxycodone                      Not Detected UNEXPECTED ng/mg creat ==================================================================== Test                      Result    Flag   Units      Ref Range   Creatinine              194              mg/dL      >=20 ==================================================================== Declared Medications:  The flagging and  interpretation on this report are based on the  following declared medications.  Unexpected results may arise from  inaccuracies in the declared medications.   **Note: The testing scope of this panel includes these medications:   Oxycodone (Percocet)  Tramadol (Ultram)   **Note: The testing scope of this panel does not include the  following reported medications:   Acetaminophen (Percocet)  Albuterol (Ventolin HFA)  Cholecalciferol  Diclofenac (Voltaren)  Fluticasone (Flonase)  Gabapentin (Neurontin)  Lamotrigine (Lamictal)  Methocarbamol (Robaxin)  Pantoprazole (Protonix)  Quetiapine (Seroquel)  Rizatriptan (Maxalt) ==================================================================== For clinical consultation, please call [phone]. ====================================================================       ROS  Constitutional: Denies any fever or chills Gastrointestinal: No reported hemesis, hematochezia, vomiting, or acute GI distress Musculoskeletal:   Low back, left posterior lateral leg pain Neurological: No reported episodes of acute onset apraxia, aphasia, dysarthria, agnosia, amnesia, paralysis, loss of coordination, or loss of consciousness  Medication Review  Cholecalciferol, DULoxetine, QUEtiapine, Vitamin D, calcitRIOL, cyclobenzaprine, gabapentin, lamoTRIgine, oxyCODONE-acetaminophen, pantoprazole, and traMADol  History Review  Allergy: Ms. Giebel is allergic to shellfish allergy and nsaids. Drug: Ms. Milliron  reports no history of drug use. Alcohol:  reports that she does not currently use alcohol. Tobacco:  reports that she quit smoking about 19 years ago. Her smoking use included cigarettes. She has a 40.00 pack-year smoking history. She has never used smokeless tobacco. Social: Ms. Besse  reports that she quit smoking about 19 years ago. Her smoking use included cigarettes. She has a 40.00 pack-year smoking history. She has never used smokeless  tobacco. She reports that she does not currently use alcohol. She reports that she does not use drugs. Medical:  has a past medical history of Allergy, Anxiety, Chronic kidney disease, Chronic pain, Chronic sinusitis, COVID-19, Depression, Heart murmur, Hiatal hernia, HPV (human papilloma virus) anogenital infection, Macromastia, Migraine, Psoriatic arthritis ([place]), Shingles, and UTI (urinary tract infection). Surgical: Ms. Bottenfield  has a past surgical history that includes Cholecystectomy (2009); Tubal ligation (1997); Laparoscopic gastric banding (2008); Gastric bypass; Breast biopsy (Right, 01/28/2018); Colonoscopy with propofol (N/A, 07/19/2018); Esophagogastroduodenoscopy (egd) with propofol (N/A, 07/19/2018); Breast reduction surgery (Bilateral, 04/03/2020); Colonoscopy with propofol (N/A, 09/17/2021); Colonoscopy with propofol (N/A, 09/18/2021); and Reduction mammaplasty. Family: family history includes HIV in her father; Hepatitis in her maternal uncle; Hyperlipidemia in her mother; Hypertension in her mother; Lung cancer in her maternal grandfather; Stroke in her mother.  Laboratory Chemistry Profile   Renal Lab Results  Component Value Date   BUN 18 06/11/2022   CREATININE 1.10 35/57/3220   BCR NOT APPLICABLE 25/42/7062   GFR  57.17 (L) 06/11/2022   GFRAA >60 02/15/2014   GFRNONAA 51 (L) 05/26/2022     Hepatic Lab Results  Component Value Date   AST 85 (H) 06/11/2022   ALT 90 (H) 06/11/2022   ALBUMIN 4.0 06/11/2022   ALKPHOS 150 (H) 06/11/2022   ALKPHOS 171 (H) 06/11/2022   AMYLASE 33 09/25/2014   LIPASE 124 10/04/2013     Electrolytes Lab Results  Component Value Date   NA 142 06/11/2022   K 4.3 06/11/2022   CL 110 06/11/2022   CALCIUM 8.7 06/11/2022   MG 1.9 07/14/2018   PHOS 5.0 (H) 09/25/2014     Bone Lab Results  Component Value Date   VD25OH 40.34 06/11/2022     Inflammation (CRP: Acute Phase) (ESR: Chronic Phase) Lab Results  Component Value Date   CRP  <1.0 03/10/2019   ESRSEDRATE 3 03/10/2019       Note: Above Lab results reviewed.  Recent Imaging Review  DG Knee Complete 4 Views Left CLINICAL DATA:  Pain  EXAM: LEFT KNEE - COMPLETE 4 VIEW  COMPARISON:  None Available.  FINDINGS: No evidence of fracture, dislocation, or joint effusion. No evidence of arthropathy or other focal bone abnormality. Soft tissues are unremarkable.  IMPRESSION: Negative.  Electronically Signed   By: Layla Maw M.D.   On: 09/14/2022 11:24  Note: Reviewed        Physical Exam  General appearance: Well nourished, well developed, and well hydrated. In no apparent acute distress Mental status: Alert, oriented x 3 (person, place, & time)       Respiratory: No evidence of acute respiratory distress Eyes: PERLA Vitals: BP (!) 144/89   Pulse 96   Temp (!) 97.3 F (36.3 C)   Resp 16   Ht 5\' 3"  (1.6 m)   Wt 157 lb (71.2 kg)   LMP 04/02/2014   SpO2 97%   BMI 27.81 kg/m  BMI: Estimated body mass index is 27.81 kg/m as calculated from the following:   Height as of this encounter: 5\' 3"  (1.6 m).   Weight as of this encounter: 157 lb (71.2 kg). Ideal: Ideal body weight: 52.4 kg (115 lb 8.3 oz) Adjusted ideal body weight: 59.9 kg (132 lb 1.8 oz)   Lumbar Spine Area Exam  Skin & Axial Inspection: No masses, redness, or swelling Alignment: Symmetrical Functional ROM: Pain restricted ROM affecting primarily the left Stability: No instability detected Muscle Tone/Strength: Functionally intact. No obvious neuro-muscular anomalies detected. Sensory (Neurological): Dermatomal pain pattern left L4-L5 Palpation: No palpable anomalies       Provocative Tests: Hyperextension/rotation test: deferred today       Lumbar quadrant test (Kemp's test): (+) on the left for foraminal stenosis Lateral bending test: (+) ipsilateral radicular pain, on the left. Positive for left-sided foraminal stenosis.  Gait & Posture Assessment  Ambulation:  Unassisted Gait: Relatively normal for age and body habitus Posture: WNL  Lower Extremity Exam    Side: Right lower extremity  Side: Left lower extremity  Stability: No instability observed          Stability: No instability observed          Skin & Extremity Inspection: Skin color, temperature, and hair growth are WNL. No peripheral edema or cyanosis. No masses, redness, swelling, asymmetry, or associated skin lesions. No contractures.  Skin & Extremity Inspection: Skin color, temperature, and hair growth are WNL. No peripheral edema or cyanosis. No masses, redness, swelling, asymmetry, or associated skin lesions. No contractures.  Functional ROM: Unrestricted ROM                  Functional ROM: Unrestricted ROM                  Muscle Tone/Strength: Functionally intact. No obvious neuro-muscular anomalies detected.  Muscle Tone/Strength: Functionally intact. No obvious neuro-muscular anomalies detected.  Sensory (Neurological): Unimpaired        Sensory (Neurological): Unimpaired        DTR: Patellar: deferred today Achilles: deferred today Plantar: deferred today  DTR: Patellar: deferred today Achilles: deferred today Plantar: deferred today  Palpation: No palpable anomalies  Palpation: No palpable anomalies     Assessment   Status Diagnosis  Having a Flare-up Having a Flare-up Controlled 1. Lumbar pain with radiation down left leg   2. Lumbar radicular pain   3. Fibromyalgia   4. Myofascial pain syndrome   5. Chronic pain syndrome           Plan of Care  Ms. ROSCHELLE CALANDRA has a current medication list which includes the following long-term medication(s): duloxetine, gabapentin, pantoprazole, and quetiapine.  Continue with physical therapy.  Refill of medications as below.  Recommend lumbar spine x-ray, flexion-extension as well as lumbar MRI to evaluate for any neuroforaminal stenosis, canal stenosis that could be contributing to her radiating left leg pain. Renew  urine toxicology screen as below. Continue multimodal analgesics including Flexeril 5 mg nightly, Cymbalta 60 mg daily, gabapentin 400 mg nightly. Continue with psychiatric management and care.  Pharmacotherapy (Medications Ordered): Meds ordered this encounter  Medications   traMADol (ULTRAM-ER) 200 MG 24 hr tablet    Sig: Take 1 tablet (200 mg total) by mouth daily.    Dispense:  30 tablet    Refill:  2   oxyCODONE-acetaminophen (PERCOCET) 5-325 MG tablet    Sig: Take 1 tablet by mouth every 8 (eight) hours as needed for severe pain. Must last 30 days.    Dispense:  90 tablet    Refill:  0    Chronic Pain: STOP Act (Not applicable) Fill 1 day early if closed on refill date. Avoid benzodiazepines within 8 hours of opioids   Orders Placed This Encounter  Procedures   DG Lumbar Spine Complete W/Bend    Patient presents with axial pain with possible radicular component. Please assist [place] in identifying specific level(s) and laterality of any additional findings such as: 1. Facet (Zygapophyseal) joint DJD (Hypertrophy, space narrowing, subchondral sclerosis, and/or osteophyte formation) 2. DDD and/or IVDD (Loss of disc height, desiccation, gas patterns, osteophytes, endplate sclerosis, or "Black disc disease") 3. Pars defects 4. Spondylolisthesis, spondylosis, and/or spondyloarthropathies (include Degree/Grade of displacement in mm) (stability) 5. Vertebral body Fractures (acute/chronic) (state percentage of collapse) 6. Demineralization (osteopenia/osteoporotic) 7. Bone pathology 8. Foraminal narrowing  9. Surgical changes    Standing Status:   Future    Number of Occurrences:   1    Standing Expiration Date:   10/30/2022    Scheduling Instructions:     Please make sure that the patient understands that this needs to be done as soon as possible. Never have the patient do the imaging "just before the next appointment". Inform patient that having the imaging done within the [facility]  will expedite the availability of the results and will provide      imaging availability to the requesting physician. In addition inform the patient that the imaging order has an expiration date and will not be renewed  if not done within the active period.    Order Specific Question:   Reason for Exam (SYMPTOM  OR DIAGNOSIS REQUIRED)    Answer:   Low back pain    Order Specific Question:   Is patient pregnant?    Answer:   No    Order Specific Question:   Preferred imaging location?    Answer:   [facility]    Order Specific Question:   Call Results- Best Contact Number?    Answer:   [phone] ([facility])    Order Specific Question:   Radiology Contrast Protocol - do NOT remove file path    Answer:   \\[email or web address]    Order Specific Question:   Release to patient    Answer:   Immediate   MR LUMBAR SPINE WO CONTRAST    Patient presents with axial pain with possible radicular component. Please assist [place] in identifying specific level(s) and laterality of any additional findings such as: 1. Facet (Zygapophyseal) joint DJD (Hypertrophy, space narrowing, subchondral sclerosis, and/or osteophyte formation) 2. DDD and/or IVDD (Loss of disc height, desiccation, gas patterns, osteophytes, endplate sclerosis, or "Black disc disease") 3. Pars defects 4. Spondylolisthesis, spondylosis, and/or spondyloarthropathies (include Degree/Grade of displacement in mm) (stability) 5. Vertebral body Fractures (acute/chronic) (state percentage of collapse) 6. Demineralization (osteopenia/osteoporotic) 7. Bone pathology 8. Foraminal narrowing  9. Surgical changes 10. Central, Lateral Recess, and/or Foraminal Stenosis (include AP diameter of stenosis in mm) 11. Surgical changes (hardware type, status, and presence of fibrosis) 12. Modic Type Changes (MRI only) 13. IVDD (Disc bulge, protrusion, herniation, extrusion) (Level, laterality, extent)     Standing Status:   Future    Standing Expiration Date:   10/30/2022    Scheduling Instructions:     Please make sure that the patient understands that this needs to be done as soon as possible. Never have the patient do the imaging "just before the next appointment". Inform patient that having the imaging done within the [facility] will expedite the availability of the results and will provide      imaging availability to the requesting physician. In addition inform the patient that the imaging order has an expiration date and will not be renewed if not done within the active period.    Order Specific Question:   What is the patient's sedation requirement?    Answer:   No Sedation    Order Specific Question:   Does the patient have a pacemaker or implanted devices?    Answer:   No    Order Specific Question:   Preferred imaging location?    Answer:   [facility] (table limit-350lbs)    Order Specific Question:   Call Results- Best Contact Number?    Answer:   [phone] ([facility])    Order Specific Question:   Radiology Contrast Protocol - do NOT remove file path    Answer:   \\[email or web address]   ToxASSURE Select 13 (MW), Urine    Volume: 30 ml(s). Minimum 3 ml of urine is needed. Document temperature of fresh sample. Indications: Long term (current) use of opiate analgesic [phone])    Order Specific Question:   Release to patient    Answer:   Immediate     Follow-up plan:   Return in about 3 months (around 01/08/2023) for Medication Management, in person.   Recent Visits Date Type Provider Dept  07/08/22 Office Visit Gillis Santa, MD [facility]  Showing recent visits within past 54  days and meeting all other requirements Today's Visits Date Type Provider Dept  10/01/22 Office Visit Gillis Santa, MD [facility]  Showing today's visits and meeting all other requirements Future Appointments No visits were found  meeting these conditions. Showing future appointments within next 90 days and meeting all other requirements  I discussed the assessment and treatment plan with the patient. The patient was provided an opportunity to ask questions and all were answered. The patient agreed with the plan and demonstrated an understanding of the instructions.  Patient advised to call back or seek an in-person evaluation if the symptoms or condition worsens.  Duration of encounter:74minutes.  Note by: Gillis Santa, MD Date: 10/01/2022; Time: 3:23 PM

## 2022-10-02 ENCOUNTER — Encounter
Payer: Federal, State, Local not specified - PPO | Admitting: Student in an Organized Health Care Education/Training Program

## 2022-10-04 LAB — TOXASSURE SELECT 13 (MW), URINE

## 2022-10-07 ENCOUNTER — Other Ambulatory Visit: Payer: Self-pay | Admitting: Student in an Organized Health Care Education/Training Program

## 2022-10-07 ENCOUNTER — Ambulatory Visit: Payer: Federal, State, Local not specified - PPO | Attending: Family Medicine

## 2022-10-07 ENCOUNTER — Ambulatory Visit: Payer: Federal, State, Local not specified - PPO | Admitting: Family

## 2022-10-07 DIAGNOSIS — J0100 Acute maxillary sinusitis, unspecified: Secondary | ICD-10-CM | POA: Diagnosis not present

## 2022-10-07 DIAGNOSIS — R051 Acute cough: Secondary | ICD-10-CM

## 2022-10-07 DIAGNOSIS — L4050 Arthropathic psoriasis, unspecified: Secondary | ICD-10-CM

## 2022-10-07 DIAGNOSIS — Z1231 Encounter for screening mammogram for malignant neoplasm of breast: Secondary | ICD-10-CM

## 2022-10-07 DIAGNOSIS — M1612 Unilateral primary osteoarthritis, left hip: Secondary | ICD-10-CM

## 2022-10-07 LAB — POCT INFLUENZA A/B
Influenza A, POC: NEGATIVE
Influenza B, POC: NEGATIVE

## 2022-10-07 LAB — POC COVID19 BINAXNOW: SARS Coronavirus 2 Ag: NEGATIVE

## 2022-10-07 MED ORDER — PREDNISONE 10 MG PO TABS: ORAL_TABLET | ORAL | 0 refills | Status: DC

## 2022-10-07 NOTE — Patient Instructions (Addendum)
Please call  and schedule your 3D mammogram and /or bone density scan as we discussed.   [facility]  ( new location in 2023)  [address], [place], [place] [address]  [place], [place]  I suspect sinus congestion is either viral or allergic in etiology.  You may continue over-the-counter antihistamine.  It  is reasonable to start Mucinex since congestion is thin , clear at this time.  You may start over-the-counter Afrin nasal spray.  This medication is intended to be used only for a couple of days with a maximum one week as it can cause rebound nasal congestion.  If Afrin is not helpful for the congestion, sinus pain you are experiencing, you may start low-dose prednisone taper as discussed today.  Please make sure you get all prednisone doses in  prior to 12 or 1 PM at the latest as it can interfere with sleep.  If you develop fever or worsening symptoms please let me know as at that point I would recommend an antibiotic.

## 2022-10-07 NOTE — Progress Notes (Unsigned)
Assessment & Plan:  Acute non-recurrent maxillary sinusitis Assessment & Plan: Patient well-appearing, nontoxic in appearance.  Duration 3 days.  COVID and flu point-of-care negative today.  Discussed with patient more likely to be either viral or allergic in etiology.  We discussed conservative therapy including continued use of antihistamine.  She may start Afrin nasal spray for short period of time, maximum 1 week due to rebound congestion.  If ineffective, we have agreed to start short course of prednisone.  Certainly if symptoms were to worsen or fail to respond to conservative therapy, we would start antibiotics.  Patient verbalized understanding and agreed with plan  Orders: -     predniSONE; Take 40 mg by mouth on day 1, then taper 10 mg daily until gone  Dispense: 10 tablet; Refill: 0  Acute cough -     POCT Influenza A/B -     POC COVID-19 BinaxNow  Encounter for screening mammogram for malignant neoplasm of breast -     3D Screening Mammogram, Left and Right; Future     Return precautions given.   Risks, benefits, and alternatives of the medications and treatment plan prescribed today were discussed, and patient expressed understanding.   Education regarding symptom management and diagnosis given to patient on AVS either electronically or printed.  No follow-ups on file.  [name], FNP  Subjective:    Patient ID: Barbara Faulkner, female    DOB: 05-17-1966, 57 y.o.   MRN: [id number]  CC: Barbara Faulkner is a 57 y.o. female who presents today for an acute visit.    HPI: Complains of sinus pressure, HA, teeth pain x 3 days.   She has started to cough this morning.   Endorses thin clear nasal congestion.   No cavities, tooth sensitivities.   No sore throat, ear pain, fever, chills, sob  She has tried antihistamine, mucinex sinus max with some relief.  She has done well on prednisone in the past  Previous history of seasonal allergies H/o CKD, GFR 57  (06/2022)   Allergies: Shellfish allergy and Nsaids Current Outpatient Medications on File Prior to Visit  Medication Sig Dispense Refill   calcitRIOL (ROCALTROL) 0.25 MCG capsule Take 0.25 mcg by mouth daily.     Cholecalciferol 1.25 MG (50000 UT) capsule Take 1 capsule (50,000 Units total) by mouth once a week. D3 13 capsule 1   cyclobenzaprine (FLEXERIL) 5 MG tablet Take 1 tablet (5 mg total) by mouth at bedtime as needed for muscle spasms. 90 tablet 1   DULoxetine (CYMBALTA) 60 MG capsule Take 1 capsule (60 mg total) by mouth daily. 90 capsule 3   gabapentin (NEURONTIN) 400 MG capsule Take 1 capsule (400 mg total) by mouth at bedtime. 90 capsule 2   lamoTRIgine (LAMICTAL) 150 MG tablet Take 150 mg by mouth daily.     pantoprazole (PROTONIX) 20 MG tablet Take 1 tablet (20 mg total) by mouth daily. Take 30 minutes or an prior to breakfast 90 tablet 0   QUEtiapine (SEROQUEL) 100 MG tablet Take 100 mg by mouth at bedtime.     [START ON 10/21/2022] traMADol (ULTRAM-ER) 200 MG 24 hr tablet Take 1 tablet (200 mg total) by mouth daily. 30 tablet 2   VITAMIN D PO Take by mouth daily at 12 noon.     Current Facility-Administered Medications on File Prior to Visit  Medication Dose Route Frequency Provider Last Rate Last Admin   lidocaine (PF) (XYLOCAINE) 1 % injection 2 mL  2  mL Intradermal Once Montel Culver, MD        Review of Systems  Constitutional:  Negative for chills and fever.  HENT:  Positive for congestion, rhinorrhea and sinus pain. Negative for ear pain.   Respiratory:  Positive for cough. Negative for shortness of breath.   Cardiovascular:  Negative for chest pain and palpitations.  Gastrointestinal:  Negative for nausea and vomiting.  Neurological:  Positive for headaches.      Objective:    LMP 04/02/2014   BP Readings from Last 3 Encounters:  10/01/22 (!) 144/89  09/12/22 128/76  07/08/22 119/84   Wt Readings from Last 3 Encounters:  10/01/22 157 lb (71.2 kg)   09/12/22 161 lb (73 kg)  07/08/22 166 lb (75.3 kg)    Physical Exam Vitals reviewed.  Constitutional:      Appearance: She is well-developed.  HENT:     Head: Normocephalic and atraumatic.     Right Ear: Hearing, tympanic membrane, ear canal and external ear normal. No decreased hearing noted. No drainage, swelling or tenderness. No middle ear effusion. No foreign body. Tympanic membrane is not erythematous or bulging.     Left Ear: Hearing, tympanic membrane, ear canal and external ear normal. No decreased hearing noted. No drainage, swelling or tenderness.  No middle ear effusion. No foreign body. Tympanic membrane is not erythematous or bulging.     Nose: No rhinorrhea.     Right Sinus: Maxillary sinus tenderness present. No frontal sinus tenderness.     Left Sinus: Maxillary sinus tenderness present. No frontal sinus tenderness.     Mouth/Throat:     Pharynx: Uvula midline. No oropharyngeal exudate or posterior oropharyngeal erythema.     Tonsils: No tonsillar abscesses.  Eyes:     Conjunctiva/sclera: Conjunctivae normal.  Cardiovascular:     Rate and Rhythm: Regular rhythm.     Pulses: Normal pulses.     Heart sounds: Normal heart sounds.  Pulmonary:     Effort: Pulmonary effort is normal.     Breath sounds: Normal breath sounds. No wheezing, rhonchi or rales.  Lymphadenopathy:     Head:     Right side of head: No submental, submandibular, tonsillar, preauricular, posterior auricular or occipital adenopathy.     Left side of head: No submental, submandibular, tonsillar, preauricular, posterior auricular or occipital adenopathy.     Cervical: No cervical adenopathy.  Skin:    General: Skin is warm and dry.  Neurological:     Mental Status: She is alert.  Psychiatric:        Speech: Speech normal.        Behavior: Behavior normal.        Thought Content: Thought content normal.

## 2022-10-07 NOTE — Assessment & Plan Note (Signed)
Patient well-appearing, nontoxic in appearance.  Duration 3 days.  COVID and flu point-of-care negative today.  Discussed with patient more likely to be either viral or allergic in etiology.  We discussed conservative therapy including continued use of antihistamine.  She may start Afrin nasal spray for short period of time, maximum 1 week due to rebound congestion.  If ineffective, we have agreed to start short course of prednisone.  Certainly if symptoms were to worsen or fail to respond to conservative therapy, we would start antibiotics.  Patient verbalized understanding and agreed with plan

## 2022-10-09 ENCOUNTER — Ambulatory Visit: Payer: Federal, State, Local not specified - PPO

## 2022-10-13 ENCOUNTER — Ambulatory Visit
Admission: RE | Admit: 2022-10-13 | Discharge: 2022-10-13 | Disposition: A | Discharge status: Home / Self Care | Payer: Federal, State, Local not specified - PPO | Source: Ambulatory Visit | Attending: Student in an Organized Health Care Education/Training Program | Admitting: Student in an Organized Health Care Education/Training Program

## 2022-10-13 DIAGNOSIS — M5416 Radiculopathy, lumbar region: Secondary | ICD-10-CM

## 2022-10-13 DIAGNOSIS — M5126 Other intervertebral disc displacement, lumbar region: Secondary | ICD-10-CM | POA: Diagnosis not present

## 2022-10-13 DIAGNOSIS — M48061 Spinal stenosis, lumbar region without neurogenic claudication: Secondary | ICD-10-CM | POA: Diagnosis not present

## 2022-10-13 DIAGNOSIS — M47816 Spondylosis without myelopathy or radiculopathy, lumbar region: Secondary | ICD-10-CM | POA: Diagnosis not present

## 2022-10-13 DIAGNOSIS — M545 Low back pain, unspecified: Secondary | ICD-10-CM | POA: Insufficient documentation

## 2022-10-13 DIAGNOSIS — M79605 Pain in left leg: Secondary | ICD-10-CM | POA: Diagnosis not present

## 2022-10-14 ENCOUNTER — Ambulatory Visit: Payer: Federal, State, Local not specified - PPO | Attending: Family Medicine

## 2022-10-14 DIAGNOSIS — M25552 Pain in left hip: Secondary | ICD-10-CM | POA: Insufficient documentation

## 2022-10-14 DIAGNOSIS — M6281 Muscle weakness (generalized): Secondary | ICD-10-CM | POA: Insufficient documentation

## 2022-10-16 ENCOUNTER — Ambulatory Visit
Payer: Federal, State, Local not specified - PPO | Attending: Student in an Organized Health Care Education/Training Program | Admitting: Student in an Organized Health Care Education/Training Program

## 2022-10-16 ENCOUNTER — Ambulatory Visit: Payer: Federal, State, Local not specified - PPO

## 2022-10-16 DIAGNOSIS — G894 Chronic pain syndrome: Secondary | ICD-10-CM

## 2022-10-16 DIAGNOSIS — M6281 Muscle weakness (generalized): Secondary | ICD-10-CM

## 2022-10-16 DIAGNOSIS — M79605 Pain in left leg: Secondary | ICD-10-CM

## 2022-10-16 DIAGNOSIS — M25552 Pain in left hip: Secondary | ICD-10-CM | POA: Diagnosis not present

## 2022-10-16 DIAGNOSIS — M545 Low back pain, unspecified: Secondary | ICD-10-CM

## 2022-10-16 DIAGNOSIS — M5416 Radiculopathy, lumbar region: Secondary | ICD-10-CM

## 2022-10-16 NOTE — Therapy (Signed)
OUTPATIENT PHYSICAL THERAPY HIP TREATMENT  Patient Name: Barbara Faulkner MRN: [id number] DOB:May 03, 1966, 57 y.o., female Today's Date: 10/16/2022   PT End of Session - 10/16/22 1453     Visit Number 14    Number of Visits 33    Date for PT Re-Evaluation 11/06/22    PT Start Time 1450    PT Stop Time [id number]    PT Time Calculation (min) 40 min             Past Medical History:  Diagnosis Date   Allergy    Anxiety    Chronic kidney disease    Chronic pain    Chronic sinusitis    COVID-19    09/14/20, 06/13/21   Depression    Heart murmur    Hiatal hernia    HPV (human papilloma virus) anogenital infection    Macromastia    Migraine    Psoriatic arthritis ([place])    Shingles    UTI (urinary tract infection)    ecoli 09/2021   Past Surgical History:  Procedure Laterality Date   BREAST BIOPSY Right 01/28/2018   US guided biopsy - heart shaped   BREAST REDUCTION SURGERY Bilateral 04/03/2020   Procedure: MAMMARY REDUCTION  (BREAST);  Surgeon: Contogiannis, Audrea Muscat, MD;  Location: [facility];  Service: Plastics;  Laterality: Bilateral;  HAVE LIPOSUCTION MACHINE AVAILABLE   CHOLECYSTECTOMY  2009   COLONOSCOPY WITH PROPOFOL N/A 07/19/2018   Procedure: COLONOSCOPY WITH PROPOFOL;  Surgeon: Lin Landsman, MD;  Location: [facility] ENDOSCOPY;  Service: Gastroenterology;  Laterality: N/A;   COLONOSCOPY WITH PROPOFOL N/A 09/17/2021   Procedure: COLONOSCOPY WITH PROPOFOL;  Surgeon: Lin Landsman, MD;  Location: [facility] ENDOSCOPY;  Service: Gastroenterology;  Laterality: N/A;   COLONOSCOPY WITH PROPOFOL N/A 09/18/2021   Procedure: COLONOSCOPY WITH PROPOFOL;  Surgeon: Lin Landsman, MD;  Location: [facility] ENDOSCOPY;  Service: Gastroenterology;  Laterality: N/A;   ESOPHAGOGASTRODUODENOSCOPY (EGD) WITH PROPOFOL N/A 07/19/2018   Procedure: ESOPHAGOGASTRODUODENOSCOPY (EGD) WITH PROPOFOL;  Surgeon: Lin Landsman, MD;  Location: [facility] ENDOSCOPY;  Service:  Gastroenterology;  Laterality: N/A;   GASTRIC BYPASS     2015; duodenal switch    LAPAROSCOPIC GASTRIC BANDING  2008   removed 2009   [place]   Patient Active Problem List   Diagnosis Date Noted   Lumbar radicular pain 10/01/2022   Fibromyalgia 10/01/2022   Normocytic anemia 06/13/2022   Elevated alkaline phosphatase level 05/28/2022   Chills 05/26/2022   Chronic left SI joint pain 11/13/2021   Piriformis syndrome, left 11/13/2021   Greater 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 11/13/2021   Primary osteoarthritis of right hip 09/16/2021   Plantar fasciitis 08/22/2021   Carpal tunnel syndrome on right 08/22/2021   Primary osteoarthritis of left hip 08/15/2021   Hyperphosphatemia 08/01/2021   Anemia in chronic kidney disease 04/18/2021   Chronic kidney disease, stage 3a ([place]) 04/18/2021   Hyperparathyroidism due to renal insufficiency ([place]) 04/18/2021   Hypertension 04/18/2021   Hiatal hernia 11/02/2020   History of migraine 11/02/2020   Gastroesophageal reflux disease 11/02/2020   Brain fog 11/02/2020   Depression, recurrent ([place]) 03/18/2020   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 09/08/2018   Arthropathy of right shoulder 09/08/2018   Controlled substance agreement signed 09/08/2018   Myofascial pain syndrome 08/24/2018   Routine physical examination 08/24/2018   Iron deficiency anemia 08/24/2018   Elevated liver enzymes 07/19/2018   Vitamin D deficiency  07/19/2018   Colon cancer screening    Abdominal pain, epigastric 07/08/2018   Right shoulder pain 06/15/2018   Hemorrhoids 05/01/2017   Anxiety and depression 11/24/2016   Lumbar pain with radiation down left leg 09/11/2016   Large breasts 09/11/2016   Parotiditis 05/01/2016   Abnormal laboratory test result 03/26/2016   Proteinuria 03/26/2016   Right hip pain 04/18/2015   Sinusitis 01/08/2014   Psoriatic arthritis ([place]) 09/13/2013   DUB (dysfunctional uterine  bleeding) 10/27/2012   Gastritis due to nonsteroidal anti-inflammatory drug 12/12/2011   Anaphylactic reaction due to shellfish 09/27/2010   THYROMEGALY 09/03/2010   ALLERGIC RHINITIS 03/15/2010   MIGRAINE Elio Forget W/O INTRACT W/O STATUS MIGRNOSUS 05/17/2008   PCP: [name] FNP  REFERRING PROVIDER: Rosette Reveal MD  REFERRING DIAG: [phone] (ICD-10-CM) - Primary osteoarthritis of left hip, M53.3,G89.29 (ICD-10-CM) - Chronic left SI joint pain, G57.02 (ICD-10-CM) - Piriformis syndrome, left, M25.552 (ICD-10-CM) - Greater 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  Rationale for Evaluation and Treatment: Rehabilitation  THERAPY DIAG: Pain in left hip  Muscle weakness (generalized)  ONSET DATE: 09/02/2015 (approximate)  FOLLOW-UP APPT SCHEDULED WITH REFERRING PROVIDER: Yes   FROM INITIAL EVALUATION SUBJECTIVE:                                                                                                                                                                                         SUBJECTIVE STATEMENT: L hip pain  PERTINENT HISTORY:  Pt reports a diagnosis of Lyme Disease in 2017 with persistent widespread chronic joint pain after treatment.  Of note she has severe L hip pain as well as pain in her L knee and right shoulder. Her L hip pain has been worsening recently. She denies any pain in her R hip or L shoulder. She was previously receiving L hip injections from Dr. Mack Guise however she reports that he refused to continue with further injections so she was referred by pain management to Dr. Zigmund Daniel. She saw Dr. Zigmund Daniel and pt received a L hip intraarticular injection as well as an injection in her L SIJ and L greater trochanter. The injections have helped with her pain. She was also referred to an orthopedic surgeon to discuss THR and referred for physical therapy. Pt expresses that she really enjoys exercising but is very limited by her pain.   PAIN:  Low  today, posterior thigh and later hip  PRECAUTIONS: None  WEIGHT BEARING RESTRICTIONS: No  FALLS: Has patient fallen in last 6 months? No  OBJECTIVE:   LE MMT: MMT (out of 5) Right   Left   Right 1/25 Left 1/25   Hip flexion  5 4+* 5/5 5/5  Hip extension (prone)     5/5 (5/5)  Hip abduction 4+ 4* 4+/5 4/5  Hip horizonal ABDCT/ADD   5/5 5/5  Hip adduction 4+ 4*    Hip internal rotation 5 5 5/5 4+/5  Hip external rotation 5 4* 5/5 4+/5  Knee flexion 4+ 4+* 5/5 4+/5  Knee extension 5 5 5/5 5/5  Ankle dorsiflexion 5 5 5/5 5/5    TODAY'S TREATMENT   SUBJECTIVE: Pt reports that she is doing well today. She reports of no pain in L hip since the steroid injection 3 weeks ago.    No specific questions or concerns currently.   PAIN: Mild L hip pain;   Ther-ex  NuStep Level 0-4 x 10 minutes for warm-up during interval history  Bridges with Black TB(BTB) 2 x 60 secs SL bridges with BTB 2 x 60 secs Bridges with BTB while marching 4 x 20 secs ( core engaged) Calm shell with BTB 2 x 60 secs   Not performed: Hooklying clams with manual resistance 2 x 15; Hooklying adductor squeeze with manual resistance 2 x 15; Supine LLE heel slide with manually resisted extension with pt pushing into therapist x 10; R sidelying L hip straight leg abduction 2 x 10; R sidelying L hip reverse clam 2 x 10; Total Gym (TG) Level 22 (L20) double leg squats 2 x 10; TG L22 double leg heel raises 2 x 10;   PATIENT EDUCATION:  Education details: Pt educated throughout session about proper posture and technique with exercises. Improved exercise technique, movement at target joints, use of target muscles after min to mod verbal, visual, tactile cues Person educated: Patient Education method: Explanation  Education comprehension: verbalized understanding   HOME EXERCISE PROGRAM:  Access Code: [email or web address] URL: [email or web address] Date: 07/17/2022 Prepared by: [facility] Hires  Exercises -  Hooklying Lumbar Rotation  - 1 x daily - 7 x weekly - 2 sets - 10 reps - 3s hold - Supine March  - 1 x daily - 7 x weekly - 2 sets - 10 reps - 3s hold - Supine Bridge  - 1 x daily - 7 x weekly - 2 sets - 10 reps - 3s hold   ASSESSMENT:  CLINICAL IMPRESSION: Pt is able to participate in all exercises during session today without an increase in her pain. Pt has cramps in L adductor with clamshell exs so decided to stop the exs as it was time to attend the MD call. Session focused on strengthening and stabilization. Pt introduced to closed chain exs. Tol Tx well but limited to day due to muscle cramps. Towards the end of the treatment. Pt verbalized desire to start gym and therefore, PT advised to creat a safe work out plan for Gym when she is ready to be discharge. Pt encouraged to continue HEP.  Pt continues to show weakness in LLE and possible radicular pain from L/S. She will benefit from PT services to address deficits in strength, range of motion, and pain in order to improve pain with household and work responsibilities.   OBJECTIVE IMPAIRMENTS: Abnormal gait, decreased ROM, decreased strength, and pain.   ACTIVITY LIMITATIONS: lifting, bending, standing, and squatting  PARTICIPATION LIMITATIONS: cleaning, shopping, community activity, and occupation  PERSONAL FACTORS: Past/current experiences, Time since onset of injury/illness/exacerbation, and 3+ comorbidities: depression, anxiety, migraines, chronic pain, and psoriatic arthritis  are also affecting patient's functional outcome.   REHAB POTENTIAL: Fair    CLINICAL DECISION MAKING: Unstable/unpredictable  EVALUATION COMPLEXITY:  High   GOALS: Goals reviewed with patient? No  SHORT TERM GOALS: Target date: 10/09/2022   Pt will be independent with HEP in order to improve strength and range of motion as well as decrease hip pain to improve function at home and work. Baseline:  Goal status: INITIAL   LONG TERM GOALS: Target date:  11/06/2022  Pt will increase FOTO to at least 66 to demonstrate significant improvement in function at home and work related to L hip pain  Baseline: 07/08/22: 52 Goal status: INITIAL  2.  Pt will decrease worst hip pain by at least 2 points on the NPRS in order to demonstrate clinically significant reduction in hip pain. Baseline: 07/08/22: worst: 10/10 Goal status: INITIAL  3.  Pt will report at least 25% improvement in L hip symptoms in order to demonstrate clinically significant reduction in hip pain/disability so she can return to increased frequency of exercise with less pain       Baseline: 09/25/22: achieved almost completely in response to injections, not as much due to PT interventions Goal status: INITIAL  4.  Pt will increase pain-free strength of L hip abduction/adduction by at least 1/2 MMT grade in order to demonstrate improvement in strength and function  Baseline: 07/08/22: 4/5 both with pain; 09/25/22: achieved Goal status: INITIAL   PLAN: PT FREQUENCY: 1-2x/week  PT DURATION: 8 weeks  PLANNED INTERVENTIONS: Therapeutic exercises, Therapeutic activity, Neuromuscular re-education, Balance training, Gait training, Patient/Family education, Self Care, Joint mobilization, Joint manipulation, Vestibular training, Canalith repositioning, Orthotic/Fit training, DME instructions, Dry Needling, Electrical stimulation, Spinal manipulation, Spinal mobilization, Cryotherapy, Moist heat, Taping, Traction, Ultrasound, Ionotophoresis 41m/ml Dexamethasone, Manual therapy, and Re-evaluation.  PLAN FOR NEXT SESSION: gym-based exercises   AJoaquin MusicPT DPT 3:45 PM,10/16/22

## 2022-10-16 NOTE — Progress Notes (Signed)
I attempted to call the patient however no response. Voicemail left instructing patient to call front desk office at [phone] to reschedule VV appointment. -Dr Holley Raring

## 2022-10-21 ENCOUNTER — Ambulatory Visit
Payer: Federal, State, Local not specified - PPO | Attending: Student in an Organized Health Care Education/Training Program | Admitting: Student in an Organized Health Care Education/Training Program

## 2022-10-21 ENCOUNTER — Ambulatory Visit
Payer: Federal, State, Local not specified - PPO | Admitting: Student in an Organized Health Care Education/Training Program

## 2022-10-21 DIAGNOSIS — M545 Low back pain, unspecified: Secondary | ICD-10-CM | POA: Diagnosis not present

## 2022-10-21 DIAGNOSIS — M79605 Pain in left leg: Secondary | ICD-10-CM

## 2022-10-21 DIAGNOSIS — M5416 Radiculopathy, lumbar region: Secondary | ICD-10-CM

## 2022-10-21 DIAGNOSIS — M48061 Spinal stenosis, lumbar region without neurogenic claudication: Secondary | ICD-10-CM

## 2022-10-21 NOTE — Progress Notes (Signed)
Patient: Barbara Faulkner  Service Category: E/M  Provider: Gillis Santa, MD  DOB: [birth year]  DOS: 10/21/2022  Location: Office  MRN: [id number]  Setting: Ambulatory outpatient  Referring Provider: Burnard Hawthorne, FNP  Type: Established Patient  Specialty: Interventional Pain Management  PCP: Burnard Hawthorne, FNP  Location: Remote location  Delivery: TeleHealth     Virtual Encounter - Pain Management PROVIDER NOTE: Information contained herein reflects review and annotations entered in association with encounter. Interpretation of such information and data should be left to medically-trained personnel. Information provided to patient can be located elsewhere in the medical record under "Patient Instructions". Document created using STT-dictation technology, any transcriptional errors that may result from process are unintentional.    Contact & Pharmacy Preferred: [phone] Home: [phone] (home) Mobile: [phone] (mobile) E-mail: [email or web address] [id number], [place] [address] Phone: [phone] Fax: [phone]   Pre-screening  Barbara Faulkner offered "in-person" vs "virtual" encounter. She indicated preferring virtual for this encounter.   Reason COVID-19*  Social distancing based on CDC and AMA recommendations.   I contacted Barbara Faulkner on 10/21/2022 via telephone.      I clearly identified myself as Gillis Santa, MD. I verified that I was speaking with the correct person using two identifiers (Name: Barbara Faulkner, and date of birth: April 22, 1966).  Consent I sought verbal advanced consent from Barbara Faulkner for virtual visit interactions. I informed Barbara Faulkner of possible security and privacy concerns, risks, and limitations associated with providing "not-in-person" medical evaluation and management services. I also informed Barbara Faulkner of the availability  of "in-person" appointments. Finally, I informed her that there would be a charge for the virtual visit and that she could be  personally, fully or partially, financially responsible for it. Barbara Faulkner expressed understanding and agreed to proceed.   Historic Elements   Barbara Faulkner is a 57 y.o. year old, female patient evaluated today after our last contact on 10/16/2022. Barbara Faulkner  has a past medical history of Allergy, Anxiety, Chronic kidney disease, Chronic pain, Chronic sinusitis, COVID-19, Depression, Heart murmur, Hiatal hernia, HPV (human papilloma virus) anogenital infection, Macromastia, Migraine, Psoriatic arthritis ([place]), Shingles, and UTI (urinary tract infection). She also  has a past surgical history that includes Cholecystectomy (2009); Tubal ligation (1997); Laparoscopic gastric banding (2008); Gastric bypass; Breast biopsy (Right, 01/28/2018); Colonoscopy with propofol (N/A, 07/19/2018); Esophagogastroduodenoscopy (egd) with propofol (N/A, 07/19/2018); Breast reduction surgery (Bilateral, 04/03/2020); Colonoscopy with propofol (N/A, 09/17/2021); Colonoscopy with propofol (N/A, 09/18/2021); and Reduction mammaplasty. Barbara Faulkner has a current medication list which includes the following prescription(s): calcitriol, cholecalciferol, cyclobenzaprine, duloxetine, gabapentin, lamotrigine, pantoprazole, prednisone, quetiapine, tramadol, and vitamin d, and the following Facility-Administered Medications: lidocaine (pf). She  reports that she quit smoking about 19 years ago. Her smoking use included cigarettes. She has a 40.00 pack-year smoking history. She has never used smokeless tobacco. She reports that she does not currently use alcohol. She reports that she does not use drugs. Barbara Faulkner is allergic to shellfish allergy and nsaids.  Estimated body mass index is 27.81 kg/m as calculated from the following:   Height as of 10/01/22: 5' 3"$  (1.6 m).   Weight as of 10/01/22: 157 lb (71.2  kg).  HPI  Today, she is being contacted for  review L-MRI  Barbara Faulkner endorses low back pain with radiation into her left lateral thigh  and above her knee in a dermatomal fashion consistent with left radiculopathy.  Her MRI below shows left foraminal stenosis at L4-L5.  She is on multimodal analgesics including Flexeril 5 mg nightly, Cymbalta 60 mg daily, gabapentin 400 mg nightly along with tramadol 200 mg ER daily.  She has attempted physical therapy exercises and tries to do lumbar strengthening and stretching exercises at home to help alleviate her pain.  We discussed a left L4-L5 transforaminal epidural steroid injection.  Risk and benefits reviewed and patient would like to proceed.  Laboratory Chemistry Profile   Renal Lab Results  Component Value Date   BUN 18 06/11/2022   CREATININE 1.10 [id number]   BCR NOT APPLICABLE 0000000   GFR 57.17 (L) 06/11/2022   GFRAA >60 02/15/2014   GFRNONAA 51 (L) 05/26/2022    Hepatic Lab Results  Component Value Date   AST 85 (H) 06/11/2022   ALT 90 (H) 06/11/2022   ALBUMIN 4.0 06/11/2022   ALKPHOS 150 (H) 06/11/2022   ALKPHOS 171 (H) 06/11/2022   AMYLASE 33 09/25/2014   LIPASE 124 10/04/2013    Electrolytes Lab Results  Component Value Date   NA 142 06/11/2022   K 4.3 06/11/2022   CL 110 06/11/2022   CALCIUM 8.7 06/11/2022   MG 1.9 07/14/2018   PHOS 5.0 (H) 09/25/2014    Bone Lab Results  Component Value Date   VD25OH 40.34 06/11/2022    Inflammation (CRP: Acute Phase) (ESR: Chronic Phase) Lab Results  Component Value Date   CRP <1.0 03/10/2019   ESRSEDRATE 3 03/10/2019         Note: Above Lab results reviewed.  Imaging  MR LUMBAR SPINE WO CONTRAST CLINICAL DATA:  Low back pain with left-sided radiculopathy  EXAM: MRI LUMBAR SPINE WITHOUT CONTRAST  TECHNIQUE: Multiplanar, multisequence MR imaging of the lumbar spine was performed. No intravenous contrast was administered.  COMPARISON:  X-ray  10/01/2022  FINDINGS: Segmentation:  Standard.  Alignment:  Physiologic.  Vertebrae:  No fracture, evidence of discitis, or bone lesion.  Conus medullaris and cauda equina: Conus extends to the L1-L2 level. Conus and cauda equina appear normal.  Paraspinal and other soft tissues: Negative.  Disc levels:  T12-L1 through L2-L3: Unremarkable.  L3-L4: Unremarkable disc. Mild bilateral facet arthropathy. Mild canal stenosis. No significant foraminal stenosis.  L4-L5: Mild annular disc bulge. Tiny posterior annular fissure. Left greater than right facet arthropathy. No canal stenosis. Mild left foraminal stenosis.  L5-S1: Unremarkable disc. Moderate bilateral facet arthropathy. No canal or foraminal stenosis.  IMPRESSION: Mild lumbar spondylosis with mild canal stenosis at L3-L4 and mild left foraminal stenosis at L4-L5.  Electronically Signed   By: Davina Poke D.O.   On: 10/14/2022 09:40  Assessment  The primary encounter diagnosis was Lumbar radicular pain. 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  Plan of Care  1. Lumbar radicular pain - Lumbar Transforaminal Epidural; Future  2. 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 Lumbar pain with radiation down left leg - Lumbar Transforaminal Epidural; Future  Continue with multimodal analgesics and home physical therapy exercises.  Plan for left L4-L5 transforaminal epidural steroid injection.  Orders:  Orders Placed This Encounter  Procedures   Lumbar Transforaminal Epidural    Standing Status:   Future    Standing Expiration Date:   01/19/2023    Scheduling Instructions:  Laterality: Left L4/5             Sedation: without     Timeframe: ASAP    Order Specific Question:   Where will this procedure be performed?    Answer:   [facility]    Follow-up plan:   Return for Left L4/5 TF ESI , in clinic NS.      Recent Visits Date Type Provider Dept  10/16/22 Office Visit Gillis Santa, MD [facility]  10/01/22 Office Visit Gillis Santa, MD [facility]  Showing recent visits within past 90 days and meeting all other requirements Today's Visits Date Type Provider Dept  10/21/22 Office Visit Gillis Santa, MD [facility]  Showing today's visits and meeting all other requirements Future Appointments Date Type Provider Dept  01/01/23 Appointment Gillis Santa, MD [facility]  Showing future appointments within next 90 days and meeting all other requirements  I discussed the assessment and treatment plan with the patient. The patient was provided an opportunity to ask questions and all were answered. The patient agreed with the plan and demonstrated an understanding of the instructions.  Patient advised to call back or seek an in-person evaluation if the symptoms or condition worsens.  Duration of encounter: 43mnutes.  Note by: BGillis Santa MD Date: 10/21/2022; Time: 1:55 PM

## 2022-10-23 ENCOUNTER — Ambulatory Visit: Payer: Federal, State, Local not specified - PPO

## 2022-10-23 DIAGNOSIS — M6281 Muscle weakness (generalized): Secondary | ICD-10-CM

## 2022-10-23 DIAGNOSIS — M25552 Pain in left hip: Secondary | ICD-10-CM | POA: Diagnosis not present

## 2022-10-23 NOTE — Therapy (Signed)
OUTPATIENT PHYSICAL THERAPY HIP TREATMENT  Patient Name: Barbara Faulkner MRN: [id number] DOB:[birth year], 57 y.o., female Today's Date: 10/23/2022   PT End of Session - 10/23/22 1323     Visit Number 15    Number of Visits 33    Date for PT Re-Evaluation 11/06/22    PT Start Time 1315    PT Stop Time 1400    PT Time Calculation (min) 45 min             Past Medical History:  Diagnosis Date   Allergy    Anxiety    Chronic kidney disease    Chronic pain    Chronic sinusitis    COVID-19    09/14/20, 06/13/21   Depression    Heart murmur    Hiatal hernia    HPV (human papilloma virus) anogenital infection    Macromastia    Migraine    Psoriatic arthritis ([place])    Shingles    UTI (urinary tract infection)    ecoli 09/2021   Past Surgical History:  Procedure Laterality Date   BREAST BIOPSY Right 01/28/2018   US guided biopsy - heart shaped   BREAST REDUCTION SURGERY Bilateral 04/03/2020   Procedure: MAMMARY REDUCTION  (BREAST);  Surgeon: Contogiannis, Audrea Muscat, MD;  Location: [place];  Service: Plastics;  Laterality: Bilateral;  HAVE LIPOSUCTION MACHINE AVAILABLE   CHOLECYSTECTOMY  2009   COLONOSCOPY WITH PROPOFOL N/A 07/19/2018   Procedure: COLONOSCOPY WITH PROPOFOL;  Surgeon: Lin Landsman, MD;  Location: [facility] ENDOSCOPY;  Service: Gastroenterology;  Laterality: N/A;   COLONOSCOPY WITH PROPOFOL N/A 09/17/2021   Procedure: COLONOSCOPY WITH PROPOFOL;  Surgeon: Lin Landsman, MD;  Location: [facility] ENDOSCOPY;  Service: Gastroenterology;  Laterality: N/A;   COLONOSCOPY WITH PROPOFOL N/A 09/18/2021   Procedure: COLONOSCOPY WITH PROPOFOL;  Surgeon: Lin Landsman, MD;  Location: [facility] ENDOSCOPY;  Service: Gastroenterology;  Laterality: N/A;   ESOPHAGOGASTRODUODENOSCOPY (EGD) WITH PROPOFOL N/A 07/19/2018   Procedure: ESOPHAGOGASTRODUODENOSCOPY (EGD) WITH PROPOFOL;  Surgeon: Lin Landsman, MD;  Location: [facility] ENDOSCOPY;  Service:  Gastroenterology;  Laterality: N/A;   GASTRIC BYPASS     2015; duodenal switch    LAPAROSCOPIC GASTRIC BANDING  2008   removed 2009   [place]   Patient Active Problem List   Diagnosis Date Noted   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) 10/21/2022   Lumbar radicular pain 10/01/2022   Fibromyalgia 10/01/2022   Normocytic anemia 06/13/2022   Elevated alkaline phosphatase level 05/28/2022   Chills 05/26/2022   Chronic left SI joint pain 11/13/2021   Piriformis syndrome, left 11/13/2021   Greater 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 11/13/2021   Primary osteoarthritis of right hip 09/16/2021   Plantar fasciitis 08/22/2021   Carpal tunnel syndrome on right 08/22/2021   Primary osteoarthritis of left hip 08/15/2021   Hyperphosphatemia 08/01/2021   Anemia in chronic kidney disease 04/18/2021   Chronic kidney disease, stage 3a ([place]) 04/18/2021   Hyperparathyroidism due to renal insufficiency ([place]) 04/18/2021   Hypertension 04/18/2021   Hiatal hernia 11/02/2020   History of migraine 11/02/2020   Gastroesophageal reflux disease 11/02/2020   Brain fog 11/02/2020   Depression, recurrent ([place]) 03/18/2020   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 09/08/2018   Arthropathy of right shoulder 09/08/2018   Controlled substance agreement signed 09/08/2018   Myofascial pain syndrome 08/24/2018   Routine physical examination 08/24/2018   Iron deficiency anemia 08/24/2018  Elevated liver enzymes 07/19/2018   Vitamin D deficiency 07/19/2018   Colon cancer screening    Abdominal pain, epigastric 07/08/2018   Right shoulder pain 06/15/2018   Hemorrhoids 05/01/2017   Anxiety and depression 11/24/2016   Lumbar pain with radiation down left leg 09/11/2016   Large breasts 09/11/2016   Parotiditis 05/01/2016   Abnormal laboratory test result 03/26/2016   Proteinuria 03/26/2016   Right hip pain 04/18/2015   Sinusitis 01/08/2014    Psoriatic arthritis ([place]) 09/13/2013   DUB (dysfunctional uterine bleeding) 10/27/2012   Gastritis due to nonsteroidal anti-inflammatory drug 12/12/2011   Anaphylactic reaction due to shellfish 09/27/2010   THYROMEGALY 09/03/2010   ALLERGIC RHINITIS 03/15/2010   MIGRAINE Elio Forget W/O INTRACT W/O STATUS MIGRNOSUS 05/17/2008   PCP: [name] FNP  REFERRING PROVIDER: Rosette Reveal MD  REFERRING DIAG: [phone] (ICD-10-CM) - Primary osteoarthritis of left hip, M53.3,G89.29 (ICD-10-CM) - Chronic left SI joint pain, G57.02 (ICD-10-CM) - Piriformis syndrome, left, M25.552 (ICD-10-CM) - Greater 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  Rationale for Evaluation and Treatment: Rehabilitation  THERAPY DIAG: Pain in left hip  Muscle weakness (generalized)  ONSET DATE: 09/02/2015 (approximate)  FOLLOW-UP APPT SCHEDULED WITH REFERRING PROVIDER: Yes   FROM INITIAL EVALUATION SUBJECTIVE:                                                                                                                                                                                         SUBJECTIVE STATEMENT: L hip pain  PERTINENT HISTORY:  Pt reports a diagnosis of Lyme Disease in 2017 with persistent widespread chronic joint pain after treatment.  Of note she has severe L hip pain as well as pain in her L knee and right shoulder. Her L hip pain has been worsening recently. She denies any pain in her R hip or L shoulder. She was previously receiving L hip injections from Dr. Mack Guise however she reports that he refused to continue with further injections so she was referred by pain management to Dr. Zigmund Daniel. She saw Dr. Zigmund Daniel and pt received a L hip intraarticular injection as well as an injection in her L SIJ and L greater trochanter. The injections have helped with her pain. She was also referred to an orthopedic surgeon to discuss THR and referred for physical therapy. Pt expresses that she really  enjoys exercising but is very limited by her pain.   PAIN:  Low today, posterior thigh and later hip  PRECAUTIONS: None  WEIGHT BEARING RESTRICTIONS: No  FALLS: Has patient fallen in last 6 months? No  OBJECTIVE:   LE MMT: MMT (out of 5) Right   Left  Right 1/25 Left 1/25   Hip flexion 5 4+* 5/5 5/5  Hip extension (prone)     5/5 (5/5)  Hip abduction 4+ 4* 4+/5 4/5  Hip horizonal ABDCT/ADD   5/5 5/5  Hip adduction 4+ 4*    Hip internal rotation 5 5 5/5 4+/5  Hip external rotation 5 4* 5/5 4+/5  Knee flexion 4+ 4+* 5/5 4+/5  Knee extension 5 5 5/5 5/5  Ankle dorsiflexion 5 5 5/5 5/5    TODAY'S TREATMENT   SUBJECTIVE: Pt reports that her hip injection still feels helpful.  She is focusing on strengthening for her LE right now.  No specific questions or concerns currently.   PAIN: Mild L hip pain;   Ther-ex  NuStep Level 3-4 x 10 minutes for warm-up during interval history, then focused on keeping step count >100 for LE muscle endurance/strength  TG squats: 3x12 (level 22) TG heel raises: 2x10 (level 22) Bridges 5 second holds [employer] with BTB while marching 4 x 20 secs ( core engaged) Calm shell with BTB 2 x 60 secs Standing L lateral step down (6 inch step), focusing on level pelvis in frontal plane, b/l UE support required: 2x8 Lateral walk with nautilus: 60 lbs, strap around pelvis, 4 laps R and L   Not performed: SL bridges with BTB 2 x 60 secs Hooklying clams with manual resistance 2 x 15; Hooklying adductor squeeze with manual resistance 2 x 15; Supine LLE heel slide with manually resisted extension with pt pushing into therapist x 10; R sidelying L hip straight leg abduction 2 x 10; R sidelying L hip reverse clam 2 x 10;    PATIENT EDUCATION:  Education details: Pt educated throughout session about proper posture and technique with exercises. Improved exercise technique, movement at target joints, use of target muscles after min to mod verbal,  visual, tactile cues Person educated: Patient Education method: Explanation  Education comprehension: verbalized understanding   HOME EXERCISE PROGRAM:  Access Code: [email or web address] URL: [email or web address] Date: 07/17/2022 Prepared by: Roxana Hires  Exercises - Hooklying Lumbar Rotation  - 1 x daily - 7 x weekly - 2 sets - 10 reps - 3s hold - Supine March  - 1 x daily - 7 x weekly - 2 sets - 10 reps - 3s hold - Supine Bridge  - 1 x daily - 7 x weekly - 2 sets - 10 reps - 3s hold   ASSESSMENT:  CLINICAL IMPRESSION: Pt was able to tolerate progression of LE strengthening today in CKC positions without reporting onset of L hip pain during or after session.  Addition of eccentric glute training with lateral step down was quite challenging, required b/l UE support.  She will benefit from PT services to address deficits in strength, range of motion, and pain in order to improve pain with household and work responsibilities.   OBJECTIVE IMPAIRMENTS: Abnormal gait, decreased ROM, decreased strength, and pain.   ACTIVITY LIMITATIONS: lifting, bending, standing, and squatting  PARTICIPATION LIMITATIONS: cleaning, shopping, community activity, and occupation  PERSONAL FACTORS: Past/current experiences, Time since onset of injury/illness/exacerbation, and 3+ comorbidities: depression, anxiety, migraines, chronic pain, and psoriatic arthritis  are also affecting patient's functional outcome.   REHAB POTENTIAL: Fair    CLINICAL DECISION MAKING: Unstable/unpredictable  EVALUATION COMPLEXITY: High   GOALS: Goals reviewed with patient? No  SHORT TERM GOALS: Target date: 10/09/2022   Pt will be independent with HEP in order to improve strength and range of motion as well as decrease  hip pain to improve function at home and work. Baseline:  Goal status: INITIAL   LONG TERM GOALS: Target date: 11/06/2022  Pt will increase FOTO to at least 66 to demonstrate significant  improvement in function at home and work related to L hip pain  Baseline: 07/08/22: 52 Goal status: INITIAL  2.  Pt will decrease worst hip pain by at least 2 points on the NPRS in order to demonstrate clinically significant reduction in hip pain. Baseline: 07/08/22: worst: 10/10 Goal status: INITIAL  3.  Pt will report at least 25% improvement in L hip symptoms in order to demonstrate clinically significant reduction in hip pain/disability so she can return to increased frequency of exercise with less pain       Baseline: 09/25/22: achieved almost completely in response to injections, not as much due to PT interventions Goal status: INITIAL  4.  Pt will increase pain-free strength of L hip abduction/adduction by at least 1/2 MMT grade in order to demonstrate improvement in strength and function  Baseline: 07/08/22: 4/5 both with pain; 09/25/22: achieved Goal status: INITIAL   PLAN: PT FREQUENCY: 1-2x/week  PT DURATION: 8 weeks  PLANNED INTERVENTIONS: Therapeutic exercises, Therapeutic activity, Neuromuscular re-education, Balance training, Gait training, Patient/Family education, Self Care, Joint mobilization, Joint manipulation, Vestibular training, Canalith repositioning, Orthotic/Fit training, DME instructions, Dry Needling, Electrical stimulation, Spinal manipulation, Spinal mobilization, Cryotherapy, Moist heat, Taping, Traction, Ultrasound, Ionotophoresis 51m/ml Dexamethasone, Manual therapy, and Re-evaluation.  PLAN FOR NEXT SESSION: continue LE/hip strengthening  RMerdis Delay PT, DPT, [place] #[id number]  4:13 PM,10/23/22

## 2022-10-28 ENCOUNTER — Ambulatory Visit: Payer: Federal, State, Local not specified - PPO

## 2022-10-28 DIAGNOSIS — M25552 Pain in left hip: Secondary | ICD-10-CM

## 2022-10-28 DIAGNOSIS — M6281 Muscle weakness (generalized): Secondary | ICD-10-CM | POA: Diagnosis not present

## 2022-10-28 NOTE — Therapy (Signed)
OUTPATIENT PHYSICAL THERAPY HIP TREATMENT  Patient Name: KYNDALL DROWN MRN: [id number] DOB:May 03, 1966, 57 y.o., female Today's Date: 10/29/2022   PT End of Session - 10/28/22 1329     Visit Number 16    Number of Visits 33    Date for PT Re-Evaluation 11/06/22    Authorization Type eval: 07/08/22    Authorization Time Period BC[employer] 2024 VL:75 combined visits per year    Authorization - Visit Number 5    Authorization - Number of Visits 24    PT Start Time [id number]    PT Stop Time 1405    PT Time Calculation (min) 40 min    Activity Tolerance Patient tolerated treatment well    Behavior During Therapy [facility] for tasks assessed/performed            Past Medical History:  Diagnosis Date   Allergy    Anxiety    Chronic kidney disease    Chronic pain    Chronic sinusitis    COVID-19    09/14/20, 06/13/21   Depression    Heart murmur    Hiatal hernia    HPV (human papilloma virus) anogenital infection    Macromastia    Migraine    Psoriatic arthritis ([place])    Shingles    UTI (urinary tract infection)    ecoli 09/2021   Past Surgical History:  Procedure Laterality Date   BREAST BIOPSY Right 01/28/2018   US guided biopsy - heart shaped   BREAST REDUCTION SURGERY Bilateral 04/03/2020   Procedure: MAMMARY REDUCTION  (BREAST);  Surgeon: Contogiannis, Audrea Muscat, MD;  Location: [place];  Service: Plastics;  Laterality: Bilateral;  HAVE LIPOSUCTION MACHINE AVAILABLE   CHOLECYSTECTOMY  2009   COLONOSCOPY WITH PROPOFOL N/A 07/19/2018   Procedure: COLONOSCOPY WITH PROPOFOL;  Surgeon: Lin Landsman, MD;  Location: [facility] ENDOSCOPY;  Service: Gastroenterology;  Laterality: N/A;   COLONOSCOPY WITH PROPOFOL N/A 09/17/2021   Procedure: COLONOSCOPY WITH PROPOFOL;  Surgeon: Lin Landsman, MD;  Location: [facility] ENDOSCOPY;  Service: Gastroenterology;  Laterality: N/A;   COLONOSCOPY WITH PROPOFOL N/A 09/18/2021   Procedure: COLONOSCOPY WITH PROPOFOL;  Surgeon: Lin Landsman, MD;  Location: [facility] ENDOSCOPY;  Service: Gastroenterology;  Laterality: N/A;   ESOPHAGOGASTRODUODENOSCOPY (EGD) WITH PROPOFOL N/A 07/19/2018   Procedure: ESOPHAGOGASTRODUODENOSCOPY (EGD) WITH PROPOFOL;  Surgeon: Lin Landsman, MD;  Location: [facility] ENDOSCOPY;  Service: Gastroenterology;  Laterality: N/A;   GASTRIC BYPASS     2015; duodenal switch    LAPAROSCOPIC GASTRIC BANDING  2008   removed 2009   [place]   Patient Active Problem List   Diagnosis Date Noted   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) 10/21/2022   Lumbar radicular pain 10/01/2022   Fibromyalgia 10/01/2022   Normocytic anemia 06/13/2022   Elevated alkaline phosphatase level 05/28/2022   Chills 05/26/2022   Chronic left SI joint pain 11/13/2021   Piriformis syndrome, left 11/13/2021   Greater 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 11/13/2021   Primary osteoarthritis of right hip 09/16/2021   Plantar fasciitis 08/22/2021   Carpal tunnel syndrome on right 08/22/2021   Primary osteoarthritis of left hip 08/15/2021   Hyperphosphatemia 08/01/2021   Anemia in chronic kidney disease 04/18/2021   Chronic kidney disease, stage 3a ([place]) 04/18/2021   Hyperparathyroidism due to renal insufficiency ([place]) 04/18/2021   Hypertension 04/18/2021   Hiatal hernia 11/02/2020   History of migraine 11/02/2020   Gastroesophageal  reflux disease 11/02/2020   Brain fog 11/02/2020   Depression, recurrent ([place]) 03/18/2020   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 09/08/2018   Arthropathy of right shoulder 09/08/2018   Controlled substance agreement signed 09/08/2018   Myofascial pain syndrome 08/24/2018   Routine physical examination 08/24/2018   Iron deficiency anemia 08/24/2018   Elevated liver enzymes 07/19/2018   Vitamin D deficiency 07/19/2018   Colon cancer screening    Abdominal pain, epigastric 07/08/2018   Right shoulder pain 06/15/2018   Hemorrhoids  05/01/2017   Anxiety and depression 11/24/2016   Lumbar pain with radiation down left leg 09/11/2016   Large breasts 09/11/2016   Parotiditis 05/01/2016   Abnormal laboratory test result 03/26/2016   Proteinuria 03/26/2016   Right hip pain 04/18/2015   Sinusitis 01/08/2014   Psoriatic arthritis ([place]) 09/13/2013   DUB (dysfunctional uterine bleeding) 10/27/2012   Gastritis due to nonsteroidal anti-inflammatory drug 12/12/2011   Anaphylactic reaction due to shellfish 09/27/2010   THYROMEGALY 09/03/2010   ALLERGIC RHINITIS 03/15/2010   MIGRAINE Elio Forget W/O INTRACT W/O STATUS MIGRNOSUS 05/17/2008   PCP: [name] FNP  REFERRING PROVIDER: Rosette Reveal MD  REFERRING DIAG: [phone] (ICD-10-CM) - Primary osteoarthritis of left hip, M53.3,G89.29 (ICD-10-CM) - Chronic left SI joint pain, G57.02 (ICD-10-CM) - Piriformis syndrome, left, M25.552 (ICD-10-CM) - Greater 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  Rationale for Evaluation and Treatment: Rehabilitation  THERAPY DIAG: Pain in left hip  Muscle weakness (generalized)  ONSET DATE: 09/02/2015 (approximate)  FOLLOW-UP APPT SCHEDULED WITH REFERRING PROVIDER: Yes   FROM INITIAL EVALUATION SUBJECTIVE:                                                                                                                                                                                         SUBJECTIVE STATEMENT: L hip pain  PERTINENT HISTORY:  Pt reports a diagnosis of Lyme Disease in 2017 with persistent widespread chronic joint pain after treatment.  Of note she has severe L hip pain as well as pain in her L knee and right shoulder. Her L hip pain has been worsening recently. She denies any pain in her R hip or L shoulder. She was previously receiving L hip injections from Dr. Mack Guise however she reports that he refused to continue with further injections so she was referred by pain management to Dr. Zigmund Daniel. She saw Dr. Zigmund Daniel  and pt received a L hip intraarticular injection as well as an injection in her L SIJ and L greater trochanter. The injections have helped with her pain. She was also referred to an orthopedic surgeon to discuss THR and referred for physical therapy. Pt  expresses that she really enjoys exercising but is very limited by her pain.   PAIN:  Low today, posterior thigh and later hip  PRECAUTIONS: None  WEIGHT BEARING RESTRICTIONS: No  FALLS: Has patient fallen in last 6 months? No  OBJECTIVE:   LE MMT: MMT (out of 5) Right   Left   Right 1/25 Left 1/25   Hip flexion 5 4+* 5/5 5/5  Hip extension (prone)     5/5 (5/5)  Hip abduction 4+ 4* 4+/5 4/5  Hip horizonal ABDCT/ADD   5/5 5/5  Hip adduction 4+ 4*    Hip internal rotation 5 5 5/5 4+/5  Hip external rotation 5 4* 5/5 4+/5  Knee flexion 4+ 4+* 5/5 4+/5  Knee extension 5 5 5/5 5/5  Ankle dorsiflexion 5 5 5/5 5/5    TODAY'S TREATMENT   SUBJECTIVE: Pt reports that her hip injection still feels helpful. However she has had a lot of back pain recently. She is scheduled for an epidural spinal injection on Monday March 11th. She is focusing on strengthening for her LE right now.  No specific questions or concerns currently.   PAIN: 6/10 L hip pain, mild low back pain (not rated);   Ther-ex  NuStep Level 3-4 x 5 minutes for warm-up during interval history, then focused on keeping step count >100 for LE muscle endurance/strength  Total Gym (TG) Level 22 (L22) squats x 12, added 2, 5# dumbbells (DB) 2 x 12; TG L22 bilateral heel raises with 2, 5# DB 2 x 12; Bridges x 15; Bridges with BTB while marching 2 x 10; Calms with manual resistance from therapist 2 x 10; Adductor squeezes with manual resistance 2 x 10;   Manual Therapy  Belt assisted: Supine L hip long axis gentle distraction with belt assist, 30s/bout x 3 bouts; Supine L hip inferior mobilizations with belt assist and hip flexed to 90 degrees 30s/bout x 3 bouts; Supine L  hip gentle medial to lateral mobilizations with hip flexed to 45 degrees, belt assist, 30s/bout x 3 bouts;    Not performed: Supine LLE heel slide with manually resisted extension with pt pushing into therapist x 10; R sidelying L hip straight leg abduction 2 x 10; R sidelying L hip reverse clam 2 x 10;   PATIENT EDUCATION:  Education details: Pt educated throughout session about proper posture and technique with exercises. Improved exercise technique, movement at target joints, use of target muscles after min to mod verbal, visual, tactile cues Person educated: Patient Education method: Explanation  Education comprehension: verbalized understanding   HOME EXERCISE PROGRAM:  Access Code: [email or web address] URL: [email or web address] Date: 07/17/2022 Prepared by: Roxana Hires  Exercises - Hooklying Lumbar Rotation  - 1 x daily - 7 x weekly - 2 sets - 10 reps - 3s hold - Supine March  - 1 x daily - 7 x weekly - 2 sets - 10 reps - 3s hold - Supine Bridge  - 1 x daily - 7 x weekly - 2 sets - 10 reps - 3s hold   ASSESSMENT:  CLINICAL IMPRESSION: Pt was able to tolerate progression of LE strengthening today. She denies any increase in her pain throughout session. Repeated brief belt assisted L hip mobilizations to help modulate pain and improve range of motion. Plan to progress strengthening at future sessions including additional CKC exercises. She will benefit from PT services to address deficits in strength, range of motion, and pain in order to improve pain with household and work responsibilities.  OBJECTIVE IMPAIRMENTS: Abnormal gait, decreased ROM, decreased strength, and pain.   ACTIVITY LIMITATIONS: lifting, bending, standing, and squatting  PARTICIPATION LIMITATIONS: cleaning, shopping, community activity, and occupation  PERSONAL FACTORS: Past/current experiences, Time since onset of injury/illness/exacerbation, and 3+ comorbidities: depression, anxiety, migraines,  chronic pain, and psoriatic arthritis  are also affecting patient's functional outcome.   REHAB POTENTIAL: Fair    CLINICAL DECISION MAKING: Unstable/unpredictable  EVALUATION COMPLEXITY: High   GOALS: Goals reviewed with patient? No  SHORT TERM GOALS: Target date: 10/09/2022   Pt will be independent with HEP in order to improve strength and range of motion as well as decrease hip pain to improve function at home and work. Baseline:  Goal status: INITIAL   LONG TERM GOALS: Target date: 11/06/2022  Pt will increase FOTO to at least 66 to demonstrate significant improvement in function at home and work related to L hip pain  Baseline: 07/08/22: 52 Goal status: INITIAL  2.  Pt will decrease worst hip pain by at least 2 points on the NPRS in order to demonstrate clinically significant reduction in hip pain. Baseline: 07/08/22: worst: 10/10 Goal status: INITIAL  3.  Pt will report at least 25% improvement in L hip symptoms in order to demonstrate clinically significant reduction in hip pain/disability so she can return to increased frequency of exercise with less pain       Baseline: 09/25/22: achieved almost completely in response to injections, not as much due to PT interventions Goal status: INITIAL  4.  Pt will increase pain-free strength of L hip abduction/adduction by at least 1/2 MMT grade in order to demonstrate improvement in strength and function  Baseline: 07/08/22: 4/5 both with pain; 09/25/22: achieved Goal status: INITIAL   PLAN: PT FREQUENCY: 1-2x/week  PT DURATION: 8 weeks  PLANNED INTERVENTIONS: Therapeutic exercises, Therapeutic activity, Neuromuscular re-education, Balance training, Gait training, Patient/Family education, Self Care, Joint mobilization, Joint manipulation, Vestibular training, Canalith repositioning, Orthotic/Fit training, DME instructions, Dry Needling, Electrical stimulation, Spinal manipulation, Spinal mobilization, Cryotherapy, Moist heat, Taping,  Traction, Ultrasound, Ionotophoresis '4mg'$ /ml Dexamethasone, Manual therapy, and Re-evaluation.  PLAN FOR NEXT SESSION: continue LE/hip strengthening  Phillips Grout PT, DPT, GCS  11:41 AM,10/29/22

## 2022-11-04 ENCOUNTER — Ambulatory Visit: Payer: Federal, State, Local not specified - PPO | Attending: Family Medicine

## 2022-11-04 DIAGNOSIS — M25552 Pain in left hip: Secondary | ICD-10-CM | POA: Diagnosis not present

## 2022-11-04 DIAGNOSIS — M6281 Muscle weakness (generalized): Secondary | ICD-10-CM | POA: Insufficient documentation

## 2022-11-04 NOTE — Therapy (Unsigned)
OUTPATIENT PHYSICAL THERAPY HIP TREATMENT/RECERTIFICATION  Patient Name: Barbara Faulkner MRN: [id number] DOB:[birth year], 57 y.o., female Today's Date: 11/05/2022   PT End of Session - 11/04/22 1314     Visit Number 17    Number of Visits 49    Date for PT Re-Evaluation 12/31/22    Authorization Type eval: 07/08/22    Authorization Time Period [employer] 2024 VL:75 combined visits per year    Authorization - Visit Number 6    Authorization - Number of Visits 2    PT Start Time [id number]    PT Stop Time 1400    PT Time Calculation (min) 45 min    Activity Tolerance Patient tolerated treatment well    Behavior During Therapy [facility] for tasks assessed/performed            Past Medical History:  Diagnosis Date   Allergy    Anxiety    Chronic kidney disease    Chronic pain    Chronic sinusitis    COVID-19    09/14/20, 06/13/21   Depression    Heart murmur    Hiatal hernia    HPV (human papilloma virus) anogenital infection    Macromastia    Migraine    Psoriatic arthritis ([place])    Shingles    UTI (urinary tract infection)    ecoli 09/2021   Past Surgical History:  Procedure Laterality Date   BREAST BIOPSY Right 01/28/2018   US guided biopsy - heart shaped   BREAST REDUCTION SURGERY Bilateral 04/03/2020   Procedure: MAMMARY REDUCTION  (BREAST);  Surgeon: Contogiannis, Audrea Muscat, MD;  Location: [place];  Service: Plastics;  Laterality: Bilateral;  HAVE LIPOSUCTION MACHINE AVAILABLE   CHOLECYSTECTOMY  2009   COLONOSCOPY WITH PROPOFOL N/A 07/19/2018   Procedure: COLONOSCOPY WITH PROPOFOL;  Surgeon: Lin Landsman, MD;  Location: [facility] ENDOSCOPY;  Service: Gastroenterology;  Laterality: N/A;   COLONOSCOPY WITH PROPOFOL N/A 09/17/2021   Procedure: COLONOSCOPY WITH PROPOFOL;  Surgeon: Lin Landsman, MD;  Location: [facility] ENDOSCOPY;  Service: Gastroenterology;  Laterality: N/A;   COLONOSCOPY WITH PROPOFOL N/A 09/18/2021   Procedure: COLONOSCOPY WITH PROPOFOL;   Surgeon: Lin Landsman, MD;  Location: [facility] ENDOSCOPY;  Service: Gastroenterology;  Laterality: N/A;   ESOPHAGOGASTRODUODENOSCOPY (EGD) WITH PROPOFOL N/A 07/19/2018   Procedure: ESOPHAGOGASTRODUODENOSCOPY (EGD) WITH PROPOFOL;  Surgeon: Lin Landsman, MD;  Location: [facility] ENDOSCOPY;  Service: Gastroenterology;  Laterality: N/A;   GASTRIC BYPASS     2015; duodenal switch    LAPAROSCOPIC GASTRIC BANDING  2008   removed 2009   [place]   Patient Active Problem List   Diagnosis Date Noted   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) 10/21/2022   Lumbar radicular pain 10/01/2022   Fibromyalgia 10/01/2022   Normocytic anemia 06/13/2022   Elevated alkaline phosphatase level 05/28/2022   Chills 05/26/2022   Chronic left SI joint pain 11/13/2021   Piriformis syndrome, left 11/13/2021   Greater 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 11/13/2021   Primary osteoarthritis of right hip 09/16/2021   Plantar fasciitis 08/22/2021   Carpal tunnel syndrome on right 08/22/2021   Primary osteoarthritis of left hip 08/15/2021   Hyperphosphatemia 08/01/2021   Anemia in chronic kidney disease 04/18/2021   Chronic kidney disease, stage 3a ([place]) 04/18/2021   Hyperparathyroidism due to renal insufficiency ([place]) 04/18/2021   Hypertension 04/18/2021   Hiatal hernia 11/02/2020   History of migraine 11/02/2020   Gastroesophageal  reflux disease 11/02/2020   Brain fog 11/02/2020   Depression, recurrent ([place]) 03/18/2020   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 09/08/2018   Arthropathy of right shoulder 09/08/2018   Controlled substance agreement signed 09/08/2018   Myofascial pain syndrome 08/24/2018   Routine physical examination 08/24/2018   Iron deficiency anemia 08/24/2018   Elevated liver enzymes 07/19/2018   Vitamin D deficiency 07/19/2018   Colon cancer screening    Abdominal pain, epigastric 07/08/2018   Right shoulder pain 06/15/2018    Hemorrhoids 05/01/2017   Anxiety and depression 11/24/2016   Lumbar pain with radiation down left leg 09/11/2016   Large breasts 09/11/2016   Parotiditis 05/01/2016   Abnormal laboratory test result 03/26/2016   Proteinuria 03/26/2016   Right hip pain 04/18/2015   Sinusitis 01/08/2014   Psoriatic arthritis ([place]) 09/13/2013   DUB (dysfunctional uterine bleeding) 10/27/2012   Gastritis due to nonsteroidal anti-inflammatory drug 12/12/2011   Anaphylactic reaction due to shellfish 09/27/2010   THYROMEGALY 09/03/2010   ALLERGIC RHINITIS 03/15/2010   MIGRAINE Elio Forget W/O INTRACT W/O STATUS MIGRNOSUS 05/17/2008   PCP: [name] FNP  REFERRING PROVIDER: Rosette Reveal MD  REFERRING DIAG: [phone] (ICD-10-CM) - Primary osteoarthritis of left hip, M53.3,G89.29 (ICD-10-CM) - Chronic left SI joint pain, G57.02 (ICD-10-CM) - Piriformis syndrome, left, M25.552 (ICD-10-CM) - Greater 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  Rationale for Evaluation and Treatment: Rehabilitation  THERAPY DIAG: Pain in left hip  Muscle weakness (generalized)  ONSET DATE: 09/02/2015 (approximate)  FOLLOW-UP APPT SCHEDULED WITH REFERRING PROVIDER: Yes   FROM INITIAL EVALUATION SUBJECTIVE:                                                                                                                                                                                         SUBJECTIVE STATEMENT: L hip pain  PERTINENT HISTORY:  Pt reports a diagnosis of Lyme Disease in 2017 with persistent widespread chronic joint pain after treatment.  Of note she has severe L hip pain as well as pain in her L knee and right shoulder. Her L hip pain has been worsening recently. She denies any pain in her R hip or L shoulder. She was previously receiving L hip injections from Dr. Mack Guise however she reports that he refused to continue with further injections so she was referred by pain management to Dr. Zigmund Daniel. She  saw Dr. Zigmund Daniel and pt received a L hip intraarticular injection as well as an injection in her L SIJ and L greater trochanter. The injections have helped with her pain. She was also referred to an orthopedic surgeon to discuss THR and referred for physical therapy. Pt  expresses that she really enjoys exercising but is very limited by her pain.   PAIN:  Low today, posterior thigh and later hip  PRECAUTIONS: None  WEIGHT BEARING RESTRICTIONS: No  FALLS: Has patient fallen in last 6 months? No  OBJECTIVE:   LE MMT: MMT (out of 5) Right   Left   Right 1/25 Left 1/25   Hip flexion 5 4+* 5/5 5/5  Hip extension (prone)     5/5 (5/5)  Hip abduction 4+ 4* 4+/5 4/5  Hip horizonal ABDCT/ADD   5/5 5/5  Hip adduction 4+ 4*    Hip internal rotation 5 5 5/5 4+/5  Hip external rotation 5 4* 5/5 4+/5  Knee flexion 4+ 4+* 5/5 4+/5  Knee extension 5 5 5/5 5/5  Ankle dorsiflexion 5 5 5/5 5/5    TODAY'S TREATMENT   SUBJECTIVE: Pt reports continued progress with her L hip strength.  She is looking at joining a gym to progress he strengthening. Her low back pain has persisted and she is scheduled for an epidural spinal injection on Monday March 11th. No specific questions or concerns currently.   PAIN: 6/10 low back pain;   Ther-ex  SciFit Level 4 x 5 minutes for warm-up during interval history; Total Gym (TG) Level 22 (L22) squats 6# dumbbells (DB) 2 x 20; TG L22 bilateral heel raises with 2, 6# DB 2 x 20; Resisted side stepping with blue tband around ankles 30' x 4 lengths with rest after first 2; Bridges with blue tband 3s hold x 10; Bridges with TB while marching 2 x 10; Clams with blue tband resistance 2 x 10; Adductor ball squeezes 2 x 10; Supine LLE heel slide with manually resisted extension with pt pushing into therapist x 10; Supine LLE straight leg hip abduction/adduction with manual resistance from therapist 2 x 10 each; Updated outcome measures with patient (see goal  section);   Not performed: R sidelying L hip straight leg abduction 2 x 10; R sidelying L hip reverse clam 2 x 10;   PATIENT EDUCATION:  Education details: Pt educated throughout session about proper posture and technique with exercises. Improved exercise technique, movement at target joints, use of target muscles after min to mod verbal, visual, tactile cues Person educated: Patient Education method: Explanation  Education comprehension: verbalized understanding   HOME EXERCISE PROGRAM:  Access Code: [email or web address] URL: [email or web address] Date: 07/17/2022 Prepared by: Roxana Hires  Exercises - Hooklying Lumbar Rotation  - 1 x daily - 7 x weekly - 2 sets - 10 reps - 3s hold - Supine March  - 1 x daily - 7 x weekly - 2 sets - 10 reps - 3s hold - Supine Bridge  - 1 x daily - 7 x weekly - 2 sets - 10 reps - 3s hold   ASSESSMENT:  CLINICAL IMPRESSION: Pt was able to tolerate progression of LE strengthening today without an excessive increase in pain. [name] Updated outcome measures during visit. Her FOTO score improved from 52 to 54. After her L hip injection she reported almost full resolution of her pain however over the last couple weeks it has gradually increased. However it is still very well managed. Her worst pain over the last week was 5/10 in the L hip compared to 10/10 at the initial evaluation. She is looking to join a local gym to continue her strengthening but would like to continue therapy for exercise progression. She has a lumbar ESI scheduled for next week with pain management. Plan  to progress strengthening at future sessions including additional CKC exercises. She will benefit from PT services to address deficits in strength, range of motion, and pain in order to improve pain with household and work responsibilities.   OBJECTIVE IMPAIRMENTS: Abnormal gait, decreased ROM, decreased strength, and pain.   ACTIVITY LIMITATIONS: lifting, bending, standing, and  squatting  PARTICIPATION LIMITATIONS: cleaning, shopping, community activity, and occupation  PERSONAL FACTORS: Past/current experiences, Time since onset of injury/illness/exacerbation, and 3+ comorbidities: depression, anxiety, migraines, chronic pain, and psoriatic arthritis  are also affecting patient's functional outcome.   REHAB POTENTIAL: Fair    CLINICAL DECISION MAKING: Unstable/unpredictable  EVALUATION COMPLEXITY: High   GOALS: Goals reviewed with patient? No  SHORT TERM GOALS: Target date: 12/02/2022   Pt will be independent with HEP in order to improve strength and range of motion as well as decrease hip pain to improve function at home and work. Baseline:  Goal status: INITIAL   LONG TERM GOALS: Target date: 12/30/2022   Pt will increase FOTO to at least 66 to demonstrate significant improvement in function at home and work related to L hip pain  Baseline: 07/08/22: 52; 11/05/22: 54; Goal status: PARTIALLY MET;  2.  Pt will decrease worst hip pain by at least 2 points on the NPRS in order to demonstrate clinically significant reduction in hip pain. Baseline: 07/08/22: worst: 10/10; 11/04/22: 5/10; Goal status: ACHIEVED;  3.  Pt will report at least 25% improvement in L hip symptoms in order to demonstrate clinically significant reduction in hip pain/disability so she can return to increased frequency of exercise with less pain       Baseline: 09/25/22: achieved almost completely in response to injections, not as much due to PT interventions Goal status: ACHIEVED  4.  Pt will increase pain-free strength of L hip abduction/adduction by at least 1/2 MMT grade in order to demonstrate improvement in strength and function  Baseline: 07/08/22: 4/5 both with pain; 09/25/22: ACHIEVED Goal status: ACHIEVED   PLAN: PT FREQUENCY: 1-2x/week  PT DURATION: 8 weeks  PLANNED INTERVENTIONS: Therapeutic exercises, Therapeutic activity, Neuromuscular re-education, Balance training, Gait  training, Patient/Family education, Self Care, Joint mobilization, Joint manipulation, Vestibular training, Canalith repositioning, Orthotic/Fit training, DME instructions, Dry Needling, Electrical stimulation, Spinal manipulation, Spinal mobilization, Cryotherapy, Moist heat, Taping, Traction, Ultrasound, Ionotophoresis '4mg'$ /ml Dexamethasone, Manual therapy, and Re-evaluation.  PLAN FOR NEXT SESSION: continue LE/hip strengthening  Phillips Grout PT, DPT, GCS  8:42 AM,11/05/22

## 2022-11-10 ENCOUNTER — Ambulatory Visit
Payer: Federal, State, Local not specified - PPO | Attending: Student in an Organized Health Care Education/Training Program | Admitting: Student in an Organized Health Care Education/Training Program

## 2022-11-10 ENCOUNTER — Encounter: Payer: Self-pay | Admitting: Student in an Organized Health Care Education/Training Program

## 2022-11-10 ENCOUNTER — Ambulatory Visit
Admission: RE | Admit: 2022-11-10 | Discharge: 2022-11-10 | Disposition: A | Discharge status: Home / Self Care | Payer: Federal, State, Local not specified - PPO | Source: Ambulatory Visit | Attending: Student in an Organized Health Care Education/Training Program | Admitting: Student in an Organized Health Care Education/Training Program

## 2022-11-10 DIAGNOSIS — M48061 Spinal stenosis, lumbar region without neurogenic claudication: Secondary | ICD-10-CM | POA: Diagnosis not present

## 2022-11-10 DIAGNOSIS — M5416 Radiculopathy, lumbar region: Secondary | ICD-10-CM | POA: Insufficient documentation

## 2022-11-10 DIAGNOSIS — M79605 Pain in left leg: Secondary | ICD-10-CM | POA: Insufficient documentation

## 2022-11-10 DIAGNOSIS — M545 Low back pain, unspecified: Secondary | ICD-10-CM | POA: Diagnosis not present

## 2022-11-10 MED ORDER — ROPIVACAINE HCL 2 MG/ML IJ SOLN
INTRAMUSCULAR | Status: AC
  Filled 2022-11-10: qty 20

## 2022-11-10 MED ORDER — SODIUM CHLORIDE (PF) 0.9 % IJ SOLN
INTRAMUSCULAR | Status: AC
  Filled 2022-11-10: qty 10

## 2022-11-10 MED ORDER — SODIUM CHLORIDE 0.9% FLUSH
1.0000 mL | Freq: Once | INTRAVENOUS | Status: AC
  Administered 2022-11-10: 1 mL

## 2022-11-10 MED ORDER — LIDOCAINE HCL 2 % IJ SOLN
20.0000 mL | Freq: Once | INTRAMUSCULAR | Status: AC
  Administered 2022-11-10: 200 mg

## 2022-11-10 MED ORDER — ROPIVACAINE HCL 2 MG/ML IJ SOLN
1.0000 mL | Freq: Once | INTRAMUSCULAR | Status: AC
  Administered 2022-11-10: 1 mL via EPIDURAL

## 2022-11-10 MED ORDER — DEXAMETHASONE SODIUM PHOSPHATE 10 MG/ML IJ SOLN
10.0000 mg | Freq: Once | INTRAMUSCULAR | Status: AC
  Administered 2022-11-10: 10 mg

## 2022-11-10 MED ORDER — DEXAMETHASONE SODIUM PHOSPHATE 10 MG/ML IJ SOLN
INTRAMUSCULAR | Status: AC
  Filled 2022-11-10: qty 1

## 2022-11-10 MED ORDER — LIDOCAINE HCL (PF) 2 % IJ SOLN
INTRAMUSCULAR | Status: AC
  Filled 2022-11-10: qty 10

## 2022-11-10 MED ORDER — IOHEXOL 180 MG/ML  SOLN
INTRAMUSCULAR | Status: AC
  Filled 2022-11-10: qty 20

## 2022-11-10 MED ORDER — IOHEXOL 180 MG/ML  SOLN
10.0000 mL | Freq: Once | INTRAMUSCULAR | Status: AC
  Administered 2022-11-10: 10 mL via EPIDURAL

## 2022-11-10 NOTE — Progress Notes (Signed)
PROVIDER NOTE: Interpretation of information contained herein should be left to medically-trained personnel. Specific patient instructions are provided elsewhere under "Patient Instructions" section of medical record. This document was created in part using STT-dictation technology, any transcriptional errors that may result from this process are unintentional.  Patient: Barbara Faulkner Type: Established DOB: 1966/03/04 MRN: [id number] PCP: Burnard Hawthorne, FNP  Service: Procedure DOS: 11/10/2022 Setting: Ambulatory Location: Ambulatory outpatient facility Delivery: Face-to-face Provider: Gillis Santa, MD Specialty: Interventional Pain Management Specialty designation: 09 Location: Outpatient facility Ref. Prov.: Gillis Santa, MD       Interventional Therapy   Procedure: Lumbar trans-foraminal epidural steroid injection (L-TFESI) #1  Laterality: Left (-LT)  Level: L4 nerve root(s) Imaging: Fluoroscopy-guided         Anesthesia: Local anesthesia (1-2% Lidocaine) DOS: 11/10/2022  Performed by: Gillis Santa, MD  Purpose: Diagnostic/Therapeutic Indications: Lumbar radicular pain severe enough to impact quality of life or function. 1. Lumbar radicular pain   2. Lumbar pain with radiation down left leg   3. 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 Neural foraminal stenosis of lumbar spine    NAS-11 Pain score:   Pre-procedure: 6 /10   Post-procedure: 0-No pain/10     Position / Prep / Materials:  Position: Prone  Prep solution: DuraPrep (Iodine Povacrylex [0.7% available iodine] and Isopropyl Alcohol, 74% w/w) Prep Area: Entire Posterior Lumbosacral Area.  From the lower tip of the scapula down to the tailbone and from flank to flank. Materials:  Tray: Block Needle(s):  Type: Spinal  Gauge (G): 22  Length: 3.5-in  Qty: 1      Pre-op H&P Assessment:  Barbara Faulkner is a 57 y.o. (year old), female patient, seen today for interventional treatment. She  has a  past surgical history that includes Cholecystectomy (2009); Tubal ligation (1997); Laparoscopic gastric banding (2008); Gastric bypass; Breast biopsy (Right, 01/28/2018); Colonoscopy with propofol (N/A, 07/19/2018); Esophagogastroduodenoscopy (egd) with propofol (N/A, 07/19/2018); Breast reduction surgery (Bilateral, 04/03/2020); Colonoscopy with propofol (N/A, 09/17/2021); Colonoscopy with propofol (N/A, 09/18/2021); and Reduction mammaplasty. Barbara Faulkner has a current medication list which includes the following prescription(s): calcitriol, cholecalciferol, cyclobenzaprine, duloxetine, gabapentin, lamotrigine, quetiapine, tramadol, vitamin d, pantoprazole, and prednisone, and the following Facility-Administered Medications: lidocaine (pf). Her primarily concern today is the Back Pain (lower)  Initial Vital Signs:  Pulse/HCG Rate: 95ECG Heart Rate: 86 Temp: (!) 97.2 F (36.2 C) Resp: 17 BP: (!) 128/95 SpO2: 99 %  BMI: Estimated body mass index is 27.1 kg/m as calculated from the following:   Height as of this encounter: '5\' 3"'$  (1.6 m).   Weight as of this encounter: 153 lb (69.4 kg).  Risk Assessment: Allergies: Reviewed. She is allergic to shellfish allergy and nsaids.  Allergy Precautions: None required Coagulopathies: Reviewed. None identified.  Blood-thinner therapy: None at this time Active Infection(s): Reviewed. None identified. Barbara Faulkner is afebrile  Site Confirmation: Barbara Faulkner was asked to confirm the procedure and laterality before marking the site Procedure checklist: Completed Consent: Before the procedure and under the influence of no sedative(s), amnesic(s), or anxiolytics, the patient was informed of the treatment options, risks and possible complications. To fulfill our ethical and legal obligations, as recommended by the American Medical Association's Code of Ethics, I have informed the patient of my clinical impression; the nature and purpose of the treatment or  procedure; the risks, benefits, and possible complications of the intervention; the alternatives, including doing nothing; the risk(s) and benefit(s) of the alternative treatment(s) or  procedure(s); and the risk(s) and benefit(s) of doing nothing. The patient was provided information about the general risks and possible complications associated with the procedure. These may include, but are not limited to: failure to achieve desired goals, infection, bleeding, organ or nerve damage, allergic reactions, paralysis, and death. In addition, the patient was informed of those risks and complications associated to Spine-related procedures, such as failure to decrease pain; infection (i.e.: Meningitis, epidural or intraspinal abscess); bleeding (i.e.: epidural hematoma, subarachnoid hemorrhage, or any other type of intraspinal or peri-dural bleeding); organ or nerve damage (i.e.: Any type of peripheral nerve, nerve root, or spinal cord injury) with subsequent damage to sensory, motor, and/or autonomic systems, resulting in permanent pain, numbness, and/or weakness of one or several areas of the body; allergic reactions; (i.e.: anaphylactic reaction); and/or death. Furthermore, the patient was informed of those risks and complications associated with the medications. These include, but are not limited to: allergic reactions (i.e.: anaphylactic or anaphylactoid reaction(s)); adrenal axis suppression; blood sugar elevation that in diabetics may result in ketoacidosis or comma; water retention that in patients with history of congestive heart failure may result in shortness of breath, pulmonary edema, and decompensation with resultant heart failure; weight gain; swelling or edema; medication-induced neural toxicity; particulate matter embolism and blood vessel occlusion with resultant organ, and/or nervous system infarction; and/or aseptic necrosis of one or more joints. Finally, the patient was informed that Medicine is  not an exact science; therefore, there is also the possibility of unforeseen or unpredictable risks and/or possible complications that may result in a catastrophic outcome. The patient indicated having understood very clearly. We have given the patient no guarantees and we have made no promises. Enough time was given to the patient to ask questions, all of which were answered to the patient's satisfaction. Ms. Damon has indicated that she wanted to continue with the procedure. Attestation: I, the ordering provider, attest that I have discussed with the patient the benefits, risks, side-effects, alternatives, likelihood of achieving goals, and potential problems during recovery for the procedure that I have provided informed consent. Date  Time: 11/10/2022 11:48 AM   Pre-Procedure Preparation:  Monitoring: As per clinic protocol. Respiration, ETCO2, SpO2, BP, heart rate and rhythm monitor placed and checked for adequate function Safety Precautions: Patient was assessed for positional comfort and pressure points before starting the procedure. Time-out: I initiated and conducted the "Time-out" before starting the procedure, as per protocol. The patient was asked to participate by confirming the accuracy of the "Time Out" information. Verification of the correct person, site, and procedure were performed and confirmed by me, the nursing staff, and the patient. "Time-out" conducted as per Joint Commission's Universal Protocol (UP.01.01.01). Time: 1227  Description/Narrative of Procedure:          Target: The 6 o'clock position under the pedicle, on the affected side. Region: Posterolateral Lumbosacral Approach: Posterior Percutaneous Paravertebral approach.  Rationale (medical necessity): procedure needed and proper for the diagnosis and/or treatment of the patient's medical symptoms and needs. Procedural Technique Safety Precautions: Aspiration looking for blood return was conducted prior to all  injections. At no point did we inject any substances, as a needle was being advanced. No attempts were made at seeking any paresthesias. Safe injection practices and needle disposal techniques used. Medications properly checked for expiration dates. SDV (single dose vial) medications used. Description of the Procedure: Protocol guidelines were followed. The patient was placed in position over the procedure table. The target area was identified and the  area prepped in the usual manner. Skin & deeper tissues infiltrated with local anesthetic. Appropriate amount of time allowed to pass for local anesthetics to take effect. The procedure needles were then advanced to the target area. Proper needle placement secured. Negative aspiration confirmed. Solution injected in intermittent fashion, asking for systemic symptoms every 0.5cc of injectate. The needles were then removed and the area cleansed, making sure to leave some of the prepping solution back to take advantage of its long term bactericidal properties.  2.5 cc solution made of 0.5 cc of preservative-free saline, 1.5 cc of 0.2% ropivacaine, 1 cc of Decadron 10 mg/cc.   Vitals:   11/10/22 1219 11/10/22 1225 11/10/22 1229 11/10/22 1233  BP: (!) 134/94 (!) 136/95 (!) 131/95 (!) 136/96  Pulse:      Resp: '12 15 14 20  '$ Temp:      TempSrc:      SpO2: 96% 95% 95% 96%  Weight:      Height:        Start Time: 1227 hrs. End Time: 1232 hrs.  Imaging Guidance (Spinal):          Type of Imaging Technique: Fluoroscopy Guidance (Spinal) Indication(s): Assistance in needle guidance and placement for procedures requiring needle placement in or near specific anatomical locations not easily accessible without such assistance. Exposure Time: Please see nurses notes. Contrast: Before injecting any contrast, we confirmed that the patient did not have an allergy to iodine, shellfish, or radiological contrast. Once satisfactory needle placement was completed at the  desired level, radiological contrast was injected. Contrast injected under live fluoroscopy. No contrast complications. See chart for type and volume of contrast used. Fluoroscopic Guidance: I was personally present during the use of fluoroscopy. "Tunnel Vision Technique" used to obtain the best possible view of the target area. Parallax error corrected before commencing the procedure. "Direction-depth-direction" technique used to introduce the needle under continuous pulsed fluoroscopy. Once target was reached, antero-posterior, oblique, and lateral fluoroscopic projection used confirm needle placement in all planes. Images permanently stored in EMR. Interpretation: I personally interpreted the imaging intraoperatively. Adequate needle placement confirmed in multiple planes. Appropriate spread of contrast into desired area was observed. No evidence of afferent or efferent intravascular uptake. No intrathecal or subarachnoid spread observed. Permanent images saved into the patient's record.  Post-operative Assessment:  Post-procedure Vital Signs:  Pulse/HCG Rate: 9594 Temp: (!) 97.2 F (36.2 C) Resp: 20 BP: (!) 136/96 SpO2: 96 %  EBL: None  Complications: No immediate post-treatment complications observed by team, or reported by patient.  Note: The patient tolerated the entire procedure well. A repeat set of vitals were taken after the procedure and the patient was kept under observation following institutional policy, for this type of procedure. Post-procedural neurological assessment was performed, showing return to baseline, prior to discharge. The patient was provided with post-procedure discharge instructions, including a section on how to identify potential problems. Should any problems arise concerning this procedure, the patient was given instructions to immediately contact us, at any time, without hesitation. In any case, we plan to contact the patient by telephone for a follow-up status  report regarding this interventional procedure.  Comments:  No additional relevant information.  Plan of Care (POC)  Orders:  Orders Placed This Encounter  Procedures   DG PAIN [facility]IN[facility] C-ARM 1-60 MIN NO REPORT    Intraoperative interpretation by procedural physician at [place].    Standing Status:   Standing    Number of Occurrences:  1    Order Specific Question:   Reason for exam:    Answer:   Assistance in needle guidance and placement for procedures requiring needle placement in or near specific anatomical locations not easily accessible without such assistance.   Chronic Opioid Analgesic:  tramadol 100 mg ER, continue oxycodone 5 mg daily for breakthrough pain.     Medications ordered for procedure: Meds ordered this encounter  Medications   iohexol (OMNIPAQUE) 180 MG/ML injection 10 mL    Must be Myelogram-compatible. If not available, you may substitute with a water-soluble, non-ionic, hypoallergenic, myelogram-compatible radiological contrast medium.   lidocaine (XYLOCAINE) 2 % (with pres) injection 400 mg   sodium chloride flush (NS) 0.9 % injection 1 mL   ropivacaine (PF) 2 mg/mL (0.2%) (NAROPIN) injection 1 mL   dexamethasone (DECADRON) injection 10 mg   Medications administered: We administered iohexol, lidocaine, sodium chloride flush, ropivacaine (PF) 2 mg/mL (0.2%), and dexamethasone.  See the medical record for exact dosing, route, and time of administration.  Follow-up plan:   Return in about 4 weeks (around 12/08/2022) for Keep scheduled appt. .       Recent Visits Date Type Provider Dept  10/21/22 Office Visit Gillis Santa, MD [facility]  10/16/22 Office Visit Gillis Santa, MD [facility]  10/01/22 Office Visit Gillis Santa, MD [facility]  Showing recent visits within past 90 days and meeting all other requirements Today's Visits Date Type Provider Dept  11/10/22 Procedure visit Gillis Santa, MD [facility]  Showing today's visits and meeting all other requirements Future Appointments Date Type Provider Dept  01/01/23 Appointment Gillis Santa, MD [facility]  Showing future appointments within next 90 days and meeting all other requirements  Disposition: Discharge home  Discharge (Date  Time): 11/10/2022; 1236 hrs.   Primary Care Physician: Burnard Hawthorne, FNP Location: [facility] Note by: Gillis Santa, MD (TTS technology used. I apologize for any typographical errors that were not detected and corrected.) Date: 11/10/2022; Time: 1:19 PM  Disclaimer:  Medicine is not an Chief Strategy Officer. The only guarantee in medicine is that nothing is guaranteed. It is important to note that the decision to proceed with this intervention was based on the information collected from the patient. The Data and conclusions were drawn from the patient's questionnaire, the interview, and the physical examination. Because the information was provided in large part by the patient, it cannot be guaranteed that it has not been purposely or unconsciously manipulated. Every effort has been made to obtain as much relevant data as possible for this evaluation. It is important to note that the conclusions that lead to this procedure are derived in large part from the available data. Always take into account that the treatment will also be dependent on availability of resources and existing treatment guidelines, considered by other Pain Management Practitioners as being common knowledge and practice, at the time of the intervention. For Medico-Legal purposes, it is also important to point out that variation in procedural techniques and pharmacological choices are the acceptable norm. The indications, contraindications, technique, and results of the above procedure should only be interpreted and judged by a Board-Certified Interventional Pain Specialist with extensive familiarity and  expertise in the same exact procedure and technique.

## 2022-11-10 NOTE — Progress Notes (Signed)
Safety precautions to be maintained throughout the outpatient stay will include: orient to surroundings, keep bed in low position, maintain call bell within reach at all times, provide assistance with transfer out of bed and ambulation.  

## 2022-11-10 NOTE — Patient Instructions (Signed)

## 2022-11-11 ENCOUNTER — Telehealth: Payer: Self-pay

## 2022-11-11 NOTE — Telephone Encounter (Signed)
Post procedure follow up.  Patient states she is doing great.  

## 2022-12-01 ENCOUNTER — Ambulatory Visit
Admission: RE | Admit: 2022-12-01 | Discharge: 2022-12-01 | Disposition: A | Discharge status: Home / Self Care | Payer: Federal, State, Local not specified - PPO | Source: Ambulatory Visit | Attending: Family | Admitting: Family

## 2022-12-01 ENCOUNTER — Encounter: Payer: Self-pay | Admitting: Family Medicine

## 2022-12-01 DIAGNOSIS — Z1231 Encounter for screening mammogram for malignant neoplasm of breast: Secondary | ICD-10-CM | POA: Diagnosis not present

## 2022-12-02 ENCOUNTER — Ambulatory Visit: Payer: Federal, State, Local not specified - PPO | Attending: Family Medicine

## 2022-12-02 DIAGNOSIS — M25552 Pain in left hip: Secondary | ICD-10-CM

## 2022-12-02 DIAGNOSIS — M6281 Muscle weakness (generalized): Secondary | ICD-10-CM | POA: Insufficient documentation

## 2022-12-02 NOTE — Telephone Encounter (Signed)
Please advise, sees lateef

## 2022-12-02 NOTE — Therapy (Unsigned)
OUTPATIENT PHYSICAL THERAPY HIP TREATMENT  Patient Name: Barbara Faulkner MRN: [id number] DOB:[birth year], 57 y.o., female Today's Date: 12/03/2022   PT End of Session - 12/02/22 1310     Visit Number 18    Number of Visits 49    Date for PT Re-Evaluation 12/30/22    Authorization Type eval: 07/08/22    Authorization Time Period [employer] 2024 VL:75 combined visits per year    Authorization - Visit Number 7    Authorization - Number of Visits 45    PT Start Time [id number]    PT Stop Time 1400    PT Time Calculation (min) 45 min    Activity Tolerance Patient tolerated treatment well    Behavior During Therapy [facility] for tasks assessed/performed            Past Medical History:  Diagnosis Date   Allergy    Anxiety    Chronic kidney disease    Chronic pain    Chronic sinusitis    COVID-19    09/14/20, 06/13/21   Depression    Heart murmur    Hiatal hernia    HPV (human papilloma virus) anogenital infection    Macromastia    Migraine    Psoriatic arthritis    Shingles    UTI (urinary tract infection)    ecoli 09/2021   Past Surgical History:  Procedure Laterality Date   BREAST BIOPSY Right 01/28/2018   US guided biopsy - heart shaped   BREAST REDUCTION SURGERY Bilateral 04/03/2020   Procedure: MAMMARY REDUCTION  (BREAST);  Surgeon: Contogiannis, Audrea Muscat, MD;  Location: [facility];  Service: Plastics;  Laterality: Bilateral;  HAVE LIPOSUCTION MACHINE AVAILABLE   CHOLECYSTECTOMY  2009   COLONOSCOPY WITH PROPOFOL N/A 07/19/2018   Procedure: COLONOSCOPY WITH PROPOFOL;  Surgeon: Lin Landsman, MD;  Location: [facility] ENDOSCOPY;  Service: Gastroenterology;  Laterality: N/A;   COLONOSCOPY WITH PROPOFOL N/A 09/17/2021   Procedure: COLONOSCOPY WITH PROPOFOL;  Surgeon: Lin Landsman, MD;  Location: [facility] ENDOSCOPY;  Service: Gastroenterology;  Laterality: N/A;   COLONOSCOPY WITH PROPOFOL N/A 09/18/2021   Procedure: COLONOSCOPY WITH PROPOFOL;  Surgeon: Lin Landsman, MD;  Location: [facility] ENDOSCOPY;  Service: Gastroenterology;  Laterality: N/A;   ESOPHAGOGASTRODUODENOSCOPY (EGD) WITH PROPOFOL N/A 07/19/2018   Procedure: ESOPHAGOGASTRODUODENOSCOPY (EGD) WITH PROPOFOL;  Surgeon: Lin Landsman, MD;  Location: [facility] ENDOSCOPY;  Service: Gastroenterology;  Laterality: N/A;   GASTRIC BYPASS     2015; duodenal switch    LAPAROSCOPIC GASTRIC BANDING  2008   removed 2009   [place]   Patient Active Problem List   Diagnosis Date Noted   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) 10/21/2022   Lumbar radicular pain 10/01/2022   Fibromyalgia 10/01/2022   Normocytic anemia 06/13/2022   Elevated alkaline phosphatase level 05/28/2022   Chills 05/26/2022   Chronic left SI joint pain 11/13/2021   Piriformis syndrome, left 11/13/2021   Greater 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 11/13/2021   Primary osteoarthritis of right hip 09/16/2021   Plantar fasciitis 08/22/2021   Carpal tunnel syndrome on right 08/22/2021   Primary osteoarthritis of left hip 08/15/2021   Hyperphosphatemia 08/01/2021   Anemia in chronic kidney disease 04/18/2021   Chronic kidney disease, stage 3a 04/18/2021   Hyperparathyroidism due to renal insufficiency 04/18/2021   Hypertension 04/18/2021   Hiatal hernia 11/02/2020   History of migraine 11/02/2020   Gastroesophageal reflux disease 11/02/2020  Brain fog 11/02/2020   Depression, recurrent 03/18/2020   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 09/08/2018   Arthropathy of right shoulder 09/08/2018   Controlled substance agreement signed 09/08/2018   Myofascial pain syndrome 08/24/2018   Routine physical examination 08/24/2018   Iron deficiency anemia 08/24/2018   Elevated liver enzymes 07/19/2018   Vitamin D deficiency 07/19/2018   Colon cancer screening    Abdominal pain, epigastric 07/08/2018   Right shoulder pain 06/15/2018   Hemorrhoids 05/01/2017   Anxiety and  depression 11/24/2016   Lumbar pain with radiation down left leg 09/11/2016   Large breasts 09/11/2016   Parotiditis 05/01/2016   Abnormal laboratory test result 03/26/2016   Proteinuria 03/26/2016   Right hip pain 04/18/2015   Sinusitis 01/08/2014   Psoriatic arthritis 09/13/2013   DUB (dysfunctional uterine bleeding) 10/27/2012   Gastritis due to nonsteroidal anti-inflammatory drug 12/12/2011   Anaphylactic reaction due to shellfish 09/27/2010   THYROMEGALY 09/03/2010   ALLERGIC RHINITIS 03/15/2010   MIGRAINE Elio Forget W/O INTRACT W/O STATUS MIGRNOSUS 05/17/2008   PCP: [name] FNP  REFERRING PROVIDER: Rosette Reveal MD  REFERRING DIAG: [phone] (ICD-10-CM) - Primary osteoarthritis of left hip, M53.3,G89.29 (ICD-10-CM) - Chronic left SI joint pain, G57.02 (ICD-10-CM) - Piriformis syndrome, left, M25.552 (ICD-10-CM) - Greater 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  Rationale for Evaluation and Treatment: Rehabilitation  THERAPY DIAG: Pain in left hip  Muscle weakness (generalized)  ONSET DATE: 09/02/2015 (approximate)  FOLLOW-UP APPT SCHEDULED WITH REFERRING PROVIDER: Yes   FROM INITIAL EVALUATION SUBJECTIVE:                                                                                                                                                                                         SUBJECTIVE STATEMENT: L hip pain  PERTINENT HISTORY:  Pt reports a diagnosis of Lyme Disease in 2017 with persistent widespread chronic joint pain after treatment.  Of note she has severe L hip pain as well as pain in her L knee and right shoulder. Her L hip pain has been worsening recently. She denies any pain in her R hip or L shoulder. She was previously receiving L hip injections from Dr. Mack Guise however she reports that he refused to continue with further injections so she was referred by pain management to Dr. Zigmund Daniel. She saw Dr. Zigmund Daniel and pt received a L hip  intraarticular injection as well as an injection in her L SIJ and L greater trochanter. The injections have helped with her pain. She was also referred to an orthopedic surgeon to discuss THR and referred for physical therapy. Pt expresses that she really enjoys exercising but  is very limited by her pain.   PAIN:  Low today, posterior thigh and later hip  PRECAUTIONS: None  WEIGHT BEARING RESTRICTIONS: No  FALLS: Has patient fallen in last 6 months? No  OBJECTIVE:   LE MMT: MMT (out of 5) Right   Left   Right 1/25 Left 1/25   Hip flexion 5 4+* 5/5 5/5  Hip extension (prone)     5/5 (5/5)  Hip abduction 4+ 4* 4+/5 4/5  Hip horizonal ABDCT/ADD   5/5 5/5  Hip adduction 4+ 4*    Hip internal rotation 5 5 5/5 4+/5  Hip external rotation 5 4* 5/5 4+/5  Knee flexion 4+ 4+* 5/5 4+/5  Knee extension 5 5 5/5 5/5  Ankle dorsiflexion 5 5 5/5 5/5    TODAY'S TREATMENT   SUBJECTIVE: Pt reports continued pain in her L hip. The low back injection was very helpful for her back pain but she is waiting to hear back from Dr. Zigmund Daniel regarding when she can schedule her next hip injection.  No specific questions currently.   PAIN: 5/10 L hip pain, denies back pain;   Ther-ex  NuStep L1-2 x 5 minutes for warm-up during interval history; Hooklying clams with manual resistance 2 x 10; Adductor squeezes with manual resistance 2 x 10; Hooklying bridges 2 x 10; Hooklying alternating marches 2 x 10 BLE; Hooklying hip fall-outs 2 x 10 BLE; Supine LLE heel slide with manually resisted extension with pt pushing into therapist 2 x 10; Supine LLE straight leg hip abduction/adduction with manual resistance from therapist 2 x 10 each;   Manual Therapy  Supine L hip AP mobilizations at neutral, grade I, 30s/bout x 3 bouts; Supine L hip long axis gentle distraction with belt assist, 30s/bout x 3 bouts; Supine L hip inferior mobilizations with belt assist and hip flexed to 90 degrees 30s/bout x 3  bouts; Supine L hip gentle medial to lateral mobilizations with hip flexed to 45 degrees, belt assist, 30s/bout x 3 bouts; Supine L hip gentle medial to lateral/inferior mobilizations with hip flexed to 45 degrees and slightly externally rotated within comfortable range, belt assist, 30s/bout x 3 bouts;   Not performed: R sidelying L hip straight leg abduction 2 x 10; R sidelying L hip reverse clam 2 x 10; Total Gym (TG) Level 22 (L22) squats 6# dumbbells (DB) 2 x 20; TG L22 bilateral heel raises with 2, 6# DB 2 x 20; Resisted side stepping with blue tband around ankles 30' x 4 lengths with rest after first 2; Bridges with blue tband 3s hold x 10; Bridges with TB while marching 2 x 10;   PATIENT EDUCATION:  Education details: Pt educated throughout session about proper posture and technique with exercises. Improved exercise technique, movement at target joints, use of target muscles after min to mod verbal, visual, tactile cues Person educated: Patient Education method: Explanation  Education comprehension: verbalized understanding   HOME EXERCISE PROGRAM:  Access Code: [email or web address] URL: [email or web address] Date: 07/17/2022 Prepared by: Roxana Hires  Exercises - Hooklying Lumbar Rotation  - 1 x daily - 7 x weekly - 2 sets - 10 reps - 3s hold - Supine March  - 1 x daily - 7 x weekly - 2 sets - 10 reps - 3s hold - Supine Bridge  - 1 x daily - 7 x weekly - 2 sets - 10 reps - 3s hold   ASSESSMENT:  CLINICAL IMPRESSION: Pt reporting improvement in her back pain after her ESI but  her L hip remains very painful. Performed hip mobilizations during session today to help with pain management and pt reports improvement in her pain at the end of the session. Also performed strengthening which remains somewhat limited by pain. Pt would like a recommendation for [facility] surgeon and provided name of Dr. Harlow Mares at [facility] in [place]. Plan to progress strengthening at future  sessions including additional CKC exercises. She will benefit from PT services to address deficits in strength, range of motion, and pain in order to improve pain with household and work responsibilities.   OBJECTIVE IMPAIRMENTS: Abnormal gait, decreased ROM, decreased strength, and pain.   ACTIVITY LIMITATIONS: lifting, bending, standing, and squatting  PARTICIPATION LIMITATIONS: cleaning, shopping, community activity, and occupation  PERSONAL FACTORS: Past/current experiences, Time since onset of injury/illness/exacerbation, and 3+ comorbidities: depression, anxiety, migraines, chronic pain, and psoriatic arthritis  are also affecting patient's functional outcome.   REHAB POTENTIAL: Fair    CLINICAL DECISION MAKING: Unstable/unpredictable  EVALUATION COMPLEXITY: High   GOALS: Goals reviewed with patient? No  SHORT TERM GOALS: Target date: 12/02/2022   Pt will be independent with HEP in order to improve strength and range of motion as well as decrease hip pain to improve function at home and work. Baseline:  Goal status: INITIAL   LONG TERM GOALS: Target date: 12/30/2022   Pt will increase FOTO to at least 66 to demonstrate significant improvement in function at home and work related to L hip pain  Baseline: 07/08/22: 52; 11/05/22: 54; Goal status: PARTIALLY MET;  2.  Pt will decrease worst hip pain by at least 2 points on the NPRS in order to demonstrate clinically significant reduction in hip pain. Baseline: 07/08/22: worst: 10/10; 11/04/22: 5/10; Goal status: ACHIEVED;  3.  Pt will report at least 25% improvement in L hip symptoms in order to demonstrate clinically significant reduction in hip pain/disability so she can return to increased frequency of exercise with less pain       Baseline: 09/25/22: achieved almost completely in response to injections, not as much due to PT interventions Goal status: ACHIEVED  4.  Pt will increase pain-free strength of L hip abduction/adduction  by at least 1/2 MMT grade in order to demonstrate improvement in strength and function  Baseline: 07/08/22: 4/5 both with pain; 09/25/22: ACHIEVED Goal status: ACHIEVED   PLAN: PT FREQUENCY: 1-2x/week  PT DURATION: 8 weeks  PLANNED INTERVENTIONS: Therapeutic exercises, Therapeutic activity, Neuromuscular re-education, Balance training, Gait training, Patient/Family education, Self Care, Joint mobilization, Joint manipulation, Vestibular training, Canalith repositioning, Orthotic/Fit training, DME instructions, Dry Needling, Electrical stimulation, Spinal manipulation, Spinal mobilization, Cryotherapy, Moist heat, Taping, Traction, Ultrasound, Ionotophoresis 4mg /ml Dexamethasone, Manual therapy, and Re-evaluation.  PLAN FOR NEXT SESSION: continue LE/hip strengthening  Phillips Grout PT, DPT, GCS  10:49 AM,12/03/22

## 2022-12-04 DIAGNOSIS — F419 Anxiety disorder, unspecified: Secondary | ICD-10-CM | POA: Diagnosis not present

## 2022-12-04 DIAGNOSIS — F4312 Post-traumatic stress disorder, chronic: Secondary | ICD-10-CM | POA: Diagnosis not present

## 2022-12-04 DIAGNOSIS — F39 Unspecified mood [affective] disorder: Secondary | ICD-10-CM | POA: Diagnosis not present

## 2022-12-08 DIAGNOSIS — M1612 Unilateral primary osteoarthritis, left hip: Secondary | ICD-10-CM | POA: Diagnosis not present

## 2022-12-09 ENCOUNTER — Ambulatory Visit: Payer: Federal, State, Local not specified - PPO

## 2022-12-09 DIAGNOSIS — M6281 Muscle weakness (generalized): Secondary | ICD-10-CM | POA: Diagnosis not present

## 2022-12-09 DIAGNOSIS — M25552 Pain in left hip: Secondary | ICD-10-CM | POA: Diagnosis not present

## 2022-12-09 NOTE — Therapy (Signed)
OUTPATIENT PHYSICAL THERAPY HIP TREATMENT  Patient Name: Barbara Faulkner MRN: [id number] DOB:06-12-1966, 57 y.o., female Today's Date: 12/09/2022   PT End of Session - 12/09/22 1309     Visit Number 19    Number of Visits 49    Date for PT Re-Evaluation 12/30/22    Authorization Type eval: 07/08/22    Authorization Time Period [employer] 2024 VL:75 combined visits per year    Authorization - Visit Number 8    Authorization - Number of Visits 75    PT Start Time 1315    PT Stop Time 1400    PT Time Calculation (min) 45 min    Activity Tolerance Patient tolerated treatment well    Behavior During Therapy [facility] for tasks assessed/performed            Past Medical History:  Diagnosis Date   Allergy    Anxiety    Chronic kidney disease    Chronic pain    Chronic sinusitis    COVID-19    09/14/20, 06/13/21   Depression    Heart murmur    Hiatal hernia    HPV (human papilloma virus) anogenital infection    Macromastia    Migraine    Psoriatic arthritis    Shingles    UTI (urinary tract infection)    ecoli 09/2021   Past Surgical History:  Procedure Laterality Date   BREAST BIOPSY Right 01/28/2018   US guided biopsy - heart shaped   BREAST REDUCTION SURGERY Bilateral 04/03/2020   Procedure: MAMMARY REDUCTION  (BREAST);  Surgeon: Contogiannis, Chales Abrahams, MD;  Location: [facility];  Service: Plastics;  Laterality: Bilateral;  HAVE LIPOSUCTION MACHINE AVAILABLE   CHOLECYSTECTOMY  2009   COLONOSCOPY WITH PROPOFOL N/A 07/19/2018   Procedure: COLONOSCOPY WITH PROPOFOL;  Surgeon: Toney Reil, MD;  Location: [facility] ENDOSCOPY;  Service: Gastroenterology;  Laterality: N/A;   COLONOSCOPY WITH PROPOFOL N/A 09/17/2021   Procedure: COLONOSCOPY WITH PROPOFOL;  Surgeon: Toney Reil, MD;  Location: [facility] ENDOSCOPY;  Service: Gastroenterology;  Laterality: N/A;   COLONOSCOPY WITH PROPOFOL N/A 09/18/2021   Procedure: COLONOSCOPY WITH PROPOFOL;  Surgeon: Toney Reil, MD;  Location: [facility] ENDOSCOPY;  Service: Gastroenterology;  Laterality: N/A;   ESOPHAGOGASTRODUODENOSCOPY (EGD) WITH PROPOFOL N/A 07/19/2018   Procedure: ESOPHAGOGASTRODUODENOSCOPY (EGD) WITH PROPOFOL;  Surgeon: Toney Reil, MD;  Location: [facility] ENDOSCOPY;  Service: Gastroenterology;  Laterality: N/A;   GASTRIC BYPASS     2015; duodenal switch    LAPAROSCOPIC GASTRIC BANDING  2008   removed 2009   REDUCTION MAMMAPLASTY     TUBAL LIGATION  1997   Patient Active Problem List   Diagnosis Date Noted   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) 10/21/2022   Lumbar radicular pain 10/01/2022   Fibromyalgia 10/01/2022   Normocytic anemia 06/13/2022   Elevated alkaline phosphatase level 05/28/2022   Chills 05/26/2022   Chronic left SI joint pain 11/13/2021   Piriformis syndrome, left 11/13/2021   Greater 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 11/13/2021   Primary osteoarthritis of right hip 09/16/2021   Plantar fasciitis 08/22/2021   Carpal tunnel syndrome on right 08/22/2021   Primary osteoarthritis of left hip 08/15/2021   Hyperphosphatemia 08/01/2021   Anemia in chronic kidney disease 04/18/2021   Chronic kidney disease, stage 3a 04/18/2021   Hyperparathyroidism due to renal insufficiency 04/18/2021   Hypertension 04/18/2021   Hiatal hernia 11/02/2020   History of migraine 11/02/2020   Gastroesophageal reflux disease 11/02/2020  Brain fog 11/02/2020   Depression, recurrent 03/18/2020   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 09/08/2018   Arthropathy of right shoulder 09/08/2018   Controlled substance agreement signed 09/08/2018   Myofascial pain syndrome 08/24/2018   Routine physical examination 08/24/2018   Iron deficiency anemia 08/24/2018   Elevated liver enzymes 07/19/2018   Vitamin D deficiency 07/19/2018   Colon cancer screening    Abdominal pain, epigastric 07/08/2018   Right shoulder pain 06/15/2018   Hemorrhoids 05/01/2017   Anxiety and  depression 11/24/2016   Lumbar pain with radiation down left leg 09/11/2016   Large breasts 09/11/2016   Parotiditis 05/01/2016   Abnormal laboratory test result 03/26/2016   Proteinuria 03/26/2016   Right hip pain 04/18/2015   Sinusitis 01/08/2014   Psoriatic arthritis 09/13/2013   DUB (dysfunctional uterine bleeding) 10/27/2012   Gastritis due to nonsteroidal anti-inflammatory drug 12/12/2011   Anaphylactic reaction due to shellfish 09/27/2010   THYROMEGALY 09/03/2010   ALLERGIC RHINITIS 03/15/2010   MIGRAINE Elio Forget W/O INTRACT W/O STATUS MIGRNOSUS 05/17/2008   PCP: [name] FNP  REFERRING PROVIDER: Rosette Reveal MD  REFERRING DIAG: [phone] (ICD-10-CM) - Primary osteoarthritis of left hip, M53.3,G89.29 (ICD-10-CM) - Chronic left SI joint pain, G57.02 (ICD-10-CM) - Piriformis syndrome, left, M25.552 (ICD-10-CM) - Greater 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  Rationale for Evaluation and Treatment: Rehabilitation  THERAPY DIAG: Pain in left hip  Muscle weakness (generalized)  ONSET DATE: 09/02/2015 (approximate)  FOLLOW-UP APPT SCHEDULED WITH REFERRING PROVIDER: Yes   FROM INITIAL EVALUATION SUBJECTIVE:                                                                                                                                                                                         SUBJECTIVE STATEMENT: L hip pain  PERTINENT HISTORY:  Pt reports a diagnosis of Lyme Disease in 2017 with persistent widespread chronic joint pain after treatment.  Of note she has severe L hip pain as well as pain in her L knee and right shoulder. Her L hip pain has been worsening recently. She denies any pain in her R hip or L shoulder. She was previously receiving L hip injections from Dr. Mack Guise however she reports that he refused to continue with further injections so she was referred by pain management to Dr. Zigmund Daniel. She saw Dr. Zigmund Daniel and pt received a L hip  intraarticular injection as well as an injection in her L SIJ and L greater trochanter. The injections have helped with her pain. She was also referred to an orthopedic surgeon to discuss THR and referred for physical therapy. Pt expresses that she really enjoys exercising but  is very limited by her pain.   PAIN:  Low today, posterior thigh and later hip  PRECAUTIONS: None  WEIGHT BEARING RESTRICTIONS: No  FALLS: Has patient fallen in last 6 months? No  OBJECTIVE:   LE MMT: MMT (out of 5) Right   Left   Right 1/25 Left 1/25   Hip flexion 5 4+* 5/5 5/5  Hip extension (prone)     5/5 (5/5)  Hip abduction 4+ 4* 4+/5 4/5  Hip horizonal ABDCT/ADD   5/5 5/5  Hip adduction 4+ 4*    Hip internal rotation 5 5 5/5 4+/5  Hip external rotation 5 4* 5/5 4+/5  Knee flexion 4+ 4+* 5/5 4+/5  Knee extension 5 5 5/5 5/5  Ankle dorsiflexion 5 5 5/5 5/5    TODAY'S TREATMENT   SUBJECTIVE: Pt consulted with orthopedic surgery and she is now scheduled for an anterior approach THA on May 29th. She reports continued pain in her L hip and is not able to get a hip injection because she is scheduled for surgery. Her low back pain continues to do well. No specific questions currently.   PAIN: L hip pain   Ther-ex  NuStep L1-4 x 10 minutes for warm-up and BLE strengtheing during interval history with therapist adjusting resistance; Total Gym (TG) Level 22 (L22) squats 6# dumbbells (DB) 2 x 20; TG L22 bilateral heel raises with 2, 6# DB 2 x 20; Forward 12" step-ups leading with LLE x 10; L lateral step-ups x 10; 12" R lateral heel taps x 10;   Manual Therapy  Supine L hip long axis gentle distraction with belt assist, 30s/bout x 3 bouts; Supine L hip inferior mobilizations with belt assist and hip flexed to 90 degrees 30s/bout x 3 bouts; Supine L hip gentle medial to lateral mobilizations with hip flexed to 45 degrees, belt assist, 30s/bout x 3 bouts; Supine L hip gentle medial to  lateral/inferior mobilizations with hip flexed to 45 degrees and slightly externally rotated within comfortable range, belt assist, 30s/bout x 3 bouts;   Not performed: R sidelying L hip straight leg abduction 2 x 10; R sidelying L hip reverse clam 2 x 10; Resisted side stepping with blue tband around ankles 30' x 4 lengths with rest after first 2; Bridges with blue tband 3s hold x 10; Bridges with TB while marching 2 x 10; Hooklying clams with manual resistance 2 x 10; Adductor squeezes with manual resistance 2 x 10; Hooklying bridges 2 x 10; Hooklying alternating marches 2 x 10 BLE; Hooklying hip fall-outs 2 x 10 BLE; Supine LLE heel slide with manually resisted extension with pt pushing into therapist 2 x 10; Supine LLE straight leg hip abduction/adduction with manual resistance from therapist 2 x 10 each;   PATIENT EDUCATION:  Education details: Pt educated throughout session about proper posture and technique with exercises. Improved exercise technique, movement at target joints, use of target muscles after min to mod verbal, visual, tactile cues Person educated: Patient Education method: Explanation  Education comprehension: verbalized understanding   HOME EXERCISE PROGRAM:  Access Code: [email or web address] URL: [email or web address] Date: 07/17/2022 Prepared by: Ria CommentJason Annisa Mazzarella  Exercises - Hooklying Lumbar Rotation  - 1 x daily - 7 x weekly - 2 sets - 10 reps - 3s hold - Supine March  - 1 x daily - 7 x weekly - 2 sets - 10 reps - 3s hold - Supine Bridge  - 1 x daily - 7 x weekly - 2 sets - 10 reps -  3s hold   ASSESSMENT:  CLINICAL IMPRESSION: Pt reporting continued improvement in her back pain after her ESI. She is scheduled for L hip anterior approach THA at the end of May. Plan to continue with strengthening leading up to her surgery in order to maintain as much strength as possible. Performed hip mobilizations during session today to help with pain management and  pt reports improvement in her pain at the end of the session. Also performed strengthening which remains somewhat limited by pain. Plan to progress strengthening at future sessions including additional CKC exercises. She will benefit from PT services to address deficits in strength, range of motion, and pain in order to improve pain with household and work responsibilities.   OBJECTIVE IMPAIRMENTS: Abnormal gait, decreased ROM, decreased strength, and pain.   ACTIVITY LIMITATIONS: lifting, bending, standing, and squatting  PARTICIPATION LIMITATIONS: cleaning, shopping, community activity, and occupation  PERSONAL FACTORS: Past/current experiences, Time since onset of injury/illness/exacerbation, and 3+ comorbidities: depression, anxiety, migraines, chronic pain, and psoriatic arthritis  are also affecting patient's functional outcome.   REHAB POTENTIAL: Fair    CLINICAL DECISION MAKING: Unstable/unpredictable  EVALUATION COMPLEXITY: High   GOALS: Goals reviewed with patient? No  SHORT TERM GOALS: Target date: 12/02/2022   Pt will be independent with HEP in order to improve strength and range of motion as well as decrease hip pain to improve function at home and work. Baseline:  Goal status: INITIAL   LONG TERM GOALS: Target date: 12/30/2022   Pt will increase FOTO to at least 66 to demonstrate significant improvement in function at home and work related to L hip pain  Baseline: 07/08/22: 52; 11/05/22: 54; Goal status: PARTIALLY MET;  2.  Pt will decrease worst hip pain by at least 2 points on the NPRS in order to demonstrate clinically significant reduction in hip pain. Baseline: 07/08/22: worst: 10/10; 11/04/22: 5/10; Goal status: ACHIEVED;  3.  Pt will report at least 25% improvement in L hip symptoms in order to demonstrate clinically significant reduction in hip pain/disability so she can return to increased frequency of exercise with less pain       Baseline: 09/25/22: achieved  almost completely in response to injections, not as much due to PT interventions Goal status: ACHIEVED  4.  Pt will increase pain-free strength of L hip abduction/adduction by at least 1/2 MMT grade in order to demonstrate improvement in strength and function  Baseline: 07/08/22: 4/5 both with pain; 09/25/22: ACHIEVED Goal status: ACHIEVED   PLAN: PT FREQUENCY: 1-2x/week  PT DURATION: 8 weeks  PLANNED INTERVENTIONS: Therapeutic exercises, Therapeutic activity, Neuromuscular re-education, Balance training, Gait training, Patient/Family education, Self Care, Joint mobilization, Joint manipulation, Vestibular training, Canalith repositioning, Orthotic/Fit training, DME instructions, Dry Needling, Electrical stimulation, Spinal manipulation, Spinal mobilization, Cryotherapy, Moist heat, Taping, Traction, Ultrasound, Ionotophoresis 4mg /ml Dexamethasone, Manual therapy, and Re-evaluation.  PLAN FOR NEXT SESSION: continue LE/hip strengthening  Sharalyn Ink Victorina Kable PT, DPT, GCS  9:26 PM,12/09/22

## 2022-12-15 NOTE — Therapy (Signed)
OUTPATIENT PHYSICAL THERAPY HIP TREATMENT/PROGRESS NOTE  Dates of reporting period  08/26/22   to   12/16/22   Patient Name: Barbara Faulkner MRN: [id number] DOB:12-27-1965, 57 y.o., female Today's Date: 12/17/2022   PT End of Session - 12/16/22 1313     Visit Number 20    Number of Visits 49    Date for PT Re-Evaluation 12/30/22    Authorization Type eval: 07/08/22    Authorization Time Period [employer] 2024 VL:75 combined visits per year    Authorization - Visit Number 9    Authorization - Number of Visits 75    PT Start Time 1315    PT Stop Time 1400    PT Time Calculation (min) 45 min    Activity Tolerance Patient tolerated treatment well    Behavior During Therapy [facility] for tasks assessed/performed            Past Medical History:  Diagnosis Date   Allergy    Anxiety    Chronic kidney disease    Chronic pain    Chronic sinusitis    COVID-19    09/14/20, 06/13/21   Depression    Heart murmur    Hiatal hernia    HPV (human papilloma virus) anogenital infection    Macromastia    Migraine    Psoriatic arthritis    Shingles    UTI (urinary tract infection)    ecoli 09/2021   Past Surgical History:  Procedure Laterality Date   BREAST BIOPSY Right 01/28/2018   US guided biopsy - heart shaped   BREAST REDUCTION SURGERY Bilateral 04/03/2020   Procedure: MAMMARY REDUCTION  (BREAST);  Surgeon: Contogiannis, Chales Abrahams, MD;  Location: [facility];  Service: Plastics;  Laterality: Bilateral;  HAVE LIPOSUCTION MACHINE AVAILABLE   CHOLECYSTECTOMY  2009   COLONOSCOPY WITH PROPOFOL N/A 07/19/2018   Procedure: COLONOSCOPY WITH PROPOFOL;  Surgeon: Toney Reil, MD;  Location: [facility] ENDOSCOPY;  Service: Gastroenterology;  Laterality: N/A;   COLONOSCOPY WITH PROPOFOL N/A 09/17/2021   Procedure: COLONOSCOPY WITH PROPOFOL;  Surgeon: Toney Reil, MD;  Location: [facility] ENDOSCOPY;  Service: Gastroenterology;  Laterality: N/A;   COLONOSCOPY WITH PROPOFOL N/A 09/18/2021    Procedure: COLONOSCOPY WITH PROPOFOL;  Surgeon: Toney Reil, MD;  Location: [facility] ENDOSCOPY;  Service: Gastroenterology;  Laterality: N/A;   ESOPHAGOGASTRODUODENOSCOPY (EGD) WITH PROPOFOL N/A 07/19/2018   Procedure: ESOPHAGOGASTRODUODENOSCOPY (EGD) WITH PROPOFOL;  Surgeon: Toney Reil, MD;  Location: [facility] ENDOSCOPY;  Service: Gastroenterology;  Laterality: N/A;   GASTRIC BYPASS     2015; duodenal switch    LAPAROSCOPIC GASTRIC BANDING  2008   removed 2009   REDUCTION MAMMAPLASTY     TUBAL LIGATION  1997   Patient Active Problem List   Diagnosis Date Noted   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) 10/21/2022   Lumbar radicular pain 10/01/2022   Fibromyalgia 10/01/2022   Normocytic anemia 06/13/2022   Elevated alkaline phosphatase level 05/28/2022   Chills 05/26/2022   Chronic left SI joint pain 11/13/2021   Piriformis syndrome, left 11/13/2021   Greater 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 11/13/2021   Primary osteoarthritis of right hip 09/16/2021   Plantar fasciitis 08/22/2021   Carpal tunnel syndrome on right 08/22/2021   Primary osteoarthritis of left hip 08/15/2021   Hyperphosphatemia 08/01/2021   Anemia in chronic kidney disease 04/18/2021   Chronic kidney disease, stage 3a 04/18/2021   Hyperparathyroidism due to renal insufficiency 04/18/2021   Hypertension 04/18/2021  Hiatal hernia 11/02/2020   History of migraine 11/02/2020   Gastroesophageal reflux disease 11/02/2020   Brain fog 11/02/2020   Depression, recurrent 03/18/2020   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 09/08/2018   Arthropathy of right shoulder 09/08/2018   Controlled substance agreement signed 09/08/2018   Myofascial pain syndrome 08/24/2018   Routine physical examination 08/24/2018   Iron deficiency anemia 08/24/2018   Elevated liver enzymes 07/19/2018   Vitamin D deficiency 07/19/2018   Colon cancer screening    Abdominal pain, epigastric 07/08/2018   Right  shoulder pain 06/15/2018   Hemorrhoids 05/01/2017   Anxiety and depression 11/24/2016   Lumbar pain with radiation down left leg 09/11/2016   Large breasts 09/11/2016   Parotiditis 05/01/2016   Abnormal laboratory test result 03/26/2016   Proteinuria 03/26/2016   Right hip pain 04/18/2015   Sinusitis 01/08/2014   Psoriatic arthritis 09/13/2013   DUB (dysfunctional uterine bleeding) 10/27/2012   Gastritis due to nonsteroidal anti-inflammatory drug 12/12/2011   Anaphylactic reaction due to shellfish 09/27/2010   THYROMEGALY 09/03/2010   ALLERGIC RHINITIS 03/15/2010   MIGRAINE [name] 05/17/2008   PCP: Rennie Plowman FNP  REFERRING PROVIDER: [name] MD  REFERRING DIAG: [phone] (ICD-10-CM) - Primary osteoarthritis of left hip, M53.3,G89.29 (ICD-10-CM) - Chronic left SI joint pain, G57.02 (ICD-10-CM) - Piriformis syndrome, left, M25.552 (ICD-10-CM) - Greater 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  Rationale for Evaluation and Treatment: Rehabilitation  THERAPY DIAG: Pain in left hip  Muscle weakness (generalized)  ONSET DATE: 09/02/2015 (approximate)  FOLLOW-UP APPT SCHEDULED WITH REFERRING PROVIDER: Yes   FROM INITIAL EVALUATION SUBJECTIVE:                                                                                                                                                                                         SUBJECTIVE STATEMENT: L hip pain  PERTINENT HISTORY:  Pt reports a diagnosis of Lyme Disease in 2017 with persistent widespread chronic joint pain after treatment.  Of note she has severe L hip pain as well as pain in her L knee and right shoulder. Her L hip pain has been worsening recently. She denies any pain in her R hip or L shoulder. She was previously receiving L hip injections from Dr. Martha Clan however she reports that he refused to continue with further injections so she was referred by pain management to  Dr. Ashley Royalty. She saw Dr. Ashley Royalty and pt received a L hip intraarticular injection as well as an injection in her L SIJ and L greater trochanter. The injections have helped with her pain. She was also referred to an orthopedic  surgeon to discuss THR and referred for physical therapy. Pt expresses that she really enjoys exercising but is very limited by her pain.   PAIN:  Low today, posterior thigh and later hip  PRECAUTIONS: None  WEIGHT BEARING RESTRICTIONS: No  FALLS: Has patient fallen in last 6 months? No  OBJECTIVE:   LE MMT: MMT (out of 5) Right   Left   Right 1/25 Left 1/25   Hip flexion 5 4+* 5/5 5/5  Hip extension (prone)     5/5 (5/5)  Hip abduction 4+ 4* 4+/5 4/5  Hip horizonal ABDCT/ADD   5/5 5/5  Hip adduction 4+ 4*    Hip internal rotation 5 5 5/5 4+/5  Hip external rotation 5 4* 5/5 4+/5  Knee flexion 4+ 4+* 5/5 4+/5  Knee extension 5 5 5/5 5/5  Ankle dorsiflexion 5 5 5/5 5/5    TODAY'S TREATMENT   SUBJECTIVE: Pt reporting 5/10 L hip pain upon arrival today but her pain was 10/10 earlier this morning. She is eager for her THA given that she is unable to have a hip injection and the high level of pain she is reporting. Her low back pain continues to do well. No specific questions currently.   PAIN: 5/10 L hip pain   Ther-ex  NuStep L1-4 x 10 minutes for warm-up and BLE strengtheing during interval history with therapist adjusting resistance; Updated outcome measures: FOTO: Deferred Worst hip pain: 10/10; Hip abduction/adduction strength: L hip abduction: 4-/5, L hip adduction: 4/5;   Total Gym (TG) Level 22 (L22) squats 6# dumbbells (DB) 2 x 20; TG L22 bilateral heel raises with 2, 6# DB 2 x 20; Hooklying clams with manual resistance 2 x 10; Adductor squeezes with manual resistance 2 x 10; Hooklying bridges x 10; Hooklying alternating marches x 10 BLE; Supine LLE heel slide with manually resisted extension with pt pushing into therapist x  10;   Manual Therapy  Supine L hip long axis gentle distraction with belt assist, 30s/bout x 3 bouts; Supine L hip inferior mobilizations with belt assist and hip flexed to 90 degrees 30s/bout x 3 bouts; Supine L hip gentle medial to lateral mobilizations with hip flexed to 45 degrees, belt assist, 30s/bout x 3 bouts; Supine L hip gentle medial to lateral/inferior mobilizations with hip flexed to 45 degrees and slightly externally rotated within comfortable range, belt assist, 30s/bout x 3 bouts;   Not performed: R sidelying L hip straight leg abduction 2 x 10; R sidelying L hip reverse clam 2 x 10; Resisted side stepping with blue tband around ankles 30' x 4 lengths with rest after first 2; Supine LLE straight leg hip abduction/adduction with manual resistance from therapist 2 x 10 each; Forward 12" step-ups leading with LLE x 10; L lateral step-ups x 10; 12" R lateral heel taps x 10; Hooklying hip fall-outs 2 x 10 BLE;   PATIENT EDUCATION:  Education details: Pt educated throughout session about proper posture and technique with exercises. Improved exercise technique, movement at target joints, use of target muscles after min to mod verbal, visual, tactile cues Person educated: Patient Education method: Explanation  Education comprehension: verbalized understanding   HOME EXERCISE PROGRAM:  Access Code: [email or web address] URL: [email or web address] Date: 07/17/2022 Prepared by: Ria Comment  Exercises - Hooklying Lumbar Rotation  - 1 x daily - 7 x weekly - 2 sets - 10 reps - 3s hold - Supine March  - 1 x daily - 7 x weekly - 2 sets - 10  reps - 3s hold - Supine Bridge  - 1 x daily - 7 x weekly - 2 sets - 10 reps - 3s hold   ASSESSMENT:  CLINICAL IMPRESSION: Updated outcome measures with patient during visit today. Overall her pain as been worsening recently as she gets farther out from her last steroid injection. She is scheduled for a THA at the end of May and is  unable to get a repeat L hip injection until after her surgery. Pt was able to tolerate progression of LE strengthening today without an excessive increase in pain. Her worst L hip pain over the last week returned to 10/10. Her L hip abduction and adduction strength have both decreased due to pain. Plan to progress strengthening at future sessions including additional CKC exercises. She will benefit from PT services to address deficits in strength, range of motion, and pain in order to improve pain with household and work responsibilities.  OBJECTIVE IMPAIRMENTS: Abnormal gait, decreased ROM, decreased strength, and pain.   ACTIVITY LIMITATIONS: lifting, bending, standing, and squatting  PARTICIPATION LIMITATIONS: cleaning, shopping, community activity, and occupation  PERSONAL FACTORS: Past/current experiences, Time since onset of injury/illness/exacerbation, and 3+ comorbidities: depression, anxiety, migraines, chronic pain, and psoriatic arthritis  are also affecting patient's functional outcome.   REHAB POTENTIAL: Fair    CLINICAL DECISION MAKING: Unstable/unpredictable  EVALUATION COMPLEXITY: High   GOALS: Goals reviewed with patient? No  SHORT TERM GOALS: Target date: 12/02/2022   Pt will be independent with HEP in order to improve strength and range of motion as well as decrease hip pain to improve function at home and work. Baseline:  Goal status: ONGOING   LONG TERM GOALS: Target date: 12/30/2022   Pt will increase FOTO to at least 66 to demonstrate significant improvement in function at home and work related to L hip pain  Baseline: 07/08/22: 52; 11/05/22: 54; 12/16/22: Deferred; Goal status: PARTIALLY MET;  2.  Pt will decrease worst hip pain by at least 2 points on the NPRS in order to demonstrate clinically significant reduction in hip pain. Baseline: 07/08/22: worst: 10/10; 11/04/22: 5/10; 12/16/22: 10/10 Goal status: ACHIEVED;  3.  Pt will report at least 25% improvement in L  hip symptoms in order to demonstrate clinically significant reduction in hip pain/disability so she can return to increased frequency of exercise with less pain       Baseline: 09/25/22: achieved almost completely in response to injections, not as much due to PT interventions; 12/16/22: Worsening recently due to inability to get an injection prior to hip replacement; Goal status: ACHIEVED  4.  Pt will increase pain-free strength of L hip abduction/adduction by at least 1/2 MMT grade in order to demonstrate improvement in strength and function  Baseline: 07/08/22: 4/5 both with pain; 09/25/22: ACHIEVED; 12/16/22:  L hip abduction: 4-/5 with pain, L hip adduction: 4/5 with pain;  Goal status: ONGOING   PLAN: PT FREQUENCY: 1-2x/week  PT DURATION: 8 weeks  PLANNED INTERVENTIONS: Therapeutic exercises, Therapeutic activity, Neuromuscular re-education, Balance training, Gait training, Patient/Family education, Self Care, Joint mobilization, Joint manipulation, Vestibular training, Canalith repositioning, Orthotic/Fit training, DME instructions, Dry Needling, Electrical stimulation, Spinal manipulation, Spinal mobilization, Cryotherapy, Moist heat, Taping, Traction, Ultrasound, Ionotophoresis 4mg /ml Dexamethasone, Manual therapy, and Re-evaluation.  PLAN FOR NEXT SESSION: continue LE/hip strengthening  Sharalyn Ink Woodson Macha PT, DPT, GCS  9:06 PM,12/17/22

## 2022-12-16 ENCOUNTER — Ambulatory Visit: Payer: Federal, State, Local not specified - PPO | Admitting: Family Medicine

## 2022-12-16 ENCOUNTER — Ambulatory Visit: Payer: Federal, State, Local not specified - PPO

## 2022-12-16 DIAGNOSIS — M6281 Muscle weakness (generalized): Secondary | ICD-10-CM

## 2022-12-16 DIAGNOSIS — M25552 Pain in left hip: Secondary | ICD-10-CM | POA: Diagnosis not present

## 2022-12-23 ENCOUNTER — Ambulatory Visit: Payer: Federal, State, Local not specified - PPO

## 2022-12-23 DIAGNOSIS — M6281 Muscle weakness (generalized): Secondary | ICD-10-CM

## 2022-12-23 DIAGNOSIS — M25552 Pain in left hip: Secondary | ICD-10-CM | POA: Diagnosis not present

## 2022-12-23 NOTE — Therapy (Unsigned)
OUTPATIENT PHYSICAL THERAPY HIP TREATMENT  Patient Name: Barbara Faulkner MRN: [id number] DOB:1966-03-27, 57 y.o., female Today's Date: 12/24/2022   PT End of Session - 12/23/22 1322     Visit Number 21    Number of Visits 49    Date for PT Re-Evaluation 12/30/22    Authorization Type eval: 07/08/22    Authorization Time Period [employer] 2024 VL:75 combined visits per year    Authorization - Visit Number 10    Authorization - Number of Visits 75    PT Start Time 1318    PT Stop Time 1400    PT Time Calculation (min) 42 min    Activity Tolerance Patient tolerated treatment well    Behavior During Therapy [facility] for tasks assessed/performed            Past Medical History:  Diagnosis Date   Allergy    Anxiety    Chronic kidney disease    Chronic pain    Chronic sinusitis    COVID-19    09/14/20, 06/13/21   Depression    Heart murmur    Hiatal hernia    HPV (human papilloma virus) anogenital infection    Macromastia    Migraine    Psoriatic arthritis    Shingles    UTI (urinary tract infection)    ecoli 09/2021   Past Surgical History:  Procedure Laterality Date   BREAST BIOPSY Right 01/28/2018   US guided biopsy - heart shaped   BREAST REDUCTION SURGERY Bilateral 04/03/2020   Procedure: MAMMARY REDUCTION  (BREAST);  Surgeon: Contogiannis, Chales Abrahams, MD;  Location: [facility];  Service: Plastics;  Laterality: Bilateral;  HAVE LIPOSUCTION MACHINE AVAILABLE   CHOLECYSTECTOMY  2009   COLONOSCOPY WITH PROPOFOL N/A 07/19/2018   Procedure: COLONOSCOPY WITH PROPOFOL;  Surgeon: Toney Reil, MD;  Location: [facility] ENDOSCOPY;  Service: Gastroenterology;  Laterality: N/A;   COLONOSCOPY WITH PROPOFOL N/A 09/17/2021   Procedure: COLONOSCOPY WITH PROPOFOL;  Surgeon: Toney Reil, MD;  Location: [facility] ENDOSCOPY;  Service: Gastroenterology;  Laterality: N/A;   COLONOSCOPY WITH PROPOFOL N/A 09/18/2021   Procedure: COLONOSCOPY WITH PROPOFOL;  Surgeon: Toney Reil, MD;  Location: [facility] ENDOSCOPY;  Service: Gastroenterology;  Laterality: N/A;   ESOPHAGOGASTRODUODENOSCOPY (EGD) WITH PROPOFOL N/A 07/19/2018   Procedure: ESOPHAGOGASTRODUODENOSCOPY (EGD) WITH PROPOFOL;  Surgeon: Toney Reil, MD;  Location: [facility] ENDOSCOPY;  Service: Gastroenterology;  Laterality: N/A;   GASTRIC BYPASS     2015; duodenal switch    LAPAROSCOPIC GASTRIC BANDING  2008   removed 2009   REDUCTION MAMMAPLASTY     TUBAL LIGATION  1997   Patient Active Problem List   Diagnosis Date Noted   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) 10/21/2022   Lumbar radicular pain 10/01/2022   Fibromyalgia 10/01/2022   Normocytic anemia 06/13/2022   Elevated alkaline phosphatase level 05/28/2022   Chills 05/26/2022   Chronic left SI joint pain 11/13/2021   Piriformis syndrome, left 11/13/2021   Greater 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 11/13/2021   Primary osteoarthritis of right hip 09/16/2021   Plantar fasciitis 08/22/2021   Carpal tunnel syndrome on right 08/22/2021   Primary osteoarthritis of left hip 08/15/2021   Hyperphosphatemia 08/01/2021   Anemia in chronic kidney disease 04/18/2021   Chronic kidney disease, stage 3a 04/18/2021   Hyperparathyroidism due to renal insufficiency 04/18/2021   Hypertension 04/18/2021   Hiatal hernia 11/02/2020   History of migraine 11/02/2020   Gastroesophageal reflux disease 11/02/2020  Brain fog 11/02/2020   Depression, recurrent 03/18/2020   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 09/08/2018   Arthropathy of right shoulder 09/08/2018   Controlled substance agreement signed 09/08/2018   Myofascial pain syndrome 08/24/2018   Routine physical examination 08/24/2018   Iron deficiency anemia 08/24/2018   Elevated liver enzymes 07/19/2018   Vitamin D deficiency 07/19/2018   Colon cancer screening    Abdominal pain, epigastric 07/08/2018   Right shoulder pain 06/15/2018   Hemorrhoids 05/01/2017   Anxiety and  depression 11/24/2016   Lumbar pain with radiation down left leg 09/11/2016   Large breasts 09/11/2016   Parotiditis 05/01/2016   Abnormal laboratory test result 03/26/2016   Proteinuria 03/26/2016   Right hip pain 04/18/2015   Sinusitis 01/08/2014   Psoriatic arthritis 09/13/2013   DUB (dysfunctional uterine bleeding) 10/27/2012   Gastritis due to nonsteroidal anti-inflammatory drug 12/12/2011   Anaphylactic reaction due to shellfish 09/27/2010   THYROMEGALY 09/03/2010   ALLERGIC RHINITIS 03/15/2010   MIGRAINE Barbara Faulkner W/O INTRACT W/O STATUS MIGRNOSUS 05/17/2008   PCP: [name] FNP  REFERRING PROVIDER: Rosette Reveal MD  REFERRING DIAG: [phone] (ICD-10-CM) - Primary osteoarthritis of left hip, M53.3,G89.29 (ICD-10-CM) - Chronic left SI joint pain, G57.02 (ICD-10-CM) - Piriformis syndrome, left, M25.552 (ICD-10-CM) - Greater 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  Rationale for Evaluation and Treatment: Rehabilitation  THERAPY DIAG: Pain in left hip  Muscle weakness (generalized)  ONSET DATE: 09/02/2015 (approximate)  FOLLOW-UP APPT SCHEDULED WITH REFERRING PROVIDER: Yes   FROM INITIAL EVALUATION SUBJECTIVE:                                                                                                                                                                                         SUBJECTIVE STATEMENT: L hip pain  PERTINENT HISTORY:  Pt reports a diagnosis of Lyme Disease in 2017 with persistent widespread chronic joint pain after treatment.  Of note she has severe L hip pain as well as pain in her L knee and right shoulder. Her L hip pain has been worsening recently. She denies any pain in her R hip or L shoulder. She was previously receiving L hip injections from Dr. Mack Faulkner however she reports that he refused to continue with further injections so she was referred by pain management to Dr. Zigmund Faulkner. She saw Dr. Zigmund Faulkner and pt received a L hip  intraarticular injection as well as an injection in her L SIJ and L greater trochanter. The injections have helped with her pain. She was also referred to an orthopedic surgeon to discuss THR and referred for physical therapy. Pt expresses that she really enjoys exercising but  is very limited by her pain.   PAIN:  Low today, posterior thigh and later hip  PRECAUTIONS: None  WEIGHT BEARING RESTRICTIONS: No  FALLS: Has patient fallen in last 6 months? No  OBJECTIVE:   LE MMT: MMT (out of 5) Right   Left   Right 1/25 Left 1/25   Hip flexion 5 4+* 5/5 5/5  Hip extension (prone)     5/5 (5/5)  Hip abduction 4+ 4* 4+/5 4/5  Hip horizonal ABDCT/ADD   5/5 5/5  Hip adduction 4+ 4*    Hip internal rotation 5 5 5/5 4+/5  Hip external rotation 5 4* 5/5 4+/5  Knee flexion 4+ 4+* 5/5 4+/5  Knee extension 5 5 5/5 5/5  Ankle dorsiflexion 5 5 5/5 5/5    TODAY'S TREATMENT   SUBJECTIVE: Pt reporting 3/10 L hip pain upon arrival today. Her low back pain continues to do well. No specific questions currently.   PAIN: 3/10 L hip pain   Ther-ex  NuStep L1-4 x 10 minutes for warm-up and BLE strengtheing during interval history with therapist adjusting resistance; Total Gym (TG) Level 22 (L22) squats 2, 9# dumbbells (DB) 2 x 20; TG L22 bilateral heel raises with 2, 9# DB 2 x 20; Hooklying clams with manual resistance 2 x 10; Adductor squeezes with manual resistance 2 x 10; Hooklying bridges 2 x 10; Hooklying L SLR 2 x 10; Supine L hip straight leg hip abduction with manual resistance from therapist x 10; Supine L hip straight leg hip adduction with manual resistance from therapist x 10;   Manual Therapy  Supine L hip long axis gentle distraction with belt assist, 30s/bout x 3 bouts; Supine L hip inferior mobilizations with belt assist and hip flexed to 90 degrees 30s/bout x 3 bouts; Supine L hip gentle medial to lateral mobilizations with hip flexed to 45 degrees, belt assist, 30s/bout x 3  bouts; Supine L hip gentle medial to lateral/inferior mobilizations with hip flexed to 45 degrees and slightly externally rotated within comfortable range, belt assist, 30s/bout x 3 bouts;   Not performed: R sidelying L hip straight leg abduction 2 x 10; R sidelying L hip reverse clam 2 x 10; Resisted side stepping with blue tband around ankles 30' x 4 lengths with rest after first 2; Forward 12" step-ups leading with LLE x 10; L lateral step-ups x 10; 12" R lateral heel taps x 10; Hooklying hip fall-outs 2 x 10 BLE;   PATIENT EDUCATION:  Education details: Pt educated throughout session about proper posture and technique with exercises. Improved exercise technique, movement at target joints, use of target muscles after min to mod verbal, visual, tactile cues. Person educated: Patient Education method: Explanation  Education comprehension: verbalized understanding   HOME EXERCISE PROGRAM:  Access Code: [email or web address] URL: [email or web address] Date: 07/17/2022 Prepared by: Ria Comment  Exercises - Hooklying Lumbar Rotation  - 1 x daily - 7 x weekly - 2 sets - 10 reps - 3s hold - Supine March  - 1 x daily - 7 x weekly - 2 sets - 10 reps - 3s hold - Supine Bridge  - 1 x daily - 7 x weekly - 2 sets - 10 reps - 3s hold   ASSESSMENT:  CLINICAL IMPRESSION: Pt demonstrates excellent motivation during session today. Progressed strengthening during session. Also performed hip mobilizations to help with pain management and pt reports improvement in her pain at the end of the session. She is scheduled for L hip anterior approach  THA at the end of May. Plan to continue with strengthening leading up to her surgery in order to maintain as much strength as possible. Plan to progress strengthening at future sessions including additional CKC exercises. She will benefit from PT services to address deficits in strength, range of motion, and pain in order to improve pain with household and  work responsibilities.  OBJECTIVE IMPAIRMENTS: Abnormal gait, decreased ROM, decreased strength, and pain.   ACTIVITY LIMITATIONS: lifting, bending, standing, and squatting  PARTICIPATION LIMITATIONS: cleaning, shopping, community activity, and occupation  PERSONAL FACTORS: Past/current experiences, Time since onset of injury/illness/exacerbation, and 3+ comorbidities: depression, anxiety, migraines, chronic pain, and psoriatic arthritis  are also affecting patient's functional outcome.   REHAB POTENTIAL: Fair    CLINICAL DECISION MAKING: Unstable/unpredictable  EVALUATION COMPLEXITY: High   GOALS: Goals reviewed with patient? No  SHORT TERM GOALS: Target date: 12/02/2022   Pt will be independent with HEP in order to improve strength and range of motion as well as decrease hip pain to improve function at home and work. Baseline:  Goal status: ONGOING   LONG TERM GOALS: Target date: 12/30/2022   Pt will increase FOTO to at least 66 to demonstrate significant improvement in function at home and work related to L hip pain  Baseline: 07/08/22: 52; 11/05/22: 54; 12/16/22: Deferred; Goal status: PARTIALLY MET;  2.  Pt will decrease worst hip pain by at least 2 points on the NPRS in order to demonstrate clinically significant reduction in hip pain. Baseline: 07/08/22: worst: 10/10; 11/04/22: 5/10; 12/16/22: 10/10 Goal status: ACHIEVED;  3.  Pt will report at least 25% improvement in L hip symptoms in order to demonstrate clinically significant reduction in hip pain/disability so she can return to increased frequency of exercise with less pain       Baseline: 09/25/22: achieved almost completely in response to injections, not as much due to PT interventions; 12/16/22: Worsening recently due to inability to get an injection prior to hip replacement; Goal status: ACHIEVED  4.  Pt will increase pain-free strength of L hip abduction/adduction by at least 1/2 MMT grade in order to demonstrate  improvement in strength and function  Baseline: 07/08/22: 4/5 both with pain; 09/25/22: ACHIEVED; 12/16/22:  L hip abduction: 4-/5 with pain, L hip adduction: 4/5 with pain;  Goal status: ONGOING   PLAN: PT FREQUENCY: 1-2x/week  PT DURATION: 8 weeks  PLANNED INTERVENTIONS: Therapeutic exercises, Therapeutic activity, Neuromuscular re-education, Balance training, Gait training, Patient/Family education, Self Care, Joint mobilization, Joint manipulation, Vestibular training, Canalith repositioning, Orthotic/Fit training, DME instructions, Dry Needling, Electrical stimulation, Spinal manipulation, Spinal mobilization, Cryotherapy, Moist heat, Taping, Traction, Ultrasound, Ionotophoresis /ml Dexamethasone, Manual therapy, and Re-evaluation.  PLAN FOR NEXT SESSION: continue LE/hip strengthening  Sharalyn Ink Simya Tercero PT, DPT, GCS  12:02 PM,12/24/22

## 2022-12-29 DIAGNOSIS — N2581 Secondary hyperparathyroidism of renal origin: Secondary | ICD-10-CM | POA: Diagnosis not present

## 2022-12-29 DIAGNOSIS — N1831 Chronic kidney disease, stage 3a: Secondary | ICD-10-CM | POA: Diagnosis not present

## 2022-12-29 DIAGNOSIS — R300 Dysuria: Secondary | ICD-10-CM | POA: Diagnosis not present

## 2022-12-29 DIAGNOSIS — R809 Proteinuria, unspecified: Secondary | ICD-10-CM | POA: Diagnosis not present

## 2022-12-29 DIAGNOSIS — N281 Cyst of kidney, acquired: Secondary | ICD-10-CM | POA: Diagnosis not present

## 2022-12-30 ENCOUNTER — Ambulatory Visit: Payer: Federal, State, Local not specified - PPO

## 2022-12-30 DIAGNOSIS — M25552 Pain in left hip: Secondary | ICD-10-CM

## 2022-12-30 DIAGNOSIS — M6281 Muscle weakness (generalized): Secondary | ICD-10-CM | POA: Diagnosis not present

## 2022-12-30 NOTE — Therapy (Signed)
OUTPATIENT PHYSICAL THERAPY HIP TREATMENT/RECERTIFICATION  Patient Name: AKINA MAISH MRN: [id number] DOB:[birth year], 57 y.o., female Today's Date: 12/30/2022   PT End of Session - 12/30/22 1331     Visit Number 22    Number of Visits 65    Date for PT Re-Evaluation 02/24/23    Authorization Type eval: 07/08/22    Authorization Time Period [employer] 2024 VL:75 combined visits per year    Authorization - Visit Number 11    Authorization - Number of Visits 75    PT Start Time 1319    PT Stop Time 1400    PT Time Calculation (min) 41 min    Activity Tolerance Patient tolerated treatment well    Behavior During Therapy [facility] for tasks assessed/performed            Past Medical History:  Diagnosis Date   Allergy    Anxiety    Chronic kidney disease    Chronic pain    Chronic sinusitis    COVID-19    09/14/20, 06/13/21   Depression    Heart murmur    Hiatal hernia    HPV (human papilloma virus) anogenital infection    Macromastia    Migraine    Psoriatic arthritis (HCC)    Shingles    UTI (urinary tract infection)    ecoli 09/2021   Past Surgical History:  Procedure Laterality Date   BREAST BIOPSY Right 01/28/2018   US guided biopsy - heart shaped   BREAST REDUCTION SURGERY Bilateral 04/03/2020   Procedure: MAMMARY REDUCTION  (BREAST);  Surgeon: Contogiannis, Chales Abrahams, MD;  Location: [facility];  Service: Plastics;  Laterality: Bilateral;  HAVE LIPOSUCTION MACHINE AVAILABLE   CHOLECYSTECTOMY  2009   COLONOSCOPY WITH PROPOFOL N/A 07/19/2018   Procedure: COLONOSCOPY WITH PROPOFOL;  Surgeon: Toney Reil, MD;  Location: [facility] ENDOSCOPY;  Service: Gastroenterology;  Laterality: N/A;   COLONOSCOPY WITH PROPOFOL N/A 09/17/2021   Procedure: COLONOSCOPY WITH PROPOFOL;  Surgeon: Toney Reil, MD;  Location: [facility] ENDOSCOPY;  Service: Gastroenterology;  Laterality: N/A;   COLONOSCOPY WITH PROPOFOL N/A 09/18/2021   Procedure: COLONOSCOPY WITH PROPOFOL;   Surgeon: Toney Reil, MD;  Location: [facility] ENDOSCOPY;  Service: Gastroenterology;  Laterality: N/A;   ESOPHAGOGASTRODUODENOSCOPY (EGD) WITH PROPOFOL N/A 07/19/2018   Procedure: ESOPHAGOGASTRODUODENOSCOPY (EGD) WITH PROPOFOL;  Surgeon: Toney Reil, MD;  Location: [facility] ENDOSCOPY;  Service: Gastroenterology;  Laterality: N/A;   GASTRIC BYPASS     2015; duodenal switch    LAPAROSCOPIC GASTRIC BANDING  2008   removed 2009   REDUCTION MAMMAPLASTY     TUBAL LIGATION  1997   Patient Active Problem List   Diagnosis Date Noted   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) 10/21/2022   Lumbar radicular pain 10/01/2022   Fibromyalgia 10/01/2022   Normocytic anemia 06/13/2022   Elevated alkaline phosphatase level 05/28/2022   Chills 05/26/2022   Chronic left SI joint pain 11/13/2021   Piriformis syndrome, left 11/13/2021   Greater 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 11/13/2021   Primary osteoarthritis of right hip 09/16/2021   Plantar fasciitis 08/22/2021   Carpal tunnel syndrome on right 08/22/2021   Primary osteoarthritis of left hip 08/15/2021   Hyperphosphatemia 08/01/2021   Anemia in chronic kidney disease 04/18/2021   Chronic kidney disease, stage 3a (HCC) 04/18/2021   Hyperparathyroidism due to renal insufficiency (HCC) 04/18/2021   Hypertension 04/18/2021   Hiatal hernia 11/02/2020   History of migraine 11/02/2020   Gastroesophageal  reflux disease 11/02/2020   Brain fog 11/02/2020   Depression, recurrent ([place]) 03/18/2020   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 09/08/2018   Arthropathy of right shoulder 09/08/2018   Controlled substance agreement signed 09/08/2018   Myofascial pain syndrome 08/24/2018   Routine physical examination 08/24/2018   Iron deficiency anemia 08/24/2018   Elevated liver enzymes 07/19/2018   Vitamin D deficiency 07/19/2018   Colon cancer screening    Abdominal pain, epigastric 07/08/2018   Right shoulder pain 06/15/2018    Hemorrhoids 05/01/2017   Anxiety and depression 11/24/2016   Lumbar pain with radiation down left leg 09/11/2016   Large breasts 09/11/2016   Parotiditis 05/01/2016   Abnormal laboratory test result 03/26/2016   Proteinuria 03/26/2016   Right hip pain 04/18/2015   Sinusitis 01/08/2014   Psoriatic arthritis ([place]) 09/13/2013   DUB (dysfunctional uterine bleeding) 10/27/2012   Gastritis due to nonsteroidal anti-inflammatory drug 12/12/2011   Anaphylactic reaction due to shellfish 09/27/2010   THYROMEGALY 09/03/2010   ALLERGIC RHINITIS 03/15/2010   MIGRAINE Elio Forget W/O INTRACT W/O STATUS MIGRNOSUS 05/17/2008   PCP: [name] FNP  REFERRING PROVIDER: Rosette Reveal MD  REFERRING DIAG: [phone] (ICD-10-CM) - Primary osteoarthritis of left hip, M53.3,G89.29 (ICD-10-CM) - Chronic left SI joint pain, G57.02 (ICD-10-CM) - Piriformis syndrome, left, M25.552 (ICD-10-CM) - Greater 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  Rationale for Evaluation and Treatment: Rehabilitation  THERAPY DIAG: Pain in left hip  Muscle weakness (generalized)  ONSET DATE: 09/02/2015 (approximate)  FOLLOW-UP APPT SCHEDULED WITH REFERRING PROVIDER: Yes   FROM INITIAL EVALUATION SUBJECTIVE:                                                                                                                                                                                         SUBJECTIVE STATEMENT: L hip pain  PERTINENT HISTORY:  Pt reports a diagnosis of Lyme Disease in 2017 with persistent widespread chronic joint pain after treatment.  Of note she has severe L hip pain as well as pain in her L knee and right shoulder. Her L hip pain has been worsening recently. She denies any pain in her R hip or L shoulder. She was previously receiving L hip injections from Dr. Mack Guise however she reports that he refused to continue with further injections so she was referred by pain management to Dr. Zigmund Daniel. She  saw Dr. Zigmund Daniel and pt received a L hip intraarticular injection as well as an injection in her L SIJ and L greater trochanter. The injections have helped with her pain. She was also referred to an orthopedic surgeon to discuss THR and referred for physical therapy. Pt  expresses that she really enjoys exercising but is very limited by her pain.   PAIN:  Low today, posterior thigh and later hip  PRECAUTIONS: None  WEIGHT BEARING RESTRICTIONS: No  FALLS: Has patient fallen in last 6 months? No  OBJECTIVE:   LE MMT: MMT (out of 5) Right   Left   Right 1/25 Left 1/25   Hip flexion 5 4+* 5/5 5/5  Hip extension (prone)     5/5 (5/5)  Hip abduction 4+ 4* 4+/5 4/5  Hip horizonal ABDCT/ADD   5/5 5/5  Hip adduction 4+ 4*    Hip internal rotation 5 5 5/5 4+/5  Hip external rotation 5 4* 5/5 4+/5  Knee flexion 4+ 4+* 5/5 4+/5  Knee extension 5 5 5/5 5/5  Ankle dorsiflexion 5 5 5/5 5/5    TODAY'S TREATMENT   SUBJECTIVE: Pt reporting that her L hip is doing well today. She has been doing a good job coordinating her medications in the morning to help manage her pain. Her low back pain continues to do well. No specific questions currently.   PAIN: L hip pain   Ther-ex  NuStep L1-4 x 10 minutes for warm-up and BLE strengtheing during interval history with therapist adjusting resistance; Total Gym (TG) Level 22 (L22) squats 2, 9# dumbbells (DB) 2 x 20; TG L22 bilateral heel raises with 2, 9# DB 2 x 20; TG L22 single leg squats with 2, 9# DB 2 x 10; Hooklying clams with manual resistance 2 x 10; Hooklying adductor squeezes with manual resistance 2 x 10;   Manual Therapy  Supine L hip long axis gentle distraction with belt assist, 30s/bout x 3 bouts; Supine L hip inferior mobilizations with belt assist and hip flexed to 90 degrees 30s/bout x 3 bouts; Supine L hip gentle medial to lateral mobilizations with hip flexed to 45 degrees, belt assist, 30s/bout x 3 bouts; Supine L hip gentle  medial to lateral/inferior mobilizations with hip flexed to 45 degrees and slightly externally rotated within comfortable range, belt assist, 30s/bout x 3 bouts;   Not performed: R sidelying L hip straight leg abduction 2 x 10; R sidelying L hip reverse clam 2 x 10; Resisted side stepping with blue tband around ankles 30' x 4 lengths with rest after first 2; Forward 12" step-ups leading with LLE x 10; L lateral step-ups x 10; 12" R lateral heel taps x 10; Hooklying hip fall-outs 2 x 10 BLE; Hooklying bridges 2 x 10; Hooklying L SLR 2 x 10; Supine L hip straight leg hip abduction with manual resistance from therapist x 10; Supine L hip straight leg hip adduction with manual resistance from therapist x 10;   PATIENT EDUCATION:  Education details: Pt educated throughout session about proper posture and technique with exercises. Improved exercise technique, movement at target joints, use of target muscles after min to mod verbal, visual, tactile cues. Person educated: Patient Education method: Explanation  Education comprehension: verbalized understanding   HOME EXERCISE PROGRAM:  Access Code: [email or web address] URL: [email or web address] Date: 07/17/2022 Prepared by: Ria Comment  Exercises - Hooklying Lumbar Rotation  - 1 x daily - 7 x weekly - 2 sets - 10 reps - 3s hold - Supine March  - 1 x daily - 7 x weekly - 2 sets - 10 reps - 3s hold - Supine Bridge  - 1 x daily - 7 x weekly - 2 sets - 10 reps - 3s hold   ASSESSMENT:  CLINICAL IMPRESSION: Updated outcome measures  with patient during 12/16/22. Overall her pain as been worsening recently as she gets farther out from her last steroid injection. She is scheduled for a THA at the end of May and is unable to get a repeat L hip injection until after her surgery. Pt was able to tolerate continuation of LLE strengthening today without an excessive increase in pain. Repeated hip mobilizations to help with pain management and pt  reports improvement in her pain at the end of the session. Her worst L hip pain is now still reaching 10/10. Her L hip abduction and adduction strength have both decreased due to pain. Plan to progress strengthening at future sessions including additional CKC exercises. She will benefit from PT services to address deficits in strength, range of motion, and pain in order to improve pain with household and work responsibilities.  OBJECTIVE IMPAIRMENTS: Abnormal gait, decreased ROM, decreased strength, and pain.   ACTIVITY LIMITATIONS: lifting, bending, standing, and squatting  PARTICIPATION LIMITATIONS: cleaning, shopping, community activity, and occupation  PERSONAL FACTORS: Past/current experiences, Time since onset of injury/illness/exacerbation, and 3+ comorbidities: depression, anxiety, migraines, chronic pain, and psoriatic arthritis  are also affecting patient's functional outcome.   REHAB POTENTIAL: Fair    CLINICAL DECISION MAKING: Unstable/unpredictable  EVALUATION COMPLEXITY: High   GOALS: Goals reviewed with patient? No  SHORT TERM GOALS: Target date: 01/27/2023   Pt will be independent with HEP in order to improve strength and range of motion as well as decrease hip pain to improve function at home and work. Baseline:  Goal status: ONGOING   LONG TERM GOALS: Target date: 02/24/23  Pt will increase FOTO to at least 66 to demonstrate significant improvement in function at home and work related to L hip pain  Baseline: 07/08/22: 52; 11/05/22: 54; 12/16/22: Deferred;  Goal status: PARTIALLY MET;  2.  Pt will decrease worst hip pain by at least 2 points on the NPRS in order to demonstrate clinically significant reduction in hip pain. Baseline: 07/08/22: worst: 10/10; 11/04/22: 5/10; 12/16/22: 10/10 Goal status: ACHIEVED;  3.  Pt will report at least 25% improvement in L hip symptoms in order to demonstrate clinically significant reduction in hip pain/disability so she can return to  increased frequency of exercise with less pain       Baseline: 09/25/22: achieved almost completely in response to injections, not as much due to PT interventions; 12/16/22: Worsening recently due to inability to get an injection prior to hip replacement; Goal status: ACHIEVED  4.  Pt will increase pain-free strength of L hip abduction/adduction by at least 1/2 MMT grade in order to demonstrate improvement in strength and function  Baseline: 07/08/22: 4/5 both with pain; 09/25/22: ACHIEVED; 12/16/22:  L hip abduction: 4-/5 with pain, L hip adduction: 4/5 with pain;  Goal status: ONGOING   PLAN: PT FREQUENCY: 1-2x/week  PT DURATION: 8 weeks  PLANNED INTERVENTIONS: Therapeutic exercises, Therapeutic activity, Neuromuscular re-education, Balance training, Gait training, Patient/Family education, Self Care, Joint mobilization, Joint manipulation, Vestibular training, Canalith repositioning, Orthotic/Fit training, DME instructions, Dry Needling, Electrical stimulation, Spinal manipulation, Spinal mobilization, Cryotherapy, Moist heat, Taping, Traction, Ultrasound, Ionotophoresis 4mg /ml Dexamethasone, Manual therapy, and Re-evaluation.  PLAN FOR NEXT SESSION: continue LE/hip strengthening  Sharalyn Ink Jaythen Hamme PT, DPT, GCS  9:33 PM,12/30/22

## 2023-01-01 ENCOUNTER — Ambulatory Visit
Payer: Federal, State, Local not specified - PPO | Attending: Student in an Organized Health Care Education/Training Program | Admitting: Student in an Organized Health Care Education/Training Program

## 2023-01-01 ENCOUNTER — Other Ambulatory Visit: Payer: Self-pay | Admitting: *Deleted

## 2023-01-01 ENCOUNTER — Encounter: Payer: Self-pay | Admitting: Student in an Organized Health Care Education/Training Program

## 2023-01-01 VITALS — BP 102/81 | HR 92 | Temp 97.3°F | Resp 16 | Ht 63.0 in | Wt 160.0 lb

## 2023-01-01 DIAGNOSIS — M1612 Unilateral primary osteoarthritis, left hip: Secondary | ICD-10-CM | POA: Insufficient documentation

## 2023-01-01 DIAGNOSIS — M7918 Myalgia, other site: Secondary | ICD-10-CM | POA: Diagnosis not present

## 2023-01-01 DIAGNOSIS — M48061 Spinal stenosis, lumbar region without neurogenic claudication: Secondary | ICD-10-CM

## 2023-01-01 DIAGNOSIS — G894 Chronic pain syndrome: Secondary | ICD-10-CM

## 2023-01-01 DIAGNOSIS — G8929 Other chronic pain: Secondary | ICD-10-CM | POA: Diagnosis not present

## 2023-01-01 MED ORDER — OXYCODONE-ACETAMINOPHEN 5-325 MG PO TABS
1.0000 | ORAL_TABLET | Freq: Three times a day (TID) | ORAL | 0 refills | Status: DC | PRN
Start: 2023-01-31 — End: 2023-04-21

## 2023-01-01 MED ORDER — TRAMADOL HCL ER 200 MG PO TB24
200.0000 mg | ORAL_TABLET | Freq: Every day | ORAL | 2 refills | Status: DC
Start: 2023-01-27 — End: 2023-04-21

## 2023-01-01 MED ORDER — CYCLOBENZAPRINE HCL 5 MG PO TABS
5.0000 mg | ORAL_TABLET | Freq: Every evening | ORAL | 1 refills | Status: DC | PRN
Start: 2023-01-01 — End: 2023-04-21

## 2023-01-01 MED ORDER — VITAMIN D 25 MCG (1000 UNIT) PO TABS: 1000.0000 [IU] | ORAL_TABLET | Freq: Every day | ORAL | 5 refills | Status: DC

## 2023-01-01 NOTE — Progress Notes (Signed)
PROVIDER NOTE: Information contained herein reflects review and annotations entered in association with encounter. Interpretation of such information and data should be left to medically-trained personnel. Information provided to patient can be located elsewhere in the medical record under "Patient Instructions". Document created using STT-dictation technology, any transcriptional errors that may result from process are unintentional.    Patient: Barbara Faulkner  Service Category: E/M  Provider: Edward Jolly, MD  DOB: 1966-02-05  DOS: 02/26/2021  Specialty: Interventional Pain Management  MRN: [id number]  Setting: Ambulatory outpatient  PCP: McLean-Scocuzza, Pasty Spillers, MD  Type: Established Patient    Referring Provider: Quentin Ore *  Location: Office  Delivery: Face-to-face     HPI  [name], a 57 y.o. year old female, is here today because of her Chronic pain syndrome [G89.4]. Ms. 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 Pain Assessment: Severity of Chronic pain is reported as a 4 /10. Location: Hip Left/left groin to left hip. Onset: 1 to 4 weeks ago. Quality: Sharp. Timing: Constant. Modifying factor(s): meds. Vitals:  height is 5\' 3"  (1.6 m) and weight is 160 lb (72.6 kg). Her temporal temperature is 97.3 F (36.3 C) (abnormal). Her blood pressure is 102/81 and her pulse is 92. Her respiration is 16 and oxygen saturation is 98%.   Reason for encounter: both, medication management and post-procedure assessment.     Excellent analgesic and functional response after left L4 transforaminal ESI, see details below Patient has an upcoming appointment for left hip  replacement with Dr. Odis Luster at [facility].  She was provided with surgeons letter detailing postoperative opioid prescribing to be handled by surgical team for acute pain.  She is instructed to continue her chronic pain medications as prescribed. Refill of oxycodone and tramadol as below for chronic pain. Continue gabapentin 400 mg nightly, Flexeril 5 mg nightly as needed  Post-procedure evaluation   Procedure: Lumbar trans-foraminal epidural steroid injection (L-TFESI) #1  Laterality: Left (-LT)  Level: L4 nerve root(s) Imaging: Fluoroscopy-guided         Anesthesia: Local anesthesia (1-2% Lidocaine) DOS: 11/10/2022  Performed by: Edward Jolly, MD  Purpose: Diagnostic/Therapeutic Indications: Lumbar radicular pain severe enough to impact quality of life or function. 1. Lumbar radicular pain   2. Lumbar pain with radiation down left leg   3. 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 Neural foraminal stenosis of lumbar spine    NAS-11 Pain score:   Pre-procedure: 6 /10   Post-procedure: 0-No pain/10      Effectiveness:  Initial hour after procedure: 100 %  Subsequent 4-6 hours post-procedure: 100 % Analgesia past initial 6 hours: 100 %  Ongoing improvement:  Analgesic:  100% Function: Barbara Faulkner reports improvement in function ROM: Barbara Faulkner reports improvement in ROM   Pharmacotherapy Assessment   Analgesic: Tramadol 200 mg ER, continue oxycodone 5 mg daily for breakthrough pain.     Monitoring: [place] PMP: PDMP reviewed during this encounter.       Pharmacotherapy: No side-effects or adverse reactions reported. Compliance: No problems identified. Effectiveness: Clinically acceptable.  UDS:  Summary  Date Value Ref Range Status  10/01/2022 Note  Final    Comment:    ====================================================================  [employer] Select 13 (MW) ==================================================================== Test                              Result       Flag       Units  Drug Present and Declared for Prescription Verification   Oxycodone                      293          EXPECTED   ng/mg creat   Oxymorphone                    161          EXPECTED   ng/mg creat   Noroxycodone                   707          EXPECTED   ng/mg creat   Noroxymorphone                 144          EXPECTED   ng/mg creat    Sources of oxycodone are scheduled prescription medications.    Oxymorphone, noroxycodone, and noroxymorphone are expected    metabolites of oxycodone. Oxymorphone is also available as a    scheduled prescription medication.    Tramadol                       >3937        EXPECTED   ng/mg creat   O-Desmethyltramadol            >3937        EXPECTED   ng/mg creat   N-Desmethyltramadol            >3937        EXPECTED   ng/mg creat    Source of tramadol is a prescription medication. O-desmethyltramadol    and N-desmethyltramadol are expected metabolites of tramadol.  ==================================================================== Test                      Result    Flag   Units      Ref Range   Creatinine              127              mg/dL      >=16 ==================================================================== Declared Medications:  The flagging and interpretation on this report are based on the  following declared medications.  Unexpected results may arise from  inaccuracies in the declared medications.   **Note: The testing scope of this panel includes these medications:   Oxycodone (Percocet)  Tramadol (Ultram)   **Note: The testing scope of this panel does not include the  following reported medications:   Acetaminophen (Percocet)  Calcitriol  Cholecalciferol  Cyclobenzaprine (Flexeril)  Duloxetine (Cymbalta)  Gabapentin (Neurontin)  Lamotrigine (Lamictal)  Pantoprazole (Protonix)  Quetiapine (Seroquel)  Vitamin D ==================================================================== For  clinical consultation, please call [phone]. ====================================================================       ROS  Constitutional: Denies any fever or chills Gastrointestinal: No reported hemesis, hematochezia, vomiting, or acute GI distress Musculoskeletal:  Left hip pain Neurological: No reported episodes of acute onset apraxia, aphasia, dysarthria, agnosia, amnesia, paralysis, loss of coordination, or loss of consciousness  Medication Review  Cholecalciferol, DULoxetine, QUEtiapine, Vitamin D, calcitRIOL, cyclobenzaprine, gabapentin, lamoTRIgine, oxyCODONE-acetaminophen,  and traMADol  History Review  Allergy: Ms. Forquer is allergic to shellfish allergy and nsaids. Drug: Ms. Wunschel  reports no history of drug use. Alcohol:  reports that she does not currently use alcohol. Tobacco:  reports that she quit smoking about 19 years ago. Her smoking use included cigarettes. She has a 40.00 pack-year smoking history. She has never used smokeless tobacco. Social: Ms. Nolde  reports that she quit smoking about 19 years ago. Her smoking use included cigarettes. She has a 40.00 pack-year smoking history. She has never used smokeless tobacco. She reports that she does not currently use alcohol. She reports that she does not use drugs. Medical:  has a past medical history of Allergy, Anxiety, Chronic kidney disease, Chronic pain, Chronic sinusitis, COVID-19, Depression, Heart murmur, Hiatal hernia, HPV (human papilloma virus) anogenital infection, Macromastia, Migraine, Psoriatic arthritis (HCC), Shingles, and UTI (urinary tract infection). Surgical: Ms. Conley  has a past surgical history that includes Cholecystectomy (2009); Tubal ligation (1997); Laparoscopic gastric banding (2008); Gastric bypass; Breast biopsy (Right, 01/28/2018); Colonoscopy with propofol (N/A, 07/19/2018); Esophagogastroduodenoscopy (egd) with propofol (N/A, 07/19/2018); Breast reduction surgery (Bilateral,  04/03/2020); Colonoscopy with propofol (N/A, 09/17/2021); Colonoscopy with propofol (N/A, 09/18/2021); and Reduction mammaplasty. Family: family history includes HIV in her father; Hepatitis in her maternal uncle; Hyperlipidemia in her mother; Hypertension in her mother; Lung cancer in her maternal grandfather; Stroke in her mother.  Laboratory Chemistry Profile   Renal Lab Results  Component Value Date   BUN 18 06/11/2022   CREATININE 1.10 06/11/2022   BCR NOT APPLICABLE 07/14/2018   GFR 57.17 (L) 06/11/2022   GFRAA >60 02/15/2014   GFRNONAA 51 (L) 05/26/2022     Hepatic Lab Results  Component Value Date   AST 85 (H) 06/11/2022   ALT 90 (H) 06/11/2022   ALBUMIN 4.0 06/11/2022   ALKPHOS 150 (H) 06/11/2022   ALKPHOS 171 (H) 06/11/2022   AMYLASE 33 09/25/2014   LIPASE 124 10/04/2013     Electrolytes Lab Results  Component Value Date   NA 142 06/11/2022   K 4.3 06/11/2022   CL 110 06/11/2022   CALCIUM 8.7 06/11/2022   MG 1.9 07/14/2018   PHOS 5.0 (H) 09/25/2014     Bone Lab Results  Component Value Date   VD25OH 40.34 06/11/2022     Inflammation (CRP: Acute Phase) (ESR: Chronic Phase) Lab Results  Component Value Date   CRP <1.0 03/10/2019   ESRSEDRATE 3 03/10/2019       Note: Above Lab results reviewed.  Recent Imaging Review  MM 3D SCREEN BREAST BILATERAL CLINICAL DATA:  Screening.  EXAM: DIGITAL SCREENING BILATERAL MAMMOGRAM WITH TOMOSYNTHESIS AND CAD  TECHNIQUE: Bilateral screening digital craniocaudal and mediolateral oblique mammograms were obtained. Bilateral screening digital breast tomosynthesis was performed. The images were evaluated with computer-aided detection.  COMPARISON:  Previous exam(s).  ACR Breast Density Category b: There are scattered areas of fibroglandular density.  FINDINGS: There are no findings suspicious for malignancy.  IMPRESSION: No mammographic evidence of malignancy. A result letter of this screening mammogram  will be mailed directly to the patient.  RECOMMENDATION: Screening mammogram in one year. (Code:[id number]-B-01Y)  BI-RADS CATEGORY  1: Negative.  Electronically Signed   By: Harmon Pier M.D.   On: 12/02/2022 15:47  Note: Reviewed        Physical Exam  General appearance: Well nourished, well developed, and well hydrated. In no apparent acute distress Mental status: Alert, oriented x 3 (person, place, & time)  Respiratory: No evidence of acute respiratory distress Eyes: PERLA Vitals: BP 102/81   Pulse 92   Temp (!) 97.3 F (36.3 C) (Temporal)   Resp 16   Ht 5\' 3"  (1.6 m)   Wt 160 lb (72.6 kg)   LMP 04/02/2014   SpO2 98%   BMI 28.34 kg/m  BMI: Estimated body mass index is 28.34 kg/m as calculated from the following:   Height as of this encounter: 5\' 3"  (1.6 m).   Weight as of this encounter: 160 lb (72.6 kg). Ideal: Ideal body weight: 52.4 kg (115 lb 8.3 oz) Adjusted ideal body weight: 60.5 kg (133 lb 5 oz)   Lumbar Spine Area Exam  Skin & Axial Inspection: No masses, redness, or swelling Alignment: Symmetrical Functional ROM: Pain restricted ROM affecting primarily the left Stability: No instability detected Muscle Tone/Strength: Functionally intact. No obvious neuro-muscular anomalies detected. Sensory (Neurological): Improved left L4-L5  Gait & Posture Assessment  Ambulation: Unassisted Gait: Relatively normal for age and body habitus Posture: WNL  Lower Extremity Exam    Side: Right lower extremity  Side: Left lower extremity  Stability: No instability observed          Stability: No instability observed          Skin & Extremity Inspection: Skin color, temperature, and hair growth are WNL. No peripheral edema or cyanosis. No masses, redness, swelling, asymmetry, or associated skin lesions. No contractures.  Skin & Extremity Inspection: Skin color, temperature, and hair growth are WNL. No peripheral edema or cyanosis. No masses, redness, swelling, asymmetry, or  associated skin lesions. No contractures.  Functional ROM: Unrestricted ROM                  Functional ROM: Pain restricted ROM for hip joint          Muscle Tone/Strength: Functionally intact. No obvious neuro-muscular anomalies detected.  Muscle Tone/Strength: Functionally intact. No obvious neuro-muscular anomalies detected.  Sensory (Neurological): Unimpaired        Sensory (Neurological): Unimpaired        DTR: Patellar: deferred today Achilles: deferred today Plantar: deferred today  DTR: Patellar: deferred today Achilles: deferred today Plantar: deferred today  Palpation: No palpable anomalies  Palpation: No palpable anomalies     Assessment   Status Diagnosis  Having a Flare-up Having a Flare-up Controlled 1. Chronic pain syndrome   2. Primary osteoarthritis of left hip   3. Chronic musculoskeletal pain   4. Neural foraminal stenosis of lumbar spine            Plan of Care  Ms. BREANNE OLVERA has a current medication list which includes the following long-term medication(s): duloxetine, gabapentin, and quetiapine.  Continue with physical therapy.  Refill of medications as below.  Recommend lumbar spine x-ray, flexion-extension as well as lumbar MRI to evaluate for any neuroforaminal stenosis, canal stenosis that could be contributing to her radiating left leg pain. Renew urine toxicology screen as below. Continue multimodal analgesics including Flexeril 5 mg nightly, Cymbalta 60 mg daily, gabapentin 400 mg nightly. Continue with psychiatric management and care.  Pharmacotherapy (Medications Ordered): Meds ordered this encounter  Medications   traMADol (ULTRAM-ER) 200 MG 24 hr tablet    Sig: Take 1 tablet (200 mg total) by mouth daily.    Dispense:  30 tablet    Refill:  2   oxyCODONE-acetaminophen (PERCOCET/ROXICET) 5-325 MG tablet    Sig: Take 1 tablet by mouth every 8 (eight) hours as needed for  severe pain.    Dispense:  90 tablet    Refill:  0    cyclobenzaprine (FLEXERIL) 5 MG tablet    Sig: Take 1 tablet (5 mg total) by mouth at bedtime as needed for muscle spasms.    Dispense:  90 tablet    Refill:  1   No orders of the defined types were placed in this encounter.    Follow-up plan:   Return in about 16 weeks (around 04/23/2023) for Medication Management, in person.   Recent Visits Date Type Provider Dept  11/10/22 Procedure visit Edward Jolly, MD [facility]  10/21/22 Office Visit Edward Jolly, MD [facility]  10/16/22 Office Visit Edward Jolly, MD [facility]  Showing recent visits within past 90 days and meeting all other requirements Today's Visits Date Type Provider Dept  01/01/23 Office Visit Edward Jolly, MD [facility]  Showing today's visits and meeting all other requirements Future Appointments No visits were found meeting these conditions. Showing future appointments within next 90 days and meeting all other requirements  I discussed the assessment and treatment plan with the patient. The patient was provided an opportunity to ask questions and all were answered. The patient agreed with the plan and demonstrated an understanding of the instructions.  Patient advised to call back or seek an in-person evaluation if the symptoms or condition worsens.  Duration of encounter:39minutes.  Note by: Edward Jolly, MD Date: 01/01/2023; Time: 3:05 PM

## 2023-01-01 NOTE — Progress Notes (Signed)
Nursing Pain Medication Assessment:  Safety precautions to be maintained throughout the outpatient stay will include: orient to surroundings, keep bed in low position, maintain call bell within reach at all times, provide assistance with transfer out of bed and ambulation.  Medication Inspection Compliance: Pill count conducted under aseptic conditions, in front of the patient. Neither the pills nor the bottle was removed from the patient's sight at any time. Once count was completed pills were immediately returned to the patient in their original bottle.  Medication #1: Tramadol (Ultram) Pill/Patch Count:  28 of 30 pills remain Pill/Patch Appearance: Markings consistent with prescribed medication Bottle Appearance: Standard pharmacy container. Clearly labeled. Filled Date: 04 / 28 / 2024 Last Medication intake:  Today  Medication #2: Oxycodone/APAP Pill/Patch Count:  30 of 90 pills remain Pill/Patch Appearance: Markings consistent with prescribed medication Bottle Appearance: Standard pharmacy container. Clearly labeled. Filled Date: 02 / 13 / 2024 Last Medication intake:  Today

## 2023-01-01 NOTE — Patient Instructions (Signed)
Surgeons letter regarding post operative opioid prescribing- they need to manage your acute pain, continue to take what I am prescribing for chronic pain

## 2023-01-05 NOTE — Therapy (Unsigned)
OUTPATIENT PHYSICAL THERAPY HIP TREATMENT/RECERTIFICATION  Patient Name: Barbara Faulkner MRN: [id number] DOB:October 26, 1965, 57 y.o., female Today's Date: 01/05/2023    Past Medical History:  Diagnosis Date   Allergy    Anxiety    Chronic kidney disease    Chronic pain    Chronic sinusitis    COVID-19    09/14/20, 06/13/21   Depression    Heart murmur    Hiatal hernia    HPV (human papilloma virus) anogenital infection    Macromastia    Migraine    Psoriatic arthritis (HCC)    Shingles    UTI (urinary tract infection)    ecoli 09/2021   Past Surgical History:  Procedure Laterality Date   BREAST BIOPSY Right 01/28/2018   US guided biopsy - heart shaped   BREAST REDUCTION SURGERY Bilateral 04/03/2020   Procedure: MAMMARY REDUCTION  (BREAST);  Surgeon: Contogiannis, Chales Abrahams, MD;  Location: [facility];  Service: Plastics;  Laterality: Bilateral;  HAVE LIPOSUCTION MACHINE AVAILABLE   CHOLECYSTECTOMY  2009   COLONOSCOPY WITH PROPOFOL N/A 07/19/2018   Procedure: COLONOSCOPY WITH PROPOFOL;  Surgeon: Toney Reil, MD;  Location: [facility] ENDOSCOPY;  Service: Gastroenterology;  Laterality: N/A;   COLONOSCOPY WITH PROPOFOL N/A 09/17/2021   Procedure: COLONOSCOPY WITH PROPOFOL;  Surgeon: Toney Reil, MD;  Location: [facility] ENDOSCOPY;  Service: Gastroenterology;  Laterality: N/A;   COLONOSCOPY WITH PROPOFOL N/A 09/18/2021   Procedure: COLONOSCOPY WITH PROPOFOL;  Surgeon: Toney Reil, MD;  Location: [facility] ENDOSCOPY;  Service: Gastroenterology;  Laterality: N/A;   ESOPHAGOGASTRODUODENOSCOPY (EGD) WITH PROPOFOL N/A 07/19/2018   Procedure: ESOPHAGOGASTRODUODENOSCOPY (EGD) WITH PROPOFOL;  Surgeon: Toney Reil, MD;  Location: [facility] ENDOSCOPY;  Service: Gastroenterology;  Laterality: N/A;   GASTRIC BYPASS     2015; duodenal switch    LAPAROSCOPIC GASTRIC BANDING  2008   removed 2009   REDUCTION MAMMAPLASTY     TUBAL LIGATION  1997   Patient Active Problem  List   Diagnosis Date Noted   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) 10/21/2022   Lumbar radicular pain 10/01/2022   Fibromyalgia 10/01/2022   Normocytic anemia 06/13/2022   Elevated alkaline phosphatase level 05/28/2022   Chills 05/26/2022   Chronic left SI joint pain 11/13/2021   Piriformis syndrome, left 11/13/2021   Greater 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 11/13/2021   Primary osteoarthritis of right hip 09/16/2021   Plantar fasciitis 08/22/2021   Carpal tunnel syndrome on right 08/22/2021   Primary osteoarthritis of left hip 08/15/2021   Hyperphosphatemia 08/01/2021   Anemia in chronic kidney disease 04/18/2021   Chronic kidney disease, stage 3a (HCC) 04/18/2021   Hyperparathyroidism due to renal insufficiency (HCC) 04/18/2021   Hypertension 04/18/2021   Hiatal hernia 11/02/2020   History of migraine 11/02/2020   Gastroesophageal reflux disease 11/02/2020   Brain fog 11/02/2020   Depression, recurrent (HCC) 03/18/2020   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 09/08/2018   Arthropathy of right shoulder 09/08/2018   Controlled substance agreement signed 09/08/2018   Myofascial pain syndrome 08/24/2018   Routine physical examination 08/24/2018   Iron deficiency anemia 08/24/2018   Elevated liver enzymes 07/19/2018   Vitamin D deficiency 07/19/2018   Colon cancer screening    Abdominal pain, epigastric 07/08/2018   Right shoulder pain 06/15/2018   Hemorrhoids 05/01/2017   Anxiety and depression 11/24/2016   Lumbar pain with radiation down left leg 09/11/2016   Large breasts 09/11/2016   Parotiditis 05/01/2016   Abnormal laboratory  test result 03/26/2016   Proteinuria 03/26/2016   Right hip pain 04/18/2015   Sinusitis 01/08/2014   Psoriatic arthritis (HCC) 09/13/2013   DUB (dysfunctional uterine bleeding) 10/27/2012   Gastritis due to nonsteroidal anti-inflammatory drug 12/12/2011   Anaphylactic reaction due to shellfish 09/27/2010    THYROMEGALY 09/03/2010   ALLERGIC RHINITIS 03/15/2010   MIGRAINE [name] 05/17/2008   PCP: Rennie Plowman FNP  REFERRING PROVIDER: [name] MD  REFERRING DIAG: [phone] (ICD-10-CM) - Primary osteoarthritis of left hip, M53.3,G89.29 (ICD-10-CM) - Chronic left SI joint pain, G57.02 (ICD-10-CM) - Piriformis syndrome, left, M25.552 (ICD-10-CM) - Greater 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  Rationale for Evaluation and Treatment: Rehabilitation  THERAPY DIAG: Pain in left hip  Muscle weakness (generalized)  ONSET DATE: 09/02/2015 (approximate)  FOLLOW-UP APPT SCHEDULED WITH REFERRING PROVIDER: Yes   FROM INITIAL EVALUATION SUBJECTIVE:                                                                                                                                                                                         SUBJECTIVE STATEMENT: L hip pain  PERTINENT HISTORY:  Pt reports a diagnosis of Lyme Disease in 2017 with persistent widespread chronic joint pain after treatment.  Of note she has severe L hip pain as well as pain in her L knee and right shoulder. Her L hip pain has been worsening recently. She denies any pain in her R hip or L shoulder. She was previously receiving L hip injections from Dr. Martha Clan however she reports that he refused to continue with further injections so she was referred by pain management to Dr. Ashley Royalty. She saw Dr. Ashley Royalty and pt received a L hip intraarticular injection as well as an injection in her L SIJ and L greater trochanter. The injections have helped with her pain. She was also referred to an orthopedic surgeon to discuss THR and referred for physical therapy. Pt expresses that she really enjoys exercising but is very limited by her pain.   PAIN:  Low today, posterior thigh and later hip  PRECAUTIONS: None  WEIGHT BEARING RESTRICTIONS: No  FALLS: Has patient fallen in last 6 months?  No  OBJECTIVE:   LE MMT: MMT (out of 5) Right   Left   Right 1/25 Left 1/25   Hip flexion 5 4+* 5/5 5/5  Hip extension (prone)     5/5 (5/5)  Hip abduction 4+ 4* 4+/5 4/5  Hip horizonal ABDCT/ADD   5/5 5/5  Hip adduction 4+ 4*    Hip internal rotation 5 5 5/5 4+/5  Hip external rotation 5 4* 5/5 4+/5  Knee flexion 4+ 4+* 5/5 4+/5  Knee extension 5 5 5/5 5/5  Ankle dorsiflexion 5 5 5/5 5/5    TODAY'S TREATMENT   SUBJECTIVE: Pt reporting that her L hip is doing well today. She has been doing a good job coordinating her medications in the morning to help manage her pain. Her low back pain continues to do well. No specific questions currently.   PAIN: L hip pain   Ther-ex  NuStep L1-4 x 10 minutes for warm-up and BLE strengtheing during interval history with therapist adjusting resistance; Total Gym (TG) Level 22 (L22) squats 2, 9# dumbbells (DB) 2 x 20; TG L22 bilateral heel raises with 2, 9# DB 2 x 20; TG L22 single leg squats with 2, 9# DB 2 x 10; Hooklying clams with manual resistance 2 x 10; Hooklying adductor squeezes with manual resistance 2 x 10;   Manual Therapy  Supine L hip long axis gentle distraction with belt assist, 30s/bout x 3 bouts; Supine L hip inferior mobilizations with belt assist and hip flexed to 90 degrees 30s/bout x 3 bouts; Supine L hip gentle medial to lateral mobilizations with hip flexed to 45 degrees, belt assist, 30s/bout x 3 bouts; Supine L hip gentle medial to lateral/inferior mobilizations with hip flexed to 45 degrees and slightly externally rotated within comfortable range, belt assist, 30s/bout x 3 bouts;   Not performed: R sidelying L hip straight leg abduction 2 x 10; R sidelying L hip reverse clam 2 x 10; Resisted side stepping with blue tband around ankles 30' x 4 lengths with rest after first 2; Forward 12" step-ups leading with LLE x 10; L lateral step-ups x 10; 12" R lateral heel taps x 10; Hooklying hip fall-outs 2 x 10  BLE; Hooklying bridges 2 x 10; Hooklying L SLR 2 x 10; Supine L hip straight leg hip abduction with manual resistance from therapist x 10; Supine L hip straight leg hip adduction with manual resistance from therapist x 10;   PATIENT EDUCATION:  Education details: Pt educated throughout session about proper posture and technique with exercises. Improved exercise technique, movement at target joints, use of target muscles after min to mod verbal, visual, tactile cues. Person educated: Patient Education method: Explanation  Education comprehension: verbalized understanding   HOME EXERCISE PROGRAM:  Access Code: [email or web address] URL: [email or web address] Date: 07/17/2022 Prepared by: Ria Comment  Exercises - Hooklying Lumbar Rotation  - 1 x daily - 7 x weekly - 2 sets - 10 reps - 3s hold - Supine March  - 1 x daily - 7 x weekly - 2 sets - 10 reps - 3s hold - Supine Bridge  - 1 x daily - 7 x weekly - 2 sets - 10 reps - 3s hold   ASSESSMENT:  CLINICAL IMPRESSION: Updated outcome measures with patient during 12/16/22. Overall her pain as been worsening recently as she gets farther out from her last steroid injection. She is scheduled for a THA at the end of May and is unable to get a repeat L hip injection until after her surgery. Pt was able to tolerate continuation of LLE strengthening today without an excessive increase in pain. Repeated hip mobilizations to help with pain management and pt reports improvement in her pain at the end of the session. Her worst L hip pain is now still reaching 10/10. Her L hip abduction and adduction strength have both decreased due to pain. Plan to progress strengthening at future sessions including additional CKC exercises. She will  benefit from PT services to address deficits in strength, range of motion, and pain in order to improve pain with household and work responsibilities.  OBJECTIVE IMPAIRMENTS: Abnormal gait, decreased ROM, decreased  strength, and pain.   ACTIVITY LIMITATIONS: lifting, bending, standing, and squatting  PARTICIPATION LIMITATIONS: cleaning, shopping, community activity, and occupation  PERSONAL FACTORS: Past/current experiences, Time since onset of injury/illness/exacerbation, and 3+ comorbidities: depression, anxiety, migraines, chronic pain, and psoriatic arthritis  are also affecting patient's functional outcome.   REHAB POTENTIAL: Fair    CLINICAL DECISION MAKING: Unstable/unpredictable  EVALUATION COMPLEXITY: High   GOALS: Goals reviewed with patient? No  SHORT TERM GOALS: Target date: 01/27/2023   Pt will be independent with HEP in order to improve strength and range of motion as well as decrease hip pain to improve function at home and work. Baseline:  Goal status: ONGOING   LONG TERM GOALS: Target date: 02/24/23  Pt will increase FOTO to at least 66 to demonstrate significant improvement in function at home and work related to L hip pain  Baseline: 07/08/22: 52; 11/05/22: 54; 12/16/22: Deferred;  Goal status: PARTIALLY MET;  2.  Pt will decrease worst hip pain by at least 2 points on the NPRS in order to demonstrate clinically significant reduction in hip pain. Baseline: 07/08/22: worst: 10/10; 11/04/22: 5/10; 12/16/22: 10/10 Goal status: ACHIEVED;  3.  Pt will report at least 25% improvement in L hip symptoms in order to demonstrate clinically significant reduction in hip pain/disability so she can return to increased frequency of exercise with less pain       Baseline: 09/25/22: achieved almost completely in response to injections, not as much due to PT interventions; 12/16/22: Worsening recently due to inability to get an injection prior to hip replacement; Goal status: ACHIEVED  4.  Pt will increase pain-free strength of L hip abduction/adduction by at least 1/2 MMT grade in order to demonstrate improvement in strength and function  Baseline: 07/08/22: 4/5 both with pain; 09/25/22: ACHIEVED;  12/16/22:  L hip abduction: 4-/5 with pain, L hip adduction: 4/5 with pain;  Goal status: ONGOING   PLAN: PT FREQUENCY: 1-2x/week  PT DURATION: 8 weeks  PLANNED INTERVENTIONS: Therapeutic exercises, Therapeutic activity, Neuromuscular re-education, Balance training, Gait training, Patient/Family education, Self Care, Joint mobilization, Joint manipulation, Vestibular training, Canalith repositioning, Orthotic/Fit training, DME instructions, Dry Needling, Electrical stimulation, Spinal manipulation, Spinal mobilization, Cryotherapy, Moist heat, Taping, Traction, Ultrasound, Ionotophoresis 4mg /ml Dexamethasone, Manual therapy, and Re-evaluation.  PLAN FOR NEXT SESSION: continue LE/hip strengthening  Lynnea Maizes PT, DPT, GCS  9:28 AM,01/05/23

## 2023-01-06 ENCOUNTER — Ambulatory Visit: Payer: Federal, State, Local not specified - PPO | Attending: Family Medicine

## 2023-01-06 DIAGNOSIS — M6281 Muscle weakness (generalized): Secondary | ICD-10-CM | POA: Diagnosis not present

## 2023-01-06 DIAGNOSIS — M25552 Pain in left hip: Secondary | ICD-10-CM | POA: Diagnosis not present

## 2023-01-13 ENCOUNTER — Ambulatory Visit: Payer: Federal, State, Local not specified - PPO

## 2023-01-16 NOTE — Therapy (Signed)
OUTPATIENT PHYSICAL THERAPY HIP TREATMENT/DISCHARGE  Patient Name: Barbara Faulkner MRN: [id number] DOB:12/07/1965, 57 y.o., female Today's Date: 01/21/2023   PT End of Session - 01/20/23 1332     Visit Number 24    Number of Visits 65    Date for PT Re-Evaluation 02/24/23    Authorization Type eval: 07/08/22    Authorization Time Period [employer] 2024 VL:75 combined visits per year    Authorization - Visit Number 13    Authorization - Number of Visits 75    PT Start Time 1328    PT Stop Time 1400    PT Time Calculation (min) 32 min    Activity Tolerance Patient tolerated treatment well    Behavior During Therapy [facility] for tasks assessed/performed            Past Medical History:  Diagnosis Date   Allergy    Anxiety    Chronic kidney disease    Chronic pain    Chronic sinusitis    COVID-19    09/14/20, 06/13/21   Depression    Heart murmur    Hiatal hernia    HPV (human papilloma virus) anogenital infection    Macromastia    Migraine    Psoriatic arthritis (HCC)    Shingles    UTI (urinary tract infection)    ecoli 09/2021   Past Surgical History:  Procedure Laterality Date   BREAST BIOPSY Right 01/28/2018   US guided biopsy - heart shaped   BREAST REDUCTION SURGERY Bilateral 04/03/2020   Procedure: MAMMARY REDUCTION  (BREAST);  Surgeon: Contogiannis, Chales Abrahams, MD;  Location: [facility];  Service: Plastics;  Laterality: Bilateral;  HAVE LIPOSUCTION MACHINE AVAILABLE   CHOLECYSTECTOMY  2009   COLONOSCOPY WITH PROPOFOL N/A 07/19/2018   Procedure: COLONOSCOPY WITH PROPOFOL;  Surgeon: Toney Reil, MD;  Location: [facility] ENDOSCOPY;  Service: Gastroenterology;  Laterality: N/A;   COLONOSCOPY WITH PROPOFOL N/A 09/17/2021   Procedure: COLONOSCOPY WITH PROPOFOL;  Surgeon: Toney Reil, MD;  Location: [facility] ENDOSCOPY;  Service: Gastroenterology;  Laterality: N/A;   COLONOSCOPY WITH PROPOFOL N/A 09/18/2021   Procedure: COLONOSCOPY WITH PROPOFOL;  Surgeon:  Toney Reil, MD;  Location: [facility] ENDOSCOPY;  Service: Gastroenterology;  Laterality: N/A;   ESOPHAGOGASTRODUODENOSCOPY (EGD) WITH PROPOFOL N/A 07/19/2018   Procedure: ESOPHAGOGASTRODUODENOSCOPY (EGD) WITH PROPOFOL;  Surgeon: Toney Reil, MD;  Location: [facility] ENDOSCOPY;  Service: Gastroenterology;  Laterality: N/A;   GASTRIC BYPASS     2015; duodenal switch    LAPAROSCOPIC GASTRIC BANDING  2008   removed 2009   REDUCTION MAMMAPLASTY     TUBAL LIGATION  1997   Patient Active Problem List   Diagnosis Date Noted   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) 10/21/2022   Lumbar radicular pain 10/01/2022   Fibromyalgia 10/01/2022   Normocytic anemia 06/13/2022   Elevated alkaline phosphatase level 05/28/2022   Chills 05/26/2022   Chronic left SI joint pain 11/13/2021   Piriformis syndrome, left 11/13/2021   Greater 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 11/13/2021   Primary osteoarthritis of right hip 09/16/2021   Plantar fasciitis 08/22/2021   Carpal tunnel syndrome on right 08/22/2021   Primary osteoarthritis of left hip 08/15/2021   Hyperphosphatemia 08/01/2021   Anemia in chronic kidney disease 04/18/2021   Chronic kidney disease, stage 3a (HCC) 04/18/2021   Hyperparathyroidism due to renal insufficiency (HCC) 04/18/2021   Hypertension 04/18/2021   Hiatal hernia 11/02/2020   History of migraine 11/02/2020   Gastroesophageal  reflux disease 11/02/2020   Brain fog 11/02/2020   Depression, recurrent (HCC) 03/18/2020   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 09/08/2018   Arthropathy of right shoulder 09/08/2018   Controlled substance agreement signed 09/08/2018   Myofascial pain syndrome 08/24/2018   Routine physical examination 08/24/2018   Iron deficiency anemia 08/24/2018   Elevated liver enzymes 07/19/2018   Vitamin D deficiency 07/19/2018   Colon cancer screening    Abdominal pain, epigastric 07/08/2018   Right shoulder pain 06/15/2018    Hemorrhoids 05/01/2017   Anxiety and depression 11/24/2016   Lumbar pain with radiation down left leg 09/11/2016   Large breasts 09/11/2016   Parotiditis 05/01/2016   Abnormal laboratory test result 03/26/2016   Proteinuria 03/26/2016   Right hip pain 04/18/2015   Sinusitis 01/08/2014   Psoriatic arthritis (HCC) 09/13/2013   DUB (dysfunctional uterine bleeding) 10/27/2012   Gastritis due to nonsteroidal anti-inflammatory drug 12/12/2011   Anaphylactic reaction due to shellfish 09/27/2010   THYROMEGALY 09/03/2010   ALLERGIC RHINITIS 03/15/2010   MIGRAINE [name] 05/17/2008   PCP: Rennie Plowman FNP  REFERRING PROVIDER: [name] MD  REFERRING DIAG: [phone] (ICD-10-CM) - Primary osteoarthritis of left hip, M53.3,G89.29 (ICD-10-CM) - Chronic left SI joint pain, G57.02 (ICD-10-CM) - Piriformis syndrome, left, M25.552 (ICD-10-CM) - Greater 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  Rationale for Evaluation and Treatment: Rehabilitation  THERAPY DIAG: Pain in left hip  Muscle weakness (generalized)  ONSET DATE: 09/02/2015 (approximate)  FOLLOW-UP APPT SCHEDULED WITH REFERRING PROVIDER: Yes   FROM INITIAL EVALUATION SUBJECTIVE:                                                                                                                                                                                         SUBJECTIVE STATEMENT: L hip pain  PERTINENT HISTORY:  Pt reports a diagnosis of Lyme Disease in 2017 with persistent widespread chronic joint pain after treatment.  Of note she has severe L hip pain as well as pain in her L knee and right shoulder. Her L hip pain has been worsening recently. She denies any pain in her R hip or L shoulder. She was previously receiving L hip injections from Dr. Martha Clan however she reports that he refused to continue with further injections so she was referred by pain management to Dr. Ashley Royalty. She saw  Dr. Ashley Royalty and pt received a L hip intraarticular injection as well as an injection in her L SIJ and L greater trochanter. The injections have helped with her pain. She was also referred to an orthopedic surgeon to discuss THR and referred for physical therapy. Pt  expresses that she really enjoys exercising but is very limited by her pain.   PAIN:  Low today, posterior thigh and later hip  PRECAUTIONS: None  WEIGHT BEARING RESTRICTIONS: No  FALLS: Has patient fallen in last 6 months? No  OBJECTIVE:   LE MMT: MMT (out of 5) Right   Left   Right 1/25 Left 1/25   Hip flexion 5 4+* 5/5 5/5  Hip extension (prone)     5/5 (5/5)  Hip abduction 4+ 4* 4+/5 4/5  Hip horizonal ABDCT/ADD   5/5 5/5  Hip adduction 4+ 4*    Hip internal rotation 5 5 5/5 4+/5  Hip external rotation 5 4* 5/5 4+/5  Knee flexion 4+ 4+* 5/5 4+/5  Knee extension 5 5 5/5 5/5  Ankle dorsiflexion 5 5 5/5 5/5     TODAY'S TREATMENT   SUBJECTIVE: Pt reporting that her L hip is painful today. She forgot to take her pain medications this morning. No specific questions currently.   PAIN: L hip pain   Ther-ex  NuStep L1-4 x 10 minutes for warm-up and BLE strengtheing during interval history with therapist adjusting resistance; Hooklying marches 2 x 10 BLE; Hooklying bridges 2 x 10; Hooklying L hip fall-outs x 10 BLE; Hooklying clams with manual resistance 2 x 10; Hooklying adductor squeezes with manual resistance 2 x 10;   Manual Therapy  Supine L hip long axis gentle distraction with belt assist, 30s/bout x 3 bouts; Supine L hip inferior mobilizations with belt assist and hip flexed to 90 degrees 30s/bout x 3 bouts; Supine L hip gentle medial to lateral mobilizations with hip flexed to 45 degrees, belt assist, 30s/bout x 3 bouts; Supine L hip gentle medial to lateral/inferior mobilizations with hip flexed to 45 degrees and slightly externally rotated within comfortable range, belt assist, 30s/bout x 3  bouts;   Not performed: Total Gym (TG) Level 22 (L22) squats 2, 9# dumbbells (DB) 2 x 20; TG L22 bilateral heel raises with 2, 9# DB 2 x 20; TG L22 single leg squats with 2, 9# DB 2 x 10; Forward 6" step-ups alternating leading LE x 10 on each side; Lateral step-ups x 10 toward each side; Calf raises off step 2 x 20; 12" R lateral heel taps x 10; TRX squats 2 x 10; Resisted side stepping with blue tband around ankles in // bars x multiple laps, 2 bouts; Supine L hip straight leg hip abduction with manual resistance from therapist x 10; Supine L hip straight leg hip adduction with manual resistance from therapist x 10; R sidelying L hip straight leg abduction 2 x 10; R sidelying L hip reverse clam 2 x 10;     PATIENT EDUCATION:  Education details: Pt educated throughout session about proper posture and technique with exercises. Improved exercise technique, movement at target joints, use of target muscles after min to mod verbal, visual, tactile cues. Person educated: Patient Education method: Explanation  Education comprehension: verbalized understanding   HOME EXERCISE PROGRAM:  Access Code: [email or web address] URL: [email or web address] Date: 07/17/2022 Prepared by: Ria Comment  Exercises - Hooklying Lumbar Rotation  - 1 x daily - 7 x weekly - 2 sets - 10 reps - 3s hold - Supine March  - 1 x daily - 7 x weekly - 2 sets - 10 reps - 3s hold - Supine Bridge  - 1 x daily - 7 x weekly - 2 sets - 10 reps - 3s hold   ASSESSMENT:  CLINICAL IMPRESSION: Pt demonstrates  excellent motivation during session today however pain in hip is slightly higher currently as she forgot to take her pain medication this morning. Pt was able to tolerate continuation of LLE strengthening today without an excessive increase in pain. Repeated hip mobilizations to help with pain management and pt reports improvement in her pain. She is scheduled for a THA next Wednesday 01/28/23 so she will be  discharged today. Post-surgical PT evaluation scheduled for next Friday 01/30/23.   OBJECTIVE IMPAIRMENTS: Abnormal gait, decreased ROM, decreased strength, and pain.   ACTIVITY LIMITATIONS: lifting, bending, standing, and squatting  PARTICIPATION LIMITATIONS: cleaning, shopping, community activity, and occupation  PERSONAL FACTORS: Past/current experiences, Time since onset of injury/illness/exacerbation, and 3+ comorbidities: depression, anxiety, migraines, chronic pain, and psoriatic arthritis  are also affecting patient's functional outcome.   REHAB POTENTIAL: Fair    CLINICAL DECISION MAKING: Unstable/unpredictable  EVALUATION COMPLEXITY: High   GOALS: Goals reviewed with patient? No  SHORT TERM GOALS: Target date: 01/27/2023   Pt will be independent with HEP in order to improve strength and range of motion as well as decrease hip pain to improve function at home and work. Baseline:  Goal status: ONGOING   LONG TERM GOALS: Target date: 02/24/23  Pt will increase FOTO to at least 66 to demonstrate significant improvement in function at home and work related to L hip pain  Baseline: 07/08/22: 52; 11/05/22: 54; 12/16/22: Deferred;  Goal status: PARTIALLY MET;  2.  Pt will decrease worst hip pain by at least 2 points on the NPRS in order to demonstrate clinically significant reduction in hip pain. Baseline: 07/08/22: worst: 10/10; 11/04/22: 5/10; 12/16/22: 10/10 Goal status: ACHIEVED;  3.  Pt will report at least 25% improvement in L hip symptoms in order to demonstrate clinically significant reduction in hip pain/disability so she can return to increased frequency of exercise with less pain       Baseline: 09/25/22: achieved almost completely in response to injections, not as much due to PT interventions; 12/16/22: Worsening recently due to inability to get an injection prior to hip replacement; Goal status: ACHIEVED  4.  Pt will increase pain-free strength of L hip abduction/adduction  by at least 1/2 MMT grade in order to demonstrate improvement in strength and function  Baseline: 07/08/22: 4/5 both with pain; 09/25/22: ACHIEVED; 12/16/22:  L hip abduction: 4-/5 with pain, L hip adduction: 4/5 with pain;  Goal status: ONGOING   PLAN: PT FREQUENCY: 1-2x/week  PT DURATION: 8 weeks  PLANNED INTERVENTIONS: Therapeutic exercises, Therapeutic activity, Neuromuscular re-education, Balance training, Gait training, Patient/Family education, Self Care, Joint mobilization, Joint manipulation, Vestibular training, Canalith repositioning, Orthotic/Fit training, DME instructions, Dry Needling, Electrical stimulation, Spinal manipulation, Spinal mobilization, Cryotherapy, Moist heat, Taping, Traction, Ultrasound, Ionotophoresis 4mg /ml Dexamethasone, Manual therapy, and Re-evaluation.  PLAN FOR NEXT SESSION: Discharge  Lynnea Maizes PT, DPT, GCS  8:28 AM,01/21/23

## 2023-01-20 ENCOUNTER — Ambulatory Visit: Payer: Federal, State, Local not specified - PPO

## 2023-01-20 DIAGNOSIS — M25552 Pain in left hip: Secondary | ICD-10-CM

## 2023-01-20 DIAGNOSIS — M6281 Muscle weakness (generalized): Secondary | ICD-10-CM | POA: Diagnosis not present

## 2023-01-22 DIAGNOSIS — M1612 Unilateral primary osteoarthritis, left hip: Secondary | ICD-10-CM | POA: Diagnosis not present

## 2023-01-22 DIAGNOSIS — M25552 Pain in left hip: Secondary | ICD-10-CM | POA: Diagnosis not present

## 2023-01-22 DIAGNOSIS — Z01818 Encounter for other preprocedural examination: Secondary | ICD-10-CM | POA: Insufficient documentation

## 2023-01-22 DIAGNOSIS — Z0189 Encounter for other specified special examinations: Secondary | ICD-10-CM | POA: Diagnosis not present

## 2023-01-23 HISTORY — PX: TOTAL HIP ARTHROPLASTY: SHX124

## 2023-01-27 ENCOUNTER — Ambulatory Visit: Payer: Federal, State, Local not specified - PPO

## 2023-01-28 DIAGNOSIS — M1612 Unilateral primary osteoarthritis, left hip: Secondary | ICD-10-CM | POA: Diagnosis not present

## 2023-01-30 ENCOUNTER — Ambulatory Visit: Payer: Federal, State, Local not specified - PPO

## 2023-02-01 NOTE — Therapy (Signed)
OUTPATIENT PHYSICAL THERAPY HIP EVALUATION  Patient Name: Barbara Faulkner MRN: [id number] DOB:1966-02-11, 57 y.o., female Today's Date: 02/04/2023   PT End of Session - 02/04/23 1048     Visit Number 1    Number of Visits 17    Date for PT Re-Evaluation 03/30/23    Authorization Type eval: 02/02/23    Authorization Time Period [employer] 2024 VL:75 combined visits per year    Authorization - Visit Number 14    Authorization - Number of Visits 75    PT Start Time 1020    PT Stop Time 1100    PT Time Calculation (min) 40 min    Equipment Utilized During Treatment Gait belt    Activity Tolerance Patient tolerated treatment well    Behavior During Therapy WFL for tasks assessed/performed            Past Medical History:  Diagnosis Date   Allergy    Anxiety    Chronic kidney disease    Chronic pain    Chronic sinusitis    COVID-19    09/14/20, 06/13/21   Depression    Heart murmur    Hiatal hernia    HPV (human papilloma virus) anogenital infection    Macromastia    Migraine    Psoriatic arthritis (HCC)    Shingles    UTI (urinary tract infection)    ecoli 09/2021   Past Surgical History:  Procedure Laterality Date   BREAST BIOPSY Right 01/28/2018   US guided biopsy - heart shaped   BREAST REDUCTION SURGERY Bilateral 04/03/2020   Procedure: MAMMARY REDUCTION  (BREAST);  Surgeon: Contogiannis, Chales Abrahams, MD;  Location: [facility];  Service: Plastics;  Laterality: Bilateral;  HAVE LIPOSUCTION MACHINE AVAILABLE   CHOLECYSTECTOMY  2009   COLONOSCOPY WITH PROPOFOL N/A 07/19/2018   Procedure: COLONOSCOPY WITH PROPOFOL;  Surgeon: Toney Reil, MD;  Location: [facility] ENDOSCOPY;  Service: Gastroenterology;  Laterality: N/A;   COLONOSCOPY WITH PROPOFOL N/A 09/17/2021   Procedure: COLONOSCOPY WITH PROPOFOL;  Surgeon: Toney Reil, MD;  Location: [facility] ENDOSCOPY;  Service: Gastroenterology;  Laterality: N/A;   COLONOSCOPY WITH PROPOFOL N/A 09/18/2021   Procedure:  COLONOSCOPY WITH PROPOFOL;  Surgeon: Toney Reil, MD;  Location: [facility] ENDOSCOPY;  Service: Gastroenterology;  Laterality: N/A;   ESOPHAGOGASTRODUODENOSCOPY (EGD) WITH PROPOFOL N/A 07/19/2018   Procedure: ESOPHAGOGASTRODUODENOSCOPY (EGD) WITH PROPOFOL;  Surgeon: Toney Reil, MD;  Location: [facility] ENDOSCOPY;  Service: Gastroenterology;  Laterality: N/A;   GASTRIC BYPASS     2015; duodenal switch    LAPAROSCOPIC GASTRIC BANDING  2008   removed 2009   REDUCTION MAMMAPLASTY     TUBAL LIGATION  1997   Patient Active Problem List   Diagnosis Date Noted   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) 10/21/2022   Lumbar radicular pain 10/01/2022   Fibromyalgia 10/01/2022   Normocytic anemia 06/13/2022   Elevated alkaline phosphatase level 05/28/2022   Chills 05/26/2022   Chronic left SI joint pain 11/13/2021   Piriformis syndrome, left 11/13/2021   Greater 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 11/13/2021   Primary osteoarthritis of right hip 09/16/2021   Plantar fasciitis 08/22/2021   Carpal tunnel syndrome on right 08/22/2021   Primary osteoarthritis of left hip 08/15/2021   Hyperphosphatemia 08/01/2021   Anemia in chronic kidney disease 04/18/2021   Chronic kidney disease, stage 3a (HCC) 04/18/2021   Hyperparathyroidism due to renal insufficiency (HCC) 04/18/2021   Hypertension 04/18/2021   Hiatal hernia 11/02/2020  History of migraine 11/02/2020   Gastroesophageal reflux disease 11/02/2020   Brain fog 11/02/2020   Depression, recurrent (HCC) 03/18/2020   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 09/08/2018   Arthropathy of right shoulder 09/08/2018   Controlled substance agreement signed 09/08/2018   Myofascial pain syndrome 08/24/2018   Routine physical examination 08/24/2018   Iron deficiency anemia 08/24/2018   Elevated liver enzymes 07/19/2018   Vitamin D deficiency 07/19/2018   Colon cancer screening    Abdominal pain, epigastric 07/08/2018    Right shoulder pain 06/15/2018   Hemorrhoids 05/01/2017   Anxiety and depression 11/24/2016   Lumbar pain with radiation down left leg 09/11/2016   Large breasts 09/11/2016   Parotiditis 05/01/2016   Abnormal laboratory test result 03/26/2016   Proteinuria 03/26/2016   Right hip pain 04/18/2015   Sinusitis 01/08/2014   Psoriatic arthritis (HCC) 09/13/2013   DUB (dysfunctional uterine bleeding) 10/27/2012   Gastritis due to nonsteroidal anti-inflammatory drug 12/12/2011   Anaphylactic reaction due to shellfish 09/27/2010   THYROMEGALY 09/03/2010   ALLERGIC RHINITIS 03/15/2010   MIGRAINE [name] 05/17/2008   PCP: Rennie Plowman FNP  REFERRING PROVIDER: Cassell Smiles MD  REFERRING DIAG: M16.12 (ICD-10-CM) - Unilateral primary osteoarthritis, left hip   Rationale for Evaluation and Treatment: Rehabilitation  THERAPY DIAG: Pain in left hip  Muscle weakness (generalized)  ONSET DATE: 09/02/2015 (approximate)  FOLLOW-UP APPT SCHEDULED WITH REFERRING PROVIDER: Yes    SUBJECTIVE:                                                                                                                                                                                         SUBJECTIVE STATEMENT: L anterior approach THA on 01/28/23  PERTINENT HISTORY:  Pt underwent L anterior approach THA on 01/28/23. She has struggled with pain control since surgery but otherwise no reported complications. She is using compressive icing 2x/day and performing ankle pumps.  History from 07/08/22 Pt reports a diagnosis of Lyme Disease in 2017 with persistent widespread chronic joint pain after treatment.  Of note she has severe L hip pain as well as pain in her L knee and right shoulder. Her L hip pain has been worsening recently. She denies any pain in her R hip or L shoulder. She was previously receiving L hip injections from Dr. Martha Clan however she reports that he refused to continue  with further injections so she was referred by pain management to Dr. Ashley Royalty. She saw Dr. Ashley Royalty and pt received a L hip intraarticular injection as well as an injection in her L SIJ and L greater trochanter. The injections have helped with  her pain. She was also referred to an orthopedic surgeon to discuss THR and referred for physical therapy. Pt expresses that she really enjoys exercising but is very limited by her pain.   PAIN:  Pain Intensity: Present: 8/10, Best: 7/10, Worst: 10/10 Pain location: Anterior L hip, L knee and into the calf; Pain Quality: constant  Radiating: Yes, down the posterior L thigh Numbness/Tingling: Yes, slight anterior/lateral hip numbness Focal Weakness: Yes, L hip weakness; Position of comfort: laying down (supine) Aggravating factors: extended standing, extended sitting, walking; Relieving factors: laying on back, body pillow, ice, pain medications; 24-hour pain behavior: worse at the end of the day History of prior back injury, pain, surgery, or therapy: Yes, history of chronic L hip pain; Dominant hand: right Imaging: Yes, IMPRESSION: 1. 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 2. Moderate pubic symphysis osteoarthritis. Red flags: Negative for bowel/bladder changes, saddle paresthesia, personal history of cancer, h/o spinal tumors, h/o compression fx, h/o abdominal aneurysm, abdominal pain, chills/fever, night sweats, nausea, vomiting, unrelenting pain,   PRECAUTIONS: Anterior hip  WEIGHT BEARING RESTRICTIONS: No  FALLS: Has patient fallen in last 6 months? No  Living Environment Lives with: lives with their spouse Lives in: House/apartment, one step to enter from the garage Stairs: Yes: Internal: 12 steps; on right going up Has following equipment at home: Single point cane, Walker - 2 wheeled, and Shower bench  Prior level of function: Independent  Occupational demands: works at [employer], stands for  8-10 hour shifts  Hobbies: Exercising;  Patient Goals: Return to exercising.   OBJECTIVE:   Patient Surveys  LEFS to be completed FOTO 1, predicted improvement to 59  Cognition Patient is oriented to person, place, and time.  Recent memory is intact.  Remote memory is intact.  Attention span and concentration are intact.  Expressive speech is intact.  Patient's fund of knowledge is within normal limits for educational level.    Gross Musculoskeletal Assessment Tremor: None Bulk: Normal Tone: Normal Area around bandage is dry without signs of erythema, edema, or ecchymosis. Dried blood on bandage but not excessive. Superior margin of bandage is peeling off and pt plans to go to ortho for a clean dressing after therapy session.  GAIT: Antalgic gait on LLE with L hip externally rotated during gait. Significantly decreased self-selected gait speed. Full gait assessment deferred  Posture: Lumbar lordosis: WNL Iliac crest height: Equal bilaterally Lumbar lateral shift: Negative  AROM AROM (Normal range in degrees) AROM   Hip Right Left  Flexion (125) WNL At least 90 degrees. Pain limited AROM for all AROM  Extension (15)    Abduction (40)    Adduction     Internal Rotation (45)    External Rotation (45)        Knee    Flexion (135) WNL WNL  Extension (0) 0 0  (* = pain; Blank rows = not tested)  LE MMT: MMT (out of 5) Right  Left   Hip flexion 5 <3  Hip extension    Hip abduction (seated) 4+ 3+*  Hip adduction 4+ 4*  Hip internal rotation    Hip external rotation    Knee flexion (seated) 4+ 4+  Knee extension 5 5  Ankle dorsiflexion 5 5  Ankle plantarflexion    Ankle inversion    Ankle eversion    (* = pain; Blank rows = not tested)  Sensation Intact and symmetrical to light touch throughout BLE L2-S2. Hot/cold and proprioception deferred;  Reflexes Deferred   Muscle Length Hamstrings: R: Positive for shortening around 70 degrees L: Positive for  shortening around 70 degrees [place] (quadriceps): R: Not examined L: Not examined Thomas (hip flexors): R: Not examined L: Not examined [place]: R: Not examined L: Not examined  Palpation Deferred  Passive Accessory Intervertebral Motion Deferred  Special Tests Deferred  Functional Tests TUG: 29.6s 5TSTS: 16.2s 35m gait speed: deferred;   TREATMENT  Ther-ex  Supine BLE ankle pumps x 10; Supine quad/glut sets with 3s hold x 10; Supine LLE heel slides x 10; Supine straight leg hip abduction x 10 BLE, with gentle assist; Supine straight leg hip adduction x 10 BLE, with gentle assist; Seated L LAQ x 10; HEP issued and reviewed with patient;   PATIENT EDUCATION:  Education details: Plan of care and examination findings, HEP Person educated: Patient and Spouse Education method: Programmer, multimedia, Facilities manager, Verbal cues, and Handouts Education comprehension: verbalized understanding, returned demonstration, and verbal cues required   HOME EXERCISE PROGRAM:  Access Code: [email or web address] URL: [email or web address] Date: 02/02/2023 Prepared by: Ria Comment  Exercises - Supine Ankle Pumps  - 2 x daily - 7 x weekly - 2 sets - 10 reps - Supine Quad Set  - 2 x daily - 7 x weekly - 2 sets - 10 reps - 3s hold - Supine Gluteal Sets  - 2 x daily - 7 x weekly - 2 sets - 10 reps - 3s hold - Supine Hip Abduction  - 2 x daily - 7 x weekly - 2 sets - 10 reps - Supine Heel Slide (Mirrored)  - 2 x daily - 7 x weekly - 2 sets - 10 reps - Seated Long Arc Quad (Mirrored)  - 2 x daily - 7 x weekly - 2 sets - 10 reps - 3s hold   ASSESSMENT:  CLINICAL IMPRESSION: Patient is a 57 y.o. female who was seen today for physical therapy evaluation and treatment for L hip pain s/p L anterior approach THA 01/28/23.   OBJECTIVE IMPAIRMENTS: Abnormal gait, difficulty walking, decreased ROM, decreased strength, and pain.   ACTIVITY LIMITATIONS: lifting, bending, sitting, standing, squatting,  sleeping, stairs, transfers, and bathing  PARTICIPATION LIMITATIONS: cleaning, shopping, community activity, and occupation  PERSONAL FACTORS: Past/current experiences, Profession, Time since onset of injury/illness/exacerbation, and 3+ comorbidities: depression, anxiety, migraines, chronic pain, and psoriatic arthritis  are also affecting patient's functional outcome.   REHAB POTENTIAL: Excellent  CLINICAL DECISION MAKING: Unstable/unpredictable  EVALUATION COMPLEXITY: High   GOALS: Goals reviewed with patient? No  SHORT TERM GOALS: Target date: 03/02/2023   Pt will be independent with HEP in order to improve strength and range of motion as well as decrease hip pain to improve function at home and work. Baseline:  Goal status: INITIAL   LONG TERM GOALS: Target date: 03/30/2023   Pt will increase FOTO to at least 39 to demonstrate significant improvement in function at home and work related to L hip pain  Baseline: 02/02/23: 1 Goal status: INITIAL  2.  Pt will decrease worst hip pain by at least 2 points on the NPRS in order to demonstrate clinically significant reduction in hip pain. Baseline: 02/02/23: worst: 10/10 Goal status: INITIAL  3.  Pt will decrease TUG to below 14 seconds in order to demonstrate decreased fall risk.      Baseline: 02/02/23: 29.6s; Goal status: INITIAL  4.  Pt will decrease 5TSTS by at least 3 seconds in order to demonstrate clinically significant improvement in LE strength  Baseline: 02/02/23: 16.2s; Goal status: INITIAL  5.  Pt will increase pain-free strength of L hip in all directions to at least 4/5 in order to demonstrate improvement in strength and function  Baseline: 02/02/23: see above; Goal status: INITIAL   PLAN: PT FREQUENCY: 1-2x/week  PT DURATION: 8 weeks  PLANNED INTERVENTIONS: Therapeutic exercises, Therapeutic activity, Neuromuscular re-education, Balance training, Gait training, Patient/Family education, Self Care, Joint  mobilization, Joint manipulation, Vestibular training, Canalith repositioning, Orthotic/Fit training, DME instructions, Dry Needling, Electrical stimulation, Spinal manipulation, Spinal mobilization, Cryotherapy, Moist heat, Taping, Traction, Ultrasound, Ionotophoresis 4mg /ml Dexamethasone, Manual therapy, and Re-evaluation.  PLAN FOR NEXT SESSION: measure hip ROM, additional strength testing as able, 77m gait speed, , progress strengthening, review/modify HEP as necessary;   Sharalyn Ink Frederica Chrestman PT, DPT, GCS  Kyson Kupper, PT 02/04/2023, 11:03 AM

## 2023-02-02 ENCOUNTER — Ambulatory Visit: Payer: Federal, State, Local not specified - PPO | Attending: Orthopedic Surgery

## 2023-02-02 ENCOUNTER — Ambulatory Visit: Payer: Federal, State, Local not specified - PPO

## 2023-02-02 DIAGNOSIS — M25552 Pain in left hip: Secondary | ICD-10-CM | POA: Insufficient documentation

## 2023-02-02 DIAGNOSIS — M6281 Muscle weakness (generalized): Secondary | ICD-10-CM | POA: Insufficient documentation

## 2023-02-03 ENCOUNTER — Telehealth: Payer: Self-pay | Admitting: Student in an Organized Health Care Education/Training Program

## 2023-02-03 NOTE — Telephone Encounter (Signed)
Called pharmacy. They have a Percocet script that could have been filled on 01/31/2023. Patient called and informed.

## 2023-02-03 NOTE — Telephone Encounter (Signed)
PT stated that she needed her oxycodone to be send in to the pharmacy for her refill. Please give patient a call once prescription has been send in. TY

## 2023-02-04 ENCOUNTER — Ambulatory Visit: Payer: Federal, State, Local not specified - PPO

## 2023-02-04 DIAGNOSIS — Z96649 Presence of unspecified artificial hip joint: Secondary | ICD-10-CM | POA: Insufficient documentation

## 2023-02-05 ENCOUNTER — Ambulatory Visit: Payer: Federal, State, Local not specified - PPO

## 2023-02-05 DIAGNOSIS — M25552 Pain in left hip: Secondary | ICD-10-CM | POA: Diagnosis not present

## 2023-02-05 DIAGNOSIS — M6281 Muscle weakness (generalized): Secondary | ICD-10-CM

## 2023-02-05 NOTE — Therapy (Signed)
OUTPATIENT PHYSICAL THERAPY HIP TREATMENT  Patient Name: DEISY OZBUN MRN: [id number] DOB:March 14, 1966, 57 y.o., female Today's Date: 02/06/2023   PT End of Session - 02/05/23 1527     Visit Number 2    Number of Visits 17    Date for PT Re-Evaluation 03/30/23    Authorization Type eval: 02/02/23    Authorization Time Period [employer] 2024 VL:75 combined visits per year    Authorization - Visit Number 15    Authorization - Number of Visits 75    PT Start Time 1530    PT Stop Time 1615    PT Time Calculation (min) 45 min    Equipment Utilized During Treatment Gait belt    Activity Tolerance Patient tolerated treatment well    Behavior During Therapy WFL for tasks assessed/performed            Past Medical History:  Diagnosis Date   Allergy    Anxiety    Chronic kidney disease    Chronic pain    Chronic sinusitis    COVID-19    09/14/20, 06/13/21   Depression    Heart murmur    Hiatal hernia    HPV (human papilloma virus) anogenital infection    Macromastia    Migraine    Psoriatic arthritis (HCC)    Shingles    UTI (urinary tract infection)    ecoli 09/2021   Past Surgical History:  Procedure Laterality Date   BREAST BIOPSY Right 01/28/2018   US guided biopsy - heart shaped   BREAST REDUCTION SURGERY Bilateral 04/03/2020   Procedure: MAMMARY REDUCTION  (BREAST);  Surgeon: Contogiannis, Chales Abrahams, MD;  Location: [facility];  Service: Plastics;  Laterality: Bilateral;  HAVE LIPOSUCTION MACHINE AVAILABLE   CHOLECYSTECTOMY  2009   COLONOSCOPY WITH PROPOFOL N/A 07/19/2018   Procedure: COLONOSCOPY WITH PROPOFOL;  Surgeon: Toney Reil, MD;  Location: [facility] ENDOSCOPY;  Service: Gastroenterology;  Laterality: N/A;   COLONOSCOPY WITH PROPOFOL N/A 09/17/2021   Procedure: COLONOSCOPY WITH PROPOFOL;  Surgeon: Toney Reil, MD;  Location: [facility] ENDOSCOPY;  Service: Gastroenterology;  Laterality: N/A;   COLONOSCOPY WITH PROPOFOL N/A 09/18/2021   Procedure:  COLONOSCOPY WITH PROPOFOL;  Surgeon: Toney Reil, MD;  Location: [facility] ENDOSCOPY;  Service: Gastroenterology;  Laterality: N/A;   ESOPHAGOGASTRODUODENOSCOPY (EGD) WITH PROPOFOL N/A 07/19/2018   Procedure: ESOPHAGOGASTRODUODENOSCOPY (EGD) WITH PROPOFOL;  Surgeon: Toney Reil, MD;  Location: [facility] ENDOSCOPY;  Service: Gastroenterology;  Laterality: N/A;   GASTRIC BYPASS     2015; duodenal switch    LAPAROSCOPIC GASTRIC BANDING  2008   removed 2009   REDUCTION MAMMAPLASTY     TUBAL LIGATION  1997   Patient Active Problem List   Diagnosis Date Noted   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) 10/21/2022   Lumbar radicular pain 10/01/2022   Fibromyalgia 10/01/2022   Normocytic anemia 06/13/2022   Elevated alkaline phosphatase level 05/28/2022   Chills 05/26/2022   Chronic left SI joint pain 11/13/2021   Piriformis syndrome, left 11/13/2021   Greater 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 11/13/2021   Primary osteoarthritis of right hip 09/16/2021   Plantar fasciitis 08/22/2021   Carpal tunnel syndrome on right 08/22/2021   Primary osteoarthritis of left hip 08/15/2021   Hyperphosphatemia 08/01/2021   Anemia in chronic kidney disease 04/18/2021   Chronic kidney disease, stage 3a (HCC) 04/18/2021   Hyperparathyroidism due to renal insufficiency (HCC) 04/18/2021   Hypertension 04/18/2021   Hiatal hernia 11/02/2020  History of migraine 11/02/2020   Gastroesophageal reflux disease 11/02/2020   Brain fog 11/02/2020   Depression, recurrent (HCC) 03/18/2020   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 09/08/2018   Arthropathy of right shoulder 09/08/2018   Controlled substance agreement signed 09/08/2018   Myofascial pain syndrome 08/24/2018   Routine physical examination 08/24/2018   Iron deficiency anemia 08/24/2018   Elevated liver enzymes 07/19/2018   Vitamin D deficiency 07/19/2018   Colon cancer screening    Abdominal pain, epigastric 07/08/2018    Right shoulder pain 06/15/2018   Hemorrhoids 05/01/2017   Anxiety and depression 11/24/2016   Lumbar pain with radiation down left leg 09/11/2016   Large breasts 09/11/2016   Parotiditis 05/01/2016   Abnormal laboratory test result 03/26/2016   Proteinuria 03/26/2016   Right hip pain 04/18/2015   Sinusitis 01/08/2014   Psoriatic arthritis (HCC) 09/13/2013   DUB (dysfunctional uterine bleeding) 10/27/2012   Gastritis due to nonsteroidal anti-inflammatory drug 12/12/2011   Anaphylactic reaction due to shellfish 09/27/2010   THYROMEGALY 09/03/2010   ALLERGIC RHINITIS 03/15/2010   MIGRAINE [name] 05/17/2008   PCP: Rennie Plowman FNP  REFERRING PROVIDER: Cassell Smiles MD  REFERRING DIAG: M16.12 (ICD-10-CM) - Unilateral primary osteoarthritis, left hip   Rationale for Evaluation and Treatment: Rehabilitation  THERAPY DIAG: Pain in left hip  Muscle weakness (generalized)  ONSET DATE: 09/02/2015 (approximate)  FOLLOW-UP APPT SCHEDULED WITH REFERRING PROVIDER: Yes   FROM INITIAL EVALUATION SUBJECTIVE:                                                                                                                                                                                         SUBJECTIVE STATEMENT: L anterior approach THA on 01/28/23  PERTINENT HISTORY:  Pt underwent L anterior approach THA on 01/28/23. She has struggled with pain control since surgery but otherwise no reported complications. She is using compressive icing 2x/day and performing ankle pumps.  History from 07/08/22 Pt reports a diagnosis of Lyme Disease in 2017 with persistent widespread chronic joint pain after treatment.  Of note she has severe L hip pain as well as pain in her L knee and right shoulder. Her L hip pain has been worsening recently. She denies any pain in her R hip or L shoulder. She was previously receiving L hip injections from Dr. Martha Clan however she reports that  he refused to continue with further injections so she was referred by pain management to Dr. Ashley Royalty. She saw Dr. Ashley Royalty and pt received a L hip intraarticular injection as well as an injection in her L SIJ and L greater trochanter. The injections have  helped with her pain. She was also referred to an orthopedic surgeon to discuss THR and referred for physical therapy. Pt expresses that she really enjoys exercising but is very limited by her pain.   PAIN:  Pain Intensity: Present: 8/10, Best: 7/10, Worst: 10/10 Pain location: Anterior L hip, L knee and into the calf; Pain Quality: constant  Radiating: Yes, down the posterior L thigh Numbness/Tingling: Yes, slight anterior/lateral hip numbness Focal Weakness: Yes, L hip weakness; Position of comfort: laying down (supine) Aggravating factors: extended standing, extended sitting, walking; Relieving factors: laying on back, body pillow, ice, pain medications; 24-hour pain behavior: worse at the end of the day History of prior back injury, pain, surgery, or therapy: Yes, history of chronic L hip pain; Dominant hand: right Imaging: Yes, IMPRESSION: 1. 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 2. Moderate pubic symphysis osteoarthritis. Red flags: Negative for bowel/bladder changes, saddle paresthesia, personal history of cancer, h/o spinal tumors, h/o compression fx, h/o abdominal aneurysm, abdominal pain, chills/fever, night sweats, nausea, vomiting, unrelenting pain,   PRECAUTIONS: Anterior hip  WEIGHT BEARING RESTRICTIONS: No  FALLS: Has patient fallen in last 6 months? No  Living Environment Lives with: lives with their spouse Lives in: House/apartment, one step to enter from the garage Stairs: Yes: Internal: 12 steps; on right going up Has following equipment at home: Single point cane, Walker - 2 wheeled, and Shower bench  Prior level of function: Independent  Occupational demands: works at [employer], stands for 8-10 hour shifts  Hobbies: Exercising;  Patient Goals: Return to exercising.   OBJECTIVE:   Patient Surveys  LEFS to be completed FOTO 1, predicted improvement to 36  Cognition Patient is oriented to person, place, and time.  Recent memory is intact.  Remote memory is intact.  Attention span and concentration are intact.  Expressive speech is intact.  Patient's fund of knowledge is within normal limits for educational level.    Gross Musculoskeletal Assessment Tremor: None Bulk: Normal Tone: Normal Area around bandage is dry without signs of erythema, edema, or ecchymosis. Dried blood on bandage but not excessive. Superior margin of bandage is peeling off and pt plans to go to ortho for a clean dressing after therapy session.  GAIT: Antalgic gait on LLE with L hip externally rotated during gait. Significantly decreased self-selected gait speed. Full gait assessment deferred  Posture: Lumbar lordosis: WNL Iliac crest height: Equal bilaterally Lumbar lateral shift: Negative  AROM AROM (Normal range in degrees) AROM   Hip Right Left  Flexion (125) WNL At least 90 degrees. Pain limited AROM for all AROM  Extension (15)    Abduction (40)    Adduction     Internal Rotation (45)    External Rotation (45)        Knee    Flexion (135) WNL WNL  Extension (0) 0 0  (* = pain; Blank rows = not tested)  LE MMT: MMT (out of 5) Right  Left   Hip flexion 5 <3  Hip extension    Hip abduction (seated) 4+ 3+*  Hip adduction 4+ 4*  Hip internal rotation    Hip external rotation    Knee flexion (seated) 4+ 4+  Knee extension 5 5  Ankle dorsiflexion 5 5  Ankle plantarflexion    Ankle inversion    Ankle eversion    (* = pain; Blank rows = not tested)  Sensation Intact and symmetrical to light touch throughout BLE L2-S2. Hot/cold and proprioception  deferred;  Reflexes Deferred   Muscle Length Hamstrings: R: Positive for shortening around 70  degrees L: Positive for shortening around 70 degrees [place] (quadriceps): R: Not examined L: Not examined Thomas (hip flexors): R: Not examined L: Not examined [place]: R: Not examined L: Not examined  Palpation Deferred  Passive Accessory Intervertebral Motion Deferred  Special Tests Deferred  Functional Tests TUG: 29.6s 5TSTS: 16.2s 2m gait speed: deferred;   TREATMENT   SUBJECTIVE: Pt reports that today is a "rough" day. She is having persistent L knee pain which she rates as 10/10. She is very concerned about the pain. She denies any L knee bruising, swelling, or warmth. Her L hip pain is gradually improving.   PAIN: 10/10 L knee pain, 4/10 L hip pain;   Ther-ex  Additional outcome measures: 23m gait speed: self-selected: 23.6s = 0.42 m/s : 234' with RW;  NuStep L0-1 x 5 minutes for warm-up; Supine BLE ankle pumps x 10; Supine quad/glut sets with 3s hold x 10; Supine LLE heel slides with resisted extension 2 x 10; Supine straight leg hip abduction 2 x 10 BLE, with gentle assist; Supine straight leg hip adduction 2 x 10 BLE, with gentle assist; Standing mini squats x 10; Standing heel raises x 15; Side stepping in // bars with BUE support x multiple lengths; Reinforced HEP;   PATIENT EDUCATION:  Education details: Pt educated throughout session about proper posture and technique with exercises. Improved exercise technique, movement at target joints, use of target muscles after min to mod verbal, visual, tactile cues.  Person educated: Patient and Spouse Education method: Explanation, Demonstration, Verbal cues, and Handouts Education comprehension: verbalized understanding, returned demonstration, and verbal cues required   HOME EXERCISE PROGRAM:  Access Code: [email or web address] URL: [email or web address] Date: 02/02/2023 Prepared by: Ria Comment  Exercises - Supine Ankle Pumps  - 2 x daily - 7 x weekly - 2 sets - 10 reps - Supine Quad Set  - 2 x  daily - 7 x weekly - 2 sets - 10 reps - 3s hold - Supine Gluteal Sets  - 2 x daily - 7 x weekly - 2 sets - 10 reps - 3s hold - Supine Hip Abduction  - 2 x daily - 7 x weekly - 2 sets - 10 reps - Supine Heel Slide (Mirrored)  - 2 x daily - 7 x weekly - 2 sets - 10 reps - Seated Long Arc Quad (Mirrored)  - 2 x daily - 7 x weekly - 2 sets - 10 reps - 3s hold   ASSESSMENT:  CLINICAL IMPRESSION: Updated additional outcome measures with patient. Her self-selected 25m gait speed of 0.42 m/s is below normative values for full household and community ambulation. Overall her gait has improved since the initial evaluation. Two Minute Walk Test distance today is 39' with a rolling walker. Progressed strengthening with patient during session today and her L hip pain is not limiting for exercise. However her L knee remains very painful but pt is able to bear weight and ambulate. No ecchymosis, edema, or erythema noted in R knee. Only mildly painful to palpation. Pt denies any locking of L knee and pain is constant and not specifically with WB. Discussed possible post-surgical causes for her pain and encouraged her to follow-up with MD at surgical follow-up or sooner if pain becomes limiting. Pt encouraged to continue HEP and follow-up as scheduled. she will benefit from PT services to address deficits in strength, mobility, and pain in order to  return to full function at home.    OBJECTIVE IMPAIRMENTS: Abnormal gait, difficulty walking, decreased ROM, decreased strength, and pain.   ACTIVITY LIMITATIONS: lifting, bending, sitting, standing, squatting, sleeping, stairs, transfers, and bathing  PARTICIPATION LIMITATIONS: cleaning, shopping, community activity, and occupation  PERSONAL FACTORS: Past/current experiences, Profession, Time since onset of injury/illness/exacerbation, and 3+ comorbidities: depression, anxiety, migraines, chronic pain, and psoriatic arthritis  are also affecting patient's functional  outcome.   REHAB POTENTIAL: Excellent  CLINICAL DECISION MAKING: Unstable/unpredictable  EVALUATION COMPLEXITY: High   GOALS: Goals reviewed with patient? No  SHORT TERM GOALS: Target date: 03/02/2023   Pt will be independent with HEP in order to improve strength and range of motion as well as decrease hip pain to improve function at home and work. Baseline:  Goal status: INITIAL   LONG TERM GOALS: Target date: 03/30/2023   Pt will increase FOTO to at least 39 to demonstrate significant improvement in function at home and work related to L hip pain  Baseline: 02/02/23: 1 Goal status: INITIAL  2.  Pt will decrease worst hip pain by at least 2 points on the NPRS in order to demonstrate clinically significant reduction in hip pain. Baseline: 02/02/23: worst: 10/10 Goal status: INITIAL  3.  Pt will decrease TUG to below 14 seconds in order to demonstrate decreased fall risk.      Baseline: 02/02/23: 29.6s; Goal status: INITIAL  4.  Pt will decrease 5TSTS by at least 3 seconds in order to demonstrate clinically significant improvement in LE strength       Baseline: 02/02/23: 16.2s; Goal status: INITIAL  5.  Pt will increase pain-free strength of L hip in all directions to at least 4/5 in order to demonstrate improvement in strength and function  Baseline: 02/02/23: see above; Goal status: INITIAL  6. Pt will increase by at least 112.5' in order to demonstrate clinically significant improvement in cardiopulmonary endurance and community ambulation  Baseline: 02/05/23: 61' with RW; Goal status: INITIAL   PLAN: PT FREQUENCY: 1-2x/week  PT DURATION: 8 weeks  PLANNED INTERVENTIONS: Therapeutic exercises, Therapeutic activity, Neuromuscular re-education, Balance training, Gait training, Patient/Family education, Self Care, Joint mobilization, Joint manipulation, Vestibular training, Canalith repositioning, Orthotic/Fit training, DME instructions, Dry Needling, Electrical stimulation,  Spinal manipulation, Spinal mobilization, Cryotherapy, Moist heat, Taping, Traction, Ultrasound, Ionotophoresis 4mg /ml Dexamethasone, Manual therapy, and Re-evaluation.  PLAN FOR NEXT SESSION: measure hip ROM, additional strength testing as able, progress strengthening, review/modify HEP as necessary;   Sharalyn Ink Finley Chevez PT, DPT, GCS  Aunika Kirsten, PT 02/06/2023, 8:33 AM

## 2023-02-06 NOTE — Therapy (Signed)
OUTPATIENT PHYSICAL THERAPY HIP TREATMENT  Patient Name: Barbara Faulkner MRN: [id number] DOB:1965-10-29, 57 y.o., female Today's Date: 02/06/2023    Past Medical History:  Diagnosis Date   Allergy    Anxiety    Chronic kidney disease    Chronic pain    Chronic sinusitis    COVID-19    09/14/20, 06/13/21   Depression    Heart murmur    Hiatal hernia    HPV (human papilloma virus) anogenital infection    Macromastia    Migraine    Psoriatic arthritis (HCC)    Shingles    UTI (urinary tract infection)    ecoli 09/2021   Past Surgical History:  Procedure Laterality Date   BREAST BIOPSY Right 01/28/2018   US guided biopsy - heart shaped   BREAST REDUCTION SURGERY Bilateral 04/03/2020   Procedure: MAMMARY REDUCTION  (BREAST);  Surgeon: Contogiannis, Chales Abrahams, MD;  Location: [facility];  Service: Plastics;  Laterality: Bilateral;  HAVE LIPOSUCTION MACHINE AVAILABLE   CHOLECYSTECTOMY  2009   COLONOSCOPY WITH PROPOFOL N/A 07/19/2018   Procedure: COLONOSCOPY WITH PROPOFOL;  Surgeon: Toney Reil, MD;  Location: [facility] ENDOSCOPY;  Service: Gastroenterology;  Laterality: N/A;   COLONOSCOPY WITH PROPOFOL N/A 09/17/2021   Procedure: COLONOSCOPY WITH PROPOFOL;  Surgeon: Toney Reil, MD;  Location: [facility] ENDOSCOPY;  Service: Gastroenterology;  Laterality: N/A;   COLONOSCOPY WITH PROPOFOL N/A 09/18/2021   Procedure: COLONOSCOPY WITH PROPOFOL;  Surgeon: Toney Reil, MD;  Location: [facility] ENDOSCOPY;  Service: Gastroenterology;  Laterality: N/A;   ESOPHAGOGASTRODUODENOSCOPY (EGD) WITH PROPOFOL N/A 07/19/2018   Procedure: ESOPHAGOGASTRODUODENOSCOPY (EGD) WITH PROPOFOL;  Surgeon: Toney Reil, MD;  Location: [facility] ENDOSCOPY;  Service: Gastroenterology;  Laterality: N/A;   GASTRIC BYPASS     2015; duodenal switch    LAPAROSCOPIC GASTRIC BANDING  2008   removed 2009   REDUCTION MAMMAPLASTY     TUBAL LIGATION  1997   Patient Active Problem List   Diagnosis  Date Noted   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) 10/21/2022   Lumbar radicular pain 10/01/2022   Fibromyalgia 10/01/2022   Normocytic anemia 06/13/2022   Elevated alkaline phosphatase level 05/28/2022   Chills 05/26/2022   Chronic left SI joint pain 11/13/2021   Piriformis syndrome, left 11/13/2021   Greater 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 11/13/2021   Primary osteoarthritis of right hip 09/16/2021   Plantar fasciitis 08/22/2021   Carpal tunnel syndrome on right 08/22/2021   Primary osteoarthritis of left hip 08/15/2021   Hyperphosphatemia 08/01/2021   Anemia in chronic kidney disease 04/18/2021   Chronic kidney disease, stage 3a (HCC) 04/18/2021   Hyperparathyroidism due to renal insufficiency (HCC) 04/18/2021   Hypertension 04/18/2021   Hiatal hernia 11/02/2020   History of migraine 11/02/2020   Gastroesophageal reflux disease 11/02/2020   Brain fog 11/02/2020   Depression, recurrent (HCC) 03/18/2020   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 09/08/2018   Arthropathy of right shoulder 09/08/2018   Controlled substance agreement signed 09/08/2018   Myofascial pain syndrome 08/24/2018   Routine physical examination 08/24/2018   Iron deficiency anemia 08/24/2018   Elevated liver enzymes 07/19/2018   Vitamin D deficiency 07/19/2018   Colon cancer screening    Abdominal pain, epigastric 07/08/2018   Right shoulder pain 06/15/2018   Hemorrhoids 05/01/2017   Anxiety and depression 11/24/2016   Lumbar pain with radiation down left leg 09/11/2016   Large breasts 09/11/2016   Parotiditis 05/01/2016   Abnormal laboratory  test result 03/26/2016   Proteinuria 03/26/2016   Right hip pain 04/18/2015   Sinusitis 01/08/2014   Psoriatic arthritis (HCC) 09/13/2013   DUB (dysfunctional uterine bleeding) 10/27/2012   Gastritis due to nonsteroidal anti-inflammatory drug 12/12/2011   Anaphylactic reaction due to shellfish 09/27/2010   THYROMEGALY 09/03/2010    ALLERGIC RHINITIS 03/15/2010   MIGRAINE [name] 05/17/2008   PCP: Rennie Plowman FNP  REFERRING PROVIDER: Cassell Smiles MD  REFERRING DIAG: [phone] (ICD-10-CM) - Unilateral primary osteoarthritis, left hip   Rationale for Evaluation and Treatment: Rehabilitation  THERAPY DIAG: Pain in left hip  Muscle weakness (generalized)  ONSET DATE: 09/02/2015 (approximate)  FOLLOW-UP APPT SCHEDULED WITH REFERRING PROVIDER: Yes   FROM INITIAL EVALUATION SUBJECTIVE:                                                                                                                                                                                         SUBJECTIVE STATEMENT: L anterior approach THA on 01/28/23  PERTINENT HISTORY:  Pt underwent L anterior approach THA on 01/28/23. She has struggled with pain control since surgery but otherwise no reported complications. She is using compressive icing 2x/day and performing ankle pumps.  History from 07/08/22 Pt reports a diagnosis of Lyme Disease in 2017 with persistent widespread chronic joint pain after treatment.  Of note she has severe L hip pain as well as pain in her L knee and right shoulder. Her L hip pain has been worsening recently. She denies any pain in her R hip or L shoulder. She was previously receiving L hip injections from Dr. Martha Clan however she reports that he refused to continue with further injections so she was referred by pain management to Dr. Ashley Royalty. She saw Dr. Ashley Royalty and pt received a L hip intraarticular injection as well as an injection in her L SIJ and L greater trochanter. The injections have helped with her pain. She was also referred to an orthopedic surgeon to discuss THR and referred for physical therapy. Pt expresses that she really enjoys exercising but is very limited by her pain.   PAIN:  Pain Intensity: Present: 8/10, Best: 7/10, Worst: 10/10 Pain location: Anterior L hip, L knee and  into the calf; Pain Quality: constant  Radiating: Yes, down the posterior L thigh Numbness/Tingling: Yes, slight anterior/lateral hip numbness Focal Weakness: Yes, L hip weakness; Position of comfort: laying down (supine) Aggravating factors: extended standing, extended sitting, walking; Relieving factors: laying on back, body pillow, ice, pain medications; 24-hour pain behavior: worse at the end of the day History of prior back injury, pain, surgery, or therapy: Yes, history of  chronic L hip pain; Dominant hand: right Imaging: Yes, IMPRESSION: 1. 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 2. Moderate pubic symphysis osteoarthritis. Red flags: Negative for bowel/bladder changes, saddle paresthesia, personal history of cancer, h/o spinal tumors, h/o compression fx, h/o abdominal aneurysm, abdominal pain, chills/fever, night sweats, nausea, vomiting, unrelenting pain,   PRECAUTIONS: Anterior hip  WEIGHT BEARING RESTRICTIONS: No  FALLS: Has patient fallen in last 6 months? No  Living Environment Lives with: lives with their spouse Lives in: House/apartment, one step to enter from the garage Stairs: Yes: Internal: 12 steps; on right going up Has following equipment at home: Single point cane, Walker - 2 wheeled, and Shower bench  Prior level of function: Independent  Occupational demands: works at [employer], stands for 8-10 hour shifts  Hobbies: Exercising;  Patient Goals: Return to exercising.   OBJECTIVE:   Patient Surveys  LEFS to be completed FOTO 1, predicted improvement to 21  Cognition Patient is oriented to person, place, and time.  Recent memory is intact.  Remote memory is intact.  Attention span and concentration are intact.  Expressive speech is intact.  Patient's fund of knowledge is within normal limits for educational level.    Gross Musculoskeletal Assessment Tremor: None Bulk: Normal Tone: Normal Area around  bandage is dry without signs of erythema, edema, or ecchymosis. Dried blood on bandage but not excessive. Superior margin of bandage is peeling off and pt plans to go to ortho for a clean dressing after therapy session.  GAIT: Antalgic gait on LLE with L hip externally rotated during gait. Significantly decreased self-selected gait speed. Full gait assessment deferred  Posture: Lumbar lordosis: WNL Iliac crest height: Equal bilaterally Lumbar lateral shift: Negative  AROM AROM (Normal range in degrees) AROM   Hip Right Left  Flexion (125) WNL At least 90 degrees. Pain limited AROM for all AROM  Extension (15)    Abduction (40)    Adduction     Internal Rotation (45)    External Rotation (45)        Knee    Flexion (135) WNL WNL  Extension (0) 0 0  (* = pain; Blank rows = not tested)  LE MMT: MMT (out of 5) Right  Left   Hip flexion 5 <3  Hip extension    Hip abduction (seated) 4+ 3+*  Hip adduction 4+ 4*  Hip internal rotation    Hip external rotation    Knee flexion (seated) 4+ 4+  Knee extension 5 5  Ankle dorsiflexion 5 5  Ankle plantarflexion    Ankle inversion    Ankle eversion    (* = pain; Blank rows = not tested)  Sensation Intact and symmetrical to light touch throughout BLE L2-S2. Hot/cold and proprioception deferred;  Reflexes Deferred   Muscle Length Hamstrings: R: Positive for shortening around 70 degrees L: Positive for shortening around 70 degrees [place] (quadriceps): R: Not examined L: Not examined Thomas (hip flexors): R: Not examined L: Not examined Ober: R: Not examined L: Not examined  Palpation Deferred  Passive Accessory Intervertebral Motion Deferred  Special Tests Deferred  Functional Tests TUG: 29.6s 5TSTS: 16.2s 45m gait speed: deferred;   TREATMENT   SUBJECTIVE: Pt reports that today is a "rough" day. She is having persistent L knee pain which she rates as 10/10. She is very concerned about the pain. She denies any L  knee bruising, swelling, or warmth. Her L hip pain is gradually improving.   PAIN: 10/10  L knee pain, 4/10 L hip pain;   Ther-ex  Additional outcome measures: 71m gait speed: self-selected: 23.6s = 0.42 m/s : 234' with RW;  NuStep L0-1 x 5 minutes for warm-up; Supine BLE ankle pumps x 10; Supine quad/glut sets with 3s hold x 10; Supine LLE heel slides with resisted extension 2 x 10; Supine straight leg hip abduction 2 x 10 BLE, with gentle assist; Supine straight leg hip adduction 2 x 10 BLE, with gentle assist; Standing mini squats x 10; Standing heel raises x 15; Side stepping in // bars with BUE support x multiple lengths; Reinforced HEP;   PATIENT EDUCATION:  Education details: Pt educated throughout session about proper posture and technique with exercises. Improved exercise technique, movement at target joints, use of target muscles after min to mod verbal, visual, tactile cues.  Person educated: Patient and Spouse Education method: Explanation, Demonstration, Verbal cues, and Handouts Education comprehension: verbalized understanding, returned demonstration, and verbal cues required   HOME EXERCISE PROGRAM:  Access Code: [email or web address] URL: [email or web address] Date: 02/02/2023 Prepared by: Ria Comment  Exercises - Supine Ankle Pumps  - 2 x daily - 7 x weekly - 2 sets - 10 reps - Supine Quad Set  - 2 x daily - 7 x weekly - 2 sets - 10 reps - 3s hold - Supine Gluteal Sets  - 2 x daily - 7 x weekly - 2 sets - 10 reps - 3s hold - Supine Hip Abduction  - 2 x daily - 7 x weekly - 2 sets - 10 reps - Supine Heel Slide (Mirrored)  - 2 x daily - 7 x weekly - 2 sets - 10 reps - Seated Long Arc Quad (Mirrored)  - 2 x daily - 7 x weekly - 2 sets - 10 reps - 3s hold   ASSESSMENT:  CLINICAL IMPRESSION: Updated additional outcome measures with patient. Her self-selected 8m gait speed of 0.42 m/s is below normative values for full household and community  ambulation. Overall her gait has improved since the initial evaluation. Two Minute Walk Test distance today is 66' with a rolling walker. Progressed strengthening with patient during session today and her L hip pain is not limiting for exercise. However her L knee remains very painful but pt is able to bear weight and ambulate. No ecchymosis, edema, or erythema noted in R knee. Only mildly painful to palpation. Pt denies any locking of L knee and pain is constant and not specifically with WB. Discussed possible post-surgical causes for her pain and encouraged her to follow-up with MD at surgical follow-up or sooner if pain becomes limiting. Pt encouraged to continue HEP and follow-up as scheduled. she will benefit from PT services to address deficits in strength, mobility, and pain in order to return to full function at home.    OBJECTIVE IMPAIRMENTS: Abnormal gait, difficulty walking, decreased ROM, decreased strength, and pain.   ACTIVITY LIMITATIONS: lifting, bending, sitting, standing, squatting, sleeping, stairs, transfers, and bathing  PARTICIPATION LIMITATIONS: cleaning, shopping, community activity, and occupation  PERSONAL FACTORS: Past/current experiences, Profession, Time since onset of injury/illness/exacerbation, and 3+ comorbidities: depression, anxiety, migraines, chronic pain, and psoriatic arthritis  are also affecting patient's functional outcome.   REHAB POTENTIAL: Excellent  CLINICAL DECISION MAKING: Unstable/unpredictable  EVALUATION COMPLEXITY: High   GOALS: Goals reviewed with patient? No  SHORT TERM GOALS: Target date: 03/02/2023   Pt will be independent with HEP in order to improve strength and range of motion as well as decrease hip pain  to improve function at home and work. Baseline:  Goal status: INITIAL   LONG TERM GOALS: Target date: 03/30/2023   Pt will increase FOTO to at least 39 to demonstrate significant improvement in function at home and work related  to L hip pain  Baseline: 02/02/23: 1 Goal status: INITIAL  2.  Pt will decrease worst hip pain by at least 2 points on the NPRS in order to demonstrate clinically significant reduction in hip pain. Baseline: 02/02/23: worst: 10/10 Goal status: INITIAL  3.  Pt will decrease TUG to below 14 seconds in order to demonstrate decreased fall risk.      Baseline: 02/02/23: 29.6s; Goal status: INITIAL  4.  Pt will decrease 5TSTS by at least 3 seconds in order to demonstrate clinically significant improvement in LE strength       Baseline: 02/02/23: 16.2s; Goal status: INITIAL  5.  Pt will increase pain-free strength of L hip in all directions to at least 4/5 in order to demonstrate improvement in strength and function  Baseline: 02/02/23: see above; Goal status: INITIAL  6. Pt will increase by at least 112.5' in order to demonstrate clinically significant improvement in cardiopulmonary endurance and community ambulation  Baseline: 02/05/23: 36' with RW; Goal status: INITIAL   PLAN: PT FREQUENCY: 1-2x/week  PT DURATION: 8 weeks  PLANNED INTERVENTIONS: Therapeutic exercises, Therapeutic activity, Neuromuscular re-education, Balance training, Gait training, Patient/Family education, Self Care, Joint mobilization, Joint manipulation, Vestibular training, Canalith repositioning, Orthotic/Fit training, DME instructions, Dry Needling, Electrical stimulation, Spinal manipulation, Spinal mobilization, Cryotherapy, Moist heat, Taping, Traction, Ultrasound, Ionotophoresis 4mg /ml Dexamethasone, Manual therapy, and Re-evaluation.  PLAN FOR NEXT SESSION: measure hip ROM, additional strength testing as able, progress strengthening, review/modify HEP as necessary;   Sharalyn Ink Tenleigh Byer PT, DPT, GCS  Beryle Bagsby, PT 02/06/2023, 9:08 AM

## 2023-02-09 ENCOUNTER — Ambulatory Visit: Payer: Federal, State, Local not specified - PPO

## 2023-02-09 DIAGNOSIS — M25552 Pain in left hip: Secondary | ICD-10-CM

## 2023-02-09 DIAGNOSIS — M6281 Muscle weakness (generalized): Secondary | ICD-10-CM

## 2023-02-10 DIAGNOSIS — H18213 Corneal edema secondary to contact lens, bilateral: Secondary | ICD-10-CM | POA: Diagnosis not present

## 2023-02-10 DIAGNOSIS — H16143 Punctate keratitis, bilateral: Secondary | ICD-10-CM | POA: Diagnosis not present

## 2023-02-10 DIAGNOSIS — H18823 Corneal disorder due to contact lens, bilateral: Secondary | ICD-10-CM | POA: Diagnosis not present

## 2023-02-10 NOTE — Therapy (Signed)
OUTPATIENT PHYSICAL THERAPY HIP TREATMENT  Patient Name: Barbara Faulkner MRN: [id number] DOB:1965/09/11, 57 y.o., female Today's Date: 02/11/2023   PT End of Session - 02/11/23 1021     Visit Number 4    Number of Visits 17    Date for PT Re-Evaluation 03/30/23    Authorization Type eval: 02/02/23    Authorization Time Period [employer] 2024 VL:75 combined visits per year    Authorization - Visit Number 17    Authorization - Number of Visits 75    PT Start Time 1019    PT Stop Time 1100    PT Time Calculation (min) 41 min    Equipment Utilized During Treatment Gait belt    Activity Tolerance Patient tolerated treatment well    Behavior During Therapy WFL for tasks assessed/performed            Past Medical History:  Diagnosis Date   Allergy    Anxiety    Chronic kidney disease    Chronic pain    Chronic sinusitis    COVID-19    09/14/20, 06/13/21   Depression    Heart murmur    Hiatal hernia    HPV (human papilloma virus) anogenital infection    Macromastia    Migraine    Psoriatic arthritis (HCC)    Shingles    UTI (urinary tract infection)    ecoli 09/2021   Past Surgical History:  Procedure Laterality Date   BREAST BIOPSY Right 01/28/2018   US guided biopsy - heart shaped   BREAST REDUCTION SURGERY Bilateral 04/03/2020   Procedure: MAMMARY REDUCTION  (BREAST);  Surgeon: Contogiannis, Chales Abrahams, MD;  Location: [facility];  Service: Plastics;  Laterality: Bilateral;  HAVE LIPOSUCTION MACHINE AVAILABLE   CHOLECYSTECTOMY  2009   COLONOSCOPY WITH PROPOFOL N/A 07/19/2018   Procedure: COLONOSCOPY WITH PROPOFOL;  Surgeon: Toney Reil, MD;  Location: [facility] ENDOSCOPY;  Service: Gastroenterology;  Laterality: N/A;   COLONOSCOPY WITH PROPOFOL N/A 09/17/2021   Procedure: COLONOSCOPY WITH PROPOFOL;  Surgeon: Toney Reil, MD;  Location: [facility] ENDOSCOPY;  Service: Gastroenterology;  Laterality: N/A;   COLONOSCOPY WITH PROPOFOL N/A 09/18/2021   Procedure:  COLONOSCOPY WITH PROPOFOL;  Surgeon: Toney Reil, MD;  Location: [facility] ENDOSCOPY;  Service: Gastroenterology;  Laterality: N/A;   ESOPHAGOGASTRODUODENOSCOPY (EGD) WITH PROPOFOL N/A 07/19/2018   Procedure: ESOPHAGOGASTRODUODENOSCOPY (EGD) WITH PROPOFOL;  Surgeon: Toney Reil, MD;  Location: [facility] ENDOSCOPY;  Service: Gastroenterology;  Laterality: N/A;   GASTRIC BYPASS     2015; duodenal switch    LAPAROSCOPIC GASTRIC BANDING  2008   removed 2009   REDUCTION MAMMAPLASTY     TUBAL LIGATION  1997   Patient Active Problem List   Diagnosis Date Noted   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) 10/21/2022   Lumbar radicular pain 10/01/2022   Fibromyalgia 10/01/2022   Normocytic anemia 06/13/2022   Elevated alkaline phosphatase level 05/28/2022   Chills 05/26/2022   Chronic left SI joint pain 11/13/2021   Piriformis syndrome, left 11/13/2021   Greater 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 11/13/2021   Primary osteoarthritis of right hip 09/16/2021   Plantar fasciitis 08/22/2021   Carpal tunnel syndrome on right 08/22/2021   Primary osteoarthritis of left hip 08/15/2021   Hyperphosphatemia 08/01/2021   Anemia in chronic kidney disease 04/18/2021   Chronic kidney disease, stage 3a (HCC) 04/18/2021   Hyperparathyroidism due to renal insufficiency (HCC) 04/18/2021   Hypertension 04/18/2021   Hiatal hernia 11/02/2020  History of migraine 11/02/2020   Gastroesophageal reflux disease 11/02/2020   Brain fog 11/02/2020   Depression, recurrent (HCC) 03/18/2020   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 09/08/2018   Arthropathy of right shoulder 09/08/2018   Controlled substance agreement signed 09/08/2018   Myofascial pain syndrome 08/24/2018   Routine physical examination 08/24/2018   Iron deficiency anemia 08/24/2018   Elevated liver enzymes 07/19/2018   Vitamin D deficiency 07/19/2018   Colon cancer screening    Abdominal pain, epigastric 07/08/2018    Right shoulder pain 06/15/2018   Hemorrhoids 05/01/2017   Anxiety and depression 11/24/2016   Lumbar pain with radiation down left leg 09/11/2016   Large breasts 09/11/2016   Parotiditis 05/01/2016   Abnormal laboratory test result 03/26/2016   Proteinuria 03/26/2016   Right hip pain 04/18/2015   Sinusitis 01/08/2014   Psoriatic arthritis (HCC) 09/13/2013   DUB (dysfunctional uterine bleeding) 10/27/2012   Gastritis due to nonsteroidal anti-inflammatory drug 12/12/2011   Anaphylactic reaction due to shellfish 09/27/2010   THYROMEGALY 09/03/2010   ALLERGIC RHINITIS 03/15/2010   MIGRAINE [name] 05/17/2008   PCP: Rennie Plowman FNP  REFERRING PROVIDER: Cassell Smiles MD  REFERRING DIAG: M16.12 (ICD-10-CM) - Unilateral primary osteoarthritis, left hip   Rationale for Evaluation and Treatment: Rehabilitation  THERAPY DIAG: Pain in left hip  Muscle weakness (generalized)  ONSET DATE: 09/02/2015 (approximate)  FOLLOW-UP APPT SCHEDULED WITH REFERRING PROVIDER: Yes   FROM INITIAL EVALUATION SUBJECTIVE:                                                                                                                                                                                         SUBJECTIVE STATEMENT: L anterior approach THA on 01/28/23  PERTINENT HISTORY:  Pt underwent L anterior approach THA on 01/28/23. She has struggled with pain control since surgery but otherwise no reported complications. She is using compressive icing 2x/day and performing ankle pumps.  History from 07/08/22 Pt reports a diagnosis of Lyme Disease in 2017 with persistent widespread chronic joint pain after treatment.  Of note she has severe L hip pain as well as pain in her L knee and right shoulder. Her L hip pain has been worsening recently. She denies any pain in her R hip or L shoulder. She was previously receiving L hip injections from Dr. Martha Clan however she reports that  he refused to continue with further injections so she was referred by pain management to Dr. Ashley Royalty. She saw Dr. Ashley Royalty and pt received a L hip intraarticular injection as well as an injection in her L SIJ and L greater trochanter. The injections have  helped with her pain. She was also referred to an orthopedic surgeon to discuss THR and referred for physical therapy. Pt expresses that she really enjoys exercising but is very limited by her pain.   PAIN:  Pain Intensity: Present: 8/10, Best: 7/10, Worst: 10/10 Pain location: Anterior L hip, L knee and into the calf; Pain Quality: constant  Radiating: Yes, down the posterior L thigh Numbness/Tingling: Yes, slight anterior/lateral hip numbness Focal Weakness: Yes, L hip weakness; Position of comfort: laying down (supine) Aggravating factors: extended standing, extended sitting, walking; Relieving factors: laying on back, body pillow, ice, pain medications; 24-hour pain behavior: worse at the end of the day History of prior back injury, pain, surgery, or therapy: Yes, history of chronic L hip pain; Dominant hand: right Imaging: Yes, IMPRESSION: 1. 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 2. Moderate pubic symphysis osteoarthritis. Red flags: Negative for bowel/bladder changes, saddle paresthesia, personal history of cancer, h/o spinal tumors, h/o compression fx, h/o abdominal aneurysm, abdominal pain, chills/fever, night sweats, nausea, vomiting, unrelenting pain,   PRECAUTIONS: Anterior hip  WEIGHT BEARING RESTRICTIONS: No  FALLS: Has patient fallen in last 6 months? No  Living Environment Lives with: lives with their spouse Lives in: House/apartment, one step to enter from the garage Stairs: Yes: Internal: 12 steps; on right going up Has following equipment at home: Single point cane, Walker - 2 wheeled, and Shower bench  Prior level of function: Independent  Occupational demands: works at [employer], stands for 8-10 hour shifts  Hobbies: Exercising;  Patient Goals: Return to exercising.   OBJECTIVE:   Patient Surveys  LEFS to be completed FOTO 1, predicted improvement to 36  Cognition Patient is oriented to person, place, and time.  Recent memory is intact.  Remote memory is intact.  Attention span and concentration are intact.  Expressive speech is intact.  Patient's fund of knowledge is within normal limits for educational level.    Gross Musculoskeletal Assessment Tremor: None Bulk: Normal Tone: Normal Area around bandage is dry without signs of erythema, edema, or ecchymosis. Dried blood on bandage but not excessive. Superior margin of bandage is peeling off and pt plans to go to ortho for a clean dressing after therapy session.  GAIT: Antalgic gait on LLE with L hip externally rotated during gait. Significantly decreased self-selected gait speed. Full gait assessment deferred  Posture: Lumbar lordosis: WNL Iliac crest height: Equal bilaterally Lumbar lateral shift: Negative  AROM AROM (Normal range in degrees) AROM   Hip Right Left  Flexion (125) WNL At least 90 degrees. Pain limited AROM for all AROM  Extension (15)    Abduction (40)    Adduction     Internal Rotation (45)    External Rotation (45)        Knee    Flexion (135) WNL WNL  Extension (0) 0 0  (* = pain; Blank rows = not tested)  LE MMT: MMT (out of 5) Right  Left   Hip flexion 5 <3  Hip extension    Hip abduction (seated) 4+ 3+*  Hip adduction 4+ 4*  Hip internal rotation    Hip external rotation    Knee flexion (seated) 4+ 4+  Knee extension 5 5  Ankle dorsiflexion 5 5  Ankle plantarflexion    Ankle inversion    Ankle eversion    (* = pain; Blank rows = not tested)  Sensation Intact and symmetrical to light touch throughout BLE L2-S2. Hot/cold and proprioception  deferred;  Muscle Length Hamstrings: R: Positive for shortening around 70 degrees L: Positive for  shortening around 70 degrees [place] (quadriceps): R: Not examined L: Not examined [place] (hip flexors): R: Not examined L: Not examined [place]: R: Not examined L: Not examined  Functional Tests TUG: 29.6s 5TSTS: 16.2s   TREATMENT   SUBJECTIVE: Pt reports that she is doing well today.  Her left knee pain has been improving. She reports some swelling in her L hip but no warmth, redness, or discharge from the incision. She has an appointment at 2:00 for staple removal. Her L hip pain is also gradually improving.  She is performing HEP without issue.  She continues ambulating with a single-point cane and has been ambulating some around the house without an assistive device.  No specific questions or concerns.   PAIN: L hip pain;   Ther-ex  NuStep L0-4 x 10:00 minutes for warm-up;  Standing exercises with 2# ankle weights (AW): Hip flexion marches x 10 BLE; Standing hip abduction x 10 BLE; Hamstring curls x 10 BLE;  Standing mini squats x 10; Standing heel raises x 20; Seated LAQ with 2# AW x 10 BLE; Seated clams with manual resistance 2 x 10 BLE; Seated adductor squeeze with manual resistance 2 x 10 BLE; Side stepping with 2# AW x multiple laps; Mini squats  with mirror for even weight distribution 2 x 10; Supine LLE heel slides with resisted extension 2 x 10; Hooklying clams with manual resistance x 10; Hooklying adductor squeeze with manual resistance x 10; Hooklying bridges 2 x 10;    PATIENT EDUCATION:  Education details: Pt educated throughout session about proper posture and technique with exercises. Improved exercise technique, movement at target joints, use of target muscles after min to mod verbal, visual, tactile cues.  Person educated: Patient and Spouse Education method: Explanation, Demonstration, Verbal cues, and Handouts Education comprehension: verbalized understanding, returned demonstration, and verbal cues required   HOME EXERCISE PROGRAM:  Access Code:  [email or web address] URL: [email or web address] Date: 02/02/2023 Prepared by: Ria Comment  Exercises - Supine Ankle Pumps  - 2 x daily - 7 x weekly - 2 sets - 10 reps - Supine Quad Set  - 2 x daily - 7 x weekly - 2 sets - 10 reps - 3s hold - Supine Gluteal Sets  - 2 x daily - 7 x weekly - 2 sets - 10 reps - 3s hold - Supine Hip Abduction  - 2 x daily - 7 x weekly - 2 sets - 10 reps - Supine Heel Slide (Mirrored)  - 2 x daily - 7 x weekly - 2 sets - 10 reps - Seated Long Arc Quad (Mirrored)  - 2 x daily - 7 x weekly - 2 sets - 10 reps - 3s hold   ASSESSMENT:  CLINICAL IMPRESSION: Progressed strengthening with patient during session today and her L hip pain is not limiting for exercise.  Her left knee is significantly less painful again during session today compared to last session and does not restrict exercises.  Progress standing exercises by adding 2 pound ankle weights. Pt encouraged to continue HEP and follow-up as scheduled.  Encouraged regular icing of left hip if she continues to experience swelling. She will benefit from PT services to address deficits in strength, mobility, and pain in order to return to full function at home.    OBJECTIVE IMPAIRMENTS: Abnormal gait, difficulty walking, decreased ROM, decreased strength, and pain.   ACTIVITY LIMITATIONS: lifting, bending, sitting, standing,  squatting, sleeping, stairs, transfers, and bathing  PARTICIPATION LIMITATIONS: cleaning, shopping, community activity, and occupation  PERSONAL FACTORS: Past/current experiences, Profession, Time since onset of injury/illness/exacerbation, and 3+ comorbidities: depression, anxiety, migraines, chronic pain, and psoriatic arthritis  are also affecting patient's functional outcome.   REHAB POTENTIAL: Excellent  CLINICAL DECISION MAKING: Unstable/unpredictable  EVALUATION COMPLEXITY: High   GOALS: Goals reviewed with patient? No  SHORT TERM GOALS: Target date: 03/02/2023   Pt will  be independent with HEP in order to improve strength and range of motion as well as decrease hip pain to improve function at home and work. Baseline:  Goal status: INITIAL   LONG TERM GOALS: Target date: 03/30/2023   Pt will increase FOTO to at least 39 to demonstrate significant improvement in function at home and work related to L hip pain  Baseline: 02/02/23: 1 Goal status: INITIAL  2.  Pt will decrease worst hip pain by at least 2 points on the NPRS in order to demonstrate clinically significant reduction in hip pain. Baseline: 02/02/23: worst: 10/10 Goal status: INITIAL  3.  Pt will decrease TUG to below 14 seconds in order to demonstrate decreased fall risk.      Baseline: 02/02/23: 29.6s; Goal status: INITIAL  4.  Pt will decrease 5TSTS by at least 3 seconds in order to demonstrate clinically significant improvement in LE strength       Baseline: 02/02/23: 16.2s; Goal status: INITIAL  5.  Pt will increase pain-free strength of L hip in all directions to at least 4/5 in order to demonstrate improvement in strength and function  Baseline: 02/02/23: see above; Goal status: INITIAL  6. Pt will increase by at least 112.5' in order to demonstrate clinically significant improvement in cardiopulmonary endurance and community ambulation  Baseline: 02/05/23: 234' with RW; Goal status: INITIAL  7. Pt will increase self-selected 84m gait speed by at least 0.13 m/s in order to demonstrate clinically significant improvement in community ambulation.   Baseline: 02/05/23: self-selected: 23.6s = 0.42 m/s Goal status: INITIAL  PLAN: PT FREQUENCY: 1-2x/week  PT DURATION: 8 weeks  PLANNED INTERVENTIONS: Therapeutic exercises, Therapeutic activity, Neuromuscular re-education, Balance training, Gait training, Patient/Family education, Self Care, Joint mobilization, Joint manipulation, Vestibular training, Canalith repositioning, Orthotic/Fit training, DME instructions, Dry Needling, Electrical  stimulation, Spinal manipulation, Spinal mobilization, Cryotherapy, Moist heat, Taping, Traction, Ultrasound, Ionotophoresis 4mg /ml Dexamethasone, Manual therapy, and Re-evaluation.  PLAN FOR NEXT SESSION: measure hip ROM, additional strength testing as able, progress strengthening, review/modify HEP as necessary;   Sharalyn Ink Aloysious Vangieson PT, DPT, GCS  Barbara Faulkner, PT 02/11/2023, 1:03 PM

## 2023-02-11 ENCOUNTER — Ambulatory Visit: Payer: Federal, State, Local not specified - PPO

## 2023-02-11 DIAGNOSIS — M6281 Muscle weakness (generalized): Secondary | ICD-10-CM | POA: Diagnosis not present

## 2023-02-11 DIAGNOSIS — M25552 Pain in left hip: Secondary | ICD-10-CM | POA: Diagnosis not present

## 2023-02-16 ENCOUNTER — Ambulatory Visit: Payer: Federal, State, Local not specified - PPO

## 2023-02-16 DIAGNOSIS — M25552 Pain in left hip: Secondary | ICD-10-CM | POA: Diagnosis not present

## 2023-02-16 DIAGNOSIS — M6281 Muscle weakness (generalized): Secondary | ICD-10-CM

## 2023-02-16 NOTE — Therapy (Signed)
OUTPATIENT PHYSICAL THERAPY HIP TREATMENT  Patient Name: Barbara Faulkner MRN: [id number] DOB:07-Nov-1965, 57 y.o., female Today's Date: 02/17/2023   PT End of Session - 02/16/23 1030     Visit Number 5    Number of Visits 17    Date for PT Re-Evaluation 03/30/23    Authorization Type eval: 02/02/23    Authorization Time Period [employer] 2024 VL:75 combined visits per year    Authorization - Visit Number 18    Authorization - Number of Visits 75    PT Start Time 1027    PT Stop Time 1100    PT Time Calculation (min) 33 min    Equipment Utilized During Treatment Gait belt    Activity Tolerance Patient tolerated treatment well    Behavior During Therapy WFL for tasks assessed/performed            Past Medical History:  Diagnosis Date   Allergy    Anxiety    Chronic kidney disease    Chronic pain    Chronic sinusitis    COVID-19    09/14/20, 06/13/21   Depression    Heart murmur    Hiatal hernia    HPV (human papilloma virus) anogenital infection    Macromastia    Migraine    Psoriatic arthritis (HCC)    Shingles    UTI (urinary tract infection)    ecoli 09/2021   Past Surgical History:  Procedure Laterality Date   BREAST BIOPSY Right 01/28/2018   US guided biopsy - heart shaped   BREAST REDUCTION SURGERY Bilateral 04/03/2020   Procedure: MAMMARY REDUCTION  (BREAST);  Surgeon: Contogiannis, Chales Abrahams, MD;  Location: [facility];  Service: Plastics;  Laterality: Bilateral;  HAVE LIPOSUCTION MACHINE AVAILABLE   CHOLECYSTECTOMY  2009   COLONOSCOPY WITH PROPOFOL N/A 07/19/2018   Procedure: COLONOSCOPY WITH PROPOFOL;  Surgeon: Toney Reil, MD;  Location: [facility] ENDOSCOPY;  Service: Gastroenterology;  Laterality: N/A;   COLONOSCOPY WITH PROPOFOL N/A 09/17/2021   Procedure: COLONOSCOPY WITH PROPOFOL;  Surgeon: Toney Reil, MD;  Location: [facility] ENDOSCOPY;  Service: Gastroenterology;  Laterality: N/A;   COLONOSCOPY WITH PROPOFOL N/A 09/18/2021   Procedure:  COLONOSCOPY WITH PROPOFOL;  Surgeon: Toney Reil, MD;  Location: [facility] ENDOSCOPY;  Service: Gastroenterology;  Laterality: N/A;   ESOPHAGOGASTRODUODENOSCOPY (EGD) WITH PROPOFOL N/A 07/19/2018   Procedure: ESOPHAGOGASTRODUODENOSCOPY (EGD) WITH PROPOFOL;  Surgeon: Toney Reil, MD;  Location: [facility] ENDOSCOPY;  Service: Gastroenterology;  Laterality: N/A;   GASTRIC BYPASS     2015; duodenal switch    LAPAROSCOPIC GASTRIC BANDING  2008   removed 2009   REDUCTION MAMMAPLASTY     TUBAL LIGATION  1997   Patient Active Problem List   Diagnosis Date Noted   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) 10/21/2022   Lumbar radicular pain 10/01/2022   Fibromyalgia 10/01/2022   Normocytic anemia 06/13/2022   Elevated alkaline phosphatase level 05/28/2022   Chills 05/26/2022   Chronic left SI joint pain 11/13/2021   Piriformis syndrome, left 11/13/2021   Greater 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 11/13/2021   Primary osteoarthritis of right hip 09/16/2021   Plantar fasciitis 08/22/2021   Carpal tunnel syndrome on right 08/22/2021   Primary osteoarthritis of left hip 08/15/2021   Hyperphosphatemia 08/01/2021   Anemia in chronic kidney disease 04/18/2021   Chronic kidney disease, stage 3a (HCC) 04/18/2021   Hyperparathyroidism due to renal insufficiency (HCC) 04/18/2021   Hypertension 04/18/2021   Hiatal hernia 11/02/2020  History of migraine 11/02/2020   Gastroesophageal reflux disease 11/02/2020   Brain fog 11/02/2020   Depression, recurrent (HCC) 03/18/2020   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 09/08/2018   Arthropathy of right shoulder 09/08/2018   Controlled substance agreement signed 09/08/2018   Myofascial pain syndrome 08/24/2018   Routine physical examination 08/24/2018   Iron deficiency anemia 08/24/2018   Elevated liver enzymes 07/19/2018   Vitamin D deficiency 07/19/2018   Colon cancer screening    Abdominal pain, epigastric 07/08/2018    Right shoulder pain 06/15/2018   Hemorrhoids 05/01/2017   Anxiety and depression 11/24/2016   Lumbar pain with radiation down left leg 09/11/2016   Large breasts 09/11/2016   Parotiditis 05/01/2016   Abnormal laboratory test result 03/26/2016   Proteinuria 03/26/2016   Right hip pain 04/18/2015   Sinusitis 01/08/2014   Psoriatic arthritis (HCC) 09/13/2013   DUB (dysfunctional uterine bleeding) 10/27/2012   Gastritis due to nonsteroidal anti-inflammatory drug 12/12/2011   Anaphylactic reaction due to shellfish 09/27/2010   THYROMEGALY 09/03/2010   ALLERGIC RHINITIS 03/15/2010   MIGRAINE [name] 05/17/2008   PCP: Rennie Plowman FNP  REFERRING PROVIDER: Cassell Smiles MD  REFERRING DIAG: M16.12 (ICD-10-CM) - Unilateral primary osteoarthritis, left hip   Rationale for Evaluation and Treatment: Rehabilitation  THERAPY DIAG: Pain in left hip  Muscle weakness (generalized)  ONSET DATE: 09/02/2015 (approximate)  FOLLOW-UP APPT SCHEDULED WITH REFERRING PROVIDER: Yes   FROM INITIAL EVALUATION SUBJECTIVE:                                                                                                                                                                                         SUBJECTIVE STATEMENT: L anterior approach THA on 01/28/23  PERTINENT HISTORY:  Pt underwent L anterior approach THA on 01/28/23. She has struggled with pain control since surgery but otherwise no reported complications. She is using compressive icing 2x/day and performing ankle pumps.  History from 07/08/22 Pt reports a diagnosis of Lyme Disease in 2017 with persistent widespread chronic joint pain after treatment.  Of note she has severe L hip pain as well as pain in her L knee and right shoulder. Her L hip pain has been worsening recently. She denies any pain in her R hip or L shoulder. She was previously receiving L hip injections from Dr. Martha Clan however she reports that  he refused to continue with further injections so she was referred by pain management to Dr. Ashley Royalty. She saw Dr. Ashley Royalty and pt received a L hip intraarticular injection as well as an injection in her L SIJ and L greater trochanter. The injections have  helped with her pain. She was also referred to an orthopedic surgeon to discuss THR and referred for physical therapy. Pt expresses that she really enjoys exercising but is very limited by her pain.   PAIN:  Pain Intensity: Present: 8/10, Best: 7/10, Worst: 10/10 Pain location: Anterior L hip, L knee and into the calf; Pain Quality: constant  Radiating: Yes, down the posterior L thigh Numbness/Tingling: Yes, slight anterior/lateral hip numbness Focal Weakness: Yes, L hip weakness; Position of comfort: laying down (supine) Aggravating factors: extended standing, extended sitting, walking; Relieving factors: laying on back, body pillow, ice, pain medications; 24-hour pain behavior: worse at the end of the day History of prior back injury, pain, surgery, or therapy: Yes, history of chronic L hip pain; Dominant hand: right Imaging: Yes, IMPRESSION: 1. 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 2. Moderate pubic symphysis osteoarthritis. Red flags: Negative for bowel/bladder changes, saddle paresthesia, personal history of cancer, h/o spinal tumors, h/o compression fx, h/o abdominal aneurysm, abdominal pain, chills/fever, night sweats, nausea, vomiting, unrelenting pain,   PRECAUTIONS: Anterior hip  WEIGHT BEARING RESTRICTIONS: No  FALLS: Has patient fallen in last 6 months? No  Living Environment Lives with: lives with their spouse Lives in: House/apartment, one step to enter from the garage Stairs: Yes: Internal: 12 steps; on right going up Has following equipment at home: Single point cane, Walker - 2 wheeled, and Shower bench  Prior level of function: Independent  Occupational demands: works at [employer], stands for 8-10 hour shifts  Hobbies: Exercising;  Patient Goals: Return to exercising.   OBJECTIVE:   Patient Surveys  LEFS to be completed FOTO 1, predicted improvement to 36  Cognition Patient is oriented to person, place, and time.  Recent memory is intact.  Remote memory is intact.  Attention span and concentration are intact.  Expressive speech is intact.  Patient's fund of knowledge is within normal limits for educational level.    Gross Musculoskeletal Assessment Tremor: None Bulk: Normal Tone: Normal Area around bandage is dry without signs of erythema, edema, or ecchymosis. Dried blood on bandage but not excessive. Superior margin of bandage is peeling off and pt plans to go to ortho for a clean dressing after therapy session.  GAIT: Antalgic gait on LLE with L hip externally rotated during gait. Significantly decreased self-selected gait speed. Full gait assessment deferred  Posture: Lumbar lordosis: WNL Iliac crest height: Equal bilaterally Lumbar lateral shift: Negative  AROM AROM (Normal range in degrees) AROM   Hip Right Left  Flexion (125) WNL At least 90 degrees. Pain limited AROM for all AROM  Extension (15)    Abduction (40)    Adduction     Internal Rotation (45)    External Rotation (45)        Knee    Flexion (135) WNL WNL  Extension (0) 0 0  (* = pain; Blank rows = not tested)  LE MMT: MMT (out of 5) Right  Left   Hip flexion 5 <3  Hip extension    Hip abduction (seated) 4+ 3+*  Hip adduction 4+ 4*  Hip internal rotation    Hip external rotation    Knee flexion (seated) 4+ 4+  Knee extension 5 5  Ankle dorsiflexion 5 5  Ankle plantarflexion    Ankle inversion    Ankle eversion    (* = pain; Blank rows = not tested)  Sensation Intact and symmetrical to light touch throughout BLE L2-S2. Hot/cold and proprioception  deferred;  Muscle Length Hamstrings: R: Positive for shortening around 70 degrees L: Positive for  shortening around 70 degrees [place] (quadriceps): R: Not examined L: Not examined Thomas (hip flexors): R: Not examined L: Not examined [place]: R: Not examined L: Not examined  Functional Tests TUG: 29.6s 5TSTS: 16.2s   TREATMENT   SUBJECTIVE: Pt reports that she is doing alright today.  Her left knee pain has continued to be painful and she rates it as 5/10 currently. She reports continued swelling in her L hip but no warmth, redness, or discharge from the incision. She has a post-op follow appointment today to see Dr. Odis Luster. She is performing HEP without issue.  She continues ambulating with a single-point cane and has been ambulating some around the house without an assistive device.  No specific questions or concerns.   PAIN: 4/10 L hip pain, 5/10 L knee pain. ;   Ther-ex  NuStep L0-4 x 10:00 minutes for warm-up, BLE strengthening, and while taking an interval history;  Forward 6" step-ups with BUE support leading with LLE x 10; L lateral 6" step-ups with BUE support x 10;  Standing exercises with 3# ankle weights (AW): Hip flexion marches x 10 BLE; Standing hip abduction x 10 BLE; Hamstring curls x 10 BLE;  Standing mini squats x 10; Standing heel raises x 20; Seated LAQ with 3# AW x 10 BLE; Seated clams with manual resistance 2 x 10 BLE; Seated adductor squeeze with manual resistance 2 x 10 BLE;  Not performed: Side stepping with 2# AW x multiple laps; Supine LLE heel slides with resisted extension 2 x 10; Hooklying clams with manual resistance x 10; Hooklying adductor squeeze with manual resistance x 10; Hooklying bridges 2 x 10;    PATIENT EDUCATION:  Education details: Pt educated throughout session about proper posture and technique with exercises. Improved exercise technique, movement at target joints, use of target muscles after min to mod verbal, visual, tactile cues.  Person educated: Patient and Spouse Education method: Explanation, Demonstration, Verbal  cues, and Handouts Education comprehension: verbalized understanding, returned demonstration, and verbal cues required   HOME EXERCISE PROGRAM:  Access Code: [email or web address] URL: [email or web address] Date: 02/02/2023 Prepared by: Ria Comment  Exercises - Supine Ankle Pumps  - 2 x daily - 7 x weekly - 2 sets - 10 reps - Supine Quad Set  - 2 x daily - 7 x weekly - 2 sets - 10 reps - 3s hold - Supine Gluteal Sets  - 2 x daily - 7 x weekly - 2 sets - 10 reps - 3s hold - Supine Hip Abduction  - 2 x daily - 7 x weekly - 2 sets - 10 reps - Supine Heel Slide (Mirrored)  - 2 x daily - 7 x weekly - 2 sets - 10 reps - Seated Long Arc Quad (Mirrored)  - 2 x daily - 7 x weekly - 2 sets - 10 reps - 3s hold   ASSESSMENT:  CLINICAL IMPRESSION: Progressed strengthening with patient during session today and her L hip pain is not limiting for exercise.  Progressed standing exercises by increasing ankle weights to 3 pounds. Pt encouraged to continue HEP and follow-up as scheduled.  Encouraged regular icing of left hip if she continues to experience swelling. She will benefit from PT services to address deficits in strength, mobility, and pain in order to return to full function at home.    OBJECTIVE IMPAIRMENTS: Abnormal gait, difficulty walking, decreased ROM, decreased strength, and pain.  ACTIVITY LIMITATIONS: lifting, bending, sitting, standing, squatting, sleeping, stairs, transfers, and bathing  PARTICIPATION LIMITATIONS: cleaning, shopping, community activity, and occupation  PERSONAL FACTORS: Past/current experiences, Profession, Time since onset of injury/illness/exacerbation, and 3+ comorbidities: depression, anxiety, migraines, chronic pain, and psoriatic arthritis  are also affecting patient's functional outcome.   REHAB POTENTIAL: Excellent  CLINICAL DECISION MAKING: Unstable/unpredictable  EVALUATION COMPLEXITY: High   GOALS: Goals reviewed with patient? No  SHORT TERM  GOALS: Target date: 03/02/2023   Pt will be independent with HEP in order to improve strength and range of motion as well as decrease hip pain to improve function at home and work. Baseline:  Goal status: INITIAL   LONG TERM GOALS: Target date: 03/30/2023   Pt will increase FOTO to at least 39 to demonstrate significant improvement in function at home and work related to L hip pain  Baseline: 02/02/23: 1 Goal status: INITIAL  2.  Pt will decrease worst hip pain by at least 2 points on the NPRS in order to demonstrate clinically significant reduction in hip pain. Baseline: 02/02/23: worst: 10/10 Goal status: INITIAL  3.  Pt will decrease TUG to below 14 seconds in order to demonstrate decreased fall risk.      Baseline: 02/02/23: 29.6s; Goal status: INITIAL  4.  Pt will decrease 5TSTS by at least 3 seconds in order to demonstrate clinically significant improvement in LE strength       Baseline: 02/02/23: 16.2s; Goal status: INITIAL  5.  Pt will increase pain-free strength of L hip in all directions to at least 4/5 in order to demonstrate improvement in strength and function  Baseline: 02/02/23: see above; Goal status: INITIAL  6. Pt will increase by at least 112.5' in order to demonstrate clinically significant improvement in cardiopulmonary endurance and community ambulation  Baseline: 02/05/23: 234' with RW; Goal status: INITIAL  7. Pt will increase self-selected 38m gait speed by at least 0.13 m/s in order to demonstrate clinically significant improvement in community ambulation.   Baseline: 02/05/23: self-selected: 23.6s = 0.42 m/s Goal status: INITIAL  PLAN: PT FREQUENCY: 1-2x/week  PT DURATION: 8 weeks  PLANNED INTERVENTIONS: Therapeutic exercises, Therapeutic activity, Neuromuscular re-education, Balance training, Gait training, Patient/Family education, Self Care, Joint mobilization, Joint manipulation, Vestibular training, Canalith repositioning, Orthotic/Fit training, DME  instructions, Dry Needling, Electrical stimulation, Spinal manipulation, Spinal mobilization, Cryotherapy, Moist heat, Taping, Traction, Ultrasound, Ionotophoresis 4mg /ml Dexamethasone, Manual therapy, and Re-evaluation.  PLAN FOR NEXT SESSION: measure hip ROM, additional strength testing as able, progress strengthening, review/modify HEP as necessary;   Sharalyn Ink Callyn Severtson PT, DPT, GCS  Jervis Trapani, PT 02/17/2023, 1:01 PM

## 2023-02-18 ENCOUNTER — Ambulatory Visit: Payer: Federal, State, Local not specified - PPO

## 2023-02-18 NOTE — Therapy (Signed)
OUTPATIENT PHYSICAL THERAPY HIP TREATMENT  Patient Name: Barbara Faulkner MRN: [id number] DOB:1966-07-10, 57 y.o., female Today's Date: 02/19/2023   PT End of Session - 02/19/23 1315     Visit Number 6    Number of Visits 17    Date for PT Re-Evaluation 03/30/23    Authorization Type eval: 02/02/23    Authorization Time Period [employer] 2024 VL:75 combined visits per year    Authorization - Visit Number 19    Authorization - Number of Visits 75    PT Start Time 1316    PT Stop Time 1400    PT Time Calculation (min) 44 min    Equipment Utilized During Treatment Gait belt    Activity Tolerance Patient tolerated treatment well    Behavior During Therapy WFL for tasks assessed/performed            Past Medical History:  Diagnosis Date   Allergy    Anxiety    Chronic kidney disease    Chronic pain    Chronic sinusitis    COVID-19    09/14/20, 06/13/21   Depression    Heart murmur    Hiatal hernia    HPV (human papilloma virus) anogenital infection    Macromastia    Migraine    Psoriatic arthritis (HCC)    Shingles    UTI (urinary tract infection)    ecoli 09/2021   Past Surgical History:  Procedure Laterality Date   BREAST BIOPSY Right 01/28/2018   US guided biopsy - heart shaped   BREAST REDUCTION SURGERY Bilateral 04/03/2020   Procedure: MAMMARY REDUCTION  (BREAST);  Surgeon: Contogiannis, Chales Abrahams, MD;  Location: [facility];  Service: Plastics;  Laterality: Bilateral;  HAVE LIPOSUCTION MACHINE AVAILABLE   CHOLECYSTECTOMY  2009   COLONOSCOPY WITH PROPOFOL N/A 07/19/2018   Procedure: COLONOSCOPY WITH PROPOFOL;  Surgeon: Toney Reil, MD;  Location: [facility] ENDOSCOPY;  Service: Gastroenterology;  Laterality: N/A;   COLONOSCOPY WITH PROPOFOL N/A 09/17/2021   Procedure: COLONOSCOPY WITH PROPOFOL;  Surgeon: Toney Reil, MD;  Location: [facility] ENDOSCOPY;  Service: Gastroenterology;  Laterality: N/A;   COLONOSCOPY WITH PROPOFOL N/A 09/18/2021   Procedure:  COLONOSCOPY WITH PROPOFOL;  Surgeon: Toney Reil, MD;  Location: [facility] ENDOSCOPY;  Service: Gastroenterology;  Laterality: N/A;   ESOPHAGOGASTRODUODENOSCOPY (EGD) WITH PROPOFOL N/A 07/19/2018   Procedure: ESOPHAGOGASTRODUODENOSCOPY (EGD) WITH PROPOFOL;  Surgeon: Toney Reil, MD;  Location: [facility] ENDOSCOPY;  Service: Gastroenterology;  Laterality: N/A;   GASTRIC BYPASS     2015; duodenal switch    LAPAROSCOPIC GASTRIC BANDING  2008   removed 2009   REDUCTION MAMMAPLASTY     TUBAL LIGATION  1997   Patient Active Problem List   Diagnosis Date Noted   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) 10/21/2022   Lumbar radicular pain 10/01/2022   Fibromyalgia 10/01/2022   Normocytic anemia 06/13/2022   Elevated alkaline phosphatase level 05/28/2022   Chills 05/26/2022   Chronic left SI joint pain 11/13/2021   Piriformis syndrome, left 11/13/2021   Greater 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 11/13/2021   Primary osteoarthritis of right hip 09/16/2021   Plantar fasciitis 08/22/2021   Carpal tunnel syndrome on right 08/22/2021   Primary osteoarthritis of left hip 08/15/2021   Hyperphosphatemia 08/01/2021   Anemia in chronic kidney disease 04/18/2021   Chronic kidney disease, stage 3a (HCC) 04/18/2021   Hyperparathyroidism due to renal insufficiency (HCC) 04/18/2021   Hypertension 04/18/2021   Hiatal hernia 11/02/2020  History of migraine 11/02/2020   Gastroesophageal reflux disease 11/02/2020   Brain fog 11/02/2020   Depression, recurrent (HCC) 03/18/2020   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 09/08/2018   Arthropathy of right shoulder 09/08/2018   Controlled substance agreement signed 09/08/2018   Myofascial pain syndrome 08/24/2018   Routine physical examination 08/24/2018   Iron deficiency anemia 08/24/2018   Elevated liver enzymes 07/19/2018   Vitamin D deficiency 07/19/2018   Colon cancer screening    Abdominal pain, epigastric 07/08/2018    Right shoulder pain 06/15/2018   Hemorrhoids 05/01/2017   Anxiety and depression 11/24/2016   Lumbar pain with radiation down left leg 09/11/2016   Large breasts 09/11/2016   Parotiditis 05/01/2016   Abnormal laboratory test result 03/26/2016   Proteinuria 03/26/2016   Right hip pain 04/18/2015   Sinusitis 01/08/2014   Psoriatic arthritis (HCC) 09/13/2013   DUB (dysfunctional uterine bleeding) 10/27/2012   Gastritis due to nonsteroidal anti-inflammatory drug 12/12/2011   Anaphylactic reaction due to shellfish 09/27/2010   THYROMEGALY 09/03/2010   ALLERGIC RHINITIS 03/15/2010   MIGRAINE [name] 05/17/2008   PCP: Rennie Plowman FNP  REFERRING PROVIDER: Cassell Smiles MD  REFERRING DIAG: M16.12 (ICD-10-CM) - Unilateral primary osteoarthritis, left hip   Rationale for Evaluation and Treatment: Rehabilitation  THERAPY DIAG: Pain in left hip  Muscle weakness (generalized)  ONSET DATE: 09/02/2015 (approximate)  FOLLOW-UP APPT SCHEDULED WITH REFERRING PROVIDER: Yes   FROM INITIAL EVALUATION SUBJECTIVE:                                                                                                                                                                                         SUBJECTIVE STATEMENT: L anterior approach THA on 01/28/23  PERTINENT HISTORY:  Pt underwent L anterior approach THA on 01/28/23. She has struggled with pain control since surgery but otherwise no reported complications. She is using compressive icing 2x/day and performing ankle pumps.  History from 07/08/22 Pt reports a diagnosis of Lyme Disease in 2017 with persistent widespread chronic joint pain after treatment.  Of note she has severe L hip pain as well as pain in her L knee and right shoulder. Her L hip pain has been worsening recently. She denies any pain in her R hip or L shoulder. She was previously receiving L hip injections from Dr. Martha Clan however she reports that  he refused to continue with further injections so she was referred by pain management to Dr. Ashley Royalty. She saw Dr. Ashley Royalty and pt received a L hip intraarticular injection as well as an injection in her L SIJ and L greater trochanter. The injections have  helped with her pain. She was also referred to an orthopedic surgeon to discuss THR and referred for physical therapy. Pt expresses that she really enjoys exercising but is very limited by her pain.   PAIN:  Pain Intensity: Present: 8/10, Best: 7/10, Worst: 10/10 Pain location: Anterior L hip, L knee and into the calf; Pain Quality: constant  Radiating: Yes, down the posterior L thigh Numbness/Tingling: Yes, slight anterior/lateral hip numbness Focal Weakness: Yes, L hip weakness; Position of comfort: laying down (supine) Aggravating factors: extended standing, extended sitting, walking; Relieving factors: laying on back, body pillow, ice, pain medications; 24-hour pain behavior: worse at the end of the day History of prior back injury, pain, surgery, or therapy: Yes, history of chronic L hip pain; Dominant hand: right Imaging: Yes, IMPRESSION: 1. 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 2. Moderate pubic symphysis osteoarthritis. Red flags: Negative for bowel/bladder changes, saddle paresthesia, personal history of cancer, h/o spinal tumors, h/o compression fx, h/o abdominal aneurysm, abdominal pain, chills/fever, night sweats, nausea, vomiting, unrelenting pain,   PRECAUTIONS: Anterior hip  WEIGHT BEARING RESTRICTIONS: No  FALLS: Has patient fallen in last 6 months? No  Living Environment Lives with: lives with their spouse Lives in: House/apartment, one step to enter from the garage Stairs: Yes: Internal: 12 steps; on right going up Has following equipment at home: Single point cane, Walker - 2 wheeled, and Shower bench  Prior level of function: Independent  Occupational demands: works at [employer], stands for 8-10 hour shifts  Hobbies: Exercising;  Patient Goals: Return to exercising.   OBJECTIVE:   Patient Surveys  LEFS to be completed FOTO 1, predicted improvement to 36  Cognition Patient is oriented to person, place, and time.  Recent memory is intact.  Remote memory is intact.  Attention span and concentration are intact.  Expressive speech is intact.  Patient's fund of knowledge is within normal limits for educational level.    Gross Musculoskeletal Assessment Tremor: None Bulk: Normal Tone: Normal Area around bandage is dry without signs of erythema, edema, or ecchymosis. Dried blood on bandage but not excessive. Superior margin of bandage is peeling off and pt plans to go to ortho for a clean dressing after therapy session.  GAIT: Antalgic gait on LLE with L hip externally rotated during gait. Significantly decreased self-selected gait speed. Full gait assessment deferred  Posture: Lumbar lordosis: WNL Iliac crest height: Equal bilaterally Lumbar lateral shift: Negative  AROM AROM (Normal range in degrees) AROM   Hip Right Left  Flexion (125) WNL At least 90 degrees. Pain limited AROM for all AROM  Extension (15)    Abduction (40)    Adduction     Internal Rotation (45)    External Rotation (45)        Knee    Flexion (135) WNL WNL  Extension (0) 0 0  (* = pain; Blank rows = not tested)  LE MMT: MMT (out of 5) Right  Left   Hip flexion 5 <3  Hip extension    Hip abduction (seated) 4+ 3+*  Hip adduction 4+ 4*  Hip internal rotation    Hip external rotation    Knee flexion (seated) 4+ 4+  Knee extension 5 5  Ankle dorsiflexion 5 5  Ankle plantarflexion    Ankle inversion    Ankle eversion    (* = pain; Blank rows = not tested)  Sensation Intact and symmetrical to light touch throughout BLE L2-S2. Hot/cold and proprioception  deferred;  Muscle Length Hamstrings: R: Positive for shortening around 70 degrees L: Positive for  shortening around 70 degrees [place] (quadriceps): R: Not examined L: Not examined Thomas (hip flexors): R: Not examined L: Not examined [place]: R: Not examined L: Not examined  Functional Tests TUG: 29.6s 5TSTS: 16.2s   TREATMENT   SUBJECTIVE: Pt reports that she is doing alright today.  Her L knee has remained painful. She reports continued swelling in her L hip but no warmth, redness, or discharge from the incision. She saw Dr Odis Luster who asked her to return next week for a recheck of the L hip incision and to perform radiographs of her L knee. She asked him about her leg length discrepancy and per pt he advised her that he felt like it would improve as the swelling decreased in her L hip. She is performing her HEP without issue. Pt continues ambulating with a single-point cane and also without an assistive device.    PAIN: L hip (at site of incision) and L knee;   Ther-ex  NuStep L0-6 x 10:00 minutes for warm-up, BLE strengthening, and while obtaining an interval history; Heel lift provided for RLE, pt ambulates in the gym and reports improvement in her L knee pain and gait mechanics; Total Gym Level 22 double leg squats 2 x 10; Supine LLE heel slides with resisted extension 2 x 10; Supine LLE hip abduction with manual resistance 2 x 10; Supine LLE hip adduction with manual resistance 2 x 10; Hooklying clams with manual resistance 2 x 10; Hooklying adductor squeeze with manual resistance 2 x 10; Hooklying bridges 2 x 10;   Not performed: Side stepping with 2# AW x multiple laps; Forward 6" step-ups with BUE support leading with LLE x 10; L lateral 6" step-ups with BUE support x 10; Standing mini squats x 10; Standing heel raises x 20; Seated LAQ with 3# AW x 10 BLE; Seated clams with manual resistance 2 x 10 BLE; Seated adductor squeeze with manual resistance 2 x 10 BLE;  Standing exercises with 3# ankle weights (AW): Hip flexion marches x 10 BLE; Standing hip abduction x 10  BLE; Hamstring curls x 10 BLE;   PATIENT EDUCATION:  Education details: Pt educated throughout session about proper posture and technique with exercises. Improved exercise technique, movement at target joints, use of target muscles after min to mod verbal, visual, tactile cues. Heel lift Person educated: Patient and Spouse Education method: Explanation, Demonstration, Verbal cues, and Handouts Education comprehension: verbalized understanding, returned demonstration, and verbal cues required   HOME EXERCISE PROGRAM:  Access Code: [email or web address] URL: [email or web address] Date: 02/02/2023 Prepared by: Ria Comment  Exercises - Supine Ankle Pumps  - 2 x daily - 7 x weekly - 2 sets - 10 reps - Supine Quad Set  - 2 x daily - 7 x weekly - 2 sets - 10 reps - 3s hold - Supine Gluteal Sets  - 2 x daily - 7 x weekly - 2 sets - 10 reps - 3s hold - Supine Hip Abduction  - 2 x daily - 7 x weekly - 2 sets - 10 reps - Supine Heel Slide (Mirrored)  - 2 x daily - 7 x weekly - 2 sets - 10 reps - Seated Long Arc Quad (Mirrored)  - 2 x daily - 7 x weekly - 2 sets - 10 reps - 3s hold   ASSESSMENT:  CLINICAL IMPRESSION: Continued strengthening with patient during session today. Inserted RLE heel lift to  account for LLD which improves gait mechanics and decreases her L knee pain. Discussed means to account for LLD and if it does not improve can refer to [employer] to discuss built-in shoe lifts. Plan to progress strengthening at future sessions. Pt encouraged to continue HEP and follow-up as scheduled.  Encouraged regular icing of left hip if she continues to experience swelling. She will benefit from PT services to address deficits in strength, mobility, and pain in order to return to full function at home.    OBJECTIVE IMPAIRMENTS: Abnormal gait, difficulty walking, decreased ROM, decreased strength, and pain.   ACTIVITY LIMITATIONS: lifting, bending, sitting, standing, squatting, sleeping,  stairs, transfers, and bathing  PARTICIPATION LIMITATIONS: cleaning, shopping, community activity, and occupation  PERSONAL FACTORS: Past/current experiences, Profession, Time since onset of injury/illness/exacerbation, and 3+ comorbidities: depression, anxiety, migraines, chronic pain, and psoriatic arthritis  are also affecting patient's functional outcome.   REHAB POTENTIAL: Excellent  CLINICAL DECISION MAKING: Unstable/unpredictable  EVALUATION COMPLEXITY: High   GOALS: Goals reviewed with patient? No  SHORT TERM GOALS: Target date: 03/02/2023   Pt will be independent with HEP in order to improve strength and range of motion as well as decrease hip pain to improve function at home and work. Baseline:  Goal status: INITIAL   LONG TERM GOALS: Target date: 03/30/2023   Pt will increase FOTO to at least 39 to demonstrate significant improvement in function at home and work related to L hip pain  Baseline: 02/02/23: 1 Goal status: INITIAL  2.  Pt will decrease worst hip pain by at least 2 points on the NPRS in order to demonstrate clinically significant reduction in hip pain. Baseline: 02/02/23: worst: 10/10 Goal status: INITIAL  3.  Pt will decrease TUG to below 14 seconds in order to demonstrate decreased fall risk.      Baseline: 02/02/23: 29.6s; Goal status: INITIAL  4.  Pt will decrease 5TSTS by at least 3 seconds in order to demonstrate clinically significant improvement in LE strength       Baseline: 02/02/23: 16.2s; Goal status: INITIAL  5.  Pt will increase pain-free strength of L hip in all directions to at least 4/5 in order to demonstrate improvement in strength and function  Baseline: 02/02/23: see above; Goal status: INITIAL  6. Pt will increase by at least 112.5' in order to demonstrate clinically significant improvement in cardiopulmonary endurance and community ambulation  Baseline: 02/05/23: 234' with RW; Goal status: INITIAL  7. Pt will increase self-selected  60m gait speed by at least 0.13 m/s in order to demonstrate clinically significant improvement in community ambulation.   Baseline: 02/05/23: self-selected: 23.6s = 0.42 m/s Goal status: INITIAL  PLAN: PT FREQUENCY: 1-2x/week  PT DURATION: 8 weeks  PLANNED INTERVENTIONS: Therapeutic exercises, Therapeutic activity, Neuromuscular re-education, Balance training, Gait training, Patient/Family education, Self Care, Joint mobilization, Joint manipulation, Vestibular training, Canalith repositioning, Orthotic/Fit training, DME instructions, Dry Needling, Electrical stimulation, Spinal manipulation, Spinal mobilization, Cryotherapy, Moist heat, Taping, Traction, Ultrasound, Ionotophoresis 4mg /ml Dexamethasone, Manual therapy, and Re-evaluation.  PLAN FOR NEXT SESSION: progress strengthening, review/modify HEP as necessary;   Sharalyn Ink Rhylynn Perdomo PT, DPT, GCS  Carlesha Seiple, PT 02/19/2023, 5:06 PM

## 2023-02-19 ENCOUNTER — Ambulatory Visit: Payer: Federal, State, Local not specified - PPO

## 2023-02-19 DIAGNOSIS — M6281 Muscle weakness (generalized): Secondary | ICD-10-CM | POA: Diagnosis not present

## 2023-02-19 DIAGNOSIS — M25552 Pain in left hip: Secondary | ICD-10-CM

## 2023-02-23 ENCOUNTER — Ambulatory Visit: Payer: Federal, State, Local not specified - PPO

## 2023-02-23 DIAGNOSIS — F419 Anxiety disorder, unspecified: Secondary | ICD-10-CM | POA: Diagnosis not present

## 2023-02-23 DIAGNOSIS — F4312 Post-traumatic stress disorder, chronic: Secondary | ICD-10-CM | POA: Diagnosis not present

## 2023-02-23 DIAGNOSIS — F39 Unspecified mood [affective] disorder: Secondary | ICD-10-CM | POA: Diagnosis not present

## 2023-02-24 DIAGNOSIS — Z96642 Presence of left artificial hip joint: Secondary | ICD-10-CM | POA: Diagnosis not present

## 2023-02-25 ENCOUNTER — Ambulatory Visit: Payer: Federal, State, Local not specified - PPO

## 2023-02-25 DIAGNOSIS — M25552 Pain in left hip: Secondary | ICD-10-CM | POA: Diagnosis not present

## 2023-02-25 DIAGNOSIS — M6281 Muscle weakness (generalized): Secondary | ICD-10-CM

## 2023-02-25 NOTE — Therapy (Signed)
OUTPATIENT PHYSICAL THERAPY HIP TREATMENT  Patient Name: Barbara Faulkner MRN: [id number] DOB:[birth year], 57 y.o., female Today's Date: 02/25/2023   PT End of Session - 02/25/23 1321     Visit Number 7    Number of Visits 17    Date for PT Re-Evaluation 03/30/23    PT Start Time 1017    PT Stop Time 1105    PT Time Calculation (min) 48 min    Activity Tolerance Patient tolerated treatment well;Patient limited by pain    Behavior During Therapy [facility] for tasks assessed/performed             Past Medical History:  Diagnosis Date   Allergy    Anxiety    Chronic kidney disease    Chronic pain    Chronic sinusitis    COVID-19    09/14/20, 06/13/21   Depression    Heart murmur    Hiatal hernia    HPV (human papilloma virus) anogenital infection    Macromastia    Migraine    Psoriatic arthritis (HCC)    Shingles    UTI (urinary tract infection)    ecoli 09/2021   Past Surgical History:  Procedure Laterality Date   BREAST BIOPSY Right 01/28/2018   US guided biopsy - heart shaped   BREAST REDUCTION SURGERY Bilateral 04/03/2020   Procedure: MAMMARY REDUCTION  (BREAST);  Surgeon: Contogiannis, Chales Abrahams, MD;  Location: [facility];  Service: Plastics;  Laterality: Bilateral;  HAVE LIPOSUCTION MACHINE AVAILABLE   CHOLECYSTECTOMY  2009   COLONOSCOPY WITH PROPOFOL N/A 07/19/2018   Procedure: COLONOSCOPY WITH PROPOFOL;  Surgeon: Toney Reil, MD;  Location: [facility] ENDOSCOPY;  Service: Gastroenterology;  Laterality: N/A;   COLONOSCOPY WITH PROPOFOL N/A 09/17/2021   Procedure: COLONOSCOPY WITH PROPOFOL;  Surgeon: Toney Reil, MD;  Location: [facility] ENDOSCOPY;  Service: Gastroenterology;  Laterality: N/A;   COLONOSCOPY WITH PROPOFOL N/A 09/18/2021   Procedure: COLONOSCOPY WITH PROPOFOL;  Surgeon: Toney Reil, MD;  Location: [facility] ENDOSCOPY;  Service: Gastroenterology;  Laterality: N/A;   ESOPHAGOGASTRODUODENOSCOPY (EGD) WITH PROPOFOL N/A 07/19/2018    Procedure: ESOPHAGOGASTRODUODENOSCOPY (EGD) WITH PROPOFOL;  Surgeon: Toney Reil, MD;  Location[facility] ENDOSCOPY;  Service: Gastroenterology;  Laterality: N/A;   GASTRIC BYPASS     2015; duodenal switch    LAPAROSCOPIC GASTRIC BANDING  2008   removed 2009   REDUCTION MAMMAPLASTY     TUBAL LIGATION  1997   Patient Active Problem List   Diagnosis Date Noted   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) 10/21/2022   Lumbar radicular pain 10/01/2022   Fibromyalgia 10/01/2022   Normocytic anemia 06/13/2022   Elevated alkaline phosphatase level 05/28/2022   Chills 05/26/2022   Chronic left SI joint pain 11/13/2021   Piriformis syndrome, left 11/13/2021   Greater 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 11/13/2021   Primary osteoarthritis of right hip 09/16/2021   Plantar fasciitis 08/22/2021   Carpal tunnel syndrome on right 08/22/2021   Primary osteoarthritis of left hip 08/15/2021   Hyperphosphatemia 08/01/2021   Anemia in chronic kidney disease 04/18/2021   Chronic kidney disease, stage 3a (HCC) 04/18/2021   Hyperparathyroidism due to renal insufficiency (HCC) 04/18/2021   Hypertension 04/18/2021   Hiatal hernia 11/02/2020   History of migraine 11/02/2020   Gastroesophageal reflux disease 11/02/2020   Brain fog 11/02/2020   Depression, recurrent (HCC) 03/18/2020   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 09/08/2018   Arthropathy of right shoulder 09/08/2018   Controlled  substance agreement signed 09/08/2018   Myofascial pain syndrome 08/24/2018   Routine physical examination 08/24/2018   Iron deficiency anemia 08/24/2018   Elevated liver enzymes 07/19/2018   Vitamin D deficiency 07/19/2018   Colon cancer screening    Abdominal pain, epigastric 07/08/2018   Right shoulder pain 06/15/2018   Hemorrhoids 05/01/2017   Anxiety and depression 11/24/2016   Lumbar pain with radiation down left leg 09/11/2016   Large breasts 09/11/2016   Parotiditis 05/01/2016    Abnormal laboratory test result 03/26/2016   Proteinuria 03/26/2016   Right hip pain 04/18/2015   Sinusitis 01/08/2014   Psoriatic arthritis (HCC) 09/13/2013   DUB (dysfunctional uterine bleeding) 10/27/2012   Gastritis due to nonsteroidal anti-inflammatory drug 12/12/2011   Anaphylactic reaction due to shellfish 09/27/2010   THYROMEGALY 09/03/2010   ALLERGIC RHINITIS 03/15/2010   MIGRAINE [name] 05/17/2008   PCP: Rennie Plowman FNP  REFERRING PROVIDER: Cassell Smiles MD  REFERRING DIAG: [phone] (ICD-10-CM) - Unilateral primary osteoarthritis, left hip   Rationale for Evaluation and Treatment: Rehabilitation  THERAPY DIAG: Pain in left hip  Muscle weakness (generalized)  ONSET DATE: 09/02/2015 (approximate)  FOLLOW-UP APPT SCHEDULED WITH REFERRING PROVIDER: Yes   FROM INITIAL EVALUATION SUBJECTIVE:                                                                                                                                                                                         SUBJECTIVE STATEMENT: L anterior approach THA on 01/28/23  PERTINENT HISTORY:  Pt underwent L anterior approach THA on 01/28/23. She has struggled with pain control since surgery but otherwise no reported complications. She is using compressive icing 2x/day and performing ankle pumps.  History from 07/08/22 Pt reports a diagnosis of Lyme Disease in 2017 with persistent widespread chronic joint pain after treatment.  Of note she has severe L hip pain as well as pain in her L knee and right shoulder. Her L hip pain has been worsening recently. She denies any pain in her R hip or L shoulder. She was previously receiving L hip injections from Dr. Martha Clan however she reports that he refused to continue with further injections so she was referred by pain management to Dr. Ashley Royalty. She saw Dr. Ashley Royalty and pt received a L hip intraarticular injection as well as an injection in her L  SIJ and L greater trochanter. The injections have helped with her pain. She was also referred to an orthopedic surgeon to discuss THR and referred for physical therapy. Pt expresses that she really enjoys exercising but is very limited by her pain.   PAIN:  Pain Intensity:  Present: 8/10, Best: 7/10, Worst: 10/10 Pain location: Anterior L hip, L knee and into the calf; Pain Quality: constant  Radiating: Yes, down the posterior L thigh Numbness/Tingling: Yes, slight anterior/lateral hip numbness Focal Weakness: Yes, L hip weakness; Position of comfort: laying down (supine) Aggravating factors: extended standing, extended sitting, walking; Relieving factors: laying on back, body pillow, ice, pain medications; 24-hour pain behavior: worse at the end of the day History of prior back injury, pain, surgery, or therapy: Yes, history of chronic L hip pain; Dominant hand: right Imaging: Yes, IMPRESSION: 1. 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 2. Moderate pubic symphysis osteoarthritis. Red flags: Negative for bowel/bladder changes, saddle paresthesia, personal history of cancer, h/o spinal tumors, h/o compression fx, h/o abdominal aneurysm, abdominal pain, chills/fever, night sweats, nausea, vomiting, unrelenting pain,   PRECAUTIONS: Anterior hip  WEIGHT BEARING RESTRICTIONS: No  FALLS: Has patient fallen in last 6 months? No  Living Environment Lives with: lives with their spouse Lives in: House/apartment, one step to enter from the garage Stairs: Yes: Internal: 12 steps; on right going up Has following equipment at home: Single point cane, Walker - 2 wheeled, and Shower bench  Prior level of function: Independent  Occupational demands: works at [employer], stands for 8-10 hour shifts  Hobbies: Exercising;  Patient Goals: Return to exercising.   OBJECTIVE:   Patient Surveys  LEFS to be completed FOTO 1, predicted improvement to  59  Cognition Patient is oriented to person, place, and time.  Recent memory is intact.  Remote memory is intact.  Attention span and concentration are intact.  Expressive speech is intact.  Patient's fund of knowledge is within normal limits for educational level.    Gross Musculoskeletal Assessment Tremor: None Bulk: Normal Tone: Normal Area around bandage is dry without signs of erythema, edema, or ecchymosis. Dried blood on bandage but not excessive. Superior margin of bandage is peeling off and pt plans to go to ortho for a clean dressing after therapy session.  GAIT: Antalgic gait on LLE with L hip externally rotated during gait. Significantly decreased self-selected gait speed. Full gait assessment deferred  Posture: Lumbar lordosis: WNL Iliac crest height: Equal bilaterally Lumbar lateral shift: Negative  AROM AROM (Normal range in degrees) AROM   Hip Right Left  Flexion (125) WNL At least 90 degrees. Pain limited AROM for all AROM  Extension (15)    Abduction (40)    Adduction     Internal Rotation (45)    External Rotation (45)        Knee    Flexion (135) WNL WNL  Extension (0) 0 0  (* = pain; Blank rows = not tested)  LE MMT: MMT (out of 5) Right  Left   Hip flexion 5 <3  Hip extension    Hip abduction (seated) 4+ 3+*  Hip adduction 4+ 4*  Hip internal rotation    Hip external rotation    Knee flexion (seated) 4+ 4+  Knee extension 5 5  Ankle dorsiflexion 5 5  Ankle plantarflexion    Ankle inversion    Ankle eversion    (* = pain; Blank rows = not tested)  Sensation Intact and symmetrical to light touch throughout BLE L2-S2. Hot/cold and proprioception deferred;  Muscle Length Hamstrings: R: Positive for shortening around 70 degrees L: Positive for shortening around 70 degrees [place] (quadriceps): R: Not examined L: Not examined Thomas (hip flexors): R: Not examined L: Not examined Ober: R: Not examined  L: Not examined  Functional Tests TUG:  29.6s 5TSTS: 16.2s   TREATMENT   SUBJECTIVE: Pt reports that she is doing alright today.  I have a lot of swelling in my hip for which   Her L knee has remained painful. She reports continued swelling in her L hip but no warmth, redness, or discharge from the incision. She saw Dr Odis Luster who asked her to return next week for a recheck of the L hip incision and to perform radiographs of her L knee. She asked him about her leg length discrepancy and per pt he advised her that he felt like it would improve as the swelling decreased in her L hip. She is performing her HEP without issue. Pt continues ambulating with a single-point cane and also without an assistive device.    PAIN: L hip (at site of incision) and L knee;   Ther-ex Ant. Hip Precaution maintained through out the session.   NuStep L0-6 x 10:00 minutes for warm-up, BLE strengthening, and while obtaining an interval history;  Quad stretch in prone within in pain free rage 2 x 60 secs  Heel lift provided for RLE, pt ambulates in the gym and reports improvement in her L knee pain and gait mechanics;  Qaud stretch in prone 2 x 60 secs with strong pull sensation only.  Total Gym Level 22 double leg squats 2 x 10; Prone Hip ext isometrics 3 secs hold Hamstring curls 2 x 10 reps Supine LLE heel slides with resisted extension 2 x 10; Supine LLE hip bridges with abduction with manual resistance 2 x 10; SL clams with  Pillow between knees manual resistance 2 x 10; Hooklying  with adductor squeeze with manual resistance 2 x 10; STS with ABD manual resistance 2 x 60 secs    Not performed: Side stepping with 2# AW x multiple laps; Forward 6" step-ups with BUE support leading with LLE x 10; L lateral 6" step-ups with BUE support x 10; Standing mini squats x 10; Standing heel raises x 20; Seated LAQ with 3# AW x 10 BLE; Seated clams with manual resistance 2 x 10 BLE; Seated adductor squeeze with manual resistance 2 x 10 BLE;  Standing  exercises with 3# ankle weights (AW): Hip flexion marches x 10 BLE; Standing hip abduction x 10 BLE; Hamstring curls x 10 BLE;   PATIENT EDUCATION:  Education details: Pt educated throughout session about proper posture and technique with exercises. Improved exercise technique, movement at target joints, use of target muscles after min to mod verbal, visual, tactile cues. Heel lift Person educated: Patient and Spouse Education method: Explanation, Demonstration, Verbal cues, and Handouts Education comprehension: verbalized understanding, returned demonstration, and verbal cues required   HOME EXERCISE PROGRAM:  Access Code: [email or web address] URL: [email or web address] Date: 02/02/2023 Prepared by: Ria Comment  Exercises - Supine Ankle Pumps  - 2 x daily - 7 x weekly - 2 sets - 10 reps - Supine Quad Set  - 2 x daily - 7 x weekly - 2 sets - 10 reps - 3s hold - Supine Gluteal Sets  - 2 x daily - 7 x weekly - 2 sets - 10 reps - 3s hold - Supine Hip Abduction  - 2 x daily - 7 x weekly - 2 sets - 10 reps - Supine Heel Slide (Mirrored)  - 2 x daily - 7 x weekly - 2 sets - 10 reps - Seated Long Arc Quad (Mirrored)  - 2 x daily - 7 x  weekly - 2 sets - 10 reps - 3s hold   ASSESSMENT:  CLINICAL IMPRESSION: Continued strengthening with patient during session today. Inserted RLE heel lift to account for LLD which improves gait mechanics and decreases her L knee pain. PT advised pt to discuss the insert with Orthopedic surgeon when she meets him on 03/02/2023.  PT continued  to progress strengthening at future sessions while maintaining the Ant. Hip precaution, Pt is on steroid for swelling. Pt demonstrates weakness and gait deviations.  . Pt encouraged to continue HEP and follow-up as scheduled.  Encouraged regular icing of left hip if she continues to experience swelling. She will benefit from PT services to address deficits in strength, mobility, and pain in order to return to full function  at home.    OBJECTIVE IMPAIRMENTS: Abnormal gait, difficulty walking, decreased ROM, decreased strength, and pain.   ACTIVITY LIMITATIONS: lifting, bending, sitting, standing, squatting, sleeping, stairs, transfers, and bathing  PARTICIPATION LIMITATIONS: cleaning, shopping, community activity, and occupation  PERSONAL FACTORS: Past/current experiences, Profession, Time since onset of injury/illness/exacerbation, and 3+ comorbidities: depression, anxiety, migraines, chronic pain, and psoriatic arthritis  are also affecting patient's functional outcome.   REHAB POTENTIAL: Excellent  CLINICAL DECISION MAKING: Unstable/unpredictable  EVALUATION COMPLEXITY: High   GOALS: Goals reviewed with patient? No  SHORT TERM GOALS: Target date: 03/02/2023   Pt will be independent with HEP in order to improve strength and range of motion as well as decrease hip pain to improve function at home and work. Baseline:  Goal status: INITIAL   LONG TERM GOALS: Target date: 03/30/2023   Pt will increase FOTO to at least 39 to demonstrate significant improvement in function at home and work related to L hip pain  Baseline: 02/02/23: 1 Goal status: INITIAL  2.  Pt will decrease worst hip pain by at least 2 points on the NPRS in order to demonstrate clinically significant reduction in hip pain. Baseline: 02/02/23: worst: 10/10 Goal status: INITIAL  3.  Pt will decrease TUG to below 14 seconds in order to demonstrate decreased fall risk.      Baseline: 02/02/23: 29.6s; Goal status: INITIAL  4.  Pt will decrease 5TSTS by at least 3 seconds in order to demonstrate clinically significant improvement in LE strength       Baseline: 02/02/23: 16.2s; Goal status: INITIAL  5.  Pt will increase pain-free strength of L hip in all directions to at least 4/5 in order to demonstrate improvement in strength and function  Baseline: 02/02/23: see above; Goal status: INITIAL  6. Pt will increase by at least 112.5' in  order to demonstrate clinically significant improvement in cardiopulmonary endurance and community ambulation  Baseline: 02/05/23: 234' with RW; Goal status: INITIAL  7. Pt will increase self-selected 60m gait speed by at least 0.13 m/s in order to demonstrate clinically significant improvement in community ambulation.   Baseline: 02/05/23: self-selected: 23.6s = 0.42 m/s Goal status: INITIAL  PLAN: PT FREQUENCY: 1-2x/week  PT DURATION: 8 weeks  PLANNED INTERVENTIONS: Therapeutic exercises, Therapeutic activity, Neuromuscular re-education, Balance training, Gait training, Patient/Family education, Self Care, Joint mobilization, Joint manipulation, Vestibular training, Canalith repositioning, Orthotic/Fit training, DME instructions, Dry Needling, Electrical stimulation, Spinal manipulation, Spinal mobilization, Cryotherapy, Moist heat, Taping, Traction, Ultrasound, Ionotophoresis 4mg /ml Dexamethasone, Manual therapy, and Re-evaluation.  PLAN FOR NEXT SESSION: progress strengthening, review/modify HEP as necessary;    Janet Berlin PT DPT 1:22 PM,02/25/23

## 2023-03-02 ENCOUNTER — Ambulatory Visit: Payer: Federal, State, Local not specified - PPO | Attending: Orthopedic Surgery

## 2023-03-02 DIAGNOSIS — M25552 Pain in left hip: Secondary | ICD-10-CM | POA: Insufficient documentation

## 2023-03-02 DIAGNOSIS — M6281 Muscle weakness (generalized): Secondary | ICD-10-CM | POA: Insufficient documentation

## 2023-03-02 NOTE — Therapy (Signed)
OUTPATIENT PHYSICAL THERAPY HIP TREATMENT  Patient Name: Barbara Faulkner MRN: [id number] DOB:October 04, 1965, 57 y.o., female Today's Date: 03/02/2023   PT End of Session - 03/02/23 1016     Visit Number 8    Number of Visits 17    Date for PT Re-Evaluation 03/30/23    Authorization Type eval: 02/02/23    Authorization Time Period [employer] 2024 VL:75 combined visits per year    Authorization - Visit Number 21    Authorization - Number of Visits 75    PT Start Time 1016    PT Stop Time 1100    PT Time Calculation (min) 44 min    Activity Tolerance Patient tolerated treatment well    Behavior During Therapy [facility] for tasks assessed/performed            Past Medical History:  Diagnosis Date   Allergy    Anxiety    Chronic kidney disease    Chronic pain    Chronic sinusitis    COVID-19    09/14/20, 06/13/21   Depression    Heart murmur    Hiatal hernia    HPV (human papilloma virus) anogenital infection    Macromastia    Migraine    Psoriatic arthritis (HCC)    Shingles    UTI (urinary tract infection)    ecoli 09/2021   Past Surgical History:  Procedure Laterality Date   BREAST BIOPSY Right 01/28/2018   US guided biopsy - heart shaped   BREAST REDUCTION SURGERY Bilateral 04/03/2020   Procedure: MAMMARY REDUCTION  (BREAST);  Surgeon: Contogiannis, Chales Abrahams, MD;  Location: [facility];  Service: Plastics;  Laterality: Bilateral;  HAVE LIPOSUCTION MACHINE AVAILABLE   CHOLECYSTECTOMY  2009   COLONOSCOPY WITH PROPOFOL N/A 07/19/2018   Procedure: COLONOSCOPY WITH PROPOFOL;  Surgeon: Toney Reil, MD;  Location: [facility] ENDOSCOPY;  Service: Gastroenterology;  Laterality: N/A;   COLONOSCOPY WITH PROPOFOL N/A 09/17/2021   Procedure: COLONOSCOPY WITH PROPOFOL;  Surgeon: Toney Reil, MD;  Location: [facility] ENDOSCOPY;  Service: Gastroenterology;  Laterality: N/A;   COLONOSCOPY WITH PROPOFOL N/A 09/18/2021   Procedure: COLONOSCOPY WITH PROPOFOL;  Surgeon: Toney Reil, MD;  Location: [facility] ENDOSCOPY;  Service: Gastroenterology;  Laterality: N/A;   ESOPHAGOGASTRODUODENOSCOPY (EGD) WITH PROPOFOL N/A 07/19/2018   Procedure: ESOPHAGOGASTRODUODENOSCOPY (EGD) WITH PROPOFOL;  Surgeon: Toney Reil, MD;  Location: [facility] ENDOSCOPY;  Service: Gastroenterology;  Laterality: N/A;   GASTRIC BYPASS     2015; duodenal switch    LAPAROSCOPIC GASTRIC BANDING  2008   removed 2009   REDUCTION MAMMAPLASTY     TUBAL LIGATION  1997   Patient Active Problem List   Diagnosis Date Noted   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) 10/21/2022   Lumbar radicular pain 10/01/2022   Fibromyalgia 10/01/2022   Normocytic anemia 06/13/2022   Elevated alkaline phosphatase level 05/28/2022   Chills 05/26/2022   Chronic left SI joint pain 11/13/2021   Piriformis syndrome, left 11/13/2021   Greater 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 11/13/2021   Primary osteoarthritis of right hip 09/16/2021   Plantar fasciitis 08/22/2021   Carpal tunnel syndrome on right 08/22/2021   Primary osteoarthritis of left hip 08/15/2021   Hyperphosphatemia 08/01/2021   Anemia in chronic kidney disease 04/18/2021   Chronic kidney disease, stage 3a (HCC) 04/18/2021   Hyperparathyroidism due to renal insufficiency (HCC) 04/18/2021   Hypertension 04/18/2021   Hiatal hernia 11/02/2020   History of migraine 11/02/2020   Gastroesophageal  reflux disease 11/02/2020   Brain fog 11/02/2020   Depression, recurrent (HCC) 03/18/2020   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 09/08/2018   Arthropathy of right shoulder 09/08/2018   Controlled substance agreement signed 09/08/2018   Myofascial pain syndrome 08/24/2018   Routine physical examination 08/24/2018   Iron deficiency anemia 08/24/2018   Elevated liver enzymes 07/19/2018   Vitamin D deficiency 07/19/2018   Colon cancer screening    Abdominal pain, epigastric 07/08/2018   Right shoulder pain 06/15/2018   Hemorrhoids  05/01/2017   Anxiety and depression 11/24/2016   Lumbar pain with radiation down left leg 09/11/2016   Large breasts 09/11/2016   Parotiditis 05/01/2016   Abnormal laboratory test result 03/26/2016   Proteinuria 03/26/2016   Right hip pain 04/18/2015   Sinusitis 01/08/2014   Psoriatic arthritis (HCC) 09/13/2013   DUB (dysfunctional uterine bleeding) 10/27/2012   Gastritis due to nonsteroidal anti-inflammatory drug 12/12/2011   Anaphylactic reaction due to shellfish 09/27/2010   THYROMEGALY 09/03/2010   ALLERGIC RHINITIS 03/15/2010   MIGRAINE [name] 05/17/2008   PCP: Rennie Plowman FNP  REFERRING PROVIDER: Cassell Smiles MD  REFERRING DIAG: [phone] (ICD-10-CM) - Unilateral primary osteoarthritis, left hip   Rationale for Evaluation and Treatment: Rehabilitation  THERAPY DIAG: Pain in left hip  Muscle weakness (generalized)  ONSET DATE: 09/02/2015 (approximate)  FOLLOW-UP APPT SCHEDULED WITH REFERRING PROVIDER: Yes   FROM INITIAL EVALUATION SUBJECTIVE:                                                                                                                                                                                         SUBJECTIVE STATEMENT: L anterior approach THA on 01/28/23  PERTINENT HISTORY:  Pt underwent L anterior approach THA on 01/28/23. She has struggled with pain control since surgery but otherwise no reported complications. She is using compressive icing 2x/day and performing ankle pumps.  History from 07/08/22 Pt reports a diagnosis of Lyme Disease in 2017 with persistent widespread chronic joint pain after treatment.  Of note she has severe L hip pain as well as pain in her L knee and right shoulder. Her L hip pain has been worsening recently. She denies any pain in her R hip or L shoulder. She was previously receiving L hip injections from Dr. Martha Clan however she reports that he refused to continue with further injections  so she was referred by pain management to Dr. Ashley Royalty. She saw Dr. Ashley Royalty and pt received a L hip intraarticular injection as well as an injection in her L SIJ and L greater trochanter. The injections have helped with her pain. She was also  referred to an orthopedic surgeon to discuss THR and referred for physical therapy. Pt expresses that she really enjoys exercising but is very limited by her pain.   PAIN:  Pain Intensity: Present: 8/10, Best: 7/10, Worst: 10/10 Pain location: Anterior L hip, L knee and into the calf; Pain Quality: constant  Radiating: Yes, down the posterior L thigh Numbness/Tingling: Yes, slight anterior/lateral hip numbness Focal Weakness: Yes, L hip weakness; Position of comfort: laying down (supine) Aggravating factors: extended standing, extended sitting, walking; Relieving factors: laying on back, body pillow, ice, pain medications; 24-hour pain behavior: worse at the end of the day History of prior back injury, pain, surgery, or therapy: Yes, history of chronic L hip pain; Dominant hand: right Imaging: Yes, IMPRESSION: 1. 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 2. Moderate pubic symphysis osteoarthritis. Red flags: Negative for bowel/bladder changes, saddle paresthesia, personal history of cancer, h/o spinal tumors, h/o compression fx, h/o abdominal aneurysm, abdominal pain, chills/fever, night sweats, nausea, vomiting, unrelenting pain,   PRECAUTIONS: Anterior hip  WEIGHT BEARING RESTRICTIONS: No  FALLS: Has patient fallen in last 6 months? No  Living Environment Lives with: lives with their spouse Lives in: House/apartment, one step to enter from the garage Stairs: Yes: Internal: 12 steps; on right going up Has following equipment at home: Single point cane, Walker - 2 wheeled, and Shower bench  Prior level of function: Independent  Occupational demands: works at [employer], stands for 8-10 hour  shifts  Hobbies: Exercising;  Patient Goals: Return to exercising.   OBJECTIVE:   Patient Surveys  LEFS to be completed FOTO 1, predicted improvement to 73  Cognition Patient is oriented to person, place, and time.  Recent memory is intact.  Remote memory is intact.  Attention span and concentration are intact.  Expressive speech is intact.  Patient's fund of knowledge is within normal limits for educational level.    Gross Musculoskeletal Assessment Tremor: None Bulk: Normal Tone: Normal Area around bandage is dry without signs of erythema, edema, or ecchymosis. Dried blood on bandage but not excessive. Superior margin of bandage is peeling off and pt plans to go to ortho for a clean dressing after therapy session.  GAIT: Antalgic gait on LLE with L hip externally rotated during gait. Significantly decreased self-selected gait speed. Full gait assessment deferred  Posture: Lumbar lordosis: WNL Iliac crest height: Equal bilaterally Lumbar lateral shift: Negative  AROM AROM (Normal range in degrees) AROM   Hip Right Left  Flexion (125) WNL At least 90 degrees. Pain limited AROM for all AROM  Extension (15)    Abduction (40)    Adduction     Internal Rotation (45)    External Rotation (45)        Knee    Flexion (135) WNL WNL  Extension (0) 0 0  (* = pain; Blank rows = not tested)  LE MMT: MMT (out of 5) Right  Left   Hip flexion 5 <3  Hip extension    Hip abduction (seated) 4+ 3+*  Hip adduction 4+ 4*  Hip internal rotation    Hip external rotation    Knee flexion (seated) 4+ 4+  Knee extension 5 5  Ankle dorsiflexion 5 5  Ankle plantarflexion    Ankle inversion    Ankle eversion    (* = pain; Blank rows = not tested)  Sensation Intact and symmetrical to light touch throughout BLE L2-S2. Hot/cold and proprioception deferred;  Muscle Length Hamstrings: R: Positive  for shortening around 70 degrees L: Positive for shortening around 70 degrees [place]  (quadriceps): R: Not examined L: Not examined Thomas (hip flexors): R: Not examined L: Not examined [place]: R: Not examined L: Not examined  Functional Tests TUG: 29.6s 5TSTS: 16.2s   TREATMENT   SUBJECTIVE: Pt reports that she is doing alright today. She just left from Dr. Odis Luster office and he put her on an oral steroid to help with the L hip swelling and L knee pain. Per pt there are no concerns currently about her incision and it has now fully closed. She is performing her HEP without issue. Pt continues ambulating with a single-point cane occasionally but arrives today without an assistive device.    PAIN: No L hip pain but some "stiffness," Persistent L knee pain;   Ther-ex  NuStep L0-6 x 10:00 minutes for warm-up, BLE strengthening, and while obtaining an interval history;   Hooklying LLE SLR x 10; Hooklying manually resisted clams x 10 BLE; Hooklying manually resisted adductor squeeze x 10 BLE; Supine LLE heel slides with resisted extension x 10; Supine LLE straight leg manually resisted hip abduction/adduction x 10 each;  Standing mini squats x 10; Standing heel raises x 10; Seated LAQ with 4# AW x 10 BLE;;  Standing exercises with 4# ankle weights (AW): Hip flexion marches x 10 BLE; Hip abduction x 10 BLE; Hamstring curls x 10 BLE; Hip extension x 10 BLE;   Manual Therapy  Theraband roller to anterior/lateral L hip;   Not performed: Side stepping with 2# AW x multiple laps; Forward 6" step-ups with BUE support leading with LLE x 10; L lateral 6" step-ups with BUE support x 10;   PATIENT EDUCATION:  Education details: Pt educated throughout session about proper posture and technique with exercises. Improved exercise technique, movement at target joints, use of target muscles after min to mod verbal, visual, tactile cues. Heel lift Person educated: Patient and Spouse Education method: Explanation, Demonstration, Verbal cues, and Handouts Education  comprehension: verbalized understanding, returned demonstration, and verbal cues required   HOME EXERCISE PROGRAM:  Access Code: [email or web address] URL: [email or web address] Date: 02/02/2023 Prepared by: Ria Comment  Exercises - Supine Ankle Pumps  - 2 x daily - 7 x weekly - 2 sets - 10 reps - Supine Quad Set  - 2 x daily - 7 x weekly - 2 sets - 10 reps - 3s hold - Supine Gluteal Sets  - 2 x daily - 7 x weekly - 2 sets - 10 reps - 3s hold - Supine Hip Abduction  - 2 x daily - 7 x weekly - 2 sets - 10 reps - Supine Heel Slide (Mirrored)  - 2 x daily - 7 x weekly - 2 sets - 10 reps - Seated Long Arc Quad (Mirrored)  - 2 x daily - 7 x weekly - 2 sets - 10 reps - 3s hold   ASSESSMENT:  CLINICAL IMPRESSION: Continued strengthening with patient during session today. She denies any pain in L hip at rest or during exercise today. Increased ankle weights during exercises today. Plan  to progress strengthening at future sessions. Pt encouraged to continue HEP and follow-up as scheduled. Encouraged regular icing of left hip if she continues to experience swelling. She will benefit from PT services to address deficits in strength, mobility, and pain in order to return to full function at home.    OBJECTIVE IMPAIRMENTS: Abnormal gait, difficulty walking, decreased ROM, decreased strength, and pain.   ACTIVITY LIMITATIONS:  lifting, bending, sitting, standing, squatting, sleeping, stairs, transfers, and bathing  PARTICIPATION LIMITATIONS: cleaning, shopping, community activity, and occupation  PERSONAL FACTORS: Past/current experiences, Profession, Time since onset of injury/illness/exacerbation, and 3+ comorbidities: depression, anxiety, migraines, chronic pain, and psoriatic arthritis  are also affecting patient's functional outcome.   REHAB POTENTIAL: Excellent  CLINICAL DECISION MAKING: Unstable/unpredictable  EVALUATION COMPLEXITY: High   GOALS: Goals reviewed with patient?  No  SHORT TERM GOALS: Target date: 03/02/2023   Pt will be independent with HEP in order to improve strength and range of motion as well as decrease hip pain to improve function at home and work. Baseline:  Goal status: INITIAL   LONG TERM GOALS: Target date: 03/30/2023   Pt will increase FOTO to at least 39 to demonstrate significant improvement in function at home and work related to L hip pain  Baseline: 02/02/23: 1 Goal status: INITIAL  2.  Pt will decrease worst hip pain by at least 2 points on the NPRS in order to demonstrate clinically significant reduction in hip pain. Baseline: 02/02/23: worst: 10/10 Goal status: INITIAL  3.  Pt will decrease TUG to below 14 seconds in order to demonstrate decreased fall risk.      Baseline: 02/02/23: 29.6s; Goal status: INITIAL  4.  Pt will decrease 5TSTS by at least 3 seconds in order to demonstrate clinically significant improvement in LE strength       Baseline: 02/02/23: 16.2s; Goal status: INITIAL  5.  Pt will increase pain-free strength of L hip in all directions to at least 4/5 in order to demonstrate improvement in strength and function  Baseline: 02/02/23: see above; Goal status: INITIAL  6. Pt will increase by at least 112.5' in order to demonstrate clinically significant improvement in cardiopulmonary endurance and community ambulation  Baseline: 02/05/23: 234' with RW; Goal status: INITIAL  7. Pt will increase self-selected 46m gait speed by at least 0.13 m/s in order to demonstrate clinically significant improvement in community ambulation.   Baseline: 02/05/23: self-selected: 23.6s = 0.42 m/s Goal status: INITIAL  PLAN: PT FREQUENCY: 1-2x/week  PT DURATION: 8 weeks  PLANNED INTERVENTIONS: Therapeutic exercises, Therapeutic activity, Neuromuscular re-education, Balance training, Gait training, Patient/Family education, Self Care, Joint mobilization, Joint manipulation, Vestibular training, Canalith repositioning, Orthotic/Fit  training, DME instructions, Dry Needling, Electrical stimulation, Spinal manipulation, Spinal mobilization, Cryotherapy, Moist heat, Taping, Traction, Ultrasound, Ionotophoresis 4mg /ml Dexamethasone, Manual therapy, and Re-evaluation.  PLAN FOR NEXT SESSION: progress strengthening, review/modify HEP as necessary;   Sharalyn Ink Cristo Ausburn PT, DPT, GCS  10:08 PM,03/02/23

## 2023-03-03 ENCOUNTER — Encounter: Payer: Self-pay | Admitting: Family Medicine

## 2023-03-04 ENCOUNTER — Ambulatory Visit: Payer: Federal, State, Local not specified - PPO

## 2023-03-04 DIAGNOSIS — M25552 Pain in left hip: Secondary | ICD-10-CM

## 2023-03-04 DIAGNOSIS — M6281 Muscle weakness (generalized): Secondary | ICD-10-CM | POA: Diagnosis not present

## 2023-03-04 NOTE — Therapy (Signed)
OUTPATIENT PHYSICAL THERAPY HIP TREATMENT  Patient Name: Barbara Faulkner MRN: [id number] DOB:February 27, 1966, 57 y.o., female Today's Date: 03/06/2023   PT End of Session - 03/06/23 1326     Visit Number 9    Number of Visits 17    Date for PT Re-Evaluation 03/30/23    Authorization Type eval: 02/02/23    Authorization Time Period [employer] 2024 VL:75 combined visits per year    Authorization - Visit Number 22    Authorization - Number of Visits 75    PT Start Time 1409    PT Stop Time 1445    PT Time Calculation (min) 36 min    Activity Tolerance Patient tolerated treatment well    Behavior During Therapy [facility] for tasks assessed/performed            Past Medical History:  Diagnosis Date   Allergy    Anxiety    Chronic kidney disease    Chronic pain    Chronic sinusitis    COVID-19    09/14/20, 06/13/21   Depression    Heart murmur    Hiatal hernia    HPV (human papilloma virus) anogenital infection    Macromastia    Migraine    Psoriatic arthritis (HCC)    Shingles    UTI (urinary tract infection)    ecoli 09/2021   Past Surgical History:  Procedure Laterality Date   BREAST BIOPSY Right 01/28/2018   US guided biopsy - heart shaped   BREAST REDUCTION SURGERY Bilateral 04/03/2020   Procedure: MAMMARY REDUCTION  (BREAST);  Surgeon: Contogiannis, Chales Abrahams, MD;  Location: [facility];  Service: Plastics;  Laterality: Bilateral;  HAVE LIPOSUCTION MACHINE AVAILABLE   CHOLECYSTECTOMY  2009   COLONOSCOPY WITH PROPOFOL N/A 07/19/2018   Procedure: COLONOSCOPY WITH PROPOFOL;  Surgeon: Toney Reil, MD;  Location: [facility] ENDOSCOPY;  Service: Gastroenterology;  Laterality: N/A;   COLONOSCOPY WITH PROPOFOL N/A 09/17/2021   Procedure: COLONOSCOPY WITH PROPOFOL;  Surgeon: Toney Reil, MD;  Location: [facility] ENDOSCOPY;  Service: Gastroenterology;  Laterality: N/A;   COLONOSCOPY WITH PROPOFOL N/A 09/18/2021   Procedure: COLONOSCOPY WITH PROPOFOL;  Surgeon: Toney Reil, MD;  Location: [facility] ENDOSCOPY;  Service: Gastroenterology;  Laterality: N/A;   ESOPHAGOGASTRODUODENOSCOPY (EGD) WITH PROPOFOL N/A 07/19/2018   Procedure: ESOPHAGOGASTRODUODENOSCOPY (EGD) WITH PROPOFOL;  Surgeon: Toney Reil, MD;  Location: [facility] ENDOSCOPY;  Service: Gastroenterology;  Laterality: N/A;   GASTRIC BYPASS     2015; duodenal switch    LAPAROSCOPIC GASTRIC BANDING  2008   removed 2009   REDUCTION MAMMAPLASTY     TUBAL LIGATION  1997   Patient Active Problem List   Diagnosis Date Noted   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) 10/21/2022   Lumbar radicular pain 10/01/2022   Fibromyalgia 10/01/2022   Normocytic anemia 06/13/2022   Elevated alkaline phosphatase level 05/28/2022   Chills 05/26/2022   Chronic left SI joint pain 11/13/2021   Piriformis syndrome, left 11/13/2021   Greater 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 11/13/2021   Primary osteoarthritis of right hip 09/16/2021   Plantar fasciitis 08/22/2021   Carpal tunnel syndrome on right 08/22/2021   Primary osteoarthritis of left hip 08/15/2021   Hyperphosphatemia 08/01/2021   Anemia in chronic kidney disease 04/18/2021   Chronic kidney disease, stage 3a (HCC) 04/18/2021   Hyperparathyroidism due to renal insufficiency (HCC) 04/18/2021   Hypertension 04/18/2021   Hiatal hernia 11/02/2020   History of migraine 11/02/2020   Gastroesophageal  reflux disease 11/02/2020   Brain fog 11/02/2020   Depression, recurrent (HCC) 03/18/2020   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 09/08/2018   Arthropathy of right shoulder 09/08/2018   Controlled substance agreement signed 09/08/2018   Myofascial pain syndrome 08/24/2018   Routine physical examination 08/24/2018   Iron deficiency anemia 08/24/2018   Elevated liver enzymes 07/19/2018   Vitamin D deficiency 07/19/2018   Colon cancer screening    Abdominal pain, epigastric 07/08/2018   Right shoulder pain 06/15/2018   Hemorrhoids  05/01/2017   Anxiety and depression 11/24/2016   Lumbar pain with radiation down left leg 09/11/2016   Large breasts 09/11/2016   Parotiditis 05/01/2016   Abnormal laboratory test result 03/26/2016   Proteinuria 03/26/2016   Right hip pain 04/18/2015   Sinusitis 01/08/2014   Psoriatic arthritis (HCC) 09/13/2013   DUB (dysfunctional uterine bleeding) 10/27/2012   Gastritis due to nonsteroidal anti-inflammatory drug 12/12/2011   Anaphylactic reaction due to shellfish 09/27/2010   THYROMEGALY 09/03/2010   ALLERGIC RHINITIS 03/15/2010   MIGRAINE [name] 05/17/2008   PCP: Rennie Plowman FNP  REFERRING PROVIDER: Cassell Smiles MD  REFERRING DIAG: [phone] (ICD-10-CM) - Unilateral primary osteoarthritis, left hip   Rationale for Evaluation and Treatment: Rehabilitation  THERAPY DIAG: Pain in left hip  Muscle weakness (generalized)  ONSET DATE: 09/02/2015 (approximate)  FOLLOW-UP APPT SCHEDULED WITH REFERRING PROVIDER: Yes   FROM INITIAL EVALUATION SUBJECTIVE:                                                                                                                                                                                         SUBJECTIVE STATEMENT: L anterior approach THA on 01/28/23  PERTINENT HISTORY:  Pt underwent L anterior approach THA on 01/28/23. She has struggled with pain control since surgery but otherwise no reported complications. She is using compressive icing 2x/day and performing ankle pumps.  History from 07/08/22 Pt reports a diagnosis of Lyme Disease in 2017 with persistent widespread chronic joint pain after treatment.  Of note she has severe L hip pain as well as pain in her L knee and right shoulder. Her L hip pain has been worsening recently. She denies any pain in her R hip or L shoulder. She was previously receiving L hip injections from Dr. Martha Clan however she reports that he refused to continue with further injections  so she was referred by pain management to Dr. Ashley Royalty. She saw Dr. Ashley Royalty and pt received a L hip intraarticular injection as well as an injection in her L SIJ and L greater trochanter. The injections have helped with her pain. She was also  referred to an orthopedic surgeon to discuss THR and referred for physical therapy. Pt expresses that she really enjoys exercising but is very limited by her pain.   PAIN:  Pain Intensity: Present: 8/10, Best: 7/10, Worst: 10/10 Pain location: Anterior L hip, L knee and into the calf; Pain Quality: constant  Radiating: Yes, down the posterior L thigh Numbness/Tingling: Yes, slight anterior/lateral hip numbness Focal Weakness: Yes, L hip weakness; Position of comfort: laying down (supine) Aggravating factors: extended standing, extended sitting, walking; Relieving factors: laying on back, body pillow, ice, pain medications; 24-hour pain behavior: worse at the end of the day History of prior back injury, pain, surgery, or therapy: Yes, history of chronic L hip pain; Dominant hand: right Imaging: Yes, IMPRESSION: 1. 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 2. Moderate pubic symphysis osteoarthritis. Red flags: Negative for bowel/bladder changes, saddle paresthesia, personal history of cancer, h/o spinal tumors, h/o compression fx, h/o abdominal aneurysm, abdominal pain, chills/fever, night sweats, nausea, vomiting, unrelenting pain,   PRECAUTIONS: Anterior hip  WEIGHT BEARING RESTRICTIONS: No  FALLS: Has patient fallen in last 6 months? No  Living Environment Lives with: lives with their spouse Lives in: House/apartment, one step to enter from the garage Stairs: Yes: Internal: 12 steps; on right going up Has following equipment at home: Single point cane, Walker - 2 wheeled, and Shower bench  Prior level of function: Independent  Occupational demands: works at [employer], stands for 8-10 hour  shifts  Hobbies: Exercising;  Patient Goals: Return to exercising.   OBJECTIVE:   Patient Surveys  LEFS to be completed FOTO 1, predicted improvement to 55  Cognition Patient is oriented to person, place, and time.  Recent memory is intact.  Remote memory is intact.  Attention span and concentration are intact.  Expressive speech is intact.  Patient's fund of knowledge is within normal limits for educational level.    Gross Musculoskeletal Assessment Tremor: None Bulk: Normal Tone: Normal Area around bandage is dry without signs of erythema, edema, or ecchymosis. Dried blood on bandage but not excessive. Superior margin of bandage is peeling off and pt plans to go to ortho for a clean dressing after therapy session.  GAIT: Antalgic gait on LLE with L hip externally rotated during gait. Significantly decreased self-selected gait speed. Full gait assessment deferred  Posture: Lumbar lordosis: WNL Iliac crest height: Equal bilaterally Lumbar lateral shift: Negative  AROM AROM (Normal range in degrees) AROM   Hip Right Left  Flexion (125) WNL At least 90 degrees. Pain limited AROM for all AROM  Extension (15)    Abduction (40)    Adduction     Internal Rotation (45)    External Rotation (45)        Knee    Flexion (135) WNL WNL  Extension (0) 0 0  (* = pain; Blank rows = not tested)  LE MMT: MMT (out of 5) Right  Left   Hip flexion 5 <3  Hip extension    Hip abduction (seated) 4+ 3+*  Hip adduction 4+ 4*  Hip internal rotation    Hip external rotation    Knee flexion (seated) 4+ 4+  Knee extension 5 5  Ankle dorsiflexion 5 5  Ankle plantarflexion    Ankle inversion    Ankle eversion    (* = pain; Blank rows = not tested)  Sensation Intact and symmetrical to light touch throughout BLE L2-S2. Hot/cold and proprioception deferred;  Muscle Length Hamstrings: R: Positive  for shortening around 70 degrees L: Positive for shortening around 70 degrees [place]  (quadriceps): R: Not examined L: Not examined Thomas (hip flexors): R: Not examined L: Not examined [place]: R: Not examined L: Not examined  Functional Tests TUG: 29.6s 5TSTS: 16.2s   TREATMENT   SUBJECTIVE: Pt reports that she is doing alright today. Her L knee remains painful but she has not been having any issues in her hip. She is frustrated about her leg length discrepancy but overall very pleased with the pain-relief she has achieved from surgery. She is performing her HEP without issue. Pt arrives today without an assistive device.    PAIN: No L hip pain but some "stiffness," Persistent L knee pain;   Ther-ex  NuStep L0-6 x 10:00 minutes for warm-up, BLE strengthening, and while obtaining an interval history (5 minutes unbilled);  Total Gym (TG) Level 22 (L22) double leg squats 2 x 10; TG L22 single leg squats 2 x 10 BLE; TG L22 double leg heel raises 2 x 10; Hooklying LLE SLR x 10; Hooklying manually resisted clams x 10 BLE; Hooklying manually resisted adductor squeeze x 10 BLE; Supine LLE heel slides with resisted extension x 10; Supine LLE straight leg manually resisted hip abduction/adduction x 10 each;   Manual Therapy  Theraband roller to anterior/lateral L hip;   Not performed: Side stepping with 2# AW x multiple laps; Forward 6" step-ups with BUE support leading with LLE x 10; L lateral 6" step-ups with BUE support x 10; Standing mini squats x 10; Standing heel raises x 10; Seated LAQ with 4# AW x 10 BLE;; Standing exercises with 4# ankle weights (AW): Hip flexion marches x 10 BLE; Hip abduction x 10 BLE; Hamstring curls x 10 BLE; Hip extension x 10 BLE;   PATIENT EDUCATION:  Education details: Pt educated throughout session about proper posture and technique with exercises. Improved exercise technique, movement at target joints, use of target muscles after min to mod verbal, visual, tactile cues. Person educated: Patient and Spouse Education method:  Explanation, Demonstration, Verbal cues, and Handouts Education comprehension: verbalized understanding, returned demonstration, and verbal cues required   HOME EXERCISE PROGRAM:  Access Code: [email or web address] URL: [email or web address] Date: 02/02/2023 Prepared by: Ria Comment  Exercises - Supine Ankle Pumps  - 2 x daily - 7 x weekly - 2 sets - 10 reps - Supine Quad Set  - 2 x daily - 7 x weekly - 2 sets - 10 reps - 3s hold - Supine Gluteal Sets  - 2 x daily - 7 x weekly - 2 sets - 10 reps - 3s hold - Supine Hip Abduction  - 2 x daily - 7 x weekly - 2 sets - 10 reps - Supine Heel Slide (Mirrored)  - 2 x daily - 7 x weekly - 2 sets - 10 reps - Seated Long Arc Quad (Mirrored)  - 2 x daily - 7 x weekly - 2 sets - 10 reps - 3s hold   ASSESSMENT:  CLINICAL IMPRESSION: Continued strengthening with patient during session today. She denies any pain in L hip at rest or during exercise today but reports some continued L knee pain. Utilized Total Gym to progress to single leg squats. Plan to progress strengthening at future sessions. Pt encouraged to continue HEP and follow-up as scheduled. Encouraged regular icing of left hip if she continues to experience swelling. She will benefit from PT services to address deficits in strength, mobility, and pain in order to return to  full function at home.    OBJECTIVE IMPAIRMENTS: Abnormal gait, difficulty walking, decreased ROM, decreased strength, and pain.   ACTIVITY LIMITATIONS: lifting, bending, sitting, standing, squatting, sleeping, stairs, transfers, and bathing  PARTICIPATION LIMITATIONS: cleaning, shopping, community activity, and occupation  PERSONAL FACTORS: Past/current experiences, Profession, Time since onset of injury/illness/exacerbation, and 3+ comorbidities: depression, anxiety, migraines, chronic pain, and psoriatic arthritis  are also affecting patient's functional outcome.   REHAB POTENTIAL: Excellent  CLINICAL DECISION  MAKING: Unstable/unpredictable  EVALUATION COMPLEXITY: High   GOALS: Goals reviewed with patient? No  SHORT TERM GOALS: Target date: 03/02/2023   Pt will be independent with HEP in order to improve strength and range of motion as well as decrease hip pain to improve function at home and work. Baseline:  Goal status: INITIAL   LONG TERM GOALS: Target date: 03/30/2023   Pt will increase FOTO to at least 39 to demonstrate significant improvement in function at home and work related to L hip pain  Baseline: 02/02/23: 1 Goal status: INITIAL  2.  Pt will decrease worst hip pain by at least 2 points on the NPRS in order to demonstrate clinically significant reduction in hip pain. Baseline: 02/02/23: worst: 10/10 Goal status: INITIAL  3.  Pt will decrease TUG to below 14 seconds in order to demonstrate decreased fall risk.      Baseline: 02/02/23: 29.6s; Goal status: INITIAL  4.  Pt will decrease 5TSTS by at least 3 seconds in order to demonstrate clinically significant improvement in LE strength       Baseline: 02/02/23: 16.2s; Goal status: INITIAL  5.  Pt will increase pain-free strength of L hip in all directions to at least 4/5 in order to demonstrate improvement in strength and function  Baseline: 02/02/23: see above; Goal status: INITIAL  6. Pt will increase by at least 112.5' in order to demonstrate clinically significant improvement in cardiopulmonary endurance and community ambulation  Baseline: 02/05/23: 234' with RW; Goal status: INITIAL  7. Pt will increase self-selected 50m gait speed by at least 0.13 m/s in order to demonstrate clinically significant improvement in community ambulation.   Baseline: 02/05/23: self-selected: 23.6s = 0.42 m/s Goal status: INITIAL  PLAN: PT FREQUENCY: 1-2x/week  PT DURATION: 8 weeks  PLANNED INTERVENTIONS: Therapeutic exercises, Therapeutic activity, Neuromuscular re-education, Balance training, Gait training, Patient/Family education, Self  Care, Joint mobilization, Joint manipulation, Vestibular training, Canalith repositioning, Orthotic/Fit training, DME instructions, Dry Needling, Electrical stimulation, Spinal manipulation, Spinal mobilization, Cryotherapy, Moist heat, Taping, Traction, Ultrasound, Ionotophoresis 4mg /ml Dexamethasone, Manual therapy, and Re-evaluation.  PLAN FOR NEXT SESSION: progress strengthening, review/modify HEP as necessary;   Sharalyn Ink Lakhia Gengler PT, DPT, GCS  1:31 PM,03/06/23

## 2023-03-04 NOTE — Telephone Encounter (Signed)
Injection

## 2023-03-11 ENCOUNTER — Ambulatory Visit: Payer: Federal, State, Local not specified - PPO | Admitting: Family Medicine

## 2023-03-11 DIAGNOSIS — Z96642 Presence of left artificial hip joint: Secondary | ICD-10-CM | POA: Diagnosis not present

## 2023-03-12 ENCOUNTER — Ambulatory Visit: Payer: Federal, State, Local not specified - PPO

## 2023-03-12 ENCOUNTER — Other Ambulatory Visit (INDEPENDENT_AMBULATORY_CARE_PROVIDER_SITE_OTHER): Payer: Federal, State, Local not specified - PPO | Admitting: Radiology

## 2023-03-12 ENCOUNTER — Ambulatory Visit (INDEPENDENT_AMBULATORY_CARE_PROVIDER_SITE_OTHER): Payer: Federal, State, Local not specified - PPO | Admitting: Family Medicine

## 2023-03-12 ENCOUNTER — Encounter: Payer: Self-pay | Admitting: Family Medicine

## 2023-03-12 VITALS — BP 118/78 | HR 80 | Ht 63.0 in | Wt 158.0 lb

## 2023-03-12 DIAGNOSIS — M25552 Pain in left hip: Secondary | ICD-10-CM

## 2023-03-12 DIAGNOSIS — M25562 Pain in left knee: Secondary | ICD-10-CM

## 2023-03-12 DIAGNOSIS — G8929 Other chronic pain: Secondary | ICD-10-CM

## 2023-03-12 DIAGNOSIS — M6281 Muscle weakness (generalized): Secondary | ICD-10-CM | POA: Diagnosis not present

## 2023-03-12 NOTE — Therapy (Signed)
OUTPATIENT PHYSICAL THERAPY HIP TREATMENT  Patient Name: DEMIYA MAGNO MRN: [id number] DOB:1966-02-15, 57 y.o., female Today's Date: 03/12/2023   PT End of Session - 03/12/23 1108     Visit Number 10    Number of Visits 17    Date for PT Re-Evaluation 03/30/23    Authorization Type eval: 02/02/23    Authorization Time Period [employer] 2024 VL:75 combined visits per year    Authorization - Visit Number 23    Authorization - Number of Visits 75    PT Start Time 1105    PT Stop Time 1145    PT Time Calculation (min) 40 min    Activity Tolerance Patient tolerated treatment well    Behavior During Therapy [facility] for tasks assessed/performed            Past Medical History:  Diagnosis Date   Allergy    Anxiety    Chronic kidney disease    Chronic pain    Chronic sinusitis    COVID-19    09/14/20, 06/13/21   Depression    Heart murmur    Hiatal hernia    HPV (human papilloma virus) anogenital infection    Macromastia    Migraine    Psoriatic arthritis (HCC)    Shingles    UTI (urinary tract infection)    ecoli 09/2021   Past Surgical History:  Procedure Laterality Date   BREAST BIOPSY Right 01/28/2018   US guided biopsy - heart shaped   BREAST REDUCTION SURGERY Bilateral 04/03/2020   Procedure: MAMMARY REDUCTION  (BREAST);  Surgeon: Contogiannis, Chales Abrahams, MD;  Location: [facility];  Service: Plastics;  Laterality: Bilateral;  HAVE LIPOSUCTION MACHINE AVAILABLE   CHOLECYSTECTOMY  2009   COLONOSCOPY WITH PROPOFOL N/A 07/19/2018   Procedure: COLONOSCOPY WITH PROPOFOL;  Surgeon: Toney Reil, MD;  Location: [facility] ENDOSCOPY;  Service: Gastroenterology;  Laterality: N/A;   COLONOSCOPY WITH PROPOFOL N/A 09/17/2021   Procedure: COLONOSCOPY WITH PROPOFOL;  Surgeon: Toney Reil, MD;  Location: [facility] ENDOSCOPY;  Service: Gastroenterology;  Laterality: N/A;   COLONOSCOPY WITH PROPOFOL N/A 09/18/2021   Procedure: COLONOSCOPY WITH PROPOFOL;  Surgeon: Toney Reil, MD;  Location[facility];  Service: Gastroenterology;  Laterality: N/A;   ESOPHAGOGASTRODUODENOSCOPY (EGD) WITH PROPOFOL N/A 07/19/2018   Procedure: ESOPHAGOGASTRODUODENOSCOPY (EGD) WITH PROPOFOL;  Surgeon: Toney Reil, MD;  Location: [facility] ENDOSCOPY;  Service: Gastroenterology;  Laterality: N/A;   GASTRIC BYPASS     2015; duodenal switch    LAPAROSCOPIC GASTRIC BANDING  2008   removed 2009   REDUCTION MAMMAPLASTY     TUBAL LIGATION  1997   Patient Active Problem List   Diagnosis Date Noted   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) 10/21/2022   Lumbar radicular pain 10/01/2022   Fibromyalgia 10/01/2022   Normocytic anemia 06/13/2022   Elevated alkaline phosphatase level 05/28/2022   Chills 05/26/2022   Chronic left SI joint pain 11/13/2021   Piriformis syndrome, left 11/13/2021   Greater 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 11/13/2021   Primary osteoarthritis of right hip 09/16/2021   Plantar fasciitis 08/22/2021   Carpal tunnel syndrome on right 08/22/2021   Primary osteoarthritis of left hip 08/15/2021   Hyperphosphatemia 08/01/2021   Anemia in chronic kidney disease 04/18/2021   Chronic kidney disease, stage 3a (HCC) 04/18/2021   Hyperparathyroidism due to renal insufficiency (HCC) 04/18/2021   Hypertension 04/18/2021   Hiatal hernia 11/02/2020   History of migraine 11/02/2020   Gastroesophageal  reflux disease 11/02/2020   Brain fog 11/02/2020   Depression, recurrent (HCC) 03/18/2020   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 09/08/2018   Arthropathy of right shoulder 09/08/2018   Controlled substance agreement signed 09/08/2018   Myofascial pain syndrome 08/24/2018   Routine physical examination 08/24/2018   Iron deficiency anemia 08/24/2018   Elevated liver enzymes 07/19/2018   Vitamin D deficiency 07/19/2018   Colon cancer screening    Abdominal pain, epigastric 07/08/2018   Right shoulder pain 06/15/2018   Hemorrhoids  05/01/2017   Anxiety and depression 11/24/2016   Lumbar pain with radiation down left leg 09/11/2016   Large breasts 09/11/2016   Parotiditis 05/01/2016   Abnormal laboratory test result 03/26/2016   Proteinuria 03/26/2016   Right hip pain 04/18/2015   Sinusitis 01/08/2014   Psoriatic arthritis (HCC) 09/13/2013   DUB (dysfunctional uterine bleeding) 10/27/2012   Gastritis due to nonsteroidal anti-inflammatory drug 12/12/2011   Anaphylactic reaction due to shellfish 09/27/2010   THYROMEGALY 09/03/2010   ALLERGIC RHINITIS 03/15/2010   MIGRAINE [name] 05/17/2008   PCP: Rennie Plowman FNP  REFERRING PROVIDER: Cassell Smiles MD  REFERRING DIAG: [phone] (ICD-10-CM) - Unilateral primary osteoarthritis, left hip   Rationale for Evaluation and Treatment: Rehabilitation  THERAPY DIAG: Pain in left hip  Muscle weakness (generalized)  ONSET DATE: 09/02/2015 (approximate)  FOLLOW-UP APPT SCHEDULED WITH REFERRING PROVIDER: Yes   FROM INITIAL EVALUATION SUBJECTIVE:                                                                                                                                                                                         SUBJECTIVE STATEMENT: L anterior approach THA on 01/28/23  PERTINENT HISTORY:  Pt underwent L anterior approach THA on 01/28/23. She has struggled with pain control since surgery but otherwise no reported complications. She is using compressive icing 2x/day and performing ankle pumps.  History from 07/08/22 Pt reports a diagnosis of Lyme Disease in 2017 with persistent widespread chronic joint pain after treatment.  Of note she has severe L hip pain as well as pain in her L knee and right shoulder. Her L hip pain has been worsening recently. She denies any pain in her R hip or L shoulder. She was previously receiving L hip injections from Dr. Martha Clan however she reports that he refused to continue with further injections  so she was referred by pain management to Dr. Ashley Royalty. She saw Dr. Ashley Royalty and pt received a L hip intraarticular injection as well as an injection in her L SIJ and L greater trochanter. The injections have helped with her pain. She was also  referred to an orthopedic surgeon to discuss THR and referred for physical therapy. Pt expresses that she really enjoys exercising but is very limited by her pain.   PAIN:  Pain Intensity: Present: 8/10, Best: 7/10, Worst: 10/10 Pain location: Anterior L hip, L knee and into the calf; Pain Quality: constant  Radiating: Yes, down the posterior L thigh Numbness/Tingling: Yes, slight anterior/lateral hip numbness Focal Weakness: Yes, L hip weakness; Position of comfort: laying down (supine) Aggravating factors: extended standing, extended sitting, walking; Relieving factors: laying on back, body pillow, ice, pain medications; 24-hour pain behavior: worse at the end of the day History of prior back injury, pain, surgery, or therapy: Yes, history of chronic L hip pain; Dominant hand: right Imaging: Yes, IMPRESSION: 1. 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 2. Moderate pubic symphysis osteoarthritis. Red flags: Negative for bowel/bladder changes, saddle paresthesia, personal history of cancer, h/o spinal tumors, h/o compression fx, h/o abdominal aneurysm, abdominal pain, chills/fever, night sweats, nausea, vomiting, unrelenting pain,   PRECAUTIONS: Anterior hip  WEIGHT BEARING RESTRICTIONS: No  FALLS: Has patient fallen in last 6 months? No  Living Environment Lives with: lives with their spouse Lives in: House/apartment, one step to enter from the garage Stairs: Yes: Internal: 12 steps; on right going up Has following equipment at home: Single point cane, Walker - 2 wheeled, and Shower bench  Prior level of function: Independent  Occupational demands: works at [employer], stands for 8-10 hour  shifts  Hobbies: Exercising;  Patient Goals: Return to exercising.   OBJECTIVE:   Patient Surveys  LEFS to be completed FOTO 1, predicted improvement to 47  Cognition Patient is oriented to person, place, and time.  Recent memory is intact.  Remote memory is intact.  Attention span and concentration are intact.  Expressive speech is intact.  Patient's fund of knowledge is within normal limits for educational level.    Gross Musculoskeletal Assessment Tremor: None Bulk: Normal Tone: Normal Area around bandage is dry without signs of erythema, edema, or ecchymosis. Dried blood on bandage but not excessive. Superior margin of bandage is peeling off and pt plans to go to ortho for a clean dressing after therapy session.  GAIT: Antalgic gait on LLE with L hip externally rotated during gait. Significantly decreased self-selected gait speed. Full gait assessment deferred  Posture: Lumbar lordosis: WNL Iliac crest height: Equal bilaterally Lumbar lateral shift: Negative  AROM AROM (Normal range in degrees) AROM   Hip Right Left  Flexion (125) WNL At least 90 degrees. Pain limited AROM for all AROM  Extension (15)    Abduction (40)    Adduction     Internal Rotation (45)    External Rotation (45)        Knee    Flexion (135) WNL WNL  Extension (0) 0 0  (* = pain; Blank rows = not tested)  LE MMT: MMT (out of 5) Right  Left   Hip flexion 5 <3  Hip extension    Hip abduction (seated) 4+ 3+*  Hip adduction 4+ 4*  Hip internal rotation    Hip external rotation    Knee flexion (seated) 4+ 4+  Knee extension 5 5  Ankle dorsiflexion 5 5  Ankle plantarflexion    Ankle inversion    Ankle eversion    (* = pain; Blank rows = not tested)  Sensation Intact and symmetrical to light touch throughout BLE L2-S2. Hot/cold and proprioception deferred;  Muscle Length Hamstrings: R: Positive  for shortening around 70 degrees L: Positive for shortening around 70 degrees [place]  (quadriceps): R: Not examined L: Not examined Thomas (hip flexors): R: Not examined L: Not examined [place]: R: Not examined L: Not examined  Functional Tests TUG: 29.6s 5TSTS: 16.2s   TREATMENT   SUBJECTIVE: Pt reports that she is doing alright today. Denies L hip pain upon arrival. She is scheduled to see Dr. Ashley Royalty this afternoon for a L knee injection. She had an appointment with the orthopedic surgeon yesterday which went well. He felt like her leg length discrepancy would continue to improve as the muscles strengthened around her L hip. She is performing her HEP without issue. Pt arrives today without an assistive device.    PAIN: No L hip pain but persistent L knee pain;   Ther-ex  NuStep L0-6 x 10:00 minutes for warm-up, BLE strengthening, and while obtaining an interval history, therapist adjusting resistance; Total Gym (TG) Level 22 (L22) double leg squats x 10; TG L22 single leg squats 2 x 10 LLE, attempted on the RLE but discontinued due to an increase in knee pain; TG L22 single leg heel raises 2 x 10 BLE;  Standing exercises with 5# ankle weights (AW): Hip flexion marches x 10 BLE; Hip abduction x 10 BLE; Hamstring curls x 10 BLE; Hip extension x 10 BLE;  Seated LAQ with 5# AW x 10 BLE; Seated clams with manual resistance from therapist x 10 BLE;   Not performed: Side stepping with 2# AW x multiple laps; Forward 6" step-ups with BUE support leading with LLE x 10; L lateral 6" step-ups with BUE support x 10; Standing mini squats x 10; Hooklying LLE SLR x 10; Hooklying manually resisted clams x 10 BLE; Hooklying manually resisted adductor squeeze x 10 BLE; Supine LLE heel slides with resisted extension x 10; Supine LLE straight leg manually resisted hip abduction/adduction x 10 each;   PATIENT EDUCATION:  Education details: Pt educated throughout session about proper posture and technique with exercises. Improved exercise technique, movement at target joints,  use of target muscles after min to mod verbal, visual, tactile cues. Person educated: Patient and Spouse Education method: Explanation, Demonstration, Verbal cues, and Handouts Education comprehension: verbalized understanding, returned demonstration, and verbal cues required   HOME EXERCISE PROGRAM:  Access Code: [email or web address] URL: [email or web address] Date: 02/02/2023 Prepared by: Ria Comment  Exercises - Supine Ankle Pumps  - 2 x daily - 7 x weekly - 2 sets - 10 reps - Supine Quad Set  - 2 x daily - 7 x weekly - 2 sets - 10 reps - 3s hold - Supine Gluteal Sets  - 2 x daily - 7 x weekly - 2 sets - 10 reps - 3s hold - Supine Hip Abduction  - 2 x daily - 7 x weekly - 2 sets - 10 reps - Supine Heel Slide (Mirrored)  - 2 x daily - 7 x weekly - 2 sets - 10 reps - Seated Long Arc Quad (Mirrored)  - 2 x daily - 7 x weekly - 2 sets - 10 reps - 3s hold   ASSESSMENT:  CLINICAL IMPRESSION: Continued strengthening with patient during session today. She denies any pain in L hip at rest or during exercise today. Utilized Total Gym to progress to single leg squats but due to some R knee pain only able to perform on LLE. Progressed ankle weights to 5# during standing exercises. Plan to progress strengthening at future sessions. Pt encouraged to continue HEP  and follow-up as scheduled. She will benefit from PT services to address deficits in strength, mobility, and pain in order to return to full function at home.    OBJECTIVE IMPAIRMENTS: Abnormal gait, difficulty walking, decreased ROM, decreased strength, and pain.   ACTIVITY LIMITATIONS: lifting, bending, sitting, standing, squatting, sleeping, stairs, transfers, and bathing  PARTICIPATION LIMITATIONS: cleaning, shopping, community activity, and occupation  PERSONAL FACTORS: Past/current experiences, Profession, Time since onset of injury/illness/exacerbation, and 3+ comorbidities: depression, anxiety, migraines, chronic pain, and  psoriatic arthritis  are also affecting patient's functional outcome.   REHAB POTENTIAL: Excellent  CLINICAL DECISION MAKING: Unstable/unpredictable  EVALUATION COMPLEXITY: High   GOALS: Goals reviewed with patient? No  SHORT TERM GOALS: Target date: 03/02/2023   Pt will be independent with HEP in order to improve strength and range of motion as well as decrease hip pain to improve function at home and work. Baseline:  Goal status: INITIAL   LONG TERM GOALS: Target date: 03/30/2023   Pt will increase FOTO to at least 39 to demonstrate significant improvement in function at home and work related to L hip pain  Baseline: 02/02/23: 1 Goal status: INITIAL  2.  Pt will decrease worst hip pain by at least 2 points on the NPRS in order to demonstrate clinically significant reduction in hip pain. Baseline: 02/02/23: worst: 10/10 Goal status: INITIAL  3.  Pt will decrease TUG to below 14 seconds in order to demonstrate decreased fall risk.      Baseline: 02/02/23: 29.6s; Goal status: INITIAL  4.  Pt will decrease 5TSTS by at least 3 seconds in order to demonstrate clinically significant improvement in LE strength       Baseline: 02/02/23: 16.2s; Goal status: INITIAL  5.  Pt will increase pain-free strength of L hip in all directions to at least 4/5 in order to demonstrate improvement in strength and function  Baseline: 02/02/23: see above; Goal status: INITIAL  6. Pt will increase by at least 112.5' in order to demonstrate clinically significant improvement in cardiopulmonary endurance and community ambulation  Baseline: 02/05/23: 234' with RW; Goal status: INITIAL  7. Pt will increase self-selected 66m gait speed by at least 0.13 m/s in order to demonstrate clinically significant improvement in community ambulation.   Baseline: 02/05/23: self-selected: 23.6s = 0.42 m/s Goal status: INITIAL  PLAN: PT FREQUENCY: 1-2x/week  PT DURATION: 8 weeks  PLANNED INTERVENTIONS: Therapeutic  exercises, Therapeutic activity, Neuromuscular re-education, Balance training, Gait training, Patient/Family education, Self Care, Joint mobilization, Joint manipulation, Vestibular training, Canalith repositioning, Orthotic/Fit training, DME instructions, Dry Needling, Electrical stimulation, Spinal manipulation, Spinal mobilization, Cryotherapy, Moist heat, Taping, Traction, Ultrasound, Ionotophoresis 4mg /ml Dexamethasone, Manual therapy, and Re-evaluation.  PLAN FOR NEXT SESSION: progress strengthening, review/modify HEP as necessary;   Sharalyn Ink Jared Cahn PT, DPT, GCS  1:10 PM,03/12/23

## 2023-03-13 DIAGNOSIS — G8929 Other chronic pain: Secondary | ICD-10-CM | POA: Insufficient documentation

## 2023-03-13 NOTE — Progress Notes (Signed)
     Primary Care / Sports Medicine Office Visit  Patient Information:  Patient ID: [name], female DOB: 08-11-1966 Age: 57 y.o. MRN: [id number]   Barbara Faulkner is a pleasant 57 y.o. female presenting with the following:  Chief Complaint  Patient presents with   Left knee pain    Pain, last shot lasted 3 months    Vitals:   03/12/23 1319  BP: 118/78  Pulse: 80  SpO2: 98%   Vitals:   03/12/23 1319  Weight: 158 lb (71.7 kg)  Height: 5\' 3"  (1.6 m)   Body mass index is 27.99 kg/m.  No results found.   Independent interpretation of notes and tests performed by another provider:   None  Procedures performed:   Procedure:  Injection of left knee under ultrasound guidance. Ultrasound guidance utilized for out of plane anterolateral approach, no effusion Samsung HS60 device utilized with permanent recording / reporting. Verbal informed consent obtained and verified. Skin prepped in a sterile fashion. Ethyl chloride for topical local analgesia.  Completed without difficulty and tolerated well. Medication: triamcinolone acetonide 40 mg/mL suspension for injection 1 mL total and 2 mL lidocaine 1% without epinephrine utilized for needle placement anesthetic Advised to contact for fevers/chills, erythema, induration, drainage, or persistent bleeding.   Pertinent History, Exam, Impression, and Recommendations:   Barbara Faulkner was seen today for left knee pain.  Chronic pain of left knee Assessment & Plan: Presents for acute on chronic flare of left knee pain, of note recent ipsilateral total hip arthroplasty.  Completed physical therapy and continues home exercises, has been told about subtle leg length discrepancy.  Examination with diffuse arthralgia, no ligamentous laxity.  -Patient elected proceed with ultrasound-guided intra-articular corticosteroid injection - Home-based rehab advised - Follow-up as needed  Orders: -     [place] LIMITED JOINT SPACE STRUCTURES LOW LEFT;  Future     Orders & Medications No orders of the defined types were placed in this encounter.  Orders Placed This Encounter  Procedures   [place] LIMITED JOINT SPACE STRUCTURES LOW LEFT     No follow-ups on file.     [name], MD, [facility]   Primary Care Sports Medicine Primary Care and Sports Medicine at [facility]

## 2023-03-13 NOTE — Assessment & Plan Note (Signed)
Presents for acute on chronic flare of left knee pain, of note recent ipsilateral total hip arthroplasty.  Completed physical therapy and continues home exercises, has been told about subtle leg length discrepancy.  Examination with diffuse arthralgia, no ligamentous laxity.  -Patient elected proceed with ultrasound-guided intra-articular corticosteroid injection - Home-based rehab advised - Follow-up as needed

## 2023-03-20 ENCOUNTER — Ambulatory Visit: Payer: Federal, State, Local not specified - PPO

## 2023-03-20 DIAGNOSIS — M25552 Pain in left hip: Secondary | ICD-10-CM | POA: Diagnosis not present

## 2023-03-20 DIAGNOSIS — M6281 Muscle weakness (generalized): Secondary | ICD-10-CM

## 2023-03-22 NOTE — Therapy (Signed)
OUTPATIENT PHYSICAL THERAPY HIP TREATMENT  Patient Name: Barbara Faulkner MRN: [id number] DOB:1966-08-02, 57 y.o., female Today's Date: 03/22/2023   PT End of Session - 03/22/23 2015     Visit Number 11    Number of Visits 17    Date for PT Re-Evaluation 03/30/23    Authorization Type eval: 02/02/23    Authorization Time Period [employer] 2024 VL:75 combined visits per year    Authorization - Visit Number 24    Authorization - Number of Visits 75    PT Start Time 1105    PT Stop Time 1150    PT Time Calculation (min) 45 min    Activity Tolerance Patient tolerated treatment well    Behavior During Therapy [facility] for tasks assessed/performed            Past Medical History:  Diagnosis Date   Allergy    Anxiety    Chronic kidney disease    Chronic pain    Chronic sinusitis    COVID-19    09/14/20, 06/13/21   Depression    Heart murmur    Hiatal hernia    HPV (human papilloma virus) anogenital infection    Macromastia    Migraine    Psoriatic arthritis (HCC)    Shingles    UTI (urinary tract infection)    ecoli 09/2021   Past Surgical History:  Procedure Laterality Date   BREAST BIOPSY Right 01/28/2018   US guided biopsy - heart shaped   BREAST REDUCTION SURGERY Bilateral 04/03/2020   Procedure: MAMMARY REDUCTION  (BREAST);  Surgeon: Contogiannis, Chales Abrahams, MD;  Location: [facility];  Service: Plastics;  Laterality: Bilateral;  HAVE LIPOSUCTION MACHINE AVAILABLE   CHOLECYSTECTOMY  2009   COLONOSCOPY WITH PROPOFOL N/A 07/19/2018   Procedure: COLONOSCOPY WITH PROPOFOL;  Surgeon: Toney Reil, MD;  Location: [facility] ENDOSCOPY;  Service: Gastroenterology;  Laterality: N/A;   COLONOSCOPY WITH PROPOFOL N/A 09/17/2021   Procedure: COLONOSCOPY WITH PROPOFOL;  Surgeon: Toney Reil, MD;  Location: [facility] ENDOSCOPY;  Service: Gastroenterology;  Laterality: N/A;   COLONOSCOPY WITH PROPOFOL N/A 09/18/2021   Procedure: COLONOSCOPY WITH PROPOFOL;  Surgeon: Toney Reil, MD;  Location[facility] ENDOSCOPY;  Service: Gastroenterology;  Laterality: N/A;   ESOPHAGOGASTRODUODENOSCOPY (EGD) WITH PROPOFOL N/A 07/19/2018   Procedure: ESOPHAGOGASTRODUODENOSCOPY (EGD) WITH PROPOFOL;  Surgeon: Toney Reil, MD;  Location: [facility] ENDOSCOPY;  Service: Gastroenterology;  Laterality: N/A;   GASTRIC BYPASS     2015; duodenal switch    LAPAROSCOPIC GASTRIC BANDING  2008   removed 2009   REDUCTION MAMMAPLASTY     TUBAL LIGATION  1997   Patient Active Problem List   Diagnosis Date Noted   Chronic pain of left knee 03/13/2023   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) 10/21/2022   Lumbar radicular pain 10/01/2022   Fibromyalgia 10/01/2022   Normocytic anemia 06/13/2022   Elevated alkaline phosphatase level 05/28/2022   Chills 05/26/2022   Chronic left SI joint pain 11/13/2021   Piriformis syndrome, left 11/13/2021   Greater 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 11/13/2021   Primary osteoarthritis of right hip 09/16/2021   Plantar fasciitis 08/22/2021   Carpal tunnel syndrome on right 08/22/2021   Primary osteoarthritis of left hip 08/15/2021   Hyperphosphatemia 08/01/2021   Anemia in chronic kidney disease 04/18/2021   Chronic kidney disease, stage 3a (HCC) 04/18/2021   Hyperparathyroidism due to renal insufficiency (HCC) 04/18/2021   Hypertension 04/18/2021   Hiatal hernia 11/02/2020  History of migraine 11/02/2020   Gastroesophageal reflux disease 11/02/2020   Brain fog 11/02/2020   Depression, recurrent (HCC) 03/18/2020   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 09/08/2018   Arthropathy of right shoulder 09/08/2018   Controlled substance agreement signed 09/08/2018   Myofascial pain syndrome 08/24/2018   Routine physical examination 08/24/2018   Iron deficiency anemia 08/24/2018   Elevated liver enzymes 07/19/2018   Vitamin D deficiency 07/19/2018   Colon cancer screening    Abdominal pain, epigastric 07/08/2018   Right  shoulder pain 06/15/2018   Hemorrhoids 05/01/2017   Anxiety and depression 11/24/2016   Lumbar pain with radiation down left leg 09/11/2016   Large breasts 09/11/2016   Parotiditis 05/01/2016   Abnormal laboratory test result 03/26/2016   Proteinuria 03/26/2016   Right hip pain 04/18/2015   Sinusitis 01/08/2014   Psoriatic arthritis (HCC) 09/13/2013   DUB (dysfunctional uterine bleeding) 10/27/2012   Gastritis due to nonsteroidal anti-inflammatory drug 12/12/2011   Anaphylactic reaction due to shellfish 09/27/2010   THYROMEGALY 09/03/2010   ALLERGIC RHINITIS 03/15/2010   MIGRAINE [name] 05/17/2008   PCP: Rennie Plowman FNP  REFERRING PROVIDER: Cassell Smiles MD  REFERRING DIAG: M16.12 (ICD-10-CM) - Unilateral primary osteoarthritis, left hip   Rationale for Evaluation and Treatment: Rehabilitation  THERAPY DIAG: Pain in left hip  Muscle weakness (generalized)  ONSET DATE: 09/02/2015 (approximate)  FOLLOW-UP APPT SCHEDULED WITH REFERRING PROVIDER: Yes   FROM INITIAL EVALUATION SUBJECTIVE:                                                                                                                                                                                         SUBJECTIVE STATEMENT: L anterior approach THA on 01/28/23  PERTINENT HISTORY:  Pt underwent L anterior approach THA on 01/28/23. She has struggled with pain control since surgery but otherwise no reported complications. She is using compressive icing 2x/day and performing ankle pumps.  History from 07/08/22 Pt reports a diagnosis of Lyme Disease in 2017 with persistent widespread chronic joint pain after treatment.  Of note she has severe L hip pain as well as pain in her L knee and right shoulder. Her L hip pain has been worsening recently. She denies any pain in her R hip or L shoulder. She was previously receiving L hip injections from Dr. Martha Clan however she reports that he  refused to continue with further injections so she was referred by pain management to Dr. Ashley Royalty. She saw Dr. Ashley Royalty and pt received a L hip intraarticular injection as well as an injection in her L SIJ and L greater trochanter. The injections have  helped with her pain. She was also referred to an orthopedic surgeon to discuss THR and referred for physical therapy. Pt expresses that she really enjoys exercising but is very limited by her pain.   PAIN:  Pain Intensity: Present: 8/10, Best: 7/10, Worst: 10/10 Pain location: Anterior L hip, L knee and into the calf; Pain Quality: constant  Radiating: Yes, down the posterior L thigh Numbness/Tingling: Yes, slight anterior/lateral hip numbness Focal Weakness: Yes, L hip weakness; Position of comfort: laying down (supine) Aggravating factors: extended standing, extended sitting, walking; Relieving factors: laying on back, body pillow, ice, pain medications; 24-hour pain behavior: worse at the end of the day History of prior back injury, pain, surgery, or therapy: Yes, history of chronic L hip pain; Dominant hand: right Imaging: Yes, IMPRESSION: 1. 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 2. Moderate pubic symphysis osteoarthritis. Red flags: Negative for bowel/bladder changes, saddle paresthesia, personal history of cancer, h/o spinal tumors, h/o compression fx, h/o abdominal aneurysm, abdominal pain, chills/fever, night sweats, nausea, vomiting, unrelenting pain,   PRECAUTIONS: Anterior hip  WEIGHT BEARING RESTRICTIONS: No  FALLS: Has patient fallen in last 6 months? No  Living Environment Lives with: lives with their spouse Lives in: House/apartment, one step to enter from the garage Stairs: Yes: Internal: 12 steps; on right going up Has following equipment at home: Single point cane, Walker - 2 wheeled, and Shower bench  Prior level of function: Independent  Occupational demands: works at [employer], stands for 8-10 hour shifts  Hobbies: Exercising;  Patient Goals: Return to exercising.   OBJECTIVE:   Patient Surveys  LEFS to be completed FOTO 1, predicted improvement to 69  Cognition Patient is oriented to person, place, and time.  Recent memory is intact.  Remote memory is intact.  Attention span and concentration are intact.  Expressive speech is intact.  Patient's fund of knowledge is within normal limits for educational level.    Gross Musculoskeletal Assessment Tremor: None Bulk: Normal Tone: Normal Area around bandage is dry without signs of erythema, edema, or ecchymosis. Dried blood on bandage but not excessive. Superior margin of bandage is peeling off and pt plans to go to ortho for a clean dressing after therapy session.  GAIT: Antalgic gait on LLE with L hip externally rotated during gait. Significantly decreased self-selected gait speed. Full gait assessment deferred  Posture: Lumbar lordosis: WNL Iliac crest height: Equal bilaterally Lumbar lateral shift: Negative  AROM AROM (Normal range in degrees) AROM   Hip Right Left  Flexion (125) WNL At least 90 degrees. Pain limited AROM for all AROM  Extension (15)    Abduction (40)    Adduction     Internal Rotation (45)    External Rotation (45)        Knee    Flexion (135) WNL WNL  Extension (0) 0 0  (* = pain; Blank rows = not tested)  LE MMT: MMT (out of 5) Right  Left   Hip flexion 5 <3  Hip extension    Hip abduction (seated) 4+ 3+*  Hip adduction 4+ 4*  Hip internal rotation    Hip external rotation    Knee flexion (seated) 4+ 4+  Knee extension 5 5  Ankle dorsiflexion 5 5  Ankle plantarflexion    Ankle inversion    Ankle eversion    (* = pain; Blank rows = not tested)  Sensation Intact and symmetrical to light touch throughout BLE L2-S2. Hot/cold and proprioception  deferred;  Muscle Length Hamstrings: R: Positive for shortening around 70 degrees L: Positive for  shortening around 70 degrees [place] (quadriceps): R: Not examined L: Not examined Thomas (hip flexors): R: Not examined L: Not examined [place]: R: Not examined L: Not examined  Functional Tests TUG: 29.6s 5TSTS: 16.2s   TREATMENT   SUBJECTIVE: Pt reports that she is doing alright today. Denies L hip pain upon arrival. She had her L knee injection which has helped considerably. She also feels like her length length discrepancy is starting to improve. She is performing her HEP without issue. Pt arrives today without an assistive device.    PAIN: No L hip pain but persistent L knee pain;   Ther-ex  NuStep L0-6 x 10:00 minutes for warm-up, BLE strengthening, and while obtaining an interval history, therapist adjusting resistance; Total Gym (TG) Level 22 (L22) double leg squats x 10; TG L22 single leg squats 2 x 10 BLE;  Standing exercises with 5# ankle weights (AW): Hip flexion marches x 10 BLE; Hip abduction x 10 BLE; Hamstring curls x 10 BLE; Hip extension x 10 BLE;  Seated LAQ with 5# AW x 10 BLE; Seated clams with manual resistance from therapist x 10 BLE;   Not performed: Side stepping with 2# AW x multiple laps; Forward 6" step-ups with BUE support leading with LLE x 10; L lateral 6" step-ups with BUE support x 10; Standing mini squats x 10; Hooklying LLE SLR x 10; Hooklying manually resisted clams x 10 BLE; Hooklying manually resisted adductor squeeze x 10 BLE; Supine LLE heel slides with resisted extension x 10; Supine LLE straight leg manually resisted hip abduction/adduction x 10 each;   PATIENT EDUCATION:  Education details: Pt educated throughout session about proper posture and technique with exercises. Improved exercise technique, movement at target joints, use of target muscles after min to mod verbal, visual, tactile cues. Person educated: Patient and Spouse Education method: Explanation, Demonstration, Verbal cues, and Handouts Education comprehension:  verbalized understanding, returned demonstration, and verbal cues required   HOME EXERCISE PROGRAM:  Access Code: [email or web address] URL: [email or web address] Date: 02/02/2023 Prepared by: Ria Comment  Exercises - Supine Ankle Pumps  - 2 x daily - 7 x weekly - 2 sets - 10 reps - Supine Quad Set  - 2 x daily - 7 x weekly - 2 sets - 10 reps - 3s hold - Supine Gluteal Sets  - 2 x daily - 7 x weekly - 2 sets - 10 reps - 3s hold - Supine Hip Abduction  - 2 x daily - 7 x weekly - 2 sets - 10 reps - Supine Heel Slide (Mirrored)  - 2 x daily - 7 x weekly - 2 sets - 10 reps - Seated Long Arc Quad (Mirrored)  - 2 x daily - 7 x weekly - 2 sets - 10 reps - 3s hold   ASSESSMENT:  CLINICAL IMPRESSION: Continued strengthening with patient during session today. She denies any pain in L hip at rest or during exercise today. Utilized Total Gym to progress single leg squats. Plan to progress strengthening at future sessions. Pt encouraged to continue HEP and follow-up as scheduled. She will benefit from PT services to address deficits in strength, mobility, and pain in order to return to full function at home.    OBJECTIVE IMPAIRMENTS: Abnormal gait, difficulty walking, decreased ROM, decreased strength, and pain.   ACTIVITY LIMITATIONS: lifting, bending, sitting, standing, squatting, sleeping, stairs, transfers, and bathing  PARTICIPATION LIMITATIONS: cleaning, shopping,  community activity, and occupation  PERSONAL FACTORS: Past/current experiences, Profession, Time since onset of injury/illness/exacerbation, and 3+ comorbidities: depression, anxiety, migraines, chronic pain, and psoriatic arthritis  are also affecting patient's functional outcome.   REHAB POTENTIAL: Excellent  CLINICAL DECISION MAKING: Unstable/unpredictable  EVALUATION COMPLEXITY: High   GOALS: Goals reviewed with patient? No  SHORT TERM GOALS: Target date: 03/02/2023   Pt will be independent with HEP in order to  improve strength and range of motion as well as decrease hip pain to improve function at home and work. Baseline:  Goal status: INITIAL   LONG TERM GOALS: Target date: 03/30/2023   Pt will increase FOTO to at least 39 to demonstrate significant improvement in function at home and work related to L hip pain  Baseline: 02/02/23: 1 Goal status: INITIAL  2.  Pt will decrease worst hip pain by at least 2 points on the NPRS in order to demonstrate clinically significant reduction in hip pain. Baseline: 02/02/23: worst: 10/10 Goal status: INITIAL  3.  Pt will decrease TUG to below 14 seconds in order to demonstrate decreased fall risk.      Baseline: 02/02/23: 29.6s; Goal status: INITIAL  4.  Pt will decrease 5TSTS by at least 3 seconds in order to demonstrate clinically significant improvement in LE strength       Baseline: 02/02/23: 16.2s; Goal status: INITIAL  5.  Pt will increase pain-free strength of L hip in all directions to at least 4/5 in order to demonstrate improvement in strength and function  Baseline: 02/02/23: see above; Goal status: INITIAL  6. Pt will increase by at least 112.5' in order to demonstrate clinically significant improvement in cardiopulmonary endurance and community ambulation  Baseline: 02/05/23: 234' with RW; Goal status: INITIAL  7. Pt will increase self-selected 13m gait speed by at least 0.13 m/s in order to demonstrate clinically significant improvement in community ambulation.   Baseline: 02/05/23: self-selected: 23.6s = 0.42 m/s Goal status: INITIAL  PLAN: PT FREQUENCY: 1-2x/week  PT DURATION: 8 weeks  PLANNED INTERVENTIONS: Therapeutic exercises, Therapeutic activity, Neuromuscular re-education, Balance training, Gait training, Patient/Family education, Self Care, Joint mobilization, Joint manipulation, Vestibular training, Canalith repositioning, Orthotic/Fit training, DME instructions, Dry Needling, Electrical stimulation, Spinal manipulation, Spinal  mobilization, Cryotherapy, Moist heat, Taping, Traction, Ultrasound, Ionotophoresis 4mg /ml Dexamethasone, Manual therapy, and Re-evaluation.  PLAN FOR NEXT SESSION: progress strengthening, review/modify HEP as necessary;   Sharalyn Ink Carlen Rebuck PT, DPT, GCS  8:16 PM,03/22/23

## 2023-03-24 ENCOUNTER — Ambulatory Visit: Payer: Federal, State, Local not specified - PPO

## 2023-03-24 DIAGNOSIS — M6281 Muscle weakness (generalized): Secondary | ICD-10-CM

## 2023-03-24 DIAGNOSIS — M25552 Pain in left hip: Secondary | ICD-10-CM

## 2023-03-26 ENCOUNTER — Ambulatory Visit: Payer: Federal, State, Local not specified - PPO

## 2023-03-30 ENCOUNTER — Ambulatory Visit: Payer: Federal, State, Local not specified - PPO

## 2023-03-30 DIAGNOSIS — M6281 Muscle weakness (generalized): Secondary | ICD-10-CM

## 2023-03-30 DIAGNOSIS — M25552 Pain in left hip: Secondary | ICD-10-CM

## 2023-04-01 ENCOUNTER — Ambulatory Visit: Payer: Federal, State, Local not specified - PPO

## 2023-04-07 ENCOUNTER — Ambulatory Visit: Payer: Federal, State, Local not specified - PPO

## 2023-04-21 ENCOUNTER — Ambulatory Visit
Payer: Federal, State, Local not specified - PPO | Attending: Student in an Organized Health Care Education/Training Program | Admitting: Student in an Organized Health Care Education/Training Program

## 2023-04-21 ENCOUNTER — Encounter: Payer: Self-pay | Admitting: Student in an Organized Health Care Education/Training Program

## 2023-04-21 DIAGNOSIS — G894 Chronic pain syndrome: Secondary | ICD-10-CM

## 2023-04-21 DIAGNOSIS — M7918 Myalgia, other site: Secondary | ICD-10-CM | POA: Diagnosis not present

## 2023-04-21 DIAGNOSIS — L4050 Arthropathic psoriasis, unspecified: Secondary | ICD-10-CM | POA: Diagnosis not present

## 2023-04-21 DIAGNOSIS — M1612 Unilateral primary osteoarthritis, left hip: Secondary | ICD-10-CM | POA: Diagnosis not present

## 2023-04-21 DIAGNOSIS — G8929 Other chronic pain: Secondary | ICD-10-CM | POA: Diagnosis not present

## 2023-04-21 DIAGNOSIS — M48061 Spinal stenosis, lumbar region without neurogenic claudication: Secondary | ICD-10-CM | POA: Diagnosis not present

## 2023-04-21 MED ORDER — GABAPENTIN 600 MG PO TABS: 600.0000 mg | ORAL_TABLET | Freq: Every day | ORAL | 1 refills | Status: DC

## 2023-04-21 MED ORDER — TRAMADOL HCL ER 200 MG PO TB24
200.0000 mg | ORAL_TABLET | Freq: Every day | ORAL | 2 refills | Status: DC
Start: 2023-04-29 — End: 2023-07-14

## 2023-04-21 MED ORDER — GABAPENTIN 400 MG PO CAPS
400.0000 mg | ORAL_CAPSULE | Freq: Every day | ORAL | 2 refills | Status: DC
Start: 2023-04-21 — End: 2023-04-21

## 2023-04-21 MED ORDER — OXYCODONE-ACETAMINOPHEN 5-325 MG PO TABS
1.0000 | ORAL_TABLET | Freq: Three times a day (TID) | ORAL | 0 refills | Status: AC | PRN
Start: 2023-05-06 — End: 2023-06-05

## 2023-04-21 MED ORDER — CYCLOBENZAPRINE HCL 5 MG PO TABS
5.0000 mg | ORAL_TABLET | Freq: Every evening | ORAL | 1 refills | Status: DC | PRN
Start: 2023-04-21 — End: 2024-03-09

## 2023-04-21 NOTE — Progress Notes (Signed)
Nursing Pain Medication Assessment:  Safety precautions to be maintained throughout the outpatient stay will include: orient to surroundings, keep bed in low position, maintain call bell within reach at all times, provide assistance with transfer out of bed and ambulation.  Medication Inspection Compliance: Pill count conducted under aseptic conditions, in front of the patient. Neither the pills nor the bottle was removed from the patient's sight at any time. Once count was completed pills were immediately returned to the patient in their original bottle.  Medication #1: Oxycodone/APAP Pill/Patch Count:  27 of 90 pills remain Pill/Patch Appearance: Markings consistent with prescribed medication Bottle Appearance: Standard pharmacy container. Clearly labeled. Filled Date: 06 / 04 / 2024 Last Medication intake:  Today  Medication #2: Tramadol (Ultram) Pill/Patch Count:  10 of 30 pills remain Pill/Patch Appearance: Markings consistent with prescribed medication Bottle Appearance: Standard pharmacy container. Clearly labeled. Filled Date: 07 / 29 / 2024 Last Medication intake:  Today

## 2023-04-21 NOTE — Progress Notes (Signed)
PROVIDER NOTE: Information contained herein reflects review and annotations entered in association with encounter. Interpretation of such information and data should be left to medically-trained personnel. Information provided to patient can be located elsewhere in the medical record under "Patient Instructions". Document created using STT-dictation technology, any transcriptional errors that may result from process are unintentional.    Patient: Barbara Faulkner  Service Category: E/M  Provider: Edward Jolly, MD  DOB: 1965-11-21  DOS: 02/26/2021  Specialty: Interventional Pain Management  MRN: [id number]  Setting: Ambulatory outpatient  PCP: McLean-Scocuzza, Pasty Spillers, MD  Type: Established Patient    Referring Provider: Quentin Ore *  Location: Office  Delivery: Face-to-face     HPI  [name], a 57 y.o. year old female, is here today because of her No primary diagnosis found.. [name]'s primary complain today is Knee Pain (Left ) and Foot Pain (Right, plantar fasciatis )  Last encounter: My last encounter with her was on 01/01/23  Pertinent problems: 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 Pain Assessment: Severity of Chronic pain is reported as a 7 /10. Location: Knee (right foot) Left/denies. Onset: More than a month ago. Quality: Discomfort, Constant, Sharp. Timing: Constant. Modifying factor(s): medications and stretching. Vitals:  height is 5\' 3"  (1.6 m) and weight is 147 lb (66.7 kg). Her temporal temperature is 97.6 F (36.4 C). Her blood pressure is 130/85 and her pulse is 84. Her respiration is 16 and oxygen saturation is 98%.   Reason for encounter: both, medication management and post-procedure assessment.     Patient follows up today for medication management.  She is status post  left hip replacement with [facility].  She states that the first 2 weeks after surgery were difficult from a pain standpoint.  She states that she is healing now and that has improved.  She is working with Barbara Cower at physical therapy to help with postsurgical rehab of her left hip.  She is overall pleased with the results. She states that she is dealing with more restless leg pain symptoms.  We discussed increasing her gabapentin to 600 mg nightly to see if that has an impact.   Pharmacotherapy Assessment   Analgesic: Tramadol 200 mg ER, continue oxycodone 5 mg daily for breakthrough pain.     Monitoring: [place] PMP: PDMP reviewed during this encounter.       Pharmacotherapy: No side-effects or adverse reactions reported. Compliance: No problems identified. Effectiveness: Clinically acceptable.  UDS:  Summary  Date Value Ref Range Status  10/01/2022 Note  Final    Comment:    ==================================================================== [employer] Select 13 (MW) ==================================================================== Test                             Result       Flag       Units  Drug Present and Declared for Prescription Verification   Oxycodone                      293          EXPECTED   ng/mg creat   Oxymorphone                    161  EXPECTED   ng/mg creat   Noroxycodone                   707          EXPECTED   ng/mg creat   Noroxymorphone                 144          EXPECTED   ng/mg creat    Sources of oxycodone are scheduled prescription medications.    Oxymorphone, noroxycodone, and noroxymorphone are expected    metabolites of oxycodone. Oxymorphone is also available as a    scheduled prescription medication.    Tramadol                       >3937        EXPECTED   ng/mg creat   O-Desmethyltramadol            >3937        EXPECTED   ng/mg creat   N-Desmethyltramadol            >3937        EXPECTED   ng/mg creat    Source of tramadol is a  prescription medication. O-desmethyltramadol    and N-desmethyltramadol are expected metabolites of tramadol.  ==================================================================== Test                      Result    Flag   Units      Ref Range   Creatinine              127              mg/dL      >=03 ==================================================================== Declared Medications:  The flagging and interpretation on this report are based on the  following declared medications.  Unexpected results may arise from  inaccuracies in the declared medications.   **Note: The testing scope of this panel includes these medications:   Oxycodone (Percocet)  Tramadol (Ultram)   **Note: The testing scope of this panel does not include the  following reported medications:   Acetaminophen (Percocet)  Calcitriol  Cholecalciferol  Cyclobenzaprine (Flexeril)  Duloxetine (Cymbalta)  Gabapentin (Neurontin)  Lamotrigine (Lamictal)  Pantoprazole (Protonix)  Quetiapine (Seroquel)  Vitamin D ==================================================================== For clinical consultation, please call [phone]. ====================================================================       ROS  Constitutional: Denies any fever or chills Gastrointestinal: No reported hemesis, hematochezia, vomiting, or acute GI distress Musculoskeletal:  Left hip pain, improving Neurological: No reported episodes of acute onset apraxia, aphasia, dysarthria, agnosia, amnesia, paralysis, loss of coordination, or loss of consciousness  Medication Review  QUEtiapine, calcitRIOL, cyclobenzaprine, gabapentin, lamoTRIgine, oxyCODONE-acetaminophen, and traMADol  History Review  Allergy: Ms. Huard is allergic to shellfish allergy and nsaids. Drug: Ms. Rober  reports no history of drug use. Alcohol:  reports that she does not currently use alcohol. Tobacco:  reports that she quit smoking about 19 years ago.  Her smoking use included cigarettes. She started smoking about 39 years ago. She has a 40 pack-year smoking history. She has never used smokeless tobacco. Social: Ms. Delrio  reports that she quit smoking about 19 years ago. Her smoking use included cigarettes. She started smoking about 39 years ago. She has a 40 pack-year smoking history. She has never used smokeless tobacco. She reports that she does not currently use alcohol. She reports that she does not use drugs. Medical:  has a  past medical history of Allergy, Anxiety, Chronic kidney disease, Chronic pain, Chronic sinusitis, COVID-19, Depression, Heart murmur, Hiatal hernia, HPV (human papilloma virus) anogenital infection, Macromastia, Migraine, Psoriatic arthritis (HCC), Shingles, and UTI (urinary tract infection). Surgical: Ms. Pauls  has a past surgical history that includes Cholecystectomy (2009); Tubal ligation (1997); Laparoscopic gastric banding (2008); Gastric bypass; Breast biopsy (Right, 01/28/2018); Colonoscopy with propofol (N/A, 07/19/2018); Esophagogastroduodenoscopy (egd) with propofol (N/A, 07/19/2018); Breast reduction surgery (Bilateral, 04/03/2020); Colonoscopy with propofol (N/A, 09/17/2021); Colonoscopy with propofol (N/A, 09/18/2021); and Reduction mammaplasty. Family: family history includes HIV in her father; Hepatitis in her maternal uncle; Hyperlipidemia in her mother; Hypertension in her mother; Lung cancer in her maternal grandfather; Stroke in her mother.  Laboratory Chemistry Profile   Renal Lab Results  Component Value Date   BUN 18 06/11/2022   CREATININE 1.10 06/11/2022   BCR NOT APPLICABLE 07/14/2018   GFR 57.17 (L) 06/11/2022   GFRAA >60 02/15/2014   GFRNONAA 51 (L) 05/26/2022     Hepatic Lab Results  Component Value Date   AST 85 (H) 06/11/2022   ALT 90 (H) 06/11/2022   ALBUMIN 4.0 06/11/2022   ALKPHOS 150 (H) 06/11/2022   ALKPHOS 171 (H) 06/11/2022   AMYLASE 33 09/25/2014   LIPASE 124  10/04/2013     Electrolytes Lab Results  Component Value Date   NA 142 06/11/2022   K 4.3 06/11/2022   CL 110 06/11/2022   CALCIUM 8.7 06/11/2022   MG 1.9 07/14/2018   PHOS 5.0 (H) 09/25/2014     Bone Lab Results  Component Value Date   VD25OH 40.34 06/11/2022     Inflammation (CRP: Acute Phase) (ESR: Chronic Phase) Lab Results  Component Value Date   CRP <1.0 03/10/2019   ESRSEDRATE 3 03/10/2019       Note: Above Lab results reviewed.  Recent Imaging Review  [place] LIMITED JOINT SPACE STRUCTURES LOW LEFT Procedure:  Injection of left knee under ultrasound guidance. Ultrasound guidance utilized for out of plane anterolateral approach, no  effusion Samsung HS60 device utilized with permanent recording / reporting. Verbal informed consent obtained and verified. Skin prepped in a sterile fashion. Ethyl chloride for topical local analgesia.  Completed without difficulty and tolerated well. Medication: triamcinolone acetonide 40 mg/mL suspension for injection 1 mL  total and 2 mL lidocaine 1% without epinephrine utilized for needle  placement anesthetic Advised to contact for fevers/chills, erythema, induration, drainage, or  persistent bleeding.  Note: Reviewed        Physical Exam  General appearance: Well nourished, well developed, and well hydrated. In no apparent acute distress Mental status: Alert, oriented x 3 (person, place, & time)       Respiratory: No evidence of acute respiratory distress Eyes: PERLA Vitals: BP 130/85 (BP Location: Right Arm, Patient Position: Sitting, Cuff Size: Normal)   Pulse 84   Temp 97.6 F (36.4 C) (Temporal)   Resp 16   Ht 5\' 3"  (1.6 m)   Wt 147 lb (66.7 kg)   LMP 04/02/2014   SpO2 98%   BMI 26.04 kg/m  BMI: Estimated body mass index is 26.04 kg/m as calculated from the following:   Height as of this encounter: 5\' 3"  (1.6 m).   Weight as of this encounter: 147 lb (66.7 kg). Ideal: Ideal body weight: 52.4 kg (115 lb 8.3  oz) Adjusted ideal body weight: 58.1 kg (128 lb 1.8 oz)   Lumbar Spine Area Exam  Skin & Axial Inspection: No masses, redness, or swelling  Alignment: Symmetrical Functional ROM: Pain restricted ROM affecting primarily the left Stability: No instability detected Muscle Tone/Strength: Functionally intact. No obvious neuro-muscular anomalies detected. Sensory (Neurological): Improved left L4-L5  Gait & Posture Assessment  Ambulation: Unassisted Gait: Relatively normal for age and body habitus Posture: WNL  Lower Extremity Exam    Side: Right lower extremity  Side: Left lower extremity  Stability: No instability observed          Stability: No instability observed          Skin & Extremity Inspection: Skin color, temperature, and hair growth are WNL. No peripheral edema or cyanosis. No masses, redness, swelling, asymmetry, or associated skin lesions. No contractures.  Skin & Extremity Inspection: Skin color, temperature, and hair growth are WNL. No peripheral edema or cyanosis. No masses, redness, swelling, asymmetry, or associated skin lesions. No contractures.  Functional ROM: Pain restricted ROM for hip joint          Functional ROM: Pain restricted ROM for hip joint          Muscle Tone/Strength: Functionally intact. No obvious neuro-muscular anomalies detected.  Muscle Tone/Strength: Functionally intact. No obvious neuro-muscular anomalies detected.  Sensory (Neurological): Unimpaired        Sensory (Neurological): Unimpaired        DTR: Patellar: deferred today Achilles: deferred today Plantar: deferred today  DTR: Patellar: deferred today Achilles: deferred today Plantar: deferred today  Palpation: No palpable anomalies  Palpation: No palpable anomalies     Assessment   Status Diagnosis  Controlled Controlled Controlled 1. Primary osteoarthritis of left hip   2. Chronic musculoskeletal pain   3. Chronic pain syndrome   4. Neural foraminal stenosis of lumbar spine   5.  Psoriatic arthritis (HCC)            Plan of Care  Ms. LATORSHA KOGAN has a current medication list which includes the following long-term medication(s): gabapentin and quetiapine.  Continue with physical therapy Refill of tramadol and oxycodone as below Increase gabapentin from 400 mg to 600 mg nightly for restless leg syndrome Refill of Flexeril as below  Pharmacotherapy (Medications Ordered): Meds ordered this encounter  Medications   traMADol (ULTRAM-ER) 200 MG 24 hr tablet    Sig: Take 1 tablet (200 mg total) by mouth daily.    Dispense:  30 tablet    Refill:  2   oxyCODONE-acetaminophen (PERCOCET/ROXICET) 5-325 MG tablet    Sig: Take 1 tablet by mouth every 8 (eight) hours as needed for severe pain.    Dispense:  90 tablet    Refill:  0   DISCONTD: gabapentin (NEURONTIN) 400 MG capsule    Sig: Take 1 capsule (400 mg total) by mouth at bedtime.    Dispense:  90 capsule    Refill:  2   cyclobenzaprine (FLEXERIL) 5 MG tablet    Sig: Take 1 tablet (5 mg total) by mouth at bedtime as needed for muscle spasms.    Dispense:  90 tablet    Refill:  1   gabapentin (NEURONTIN) 600 MG tablet    Sig: Take 1 tablet (600 mg total) by mouth at bedtime.    Dispense:  90 tablet    Refill:  1    Fill one day early if pharmacy is closed on scheduled refill date. May substitute for generic if available.   No orders of the defined types were placed in this encounter.    Follow-up plan:   Return in about 3 months (  around 07/22/2023).   Recent Visits No visits were found meeting these conditions. Showing recent visits within past 90 days and meeting all other requirements Today's Visits Date Type Provider Dept  04/21/23 Office Visit Edward Jolly, MD [facility]  Showing today's visits and meeting all other requirements Future Appointments Date Type Provider Dept  07/14/23 Appointment Edward Jolly, MD [facility]  Showing future appointments within next 90  days and meeting all other requirements  I discussed the assessment and treatment plan with the patient. The patient was provided an opportunity to ask questions and all were answered. The patient agreed with the plan and demonstrated an understanding of the instructions.  Patient advised to call back or seek an in-person evaluation if the symptoms or condition worsens.  Duration of encounter:23minutes.  Note by: Edward Jolly, MD Date: 04/21/2023; Time: 3:01 PM

## 2023-05-20 ENCOUNTER — Encounter: Payer: Self-pay | Admitting: Family Medicine

## 2023-05-20 DIAGNOSIS — F419 Anxiety disorder, unspecified: Secondary | ICD-10-CM | POA: Diagnosis not present

## 2023-05-20 DIAGNOSIS — F4312 Post-traumatic stress disorder, chronic: Secondary | ICD-10-CM | POA: Diagnosis not present

## 2023-05-20 DIAGNOSIS — F39 Unspecified mood [affective] disorder: Secondary | ICD-10-CM | POA: Diagnosis not present

## 2023-06-04 ENCOUNTER — Ambulatory Visit: Payer: Federal, State, Local not specified - PPO | Admitting: Family Medicine

## 2023-06-04 ENCOUNTER — Other Ambulatory Visit (INDEPENDENT_AMBULATORY_CARE_PROVIDER_SITE_OTHER): Payer: Federal, State, Local not specified - PPO | Admitting: Radiology

## 2023-06-04 ENCOUNTER — Encounter: Payer: Self-pay | Admitting: Family Medicine

## 2023-06-04 VITALS — BP 118/74 | HR 80 | Ht 63.0 in | Wt 146.0 lb

## 2023-06-04 DIAGNOSIS — M722 Plantar fascial fibromatosis: Secondary | ICD-10-CM

## 2023-06-04 MED ORDER — TRIAMCINOLONE ACETONIDE 40 MG/ML IJ SUSP
40.0000 mg | Freq: Once | INTRAMUSCULAR | Status: AC
Start: 2023-06-04 — End: 2023-06-04
  Administered 2023-06-04: 40 mg via INTRAMUSCULAR

## 2023-06-04 NOTE — Assessment & Plan Note (Signed)
Patient presents with acute on chronic right heel pain, known prior plantar fasciitis which responded very well to cortisone injection performed greater than 1 year prior.  She has noted the symptoms gradually progressed, attributes them to shoe lift.  Exam is consistent with focal plantar fasciitis pain.  Plan: - Patient elected proceed with ultrasound-guided right plantar fascia injection - Post care reviewed and home exercises provided - Can follow-up as needed

## 2023-06-04 NOTE — Patient Instructions (Addendum)
You have just been given a cortisone injection to reduce pain and inflammation. After the injection you may notice immediate relief of pain as a result of the Lidocaine. It is important to rest the area of the injection for 24 to 48 hours after the injection. There is a possibility of some temporary increased discomfort and swelling for up to 72 hours until the cortisone begins to work. If you do have pain, simply rest the joint and use ice. If you can tolerate over the counter medications, you can try Tylenol, Aleve, or Advil for added relief per package instructions. - As above, relative rest for 2 days and gradual return to normal activity - Once symptoms allow, start home exercises - Follow-up as needed contact for any questions

## 2023-06-04 NOTE — Progress Notes (Signed)
Primary Care / Sports Medicine Office Visit  Patient Information:  Patient ID: Barbara Faulkner, female DOB: [birth year] Age: 57 y.o. MRN: [id number]   Barbara Faulkner is a pleasant 57 y.o. female presenting with the following:  Chief Complaint  Patient presents with   Plantar Fasciitis    Right, 1 month    Vitals:   06/04/23 1405  BP: 118/74  Pulse: 80  SpO2: 99%   Vitals:   06/04/23 1405  Weight: 146 lb (66.2 kg)  Height: 5\' 3"  (1.6 m)   Body mass index is 25.86 kg/m.  No results found.   Independent interpretation of notes and tests performed by another provider:   None  Procedures performed:   Procedure:  Injection of right plantar fascia origin under ultrasound guidance. Ultrasound guidance utilized for in-plane approach to plantar fascia origin Samsung HS60 device utilized with permanent recording / reporting. Verbal informed consent obtained and verified. Skin prepped in a sterile fashion. Ethyl chloride for topical local analgesia.  Completed without difficulty and tolerated well. Medication: triamcinolone acetonide 40 mg/mL suspension for injection 1 mL total and 2 mL lidocaine 1% without epinephrine utilized for needle placement anesthetic Advised to contact for fevers/chills, erythema, induration, drainage, or persistent bleeding.   Pertinent History, Exam, Impression, and Recommendations:   Problem List Items Addressed This Visit       Musculoskeletal and Integument   Plantar fasciitis - Primary    Patient presents with acute on chronic right heel pain, known prior plantar fasciitis which responded very well to cortisone injection performed greater than 1 year prior.  She has noted the symptoms gradually progressed, attributes them to shoe lift.  Exam is consistent with focal plantar fasciitis pain.  Plan: - Patient elected proceed with ultrasound-guided right plantar fascia injection - Post care reviewed and home exercises provided - Can  follow-up as needed        Orders & Medications Medications:  Meds ordered this encounter  Medications   triamcinolone acetonide (KENALOG-40) injection 40 mg   Orders Placed This Encounter  Procedures   [place] LIMITED JOINT SPACE STRUCTURES LOW RIGHT     No follow-ups on file.     [name], MD, [facility]   Primary Care Sports Medicine Primary Care and Sports Medicine at [facility]

## 2023-06-25 DIAGNOSIS — R809 Proteinuria, unspecified: Secondary | ICD-10-CM | POA: Diagnosis not present

## 2023-06-25 DIAGNOSIS — I10 Essential (primary) hypertension: Secondary | ICD-10-CM | POA: Diagnosis not present

## 2023-06-25 DIAGNOSIS — D509 Iron deficiency anemia, unspecified: Secondary | ICD-10-CM | POA: Diagnosis not present

## 2023-06-25 DIAGNOSIS — D631 Anemia in chronic kidney disease: Secondary | ICD-10-CM | POA: Diagnosis not present

## 2023-06-25 DIAGNOSIS — E559 Vitamin D deficiency, unspecified: Secondary | ICD-10-CM | POA: Diagnosis not present

## 2023-06-25 DIAGNOSIS — Z96642 Presence of left artificial hip joint: Secondary | ICD-10-CM | POA: Insufficient documentation

## 2023-06-25 DIAGNOSIS — N1831 Chronic kidney disease, stage 3a: Secondary | ICD-10-CM | POA: Diagnosis not present

## 2023-07-06 ENCOUNTER — Telehealth: Payer: Self-pay | Admitting: Student in an Organized Health Care Education/Training Program

## 2023-07-06 NOTE — Telephone Encounter (Signed)
PT called stated that she has an new position at works which requires her to sit down all day.PT stated that she is having pain in her hips due to sitting down all day. PT will like to know if Cherylann Ratel will write her a note stating that she needs to stand up for part of the day so she can get an desk. Please give patient a call.TY

## 2023-07-07 ENCOUNTER — Encounter: Payer: Self-pay | Admitting: *Deleted

## 2023-07-07 NOTE — Telephone Encounter (Signed)
Note constructed and given to BL to sign.

## 2023-07-14 ENCOUNTER — Encounter: Payer: Self-pay | Admitting: Student in an Organized Health Care Education/Training Program

## 2023-07-14 ENCOUNTER — Ambulatory Visit
Payer: Federal, State, Local not specified - PPO | Attending: Student in an Organized Health Care Education/Training Program | Admitting: Student in an Organized Health Care Education/Training Program

## 2023-07-14 VITALS — BP 131/91 | HR 93 | Temp 97.8°F | Resp 16 | Ht 63.0 in | Wt 149.0 lb

## 2023-07-14 DIAGNOSIS — M7918 Myalgia, other site: Secondary | ICD-10-CM | POA: Diagnosis not present

## 2023-07-14 DIAGNOSIS — M1612 Unilateral primary osteoarthritis, left hip: Secondary | ICD-10-CM | POA: Diagnosis not present

## 2023-07-14 DIAGNOSIS — M48061 Spinal stenosis, lumbar region without neurogenic claudication: Secondary | ICD-10-CM | POA: Diagnosis not present

## 2023-07-14 DIAGNOSIS — G8929 Other chronic pain: Secondary | ICD-10-CM | POA: Insufficient documentation

## 2023-07-14 DIAGNOSIS — G894 Chronic pain syndrome: Secondary | ICD-10-CM | POA: Diagnosis not present

## 2023-07-14 MED ORDER — OXYCODONE-ACETAMINOPHEN 5-325 MG PO TABS: 1.0000 | ORAL_TABLET | Freq: Three times a day (TID) | ORAL | 0 refills | Status: AC | PRN

## 2023-07-14 MED ORDER — TRAMADOL HCL ER 200 MG PO TB24: 200.0000 mg | ORAL_TABLET | Freq: Every day | ORAL | 2 refills | Status: DC

## 2023-07-14 NOTE — Progress Notes (Signed)
PROVIDER NOTE: Information contained herein reflects review and annotations entered in association with encounter. Interpretation of such information and data should be left to medically-trained personnel. Information provided to patient can be located elsewhere in the medical record under "Patient Instructions". Document created using STT-dictation technology, any transcriptional errors that may result from process are unintentional.    Patient: Barbara Faulkner  Service Category: E/M  Provider: Edward Jolly, MD  DOB: 1965/09/12  DOS: 02/26/2021  Specialty: Interventional Pain Management  MRN: [id number]  Setting: Ambulatory outpatient  PCP: McLean-Scocuzza, Pasty Spillers, MD  Type: Established Patient    Referring Provider: Quentin Ore *  Location: Office  Delivery: Face-to-face     HPI  [name], a 57 y.o. year old female, is here today because of her Chronic pain syndrome [G89.4]. Ms. Haddock primary complain today is Hip Pain (bilateral)  Last encounter: My last encounter with her was on 04/21/23  Pertinent problems: Ms. 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 Pain Assessment: Severity of Chronic pain is reported as a 5 /10. Location: Hip Right, Left/denies. Onset:  . Quality: Sharp. Timing: Constant. Modifying factor(s): medications, stretching. Vitals:  height is 5\' 3"  (1.6 m) and weight is 149 lb (67.6 kg). Her temporal temperature is 97.8 F (36.6 C). Her blood pressure is 131/91 (abnormal) and her pulse is 93. Her respiration is 16 and oxygen saturation is 99%.   Reason for encounter: both, medication management and post-procedure assessment.     Patient has recovered well after her left hip replacement that was done earlier this summer.  She states that her gait has improved.  She states that the first time in many years  she is walking in heels. Otherwise, No change in medical history since last visit.  Patient's pain is at baseline.  Patient continues multimodal pain regimen as prescribed.  States that it provides pain relief and improvement in functional status.   Pharmacotherapy Assessment   Analgesic: Tramadol 200 mg ER, continue oxycodone 5 mg daily for breakthrough pain.     Monitoring: [place] PMP: PDMP reviewed during this encounter.       Pharmacotherapy: No side-effects or adverse reactions reported. Compliance: No problems identified. Effectiveness: Clinically acceptable.  UDS:  Summary  Date Value Ref Range Status  10/01/2022 Note  Final    Comment:    ==================================================================== [employer] [facility] [employer] (MW) ==================================================================== Test                             Result       Flag       Units  Drug Present and Declared for Prescription Verification   Oxycodone                      293          EXPECTED   ng/mg creat   Oxymorphone                    161          EXPECTED   ng/mg creat   Noroxycodone                   707          EXPECTED   ng/mg creat   Noroxymorphone  144          EXPECTED   ng/mg creat    Sources of oxycodone are scheduled prescription medications.    Oxymorphone, noroxycodone, and noroxymorphone are expected    metabolites of oxycodone. Oxymorphone is also available as a    scheduled prescription medication.    Tramadol                       >3937        EXPECTED   ng/mg creat   O-Desmethyltramadol            >3937        EXPECTED   ng/mg creat   N-Desmethyltramadol            >3937        EXPECTED   ng/mg creat    Source of tramadol is a prescription medication. O-desmethyltramadol    and N-desmethyltramadol are expected metabolites of tramadol.  ==================================================================== Test                      Result    Flag   Units      Ref  Range   Creatinine              127              mg/dL      >=40 ==================================================================== Declared Medications:  The flagging and interpretation on this report are based on the  following declared medications.  Unexpected results may arise from  inaccuracies in the declared medications.   **Note: The testing scope of this panel includes these medications:   Oxycodone (Percocet)  Tramadol (Ultram)   **Note: The testing scope of this panel does not include the  following reported medications:   Acetaminophen (Percocet)  Calcitriol  Cholecalciferol  Cyclobenzaprine (Flexeril)  Duloxetine (Cymbalta)  Gabapentin (Neurontin)  Lamotrigine (Lamictal)  Pantoprazole (Protonix)  Quetiapine (Seroquel)  Vitamin D ==================================================================== For clinical consultation, please call [phone]. ====================================================================       ROS  Constitutional: Denies any fever or chills Gastrointestinal: No reported hemesis, hematochezia, vomiting, or acute GI distress Musculoskeletal:  Left hip pain, improving Neurological: No reported episodes of acute onset apraxia, aphasia, dysarthria, agnosia, amnesia, paralysis, loss of coordination, or loss of consciousness  Medication Review  EPINEPHrine, QUEtiapine, calcitRIOL, cyclobenzaprine, gabapentin, lamoTRIgine, oxyCODONE-acetaminophen, and traMADol  History Review  Allergy: Ms. Hibbert is allergic to shellfish allergy and nsaids. Drug: Ms. Kosanke  reports no history of drug use. Alcohol:  reports that she does not currently use alcohol. Tobacco:  reports that she quit smoking about 19 years ago. Her smoking use included cigarettes. She started smoking about 39 years ago. She has a 40 pack-year smoking history. She has never used smokeless tobacco. Social: Ms. Torno  reports that she quit smoking about 19 years ago.  Her smoking use included cigarettes. She started smoking about 39 years ago. She has a 40 pack-year smoking history. She has never used smokeless tobacco. She reports that she does not currently use alcohol. She reports that she does not use drugs. Medical:  has a past medical history of Allergy, Anxiety, Chronic kidney disease, Chronic pain, Chronic sinusitis, COVID-19, Depression, Heart murmur, Hiatal hernia, HPV (human papilloma virus) anogenital infection, Macromastia, Migraine, Psoriatic arthritis (HCC), Shingles, and UTI (urinary tract infection). Surgical: Ms. Yantz  has a past surgical history that includes Cholecystectomy (2009); Tubal ligation (1997); Laparoscopic gastric banding (2008); Gastric bypass; Breast  biopsy (Right, 01/28/2018); Colonoscopy with propofol (N/A, 07/19/2018); Esophagogastroduodenoscopy (egd) with propofol (N/A, 07/19/2018); Breast reduction surgery (Bilateral, 04/03/2020); Colonoscopy with propofol (N/A, 09/17/2021); Colonoscopy with propofol (N/A, 09/18/2021); and Reduction mammaplasty. Family: family history includes HIV in her father; Hepatitis in her maternal uncle; Hyperlipidemia in her mother; Hypertension in her mother; Lung cancer in her maternal grandfather; Stroke in her mother.  Laboratory Chemistry Profile   Renal Lab Results  Component Value Date   BUN 18 06/11/2022   CREATININE 1.10 06/11/2022   BCR NOT APPLICABLE 07/14/2018   GFR 57.17 (L) 06/11/2022   GFRAA >60 02/15/2014   GFRNONAA 51 (L) 05/26/2022     Hepatic Lab Results  Component Value Date   AST 85 (H) 06/11/2022   ALT 90 (H) 06/11/2022   ALBUMIN 4.0 06/11/2022   ALKPHOS 150 (H) 06/11/2022   ALKPHOS 171 (H) 06/11/2022   AMYLASE 33 09/25/2014   LIPASE 124 10/04/2013     Electrolytes Lab Results  Component Value Date   NA 142 06/11/2022   K 4.3 06/11/2022   CL 110 06/11/2022   CALCIUM 8.7 06/11/2022   MG 1.9 07/14/2018   PHOS 5.0 (H) 09/25/2014     Bone Lab Results   Component Value Date   VD25OH 40.34 06/11/2022     Inflammation (CRP: Acute Phase) (ESR: Chronic Phase) Lab Results  Component Value Date   CRP <1.0 03/10/2019   ESRSEDRATE 3 03/10/2019       Note: Above Lab results reviewed.  Recent Imaging Review  [place] LIMITED JOINT SPACE STRUCTURES LOW RIGHT Procedure:  Injection of right plantar fascia origin under ultrasound  guidance. Ultrasound guidance utilized for in-plane approach to plantar fascia  origin Samsung HS60 device utilized with permanent recording / reporting. Verbal informed consent obtained and verified. Skin prepped in a sterile fashion. Ethyl chloride for topical local analgesia.  Completed without difficulty and tolerated well. Medication: triamcinolone acetonide 40 mg/mL suspension for injection 1 mL  total and 2 mL lidocaine 1% without epinephrine utilized for needle  placement anesthetic Advised to contact for fevers/chills, erythema, induration, drainage, or  persistent bleeding.  Note: Reviewed        Physical Exam  General appearance: Well nourished, well developed, and well hydrated. In no apparent acute distress Mental status: Alert, oriented x 3 (person, place, & time)       Respiratory: No evidence of acute respiratory distress Eyes: PERLA Vitals: BP (!) 131/91   Pulse 93   Temp 97.8 F (36.6 C) (Temporal)   Resp 16   Ht 5\' 3"  (1.6 m)   Wt 149 lb (67.6 kg)   LMP 04/02/2014   SpO2 99%   BMI 26.39 kg/m  BMI: Estimated body mass index is 26.39 kg/m as calculated from the following:   Height as of this encounter: 5\' 3"  (1.6 m).   Weight as of this encounter: 149 lb (67.6 kg). Ideal: Ideal body weight: 52.4 kg (115 lb 8.3 oz) Adjusted ideal body weight: 58.5 kg (128 lb 14.6 oz)   Lumbar Spine Area Exam  Skin & Axial Inspection: No masses, redness, or swelling Alignment: Symmetrical Functional ROM: Pain restricted ROM affecting primarily the left Stability: No instability detected Muscle  Tone/Strength: Functionally intact. No obvious neuro-muscular anomalies detected.  Gait & Posture Assessment  Ambulation: Unassisted Gait: Relatively normal for age and body habitus Posture: WNL  Lower Extremity Exam    Side: Right lower extremity  Side: Left lower extremity  Stability: No instability observed  Stability: No instability observed          Skin & Extremity Inspection: Skin color, temperature, and hair growth are WNL. No peripheral edema or cyanosis. No masses, redness, swelling, asymmetry, or associated skin lesions. No contractures.  Skin & Extremity Inspection: Skin color, temperature, and hair growth are WNL. No peripheral edema or cyanosis. No masses, redness, swelling, asymmetry, or associated skin lesions. No contractures.  Functional ROM: Pain restricted ROM for hip joint          Functional ROM: Pain restricted ROM for hip joint          Muscle Tone/Strength: Functionally intact. No obvious neuro-muscular anomalies detected.  Muscle Tone/Strength: Functionally intact. No obvious neuro-muscular anomalies detected.  Sensory (Neurological): Unimpaired        Sensory (Neurological): Unimpaired        DTR: Patellar: deferred today Achilles: deferred today Plantar: deferred today  DTR: Patellar: deferred today Achilles: deferred today Plantar: deferred today  Palpation: No palpable anomalies  Palpation: No palpable anomalies     Assessment   Status Diagnosis  Controlled Controlled Controlled 1. Chronic pain syndrome   2. Primary osteoarthritis of left hip   3. Chronic musculoskeletal pain   4. Neural foraminal stenosis of lumbar spine       Plan of Care  Ms. ANNISA ARNN has a current medication list which includes the following long-term medication(s): gabapentin and quetiapine.   Pharmacotherapy (Medications Ordered): Meds ordered this encounter  Medications   traMADol (ULTRAM-ER) 200 MG 24 hr tablet    Sig: Take 1 tablet (200 mg total) by  mouth daily.    Dispense:  30 tablet    Refill:  2   oxyCODONE-acetaminophen (PERCOCET) 5-325 MG tablet    Sig: Take 1 tablet by mouth every 8 (eight) hours as needed for severe pain (pain score 7-10). Must last 30 days.    Dispense:  90 tablet    Refill:  0    Chronic Pain: STOP Act (Not applicable) Fill 1 day early if closed on refill date. Avoid benzodiazepines within 8 hours of opioids   No orders of the defined types were placed in this encounter.    Follow-up plan:   Return in about 4 months (around 11/05/2023) for MM, F2F.   Recent Visits Date Type Provider Dept  04/21/23 Office Visit Edward Jolly, MD [facility]  Showing recent visits within past 90 days and meeting all other requirements Today's Visits Date Type Provider Dept  07/14/23 Office Visit Edward Jolly, MD [facility]  Showing today's visits and meeting all other requirements Future Appointments No visits were found meeting these conditions. Showing future appointments within next 90 days and meeting all other requirements  I discussed the assessment and treatment plan with the patient. The patient was provided an opportunity to ask questions and all were answered. The patient agreed with the plan and demonstrated an understanding of the instructions.  Patient advised to call back or seek an in-person evaluation if the symptoms or condition worsens.  Duration of encounter:39minutes.  Note by: Edward Jolly, MD Date: 07/14/2023; Time: 3:53 PM

## 2023-07-14 NOTE — Progress Notes (Signed)
Nursing Pain Medication Assessment:  Safety precautions to be maintained throughout the outpatient stay will include: orient to surroundings, keep bed in low position, maintain call bell within reach at all times, provide assistance with transfer out of bed and ambulation.  Medication Inspection Compliance: Pill count conducted under aseptic conditions, in front of the patient. Neither the pills nor the bottle was removed from the patient's sight at any time. Once count was completed pills were immediately returned to the patient in their original bottle.  Medication #1: Tramadol (Ultram) Pill/Patch Count:  3 of 30 pills remain Pill/Patch Appearance: Markings consistent with prescribed medication Bottle Appearance: Standard pharmacy container. Clearly labeled. Filled Date: 37 / 1 / 2024 Last Medication intake:  Today  Medication #2: Oxycodone/APAP Pill/Patch Count:  51 of 90 pills remain Pill/Patch Appearance: Markings consistent with prescribed medication Bottle Appearance: Standard pharmacy container. Clearly labeled. Filled Date: 38 / 01 / 2024 Last Medication intake:  Yesterday Safety precautions to be maintained throughout the outpatient stay will include: orient to surroundings, keep bed in low position, maintain call bell within reach at all times, provide assistance with transfer out of bed and ambulation.

## 2023-08-18 DIAGNOSIS — F39 Unspecified mood [affective] disorder: Secondary | ICD-10-CM | POA: Diagnosis not present

## 2023-08-18 DIAGNOSIS — F4312 Post-traumatic stress disorder, chronic: Secondary | ICD-10-CM | POA: Diagnosis not present

## 2023-08-18 DIAGNOSIS — F419 Anxiety disorder, unspecified: Secondary | ICD-10-CM | POA: Diagnosis not present

## 2023-08-28 ENCOUNTER — Ambulatory Visit: Payer: Federal, State, Local not specified - PPO | Admitting: Nurse Practitioner

## 2023-08-28 ENCOUNTER — Encounter: Payer: Self-pay | Admitting: Nurse Practitioner

## 2023-08-28 VITALS — BP 110/70 | HR 105 | Temp 97.9°F | Resp 18 | Ht 63.0 in | Wt 154.1 lb

## 2023-08-28 DIAGNOSIS — M25511 Pain in right shoulder: Secondary | ICD-10-CM | POA: Diagnosis not present

## 2023-08-28 NOTE — Progress Notes (Unsigned)
Established Patient Office Visit  Subjective:  Patient ID: Barbara Faulkner, female    DOB: 01/22/1966  Age: 57 y.o. MRN: [id number]  CC:  Chief Complaint  Patient presents with   Shoulder Pain    X last 3 days Under should blade    HPI  Barbara Faulkner presents for:  Shoulder Pain  The pain is present in the right shoulder. This is a new problem. The current episode started in the past 7 days. There has been no history of extremity trauma. The problem occurs constantly. The problem has been resolved. The quality of the pain is described as aching. Pertinent negatives include no fever, inability to bear weight, numbness, stiffness or tingling. She has tried nothing for the symptoms.     Past Medical History:  Diagnosis Date   Allergy    Anxiety    Chronic kidney disease    Chronic pain    Chronic sinusitis    COVID-19    09/14/20, 06/13/21   Depression    Heart murmur    Hiatal hernia    HPV (human papilloma virus) anogenital infection    Macromastia    Migraine    Psoriatic arthritis (HCC)    Shingles    UTI (urinary tract infection)    ecoli 09/2021    Past Surgical History:  Procedure Laterality Date   BREAST BIOPSY Right 01/28/2018   US guided biopsy - heart shaped   BREAST REDUCTION SURGERY Bilateral 04/03/2020   Procedure: MAMMARY REDUCTION  (BREAST);  Surgeon: Contogiannis, Chales Abrahams, MD;  Location: [facility];  Service: Plastics;  Laterality: Bilateral;  HAVE LIPOSUCTION MACHINE AVAILABLE   CHOLECYSTECTOMY  2009   COLONOSCOPY WITH PROPOFOL N/A 07/19/2018   Procedure: COLONOSCOPY WITH PROPOFOL;  Surgeon: Toney Reil, MD;  Location: [facility] ENDOSCOPY;  Service: Gastroenterology;  Laterality: N/A;   COLONOSCOPY WITH PROPOFOL N/A 09/17/2021   Procedure: COLONOSCOPY WITH PROPOFOL;  Surgeon: Toney Reil, MD;  Location: [facility] ENDOSCOPY;  Service: Gastroenterology;  Laterality: N/A;   COLONOSCOPY WITH PROPOFOL N/A 09/18/2021   Procedure:  COLONOSCOPY WITH PROPOFOL;  Surgeon: Toney Reil, MD;  Location: [facility] ENDOSCOPY;  Service: Gastroenterology;  Laterality: N/A;   ESOPHAGOGASTRODUODENOSCOPY (EGD) WITH PROPOFOL N/A 07/19/2018   Procedure: ESOPHAGOGASTRODUODENOSCOPY (EGD) WITH PROPOFOL;  Surgeon: Toney Reil, MD;  Location: [facility] ENDOSCOPY;  Service: Gastroenterology;  Laterality: N/A;   GASTRIC BYPASS     2015; duodenal switch    LAPAROSCOPIC GASTRIC BANDING  2008   removed 2009   REDUCTION MAMMAPLASTY     TUBAL LIGATION  1997    Family History  Problem Relation Age of Onset   Hypertension Mother    Hyperlipidemia Mother    Stroke Mother        age 23   HIV Father    Lung cancer Maternal Grandfather        smoker   Hepatitis Maternal Uncle        drug use   Colon cancer Neg Hx    Colon polyps Neg Hx    Rectal cancer Neg Hx    Stomach cancer Neg Hx    Breast cancer Neg Hx     Social History   Socioeconomic History   Marital status: Married    Spouse name: Shamel Lauwers   Number of children: 2   Years of education: Not on file   Highest education level: Some college, no degree  Occupational History   Occupation: Forensic scientist, Programmer, applications  Employer: [employer]  Tobacco Use   Smoking status: Former    Current packs/day: 0.00    Average packs/day: 2.0 packs/day for 20.0 years (40.0 ttl pk-yrs)    Types: Cigarettes    Start date: 09/02/1983    Quit date: 09/02/2003    Years since quitting: 20.0   Smokeless tobacco: Never  Vaping Use   Vaping status: Never Used  Substance and Sexual Activity   Alcohol use: Not Currently   Drug use: Never   Sexual activity: Yes    Partners: Male    Birth control/protection: Post-menopausal  Other Topics Concern   Not on file  Social History Narrative   Married.   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    Has 2 dogs and 2 cats   Social Drivers of Corporate investment banker Strain: Low Risk  (03/10/2023)   Overall Financial Resource Strain (CARDIA)    Difficulty of Paying Living Expenses: Not hard at all  Food Insecurity: No Food Insecurity (03/10/2023)   Hunger Vital Sign    Worried About Running Out of Food in the Last Year: Never true    Ran Out of Food in the Last Year: Never true  Transportation Needs: No Transportation Needs (03/10/2023)   PRAPARE - Administrator, Civil Service (Medical): No    Lack of Transportation (Non-Medical): No  Physical Activity: Unknown (03/10/2023)   Exercise Vital Sign    Days of Exercise per Week: 0 days    Minutes of Exercise per Session: Not on file  Stress: Stress Concern Present (03/10/2023)   [employer] of Occupational Health - Occupational Stress Questionnaire    Feeling of Stress : To some extent  Social Connections: Socially Integrated (03/10/2023)   Social Connection and Isolation Panel [NHANES]    Frequency of Communication with Friends and Family: More than three times a week    Frequency of Social Gatherings with Friends and Family: Once a week    Attends Religious Services: More than 4 times per year    Active Member of [employer] or Organizations: Yes    Attends Engineer, structural: More than 4 times per year    Marital Status: Married  Catering manager Violence: Not on file     Outpatient Medications Prior to Visit  Medication Sig Dispense Refill   calcitRIOL (ROCALTROL) 0.25 MCG capsule Take 0.25 mcg by mouth daily.     cyclobenzaprine (FLEXERIL) 5 MG tablet Take 1 tablet (5 mg total) by mouth at bedtime as needed for muscle spasms. 90 tablet 1   EPINEPHrine 0.3 mg/0.3 mL IJ SOAJ injection Inject into the muscle.     gabapentin (NEURONTIN) 600 MG tablet Take 1 tablet (600 mg total) by mouth at bedtime. 90 tablet 1   lamoTRIgine (LAMICTAL) 150 MG tablet Take 150 mg by mouth daily.     [START ON  09/02/2023] oxyCODONE-acetaminophen (PERCOCET) 5-325 MG tablet Take 1 tablet by mouth every 8 (eight) hours as needed for severe pain (pain score 7-10). Must last 30 days. 90 tablet 0   QUEtiapine (SEROQUEL) 100 MG tablet Take 100 mg by mouth at bedtime.     traMADol (ULTRAM-ER) 200 MG 24 hr tablet Take 1 tablet (200 mg total) by mouth daily. 30 tablet  2   Facility-Administered Medications Prior to Visit  Medication Dose Route Frequency Provider Last Rate Last Admin   lidocaine (PF) (XYLOCAINE) 1 % injection 2 mL  2 mL Intradermal Once [name], MD        Allergies  Allergen Reactions   Shellfish Allergy Swelling and Anaphylaxis    REACTION: throat closing   Nsaids     All NSAIDS -h/o CKD 2-3     ROS Review of Systems  Constitutional:  Negative for fever.  Musculoskeletal:  Negative for stiffness.  Neurological:  Negative for tingling and numbness.   Negative unless indicated in HPI.    Objective:    Physical Exam  BP 110/70   Pulse (!) 105   Temp 97.9 F (36.6 C)   Resp 18   Ht 5\' 3"  (1.6 m)   Wt 154 lb 2 oz (69.9 kg)   LMP 04/02/2014   SpO2 98%   BMI 27.30 kg/m  Wt Readings from Last 3 Encounters:  08/28/23 154 lb 2 oz (69.9 kg)  07/14/23 149 lb (67.6 kg)  06/04/23 146 lb (66.2 kg)     Health Maintenance  Topic Date Due   COVID-19 Vaccine (3 - Pfizer risk series) 01/05/2020   Cervical Cancer Screening (Pap smear)  06/14/2023   INFLUENZA VACCINE  11/30/2023 (Originally 04/02/2023)   MAMMOGRAM  12/01/2023   Colonoscopy  09/18/2024   DTaP/Tdap/Td (3 - Td or Tdap) 09/21/2031   Hepatitis C Screening  Completed   HIV Screening  Completed   Zoster Vaccines- Shingrix  Completed   HPV VACCINES  Aged Out    There are no preventive care reminders to display for this patient.  Lab Results  Component Value Date   TSH 0.68 06/11/2022   Lab Results  Component Value Date   WBC 9.6 06/11/2022   HGB 10.9 (L) 06/11/2022   HCT 34.8 (L) 06/11/2022   MCV 86.1  06/11/2022   PLT 174.0 06/11/2022   Lab Results  Component Value Date   NA 142 06/11/2022   K 4.3 06/11/2022   CO2 25 06/11/2022   GLUCOSE 96 06/11/2022   BUN 18 06/11/2022   CREATININE 1.10 06/11/2022   BILITOT 0.9 06/11/2022   ALKPHOS 150 (H) 06/11/2022   ALKPHOS 171 (H) 06/11/2022   AST 85 (H) 06/11/2022   ALT 90 (H) 06/11/2022   PROT 6.1 06/11/2022   ALBUMIN 4.0 06/11/2022   CALCIUM 8.7 06/11/2022   ANIONGAP 6 05/26/2022   GFR 57.17 (L) 06/11/2022   Lab Results  Component Value Date   CHOL 122 06/11/2022   Lab Results  Component Value Date   HDL 38.20 (L) 06/11/2022   Lab Results  Component Value Date   LDLCALC 77 06/11/2022   Lab Results  Component Value Date   TRIG 37.0 06/11/2022   Lab Results  Component Value Date   CHOLHDL 3 06/11/2022   Lab Results  Component Value Date   HGBA1C 4.9 06/11/2022      Assessment & Plan:  There are no diagnoses linked to this encounter.  Follow-up: No follow-ups on file.   Kara Dies, NP

## 2023-08-30 ENCOUNTER — Encounter: Payer: Self-pay | Admitting: Nurse Practitioner

## 2023-08-30 NOTE — Assessment & Plan Note (Signed)
History of shoulder pain from last 3 day, now resolved.  No pain reported during examination. Advised patient to monitor for the reoccurrence of pain and other concerns symptoms follow-up as needed if pain reoccurs.

## 2023-09-07 DIAGNOSIS — Z96642 Presence of left artificial hip joint: Secondary | ICD-10-CM | POA: Diagnosis not present

## 2023-10-01 ENCOUNTER — Ambulatory Visit
Payer: Federal, State, Local not specified - PPO | Attending: Student in an Organized Health Care Education/Training Program | Admitting: Student in an Organized Health Care Education/Training Program

## 2023-10-01 ENCOUNTER — Encounter: Payer: Self-pay | Admitting: Student in an Organized Health Care Education/Training Program

## 2023-10-01 VITALS — BP 139/90 | HR 96 | Temp 97.3°F | Resp 16 | Ht 63.0 in | Wt 154.0 lb

## 2023-10-01 DIAGNOSIS — M1612 Unilateral primary osteoarthritis, left hip: Secondary | ICD-10-CM | POA: Diagnosis not present

## 2023-10-01 DIAGNOSIS — G894 Chronic pain syndrome: Secondary | ICD-10-CM

## 2023-10-01 DIAGNOSIS — M722 Plantar fascial fibromatosis: Secondary | ICD-10-CM | POA: Diagnosis not present

## 2023-10-01 DIAGNOSIS — G8929 Other chronic pain: Secondary | ICD-10-CM | POA: Insufficient documentation

## 2023-10-01 DIAGNOSIS — M7918 Myalgia, other site: Secondary | ICD-10-CM | POA: Diagnosis not present

## 2023-10-01 NOTE — Patient Instructions (Signed)
Right foot injection.

## 2023-10-01 NOTE — Progress Notes (Signed)
PROVIDER NOTE: Information contained herein reflects review and annotations entered in association with encounter. Interpretation of such information and data should be left to medically-trained personnel. Information provided to patient can be located elsewhere in the medical record under "Patient Instructions". Document created using STT-dictation technology, any transcriptional errors that may result from process are unintentional.    Patient: [name]  Service Category: E/M  Provider: Edward Jolly, MD  DOB: August 13, 1966  DOS: 10/01/2023  Referring Provider: Allegra Grana, FNP  MRN: [id number]  Specialty: Interventional Pain Management  PCP: Allegra Grana, FNP  Type: Established Patient  Setting: Ambulatory outpatient    Location: Office  Delivery: Face-to-face     HPI  Ms. AMBERLE LYTER, a 58 y.o. year old female, is here today because of her Plantar fasciitis of right foot [M72.2]. Ms. Jersey primary complain today is Foot Pain (Right )  Pertinent problems: Ms. 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 Pain Assessment: Severity of Chronic pain is reported as a 10-Worst pain ever/10. Location: Foot Right/mainly in the heel of right foot. Onset: More than a month ago. Quality: Discomfort, Constant, Sharp. Timing: Constant. Modifying factor(s): pain medications help while sitting but now while wlking. Vitals:  height is 5\' 3"  (1.6 m) and weight is 154 lb (69.9 kg). Her temporal temperature is 97.3 F (36.3 C) (abnormal). Her blood pressure is 139/90 (abnormal) and her pulse is 96. Her respiration is 16 and oxygen saturation is 100%.  BMI: Estimated body mass index is 27.28 kg/m as calculated from the following:   Height as of this encounter: 5\' 3"  (1.6 m).   Weight as of this encounter: 154 lb (69.9 kg). Last encounter:  07/14/2023. Last procedure: 11/10/2022.  Reason for encounter:   History of Present Illness   The patient presents with right foot pain due to plantar fasciitis.  They have been experiencing right foot pain due to plantar fasciitis for approximately two years, with the pain localized to the heel. Previous injections provided relief for about six months. The pain is exacerbated by wearing wedges in their shoes, which they use to manage a limp caused by a leg length discrepancy.  A history of hip surgery has resulted in their right leg being shorter than the left, causing a limp. They wear wedges to manage the limp, but this worsens the plantar fasciitis pain. They have tried using gel inserts provided by their physical therapist to alleviate the heel pain. The pain from plantar fasciitis is more bothersome than the limp caused by the leg length discrepancy.       Pharmacotherapy Assessment  Analgesic: tramadol 100 mg ER, continue oxycodone 5 mg daily for breakthrough pain.     Monitoring: [place] PMP: PDMP not reviewed this encounter.       Pharmacotherapy: No side-effects or adverse reactions reported. Compliance: No problems identified. Effectiveness: Clinically acceptable.  Vernie Ammons, RN  10/01/2023  1:08 PM  Sign when Signing Visit Safety precautions to be maintained throughout the outpatient stay will include: orient to surroundings, keep bed in low position, maintain call bell within reach at all times, provide assistance with transfer out of bed and ambulation.   No results found for: "CBDTHCR" No results found for: "D8THCCBX" No results found for: "D9THCCBX"  UDS:  Summary  Date Value Ref Range Status  10/01/2022 Note  Final  Comment:    ==================================================================== [employer] [facility] 13 (MW) ==================================================================== Test                             Result       Flag       Units  Drug  Present and Declared for Prescription Verification   Oxycodone                      293          EXPECTED   ng/mg creat   Oxymorphone                    161          EXPECTED   ng/mg creat   Noroxycodone                   707          EXPECTED   ng/mg creat   Noroxymorphone                 144          EXPECTED   ng/mg creat    Sources of oxycodone are scheduled prescription medications.    Oxymorphone, noroxycodone, and noroxymorphone are expected    metabolites of oxycodone. Oxymorphone is also available as a    scheduled prescription medication.    Tramadol                       >3937        EXPECTED   ng/mg creat   O-Desmethyltramadol            >3937        EXPECTED   ng/mg creat   N-Desmethyltramadol            >3937        EXPECTED   ng/mg creat    Source of tramadol is a prescription medication. O-desmethyltramadol    and N-desmethyltramadol are expected metabolites of tramadol.  ==================================================================== Test                      Result    Flag   Units      Ref Range   Creatinine              127              mg/dL      >=62 ==================================================================== Declared Medications:  The flagging and interpretation on this report are based on the  following declared medications.  Unexpected results may arise from  inaccuracies in the declared medications.   **Note: The testing scope of this panel includes these medications:   Oxycodone (Percocet)  Tramadol (Ultram)   **Note: The testing scope of this panel does not include the  following reported medications:   Acetaminophen (Percocet)  Calcitriol  Cholecalciferol  Cyclobenzaprine (Flexeril)  Duloxetine (Cymbalta)  Gabapentin (Neurontin)  Lamotrigine (Lamictal)  Pantoprazole (Protonix)  Quetiapine (Seroquel)  Vitamin D ==================================================================== For clinical consultation, please call [phone]. ====================================================================       ROS  Constitutional: Denies any fever or chills Gastrointestinal: No reported hemesis, hematochezia, vomiting, or acute GI distress Musculoskeletal:  Right plantar fascia pain Neurological: No reported episodes of acute onset apraxia, aphasia, dysarthria, agnosia, amnesia, paralysis, loss of coordination, or loss of consciousness  Medication Review  EPINEPHrine, QUEtiapine, calcitRIOL, cyclobenzaprine,  gabapentin, lamoTRIgine, oxyCODONE-acetaminophen, and traMADol  History Review  Allergy: Ms. Parcher is allergic to shellfish allergy and nsaids. Drug: Ms. Roszak  reports no history of drug use. Alcohol:  reports that she does not currently use alcohol. Tobacco:  reports that she quit smoking about 20 years ago. Her smoking use included cigarettes. She started smoking about 40 years ago. She has a 40 pack-year smoking history. She has never used smokeless tobacco. Social: Ms. Sandeen  reports that she quit smoking about 20 years ago. Her smoking use included cigarettes. She started smoking about 40 years ago. She has a 40 pack-year smoking history. She has never used smokeless tobacco. She reports that she does not currently use alcohol. She reports that she does not use drugs. Medical:  has a past medical history of Allergy, Anxiety, Chronic kidney disease, Chronic pain, Chronic sinusitis, COVID-19, Depression, Heart murmur, Hiatal hernia, HPV (human papilloma virus) anogenital infection, Macromastia, Migraine, Psoriatic arthritis (HCC), Shingles, and UTI (urinary tract infection). Surgical: Ms. Altemus  has a past surgical history that includes Cholecystectomy (2009); Tubal ligation (1997); Laparoscopic gastric banding (2008); Gastric bypass; Breast biopsy (Right, 01/28/2018); Colonoscopy with propofol (N/A, 07/19/2018); Esophagogastroduodenoscopy (egd) with propofol (N/A, 07/19/2018); Breast reduction  surgery (Bilateral, 04/03/2020); Colonoscopy with propofol (N/A, 09/17/2021); Colonoscopy with propofol (N/A, 09/18/2021); and Reduction mammaplasty. Family: family history includes HIV in her father; Hepatitis in her maternal uncle; Hyperlipidemia in her mother; Hypertension in her mother; Lung cancer in her maternal grandfather; Stroke in her mother.  Laboratory Chemistry Profile   Renal Lab Results  Component Value Date   BUN 18 06/11/2022   CREATININE 1.10 06/11/2022   BCR NOT APPLICABLE 07/14/2018   GFR 57.17 (L) 06/11/2022   GFRAA >60 02/15/2014   GFRNONAA 51 (L) 05/26/2022    Hepatic Lab Results  Component Value Date   AST 85 (H) 06/11/2022   ALT 90 (H) 06/11/2022   ALBUMIN 4.0 06/11/2022   ALKPHOS 150 (H) 06/11/2022   ALKPHOS 171 (H) 06/11/2022   AMYLASE 33 09/25/2014   LIPASE 124 10/04/2013    Electrolytes Lab Results  Component Value Date   NA 142 06/11/2022   K 4.3 06/11/2022   CL 110 06/11/2022   CALCIUM 8.7 06/11/2022   MG 1.9 07/14/2018   PHOS 5.0 (H) 09/25/2014    Bone Lab Results  Component Value Date   VD25OH 40.34 06/11/2022    Inflammation (CRP: Acute Phase) (ESR: Chronic Phase) Lab Results  Component Value Date   CRP <1.0 03/10/2019   ESRSEDRATE 3 03/10/2019         Note: Above Lab results reviewed.  Recent Imaging Review  [place] LIMITED JOINT SPACE STRUCTURES LOW RIGHT Procedure:  Injection of right plantar fascia origin under ultrasound  guidance. Ultrasound guidance utilized for in-plane approach to plantar fascia  origin Samsung HS60 device utilized with permanent recording / reporting. Verbal informed consent obtained and verified. Skin prepped in a sterile fashion. Ethyl chloride for topical local analgesia.  Completed without difficulty and tolerated well. Medication: triamcinolone acetonide 40 mg/mL suspension for injection 1 mL  total and 2 mL lidocaine 1% without epinephrine utilized for needle  placement anesthetic Advised to  contact for fevers/chills, erythema, induration, drainage, or  persistent bleeding. Note: Reviewed        Physical Exam  General appearance: Well nourished, well developed, and well hydrated. In no apparent acute distress Mental status: Alert, oriented x 3 (person, place, & time)       Respiratory: No evidence of  acute respiratory distress Eyes: PERLA Vitals: BP (!) 139/90 (BP Location: Right Arm, Patient Position: Sitting, Cuff Size: Normal)   Pulse 96   Temp (!) 97.3 F (36.3 C) (Temporal)   Resp 16   Ht 5\' 3"  (1.6 m)   Wt 154 lb (69.9 kg)   LMP 04/02/2014   SpO2 100%   BMI 27.28 kg/m  BMI: Estimated body mass index is 27.28 kg/m as calculated from the following:   Height as of this encounter: 5\' 3"  (1.6 m).   Weight as of this encounter: 154 lb (69.9 kg). Ideal: Ideal body weight: 52.4 kg (115 lb 8.3 oz) Adjusted ideal body weight: 59.4 kg (130 lb 14.6 oz)  Tenderness along heel of right foot  Assessment   Diagnosis Status  1. Plantar fasciitis of right foot   2. Primary osteoarthritis of left hip   3. Chronic musculoskeletal pain   4. Chronic pain syndrome    Having a Flare-up Having a Flare-up Controlled   Updated Problems: Problem  Chronic Musculoskeletal Pain  Chronic Pain Syndrome  Plantar Fasciitis of Right Foot   Corticosteroid injections: - 04/29/2022 (response for 1 year)     Plan of Care  Problem-specific:  Assessment and Plan    Plantar Fasciitis Chronic plantar fasciitis in the right heel has persisted for two years, worsened by wedges used for leg length discrepancy. Previous injections provided six months of relief. Benefits of injection for pain relief were discussed, and removal of wedges was advised until the injection. Schedule the injection for Thursday, October 08, 2023, in the afternoon.  Leg Length Discrepancy A post-surgical leg length discrepancy with the right leg shorter causes a limp. Wedges used to mitigate the limp aggravate  plantar fasciitis. Potential future hip surgery for correction was discussed, but it is advised against without significant pathology and pain due to its major nature. Consider custom wedges with gel inserts for plantar fasciitis if needed.  Follow-up A follow-up appointment is scheduled for Thursday, October 08, 2023, in the afternoon.       Ms. JULL HARRAL has a current medication list which includes the following long-term medication(s): gabapentin and quetiapine.  Pharmacotherapy (Medications Ordered): No orders of the defined types were placed in this encounter.  Orders:  Orders Placed This Encounter  Procedures   Injection tendon or ligament    Standing Status:   Future    Expiration Date:   12/30/2023    Scheduling Instructions:     Right plantar fascia injection   Follow-up plan:   Return in about 1 week (around 10/08/2023) for right plantar fasica injection (in room).      Recent Visits Date Type Provider Dept  07/14/23 Office Visit Edward Jolly, MD [facility]  Showing recent visits within past 90 days and meeting all other requirements Today's Visits Date Type Provider Dept  10/01/23 Office Visit Edward Jolly, MD [facility]  Showing today's visits and meeting all other requirements Future Appointments Date Type Provider Dept  11/03/23 Appointment Edward Jolly, MD [facility]  Showing future appointments within next 90 days and meeting all other requirements  I discussed the assessment and treatment plan with the patient. The patient was provided an opportunity to ask questions and all were answered. The patient agreed with the plan and demonstrated an understanding of the instructions.  Patient advised to call back or seek an in-person evaluation if the symptoms or condition worsens.  Duration of encounter: .  Total time on  encounter, as per AMA guidelines included both the face-to-face and non-face-to-face time  personally spent by the physician and/or other qualified health care professional(s) on the day of the encounter (includes time in activities that require the physician or other qualified health care professional and does not include time in activities normally performed by clinical staff). Physician's time may include the following activities when performed: Preparing to see the patient (e.g., pre-charting review of records, searching for previously ordered imaging, lab work, and nerve conduction tests) Review of prior analgesic pharmacotherapies. Reviewing PMP Interpreting ordered tests (e.g., lab work, imaging, nerve conduction tests) Performing post-procedure evaluations, including interpretation of diagnostic procedures Obtaining and/or reviewing separately obtained history Performing a medically appropriate examination and/or evaluation Counseling and educating the patient/family/caregiver Ordering medications, tests, or procedures Referring and communicating with other health care professionals (when not separately reported) Documenting clinical information in the electronic or other health record Independently interpreting results (not separately reported) and communicating results to the patient/ family/caregiver Care coordination (not separately reported)  Note by: Edward Jolly, MD Date: 10/01/2023; Time: 2:03 PM

## 2023-10-01 NOTE — Progress Notes (Signed)
Safety precautions to be maintained throughout the outpatient stay will include: orient to surroundings, keep bed in low position, maintain call bell within reach at all times, provide assistance with transfer out of bed and ambulation.

## 2023-10-06 ENCOUNTER — Encounter: Payer: Self-pay | Admitting: Student in an Organized Health Care Education/Training Program

## 2023-10-12 ENCOUNTER — Ambulatory Visit
Payer: Federal, State, Local not specified - PPO | Attending: Student in an Organized Health Care Education/Training Program | Admitting: Student in an Organized Health Care Education/Training Program

## 2023-10-12 ENCOUNTER — Encounter: Payer: Self-pay | Admitting: Student in an Organized Health Care Education/Training Program

## 2023-10-12 VITALS — BP 114/81 | HR 80 | Temp 97.2°F | Resp 16 | Ht 63.0 in | Wt 147.0 lb

## 2023-10-12 DIAGNOSIS — M722 Plantar fascial fibromatosis: Secondary | ICD-10-CM | POA: Insufficient documentation

## 2023-10-12 MED ORDER — LIDOCAINE HCL 2 % IJ SOLN
INTRAMUSCULAR | Status: AC
Start: 2023-10-12 — End: ?
  Filled 2023-10-12: qty 20

## 2023-10-12 MED ORDER — DEXAMETHASONE SODIUM PHOSPHATE 10 MG/ML IJ SOLN
INTRAMUSCULAR | Status: AC
  Filled 2023-10-12: qty 1

## 2023-10-12 MED ORDER — LIDOCAINE HCL 2 % IJ SOLN
20.0000 mL | Freq: Once | INTRAMUSCULAR | Status: AC
  Administered 2023-10-12: 400 mg

## 2023-10-12 MED ORDER — ROPIVACAINE HCL 2 MG/ML IJ SOLN
INTRAMUSCULAR | Status: AC
Start: 2023-10-12 — End: ?
  Filled 2023-10-12: qty 20

## 2023-10-12 MED ORDER — DEXAMETHASONE SODIUM PHOSPHATE 10 MG/ML IJ SOLN
10.0000 mg | Freq: Once | INTRAMUSCULAR | Status: AC
  Administered 2023-10-12: 10 mg

## 2023-10-12 MED ORDER — ROPIVACAINE HCL 2 MG/ML IJ SOLN
4.0000 mL | Freq: Once | INTRAMUSCULAR | Status: AC
  Administered 2023-10-12: 20 mL via INTRA_ARTICULAR

## 2023-10-12 NOTE — Progress Notes (Addendum)
 PROVIDER NOTE: Interpretation of information contained herein should be left to medically-trained personnel. Specific patient instructions are provided elsewhere under "Patient Instructions" section of medical record. This document was created in part using STT-dictation technology, any transcriptional errors that may result from this process are unintentional.  Patient: Barbara Faulkner Type: Established DOB: 1966-01-29 MRN: [id number] PCP: Calista Catching, FNP  Service: Procedure DOS: 10/12/2023 Setting: Ambulatory Location: Ambulatory outpatient facility Delivery: Face-to-face Provider: Cephus Collin, MD Specialty: Interventional Pain Management Specialty designation: 09 Location: Outpatient facility Ref. Prov.: Calista Catching, FNP       Interventional Therapy     Procedure:          Anesthesia, Analgesia, Anxiolysis:  Right plantar fascia injection for right plantar fasciitis  Type: Local Anesthesia Local Anesthetic: Lidocaine  1-2% Sedation: None  Indication(s):  Analgesia Route: Infiltration (Emmett/IM) IV Access: N/A   Position: Supine   1. Plantar fasciitis of right foot    NAS-11 Pain score:   Pre-procedure: 10-Worst pain ever/10   Post-procedure: 0-No pain/10     H&P (Pre-op Assessment):  Barbara Faulkner is a 58 y.o. (year old), female patient, seen today for interventional treatment. She  has a past surgical history that includes Cholecystectomy (2009); Tubal ligation (1997); Laparoscopic gastric banding (2008); Gastric bypass; Breast biopsy (Right, 01/28/2018); Colonoscopy with propofol  (N/A, 07/19/2018); Esophagogastroduodenoscopy (egd) with propofol  (N/A, 07/19/2018); Breast reduction surgery (Bilateral, 04/03/2020); Colonoscopy with propofol  (N/A, 09/17/2021); Colonoscopy with propofol  (N/A, 09/18/2021); and Reduction mammaplasty. Barbara Faulkner has a current medication list which includes the following prescription(s): calcitriol, cyclobenzaprine , epinephrine , gabapentin ,  lamotrigine , quetiapine, and tramadol , and the following Facility-Administered Medications: dexamethasone , lidocaine  (pf), lidocaine , and ropivacaine  (pf) 2 mg/ml (0.2%). Her primarily concern today is the Pain (Right heel)  Initial Vital Signs:  Pulse/HCG Rate: 90  Temp: (!) 97.2 F (36.2 C) Resp: 16 BP: (!) 146/89 SpO2: 100 %  BMI: Estimated body mass index is 26.04 kg/m as calculated from the following:   Height as of this encounter: 5\' 3"  (1.6 m).   Weight as of this encounter: 147 lb (66.7 kg).  Risk Assessment: Allergies: Reviewed. She is allergic to shellfish allergy and nsaids.  Allergy Precautions: None required Coagulopathies: Reviewed. None identified.  Blood-thinner therapy: None at this time Active Infection(s): Reviewed. None identified. Barbara Faulkner is afebrile  Site Confirmation: Barbara Faulkner was asked to confirm the procedure and laterality before marking the site Procedure checklist: Completed Consent: Before the procedure and under the influence of no sedative(s), amnesic(s), or anxiolytics, the patient was informed of the treatment options, risks and possible complications. To fulfill our ethical and legal obligations, as recommended by the American Medical Association's Code of Ethics, I have informed the patient of my clinical impression; the nature and purpose of the treatment or procedure; the risks, benefits, and possible complications of the intervention; the alternatives, including doing nothing; the risk(s) and benefit(s) of the alternative treatment(s) or procedure(s); and the risk(s) and benefit(s) of doing nothing. The patient was provided information about the general risks and possible complications associated with the procedure. These may include, but are not limited to: failure to achieve desired goals, infection, bleeding, organ or nerve damage, allergic reactions, paralysis, and death. In addition, the patient was informed of those risks and complications  associated to the procedure, such as failure to decrease pain; infection; bleeding; organ or nerve damage with subsequent damage to sensory, motor, and/or autonomic systems, resulting in permanent pain, numbness, and/or weakness of one or several areas of the  body; allergic reactions; (i.e.: anaphylactic reaction); and/or death. Furthermore, the patient was informed of those risks and complications associated with the medications. These include, but are not limited to: allergic reactions (i.e.: anaphylactic or anaphylactoid reaction(s)); adrenal axis suppression; blood sugar elevation that in diabetics may result in ketoacidosis or comma; water  retention that in patients with history of congestive heart failure may result in shortness of breath, pulmonary edema, and decompensation with resultant heart failure; weight gain; swelling or edema; medication-induced neural toxicity; particulate matter embolism and blood vessel occlusion with resultant organ, and/or nervous system infarction; and/or aseptic necrosis of one or more joints. Finally, the patient was informed that Medicine is not an exact science; therefore, there is also the possibility of unforeseen or unpredictable risks and/or possible complications that may result in a catastrophic outcome. The patient indicated having understood very clearly. We have given the patient no guarantees and we have made no promises. Enough time was given to the patient to ask questions, all of which were answered to the patient's satisfaction. Barbara Faulkner has indicated that she wanted to continue with the procedure. Attestation: I, the ordering provider, attest that I have discussed with the patient the benefits, risks, side-effects, alternatives, likelihood of achieving goals, and potential problems during recovery for the procedure that I have provided informed consent. Date  Time: 10/12/2023 12:59 PM  Pre-Procedure Preparation:  Monitoring: As per clinic protocol.  Respiration, ETCO2, SpO2, BP, heart rate and rhythm monitor placed and checked for adequate function Safety Precautions: Patient was assessed for positional comfort and pressure points before starting the procedure. Time-out: I initiated and conducted the "Time-out" before starting the procedure, as per protocol. The patient was asked to participate by confirming the accuracy of the "Time Out" information. Verification of the correct person, site, and procedure were performed and confirmed by me, the nursing staff, and the patient. "Time-out" conducted as per Joint Commission's Universal Protocol (UP.01.01.01). Time: 1322 Start Time: 1323 hrs.  Description of Procedure:           The patient was placed in a supine position with the right foot externally rotated for optimal access to the plantar fascia. The foot was prepped with chlorhexidine  in a sterile manner. A skin wheal was raised using 1% lidocaine  with a 27-gauge needle for local anesthesia.  A 27-gauge needle was inserted medial to the plantar fascia insertion at the calcaneus under palpation guidance. After negative aspiration, a combination of 1 mL of Decadron  10mg /cc and 5mL of 0.2% Ropivacaine   was injected into the plantar fascia. The needle was withdrawn, and hemostasis was achieved with direct pressure.   Antibiotic Prophylaxis:   Anti-infectives (From admission, onward)    None      Indication(s): None identified  Post-operative Assessment:  Post-procedure Vital Signs:  Pulse/HCG Rate: 80  Temp: (!) 97.2 F (36.2 C) Resp: 16 BP: 114/81 SpO2: 100 %  EBL: None  Complications: No immediate post-treatment complications observed by team, or reported by patient.  Note: The patient tolerated the entire procedure well. A repeat set of vitals were taken after the procedure and the patient was kept under observation following institutional policy, for this type of procedure. Post-procedural neurological assessment was  performed, showing return to baseline, prior to discharge. The patient was provided with post-procedure discharge instructions, including a section on how to identify potential problems. Should any problems arise concerning this procedure, the patient was given instructions to immediately contact us , at any time, without hesitation. In any case, we  plan to contact the patient by telephone for a follow-up status report regarding this interventional procedure.  Comments:  No additional relevant information.  Plan of Care (POC)  Orders:  No orders of the defined types were placed in this encounter.  Chronic Opioid Analgesic:  tramadol  100 mg ER, continue oxycodone  5 mg daily for breakthrough pain.     Medications ordered for procedure: Meds ordered this encounter  Medications   lidocaine  (XYLOCAINE ) 2 % (with pres) injection 400 mg   ropivacaine  (PF) 2 mg/mL (0.2%) (NAROPIN ) injection 4 mL   dexamethasone  (DECADRON ) injection 10 mg   Medications administered: Fern Hover had no medications administered during this visit.  See the medical record for exact dosing, route, and time of administration.  Follow-up plan:   Return for Keep sch. appt.      Recent Visits Date Type Provider Dept  10/01/23 Office Visit Cephus Collin, MD [facility]  07/14/23 Office Visit Cephus Collin, MD [facility]  Showing recent visits within past 90 days and meeting all other requirements Today's Visits Date Type Provider Dept  10/12/23 Procedure visit Cephus Collin, MD [facility]  Showing today's visits and meeting all other requirements Future Appointments Date Type Provider Dept  11/03/23 Appointment Cephus Collin, MD [facility]  Showing future appointments within next 90 days and meeting all other requirements  Disposition: Discharge home  Discharge (Date  Time): 10/12/2023; 1328 hrs.   Primary Care Physician: Calista Catching, FNP Location: [facility]  Outpatient Pain Management Facility Note by: Cephus Collin, MD (TTS technology used. I apologize for any typographical errors that were not detected and corrected.) Date: 10/12/2023; Time: 2:02 PM  Disclaimer:  Medicine is not an Visual merchandiser. The only guarantee in medicine is that nothing is guaranteed. It is important to note that the decision to proceed with this intervention was based on the information collected from the patient. The Data and conclusions were drawn from the patient's questionnaire, the interview, and the physical examination. Because the information was provided in large part by the patient, it cannot be guaranteed that it has not been purposely or unconsciously manipulated. Every effort has been made to obtain as much relevant data as possible for this evaluation. It is important to note that the conclusions that lead to this procedure are derived in large part from the available data. Always take into account that the treatment will also be dependent on availability of resources and existing treatment guidelines, considered by other Pain Management Practitioners as being common knowledge and practice, at the time of the intervention. For Medico-Legal purposes, it is also important to point out that variation in procedural techniques and pharmacological choices are the acceptable norm. The indications, contraindications, technique, and results of the above procedure should only be interpreted and judged by a Board-Certified Interventional Pain Specialist with extensive familiarity and expertise in the same exact procedure and technique.

## 2023-10-12 NOTE — Progress Notes (Signed)
 Safety precautions to be maintained throughout the outpatient stay will include: orient to surroundings, keep bed in low position, maintain call bell within reach at all times, provide assistance with transfer out of bed and ambulation.

## 2023-10-12 NOTE — Patient Instructions (Signed)

## 2023-10-12 NOTE — Addendum Note (Signed)
 Addended by: Cephus Collin on: 10/12/2023 02:02 PM   Modules accepted: Orders

## 2023-10-13 ENCOUNTER — Telehealth: Payer: Self-pay | Admitting: *Deleted

## 2023-10-13 NOTE — Telephone Encounter (Signed)
No problems post procedure.

## 2023-10-23 ENCOUNTER — Other Ambulatory Visit: Payer: Self-pay | Admitting: Student in an Organized Health Care Education/Training Program

## 2023-10-23 DIAGNOSIS — G8929 Other chronic pain: Secondary | ICD-10-CM

## 2023-10-23 DIAGNOSIS — M1612 Unilateral primary osteoarthritis, left hip: Secondary | ICD-10-CM

## 2023-10-23 DIAGNOSIS — G894 Chronic pain syndrome: Secondary | ICD-10-CM

## 2023-10-23 DIAGNOSIS — M48061 Spinal stenosis, lumbar region without neurogenic claudication: Secondary | ICD-10-CM

## 2023-10-26 ENCOUNTER — Encounter: Payer: Self-pay | Admitting: Student in an Organized Health Care Education/Training Program

## 2023-10-27 DIAGNOSIS — E559 Vitamin D deficiency, unspecified: Secondary | ICD-10-CM | POA: Diagnosis not present

## 2023-10-27 DIAGNOSIS — D509 Iron deficiency anemia, unspecified: Secondary | ICD-10-CM | POA: Diagnosis not present

## 2023-10-27 DIAGNOSIS — I10 Essential (primary) hypertension: Secondary | ICD-10-CM | POA: Diagnosis not present

## 2023-10-27 DIAGNOSIS — R748 Abnormal levels of other serum enzymes: Secondary | ICD-10-CM | POA: Insufficient documentation

## 2023-10-27 DIAGNOSIS — N1831 Chronic kidney disease, stage 3a: Secondary | ICD-10-CM | POA: Diagnosis not present

## 2023-10-27 DIAGNOSIS — D631 Anemia in chronic kidney disease: Secondary | ICD-10-CM | POA: Diagnosis not present

## 2023-10-27 DIAGNOSIS — R809 Proteinuria, unspecified: Secondary | ICD-10-CM | POA: Diagnosis not present

## 2023-11-03 ENCOUNTER — Ambulatory Visit
Payer: Federal, State, Local not specified - PPO | Attending: Student in an Organized Health Care Education/Training Program | Admitting: Student in an Organized Health Care Education/Training Program

## 2023-11-03 ENCOUNTER — Encounter: Payer: Self-pay | Admitting: Student in an Organized Health Care Education/Training Program

## 2023-11-03 DIAGNOSIS — M1612 Unilateral primary osteoarthritis, left hip: Secondary | ICD-10-CM | POA: Diagnosis not present

## 2023-11-03 DIAGNOSIS — G8929 Other chronic pain: Secondary | ICD-10-CM | POA: Insufficient documentation

## 2023-11-03 DIAGNOSIS — M48061 Spinal stenosis, lumbar region without neurogenic claudication: Secondary | ICD-10-CM | POA: Diagnosis not present

## 2023-11-03 DIAGNOSIS — G894 Chronic pain syndrome: Secondary | ICD-10-CM

## 2023-11-03 DIAGNOSIS — M7918 Myalgia, other site: Secondary | ICD-10-CM | POA: Diagnosis not present

## 2023-11-03 MED ORDER — GABAPENTIN 600 MG PO TABS: 600.0000 mg | ORAL_TABLET | Freq: Every day | ORAL | 1 refills | Status: DC

## 2023-11-03 MED ORDER — OXYCODONE HCL 5 MG PO CAPS: 5.0000 mg | ORAL_CAPSULE | Freq: Three times a day (TID) | ORAL | 0 refills | Status: AC | PRN

## 2023-11-03 MED ORDER — TRAMADOL HCL ER 200 MG PO TB24: 200.0000 mg | ORAL_TABLET | Freq: Every day | ORAL | 2 refills | Status: DC

## 2023-11-03 NOTE — Progress Notes (Signed)
 Nursing Pain Medication Assessment:  Safety precautions to be maintained throughout the outpatient stay will include: orient to surroundings, keep bed in low position, maintain call bell within reach at all times, provide assistance with transfer out of bed and ambulation.  Medication Inspection Compliance: Ms. Boudreau did not comply with our request to bring her pills to be counted. She was reminded that bringing the medication bottles, even when empty, is a requirement.  Medication: None brought in. Pill/Patch Count: None available to be counted. Bottle Appearance: No container available. Did not bring bottle(s) to appointment. Filled Date: N/A Last Medication intake:   out of tramadol since middle of February and oxycodone she took today.

## 2023-11-03 NOTE — Progress Notes (Signed)
 PROVIDER NOTE: Information contained herein reflects review and annotations entered in association with encounter. Interpretation of such information and data should be left to medically-trained personnel. Information provided to patient can be located elsewhere in the medical record under "Patient Instructions". Document created using STT-dictation technology, any transcriptional errors that may result from process are unintentional.    Patient: Barbara Faulkner  Service Category: E/M  Provider: Edward Jolly, MD  DOB: [birth year]  DOS: 11/03/2023  Referring Provider: Allegra Grana, FNP  MRN: [id number]  Specialty: Interventional Pain Management  PCP: Allegra Grana, FNP  Type: Established Patient  Setting: Ambulatory outpatient    Location: Office  Delivery: Face-to-face     HPI  [name], a 59 y.o. year old female, is here today because of her No primary diagnosis found.. Ms. Daniel's primary complain today is Foot Pain (Right ), Knee Pain (Left ), and Hip Pain (Left )  Pertinent problems: Ms. 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 Pain Assessment: Severity of Chronic pain is reported as a 8 /10. Location: Knee Left/denies. Onset: More than a month ago. Quality: Sharp, Discomfort, Constant. Timing: Constant. Modifying factor(s): medications. Vitals:  vitals were not taken for this visit.  BMI: Estimated body mass index is 26.04 kg/m as calculated from the following:   Height as of 10/12/23: 5\' 3"  (1.6 m).   Weight as of 10/12/23: 147 lb (66.7 kg). Last encounter: 07/14/2023. Last procedure: 11/10/2022.  Reason for encounter:   History of Present Illness   Barbara Faulkner is a 58 year old female who presents for pain management and medication refills.  She experienced significant pain relief following a plantar fascia  injection, with at least 70% improvement. The relief is sustained unless she is on her feet for more than five hours, at which point she experiences some pain. When off her feet, she reports no pain.  She is out of tramadol and has been compensating by doubling her oxycodone dosage. She last filled her tramadol prescription on January 18th and expected a refill in February to last through March. She also requires a refill for oxycodone, which she typically uses for three months at a time. She has gabapentin with one more refill remaining and is okay with her current supply of Flexeril.  If she does not take her nighttime pain medications, she can stay awake for 24 hours due to her mental health conditions. She tries not to take these medications daily and sometimes forgets them when traveling.        Pharmacotherapy Assessment  Analgesic: tramadol 100 mg ER, continue oxycodone 5 mg daily for breakthrough pain.     Monitoring: [place] PMP: PDMP reviewed during this encounter.       Pharmacotherapy: No side-effects or adverse reactions reported. Compliance: No problems identified. Effectiveness: Clinically acceptable.  Vernie Ammons, RN  11/03/2023  2:39 PM  Sign when Signing Visit Nursing Pain Medication Assessment:  Safety precautions to be maintained throughout the outpatient stay will include: orient to surroundings, keep bed in low position, maintain call bell within reach at all times, provide assistance with transfer out of bed and ambulation.  Medication Inspection Compliance: Ms. Blanke did not comply with our request to bring her pills to be counted. She was reminded that bringing the medication bottles, even when empty, is a requirement.  Medication:  None brought in. Pill/Patch Count: None available to be counted. Bottle Appearance: No container available. Did not bring bottle(s) to appointment. Filled Date: N/A Last Medication intake:   out of tramadol since middle of February  and oxycodone she took today.   No results found for: "CBDTHCR" No results found for: "D8THCCBX" No results found for: "D9THCCBX"  UDS:  Summary  Date Value Ref Range Status  10/01/2022 Note  Final    Comment:    ==================================================================== [employer] (MW) ==================================================================== Test                             Result       Flag       Units  Drug Present and Declared for Prescription Verification   Oxycodone                      293          EXPECTED   ng/mg creat   Oxymorphone                    161          EXPECTED   ng/mg creat   Noroxycodone                   707          EXPECTED   ng/mg creat   Noroxymorphone                 144          EXPECTED   ng/mg creat    Sources of oxycodone are scheduled prescription medications.    Oxymorphone, noroxycodone, and noroxymorphone are expected    metabolites of oxycodone. Oxymorphone is also available as a    scheduled prescription medication.    Tramadol                       >3937        EXPECTED   ng/mg creat   O-Desmethyltramadol            >3937        EXPECTED   ng/mg creat   N-Desmethyltramadol            >3937        EXPECTED   ng/mg creat    Source of tramadol is a prescription medication. O-desmethyltramadol    and N-desmethyltramadol are expected metabolites of tramadol.  ==================================================================== Test                      Result    Flag   Units      Ref Range   Creatinine              127              mg/dL      >=16 ==================================================================== Declared Medications:  The flagging and interpretation on this report are based on the  following declared medications.  Unexpected results may arise from  inaccuracies in the declared medications.   **Note: The testing scope of this panel includes these medications:   Oxycodone (Percocet)  Tramadol  (Ultram)   **Note: The testing scope of this panel does not include the  following reported medications:   Acetaminophen (Percocet)  Calcitriol  Cholecalciferol  Cyclobenzaprine (Flexeril)  Duloxetine (Cymbalta)  Gabapentin (Neurontin)  Lamotrigine (Lamictal)  Pantoprazole (Protonix)  Quetiapine (Seroquel)  Vitamin D ==================================================================== For clinical consultation, please call [phone]. ====================================================================       ROS  Constitutional: Denies any fever or chills Gastrointestinal: No reported hemesis, hematochezia, vomiting, or acute GI distress Musculoskeletal:  Right plantar fascia pain, improved from before  Neurological: No reported episodes of acute onset apraxia, aphasia, dysarthria, agnosia, amnesia, paralysis, loss of coordination, or loss of consciousness  Medication Review  EPINEPHrine, QUEtiapine, calcitRIOL, cyclobenzaprine, gabapentin, lamoTRIgine, oxycodone, and traMADol  History Review  Allergy: Ms. Brickner is allergic to shellfish allergy and nsaids. Drug: Ms. Tadlock  reports no history of drug use. Alcohol:  reports that she does not currently use alcohol. Tobacco:  reports that she quit smoking about 20 years ago. Her smoking use included cigarettes. She started smoking about 40 years ago. She has a 40 pack-year smoking history. She has never used smokeless tobacco. Social: Ms. Wolters  reports that she quit smoking about 20 years ago. Her smoking use included cigarettes. She started smoking about 40 years ago. She has a 40 pack-year smoking history. She has never used smokeless tobacco. She reports that she does not currently use alcohol. She reports that she does not use drugs. Medical:  has a past medical history of Allergy, Anxiety, Chronic kidney disease, Chronic pain, Chronic sinusitis, COVID-19, Depression, Heart murmur, Hiatal hernia, HPV (human papilloma  virus) anogenital infection, Macromastia, Migraine, Psoriatic arthritis (HCC), Shingles, and UTI (urinary tract infection). Surgical: Ms. Sparr  has a past surgical history that includes Cholecystectomy (2009); Tubal ligation (1997); Laparoscopic gastric banding (2008); Gastric bypass; Breast biopsy (Right, 01/28/2018); Colonoscopy with propofol (N/A, 07/19/2018); Esophagogastroduodenoscopy (egd) with propofol (N/A, 07/19/2018); Breast reduction surgery (Bilateral, 04/03/2020); Colonoscopy with propofol (N/A, 09/17/2021); Colonoscopy with propofol (N/A, 09/18/2021); and Reduction mammaplasty. Family: family history includes HIV in her father; Hepatitis in her maternal uncle; Hyperlipidemia in her mother; Hypertension in her mother; Lung cancer in her maternal grandfather; Stroke in her mother.  Laboratory Chemistry Profile   Renal Lab Results  Component Value Date   BUN 18 06/11/2022   CREATININE 1.10 06/11/2022   BCR NOT APPLICABLE 07/14/2018   GFR 57.17 (L) 06/11/2022   GFRAA >60 02/15/2014   GFRNONAA 51 (L) 05/26/2022    Hepatic Lab Results  Component Value Date   AST 85 (H) 06/11/2022   ALT 90 (H) 06/11/2022   ALBUMIN 4.0 06/11/2022   ALKPHOS 150 (H) 06/11/2022   ALKPHOS 171 (H) 06/11/2022   AMYLASE 33 09/25/2014   LIPASE 124 10/04/2013    Electrolytes Lab Results  Component Value Date   NA 142 06/11/2022   K 4.3 06/11/2022   CL 110 06/11/2022   CALCIUM 8.7 06/11/2022   MG 1.9 07/14/2018   PHOS 5.0 (H) 09/25/2014    Bone Lab Results  Component Value Date   VD25OH 40.34 06/11/2022    Inflammation (CRP: Acute Phase) (ESR: Chronic Phase) Lab Results  Component Value Date   CRP <1.0 03/10/2019   ESRSEDRATE 3 03/10/2019         Note: Above Lab results reviewed.  Recent Imaging Review  [place] LIMITED JOINT SPACE STRUCTURES LOW RIGHT Procedure:  Injection of right plantar fascia origin under ultrasound  guidance. Ultrasound guidance utilized for in-plane approach to  plantar fascia  origin Samsung HS60 device utilized with permanent recording / reporting. Verbal informed consent obtained and verified. Skin prepped in a sterile fashion. Ethyl chloride for topical local analgesia.  Completed without difficulty and tolerated well. Medication: triamcinolone acetonide 40  mg/mL suspension for injection 1 mL  total and 2 mL lidocaine 1% without epinephrine utilized for needle  placement anesthetic Advised to contact for fevers/chills, erythema, induration, drainage, or  persistent bleeding. Note: Reviewed        Physical Exam  General appearance: Well nourished, well developed, and well hydrated. In no apparent acute distress Mental status: Alert, oriented x 3 (person, place, & time)       Respiratory: No evidence of acute respiratory distress Eyes: PERLA Vitals: LMP 04/02/2014  BMI: Estimated body mass index is 26.04 kg/m as calculated from the following:   Height as of 10/12/23: 5\' 3"  (1.6 m).   Weight as of 10/12/23: 147 lb (66.7 kg). Ideal: Patient weight not recorded  Left hip pain  Improvement in plantar fascia pain  Assessment   Diagnosis  1. Primary osteoarthritis of left hip   2. Chronic musculoskeletal pain   3. Chronic pain syndrome   4. Neural foraminal stenosis of lumbar spine      Updated Problems: No problems updated.   Plan of Care  Problem-specific:  Assessment and Plan    Chronic Pain   She reports significant pain relief following a recent plantar fascia injection, with approximately 70% improvement. Pain recurs after about five hours of work but is well-managed when off her feet. She has been doubling up on oxycodone due to running out of tramadol. She acknowledges the importance of addressing mental health aspects. She was informed about the option to repeat the injection every 3-4 months, with the next due in May or June. Plan to repeat the injection every 3-4 months, with the next due in May or June. Refill tramadol,  oxycodone, and gabapentin for three months.  Medication Management   She requires refills for tramadol, oxycodone, and gabapentin. She ran out of tramadol, leading to increased use of oxycodone. Gabapentin will likely run out before the next visit. Refill tramadol, oxycodone, and gabapentin for three months.  Mental Health Concerns   Mental health conditions affect her sleep, especially when she misses her night pain medications. The importance of addressing mental health in conjunction with chronic pain management was emphasized. Pain medications work centrally to help quiet mental health symptoms, and poor sleep impacts pain and mood. Ensure she continues to see a mental health professional.  Follow-up   Schedule a follow-up visit in three months.       Ms. HONEY ZAKARIAN has a current medication list which includes the following long-term medication(s): quetiapine and gabapentin.  Pharmacotherapy (Medications Ordered): Meds ordered this encounter  Medications   traMADol (ULTRAM-ER) 200 MG 24 hr tablet    Sig: Take 1 tablet (200 mg total) by mouth daily.    Dispense:  30 tablet    Refill:  2   oxycodone (OXY-IR) 5 MG capsule    Sig: Take 1 capsule (5 mg total) by mouth every 8 (eight) hours as needed for pain (breakthrough pain).    Dispense:  90 capsule    Refill:  0   gabapentin (NEURONTIN) 600 MG tablet    Sig: Take 1 tablet (600 mg total) by mouth at bedtime.    Dispense:  90 tablet    Refill:  1    Fill one day early if pharmacy is closed on scheduled refill date. May substitute for generic if available.   Orders:  No orders of the defined types were placed in this encounter.  Follow-up plan:   Return in about 3 months (around 02/03/2024)  for MM, F2F.      Recent Visits Date Type Provider Dept  10/12/23 Procedure visit Edward Jolly, MD [facility]  10/01/23 Office Visit Edward Jolly, MD [facility]  Showing recent visits within past 90 days and  meeting all other requirements Today's Visits Date Type Provider Dept  11/03/23 Office Visit Edward Jolly, MD [facility]  Showing today's visits and meeting all other requirements Future Appointments Date Type Provider Dept  01/26/24 Appointment Edward Jolly, MD [facility]  Showing future appointments within next 90 days and meeting all other requirements  I discussed the assessment and treatment plan with the patient. The patient was provided an opportunity to ask questions and all were answered. The patient agreed with the plan and demonstrated an understanding of the instructions.  Patient advised to call back or seek an in-person evaluation if the symptoms or condition worsens.  Duration of encounter: .  Total time on encounter, as per AMA guidelines included both the face-to-face and non-face-to-face time personally spent by the physician and/or other qualified health care professional(s) on the day of the encounter (includes time in activities that require the physician or other qualified health care professional and does not include time in activities normally performed by clinical staff). Physician's time may include the following activities when performed: Preparing to see the patient (e.g., pre-charting review of records, searching for previously ordered imaging, lab work, and nerve conduction tests) Review of prior analgesic pharmacotherapies. Reviewing PMP Interpreting ordered tests (e.g., lab work, imaging, nerve conduction tests) Performing post-procedure evaluations, including interpretation of diagnostic procedures Obtaining and/or reviewing separately obtained history Performing a medically appropriate examination and/or evaluation Counseling and educating the patient/family/caregiver Ordering medications, tests, or procedures Referring and communicating with other health care professionals (when not separately reported) Documenting clinical  information in the electronic or other health record Independently interpreting results (not separately reported) and communicating results to the patient/ family/caregiver Care coordination (not separately reported)  Note by: Edward Jolly, MD Date: 11/03/2023; Time: 3:13 PM

## 2023-11-10 ENCOUNTER — Inpatient Hospital Stay

## 2023-11-10 ENCOUNTER — Encounter: Payer: Self-pay | Admitting: Oncology

## 2023-11-10 ENCOUNTER — Inpatient Hospital Stay: Attending: Oncology | Admitting: Oncology

## 2023-11-10 VITALS — BP 124/82 | HR 92 | Temp 97.7°F | Resp 16 | Ht 63.0 in | Wt 159.0 lb

## 2023-11-10 DIAGNOSIS — G8929 Other chronic pain: Secondary | ICD-10-CM | POA: Diagnosis not present

## 2023-11-10 DIAGNOSIS — R5383 Other fatigue: Secondary | ICD-10-CM | POA: Insufficient documentation

## 2023-11-10 DIAGNOSIS — Z87440 Personal history of urinary (tract) infections: Secondary | ICD-10-CM | POA: Insufficient documentation

## 2023-11-10 DIAGNOSIS — Z87891 Personal history of nicotine dependence: Secondary | ICD-10-CM | POA: Insufficient documentation

## 2023-11-10 DIAGNOSIS — L4050 Arthropathic psoriasis, unspecified: Secondary | ICD-10-CM | POA: Insufficient documentation

## 2023-11-10 DIAGNOSIS — D509 Iron deficiency anemia, unspecified: Secondary | ICD-10-CM | POA: Insufficient documentation

## 2023-11-10 DIAGNOSIS — Z8616 Personal history of COVID-19: Secondary | ICD-10-CM | POA: Diagnosis not present

## 2023-11-10 DIAGNOSIS — N189 Chronic kidney disease, unspecified: Secondary | ICD-10-CM | POA: Diagnosis not present

## 2023-11-10 DIAGNOSIS — R011 Cardiac murmur, unspecified: Secondary | ICD-10-CM | POA: Insufficient documentation

## 2023-11-10 DIAGNOSIS — Z79899 Other long term (current) drug therapy: Secondary | ICD-10-CM | POA: Diagnosis not present

## 2023-11-10 NOTE — Progress Notes (Unsigned)
 [facility]  [phone] [phone]  ID: ALLIENE KLUGH OB: 12/17/1965  MR#: [id number]  AOZ#:[id number]  Patient Care Team: Allegra Grana, FNP as PCP - General (Family Medicine)  CHIEF COMPLAINT: Iron deficiency anemia.  INTERVAL HISTORY: Patient is a 58 year old female with a longstanding history of iron deficiency anemia who was last evaluated in clinic in 2020.  She is referred back for declining iron stores and consideration of IV Feraheme.  She has increased fatigue, but otherwise feels well.  She has no neurologic complaints.  She denies any recent fevers or illnesses.  She has a good appetite and denies weight loss.  She has no chest pain, shortness of breath, cough, or hemoptysis.  She denies any nausea, vomiting, constipation, or diarrhea.  She has no melena or hematochezia.  She has no urinary complaints.  Patient offers no further specific complaints today.  REVIEW OF SYSTEMS:   Review of Systems  Constitutional:  Positive for malaise/fatigue. Negative for fever and weight loss.  Respiratory: Negative.  Negative for cough, hemoptysis and shortness of breath.   Cardiovascular: Negative.  Negative for chest pain and leg swelling.  Gastrointestinal: Negative.  Negative for abdominal pain, blood in stool and melena.  Genitourinary: Negative.  Negative for hematuria.  Musculoskeletal:  Negative for back pain.  Skin: Negative.  Negative for rash.  Neurological: Negative.  Negative for dizziness, focal weakness, weakness and headaches.  Psychiatric/Behavioral: Negative.  The patient is not nervous/anxious.     As per HPI. Otherwise, a complete review of systems is negative.  PAST MEDICAL HISTORY: Past Medical History:  Diagnosis Date   Allergy    Anxiety    Chronic kidney disease    Chronic pain    Chronic sinusitis    COVID-19    09/14/20, 06/13/21   Depression    Heart murmur    Hiatal hernia    HPV (human papilloma virus)  anogenital infection    Macromastia    Migraine    Psoriatic arthritis (HCC)    Shingles    UTI (urinary tract infection)    ecoli 09/2021    PAST SURGICAL HISTORY: Past Surgical History:  Procedure Laterality Date   BREAST BIOPSY Right 01/28/2018   US guided biopsy - heart shaped   BREAST REDUCTION SURGERY Bilateral 04/03/2020   Procedure: MAMMARY REDUCTION  (BREAST);  Surgeon: Contogiannis, Chales Abrahams, MD;  Location: [facility];  Service: Plastics;  Laterality: Bilateral;  HAVE LIPOSUCTION MACHINE AVAILABLE   CHOLECYSTECTOMY  2009   COLONOSCOPY WITH PROPOFOL N/A 07/19/2018   Procedure: COLONOSCOPY WITH PROPOFOL;  Surgeon: Toney Reil, MD;  Location: [facility] ENDOSCOPY;  Service: Gastroenterology;  Laterality: N/A;   COLONOSCOPY WITH PROPOFOL N/A 09/17/2021   Procedure: COLONOSCOPY WITH PROPOFOL;  Surgeon: Toney Reil, MD;  Location: [facility] ENDOSCOPY;  Service: Gastroenterology;  Laterality: N/A;   COLONOSCOPY WITH PROPOFOL N/A 09/18/2021   Procedure: COLONOSCOPY WITH PROPOFOL;  Surgeon: Toney Reil, MD;  Location: [facility] ENDOSCOPY;  Service: Gastroenterology;  Laterality: N/A;   ESOPHAGOGASTRODUODENOSCOPY (EGD) WITH PROPOFOL N/A 07/19/2018   Procedure: ESOPHAGOGASTRODUODENOSCOPY (EGD) WITH PROPOFOL;  Surgeon: Toney Reil, MD;  Location: [facility] ENDOSCOPY;  Service: Gastroenterology;  Laterality: N/A;   GASTRIC BYPASS     2015; duodenal switch    LAPAROSCOPIC GASTRIC BANDING  2008   removed 2009   REDUCTION MAMMAPLASTY     TOTAL HIP ARTHROPLASTY Left 01/23/2023   TUBAL LIGATION  1997    FAMILY HISTORY: Family History  Problem Relation Age of Onset   Hypertension Mother    Hyperlipidemia Mother    Stroke Mother        age 88   HIV Father    Lung cancer Maternal Grandfather        smoker   Hepatitis Maternal Uncle        drug use   Colon cancer Neg Hx    Colon polyps Neg Hx    Rectal cancer Neg Hx    Stomach cancer Neg Hx    Breast  cancer Neg Hx     ADVANCED DIRECTIVES (Y/N):  N  HEALTH MAINTENANCE: Social History   Tobacco Use   Smoking status: Former    Current packs/day: 0.00    Average packs/day: 2.0 packs/day for 20.0 years (40.0 ttl pk-yrs)    Types: Cigarettes    Start date: 09/02/1983    Quit date: 09/02/2003    Years since quitting: 20.2   Smokeless tobacco: Never  Vaping Use   Vaping status: Never Used  Substance Use Topics   Alcohol use: Not Currently   Drug use: Never     Colonoscopy:  PAP:  Bone density:  Lipid panel:  Allergies  Allergen Reactions   Shellfish Allergy Swelling and Anaphylaxis    REACTION: throat closing   Nsaids     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     cyclobenzaprine (FLEXERIL) 5 MG tablet Take 1 tablet (5 mg total) by mouth at bedtime as needed for muscle spasms. 90 tablet 1   EPINEPHrine 0.3 mg/0.3 mL IJ SOAJ injection Inject into the muscle.     gabapentin (NEURONTIN) 600 MG tablet Take 1 tablet (600 mg total) by mouth at bedtime. 90 tablet 1   lamoTRIgine (LAMICTAL) 150 MG tablet Take 150 mg by mouth daily.     oxycodone (OXY-IR) 5 MG capsule Take 1 capsule (5 mg total) by mouth every 8 (eight) hours as needed for pain (breakthrough pain). 90 capsule 0   QUEtiapine (SEROQUEL) 100 MG tablet Take 100 mg by mouth at bedtime.     traMADol (ULTRAM-ER) 200 MG 24 hr tablet Take 1 tablet (200 mg total) by mouth daily. 30 tablet 2   Current Facility-Administered Medications  Medication Dose Route Frequency Provider Last Rate Last Admin   lidocaine (PF) (XYLOCAINE) 1 % injection 2 mL  2 mL Intradermal Once [name], MD        OBJECTIVE: Vitals:   11/10/23 1345  BP: 124/82  Pulse: 92  Resp: 16  Temp: 97.7 F (36.5 C)  SpO2: 99%     Body mass index is 28.17 kg/m.    ECOG FS:0 - Asymptomatic  General: Well-developed,  well-nourished, no acute distress. Eyes: Pink conjunctiva, anicteric sclera. HEENT: Normocephalic, moist mucous membranes. Lungs: No audible wheezing or coughing. Heart: Regular rate and rhythm. Abdomen: Soft, nontender, no obvious distention. Musculoskeletal: No edema, cyanosis, or clubbing. Neuro: Alert, answering all questions appropriately. Cranial nerves grossly intact. Skin: No rashes or petechiae noted. Psych: Normal affect. Lymphatics: No cervical, calvicular, axillary or inguinal LAD.   LAB RESULTS:  Lab Results  Component Value Date   NA 142 06/11/2022   K 4.3 06/11/2022   CL 110 06/11/2022   CO2 25 06/11/2022   GLUCOSE 96 06/11/2022   BUN 18 06/11/2022   CREATININE 1.10 06/11/2022   CALCIUM  8.7 06/11/2022   PROT 6.1 06/11/2022   ALBUMIN 4.0 06/11/2022   AST 85 (H) 06/11/2022   ALT 90 (H) 06/11/2022   ALKPHOS 150 (H) 06/11/2022   ALKPHOS 171 (H) 06/11/2022   BILITOT 0.9 06/11/2022   GFRNONAA 51 (L) 05/26/2022   GFRAA >60 02/15/2014    Lab Results  Component Value Date   WBC 9.6 06/11/2022   NEUTROABS 7.1 06/11/2022   HGB 10.9 (L) 06/11/2022   HCT 34.8 (L) 06/11/2022   MCV 86.1 06/11/2022   PLT 174.0 06/11/2022   Lab Results  Component Value Date   IRON 56 06/13/2022   TIBC 448.0 06/13/2022   IRONPCTSAT 12.5 (L) 06/13/2022   Lab Results  Component Value Date   FERRITIN 15.7 06/13/2022     STUDIES: No results found.  ASSESSMENT: Iron deficiency anemia.  PLAN:    Iron deficiency anemia: Likely secondary to poor absorption from history of gastric bypass.  Patient's last colonoscopy on September 18, 2021 did not reveal any significant pathology.  She last received 510 mg IV Feraheme on September 16, 2018.  Outside labs revealed decreased hemoglobin and iron stores.  Return to clinic later this week and the second time next week to receive treatment.  Patient will then return to clinic in 4 months with repeat laboratory, further evaluation, and  consideration of additional Feraheme if necessary.  I spent a total of 45 minutes reviewing chart data, face-to-face evaluation with the patient, counseling and coordination of care as detailed above.  Patient expressed understanding and was in agreement with this plan. She also understands that She can call clinic at any time with any questions, concerns, or complaints.    Jeralyn Ruths, MD   11/11/2023 1:15 PM

## 2023-11-11 ENCOUNTER — Encounter: Payer: Self-pay | Admitting: Oncology

## 2023-11-12 ENCOUNTER — Inpatient Hospital Stay

## 2023-11-16 DIAGNOSIS — F4312 Post-traumatic stress disorder, chronic: Secondary | ICD-10-CM | POA: Diagnosis not present

## 2023-11-16 DIAGNOSIS — F39 Unspecified mood [affective] disorder: Secondary | ICD-10-CM | POA: Diagnosis not present

## 2023-11-16 DIAGNOSIS — F419 Anxiety disorder, unspecified: Secondary | ICD-10-CM | POA: Diagnosis not present

## 2023-11-17 ENCOUNTER — Inpatient Hospital Stay

## 2023-11-19 ENCOUNTER — Inpatient Hospital Stay

## 2023-11-19 VITALS — BP 117/76 | HR 81 | Temp 97.3°F | Resp 17

## 2023-11-19 DIAGNOSIS — Z79899 Other long term (current) drug therapy: Secondary | ICD-10-CM | POA: Diagnosis not present

## 2023-11-19 DIAGNOSIS — R011 Cardiac murmur, unspecified: Secondary | ICD-10-CM | POA: Diagnosis not present

## 2023-11-19 DIAGNOSIS — G8929 Other chronic pain: Secondary | ICD-10-CM | POA: Diagnosis not present

## 2023-11-19 DIAGNOSIS — N189 Chronic kidney disease, unspecified: Secondary | ICD-10-CM | POA: Diagnosis not present

## 2023-11-19 DIAGNOSIS — Z87891 Personal history of nicotine dependence: Secondary | ICD-10-CM | POA: Diagnosis not present

## 2023-11-19 DIAGNOSIS — R5383 Other fatigue: Secondary | ICD-10-CM | POA: Diagnosis not present

## 2023-11-19 DIAGNOSIS — L4050 Arthropathic psoriasis, unspecified: Secondary | ICD-10-CM | POA: Diagnosis not present

## 2023-11-19 DIAGNOSIS — Z8616 Personal history of COVID-19: Secondary | ICD-10-CM | POA: Diagnosis not present

## 2023-11-19 DIAGNOSIS — D509 Iron deficiency anemia, unspecified: Secondary | ICD-10-CM | POA: Diagnosis not present

## 2023-11-19 DIAGNOSIS — D500 Iron deficiency anemia secondary to blood loss (chronic): Secondary | ICD-10-CM

## 2023-11-19 DIAGNOSIS — Z87440 Personal history of urinary (tract) infections: Secondary | ICD-10-CM | POA: Diagnosis not present

## 2023-11-19 MED ORDER — SODIUM CHLORIDE 0.9 % IV SOLN
510.0000 mg | Freq: Once | INTRAVENOUS | Status: AC
  Administered 2023-11-19: 510 mg via INTRAVENOUS
  Filled 2023-11-19: qty 510

## 2023-11-19 MED ORDER — SODIUM CHLORIDE 0.9 % IV SOLN
Freq: Once | INTRAVENOUS | Status: AC
  Filled 2023-11-19: qty 250

## 2023-11-20 DIAGNOSIS — F4312 Post-traumatic stress disorder, chronic: Secondary | ICD-10-CM | POA: Diagnosis not present

## 2023-11-20 DIAGNOSIS — F39 Unspecified mood [affective] disorder: Secondary | ICD-10-CM | POA: Diagnosis not present

## 2023-11-23 ENCOUNTER — Inpatient Hospital Stay

## 2023-11-23 VITALS — BP 112/78 | HR 96 | Resp 16

## 2023-11-23 DIAGNOSIS — L4050 Arthropathic psoriasis, unspecified: Secondary | ICD-10-CM | POA: Diagnosis not present

## 2023-11-23 DIAGNOSIS — Z79899 Other long term (current) drug therapy: Secondary | ICD-10-CM | POA: Diagnosis not present

## 2023-11-23 DIAGNOSIS — N189 Chronic kidney disease, unspecified: Secondary | ICD-10-CM | POA: Diagnosis not present

## 2023-11-23 DIAGNOSIS — R011 Cardiac murmur, unspecified: Secondary | ICD-10-CM | POA: Diagnosis not present

## 2023-11-23 DIAGNOSIS — Z87891 Personal history of nicotine dependence: Secondary | ICD-10-CM | POA: Diagnosis not present

## 2023-11-23 DIAGNOSIS — G8929 Other chronic pain: Secondary | ICD-10-CM | POA: Diagnosis not present

## 2023-11-23 DIAGNOSIS — Z8616 Personal history of COVID-19: Secondary | ICD-10-CM | POA: Diagnosis not present

## 2023-11-23 DIAGNOSIS — D500 Iron deficiency anemia secondary to blood loss (chronic): Secondary | ICD-10-CM

## 2023-11-23 DIAGNOSIS — R5383 Other fatigue: Secondary | ICD-10-CM | POA: Diagnosis not present

## 2023-11-23 DIAGNOSIS — Z87440 Personal history of urinary (tract) infections: Secondary | ICD-10-CM | POA: Diagnosis not present

## 2023-11-23 DIAGNOSIS — D509 Iron deficiency anemia, unspecified: Secondary | ICD-10-CM | POA: Diagnosis not present

## 2023-11-23 MED ORDER — SODIUM CHLORIDE 0.9 % IV SOLN
Freq: Once | INTRAVENOUS | Status: AC
  Filled 2023-11-23: qty 250

## 2023-11-23 MED ORDER — SODIUM CHLORIDE 0.9 % IV SOLN
510.0000 mg | Freq: Once | INTRAVENOUS | Status: AC
  Administered 2023-11-23: 510 mg via INTRAVENOUS
  Filled 2023-11-23: qty 510

## 2024-01-04 DIAGNOSIS — Z96642 Presence of left artificial hip joint: Secondary | ICD-10-CM | POA: Diagnosis not present

## 2024-01-26 ENCOUNTER — Encounter: Payer: Self-pay | Admitting: Nurse Practitioner

## 2024-01-26 ENCOUNTER — Encounter: Admitting: Student in an Organized Health Care Education/Training Program

## 2024-01-26 ENCOUNTER — Ambulatory Visit: Attending: Nurse Practitioner | Admitting: Nurse Practitioner

## 2024-01-26 VITALS — BP 126/91 | HR 86 | Temp 97.2°F | Resp 16 | Ht 63.0 in | Wt 151.0 lb

## 2024-01-26 DIAGNOSIS — M5416 Radiculopathy, lumbar region: Secondary | ICD-10-CM | POA: Insufficient documentation

## 2024-01-26 DIAGNOSIS — M545 Low back pain, unspecified: Secondary | ICD-10-CM | POA: Diagnosis not present

## 2024-01-26 DIAGNOSIS — M722 Plantar fascial fibromatosis: Secondary | ICD-10-CM | POA: Diagnosis not present

## 2024-01-26 DIAGNOSIS — M79605 Pain in left leg: Secondary | ICD-10-CM | POA: Insufficient documentation

## 2024-01-26 DIAGNOSIS — M1612 Unilateral primary osteoarthritis, left hip: Secondary | ICD-10-CM | POA: Insufficient documentation

## 2024-01-26 DIAGNOSIS — M48061 Spinal stenosis, lumbar region without neurogenic claudication: Secondary | ICD-10-CM | POA: Diagnosis not present

## 2024-01-26 DIAGNOSIS — M797 Fibromyalgia: Secondary | ICD-10-CM | POA: Insufficient documentation

## 2024-01-26 DIAGNOSIS — G8929 Other chronic pain: Secondary | ICD-10-CM | POA: Insufficient documentation

## 2024-01-26 DIAGNOSIS — M7918 Myalgia, other site: Secondary | ICD-10-CM | POA: Diagnosis not present

## 2024-01-26 DIAGNOSIS — G894 Chronic pain syndrome: Secondary | ICD-10-CM | POA: Insufficient documentation

## 2024-01-26 MED ORDER — TRAMADOL HCL ER 200 MG PO TB24: 200.0000 mg | ORAL_TABLET | Freq: Every day | ORAL | 2 refills | Status: AC

## 2024-01-26 NOTE — Progress Notes (Signed)
 Nursing Pain Medication Assessment:  Safety precautions to be maintained throughout the outpatient stay will include: orient to surroundings, keep bed in low position, maintain call bell within reach at all times, provide assistance with transfer out of bed and ambulation.  Medication Inspection Compliance: Pill count conducted under aseptic conditions, in front of the patient. Neither the pills nor the bottle was removed from the patient's sight at any time. Once count was completed pills were immediately returned to the patient in their original bottle.  Medication #1: Oxycodone /APAP Pill/Patch Count: 73 of 90 pills/patches remain Pill/Patch Appearance: Markings consistent with prescribed medication Bottle Appearance: Standard pharmacy container. Clearly labeled. Filled Date: 5 / 8 / 2025 Last Medication intake:  Today  2 additional smaller white pills pt states are from previous bottle of oxycodone /apap  Medication #2: Tramadol  (Ultram ) Pill/Patch Count: 6 of 30 pills/patches remain Pill/Patch Appearance: Markings consistent with prescribed medication Bottle Appearance: Standard pharmacy container. Clearly labeled. Filled Date: 01 / 18 / 2025 Last Medication intake:  Today

## 2024-01-26 NOTE — Progress Notes (Signed)
 PROVIDER NOTE: Interpretation of information contained herein should be left to medically-trained personnel. Specific patient instructions are provided elsewhere under "Patient Instructions" section of medical record. This document was created in part using AI and STT-dictation technology, any transcriptional errors that may result from this process are unintentional.  Patient: Barbara Faulkner  Service: E/M   PCP: Calista Catching, FNP  DOB: [birth year]  DOS: 01/26/2024  Provider: Cherylin Corrigan, NP  MRN: [id number]  Delivery: Face-to-face  Specialty: Interventional Pain Management  Type: Established Patient  Setting: Ambulatory outpatient facility  Specialty designation: 09  Referring Prov.: Calista Catching, FNP  Location: Outpatient office facility       History of present illness (HPI) [name], a 58 y.o. year old female, is here today because of her Plantar fasciitis of right foot [M72.2]. Ms. Needle primary complain today is Pain (Right heel, left knee, left hip)   Pain Assessment: Severity of Chronic pain is reported as a 9 /10. Location: Heel Right/denies. Onset: More than a month ago. Quality: Sharp. Timing: Constant. Modifying factor(s): meds. Vitals:  height is 5\' 3"  (1.6 m) and weight is 151 lb (68.5 kg). Her temperature is 97.2 F (36.2 C) (abnormal). Her blood pressure is 126/91 (abnormal) and her pulse is 86. Her respiration is 16 and oxygen saturation is 98%.  BMI: Estimated body mass index is 26.75 kg/m as calculated from the following:   Height as of this encounter: 5\' 3"  (1.6 m).   Weight as of this encounter: 151 lb (68.5 kg).  Last encounter: 11/03/2023 Last procedure: 10/12/2023  Reason for encounter: evaluation for possible interventional PM therapy/treatment and medication management. No change in medical history since last visit.  Patient's pain is at baseline.  Patient continues multimodal pain regimen as prescribed.  States that it provides pain relief and  improvement in functional status.  Of note, the patient received a right plantar fascia injection on October 12, 2023 for right foot pain resulting in approximately 70% pain relief and functional improvement.  The patient is currently experiencing a Flare up of right foot pain.  We discussed the option of repeating the injection and the patient agreed with the plan.  Pharmacotherapy Assessment  Analgesic: Tramadol  (Ultram -ER) 200 mg 24-hour tablet daily. MME=40 Monitoring: [place] PMP: PDMP reviewed during this encounter.       Pharmacotherapy: No side-effects or adverse reactions reported. Compliance: No problems identified. Effectiveness: Clinically acceptable.  Lennis Rabon, RN  01/26/2024 11:11 AM  Sign when Signing Visit Nursing Pain Medication Assessment:  Safety precautions to be maintained throughout the outpatient stay will include: orient to surroundings, keep bed in low position, maintain call bell within reach at all times, provide assistance with transfer out of bed and ambulation.  Medication Inspection Compliance: Pill count conducted under aseptic conditions, in front of the patient. Neither the pills nor the bottle was removed from the patient's sight at any time. Once count was completed pills were immediately returned to the patient in their original bottle.  Medication #1: Oxycodone /APAP Pill/Patch Count: 73 of 90 pills/patches remain Pill/Patch Appearance: Markings consistent with prescribed medication Bottle Appearance: Standard pharmacy container. Clearly labeled. Filled Date: 5 / 8 / 2025 Last Medication intake:  Today  2 additional smaller white pills pt states are from previous bottle of oxycodone /apap  Medication #2: Tramadol  (Ultram ) Pill/Patch Count: 6 of 30 pills/patches remain Pill/Patch Appearance: Markings consistent with prescribed medication Bottle Appearance: Standard pharmacy container. Clearly labeled. Filled Date: 61 / 73 /  2025 Last Medication  intake:  Today    No results found for: "CBDTHCR" No results found for: "D8THCCBX" No results found for: "D9THCCBX"  UDS:  Summary  Date Value Ref Range Status  10/01/2022 Note  Final    Comment:    ==================================================================== [facility] (MW) ==================================================================== Test                             Result       Flag       Units  Drug Present and Declared for Prescription Verification   Oxycodone                       293          EXPECTED   ng/mg creat   Oxymorphone                    161          EXPECTED   ng/mg creat   Noroxycodone                   707          EXPECTED   ng/mg creat   Noroxymorphone                 144          EXPECTED   ng/mg creat    Sources of oxycodone  are scheduled prescription medications.    Oxymorphone, noroxycodone, and noroxymorphone are expected    metabolites of oxycodone . Oxymorphone is also available as a    scheduled prescription medication.    Tramadol                        >3937        EXPECTED   ng/mg creat   O-Desmethyltramadol            >3937        EXPECTED   ng/mg creat   N-Desmethyltramadol            >3937        EXPECTED   ng/mg creat    Source of tramadol  is a prescription medication. O-desmethyltramadol    and N-desmethyltramadol are expected metabolites of tramadol .  ==================================================================== Test                      Result    Flag   Units      Ref Range   Creatinine              127              mg/dL      >=16 ==================================================================== Declared Medications:  The flagging and interpretation on this report are based on the  following declared medications.  Unexpected results may arise from  inaccuracies in the declared medications.   **Note: The testing scope of this panel includes these medications:   Oxycodone  (Percocet)  Tramadol   (Ultram )   **Note: The testing scope of this panel does not include the  following reported medications:   Acetaminophen  (Percocet)  Calcitriol  Cholecalciferol   Cyclobenzaprine  (Flexeril )  Duloxetine  (Cymbalta )  Gabapentin  (Neurontin )  Lamotrigine  (Lamictal )  Pantoprazole  (Protonix )  Quetiapine (Seroquel)  Vitamin D  ==================================================================== For clinical consultation, please call [phone]. ====================================================================      ROS  Constitutional: Denies any fever or chills Gastrointestinal: No reported  hemesis, hematochezia, vomiting, or acute GI distress Musculoskeletal: Left hip pain, left knee pain, and right heel pain Neurological: No reported episodes of acute onset apraxia, aphasia, dysarthria, agnosia, amnesia, paralysis, loss of coordination, or loss of consciousness  Medication Review  EPINEPHrine , QUEtiapine, calcitRIOL, cyclobenzaprine , gabapentin , lamoTRIgine , oxyCODONE -acetaminophen , and traMADol   History Review  Allergy: Ms. Zarzycki is allergic to shellfish allergy and nsaids. Drug: Ms. Gergely  reports no history of drug use. Alcohol:  reports that she does not currently use alcohol. Tobacco:  reports that she quit smoking about 20 years ago. Her smoking use included cigarettes. She started smoking about 40 years ago. She has a 40 pack-year smoking history. She has never used smokeless tobacco. Social: Ms. Kindig  reports that she quit smoking about 20 years ago. Her smoking use included cigarettes. She started smoking about 40 years ago. She has a 40 pack-year smoking history. She has never used smokeless tobacco. She reports that she does not currently use alcohol. She reports that she does not use drugs. Medical:  has a past medical history of Allergy, Anxiety, Chronic kidney disease, Chronic pain, Chronic sinusitis, COVID-19, Depression, Heart murmur, Hiatal hernia, HPV  (human papilloma virus) anogenital infection, Macromastia, Migraine, Psoriatic arthritis (HCC), Shingles, and UTI (urinary tract infection). Surgical: Ms. Kruser  has a past surgical history that includes Cholecystectomy (2009); Tubal ligation (1997); Laparoscopic gastric banding (2008); Gastric bypass; Breast biopsy (Right, 01/28/2018); Colonoscopy with propofol  (N/A, 07/19/2018); Esophagogastroduodenoscopy (egd) with propofol  (N/A, 07/19/2018); Breast reduction surgery (Bilateral, 04/03/2020); Colonoscopy with propofol  (N/A, 09/17/2021); Colonoscopy with propofol  (N/A, 09/18/2021); Reduction mammaplasty; and Total hip arthroplasty (Left, 01/23/2023). Family: family history includes HIV in her father; Hepatitis in her maternal uncle; Hyperlipidemia in her mother; Hypertension in her mother; Lung cancer in her maternal grandfather; Stroke in her mother.  Laboratory Chemistry Profile   Renal Lab Results  Component Value Date   BUN 18 06/11/2022   CREATININE 1.10 06/11/2022   BCR NOT APPLICABLE 07/14/2018   GFR 57.17 (L) 06/11/2022   GFRAA >60 02/15/2014   GFRNONAA 51 (L) 05/26/2022    Hepatic Lab Results  Component Value Date   AST 85 (H) 06/11/2022   ALT 90 (H) 06/11/2022   ALBUMIN 4.0 06/11/2022   ALKPHOS 150 (H) 06/11/2022   ALKPHOS 171 (H) 06/11/2022   AMYLASE 33 09/25/2014   LIPASE 124 10/04/2013    Electrolytes Lab Results  Component Value Date   NA 142 06/11/2022   K 4.3 06/11/2022   CL 110 06/11/2022   CALCIUM 8.7 06/11/2022   MG 1.9 07/14/2018   PHOS 5.0 (H) 09/25/2014    Bone Lab Results  Component Value Date   VD25OH 40.34 06/11/2022    Inflammation (CRP: Acute Phase) (ESR: Chronic Phase) Lab Results  Component Value Date   CRP <1.0 03/10/2019   ESRSEDRATE 3 03/10/2019         Note: Above Lab results reviewed.  Recent Imaging Review  US  LIMITED JOINT SPACE STRUCTURES LOW RIGHT Procedure:  Injection of right plantar fascia origin under ultrasound   guidance. Ultrasound guidance utilized for in-plane approach to plantar fascia  origin Samsung HS60 device utilized with permanent recording / reporting. Verbal informed consent obtained and verified. Skin prepped in a sterile fashion. Ethyl chloride for topical local analgesia.  Completed without difficulty and tolerated well. Medication: triamcinolone  acetonide 40 mg/mL suspension for injection 1 mL  total and 2 mL lidocaine  1% without epinephrine  utilized for needle  placement anesthetic Advised to contact for fevers/chills, erythema, induration,  drainage, or  persistent bleeding. Note: Reviewed         Physical Exam  General appearance: Well nourished, well developed, and well hydrated. In no apparent acute distress Mental status: Alert, oriented x 3 (person, place, & time)       Respiratory: No evidence of acute respiratory distress Eyes: PERLA Vitals: BP (!) 126/91   Pulse 86   Temp (!) 97.2 F (36.2 C)   Resp 16   Ht 5\' 3"  (1.6 m)   Wt 151 lb (68.5 kg)   LMP 04/02/2014   SpO2 98%   BMI 26.75 kg/m  BMI: Estimated body mass index is 26.75 kg/m as calculated from the following:   Height as of this encounter: 5\' 3"  (1.6 m).   Weight as of this encounter: 151 lb (68.5 kg). Ideal: Ideal body weight: 52.4 kg (115 lb 8.3 oz) Adjusted ideal body weight: 58.8 kg (129 lb 11.4 oz)  Assessment   Diagnosis Status  1. Plantar fasciitis of right foot   2. Primary osteoarthritis of left hip   3. Chronic musculoskeletal pain   4. Chronic pain syndrome   5. Neural foraminal stenosis of lumbar spine   6. Myofascial pain syndrome   7. Fibromyalgia   8. Lumbar pain with radiation down left leg   9. Lumbar radicular pain    Having a Flare-up Controlled Controlled   Updated Problems: No problems updated.  Plan of Care  Problem-specific:  Assessment and Plan We will continue on current medication regimen.  Prescribing drug monitoring (PDMP) reviewed; findings consistent  with use of prescribed medication and no evidence of narcotic misuse or abuse.  Urine drug screening (UDS) up-to-date.   Given the flareup of right heel pain, we discussed the option of repeating the right plantar fascia injection with Dr. Rhesa Celeste.   Ms. KELANI ROBART has a current medication list which includes the following long-term medication(s): gabapentin  and quetiapine.  Pharmacotherapy (Medications Ordered): Meds ordered this encounter  Medications   traMADol  (ULTRAM -ER) 200 MG 24 hr tablet    Sig: Take 1 tablet (200 mg total) by mouth daily.    Dispense:  30 tablet    Refill:  2   Orders:  Orders Placed This Encounter  Procedures   Injection tendon or ligament    Plantar Fasciitis of right foot    Standing Status:   Future    Expiration Date:   04/27/2024    Scheduling Instructions:     Type of Block:  Lower Extremity injection     Side: Right-sided     Sedation: No Sedation.     Timeframe: ASAA        Return in about 3 months (around 04/27/2024) for (F2F), (MM), Marthe Slain NP.    Recent Visits Date Type Provider Dept  11/03/23 Office Visit Cephus Collin, MD [facility]  Showing recent visits within past 90 days and meeting all other requirements Today's Visits Date Type Provider Dept  01/26/24 Office Visit Jeannetta Cerutti K, NP [facility]  Showing today's visits and meeting all other requirements Future Appointments No visits were found meeting these conditions. Showing future appointments within next 90 days and meeting all other requirements  I discussed the assessment and treatment plan with the patient. The patient was provided an opportunity to ask questions and all were answered. The patient agreed with the plan and demonstrated an understanding of the instructions.  Patient advised to call back or seek an in-person evaluation if the  symptoms or condition worsens.  Duration of encounter: 30 minutes.  Total time on encounter, as per AMA  guidelines included both the face-to-face and non-face-to-face time personally spent by the physician and/or other qualified health care professional(s) on the day of the encounter (includes time in activities that require the physician or other qualified health care professional and does not include time in activities normally performed by clinical staff). Physician's time may include the following activities when performed: Preparing to see the patient (e.g., pre-charting review of records, searching for previously ordered imaging, lab work, and nerve conduction tests) Review of prior analgesic pharmacotherapies. Reviewing PMP Interpreting ordered tests (e.g., lab work, imaging, nerve conduction tests) Performing post-procedure evaluations, including interpretation of diagnostic procedures Obtaining and/or reviewing separately obtained history Performing a medically appropriate examination and/or evaluation Counseling and educating the patient/family/caregiver Ordering medications, tests, or procedures Referring and communicating with other health care professionals (when not separately reported) Documenting clinical information in the electronic or other health record Independently interpreting results (not separately reported) and communicating results to the patient/ family/caregiver Care coordination (not separately reported)  Note by: Carolos Fecher K Haru Anspaugh, NP (TTS and AI technology used. I apologize for any typographical errors that were not detected and corrected.) Date: 01/26/2024; Time: 12:33 PM

## 2024-02-16 DIAGNOSIS — F39 Unspecified mood [affective] disorder: Secondary | ICD-10-CM | POA: Diagnosis not present

## 2024-02-16 DIAGNOSIS — F419 Anxiety disorder, unspecified: Secondary | ICD-10-CM | POA: Diagnosis not present

## 2024-02-16 DIAGNOSIS — F4312 Post-traumatic stress disorder, chronic: Secondary | ICD-10-CM | POA: Diagnosis not present

## 2024-02-17 ENCOUNTER — Ambulatory Visit: Admitting: Student in an Organized Health Care Education/Training Program

## 2024-02-24 ENCOUNTER — Ambulatory Visit: Admitting: Podiatry

## 2024-02-29 ENCOUNTER — Ambulatory Visit
Admission: RE | Admit: 2024-02-29 | Discharge: 2024-02-29 | Disposition: A | Discharge status: Home / Self Care | Source: Ambulatory Visit | Attending: Student in an Organized Health Care Education/Training Program | Admitting: Student in an Organized Health Care Education/Training Program

## 2024-02-29 ENCOUNTER — Telehealth: Payer: Self-pay

## 2024-02-29 ENCOUNTER — Ambulatory Visit: Admitting: Student in an Organized Health Care Education/Training Program

## 2024-02-29 ENCOUNTER — Encounter: Payer: Self-pay | Admitting: Student in an Organized Health Care Education/Training Program

## 2024-02-29 VITALS — BP 120/80 | HR 95 | Temp 98.3°F | Resp 18 | Ht 63.0 in | Wt 156.0 lb

## 2024-02-29 DIAGNOSIS — M1712 Unilateral primary osteoarthritis, left knee: Secondary | ICD-10-CM | POA: Insufficient documentation

## 2024-02-29 DIAGNOSIS — M25562 Pain in left knee: Secondary | ICD-10-CM | POA: Diagnosis not present

## 2024-02-29 DIAGNOSIS — M722 Plantar fascial fibromatosis: Secondary | ICD-10-CM

## 2024-02-29 MED ORDER — LIDOCAINE HCL 2 % IJ SOLN
20.0000 mL | Freq: Once | INTRAMUSCULAR | Status: AC
  Administered 2024-02-29: 400 mg
  Filled 2024-02-29: qty 20

## 2024-02-29 MED ORDER — ROPIVACAINE HCL 2 MG/ML IJ SOLN
4.0000 mL | Freq: Once | INTRAMUSCULAR | Status: AC
  Administered 2024-02-29: 20 mL via INTRA_ARTICULAR
  Filled 2024-02-29: qty 20

## 2024-02-29 MED ORDER — DEXAMETHASONE SODIUM PHOSPHATE 10 MG/ML IJ SOLN
10.0000 mg | Freq: Once | INTRAMUSCULAR | Status: AC
  Administered 2024-02-29: 10 mg
  Filled 2024-02-29: qty 1

## 2024-02-29 NOTE — Telephone Encounter (Signed)
 Copied from CRM [id number]. Topic: Clinical - Request for Lab/Test Order >> Feb 29, 2024 11:06 AM Laymon HERO wrote: Reason for CRM: patient wanting to get an order put in for labs before her appt on 7/18 for her physical

## 2024-02-29 NOTE — Patient Instructions (Signed)

## 2024-02-29 NOTE — Progress Notes (Signed)
 PROVIDER NOTE: Interpretation of information contained herein should be left to medically-trained personnel. Specific patient instructions are provided elsewhere under Patient Instructions section of medical record. This document was created in part using STT-dictation technology, any transcriptional errors that may result from this process are unintentional.  Patient: Barbara Faulkner Type: Established DOB: 1965-11-25 MRN: [id number] PCP: Dineen [name] MATSU, FNP  Service: Procedure DOS: 02/29/2024 Setting: Ambulatory Location: Ambulatory outpatient facility Delivery: Face-to-face Provider: Wallie Sherry, MD Specialty: Interventional Pain Management Specialty designation: 09 Location: Outpatient facility Ref. Prov.: Dineen [name] MATSU, FNP       Interventional Therapy     Procedure:          Anesthesia, Analgesia, Anxiolysis:  Right plantar fascia injection for right plantar fasciitis  Type: Local Anesthesia Local Anesthetic: Lidocaine  1-2% Sedation: None  Indication(s):  Analgesia Route: Infiltration ([place]/IM) IV Access: N/A   Position: Supine   1. Plantar fasciitis of right foot   2. Primary osteoarthritis of left knee     NAS-11 Pain score:   Pre-procedure: 10-Worst pain ever/10   Post-procedure: 10-Worst pain ever/10     H&P (Pre-op Assessment):  Barbara Faulkner is a 58 y.o. (year old), female patient, seen today for interventional treatment. 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 Barbara Faulkner has a current medication list which  includes the following prescription(s): calcitriol, cyclobenzaprine , epinephrine , gabapentin , lamotrigine , oxycodone -acetaminophen , quetiapine, and tramadol , and the following Facility-Administered Medications: lidocaine  (pf). Her primarily concern today is the Foot Pain (Heel )  Initial Vital Signs:  Pulse/HCG Rate: 95  Temp: 98.3 F (36.8 C) Resp: 18 BP: 120/80 SpO2: 100 %  BMI: Estimated body mass index is 27.63 kg/m as calculated from the following:   Height as of this encounter: 5' 3 (1.6 m).   Weight as of this encounter: 156 lb (70.8 kg).  Risk Assessment: Allergies: Reviewed. She is allergic to shellfish allergy and nsaids.  Allergy Precautions: None required Coagulopathies: Reviewed. None identified.  Blood-thinner therapy: None at this time Active Infection(s): Reviewed. None identified. Barbara Faulkner is afebrile  Site Confirmation: Barbara Faulkner was asked to confirm the procedure and laterality before marking the site Procedure checklist: Completed Consent: Before the procedure and under the influence of no sedative(s), amnesic(s), or anxiolytics, the patient was informed of the treatment options, risks and possible complications. To fulfill our ethical and legal obligations, as recommended by the American Medical Association's Code of Ethics, I have informed the patient of my clinical impression; the nature and purpose of the treatment or procedure; the risks, benefits, and possible complications of the intervention; the alternatives, including doing nothing; the risk(s) and benefit(s) of the alternative treatment(s) or procedure(s); and the risk(s) and benefit(s) of doing nothing. The patient was provided information about the general risks and possible complications associated with the procedure. These may include, but are not limited to: failure to achieve desired goals, infection, bleeding, organ or nerve damage, allergic reactions, paralysis, and death. In addition, the  patient was informed of those risks and complications associated to the procedure, such as failure to decrease pain; infection; bleeding; organ or nerve damage with subsequent damage to sensory, motor, and/or autonomic systems, resulting in permanent pain, numbness, and/or weakness  of one or several areas of the body; allergic reactions; (i.e.: anaphylactic reaction); and/or death. Furthermore, the patient was informed of those risks and complications associated with the medications. These include, but are not limited to: allergic reactions (i.e.: anaphylactic or anaphylactoid reaction(s)); adrenal axis suppression; blood sugar elevation that in diabetics may result in ketoacidosis or comma; water  retention that in patients with history of congestive heart failure may result in shortness of breath, pulmonary edema, and decompensation with resultant heart failure; weight gain; swelling or edema; medication-induced neural toxicity; particulate matter embolism and blood vessel occlusion with resultant organ, and/or nervous system infarction; and/or aseptic necrosis of one or more joints. Finally, the patient was informed that Medicine is not an exact science; therefore, there is also the possibility of unforeseen or unpredictable risks and/or possible complications that may result in a catastrophic outcome. The patient indicated having understood very clearly. We have given the patient no guarantees and we have made no promises. Enough time was given to the patient to ask questions, all of which were answered to the patient's satisfaction. Barbara Faulkner has indicated that she wanted to continue with the procedure. Attestation: I, the ordering provider, attest that I have discussed with the patient the benefits, risks, side-effects, alternatives, likelihood of achieving goals, and potential problems during recovery for the procedure that I have provided informed consent. Date  Time: 02/29/2024  2:00  PM  Pre-Procedure Preparation:  Monitoring: As per clinic protocol. Respiration, ETCO2, SpO2, BP, heart rate and rhythm monitor placed and checked for adequate function Safety Precautions: Patient was assessed for positional comfort and pressure points before starting the procedure. Time-out: I initiated and conducted the Time-out before starting the procedure, as per protocol. The patient was asked to participate by confirming the accuracy of the Time Out information. Verification of the correct person, site, and procedure were performed and confirmed by me, the nursing staff, and the patient. Time-out conducted as per Joint Commission's Universal Protocol (UP.01.01.01). Time: 1447 Start Time: 1448 hrs.  Description of Procedure:           The patient was placed in a supine position with the right foot externally rotated for optimal access to the plantar fascia. The foot was prepped with chlorhexidine  in a sterile manner. A skin wheal was raised using 1% lidocaine  with a 27-gauge needle for local anesthesia.  A 27-gauge needle was inserted medial to the plantar fascia insertion at the calcaneus under palpation guidance. After negative aspiration, a combination of 1 mL of Decadron  10mg /cc and 5mL of 0.2% Ropivacaine   was injected into the plantar fascia. The needle was withdrawn, and hemostasis was achieved with direct pressure.   Antibiotic Prophylaxis:   Anti-infectives (From admission, onward)    None      Indication(s): None identified  Post-operative Assessment:  Post-procedure Vital Signs:  Pulse/HCG Rate: 95  Temp: 98.3 F (36.8 C) Resp: 18 BP: 120/80 SpO2: 100 %  EBL: None  Complications: No immediate post-treatment complications observed by team, or reported by patient.  Note: The patient tolerated the entire procedure well. A repeat set of vitals were taken after the procedure and the patient was kept under observation following institutional policy, for this  type of procedure. Post-procedural neurological assessment was performed, showing return to baseline, prior to discharge. The patient was provided with post-procedure discharge instructions, including a section on how to identify potential problems. Should any problems arise concerning this procedure, the patient was given instructions to immediately contact us , at any  time, without hesitation. In any case, we plan to contact the patient by telephone for a follow-up status report regarding this interventional procedure.  Comments:  No additional relevant information.  Plan of Care (POC)  Orders:  Orders Placed This Encounter  Procedures   DG Knee Complete 4 Views Left    Standing Status:   Future    Expiration Date:   05/31/2024    Scheduling Instructions:     Please make sure that the patient understands that this needs to be done as soon as possible. Never have the patient do the imaging just before the next appointment. Inform patient that having the imaging done within the [facility] will expedite the availability of the results and will provide      imaging availability to the requesting physician. In addition inform the patient that the imaging order has an expiration date and will not be renewed if not done within the active period.    Reason for Exam (SYMPTOM  OR DIAGNOSIS REQUIRED):   Left knee pain/arthralgia    Is patient pregnant?:   No    Preferred imaging location?:   [facility]    Release to patient:   Immediate    Call Results- Best Contact Number?:   [phone] [facility]   Chronic Opioid Analgesic:  tramadol  100 mg ER, continue oxycodone  5 mg daily for breakthrough pain.     Medications ordered for procedure: Meds ordered this encounter  Medications   lidocaine  (XYLOCAINE ) 2 % (with pres) injection 400 mg   ropivacaine  (PF) 2 mg/mL (0.2%) (NAROPIN ) injection 4 mL   dexamethasone  (DECADRON ) injection 10 mg    Medications administered: We administered lidocaine , ropivacaine  (PF) 2 mg/mL (0.2%), and dexamethasone .  See the medical record for exact dosing, route, and time of administration.  Follow-up plan:   No follow-ups on file.      Recent Visits Date Type Provider Dept  01/26/24 Office Visit Patel, Seema K, NP [facility]  Showing recent visits within past 90 days and meeting all other requirements Today's Visits Date Type Provider Dept  02/29/24 Procedure visit Marcelino Nurse, MD [facility]  Showing today's visits and meeting all other requirements Future Appointments Date Type Provider Dept  04/27/24 Appointment Patel, Seema K, NP [facility]  Showing future appointments within next 90 days and meeting all other requirements  Disposition: Discharge home  Discharge (Date  Time): 02/29/2024; 1450 hrs.   Primary Care Physician: Dineen [name] MATSU, FNP Location: [facility] Note by: Nurse Marcelino, MD (TTS technology used. I apologize for any typographical errors that were not detected and corrected.) Date: 02/29/2024; Time: 3:15 PM  Disclaimer:  Medicine is not an Visual merchandiser. The only guarantee in medicine is that nothing is guaranteed. It is important to note that the decision to proceed with this intervention was based on the information collected from the patient. The Data and conclusions were drawn from the patient's questionnaire, the interview, and the physical examination. Because the information was provided in large part by the patient, it cannot be guaranteed that it has not been purposely or unconsciously manipulated. Every effort has been made to obtain as much relevant data as possible for this evaluation. It is important to note that the conclusions that lead to this procedure are derived in large part from the available data. Always take into account that the treatment will also be dependent on availability of  resources and existing treatment guidelines, considered by other Pain Management Practitioners as being common knowledge and practice, at the time of the intervention. For Medico-Legal purposes, it is also important to point out that variation in procedural techniques and pharmacological choices are the acceptable norm. The indications, contraindications, technique, and results of the above procedure should only be interpreted and judged by a Board-Certified Interventional Pain Specialist with extensive familiarity and expertise in the same exact procedure and technique.

## 2024-03-01 ENCOUNTER — Ambulatory Visit: Payer: Self-pay | Admitting: Student in an Organized Health Care Education/Training Program

## 2024-03-01 ENCOUNTER — Telehealth: Payer: Self-pay | Admitting: *Deleted

## 2024-03-01 DIAGNOSIS — M1712 Unilateral primary osteoarthritis, left knee: Secondary | ICD-10-CM

## 2024-03-01 NOTE — Telephone Encounter (Signed)
 Spoke to pt she would like to get labs done the day of her appt also would like liver panel checked as well

## 2024-03-01 NOTE — Telephone Encounter (Signed)
 No problems post procedure.

## 2024-03-01 NOTE — Telephone Encounter (Signed)
 Patient informed of x-ray results.  She would like to proceed with MRI. Will you please order?

## 2024-03-03 ENCOUNTER — Other Ambulatory Visit: Payer: Self-pay | Admitting: Student in an Organized Health Care Education/Training Program

## 2024-03-03 DIAGNOSIS — M7918 Myalgia, other site: Secondary | ICD-10-CM

## 2024-03-03 DIAGNOSIS — M48061 Spinal stenosis, lumbar region without neurogenic claudication: Secondary | ICD-10-CM

## 2024-03-03 DIAGNOSIS — G894 Chronic pain syndrome: Secondary | ICD-10-CM

## 2024-03-03 DIAGNOSIS — M1612 Unilateral primary osteoarthritis, left hip: Secondary | ICD-10-CM

## 2024-03-07 ENCOUNTER — Ambulatory Visit: Admission: RE | Admit: 2024-03-07 | Source: Ambulatory Visit

## 2024-03-07 ENCOUNTER — Encounter: Payer: Self-pay | Admitting: Podiatry

## 2024-03-07 ENCOUNTER — Ambulatory Visit: Admitting: Podiatry

## 2024-03-07 VITALS — Ht 63.0 in | Wt 156.0 lb

## 2024-03-07 DIAGNOSIS — M79671 Pain in right foot: Secondary | ICD-10-CM

## 2024-03-07 DIAGNOSIS — L84 Corns and callosities: Secondary | ICD-10-CM

## 2024-03-07 DIAGNOSIS — M2041 Other hammer toe(s) (acquired), right foot: Secondary | ICD-10-CM | POA: Diagnosis not present

## 2024-03-07 NOTE — Patient Instructions (Signed)
  VISIT SUMMARY: You came in today because of a painful callus between your fourth and fifth toes on your right foot, which has been bothering you for about two weeks. You also have chronic kidney disease, which is currently well-managed and does not require dialysis.  YOUR PLAN: -CALLUS BETWEEN RIGHT FOURTH AND FIFTH TOES: A callus is a thickened and hardened part of the skin that forms due to repeated friction or pressure. In your case, the callus is forming between your toes because of their position, causing them to rub together. To address this, we trimmed the callus to remove the dead skin and provided you with a toe spacer to reduce pressure between the toes. You should consider purchasing additional toe spacers or pads online. We also recommend looking into shoes with a wider toe box, such as [employer] or Altra, to prevent your toes from being squeezed. If needed, we can write a note for your employer to allow specific shoe types for medical reasons. If the callus becomes a recurrent and severe issue, surgical intervention may be considered, which involves removing part of the knuckle and partially webbing the toes. Recovery from surgery would take about three weeks for initial healing, with additional time for swelling to subside before you can return to wearing closed shoes.  -CHRONIC KIDNEY DISEASE: Chronic kidney disease means your kidneys are not working as well as they should over a long period. Your condition is currently well-managed and does not require dialysis.  INSTRUCTIONS: Please follow up with us  if the callus does not improve or if you experience any new symptoms. Consider purchasing additional toe spacers or pads online and look into shoes with a wider toe box to prevent further issues. If you need a note for your employer regarding specific shoe types, let us  know.                      Contains text generated by [employer]bridge.                                  Contains text generated by [employer].

## 2024-03-07 NOTE — Progress Notes (Signed)
 Subjective:  Patient ID: Barbara Faulkner, female    DOB: [birth year],  MRN: [id number]  Chief Complaint  Patient presents with   Callouses    Rm 7 Patient is here for callus between the fourth and fifth toe. Patient states callus has been presence for last two weeks with throbbing pain. Patient has not treated callus at home.    Discussed the use of AI scribe software for clinical note transcription with the patient, who gave verbal consent to proceed.  History of Present Illness The patient is a 58 year old with chronic kidney disease who presents with a painful callus between her toes.  She has a painful callus located between the fourth and fifth toes on her right foot, present for about two weeks. The pain is described as 'terrible' and affects her ability to wear certain types of shoes, such as high heels, which she enjoys for events like concerts.  The callus has recurred intermittently over the years, with the last occurrence being three years ago. It sometimes resolves on its own but can persist, as in the current episode. She has not used any padding or other interventions between the toes recently.  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 She wears closed shoes at work and keeps them loose to minimize discomfort. There is no specific brand requirement for her work shoes, but they must be 'leatherish'.  She has chronic kidney disease, which does not require dialysis. No similar issues are present on the left foot.      Objective:    Physical Exam VASCULAR: DP and PT pulse palpable. Foot is warm and well-perfused. Capillary fill time is brisk. DERMATOLOGIC: Normal skin turgor, texture, and temperature. No open lesions, rashes, or ulcerations. NEUROLOGIC: Normal sensation to light touch and pressure. No paresthesias. ORTHOPEDIC: Smooth pain-free range of motion of all examined joints. No ecchymosis or  bruising. Right fourth and fifth toes contracted in adducto varus position with heloma molle soft corn at base.   No images are attached to the encounter.    Results Procedure: Callus Debridement Description: Debrided hyperkeratotic tissue between the fourth and fifth toes on the right foot.   Assessment:   1. Callus of foot   2. Hammertoe of right foot   3. Pain in right foot      Plan:  Patient was evaluated and treated and all questions answered.  Assessment and Plan Assessment & Plan Callus between right fourth and fifth toes Intermittent callus formation between the right fourth and fifth toes, currently painful and present for two weeks. The callus is due to the adducto varus position of the toes causing the knuckles to rub together, leading to a soft corn formation. The condition is recurrent but has not occurred in the past three years. She experiences discomfort, especially when wearing closed shoes at work, and prefers high heels, which exacerbate the issue. - Debrided the callus to remove dead skin. - Provide a toe spacer to reduce pressure between the toes. - Recommend purchasing additional toe spacers or pads from online retailers like [employer]. - Advise on shoe options with a wider toe box, such as [employer] SR or Altra, to prevent squeezing of the toes. - Write a note for her employer if specific shoe types are required for medical reasons. - Consider surgical intervention if the condition becomes recurrent and severe, involving removal of part of the knuckle and partial  webbing of the fourth and fifth toes. Recovery involves initial healing of the skin in about three weeks, with additional time for swelling to subside before returning to closed shoes.  Chronic kidney disease Chronic kidney disease, well-managed and not requiring dialysis.      No follow-ups on file.

## 2024-03-09 ENCOUNTER — Telehealth: Payer: Self-pay | Admitting: Nurse Practitioner

## 2024-03-09 ENCOUNTER — Telehealth: Payer: Self-pay | Admitting: Student in an Organized Health Care Education/Training Program

## 2024-03-09 ENCOUNTER — Other Ambulatory Visit: Payer: Self-pay | Admitting: *Deleted

## 2024-03-09 DIAGNOSIS — M1612 Unilateral primary osteoarthritis, left hip: Secondary | ICD-10-CM

## 2024-03-09 DIAGNOSIS — G894 Chronic pain syndrome: Secondary | ICD-10-CM

## 2024-03-09 DIAGNOSIS — M48061 Spinal stenosis, lumbar region without neurogenic claudication: Secondary | ICD-10-CM

## 2024-03-09 DIAGNOSIS — M7918 Myalgia, other site: Secondary | ICD-10-CM

## 2024-03-09 MED ORDER — CYCLOBENZAPRINE HCL 5 MG PO TABS: 5.0000 mg | ORAL_TABLET | Freq: Every evening | ORAL | 1 refills | Status: DC | PRN

## 2024-03-09 NOTE — Telephone Encounter (Signed)
 PT called stated that pharmacy has send over a request for cyclobenzaprine . PT stated that when she check on yesterday she wasn't able to get refill. Please give pharmacy a call. TY

## 2024-03-09 NOTE — Telephone Encounter (Signed)
 error

## 2024-03-09 NOTE — Telephone Encounter (Signed)
 Rx request sent to Seema.

## 2024-03-14 ENCOUNTER — Other Ambulatory Visit

## 2024-03-15 ENCOUNTER — Ambulatory Visit

## 2024-03-15 ENCOUNTER — Ambulatory Visit: Admitting: Oncology

## 2024-03-16 ENCOUNTER — Telehealth: Payer: Self-pay

## 2024-03-16 NOTE — Telephone Encounter (Signed)
 Copied from CRM [id number]. Topic: Clinical - Request for Lab/Test Order >> Mar 16, 2024  2:43 PM Chasity T wrote: Reason for CRM: Patient is calling in to get lab appointment scheduled but no labs were ordered. She stated that Dr Dineen mentioned that orders would be placed and she wanted to come in tomorrow to get them done. Please contact patient regarding labs.   She also wants to know if a paps smear can be done at the appointment as well

## 2024-03-18 ENCOUNTER — Other Ambulatory Visit (HOSPITAL_COMMUNITY)
Admission: RE | Admit: 2024-03-18 | Discharge: 2024-03-18 | Disposition: A | Discharge status: Home / Self Care | Source: Ambulatory Visit | Attending: Family | Admitting: Family

## 2024-03-18 ENCOUNTER — Encounter: Payer: Self-pay | Admitting: Family

## 2024-03-18 ENCOUNTER — Ambulatory Visit: Admitting: Family

## 2024-03-18 VITALS — BP 110/78 | HR 94 | Temp 97.9°F | Resp 20 | Ht 63.5 in | Wt 156.0 lb

## 2024-03-18 DIAGNOSIS — D631 Anemia in chronic kidney disease: Secondary | ICD-10-CM | POA: Diagnosis not present

## 2024-03-18 DIAGNOSIS — Z780 Asymptomatic menopausal state: Secondary | ICD-10-CM | POA: Insufficient documentation

## 2024-03-18 DIAGNOSIS — Z1151 Encounter for screening for human papillomavirus (HPV): Secondary | ICD-10-CM | POA: Insufficient documentation

## 2024-03-18 DIAGNOSIS — L409 Psoriasis, unspecified: Secondary | ICD-10-CM | POA: Insufficient documentation

## 2024-03-18 DIAGNOSIS — M2550 Pain in unspecified joint: Secondary | ICD-10-CM | POA: Insufficient documentation

## 2024-03-18 DIAGNOSIS — R112 Nausea with vomiting, unspecified: Secondary | ICD-10-CM | POA: Insufficient documentation

## 2024-03-18 DIAGNOSIS — Z0000 Encounter for general adult medical examination without abnormal findings: Secondary | ICD-10-CM | POA: Diagnosis not present

## 2024-03-18 DIAGNOSIS — M1990 Unspecified osteoarthritis, unspecified site: Secondary | ICD-10-CM | POA: Insufficient documentation

## 2024-03-18 DIAGNOSIS — N951 Menopausal and female climacteric states: Secondary | ICD-10-CM

## 2024-03-18 DIAGNOSIS — R1310 Dysphagia, unspecified: Secondary | ICD-10-CM | POA: Insufficient documentation

## 2024-03-18 DIAGNOSIS — N189 Chronic kidney disease, unspecified: Secondary | ICD-10-CM | POA: Diagnosis not present

## 2024-03-18 DIAGNOSIS — Z1231 Encounter for screening mammogram for malignant neoplasm of breast: Secondary | ICD-10-CM | POA: Diagnosis not present

## 2024-03-18 LAB — URINALYSIS, ROUTINE W REFLEX MICROSCOPIC
Bilirubin Urine: NEGATIVE
Ketones, ur: NEGATIVE
Leukocytes,Ua: NEGATIVE
Nitrite: POSITIVE — AB
Specific Gravity, Urine: 1.025 (ref 1.000–1.030)
Total Protein, Urine: NEGATIVE
Urine Glucose: NEGATIVE
Urobilinogen, UA: 1 (ref 0.0–1.0)
pH: 6 (ref 5.0–8.0)

## 2024-03-18 LAB — CBC WITH DIFFERENTIAL/PLATELET
Basophils Absolute: 0 K/uL (ref 0.0–0.1)
Basophils Relative: 0.2 % (ref 0.0–3.0)
Eosinophils Absolute: 0.1 K/uL (ref 0.0–0.7)
Eosinophils Relative: 2 % (ref 0.0–5.0)
HCT: 38.5 % (ref 36.0–46.0)
Hemoglobin: 12.1 g/dL (ref 12.0–15.0)
Lymphocytes Relative: 20.5 % (ref 12.0–46.0)
Lymphs Abs: 1.3 K/uL (ref 0.7–4.0)
MCHC: 31.4 g/dL (ref 30.0–36.0)
MCV: 85.5 fl (ref 78.0–100.0)
Monocytes Absolute: 0.3 K/uL (ref 0.1–1.0)
Monocytes Relative: 5 % (ref 3.0–12.0)
Neutro Abs: 4.5 K/uL (ref 1.4–7.7)
Neutrophils Relative %: 72.3 % (ref 43.0–77.0)
Platelets: 159 K/uL (ref 150.0–400.0)
RBC: 4.5 Mil/uL (ref 3.87–5.11)
RDW: 14.3 % (ref 11.5–15.5)
WBC: 6.3 K/uL (ref 4.0–10.5)

## 2024-03-18 LAB — LIPID PANEL
Cholesterol: 137 mg/dL (ref 0–200)
HDL: 49.3 mg/dL (ref >39.00)
LDL Cholesterol: 76 mg/dL (ref 0–99)
NonHDL: 87.75
Total CHOL/HDL Ratio: 3
Triglycerides: 57 mg/dL (ref 0.0–149.0)
VLDL: 11.4 mg/dL (ref 0.0–40.0)

## 2024-03-18 LAB — COMPREHENSIVE METABOLIC PANEL WITH GFR
ALT: 30 U/L (ref 0–35)
AST: 30 U/L (ref 0–37)
Albumin: 4.3 g/dL (ref 3.5–5.2)
Alkaline Phosphatase: 135 U/L — ABNORMAL HIGH (ref 39–117)
BUN: 16 mg/dL (ref 6–23)
CO2: 28 meq/L (ref 19–32)
Calcium: 8.7 mg/dL (ref 8.4–10.5)
Chloride: 111 meq/L (ref 96–112)
Creatinine, Ser: 0.96 mg/dL (ref 0.40–1.20)
GFR: 66.48 mL/min (ref >60.00)
Glucose, Bld: 89 mg/dL (ref 70–99)
Potassium: 4.9 meq/L (ref 3.5–5.1)
Sodium: 146 meq/L — ABNORMAL HIGH (ref 135–145)
Total Bilirubin: 0.7 mg/dL (ref 0.2–1.2)
Total Protein: 6.1 g/dL (ref 6.0–8.3)

## 2024-03-18 LAB — B12 AND FOLATE PANEL
Folate: 5.9 ng/mL — ABNORMAL LOW (ref >5.9)
Vitamin B-12: 254 pg/mL (ref 211–911)

## 2024-03-18 LAB — TSH: TSH: 0.67 u[IU]/mL (ref 0.35–5.50)

## 2024-03-18 LAB — HEMOGLOBIN A1C: Hgb A1c MFr Bld: 4.8 % (ref 4.6–6.5)

## 2024-03-18 LAB — VITAMIN D 25 HYDROXY (VIT D DEFICIENCY, FRACTURES): VITD: 9.23 ng/mL — ABNORMAL LOW (ref 30.00–100.00)

## 2024-03-18 MED ORDER — ESTRADIOL 0.1 MG/GM VA CREA
TOPICAL_CREAM | VAGINAL | 2 refills | Status: AC
Start: 2024-03-18 — End: ?

## 2024-03-18 NOTE — Progress Notes (Signed)
 Assessment & Plan:  Routine physical examination Assessment & Plan: Clinical breast exam performed.  Pap smear obtained.  Encouraged continued exercise.  Patient will schedule mammogram.  Orders: -     Lipid panel -     TSH -     Hemoglobin A1c -     CBC with Differential/Platelet -     Comprehensive metabolic panel with GFR -     VITAMIN D  25 Hydroxy (Vit-D Deficiency, Fractures) -     Cytology - PAP  Encounter for screening mammogram for malignant neoplasm of breast -     3D Screening Mammogram, Left and Right; Future  Anemia in chronic kidney disease, unspecified CKD stage -     CBC with Differential/Platelet -     Urinalysis, Routine w reflex microscopic -     Iron, TIBC and Ferritin Panel -     B12 and Folate Panel  Vaginal dryness, menopausal Assessment & Plan: Findings consistent with vaginal atrophy.  No personal or family history of breast cancer.  Start vaginal Estrace .   Orders: -     Estradiol ; 0.5 g intravaginally 1-3 times per week.  Dispense: 42.5 g; Refill: 2     Return precautions given.   Risks, benefits, and alternatives of the medications and treatment plan prescribed today were discussed, and patient expressed understanding.   Education regarding symptom management and diagnosis given to patient on AVS either electronically or printed.  No follow-ups on file.  [name] Northern, FNP  Subjective:    Patient ID: Barbara Faulkner, female    DOB: Jul 26, 1966, 58 y.o.   MRN: [id number]  CC: Barbara Faulkner is a 58 y.o. female who presents today for physical exam.    HPI: Complains of vaginal dryness with intercourse  No personal or family h/o breast cancer  She is using conconut oil with some relief     Denies blood in stool, urine or coming from vagina.  Following with Dr Jacobo for IDA, Dr Dominica for CKD, Dr Chipper for anxiety and depression   Colorectal Cancer Screening: colonoscopy on September 18, 2021 ; repeat in 3 years EGD  07/19/2018  Breast Cancer Screening: Mammogram due Cervical Cancer Screening: obtained 06/2022, NILM, negative HPV; transformation zone absent Bone Health screening/DEXA for 65+: No increased fracture risk. Defer screening at this time.  Lung Cancer Screening: Doesn't have 20 year pack year history and age > 77 years yo 33 years       Tetanus - UTD Exercise: Gets regular exercise walking dogs daily.   Alcohol use:  none Smoking/tobacco use: former, quit 2005    Health Maintenance  Topic Date Due   Hepatitis B Vaccine (1 of 3 - 19+ 3-dose series) Never done   COVID-19 Vaccine (3 - Pfizer risk series) 01/05/2020   Pap Smear  06/14/2023   Mammogram  12/01/2023   Flu Shot  04/01/2024   Colon Cancer Screening  09/18/2024   DTaP/Tdap/Td vaccine (3 - Td or Tdap) 09/21/2031   Pneumococcal Vaccination  Completed   Hepatitis C Screening  Completed   HIV Screening  Completed   Zoster (Shingles) Vaccine  Completed   HPV Vaccine  Aged Out   Meningitis B Vaccine  Aged Out    ALLERGIES: Shellfish allergy and Nsaids  Current Outpatient Medications on File Prior to Visit  Medication Sig Dispense Refill   calcitRIOL (ROCALTROL) 0.5 MCG capsule Take 0.5 mcg by mouth.     cyclobenzaprine  (FLEXERIL ) 5 MG tablet Take 1 tablet (  5 mg total) by mouth at bedtime as needed for muscle spasms. 90 tablet 1   EPINEPHrine  0.3 mg/0.3 mL IJ SOAJ injection Inject into the muscle.     gabapentin  (NEURONTIN ) 600 MG tablet Take 1 tablet (600 mg total) by mouth at bedtime. 90 tablet 1   lamoTRIgine  (LAMICTAL ) 150 MG tablet Take 150 mg by mouth daily.     oxyCODONE -acetaminophen  (PERCOCET/ROXICET) 5-325 MG tablet Take by mouth every 8 (eight) hours as needed for severe pain (pain score 7-10).     QUEtiapine (SEROQUEL) 100 MG tablet Take 100 mg by mouth at bedtime.     traMADol  (ULTRAM -ER) 200 MG 24 hr tablet Take 1 tablet (200 mg total) by mouth daily. 30 tablet 2   Current Facility-Administered Medications on  File Prior to Visit  Medication Dose Route Frequency Provider Last Rate Last Admin   lidocaine  (PF) (XYLOCAINE ) 1 % injection 2 mL  2 mL Intradermal Once Matthews, Jason J, MD        Review of Systems  Constitutional:  Negative for chills, fever and unexpected weight change.  HENT:  Negative for congestion.   Respiratory:  Negative for cough.   Cardiovascular:  Negative for chest pain, palpitations and leg swelling.  Gastrointestinal:  Negative for nausea and vomiting.  Genitourinary:  Negative for difficulty urinating, pelvic pain and vaginal bleeding.  Musculoskeletal:  Negative for arthralgias and myalgias.  Skin:  Negative for rash.  Neurological:  Negative for headaches.  Hematological:  Negative for adenopathy.  Psychiatric/Behavioral:  Negative for confusion.       Objective:    BP 110/78   Pulse 94   Temp 97.9 F (36.6 C)   Resp 20   Ht 5' 3.5 (1.613 m)   Wt 156 lb (70.8 kg)   LMP 04/02/2014   SpO2 97%   BMI 27.20 kg/m   BP Readings from Last 3 Encounters:  03/18/24 110/78  02/29/24 120/80  01/26/24 (!) 126/91   Wt Readings from Last 3 Encounters:  03/18/24 156 lb (70.8 kg)  03/07/24 156 lb (70.8 kg)  02/29/24 156 lb (70.8 kg)    Physical Exam Vitals reviewed.  Constitutional:      Appearance: Normal appearance. She is well-developed.  Eyes:     Conjunctiva/sclera: Conjunctivae normal.  Neck:     Thyroid : No thyroid  mass or thyromegaly.  Cardiovascular:     Rate and Rhythm: Normal rate and regular rhythm.     Pulses: Normal pulses.     Heart sounds: Normal heart sounds.  Pulmonary:     Effort: Pulmonary effort is normal.     Breath sounds: Normal breath sounds. No wheezing, rhonchi or rales.  Chest:  Breasts:    Breasts are symmetrical.     Right: No inverted nipple, mass, nipple discharge, skin change or tenderness.     Left: No inverted nipple, mass, nipple discharge, skin change or tenderness.  Abdominal:     General: Bowel sounds are  normal. There is no distension.     Palpations: Abdomen is soft. Abdomen is not rigid. There is no fluid wave or mass.     Tenderness: There is no abdominal tenderness. There is no guarding or rebound.  Genitourinary:    Cervix: No cervical motion tenderness, discharge or friability.     Uterus: Not enlarged, not fixed and not tender.      Adnexa:        Right: No mass, tenderness or fullness.  Left: No mass, tenderness or fullness.       Comments: Pap performed. No CMT. Unable to appreciated ovaries. Lymphadenopathy:     Head:     Right side of head: No submental, submandibular, tonsillar, preauricular, posterior auricular or occipital adenopathy.     Left side of head: No submental, submandibular, tonsillar, preauricular, posterior auricular or occipital adenopathy.     Cervical:     Right cervical: No superficial, deep or posterior cervical adenopathy.    Left cervical: No superficial, deep or posterior cervical adenopathy.     Upper Body:     Right upper body: No pectoral adenopathy.     Left upper body: No pectoral adenopathy.  Skin:    General: Skin is warm and dry.  Neurological:     Mental Status: She is alert.  Psychiatric:        Speech: Speech normal.        Behavior: Behavior normal.        Thought Content: Thought content normal.

## 2024-03-18 NOTE — Patient Instructions (Addendum)
 Please call  and schedule your 3D mammogram and /or bone density scan as we discussed.   [facility]  ( new location in 2023)  [address], [place], [place] [address]  [place], [place]  [phone]   Trial of vaginal estrace for dryness  Let me know how you are doing. Health Maintenance for Postmenopausal Women Menopause is a normal process in which your ability to get pregnant comes to an end. This process happens slowly over many months or years, usually between the ages of 51 and 21. Menopause is complete when you have missed your menstrual period for 12 months. It is important to talk with your health care provider about some of the most common conditions that affect women after menopause (postmenopausal women). These include heart disease, cancer, and bone loss (osteoporosis). Adopting a healthy lifestyle and getting preventive care can help to promote your health and wellness. The actions you take can also lower your chances of developing some of these common conditions. What are the signs and symptoms of menopause? During menopause, you may have the following symptoms: Hot flashes. These can be moderate or severe. Night sweats. Decrease in sex drive. Mood swings. Headaches. Tiredness (fatigue). Irritability. Memory problems. Problems falling asleep or staying asleep. Talk with your health care provider about treatment options for your symptoms. Do I need hormone replacement therapy? Hormone replacement therapy is effective in treating symptoms that are caused by menopause, such as hot flashes and night sweats. Hormone replacement carries certain risks, especially as you become older. If you are thinking about using estrogen or estrogen with progestin, discuss the benefits and risks with your health care provider. How can I reduce my risk for heart disease and stroke? The risk of heart disease, heart attack, and stroke increases as you age. One of the  causes may be a change in the body's hormones during menopause. This can affect how your body uses dietary fats, triglycerides, and cholesterol. Heart attack and stroke are medical emergencies. There are many things that you can do to help prevent heart disease and stroke. Watch your blood pressure High blood pressure causes heart disease and increases the risk of stroke. This is more likely to develop in people who have high blood pressure readings or are overweight. Have your blood pressure checked: Every 3-5 years if you are 5-26 years of age. Every year if you are 19 years old or older. Eat a healthy diet  Eat a diet that includes plenty of vegetables, fruits, low-fat dairy products, and lean protein. Do not eat a lot of foods that are high in solid fats, added sugars, or sodium. Get regular exercise Get regular exercise. This is one of the most important things you can do for your health. Most adults should: Try to exercise for at least 150 minutes each week. The exercise should increase your heart rate and make you sweat (moderate-intensity exercise). Try to do strengthening exercises at least twice each week. Do these in addition to the moderate-intensity exercise. Spend less time sitting. Even light physical activity can be beneficial. Other tips Work with your health care provider to achieve or maintain a healthy weight. Do not use any products that contain nicotine or tobacco. These products include cigarettes, chewing tobacco, and vaping devices, such as e-cigarettes. If you need help quitting, ask your health care provider. Know your numbers. Ask your health care provider to check your cholesterol and your blood sugar (glucose). Continue to have your blood tested  as directed by your health care provider. Do I need screening for cancer? Depending on your health history and family history, you may need to have cancer screenings at different stages of your life. This may include  screening for: Breast cancer. Cervical cancer. Lung cancer. Colorectal cancer. What is my risk for osteoporosis? After menopause, you may be at increased risk for osteoporosis. Osteoporosis is a condition in which bone destruction happens more quickly than new bone creation. To help prevent osteoporosis or the bone fractures that can happen because of osteoporosis, you may take the following actions: If you are 58-58 years old, get at least 1,000 mg of calcium and at least 600 international units (IU) of vitamin D  per day. If you are older than age 75 but younger than age 12, get at least 1,200 mg of calcium and at least 600 international units (IU) of vitamin D  per day. If you are older than age 76, get at least 1,200 mg of calcium and at least 800 international units (IU) of vitamin D  per day. Smoking and drinking excessive alcohol increase the risk of osteoporosis. Eat foods that are rich in calcium and vitamin D , and do weight-bearing exercises several times each week as directed by your health care provider. How does menopause affect my mental health? Depression may occur at any age, but it is more common as you become older. Common symptoms of depression include: Feeling depressed. Changes in sleep patterns. Changes in appetite or eating patterns. Feeling an overall lack of motivation or enjoyment of activities that you previously enjoyed. Frequent crying spells. Talk with your health care provider if you think that you are experiencing any of these symptoms. General instructions See your health care provider for regular wellness exams and vaccines. This may include: Scheduling regular health, dental, and eye exams. Getting and maintaining your vaccines. These include: Influenza vaccine. Get this vaccine each year before the flu season begins. Pneumonia vaccine. Shingles vaccine. Tetanus, diphtheria, and pertussis (Tdap) booster vaccine. Your health care provider may also recommend  other immunizations. Tell your health care provider if you have ever been abused or do not feel safe at home. Summary Menopause is a normal process in which your ability to get pregnant comes to an end. This condition causes hot flashes, night sweats, decreased interest in sex, mood swings, headaches, or lack of sleep. Treatment for this condition may include hormone replacement therapy. Take actions to keep yourself healthy, including exercising regularly, eating a healthy diet, watching your weight, and checking your blood pressure and blood sugar levels. Get screened for cancer and depression. Make sure that you are up to date with all your vaccines. This information is not intended to replace advice given to you by your health care provider. Make sure you discuss any questions you have with your health care provider. Document Revised: 01/07/2021 Document Reviewed: 01/07/2021 Elsevier Patient Education  2024 [employer].

## 2024-03-18 NOTE — Telephone Encounter (Signed)
 Pt seen in office today for office visit and labs.

## 2024-03-18 NOTE — Telephone Encounter (Unsigned)
 Copied from CRM [id number]. Topic: Clinical - Request for Lab/Test Order >> Mar 16, 2024  2:43 PM Chasity T wrote: Reason for CRM: Patient is calling in to get lab appointment scheduled but no labs were ordered. She stated that Dr Dineen mentioned that orders would be placed and she wanted to come in tomorrow to get them done. Please contact patient regarding labs.   She also wants to know if a paps smear can be done at the appointment as well

## 2024-03-19 LAB — IRON,TIBC AND FERRITIN PANEL
%SAT: 30 % (ref 16–45)
Ferritin: 54 ng/mL (ref 16–232)
Iron: 91 ug/dL (ref 45–160)
TIBC: 299 ug/dL (ref 250–450)

## 2024-03-20 ENCOUNTER — Encounter: Payer: Self-pay | Admitting: Family

## 2024-03-20 NOTE — Assessment & Plan Note (Signed)
 Findings consistent with vaginal atrophy.  No personal or family history of breast cancer.  Start vaginal Estrace .

## 2024-03-20 NOTE — Assessment & Plan Note (Signed)
 Clinical breast exam performed.  Pap smear obtained.  Encouraged continued exercise.  Patient will schedule mammogram.

## 2024-03-25 LAB — CYTOLOGY - PAP
Comment: NEGATIVE
Diagnosis: UNDETERMINED — AB
High risk HPV: POSITIVE — AB

## 2024-03-29 ENCOUNTER — Ambulatory Visit: Attending: Nurse Practitioner | Admitting: Nurse Practitioner

## 2024-03-29 ENCOUNTER — Other Ambulatory Visit: Payer: Self-pay

## 2024-03-29 DIAGNOSIS — Z79899 Other long term (current) drug therapy: Secondary | ICD-10-CM

## 2024-03-29 DIAGNOSIS — G894 Chronic pain syndrome: Secondary | ICD-10-CM | POA: Diagnosis not present

## 2024-03-29 DIAGNOSIS — M722 Plantar fascial fibromatosis: Secondary | ICD-10-CM

## 2024-03-29 DIAGNOSIS — M797 Fibromyalgia: Secondary | ICD-10-CM | POA: Diagnosis not present

## 2024-03-29 DIAGNOSIS — M1712 Unilateral primary osteoarthritis, left knee: Secondary | ICD-10-CM | POA: Diagnosis not present

## 2024-03-29 DIAGNOSIS — M545 Low back pain, unspecified: Secondary | ICD-10-CM | POA: Diagnosis not present

## 2024-03-29 DIAGNOSIS — M79605 Pain in left leg: Secondary | ICD-10-CM

## 2024-03-29 DIAGNOSIS — M5416 Radiculopathy, lumbar region: Secondary | ICD-10-CM | POA: Diagnosis not present

## 2024-03-29 MED ORDER — OXYCODONE-ACETAMINOPHEN 5-325 MG PO TABS: 1.0000 | ORAL_TABLET | Freq: Three times a day (TID) | ORAL | 0 refills | Status: DC | PRN

## 2024-03-29 NOTE — Progress Notes (Signed)
 PROVIDER NOTE: Interpretation of information contained herein should be left to medically-trained personnel. Specific patient instructions are provided elsewhere under Patient Instructions section of medical record. This document was created in part using AI and STT-dictation technology, any transcriptional errors that may result from this process are unintentional.  Patient: Barbara Faulkner  Service: E/M   PCP: Dineen [name] MATSU, FNP  DOB: [birth year] Barbara Faulkner  DOS: 03/29/2024  Provider: Emmy MARLA Blanch, NP  MRN: [id number]  Delivery: Virtual Visit  Specialty: Interventional Pain Management  Type: Established Patient  Setting: Ambulatory outpatient facility  Specialty designation: 09  Referring Prov.: Dineen [name] MATSU, FNP  Location: Remote location       Virtual Encounter - Pain Management PROVIDER NOTE: Information contained herein reflects review and annotations entered in association with encounter. Interpretation of such information and data should be left to medically-trained personnel. Information provided to patient can be located elsewhere in the medical record under Patient Instructions. Document created using STT-dictation technology, any transcriptional errors that may result from process are unintentional.    Contact & Pharmacy Preferred: [phone] Home: [phone] (home) Mobile: [phone] (mobile) E-mail: [email or web address] [id number] [place], [place] - [address] AT [facility] [address] [place] [address] Phone: [phone] Fax: [phone]   Pre-screening  Barbara Faulkner offered in-person vs virtual encounter. She indicated preferring virtual for this encounter.   Reason COVID-19*  Social distancing based on CDC and AMA recommendations.   I contacted MAR[facility]ETTA VOHS on 03/29/2024 via telephone.      I clearly identified myself as Emmy MARLA Blanch, NP. I verified that I was speaking with the correct person using  two identifiers (Name: Barbara Faulkner, and date of birth: Barbara Faulkner, Barbara Faulkner).  Consent I sought verbal advanced consent from Barbara Faulkner for virtual visit interactions. I informed Barbara Faulkner of possible security and privacy concerns, risks, and limitations associated with providing not-in-person medical evaluation and management services. I also informed Barbara Faulkner of the availability of in-person appointments. Finally, I informed her that there would be a charge for the virtual visit and that she could be  personally, fully or partially, financially responsible for it. [name] expressed understanding and agreed to proceed.   Historic Elements   Barbara Faulkner is a 58 y.o. year old, female patient evaluated today after our last contact on 03/09/2024. Barbara Faulkner  has a past medical history of Allergy (1980), Anxiety (03/14/2024), Chronic kidney disease (03/01/2021), Chronic pain, Chronic sinusitis, COVID-19, Depression (03/14/2004), Heart murmur, Hiatal hernia, HPV (human papilloma virus) anogenital infection, Macromastia, Migraine, Psoriatic arthritis (HCC), Shingles, and UTI (urinary tract infection). She also  has a past surgical history that includes Cholecystectomy (2009); Tubal ligation (1997); Laparoscopic gastric banding (2008); Gastric bypass; Breast biopsy (Right, 05/Faulkner/2019); Colonoscopy with propofol  (N/A, 07/19/2018); Esophagogastroduodenoscopy (egd) with propofol  (N/A, 07/19/2018); Breast reduction surgery (Bilateral, 04/03/2020); Colonoscopy with propofol  (N/A, 09/17/2021); Colonoscopy with propofol  (N/A, 09/18/2021); Reduction mammaplasty; Total hip arthroplasty (Left, 01/23/2023); and Joint replacement (01/28/2023). Barbara Faulkner has a current medication list which includes the following prescription(s): oxycodone -acetaminophen , calcitriol, cyclobenzaprine , epinephrine , estradiol , gabapentin , lamotrigine , quetiapine, and tramadol , and the following Facility-Administered Medications: lidocaine   (pf). She  reports that she quit smoking about 20 years ago. Her smoking use included cigarettes. She started smoking about 40 years ago. She has a 40 pack-year smoking history. She has never used smokeless tobacco. She reports that she does not currently use alcohol. She reports that  she does not use drugs. Barbara Faulkner is allergic to shellfish allergy and nsaids.  BMI: Estimated body mass index is 27.2 kg/m as calculated from the following:   Height as of 03/18/24: 5' 3.5 (1.613 m).   Weight as of 03/18/24: 156 lb (70.8 kg). Last encounter: 01/26/2024. Last procedure: Visit date not found.  HPI  Today, she is being contacted for medication management. No change in medical history since last visit.  Patient's pain is at baseline.  Patient continues multimodal pain regimen as prescribed.  States that it provides pain relief and improvement in functional status.   Pharmacotherapy Assessment  Analgesic: Oxycodone -acetaminophen  (Percocet) 5-325 mg tablet every 8 hours as needed for pain. MME=22.50 Monitoring: [place] PMP: PDMP reviewed during this encounter.       Pharmacotherapy: No side-effects or adverse reactions reported. Compliance: No problems identified. Effectiveness: Clinically acceptable. Plan: Refer to POC.  UDS:  Summary  Date Value Ref Range Status  10/01/2022 Note  Final    Comment:    ==================================================================== [employer] Select 13 (MW) ==================================================================== Test                             Result       Flag       Units  Drug Present and Declared for Prescription Verification   Oxycodone                       293          EXPECTED   ng/mg creat   Oxymorphone                    161          EXPECTED   ng/mg creat   Noroxycodone                   707          EXPECTED   ng/mg creat   Noroxymorphone                 144          EXPECTED   ng/mg creat    Sources of oxycodone  are scheduled  prescription medications.    Oxymorphone, noroxycodone, and noroxymorphone are expected    metabolites of oxycodone . Oxymorphone is also available as a    scheduled prescription medication.    Tramadol                        >3937        EXPECTED   ng/mg creat   O-Desmethyltramadol            >3937        EXPECTED   ng/mg creat   N-Desmethyltramadol            >3937        EXPECTED   ng/mg creat    Source of tramadol  is a prescription medication. O-desmethyltramadol    and N-desmethyltramadol are expected metabolites of tramadol .  ==================================================================== Test                      Result    Flag   Units      Ref Range   Creatinine              127  mg/dL      >=79 ==================================================================== Declared Medications:  The flagging and interpretation on this report are based on the  following declared medications.  Unexpected results may arise from  inaccuracies in the declared medications.   **Note: The testing scope of this panel includes these medications:   Oxycodone  (Percocet)  Tramadol  (Ultram )   **Note: The testing scope of this panel does not include the  following reported medications:   Acetaminophen  (Percocet)  Calcitriol  Cholecalciferol   Cyclobenzaprine  (Flexeril )  Duloxetine  (Cymbalta )  Gabapentin  (Neurontin )  Lamotrigine  (Lamictal )  Pantoprazole  (Protonix )  Quetiapine (Seroquel)  Vitamin D  ==================================================================== For clinical consultation, please call [phone]. ====================================================================    No results found for: CBDTHCR, D8THCCBX, D9THCCBX  Laboratory Chemistry Profile   Renal Lab Results  Component Value Date   BUN 16 03/18/2024   CREATININE 0.96 03/18/2024   BCR NOT APPLICABLE 07/14/2018   GFR 66.48 03/18/2024   GFRAA >60 02/15/2014   GFRNONAA 51 (L)  05/26/2022    Hepatic Lab Results  Component Value Date   AST Faulkner 03/18/2024   ALT Faulkner 03/18/2024   ALBUMIN 4.3 03/18/2024   ALKPHOS 135 (H) 03/18/2024   AMYLASE 33 09/25/2014   LIPASE 124 10/04/2013    Electrolytes Lab Results  Component Value Date   NA 146 (H) 03/18/2024   K 4.9 03/18/2024   CL 111 03/18/2024   CALCIUM 8.7 03/18/2024   MG 1.9 07/14/2018   PHOS 5.0 (H) 09/25/2014    Bone Lab Results  Component Value Date   VD25OH 9.23 (L) 03/18/2024    Inflammation (CRP: Acute Phase) (ESR: Chronic Phase) Lab Results  Component Value Date   CRP <1.0 03/10/2019   ESRSEDRATE 3 03/10/2019         Note: Above Lab results reviewed.  Imaging  DG Knee Complete 4 Views Left CLINICAL DATA:  Left knee pain.  EXAM: LEFT KNEE - COMPLETE 4+ VIEW  COMPARISON:  None Available.  FINDINGS: No evidence of fracture, dislocation, or joint effusion. Alignment and joint spaces are normal. No evidence of arthropathy or other focal bone abnormality. Soft tissues are unremarkable.  IMPRESSION: Negative radiographs of the left knee.  Electronically Signed   By: Andrea Gasman M.D.   On: 06/Faulkner/2025 20:07  Assessment  The primary encounter diagnosis was Chronic pain syndrome. Diagnoses of Lumbar pain with radiation down left leg, Fibromyalgia, Medication management, Primary osteoarthritis of left knee, Plantar fasciitis of right foot, and Lumbar radicular pain were also pertinent to this visit.  Plan of Care  Problem-specific:  We will continue on current medication regimen.  Prescribing drug monitoring (PDMP) reviewed; findings consistent with the use of prescribed medication and no evidence of narcotic misuse or abuse.  Schedule follow-up in 1 month for medication management with Janeal Abadi, NP.  Barbara Faulkner has a current medication list which includes the following long-term medication(s): gabapentin  and quetiapine.  Pharmacotherapy (Medications Ordered): Meds ordered  this encounter  Medications   oxyCODONE -acetaminophen  (PERCOCET/ROXICET) 5-325 MG tablet    Sig: Take 1 tablet by mouth every 8 (eight) hours as needed for severe pain (pain score 7-10).    Dispense:  90 tablet    Refill:  0   Orders:  No orders of the defined types were placed in this encounter.  Follow-up plan:   Return in about 1 month (around 04/29/2024) for (F2F), (MM), Emmy Blanch NP.          Recent Visits Date Type Provider Dept  06/Faulkner/25 Procedure  visit Marcelino Nurse, MD [facility]  01/26/24 Office Visit Neamiah Sciarra K, NP [facility]  Showing recent visits within past 90 days and meeting all other requirements Today's Visits Date Type Provider Dept  03/29/24 Office Visit Delene Morais K, NP [facility]  Showing today's visits and meeting all other requirements Future Appointments Date Type Provider Dept  04/27/24 Appointment Livan Hires K, NP [facility]  Showing future appointments within next 90 days and meeting all other requirements  I discussed the assessment and treatment plan with the patient. The patient was provided an opportunity to ask questions and all were answered. The patient agreed with the plan and demonstrated an understanding of the instructions.  Patient advised to call back or seek an in-person evaluation if the symptoms or condition worsens.  Duration of encounter: 20 minutes.  Note by: Emmy MARLA Blanch, NP Date: 03/29/2024; Time: 1:51 PM

## 2024-04-04 ENCOUNTER — Ambulatory Visit: Payer: Self-pay | Admitting: Family

## 2024-04-04 ENCOUNTER — Other Ambulatory Visit: Payer: Self-pay | Admitting: Family

## 2024-04-04 ENCOUNTER — Encounter: Payer: Self-pay | Admitting: Family

## 2024-04-04 DIAGNOSIS — R87610 Atypical squamous cells of undetermined significance on cytologic smear of cervix (ASC-US): Secondary | ICD-10-CM

## 2024-04-04 DIAGNOSIS — R899 Unspecified abnormal finding in specimens from other organs, systems and tissues: Secondary | ICD-10-CM

## 2024-04-04 DIAGNOSIS — Z8639 Personal history of other endocrine, nutritional and metabolic disease: Secondary | ICD-10-CM

## 2024-04-06 ENCOUNTER — Ambulatory Visit: Admitting: Podiatry

## 2024-04-06 DIAGNOSIS — D2371 Other benign neoplasm of skin of right lower limb, including hip: Secondary | ICD-10-CM | POA: Diagnosis not present

## 2024-04-06 NOTE — Progress Notes (Signed)
See telephone encounter from 08.06.2024.

## 2024-04-06 NOTE — Telephone Encounter (Signed)
 Spoke to pt regarding my chart message she is scheduled for lab appt next week and f.up appt on 04/26/24

## 2024-04-06 NOTE — Progress Notes (Signed)
 She presents today for a chief complaint of a painful lesion to the medial aspect fifth digit of the right foot.  Objective findings consist of rigid hammertoe deformity with PIPJ ankylosing resulting in enlargement of the joint and due to objective position resulting in a benign skin lesion between the toes.  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  Plan: Debridement of heloma molle.  And discussed in great detail surgical consideration.

## 2024-04-11 ENCOUNTER — Ambulatory Visit
Admission: RE | Admit: 2024-04-11 | Discharge: 2024-04-11 | Disposition: A | Discharge status: Home / Self Care | Source: Ambulatory Visit | Attending: Family | Admitting: Family

## 2024-04-11 ENCOUNTER — Ambulatory Visit: Admitting: Podiatry

## 2024-04-11 DIAGNOSIS — Z1231 Encounter for screening mammogram for malignant neoplasm of breast: Secondary | ICD-10-CM | POA: Insufficient documentation

## 2024-04-13 ENCOUNTER — Encounter: Payer: Self-pay | Admitting: Family

## 2024-04-13 ENCOUNTER — Ambulatory Visit: Admitting: Podiatry

## 2024-04-15 ENCOUNTER — Other Ambulatory Visit

## 2024-04-15 DIAGNOSIS — R899 Unspecified abnormal finding in specimens from other organs, systems and tissues: Secondary | ICD-10-CM

## 2024-04-15 NOTE — Addendum Note (Signed)
 Addended by: ORLANDO KINGDOM on: 04/15/2024 01:28 PM   Modules accepted: Orders

## 2024-04-19 LAB — URINALYSIS, ROUTINE W REFLEX MICROSCOPIC
Bilirubin Urine: NEGATIVE
Glucose, UA: NEGATIVE
Hgb urine dipstick: NEGATIVE
Hyaline Cast: NONE SEEN /LPF
Ketones, ur: NEGATIVE
Nitrite: POSITIVE — AB
Protein, ur: NEGATIVE
Specific Gravity, Urine: 1.021 (ref 1.001–1.035)
pH: 6 (ref 5.0–8.0)

## 2024-04-19 LAB — EXTRA URINE SPECIMEN

## 2024-04-19 LAB — URINE CULTURE
MICRO NUMBER:: 16838211
SPECIMEN QUALITY:: ADEQUATE

## 2024-04-26 ENCOUNTER — Ambulatory Visit: Payer: Self-pay | Admitting: Family

## 2024-04-26 ENCOUNTER — Encounter: Payer: Self-pay | Admitting: Family

## 2024-04-26 ENCOUNTER — Telehealth: Payer: Self-pay | Admitting: Family

## 2024-04-26 ENCOUNTER — Ambulatory Visit: Admitting: Family

## 2024-04-26 VITALS — BP 130/72 | HR 90 | Temp 97.5°F | Ht 63.0 in | Wt 156.2 lb

## 2024-04-26 DIAGNOSIS — R748 Abnormal levels of other serum enzymes: Secondary | ICD-10-CM

## 2024-04-26 DIAGNOSIS — N3000 Acute cystitis without hematuria: Secondary | ICD-10-CM

## 2024-04-26 DIAGNOSIS — E559 Vitamin D deficiency, unspecified: Secondary | ICD-10-CM

## 2024-04-26 DIAGNOSIS — R87810 Cervical high risk human papillomavirus (HPV) DNA test positive: Secondary | ICD-10-CM | POA: Diagnosis not present

## 2024-04-26 DIAGNOSIS — R899 Unspecified abnormal finding in specimens from other organs, systems and tissues: Secondary | ICD-10-CM

## 2024-04-26 MED ORDER — NITROFURANTOIN MONOHYD MACRO 100 MG PO CAPS
100.0000 mg | ORAL_CAPSULE | Freq: Two times a day (BID) | ORAL | 0 refills | Status: DC
Start: 2024-04-26 — End: 2024-05-10

## 2024-04-26 MED ORDER — CHOLECALCIFEROL 1.25 MG (50000 UT) PO TABS: ORAL_TABLET | ORAL | 0 refills | Status: AC

## 2024-04-26 NOTE — Patient Instructions (Addendum)
 Your vitamin D  level is low.  You may start prescription for vitamin  D 50000 units by mouth ONCE weekly for 8 weeks only. I have sent this to your pharmacy. After 8 weeks, you MAY stop this dose and resume over the counter cholecalciferol  800 units daily.  I will not refill vitamin d  50,000 units until vitamin D  is rechecked as vitamin d  is toxic if taking more than needed.   Please call our office to a follow up visit with me and we can recheck vitamin d  level in a few months.   Start macrobid  ( antibiotic) for urinary tract infection  Ensure to take probiotics while on antibiotics and also for 2 weeks after completion. This can either be by eating yogurt daily or taking a probiotic supplement over the counter such as Culturelle.It is important to re-colonize the gut with good bacteria and also to prevent any diarrheal infections associated with antibiotic use.   Please call  and schedule your 3D mammogram and /or bone density scan as we discussed.   [facility]  ( new location in 2023)  [address], [place], [place] [address]  [place], [place]  [phone]   Referral to endocrine to evaluate alk phos  I have reached out to nephrology regarding vitamin D  replacement; after I speak with them, I will let you know dose that I can prescribe due to history of hyperparathyroidism.

## 2024-04-26 NOTE — Telephone Encounter (Signed)
 [name] has spoke to Dr Dennise!

## 2024-04-26 NOTE — Progress Notes (Unsigned)
   Assessment & Plan:  There are no diagnoses linked to this encounter.   Return precautions given.   Risks, benefits, and alternatives of the medications and treatment plan prescribed today were discussed, and patient expressed understanding.   Education regarding symptom management and diagnosis given to patient on AVS either electronically or printed.  No follow-ups on file.  [name] Northern, FNP  Subjective:    Patient ID: LICHELLE VIETS, female    DOB: Jan 16, 1966, 58 y.o.   MRN: [id number]  CC: KEILAH LEMIRE is a 58 y.o. female who presents today for follow up.   HPI: HPI Discuss labs Low folate, elevated alk phos   GYN appointment next month for abnormal PAP smear positive HPV , ASC US   Alk phos 135 ( 171)  06/11/22 alk phos 171 Alk phnos 71 10/04/2013  Liver fraction 58 Allergies: Shellfish allergy and Nsaids Current Outpatient Medications on File Prior to Visit  Medication Sig Dispense Refill   calcitRIOL (ROCALTROL) 0.5 MCG capsule Take 0.5 mcg by mouth.     cyclobenzaprine  (FLEXERIL ) 5 MG tablet Take 1 tablet (5 mg total) by mouth at bedtime as needed for muscle spasms. 90 tablet 1   EPINEPHrine  0.3 mg/0.3 mL IJ SOAJ injection Inject into the muscle.     estradiol  (ESTRACE ) 0.1 MG/GM vaginal cream 0.5 g intravaginally 1-3 times per week. 42.5 g 2   gabapentin  (NEURONTIN ) 600 MG tablet Take 1 tablet (600 mg total) by mouth at bedtime. 90 tablet 1   lamoTRIgine  (LAMICTAL ) 150 MG tablet Take 150 mg by mouth daily.     oxyCODONE -acetaminophen  (PERCOCET/ROXICET) 5-325 MG tablet Take 1 tablet by mouth every 8 (eight) hours as needed for severe pain (pain score 7-10). 90 tablet 0   QUEtiapine (SEROQUEL) 100 MG tablet Take 100 mg by mouth at bedtime.     Current Facility-Administered Medications on File Prior to Visit  Medication Dose Route Frequency Provider Last Rate Last Admin   lidocaine  (PF) (XYLOCAINE ) 1 % injection 2 mL  2 mL Intradermal Once Matthews, Jason J, MD         Review of Systems    Objective:    LMP 04/02/2014  BP Readings from Last 3 Encounters:  03/18/24 110/78  02/29/24 120/80  01/26/24 (!) 126/91   Wt Readings from Last 3 Encounters:  03/18/24 156 lb (70.8 kg)  03/07/24 156 lb (70.8 kg)  02/29/24 156 lb (70.8 kg)    Physical Exam

## 2024-04-26 NOTE — Telephone Encounter (Signed)
 Call Dr Dennise Nephrology Pt sees him next mont  I sent him this staff message however he is not on epic  Can I safely replace vit D in setting of secondary hyperparathyroidism?  Dr Dennise,  I wanted to touch base with a mutual patient whom you see next month  She has h/o duodenal switch  She has h/o vitamin D  deficiency, most recently 9.  I wanted to collaborate with you prior to recommending replacement of Vit D as she has h/o secondary hyperparathyroidism and Im concerned with risk of hypercalcemia and hypercalciuria.  Can I safely replete her vitamin D ?  I typically use  8 weeks of 50,000 units and then transition to 800 units daily.   [facility] Quant Cell [phone]

## 2024-04-26 NOTE — Telephone Encounter (Signed)
 LVM with Dr Dennise nurse @ [phone]  to give me a call back at [phone] in regards to message below per [name]

## 2024-04-27 ENCOUNTER — Ambulatory Visit: Attending: Nurse Practitioner | Admitting: Nurse Practitioner

## 2024-04-27 ENCOUNTER — Encounter: Payer: Self-pay | Admitting: Nurse Practitioner

## 2024-04-27 DIAGNOSIS — M79605 Pain in left leg: Secondary | ICD-10-CM

## 2024-04-27 DIAGNOSIS — M797 Fibromyalgia: Secondary | ICD-10-CM | POA: Diagnosis not present

## 2024-04-27 DIAGNOSIS — G894 Chronic pain syndrome: Secondary | ICD-10-CM

## 2024-04-27 DIAGNOSIS — M545 Low back pain, unspecified: Secondary | ICD-10-CM

## 2024-04-27 DIAGNOSIS — M1612 Unilateral primary osteoarthritis, left hip: Secondary | ICD-10-CM | POA: Diagnosis not present

## 2024-04-27 DIAGNOSIS — Z79899 Other long term (current) drug therapy: Secondary | ICD-10-CM

## 2024-04-27 DIAGNOSIS — M48061 Spinal stenosis, lumbar region without neurogenic claudication: Secondary | ICD-10-CM

## 2024-04-27 DIAGNOSIS — M7918 Myalgia, other site: Secondary | ICD-10-CM

## 2024-04-27 MED ORDER — OXYCODONE-ACETAMINOPHEN 5-325 MG PO TABS: 1.0000 | ORAL_TABLET | Freq: Three times a day (TID) | ORAL | 0 refills | Status: AC | PRN

## 2024-04-27 MED ORDER — TRAMADOL HCL ER 200 MG PO TB24: 200.0000 mg | ORAL_TABLET | Freq: Every day | ORAL | 2 refills | Status: DC

## 2024-04-27 MED ORDER — GABAPENTIN 600 MG PO TABS: 600.0000 mg | ORAL_TABLET | Freq: Every day | ORAL | 1 refills | Status: DC

## 2024-04-27 NOTE — Progress Notes (Signed)
 PROVIDER NOTE: Interpretation of information contained herein should be left to medically-trained personnel. Specific patient instructions are provided elsewhere under Patient Instructions section of medical record. This document was created in part using AI and STT-dictation technology, any transcriptional errors that may result from this process are unintentional.  Patient: Barbara Faulkner  Service: E/M   PCP: Dineen [name] MATSU, FNP  DOB: 05-16-1966  DOS: 04/27/2024  Provider: Emmy MARLA Blanch, NP  MRN: [id number]  Delivery: Face-to-face  Specialty: Interventional Pain Management  Type: Established Patient  Setting: Ambulatory outpatient facility  Specialty designation: 09  Referring Prov.: Dineen [name] MATSU, FNP  Location: Outpatient office facility       History of present illness (HPI) Barbara Faulkner, a 58 y.o. year old female, is here today because of her No primary diagnosis found.. 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   Pain Assessment: Severity of Chronic pain is reported as a 5 /10. Location: Knee Right/denies. Onset: More than a month ago. Quality: Constant, Sharp. Timing: Constant. Modifying factor(s): meds, knee brace. Vitals:  height is 5' 3 (1.6 m) and weight is 157 lb (71.2 kg). Her temperature is 98 F (36.7 C). Her blood pressure is 117/84 and her pulse is 106 (abnormal). Her respiration is 16 and oxygen saturation is 99%.  BMI: Estimated body mass index is 27.81 kg/m as calculated from the following:   Height as of this encounter: 5' 3 (1.6 m).   Weight as of this encounter: 157 lb (71.2 kg).  Last encounter: 03/29/2024. Last procedure: 02/29/2024  Reason for encounter: medication  management. No change in medical history since last visit.  Patient's pain is at baseline.  Patient continues multimodal pain regimen as prescribed.  States that it provides pain relief and improvement in functional status.   Pharmacotherapy Assessment   Analgesic: Oxycodone -acetaminophen  (Percocet) 5-325 mg tablet every 8 hours as needed for pain. MME=22.50 Gabapentin  600 mg tablet daily at bedtime Tramadol  ER 200 Mg daily MME=40 Monitoring: [place] PMP: PDMP reviewed during this encounter.       Pharmacotherapy: No side-effects or adverse reactions reported. Compliance: No problems identified. Effectiveness: Clinically acceptable.  Dayna Pulling, RN  04/27/2024  1:21 PM  Sign when Signing Visit Nursing Pain Medication Assessment:  Safety precautions to be maintained throughout the outpatient stay will include: orient to surroundings, keep bed in low position, maintain call bell within reach at all times, provide assistance with transfer out of bed and ambulation.  Medication Inspection Compliance: Pill count conducted under aseptic conditions, in front of the patient. Neither the pills nor the bottle was removed from the patient's sight at any time. Once count was completed pills were immediately returned to the patient in their original bottle.  Medication: Oxycodone /APAP Pill/Patch Count: 61 of 90 pills/patches remain Pill/Patch Appearance: Markings consistent with prescribed medication Bottle Appearance: Standard pharmacy container. Clearly labeled. Filled Date: 7 / 29 / 2025 Last Medication intake:  Today  Pt did not bring Tramadol  today.  UDS:  Summary  Date Value Ref Range Status  10/01/2022 Note  Final    Comment:    ==================================================================== [facility] (MW) ==================================================================== Test  Result       Flag       Units  Drug Present and Declared for  Prescription Verification   Oxycodone                       293          EXPECTED   ng/mg creat   Oxymorphone                    161          EXPECTED   ng/mg creat   Noroxycodone                   707          EXPECTED   ng/mg creat   Noroxymorphone                 144          EXPECTED   ng/mg creat    Sources of oxycodone  are scheduled prescription medications.    Oxymorphone, noroxycodone, and noroxymorphone are expected    metabolites of oxycodone . Oxymorphone is also available as a    scheduled prescription medication.    Tramadol                        >3937        EXPECTED   ng/mg creat   O-Desmethyltramadol            >3937        EXPECTED   ng/mg creat   N-Desmethyltramadol            >3937        EXPECTED   ng/mg creat    Source of tramadol  is a prescription medication. O-desmethyltramadol    and N-desmethyltramadol are expected metabolites of tramadol .  ==================================================================== Test                      Result    Flag   Units      Ref Range   Creatinine              127              mg/dL      >=79 ==================================================================== Declared Medications:  The flagging and interpretation on this report are based on the  following declared medications.  Unexpected results may arise from  inaccuracies in the declared medications.   **Note: The testing scope of this panel includes these medications:   Oxycodone  (Percocet)  Tramadol  (Ultram )   **Note: The testing scope of this panel does not include the  following reported medications:   Acetaminophen  (Percocet)  Calcitriol  Cholecalciferol   Cyclobenzaprine  (Flexeril )  Duloxetine  (Cymbalta )  Gabapentin  (Neurontin )  Lamotrigine  (Lamictal )  Pantoprazole  (Protonix )  Quetiapine (Seroquel)  Vitamin D  ==================================================================== For clinical consultation, please call [phone]. ====================================================================     No results found for: CBDTHCR No results found for: D8THCCBX No results found for: D9THCCBX  ROS  Constitutional: Denies any fever or chills Gastrointestinal: No reported hemesis, hematochezia, vomiting, or acute GI distress Musculoskeletal: Denies any acute onset joint swelling, redness, loss of ROM, or weakness Neurological: No reported episodes of acute onset apraxia, aphasia, dysarthria, agnosia, amnesia, paralysis, loss of coordination, or loss of consciousness  Medication Review  Cholecalciferol , EPINEPHrine , QUEtiapine, calcitRIOL, cyclobenzaprine , estradiol , gabapentin , lamoTRIgine , nitrofurantoin  (macrocrystal-monohydrate), and oxyCODONE -acetaminophen   History Review  Allergy: Barbara Faulkner is allergic to  shellfish allergy and nsaids. Drug: Barbara Faulkner  reports no history of drug use. Alcohol:  reports that she does not currently use alcohol. Tobacco:  reports that she quit smoking about 20 years ago. Her smoking use included cigarettes. She started smoking about 40 years ago. She has a 40 pack-year smoking history. She has never used smokeless tobacco. Social: Barbara Faulkner  reports that she quit smoking about 20 years ago. Her smoking use included cigarettes. She started smoking about 40 years ago. She has a 40 pack-year smoking history. She has never used smokeless tobacco. She reports that she does not currently use alcohol. She reports that she does not use drugs. Medical:  has a past medical history of Allergy (1980), Anxiety (03/14/2024), Chronic kidney disease (03/01/2021), Chronic pain, Chronic sinusitis, COVID-19, Depression (03/14/2004), Heart murmur, Hiatal hernia, HPV (human papilloma virus) anogenital infection, Macromastia, Migraine, Psoriatic arthritis (HCC), Shingles, and UTI (urinary tract infection). Surgical: Barbara Faulkner  has a past surgical history that includes Cholecystectomy  (2009); Tubal ligation (1997); Laparoscopic gastric banding (2008); Gastric bypass; Breast biopsy (Right, 01/28/2018); Colonoscopy with propofol  (N/A, 07/19/2018); Esophagogastroduodenoscopy (egd) with propofol  (N/A, 07/19/2018); Breast reduction surgery (Bilateral, 04/03/2020); Colonoscopy with propofol  (N/A, 09/17/2021); Colonoscopy with propofol  (N/A, 09/18/2021); Reduction mammaplasty; Total hip arthroplasty (Left, 01/23/2023); and Joint replacement (01/28/2023). Family: family history includes HIV in her father; Hepatitis in her maternal uncle; Hyperlipidemia in her mother; Hypertension in her mother; Lung cancer in her maternal grandfather; Stroke in her mother.  Laboratory Chemistry Profile   Renal Lab Results  Component Value Date   BUN 16 03/18/2024   CREATININE 0.96 03/18/2024   BCR NOT APPLICABLE 07/14/2018   GFR 66.48 03/18/2024   GFRAA >60 02/15/2014   GFRNONAA 51 (L) 05/26/2022    Hepatic Lab Results  Component Value Date   AST 30 03/18/2024   ALT 30 03/18/2024   ALBUMIN 4.3 03/18/2024   ALKPHOS 135 (H) 03/18/2024   AMYLASE 33 09/25/2014   LIPASE 124 10/04/2013    Electrolytes Lab Results  Component Value Date   NA 146 (H) 03/18/2024   K 4.9 03/18/2024   CL 111 03/18/2024   CALCIUM 8.7 03/18/2024   MG 1.9 07/14/2018   PHOS 5.0 (H) 09/25/2014    Bone Lab Results  Component Value Date   VD25OH 9.23 (L) 03/18/2024    Inflammation (CRP: Acute Phase) (ESR: Chronic Phase) Lab Results  Component Value Date   CRP <1.0 03/10/2019   ESRSEDRATE 3 03/10/2019         Note: Above Lab results reviewed.  Recent Imaging Review  MM 3D SCREENING MAMMOGRAM BILATERAL BREAST CLINICAL DATA:  Screening.  EXAM: DIGITAL SCREENING BILATERAL MAMMOGRAM WITH TOMOSYNTHESIS AND CAD  TECHNIQUE: Bilateral screening digital craniocaudal and mediolateral oblique mammograms were obtained. Bilateral screening digital breast tomosynthesis was performed. The images were evaluated  with computer-aided detection.  COMPARISON:  Previous exam(s).  ACR Breast Density Category b: There are scattered areas of fibroglandular density.  FINDINGS: There are no findings suspicious for malignancy.  IMPRESSION: No mammographic evidence of malignancy. A result letter of this screening mammogram will be mailed directly to the patient.  RECOMMENDATION: Screening mammogram in one year. (Code:SM-B-01Y)  BI-RADS CATEGORY  1: Negative.  Electronically Signed   By: Alm Parkins M.D.   On: 04/12/2024 15:15 Note: Reviewed        Physical Exam  Vitals: BP 117/84   Pulse (!) 106   Temp 98 F (36.7 C)   Resp 16   Ht  5' 3 (1.6 m)   Wt 157 lb (71.2 kg)   LMP 04/02/2014   SpO2 99%   BMI 27.81 kg/m  BMI: Estimated body mass index is 27.81 kg/m as calculated from the following:   Height as of this encounter: 5' 3 (1.6 m).   Weight as of this encounter: 157 lb (71.2 kg). Ideal: Ideal body weight: 52.4 kg (115 lb 8.3 oz) Adjusted ideal body weight: 59.9 kg (132 lb 1.8 oz) General appearance: Well nourished, well developed, and well hydrated. In no apparent acute distress Mental status: Alert, oriented x 3 (person, place, & time)       Respiratory: No evidence of acute respiratory distress Eyes: PERLA   Assessment   Diagnosis Status  1. Chronic pain syndrome   2. Lumbar pain with radiation down left leg   3. Fibromyalgia   4. Medication management   5. Primary osteoarthritis of left hip   6. Chronic musculoskeletal pain   7. Neural foraminal stenosis of lumbar spine    Controlled Controlled Controlled   Updated Problems: No problems updated.  Plan of Care  Problem-specific:  Assessment and Plan  We will continue on current medication regimen.  Prescribing drug monitoring (PDMP) reviewed; findings consistent with use of prescribed medication and no evidence of narcotic misuse or abuse.  Urine drug screening (UDS) up-to-date.  Schedule follow-up in 90 days  for medication management.  Barbara Faulkner has a current medication list which includes the following long-term medication(s): gabapentin  and quetiapine.  Pharmacotherapy (Medications Ordered): No orders of the defined types were placed in this encounter.  Orders:  No orders of the defined types were placed in this encounter.     No follow-ups on file.    Recent Visits Date Type Provider Dept  03/29/24 Office Visit Dantonio Justen K, NP [facility]  02/29/24 Procedure visit Marcelino Nurse, MD [facility]  Showing recent visits within past 90 days and meeting all other requirements Today's Visits Date Type Provider Dept  04/27/24 Office Visit Justinn Welter K, NP [facility]  Showing today's visits and meeting all other requirements Future Appointments No visits were found meeting these conditions. Showing future appointments within next 90 days and meeting all other requirements  I discussed the assessment and treatment plan with the patient. The patient was provided an opportunity to ask questions and all were answered. The patient agreed with the plan and demonstrated an understanding of the instructions.  Patient advised to call back or seek an in-person evaluation if the symptoms or condition worsens.  Duration of encounter: 30 minutes.  Total time on encounter, as per AMA guidelines included both the face-to-face and non-face-to-face time personally spent by the physician and/or other qualified health care professional(s) on the day of the encounter (includes time in activities that require the physician or other qualified health care professional and does not include time in activities normally performed by clinical staff). Physician's time may include the following activities when performed: Preparing to see the patient (e.g., pre-charting review of records, searching for previously ordered imaging, lab work, and nerve conduction tests) Review of  prior analgesic pharmacotherapies. Reviewing PMP Interpreting ordered tests (e.g., lab work, imaging, nerve conduction tests) Performing post-procedure evaluations, including interpretation of diagnostic procedures Obtaining and/or reviewing separately obtained history Performing a medically appropriate examination and/or evaluation Counseling and educating the patient/family/caregiver Ordering medications, tests, or procedures Referring and communicating with other health care professionals (when not separately reported) Documenting clinical information  in the electronic or other health record Independently interpreting results (not separately reported) and communicating results to the patient/ family/caregiver Care coordination (not separately reported)  Note by: Khiem Gargis K Kymari Lollis, NP (TTS and AI technology used. I apologize for any typographical errors that were not detected and corrected.) Date: 04/27/2024; Time: 1:29 PM

## 2024-04-27 NOTE — Progress Notes (Signed)
 Nursing Pain Medication Assessment:  Safety precautions to be maintained throughout the outpatient stay will include: orient to surroundings, keep bed in low position, maintain call bell within reach at all times, provide assistance with transfer out of bed and ambulation.  Medication Inspection Compliance: Pill count conducted under aseptic conditions, in front of the patient. Neither the pills nor the bottle was removed from the patient's sight at any time. Once count was completed pills were immediately returned to the patient in their original bottle.  Medication: Oxycodone /APAP Pill/Patch Count: 61 of 90 pills/patches remain Pill/Patch Appearance: Markings consistent with prescribed medication Bottle Appearance: Standard pharmacy container. Clearly labeled. Filled Date: 7 / 29 / 2025 Last Medication intake:  Today  Pt did not bring Tramadol  today.

## 2024-04-28 DIAGNOSIS — N390 Urinary tract infection, site not specified: Secondary | ICD-10-CM | POA: Insufficient documentation

## 2024-04-28 DIAGNOSIS — R87810 Cervical high risk human papillomavirus (HPV) DNA test positive: Secondary | ICD-10-CM | POA: Insufficient documentation

## 2024-04-28 NOTE — Assessment & Plan Note (Addendum)
 Collaborated with Dr. Dennise, nephrology in the setting of hyperparathyroidism.  He was in agreement with vitamin D  supplementation.  Plan to recheck vitamin D , calcium at follow-up.

## 2024-04-28 NOTE — Assessment & Plan Note (Signed)
 Chronic, improved from prior.  Extensive chart reviewed, alkaline phosphatase normal in 2015.  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  Pending bone density, referral to endocrinology.

## 2024-04-28 NOTE — Assessment & Plan Note (Signed)
 No systemic features.  Reviewed urine culture with patient.  Start Macrobid , probiotics

## 2024-04-28 NOTE — Assessment & Plan Note (Signed)
 Discussed Pap smear, positive HPV.  Appointment next month with GYN.  Will follow

## 2024-05-10 ENCOUNTER — Encounter: Payer: Self-pay | Admitting: Obstetrics & Gynecology

## 2024-05-10 ENCOUNTER — Other Ambulatory Visit (HOSPITAL_COMMUNITY)
Admission: RE | Admit: 2024-05-10 | Discharge: 2024-05-10 | Disposition: A | Discharge status: Home / Self Care | Source: Ambulatory Visit | Attending: Obstetrics & Gynecology | Admitting: Obstetrics & Gynecology

## 2024-05-10 ENCOUNTER — Ambulatory Visit (INDEPENDENT_AMBULATORY_CARE_PROVIDER_SITE_OTHER): Admitting: Obstetrics & Gynecology

## 2024-05-10 VITALS — BP 112/81 | HR 87 | Ht 63.0 in | Wt 164.9 lb

## 2024-05-10 DIAGNOSIS — R87610 Atypical squamous cells of undetermined significance on cytologic smear of cervix (ASC-US): Secondary | ICD-10-CM | POA: Insufficient documentation

## 2024-05-10 DIAGNOSIS — Z7689 Persons encountering health services in other specified circumstances: Secondary | ICD-10-CM

## 2024-05-10 DIAGNOSIS — N888 Other specified noninflammatory disorders of cervix uteri: Secondary | ICD-10-CM

## 2024-05-10 DIAGNOSIS — R87810 Cervical high risk human papillomavirus (HPV) DNA test positive: Secondary | ICD-10-CM | POA: Insufficient documentation

## 2024-05-10 NOTE — Progress Notes (Signed)
    GYNECOLOGY PROGRESS NOTE  Subjective:    Patient ID: Barbara Faulkner, female    DOB: [birth year], 58 y.o.   MRN: [id number]  HPI  Patient is a married 58 y.o. referred here from [facility] for a colpo due to ASCUS + HR HPV pap 04/2024. She reports a h/o an abnormal pap about 5 years ago. She had a biopsy but no further treatment. She stopped smoking about 20 years ago.   The following portions of the patient's history were reviewed and updated as appropriate: allergies, current medications, past family history, past medical history, past social history, past surgical history, and problem list.  Review of Systems Pertinent items are noted in HPI.   Objective:   Last menstrual period 04/02/2014. There is no height or weight on file to calculate BMI. Well nourished, well hydrated Black female, no apparent distress She is ambulating and conversing normally. UPT negative, consent signed, time out done Speculum placed. Cervix prepped with acetic acid. It was atrophic and somewhat stenotic. Transformation zone seen in its entirety. Colpo adequate. Colposcopic findings normal. I sprayed the cervix with Hurricaine spray and grasped the anterior lip with a tenaculum. I then dilated the cervix to accommodate an endocervical curette. ECC obtained. She tolerated the procedure well.    Assessment:   1. Establishing care with new doctor, encounter for   2. ASCUS with positive high risk HPV cervical      Plan:   1. ASCUS with positive high risk HPV cervical - await pathology - She understands that if the ECC is abnormal, then she will need a LEEP.  2. Establishing care with new doctor, encounter for (Primary)

## 2024-05-16 DIAGNOSIS — N1831 Chronic kidney disease, stage 3a: Secondary | ICD-10-CM | POA: Diagnosis not present

## 2024-05-16 DIAGNOSIS — F5105 Insomnia due to other mental disorder: Secondary | ICD-10-CM | POA: Diagnosis not present

## 2024-05-16 DIAGNOSIS — F4312 Post-traumatic stress disorder, chronic: Secondary | ICD-10-CM | POA: Diagnosis not present

## 2024-05-16 DIAGNOSIS — D509 Iron deficiency anemia, unspecified: Secondary | ICD-10-CM | POA: Diagnosis not present

## 2024-05-16 DIAGNOSIS — I10 Essential (primary) hypertension: Secondary | ICD-10-CM | POA: Diagnosis not present

## 2024-05-16 DIAGNOSIS — E559 Vitamin D deficiency, unspecified: Secondary | ICD-10-CM | POA: Diagnosis not present

## 2024-05-16 DIAGNOSIS — F419 Anxiety disorder, unspecified: Secondary | ICD-10-CM | POA: Diagnosis not present

## 2024-05-16 DIAGNOSIS — F39 Unspecified mood [affective] disorder: Secondary | ICD-10-CM | POA: Diagnosis not present

## 2024-05-16 DIAGNOSIS — R809 Proteinuria, unspecified: Secondary | ICD-10-CM | POA: Diagnosis not present

## 2024-05-16 NOTE — Addendum Note (Signed)
 Addended by: Aldo Sondgeroth on: 05/16/2024 03:37 PM   Modules accepted: Orders

## 2024-05-18 DIAGNOSIS — H168 Other keratitis: Secondary | ICD-10-CM | POA: Diagnosis not present

## 2024-05-19 DIAGNOSIS — N1831 Chronic kidney disease, stage 3a: Secondary | ICD-10-CM | POA: Diagnosis not present

## 2024-05-19 DIAGNOSIS — N2581 Secondary hyperparathyroidism of renal origin: Secondary | ICD-10-CM | POA: Diagnosis not present

## 2024-05-19 DIAGNOSIS — I129 Hypertensive chronic kidney disease with stage 1 through stage 4 chronic kidney disease, or unspecified chronic kidney disease: Secondary | ICD-10-CM | POA: Diagnosis not present

## 2024-05-19 LAB — SURGICAL PATHOLOGY

## 2024-05-20 DIAGNOSIS — H16042 Marginal corneal ulcer, left eye: Secondary | ICD-10-CM | POA: Diagnosis not present

## 2024-05-23 ENCOUNTER — Encounter: Payer: Self-pay | Admitting: Obstetrics & Gynecology

## 2024-05-31 ENCOUNTER — Other Ambulatory Visit

## 2024-05-31 ENCOUNTER — Encounter: Payer: Self-pay | Admitting: Family

## 2024-06-01 ENCOUNTER — Encounter: Admitting: Family

## 2024-06-15 ENCOUNTER — Other Ambulatory Visit (INDEPENDENT_AMBULATORY_CARE_PROVIDER_SITE_OTHER)

## 2024-06-15 DIAGNOSIS — R899 Unspecified abnormal finding in specimens from other organs, systems and tissues: Secondary | ICD-10-CM

## 2024-06-15 DIAGNOSIS — E559 Vitamin D deficiency, unspecified: Secondary | ICD-10-CM

## 2024-06-16 LAB — URINALYSIS, ROUTINE W REFLEX MICROSCOPIC
Bilirubin Urine: NEGATIVE
Ketones, ur: NEGATIVE
Leukocytes,Ua: NEGATIVE
Nitrite: NEGATIVE
Specific Gravity, Urine: 1.025 (ref 1.000–1.030)
Total Protein, Urine: NEGATIVE
Urine Glucose: NEGATIVE
Urobilinogen, UA: 0.2 (ref 0.0–1.0)
pH: 6 (ref 5.0–8.0)

## 2024-06-16 LAB — BASIC METABOLIC PANEL WITH GFR
BUN: 21 mg/dL (ref 6–23)
CO2: 24 meq/L (ref 19–32)
Calcium: 9.1 mg/dL (ref 8.4–10.5)
Chloride: 110 meq/L (ref 96–112)
Creatinine, Ser: 1.22 mg/dL — ABNORMAL HIGH (ref 0.40–1.20)
GFR: 49.78 mL/min — ABNORMAL LOW (ref >60.00)
Glucose, Bld: 79 mg/dL (ref 70–99)
Potassium: 5.1 meq/L (ref 3.5–5.1)
Sodium: 145 meq/L (ref 135–145)

## 2024-06-16 LAB — VITAMIN D 25 HYDROXY (VIT D DEFICIENCY, FRACTURES): VITD: 50.37 ng/mL (ref 30.00–100.00)

## 2024-06-23 ENCOUNTER — Telehealth: Payer: Self-pay

## 2024-06-27 NOTE — Telephone Encounter (Signed)
 The patient left me a vm saying someone called her about her last procedure. She states is was a cortisone shot. Im not sure what the question is, she has not been here since August. IF anyone called her please advise.

## 2024-06-29 ENCOUNTER — Ambulatory Visit: Payer: Self-pay | Admitting: Family

## 2024-07-05 ENCOUNTER — Ambulatory Visit
Admission: RE | Admit: 2024-07-05 | Discharge: 2024-07-05 | Disposition: A | Discharge status: Home / Self Care | Source: Ambulatory Visit | Attending: Student in an Organized Health Care Education/Training Program | Admitting: Student in an Organized Health Care Education/Training Program

## 2024-07-05 ENCOUNTER — Ambulatory Visit: Admitting: Student in an Organized Health Care Education/Training Program

## 2024-07-05 ENCOUNTER — Encounter: Payer: Self-pay | Admitting: Student in an Organized Health Care Education/Training Program

## 2024-07-05 VITALS — BP 125/89 | HR 101 | Temp 97.1°F | Resp 16 | Ht 63.0 in | Wt 160.0 lb

## 2024-07-05 DIAGNOSIS — M79672 Pain in left foot: Secondary | ICD-10-CM

## 2024-07-05 DIAGNOSIS — M25571 Pain in right ankle and joints of right foot: Secondary | ICD-10-CM | POA: Diagnosis not present

## 2024-07-05 DIAGNOSIS — M7731 Calcaneal spur, right foot: Secondary | ICD-10-CM | POA: Diagnosis not present

## 2024-07-05 DIAGNOSIS — M779 Enthesopathy, unspecified: Secondary | ICD-10-CM | POA: Diagnosis not present

## 2024-07-05 DIAGNOSIS — M722 Plantar fascial fibromatosis: Secondary | ICD-10-CM | POA: Diagnosis not present

## 2024-07-05 DIAGNOSIS — M79671 Pain in right foot: Secondary | ICD-10-CM | POA: Diagnosis not present

## 2024-07-05 MED ORDER — LIDOCAINE HCL 2 % IJ SOLN
20.0000 mL | Freq: Once | INTRAMUSCULAR | Status: AC
  Administered 2024-07-05: 100 mg
  Filled 2024-07-05: qty 20

## 2024-07-05 MED ORDER — DEXAMETHASONE SOD PHOSPHATE PF 10 MG/ML IJ SOLN
10.0000 mg | Freq: Once | INTRAMUSCULAR | Status: AC
  Administered 2024-07-05: 10 mg

## 2024-07-05 NOTE — Progress Notes (Signed)
 Safety precautions to be maintained throughout the outpatient stay will include: orient to surroundings, keep bed in low position, maintain call bell within reach at all times, provide assistance with transfer out of bed and ambulation.

## 2024-07-05 NOTE — Progress Notes (Signed)
 PROVIDER NOTE: Interpretation of information contained herein should be left to medically-trained personnel. Specific patient instructions are provided elsewhere under Patient Instructions section of medical record. This document was created in part using STT-dictation technology, any transcriptional errors that may result from this process are unintentional.  Patient: Barbara Faulkner Type: Established DOB: 04/27/1966 MRN: [id number] PCP: Dineen [name] MATSU, FNP  Service: Procedure DOS: 07/05/2024 Setting: Ambulatory Location: Ambulatory outpatient facility Delivery: Face-to-face Provider: Wallie Sherry, MD Specialty: Interventional Pain Management Specialty designation: 09 Location: Outpatient facility Ref. Prov.: Dineen [name] MATSU, FNP       Interventional Therapy     Procedure:          Anesthesia, Analgesia, Anxiolysis:  Right plantar fascia injection for right plantar fasciitis  Type: Local Anesthesia Local Anesthetic: Lidocaine  1-2% Sedation: None  Indication(s):  Analgesia Route: Infiltration ([place]/IM) IV Access: N/A   Position: Supine   1. Plantar fasciitis of right foot   2. Bilateral foot pain      NAS-11 Pain score:   Pre-procedure: 10-Worst pain ever/10   Post-procedure: 10-Worst pain ever/10     H&P (Pre-op Assessment):  Barbara Faulkner is a 58 y.o. (year old), female patient, seen today for interventional treatment. She  has a past surgical history that includes Cholecystectomy (2009); Tubal ligation (1997); Laparoscopic gastric banding (2008); Gastric bypass; Breast biopsy (Right, 01/28/2018); Colonoscopy with propofol  (N/A, 07/19/2018); Esophagogastroduodenoscopy (egd) with propofol  (N/A, 07/19/2018); Breast reduction surgery (Bilateral, 04/03/2020); Colonoscopy with propofol  (N/A, 09/17/2021); Colonoscopy with propofol  (N/A, 09/18/2021); Reduction mammaplasty; Total hip arthroplasty (Left, 01/23/2023); and Joint replacement (01/28/2023). Barbara Faulkner has a current  medication list which includes the following prescription(s): calcitriol, cholecalciferol , cyclobenzaprine , epinephrine , estradiol , gabapentin , lamotrigine , oxycodone -acetaminophen , quetiapine, and tramadol , and the following Facility-Administered Medications: lidocaine  (pf). Her primarily concern today is the Foot Pain (Right plantar fasciatis /Bilateral, additional pain in both feet )  Initial Vital Signs:  Pulse/HCG Rate: (!) 101  Temp: (!) 97.1 F (36.2 C) Resp: 16 BP: 125/89 SpO2: 98 %  BMI: Estimated body mass index is 28.34 kg/m as calculated from the following:   Height as of this encounter: 5' 3 (1.6 m).   Weight as of this encounter: 160 lb (72.6 kg).  Risk Assessment: Allergies: Reviewed. She is allergic to shellfish allergy and nsaids.  Allergy Precautions: None required Coagulopathies: Reviewed. None identified.  Blood-thinner therapy: None at this time Active Infection(s): Reviewed. None identified. Barbara Faulkner is afebrile  Site Confirmation: Barbara Faulkner was asked to confirm the procedure and laterality before marking the site Procedure checklist: Completed Consent: Before the procedure and under the influence of no sedative(s), amnesic(s), or anxiolytics, the patient was informed of the treatment options, risks and possible complications. To fulfill our ethical and legal obligations, as recommended by the American Medical Association's Code of Ethics, I have informed the patient of my clinical impression; the nature and purpose of the treatment or procedure; the risks, benefits, and possible complications of the intervention; the alternatives, including doing nothing; the risk(s) and benefit(s) of the alternative treatment(s) or procedure(s); and the risk(s) and benefit(s) of doing nothing. The patient was provided information about the general risks and possible complications associated with the procedure. These may include, but are not limited to: failure to achieve desired  goals, infection, bleeding, organ or nerve damage, allergic reactions, paralysis, and death. In addition, the patient was informed of those risks and complications associated to the procedure, such as failure to decrease pain; infection; bleeding; organ or nerve damage with subsequent  damage to sensory, motor, and/or autonomic systems, resulting in permanent pain, numbness, and/or weakness of one or several areas of the body; allergic reactions; (i.e.: anaphylactic reaction); and/or death. Furthermore, the patient was informed of those risks and complications associated with the medications. These include, but are not limited to: allergic reactions (i.e.: anaphylactic or anaphylactoid reaction(s)); adrenal axis suppression; blood sugar elevation that in diabetics may result in ketoacidosis or comma; water  retention that in patients with history of congestive heart failure may result in shortness of breath, pulmonary edema, and decompensation with resultant heart failure; weight gain; swelling or edema; medication-induced neural toxicity; particulate matter embolism and blood vessel occlusion with resultant organ, and/or nervous system infarction; and/or aseptic necrosis of one or more joints. Finally, the patient was informed that Medicine is not an exact science; therefore, there is also the possibility of unforeseen or unpredictable risks and/or possible complications that may result in a catastrophic outcome. The patient indicated having understood very clearly. We have given the patient no guarantees and we have made no promises. Enough time was given to the patient to ask questions, all of which were answered to the patient's satisfaction. Barbara Faulkner has indicated that she wanted to continue with the procedure. Attestation: I, the ordering provider, attest that I have discussed with the patient the benefits, risks, side-effects, alternatives, likelihood of achieving goals, and potential problems during  recovery for the procedure that I have provided informed consent. Date  Time: 07/05/2024 12:57 PM  Pre-Procedure Preparation:  Monitoring: As per clinic protocol. Respiration, ETCO2, SpO2, BP, heart rate and rhythm monitor placed and checked for adequate function Safety Precautions: Patient was assessed for positional comfort and pressure points before starting the procedure. Time-out: I initiated and conducted the Time-out before starting the procedure, as per protocol. The patient was asked to participate by confirming the accuracy of the Time Out information. Verification of the correct person, site, and procedure were performed and confirmed by me, the nursing staff, and the patient. Time-out conducted as per Joint Commission's Universal Protocol (UP.01.01.01). Time: 1339 Start Time: 1339 hrs.  Description of Procedure:           The patient was placed in a supine position with the right foot externally rotated for optimal access to the plantar fascia. The foot was prepped with chlorhexidine  in a sterile manner. A skin wheal was raised using 1% lidocaine  with a 27-gauge needle for local anesthesia.  A 27-gauge needle was inserted medial to the plantar fascia insertion at the calcaneus under palpation guidance. After negative aspiration, a combination of 1 mL of Decadron  10mg /cc and 5mL of 0.2% Ropivacaine   was injected into the plantar fascia. The needle was withdrawn, and hemostasis was achieved with direct pressure.   Antibiotic Prophylaxis:   Anti-infectives (From admission, onward)    None      Indication(s): None identified  Post-operative Assessment:  Post-procedure Vital Signs:  Pulse/HCG Rate: (!) 101  Temp: (!) 97.1 F (36.2 C) Resp: 16 BP: 125/89 SpO2: 98 %  EBL: None  Complications: No immediate post-treatment complications observed by team, or reported by patient.  Note: The patient tolerated the entire procedure well. A repeat set of vitals were taken  after the procedure and the patient was kept under observation following institutional policy, for this type of procedure. Post-procedural neurological assessment was performed, showing return to baseline, prior to discharge. The patient was provided with post-procedure discharge instructions, including a section on how to identify potential problems. Should any problems  arise concerning this procedure, the patient was given instructions to immediately contact us , at any time, without hesitation. In any case, we plan to contact the patient by telephone for a follow-up status report regarding this interventional procedure.  Comments:  No additional relevant information.  Plan of Care (POC)  Orders:  Orders Placed This Encounter  Procedures   DG Foot Complete Left    Standing Status:   Future    Expiration Date:   07/05/2025    Reason for Exam (SYMPTOM  OR DIAGNOSIS REQUIRED):   foot pain    Is patient pregnant?:   No    Preferred imaging location?:   [facility]   DG Foot Complete Right    Standing Status:   Future    Expiration Date:   07/05/2025    Reason for Exam (SYMPTOM  OR DIAGNOSIS REQUIRED):   foot pain    Is patient pregnant?:   No    Preferred imaging location?:   [facility]   DG Ankle Complete Left    Standing Status:   Future    Expiration Date:   07/05/2025    Reason for Exam (SYMPTOM  OR DIAGNOSIS REQUIRED):   foot pain    Is patient pregnant?:   No    Preferred imaging location?:   [facility]   DG Ankle Complete Right    Standing Status:   Future    Expiration Date:   07/05/2025    Reason for Exam (SYMPTOM  OR DIAGNOSIS REQUIRED):   pain    Is patient pregnant?:   No    Preferred imaging location?:   [facility]   Chronic Opioid Analgesic:  tramadol  100 mg ER, continue oxycodone  5 mg daily for breakthrough pain.     Medications ordered for procedure: Meds ordered this encounter  Medications   lidocaine  (XYLOCAINE ) 2 % (with pres) injection 400  mg   dexamethasone  (DECADRON ) injection 10 mg   Medications administered: We administered lidocaine  and dexamethasone .  See the medical record for exact dosing, route, and time of administration.  Follow-up plan:   Return for Keep sch. appt.      Recent Visits Date Type Provider Dept  04/27/24 Office Visit Patel, Seema K, NP [facility]  Showing recent visits within past 90 days and meeting all other requirements Today's Visits Date Type Provider Dept  07/05/24 Office Visit Marcelino Nurse, MD [facility]  Showing today's visits and meeting all other requirements Future Appointments Date Type Provider Dept  07/19/24 Appointment Patel, Seema K, NP [facility]  Showing future appointments within next 90 days and meeting all other requirements  Disposition: Discharge home  Discharge (Date  Time): 07/05/2024; 1343 hrs.   Primary Care Physician: Dineen [name] MATSU, FNP Location: [facility] Note by: Nurse Marcelino, MD (TTS technology used. I apologize for any typographical errors that were not detected and corrected.) Date: 07/05/2024; Time: 1:44 PM  Disclaimer:  Medicine is not an visual merchandiser. The only guarantee in medicine is that nothing is guaranteed. It is important to note that the decision to proceed with this intervention was based on the information collected from the patient. The Data and conclusions were drawn from the patient's questionnaire, the interview, and the physical examination. Because the information was provided in large part by the patient, it cannot be guaranteed that it has not been purposely or unconsciously manipulated. Every effort has been made to obtain as much relevant data as possible  for this evaluation. It is important to note that the conclusions that lead to this procedure are derived in large part from the available data. Always take into account that the treatment will also be dependent on  availability of resources and existing treatment guidelines, considered by other Pain Management Practitioners as being common knowledge and practice, at the time of the intervention. For Medico-Legal purposes, it is also important to point out that variation in procedural techniques and pharmacological choices are the acceptable norm. The indications, contraindications, technique, and results of the above procedure should only be interpreted and judged by a Board-Certified Interventional Pain Specialist with extensive familiarity and expertise in the same exact procedure and technique.

## 2024-07-05 NOTE — Patient Instructions (Signed)

## 2024-07-11 ENCOUNTER — Ambulatory Visit: Payer: Self-pay | Admitting: Student in an Organized Health Care Education/Training Program

## 2024-07-19 ENCOUNTER — Encounter: Admitting: Nurse Practitioner

## 2024-09-02 ENCOUNTER — Ambulatory Visit: Payer: Self-pay

## 2024-09-02 ENCOUNTER — Telehealth: Admitting: Physician Assistant

## 2024-09-02 DIAGNOSIS — J019 Acute sinusitis, unspecified: Secondary | ICD-10-CM

## 2024-09-02 DIAGNOSIS — B9689 Other specified bacterial agents as the cause of diseases classified elsewhere: Secondary | ICD-10-CM

## 2024-09-02 DIAGNOSIS — R051 Acute cough: Secondary | ICD-10-CM | POA: Diagnosis not present

## 2024-09-02 MED ORDER — PROMETHAZINE-DM 6.25-15 MG/5ML PO SYRP: 5.0000 mL | ORAL_SOLUTION | Freq: Four times a day (QID) | ORAL | 0 refills | Status: AC | PRN

## 2024-09-02 MED ORDER — AMOXICILLIN-POT CLAVULANATE 875-125 MG PO TABS: 1.0000 | ORAL_TABLET | Freq: Two times a day (BID) | ORAL | 0 refills | Status: DC

## 2024-09-02 NOTE — Progress Notes (Signed)
 " Virtual Visit Consent   Barbara Faulkner, you are scheduled for a virtual visit with a  provider today. Just as with appointments in the office, your consent must be obtained to participate. Your consent will be active for this visit and any virtual visit you may have with one of our providers in the next 365 days. If you have a MyChart account, a copy of this consent can be sent to you electronically.  As this is a virtual visit, video technology does not allow for your provider to perform a traditional examination. This may limit your provider's ability to fully assess your condition. If your provider identifies any concerns that need to be evaluated in person or the need to arrange testing (such as labs, EKG, etc.), we will make arrangements to do so. Although advances in technology are sophisticated, we cannot ensure that it will always work on either your end or our end. If the connection with a video visit is poor, the visit may have to be switched to a telephone visit. With either a video or telephone visit, we are not always able to ensure that we have a secure connection.  By engaging in this virtual visit, you consent to the provision of healthcare and authorize for your insurance to be billed (if applicable) for the services provided during this visit. Depending on your insurance coverage, you may receive a charge related to this service.  I need to obtain your verbal consent now. Are you willing to proceed with your visit today? Barbara Faulkner has provided verbal consent on 09/02/2024 for a virtual visit (video or telephone). Delon CHRISTELLA Dickinson, PA-C  Date: 09/02/2024 7:26 PM   Virtual Visit via Video Note   I, Delon CHRISTELLA Dickinson, connected with  Barbara Faulkner  ([id number], 1966-01-26) on 09/02/2024 at  7:30 PM EST by a video-enabled telemedicine application and verified that I am speaking with the correct person using two identifiers.  Location: Patient: Virtual Visit Location  Patient: Home Provider: Virtual Visit Location Provider: Home Office   I discussed the limitations of evaluation and management by telemedicine and the availability of in person appointments. The patient expressed understanding and agreed to proceed.    History of Present Illness: Barbara Faulkner is a 59 y.o. who identifies as a female who was assigned female at birth, and is being seen today for sinus pain.  HPI: Sinus Problem This is a new problem. The current episode started in the past 7 days. There has been no fever. The pain is moderate. Associated symptoms include chills, congestion, coughing, headaches, sinus pressure (rhinorrhea and post nasal drainage) and a sore throat. Pertinent negatives include no ear pain, hoarse voice or shortness of breath. Treatments tried: theraflu. The treatment provided no relief.    Problems:  Patient Active Problem List   Diagnosis Date Noted   Pap smear of cervix shows high risk HPV present 04/28/2024   UTI (urinary tract infection) 04/28/2024   Arthritis 03/18/2024   Dysphagia 03/18/2024   Nausea and vomiting 03/18/2024   Psoriasis 03/18/2024   Arthralgia 03/18/2024   Polyarthralgia 03/18/2024   Morbid obesity (HCC) 03/18/2024   Vaginal dryness, menopausal 03/18/2024   Abnormal liver enzymes 10/27/2023   Chronic pain syndrome 10/01/2023   History of left hip replacement 06/25/2023   Chronic pain of left knee 03/13/2023   History of total hip arthroplasty 02/04/2023   Encounter for other preprocedural examination 01/22/2023   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) 10/21/2022   Lumbar radicular pain 10/01/2022   Fibromyalgia 10/01/2022   Normocytic anemia 06/13/2022   Elevated alkaline phosphatase level 05/28/2022   Chills 05/26/2022   Chronic left SI joint pain 11/13/2021   Piriformis syndrome, left 11/13/2021   Greater 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 11/13/2021   Primary osteoarthritis of right hip  09/16/2021   Plantar fasciitis of right foot 08/22/2021   Carpal tunnel syndrome on right 08/22/2021   Primary osteoarthritis of left hip 08/15/2021   Hyperphosphatemia 08/01/2021   Anemia in chronic kidney disease 04/18/2021   Chronic kidney disease, stage 3a (HCC) 04/18/2021   Hyperparathyroidism due to renal insufficiency 04/18/2021   Hypertension 04/18/2021   Hiatal hernia 11/02/2020   History of migraine 11/02/2020   Gastroesophageal reflux disease 11/02/2020   Brain fog 11/02/2020   Depression, recurrent 03/18/2020   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 09/08/2018   Arthropathy of right shoulder 09/08/2018   Controlled substance agreement signed 09/08/2018   Chronic musculoskeletal pain 08/24/2018   Routine physical examination 08/24/2018   Iron deficiency anemia, unspecified 08/24/2018   Elevated liver enzymes 07/19/2018   Vitamin D  deficiency 07/19/2018   Colon cancer screening    Abdominal pain, epigastric 07/08/2018   Right shoulder pain 06/15/2018   Hemorrhoids 05/01/2017   Anxiety and depression 11/24/2016   Lumbar pain with radiation down left leg 09/11/2016   Large breasts 09/11/2016   Parotiditis 05/01/2016   Abnormal laboratory test result 03/26/2016   Proteinuria 03/26/2016   Right hip pain 04/18/2015   Sinusitis 01/08/2014   Psoriatic arthritis (HCC) 09/13/2013   DUB (dysfunctional uterine bleeding) 10/27/2012   Gastritis due to nonsteroidal anti-inflammatory drug 12/12/2011   Anaphylactic reaction due to shellfish 09/27/2010   Goiter 09/03/2010   Allergic rhinitis 03/15/2010   Migraine with aura 05/17/2008    Allergies: Allergies[1] Medications: Current Medications[2]  Observations/Objective: Patient is well-developed, well-nourished in no acute distress.  Resting comfortably at home.  Head is normocephalic, atraumatic.  No labored breathing.  Speech is clear and coherent with logical content.  Patient is alert and oriented at baseline.    Assessment  and Plan: 1. Acute bacterial sinusitis (Primary) - amoxicillin -clavulanate (AUGMENTIN ) 875-125 MG tablet; Take 1 tablet by mouth 2 (two) times daily.  Dispense: 14 tablet; Refill: 0  2. Acute cough - promethazine -dextromethorphan (PROMETHAZINE -DM) 6.25-15 MG/5ML syrup; Take 5 mLs by mouth 4 (four) times daily as needed.  Dispense: 118 mL; Refill: 0  - Worsening symptoms that have not responded to OTC medications.  - Will give Augmentin  - Promethazine  DM added for cough - Continue allergy medications.  - Steam and humidifier can help - Stay well hydrated and get plenty of rest.  - Seek in person evaluation if no symptom improvement or if symptoms worsen   Follow Up Instructions: I discussed the assessment and treatment plan with the patient. The patient was provided an opportunity to ask questions and all were answered. The patient agreed with the plan and demonstrated an understanding of the instructions.  A copy of instructions were sent to the patient via MyChart unless otherwise noted below.    The patient was advised to call back or seek an in-person evaluation if the symptoms worsen or if the condition fails to improve as anticipated.    Delon CHRISTELLA Dickinson, PA-C     [1]  Allergies Allergen Reactions   Shellfish Allergy Swelling and Anaphylaxis    REACTION: throat closing   Nsaids  All NSAIDS -h/o CKD 2-3   [2]  Current Outpatient Medications:    amoxicillin -clavulanate (AUGMENTIN ) 875-125 MG tablet, Take 1 tablet by mouth 2 (two) times daily., Disp: 14 tablet, Rfl: 0   promethazine -dextromethorphan (PROMETHAZINE -DM) 6.25-15 MG/5ML syrup, Take 5 mLs by mouth 4 (four) times daily as needed., Disp: 118 mL, Rfl: 0   calcitRIOL (ROCALTROL) 0.5 MCG capsule, Take 0.5 mcg by mouth., Disp: , Rfl:    Cholecalciferol  1.25 MG (50000 UT) TABS, 50,000 units PO qwk for 8 weeks., Disp: 8 tablet, Rfl: 0   cyclobenzaprine  (FLEXERIL ) 5 MG tablet, Take 1 tablet (5 mg total) by mouth at  bedtime as needed for muscle spasms., Disp: 90 tablet, Rfl: 1   EPINEPHrine  0.3 mg/0.3 mL IJ SOAJ injection, Inject into the muscle., Disp: , Rfl:    estradiol  (ESTRACE ) 0.1 MG/GM vaginal cream, 0.5 g intravaginally 1-3 times per week., Disp: 42.5 g, Rfl: 2   gabapentin  (NEURONTIN ) 600 MG tablet, Take 1 tablet (600 mg total) by mouth at bedtime., Disp: 90 tablet, Rfl: 1   lamoTRIgine  (LAMICTAL ) 150 MG tablet, Take 150 mg by mouth daily., Disp: , Rfl:    QUEtiapine (SEROQUEL) 100 MG tablet, Take 100 mg by mouth at bedtime., Disp: , Rfl:    traMADol  (ULTRAM -ER) 200 MG 24 hr tablet, Take 1 tablet (200 mg total) by mouth daily., Disp: 30 tablet, Rfl: 2  Current Facility-Administered Medications:    lidocaine  (PF) (XYLOCAINE ) 1 % injection 2 mL, 2 mL, Intradermal, Once, Alvia, Jason J, MD  "

## 2024-09-02 NOTE — Telephone Encounter (Signed)
 FYI Only or Action Required?: Action required by provider: request for appointment.  Patient was last seen in primary care on 04/26/2024 by Dineen [name] MATSU, FNP.  Called Nurse Triage reporting Facial Pain.  Symptoms began today.  Interventions attempted: Nothing.  Symptoms are: gradually worsening.Sinus pain, pressure. Face, teeth pain. Green mucus.  Triage Disposition: See HCP Within 4 Hours (Or PCP Triage)  Patient/caregiver understands and will follow disposition?: Yes      Copied from CRM #[id number]. Topic: Clinical - Red Word Triage >> Sep 02, 2024 10:34 AM Barbara Faulkner ORN wrote: Red Word that prompted transfer to Nurse Triage: pt believes she has a sinus infection. Stated this morning had green mucus and teeth are hurting with a headache , otc not working Reason for Disposition  [1] SEVERE sinus pain (e.g., excruciating) AND [2] not improved 2 hours after pain medicine  Answer Assessment - Initial Assessment Questions 1. LOCATION: Where does it hurt?      Face, teeth 2. ONSET: When did the sinus pain start?  (e.g., hours, days)      today 3. SEVERITY: How bad is the pain?   (Scale 0-10; or none, mild, moderate or severe)     severe 4. RECURRENT SYMPTOM: Have you ever had sinus problems before? If Yes, ask: When was the last time? and What happened that time?      yes 5. NASAL CONGESTION: Is the nose blocked? If Yes, ask: Can you open it or must you breathe through your mouth?     no 6. NASAL DISCHARGE: Do you have discharge from your nose? If so ask, What color?     Green 7. FEVER: Do you have a fever? If Yes, ask: What is it, how was it measured, and when did it start?      no 8. OTHER SYMPTOMS: Do you have any other symptoms? (e.g., sore throat, cough, earache, difficulty breathing)     Cough, sore throat 9. PREGNANCY: Is there any chance you are pregnant? When was your last menstrual period?     no  Protocols used: Sinus Pain or  Congestion-A-AH

## 2024-09-02 NOTE — Patient Instructions (Signed)
 " Olam FORBES Plunk, thank you for joining Delon CHRISTELLA Dickinson, PA-C for today's virtual visit.  While this provider is not your primary care provider (PCP), if your PCP is located in our provider database this encounter information will be shared with them immediately following your visit.   A [facility] MyChart account gives you access to today's visit and all your visits, tests, and labs performed at [facility]  click here if you don't have a [facility] MyChart account or go to [email or web address]  Consent: (Patient) Barbara Faulkner provided verbal consent for this virtual visit at the beginning of the encounter.  Current Medications:  Current Outpatient Medications:    amoxicillin -clavulanate (AUGMENTIN ) 875-125 MG tablet, Take 1 tablet by mouth 2 (two) times daily., Disp: 14 tablet, Rfl: 0   promethazine -dextromethorphan (PROMETHAZINE -DM) 6.25-15 MG/5ML syrup, Take 5 mLs by mouth 4 (four) times daily as needed., Disp: 118 mL, Rfl: 0   calcitRIOL (ROCALTROL) 0.5 MCG capsule, Take 0.5 mcg by mouth., Disp: , Rfl:    Cholecalciferol  1.25 MG (50000 UT) TABS, 50,000 units PO qwk for 8 weeks., Disp: 8 tablet, Rfl: 0   cyclobenzaprine  (FLEXERIL ) 5 MG tablet, Take 1 tablet (5 mg total) by mouth at bedtime as needed for muscle spasms., Disp: 90 tablet, Rfl: 1   EPINEPHrine  0.3 mg/0.3 mL IJ SOAJ injection, Inject into the muscle., Disp: , Rfl:    estradiol  (ESTRACE ) 0.1 MG/GM vaginal cream, 0.5 g intravaginally 1-3 times per week., Disp: 42.5 g, Rfl: 2   gabapentin  (NEURONTIN ) 600 MG tablet, Take 1 tablet (600 mg total) by mouth at bedtime., Disp: 90 tablet, Rfl: 1   lamoTRIgine  (LAMICTAL ) 150 MG tablet, Take 150 mg by mouth daily., Disp: , Rfl:    QUEtiapine (SEROQUEL) 100 MG tablet, Take 100 mg by mouth at bedtime., Disp: , Rfl:    traMADol  (ULTRAM -ER) 200 MG 24 hr tablet, Take 1 tablet (200 mg total) by mouth daily., Disp: 30 tablet, Rfl: 2  Current Facility-Administered  Medications:    lidocaine  (PF) (XYLOCAINE ) 1 % injection 2 mL, 2 mL, Intradermal, Once, Alvia, Jason J, MD   Medications ordered in this encounter:  Meds ordered this encounter  Medications   amoxicillin -clavulanate (AUGMENTIN ) 875-125 MG tablet    Sig: Take 1 tablet by mouth 2 (two) times daily.    Dispense:  14 tablet    Refill:  0    Supervising Provider:   LAMPTEY, PHILIP O [[id number]]   promethazine -dextromethorphan (PROMETHAZINE -DM) 6.25-15 MG/5ML syrup    Sig: Take 5 mLs by mouth 4 (four) times daily as needed.    Dispense:  118 mL    Refill:  0    Supervising Provider:   BLAISE ALEENE KIDD [[id number]]     *If you need refills on other medications prior to your next appointment, please contact your pharmacy*  Follow-Up: Call back or seek an in-person evaluation if the symptoms worsen or if the condition fails to improve as anticipated.  [facility] [phone]  Other Instructions Sinus Infection, Adult A sinus infection, also called sinusitis, is inflammation of your sinuses. Sinuses are hollow spaces in the bones around your face. Your sinuses are located: Around your eyes. In the middle of your forehead. Behind your nose. In your cheekbones. Mucus normally drains out of your sinuses. When your nasal tissues become inflamed or swollen, mucus can become trapped or blocked. This allows bacteria, viruses, and fungi to grow, which leads to infection. Most infections of the sinuses  are caused by a virus. A sinus infection can develop quickly. It can last for up to 4 weeks (acute) or for more than 12 weeks (chronic). A sinus infection often develops after a cold. What are the causes? This condition is caused by anything that creates swelling in the sinuses or stops mucus from draining. This includes: Allergies. Asthma. Infection from bacteria or viruses. Deformities or blockages in your nose or sinuses. Abnormal growths in the nose (nasal polyps). Pollutants,  such as chemicals or irritants in the air. Infection from fungi. This is rare. What increases the risk? You are more likely to develop this condition if you: Have a weak body defense system (immune system). Do a lot of swimming or diving. Overuse nasal sprays. Smoke. What are the signs or symptoms? The main symptoms of this condition are pain and a feeling of pressure around the affected sinuses. Other symptoms include: Stuffy nose or congestion that makes it difficult to breathe through your nose. Thick yellow or greenish drainage from your nose. Tenderness, swelling, and warmth over the affected sinuses. A cough that may get worse at night. Decreased sense of smell and taste. Extra mucus that collects in the throat or the back of the nose (postnasal drip) causing a sore throat or bad breath. Tiredness (fatigue). Fever. How is this diagnosed? This condition is diagnosed based on: Your symptoms. Your medical history. A physical exam. Tests to find out if your condition is acute or chronic. This may include: Checking your nose for nasal polyps. Viewing your sinuses using a device that has a light (endoscope). Testing for allergies or bacteria. Imaging tests, such as an MRI or CT scan. In rare cases, a bone biopsy may be done to rule out more serious types of fungal sinus disease. How is this treated? Treatment for a sinus infection depends on the cause and whether your condition is chronic or acute. If caused by a virus, your symptoms should go away on their own within 10 days. You may be given medicines to relieve symptoms. They include: Medicines that shrink swollen nasal passages (decongestants). A spray that eases inflammation of the nostrils (topical intranasal corticosteroids). Rinses that help get rid of thick mucus in your nose (nasal saline washes). Medicines that treat allergies (antihistamines). Over-the-counter pain relievers. If caused by bacteria, your health care  provider may recommend waiting to see if your symptoms improve. Most bacterial infections will get better without antibiotic medicine. You may be given antibiotics if you have: A severe infection. A weak immune system. If caused by narrow nasal passages or nasal polyps, surgery may be needed. Follow these instructions at home: Medicines Take, use, or apply over-the-counter and prescription medicines only as told by your health care provider. These may include nasal sprays. If you were prescribed an antibiotic medicine, take it as told by your health care provider. Do not stop taking the antibiotic even if you start to feel better. Hydrate and humidify  Drink enough fluid to keep your urine pale yellow. Staying hydrated will help to thin your mucus. Use a cool mist humidifier to keep the humidity level in your home above 50%. Inhale steam for 10-15 minutes, 3-4 times a day, or as told by your health care provider. You can do this in the bathroom while a hot shower is running. Limit your exposure to cool or dry air. Rest Rest as much as possible. Sleep with your head raised (elevated). Make sure you get enough sleep each night. General instructions  Apply a warm, moist washcloth to your face 3-4 times a day or as told by your health care provider. This will help with discomfort. Use nasal saline washes as often as told by your health care provider. Wash your hands often with soap and water  to reduce your exposure to germs. If soap and water  are not available, use hand sanitizer. Do not smoke. Avoid being around people who are smoking (secondhand smoke). Keep all follow-up visits. This is important. Contact a health care provider if: You have a fever. Your symptoms get worse. Your symptoms do not improve within 10 days. Get help right away if: You have a severe headache. You have persistent vomiting. You have severe pain or swelling around your face or eyes. You have vision  problems. You develop confusion. Your neck is stiff. You have trouble breathing. These symptoms may be an emergency. Get help right away. Call 911. Do not wait to see if the symptoms will go away. Do not drive yourself to the hospital. Summary A sinus infection is soreness and inflammation of your sinuses. Sinuses are hollow spaces in the bones around your face. This condition is caused by nasal tissues that become inflamed or swollen. The swelling traps or blocks the flow of mucus. This allows bacteria, viruses, and fungi to grow, which leads to infection. If you were prescribed an antibiotic medicine, take it as told by your health care provider. Do not stop taking the antibiotic even if you start to feel better. Keep all follow-up visits. This is important. This information is not intended to replace advice given to you by your health care provider. Make sure you discuss any questions you have with your health care provider. Document Revised: 07/23/2021 Document Reviewed: 07/23/2021 Elsevier Patient Education  2024 [employer]   If you have been instructed to have an in-person evaluation today at a local Urgent Care facility, please use the link below. It will take you to a list of all of our available [facility], including address, phone number and hours of operation. Please do not delay care.  [facility]  If you or a family member do not have a primary care provider, use the link below to schedule a visit and establish care. When you choose a [facility] primary care physician or advanced practice provider, you gain a long-term partner in health. Find a Primary Care Provider  Learn more about [facility] in-office and virtual care options: [facility] - Get Care Now "

## 2024-09-08 ENCOUNTER — Ambulatory Visit: Admitting: Family

## 2024-09-08 ENCOUNTER — Telehealth: Payer: Self-pay | Admitting: Family

## 2024-09-08 NOTE — Telephone Encounter (Signed)
 Copied from CRM #[id number]. Topic: General - Other >> Sep 08, 2024  1:41 PM Alexandria E wrote:  Reason for CRM: FYI - Patient just wanted to relay to Jenate that she got scheduled with endocrinology for April 16th, patient will callback to make an appointment with PCP as she wants to wait a bit to get this one scheduled.

## 2024-09-08 NOTE — Telephone Encounter (Signed)
 Call patient Please schedule appointment with me to discuss endocrinology referral that I placed.  She has politely declined at this time.  The reasons that I have placed endocrinology referral are below.  Also, please advise patient to schedule bone density.  I have ordered this  Elevated alk phos lab GGT normal, concern for bone etiology  H/o duodenal switch Pending DEXA scan

## 2024-09-08 NOTE — Telephone Encounter (Signed)
 Spoke to pt she will call and reschedule her appt with Endocrine and then she will schedule an appt with you after she sees them

## 2024-09-08 NOTE — Telephone Encounter (Signed)
 Noted

## 2024-09-20 ENCOUNTER — Other Ambulatory Visit: Payer: Self-pay | Admitting: Nurse Practitioner

## 2024-09-20 DIAGNOSIS — M7918 Myalgia, other site: Secondary | ICD-10-CM

## 2024-09-20 DIAGNOSIS — M48061 Spinal stenosis, lumbar region without neurogenic claudication: Secondary | ICD-10-CM

## 2024-09-20 DIAGNOSIS — M1612 Unilateral primary osteoarthritis, left hip: Secondary | ICD-10-CM

## 2024-09-20 DIAGNOSIS — G894 Chronic pain syndrome: Secondary | ICD-10-CM

## 2024-09-27 ENCOUNTER — Ambulatory Visit: Admitting: Nurse Practitioner

## 2024-09-27 ENCOUNTER — Encounter: Payer: Self-pay | Admitting: Nurse Practitioner

## 2024-09-27 VITALS — BP 119/83 | HR 96 | Temp 97.2°F | Resp 18 | Ht 63.0 in | Wt 160.0 lb

## 2024-09-27 DIAGNOSIS — M797 Fibromyalgia: Secondary | ICD-10-CM | POA: Insufficient documentation

## 2024-09-27 DIAGNOSIS — G894 Chronic pain syndrome: Secondary | ICD-10-CM | POA: Insufficient documentation

## 2024-09-27 DIAGNOSIS — M722 Plantar fascial fibromatosis: Secondary | ICD-10-CM | POA: Insufficient documentation

## 2024-09-27 DIAGNOSIS — G8929 Other chronic pain: Secondary | ICD-10-CM | POA: Insufficient documentation

## 2024-09-27 DIAGNOSIS — M1712 Unilateral primary osteoarthritis, left knee: Secondary | ICD-10-CM | POA: Diagnosis present

## 2024-09-27 DIAGNOSIS — M1612 Unilateral primary osteoarthritis, left hip: Secondary | ICD-10-CM | POA: Diagnosis present

## 2024-09-27 DIAGNOSIS — M7918 Myalgia, other site: Secondary | ICD-10-CM | POA: Insufficient documentation

## 2024-09-27 DIAGNOSIS — M79671 Pain in right foot: Secondary | ICD-10-CM | POA: Diagnosis present

## 2024-09-27 DIAGNOSIS — M79672 Pain in left foot: Secondary | ICD-10-CM | POA: Insufficient documentation

## 2024-09-27 DIAGNOSIS — M545 Low back pain, unspecified: Secondary | ICD-10-CM | POA: Diagnosis present

## 2024-09-27 DIAGNOSIS — L4050 Arthropathic psoriasis, unspecified: Secondary | ICD-10-CM | POA: Diagnosis present

## 2024-09-27 DIAGNOSIS — M5416 Radiculopathy, lumbar region: Secondary | ICD-10-CM | POA: Diagnosis present

## 2024-09-27 DIAGNOSIS — M79605 Pain in left leg: Secondary | ICD-10-CM | POA: Insufficient documentation

## 2024-09-27 DIAGNOSIS — M48061 Spinal stenosis, lumbar region without neurogenic claudication: Secondary | ICD-10-CM | POA: Insufficient documentation

## 2024-09-27 DIAGNOSIS — Z79899 Other long term (current) drug therapy: Secondary | ICD-10-CM | POA: Diagnosis present

## 2024-09-27 MED ORDER — OXYCODONE-ACETAMINOPHEN 5-325 MG PO TABS: 1.0000 | ORAL_TABLET | Freq: Three times a day (TID) | ORAL | 0 refills | Status: AC | PRN

## 2024-09-27 MED ORDER — GABAPENTIN 600 MG PO TABS: 600.0000 mg | ORAL_TABLET | Freq: Every day | ORAL | 1 refills | Status: AC

## 2024-09-27 MED ORDER — CYCLOBENZAPRINE HCL 5 MG PO TABS: 5.0000 mg | ORAL_TABLET | Freq: Every evening | ORAL | 1 refills | Status: AC | PRN

## 2024-09-27 MED ORDER — TRAMADOL HCL ER 200 MG PO TB24: 200.0000 mg | ORAL_TABLET | Freq: Every day | ORAL | 2 refills | Status: AC

## 2024-09-27 NOTE — Progress Notes (Signed)
 Nursing Pain Medication Assessment:  Safety precautions to be maintained throughout the outpatient stay will include: orient to surroundings, keep bed in low position, maintain call bell within reach at all times, provide assistance with transfer out of bed and ambulation.  Medication Inspection Compliance: Pill count conducted under aseptic conditions, in front of the patient. Neither the pills nor the bottle was removed from the patient's sight at any time. Once count was completed pills were immediately returned to the patient in their original bottle.  Medication: Tramadol  (Ultram ) Pill/Patch Count: 13 of 30 pills/patches remain Pill/Patch Appearance: Markings consistent with prescribed medication Bottle Appearance: Standard pharmacy container. Clearly labeled. Filled Date: 08 / 25 / 2025 Last Medication intake:  TodaySafety precautions to be maintained throughout the outpatient stay will include: orient to surroundings, keep bed in low position, maintain call bell within reach at all times, provide assistance with transfer out of bed and ambulation. Oxycodone /acet 10/90 Filled 07/26/24 Last took today

## 2024-09-27 NOTE — Progress Notes (Signed)
 PROVIDER NOTE: Interpretation of information contained herein should be left to medically-trained personnel. Specific patient instructions are provided elsewhere under Patient Instructions section of medical record. This document was created in part using AI and STT-dictation technology, any transcriptional errors that may result from this process are unintentional.  Patient: Barbara Faulkner  Service: E/M   PCP: Dineen [name] MATSU, FNP  DOB: May 08, 1966  DOS: 09/27/2024  Provider: Emmy MARLA Blanch, NP  MRN: [id number]  Delivery: Face-to-face  Specialty: Interventional Pain Management  Type: Established Patient  Setting: Ambulatory outpatient facility  Specialty designation: 09  Referring Prov.: Dineen [name] MATSU, FNP  Location: Outpatient office facility       History of present illness (HPI) [name], a 59 y.o. year old female, is here today because of her Plantar fasciitis of right foot [M72.2], left hip pain and left knee pain.  [name] primary complain today is Hip Pain (left) and Knee Pain (left) and bilateral foot pain.   Pertinent problems: 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  Pain Assessment: Severity of Chronic pain is reported as a 7 /10. Location: Hip Left/ . Onset: More than a month ago. Quality: Sharp. Timing: Constant. Modifying factor(s): meds. Vitals:  height is 5' 3 (1.6 m) and weight is 160 lb (72.6 kg). Her temperature is 97.2 F (36.2 C) (abnormal). Her blood pressure is 119/83 and her pulse is 96. Her respiration is 18 and oxygen saturation is 99%.  BMI: Estimated body mass index is 28.34 kg/m as calculated from the following:   Height as of this encounter: 5' 3 (1.6 m).   Weight as of this  encounter: 160 lb (72.6 kg).  Last encounter: 04/27/2024. Last procedure: Visit date not found.  Reason for encounter: medication management. No change in medical history since last visit.  Patient's pain is at baseline.  Patient continues multimodal pain regimen as prescribed.  States that it provides pain relief and improvement in functional status.   Discussed the use of AI scribe software for clinical note transcription with the patient, who gave verbal consent to proceed.  History of Present Illness   Barbara Faulkner is a 59 year old female who presents with bilateral foot pain and right ankle calcaneal spur.  She experiences persistent bilateral foot pain, particularly when getting up after lying down, which makes walking difficult and causes limping. The pain is described as a constant ache, distinct from her known plantar fasciitis pain. No sciatic or lumbar pain is present. No numbness, tingling, or burning sensations in her feet and no restless leg syndrome. We discussed to order EMG/NCV study to exclude neuropathy vs lumbar radiculopathy.   She has a history of right ankle calcaneal spur and a small plantar calcaneal spur on the right foot. Cortisone shots have provided temporary relief in the past. She uses a roller with spikes and a ball to massage her feet, which offers some relief. Her occupation requires standing on cement all day, exacerbating her pain.  Her current medications include Percocet, tramadol , gabapentin , and Flexeril .  She has run out of Flexeril  and experiences leg cramps when not taking it. No side effects from her medications are reported.  She has a history of hip replacement and reports increased hip pain since  the weather has turned cold. Her pain level today is a 7/10, attributed to prolonged standing.     Pharmacotherapy Assessment   Analgesic: Oxycodone -acetaminophen  (Percocet) 5-325 mg tablet every 8 hours as needed for pain. MME=22.50 Gabapentin  600 mg  tablet daily at bedtime Tramadol  ER 200 Mg daily MME=40 Monitoring: [place] PMP: PDMP reviewed during this encounter.       Pharmacotherapy: No side-effects or adverse reactions reported. Compliance: No problems identified. Effectiveness: Clinically acceptable.  Barbara Wolm HERO, RN  09/27/2024  8:22 AM  Sign when Signing Visit Nursing Pain Medication Assessment:  Safety precautions to be maintained throughout the outpatient stay will include: orient to surroundings, keep bed in low position, maintain call bell within reach at all times, provide assistance with transfer out of bed and ambulation.  Medication Inspection Compliance: Pill count conducted under aseptic conditions, in front of the patient. Neither the pills nor the bottle was removed from the patient's sight at any time. Once count was completed pills were immediately returned to the patient in their original bottle.  Medication: Tramadol  (Ultram ) Pill/Patch Count: 13 of 30 pills/patches remain Pill/Patch Appearance: Markings consistent with prescribed medication Bottle Appearance: Standard pharmacy container. Clearly labeled. Filled Date: 08 / 25 / 2025 Last Medication intake:  TodaySafety precautions to be maintained throughout the outpatient stay will include: orient to surroundings, keep bed in low position, maintain call bell within reach at all times, provide assistance with transfer out of bed and ambulation. Oxycodone /acet 10/90 Filled 07/26/24 Last took today    UDS:  Summary  Date Value Ref Range Status  10/01/2022 Note  Final    Comment:    ==================================================================== [employer] 13 (MW) ==================================================================== Test                             Result       Flag       Units  Drug Present and Declared for Prescription Verification   Oxycodone                       293          EXPECTED   ng/mg creat   Oxymorphone                     161          EXPECTED   ng/mg creat   Noroxycodone                   707          EXPECTED   ng/mg creat   Noroxymorphone                 144          EXPECTED   ng/mg creat    Sources of oxycodone  are scheduled prescription medications.    Oxymorphone, noroxycodone, and noroxymorphone are expected    metabolites of oxycodone . Oxymorphone is also available as a    scheduled prescription medication.    Tramadol                        >3937        EXPECTED   ng/mg creat   O-Desmethyltramadol            >3937        EXPECTED   ng/mg creat   N-Desmethyltramadol            >  3937        EXPECTED   ng/mg creat    Source of tramadol  is a prescription medication. O-desmethyltramadol    and N-desmethyltramadol are expected metabolites of tramadol .  ==================================================================== Test                      Result    Flag   Units      Ref Range   Creatinine              127              mg/dL      >=79 ==================================================================== Declared Medications:  The flagging and interpretation on this report are based on the  following declared medications.  Unexpected results may arise from  inaccuracies in the declared medications.   **Note: The testing scope of this panel includes these medications:   Oxycodone  (Percocet)  Tramadol  (Ultram )   **Note: The testing scope of this panel does not include the  following reported medications:   Acetaminophen  (Percocet)  Calcitriol  Cholecalciferol   Cyclobenzaprine  (Flexeril )  Duloxetine  (Cymbalta )  Gabapentin  (Neurontin )  Lamotrigine  (Lamictal )  Pantoprazole  (Protonix )  Quetiapine (Seroquel)  Vitamin D  ==================================================================== For clinical consultation, please call [phone]. ====================================================================     No results found for: CBDTHCR No results found for: D8THCCBX No  results found for: D9THCCBX  ROS  Constitutional: Denies any fever or chills Gastrointestinal: No reported hemesis, hematochezia, vomiting, or acute GI distress Musculoskeletal: Hip Pain (left) and Knee Pain (left), bilateral foot pain, right ankle pain Neurological: No reported episodes of acute onset apraxia, aphasia, dysarthria, agnosia, amnesia, paralysis, loss of coordination, or loss of consciousness  Medication Review  Cholecalciferol , EPINEPHrine , QUEtiapine, amoxicillin -clavulanate, calcitRIOL, cyclobenzaprine , estradiol , gabapentin , lamoTRIgine , oxyCODONE -acetaminophen , promethazine -dextromethorphan, and traMADol   History Review  Allergy: [name] is allergic to shellfish allergy and nsaids. Drug: [name]  reports no history of drug use. Alcohol:  reports that she does not currently use alcohol. Tobacco:  reports that she quit smoking about 21 years ago. Her smoking use included cigarettes. She started smoking about 41 years ago. She has a 40 pack-year smoking history. She has never used smokeless tobacco. Social: [name]  reports that she quit smoking about 21 years ago. Her smoking use included cigarettes. She started smoking about 41 years ago. She has a 40 pack-year smoking history. She has never used smokeless tobacco. She reports that she does not currently use alcohol. She reports that she does not use drugs. Medical:  has a past medical history of Allergy (1980), Anxiety (03/14/2024), Chronic kidney disease (03/01/2021), Chronic pain, Chronic sinusitis, COVID-19, Depression (03/14/2004), Heart murmur, Hiatal hernia, HPV (human papilloma virus) anogenital infection, Macromastia, Migraine, Psoriatic arthritis (HCC), Shingles, and UTI (urinary tract infection). Surgical: [name]  has a past surgical history that includes Cholecystectomy (2009); Tubal ligation (1997); Laparoscopic gastric banding (2008); Gastric bypass; Breast biopsy (Right, 01/28/2018); Colonoscopy with  propofol  (N/A, 07/19/2018); Esophagogastroduodenoscopy (egd) with propofol  (N/A, 07/19/2018); Breast reduction surgery (Bilateral, 04/03/2020); Colonoscopy with propofol  (N/A, 09/17/2021); Colonoscopy with propofol  (N/A, 09/18/2021); Reduction mammaplasty; Total hip arthroplasty (Left, 01/23/2023); and Joint replacement (01/28/2023). Family: family history includes HIV in her father; Hepatitis in her maternal uncle; Hyperlipidemia in her mother; Hypertension in her mother; Lung cancer in her maternal grandfather; Stroke in her mother.  Laboratory Chemistry Profile   Renal Lab Results  Component Value Date   BUN 21 06/15/2024   CREATININE 1.22 (H) 06/15/2024   BCR NOT APPLICABLE 07/14/2018  GFR 49.78 (L) 06/15/2024   GFRAA >60 02/15/2014   GFRNONAA 51 (L) 05/26/2022    Hepatic Lab Results  Component Value Date   AST 30 03/18/2024   ALT 30 03/18/2024   ALBUMIN 4.3 03/18/2024   ALKPHOS 135 (H) 03/18/2024   AMYLASE 33 09/25/2014   LIPASE 124 10/04/2013    Electrolytes Lab Results  Component Value Date   NA 145 06/15/2024   K 5.1 06/15/2024   CL 110 06/15/2024   CALCIUM 9.1 06/15/2024   MG 1.9 07/14/2018   PHOS 5.0 (H) 09/25/2014    Bone Lab Results  Component Value Date   VD25OH 50.37 06/15/2024    Inflammation (CRP: Acute Phase) (ESR: Chronic Phase) Lab Results  Component Value Date   CRP <1.0 03/10/2019   ESRSEDRATE 3 03/10/2019         Note: Above Lab results reviewed.  Recent Imaging Review  DG Ankle Complete Right EXAM: 3 VIEW(S) XRAY OF THE RIGHT ANKLE 07/05/2024 02:22:46 PM  CLINICAL HISTORY: pain  COMPARISON: None available.  FINDINGS:  BONES AND JOINTS: Small inferior calcaneal spur. No acute fracture. No joint dislocation.  SOFT TISSUES: The soft tissues are unremarkable.  IMPRESSION: 1. No acute osseous abnormality. 2. Small plantar calcaneal spur.  Electronically signed by: Donnice Mania MD 07/10/2024 08:58 PM EST RP Workstation:  [id number] DG Ankle Complete Left EXAM: 3 OR MORE VIEW(S) XRAY OF THE LEFT ANKLE 07/05/2024 02:22:46 PM  CLINICAL HISTORY: foot pain  COMPARISON: None available.  FINDINGS:  BONES AND JOINTS: No acute fracture. No focal osseous lesion. No joint dislocation.  SOFT TISSUES: The soft tissues are unremarkable.  IMPRESSION: 1. No acute osseous abnormality.  Electronically signed by: Donnice Mania MD 07/10/2024 08:56 PM EST RP Workstation: [id number] DG Foot Complete Right EXAM: 3 OR MORE VIEW(S) XRAY OF THE RIGHT FOOT 07/05/2024 02:22:46 PM  COMPARISON: None available.  CLINICAL HISTORY: foot pain  FINDINGS:  BONES AND JOINTS: Small plantar calcaneal spur. No acute fracture. No joint dislocation.  SOFT TISSUES: The soft tissues are unremarkable.  IMPRESSION: 1. No acute findings. 2. Small plantar calcaneal spur.  Electronically signed by: Donnice Mania MD 07/10/2024 08:55 PM EST RP Workstation: [id number] DG Foot Complete Left EXAM: 3 OR MORE VIEW(S) XRAY OF THE LEFT FOOT 07/05/2024 02:22:46 PM  COMPARISON: None available.  CLINICAL HISTORY: foot pain  FINDINGS:  BONES AND JOINTS: No acute fracture. No focal osseous lesion. No joint dislocation.  SOFT TISSUES: The soft tissues are unremarkable.  IMPRESSION: 1. No acute osseous abnormality.  Electronically signed by: Donnice Mania MD 07/10/2024 08:54 PM EST RP Workstation: [id number] Note: Reviewed        Physical Exam  Vitals: BP 119/83   Pulse 96   Temp (!) 97.2 F (36.2 C)   Resp 18   Ht 5' 3 (1.6 m)   Wt 160 lb (72.6 kg)   LMP 04/02/2014   SpO2 99%   BMI 28.34 kg/m  BMI: Estimated body mass index is 28.34 kg/m as calculated from the following:   Height as of this encounter: 5' 3 (1.6 m).   Weight as of this encounter: 160 lb (72.6 kg). Ideal: Ideal body weight: 52.4 kg (115 lb 8.3 oz) Adjusted ideal body weight: 60.5 kg (133 lb 5 oz) General appearance: Well nourished, well  developed, and well hydrated. In no apparent acute distress Mental status: Alert, oriented x 3 (person, place, & time)       Respiratory: No evidence of acute respiratory distress Eyes:  PERLA  Musculoskeletal: Hip Pain (left) and Knee Pain (left) Right ankle pain Bilateral foot pain Assessment   Diagnosis Status  1. Plantar fasciitis of right foot   2. Medication management   3. Chronic pain syndrome   4. Primary osteoarthritis of left hip   5. Chronic musculoskeletal pain   6. Neural foraminal stenosis of lumbar spine   7. Bilateral foot pain   8. Lumbar pain with radiation down left leg   9. Fibromyalgia   10. Primary osteoarthritis of left knee   11. Lumbar radicular pain   12. Myofascial pain syndrome   13. Psoriatic arthritis (HCC)    Controlled Controlled Controlled   Updated Problems: Problem  Medication Management    Plan of Care  Problem-specific:  Assessment and Plan    Chronic bilateral foot pain with plantar fasciitis and calcaneal spur of right foot Chronic bilateral foot pain with plantar fasciitis and a small plantar calcaneal spur on the right foot. Differential diagnosis includes neuropathy, lumbar radiculopathy, or vitamin deficiency-related neuropathy. Previous cortisone injections provided temporary relief. - Referred for nerve conduction study of lower extremity to evaluate for neuropathy vs lumbar radiculopathy. - Advised use of a frozen water  bottle or a ball for foot massage to alleviate pain. - Discussed potential for steroid injection if conservative measures fail.  Primary osteoarthritis of left hip and left knee Chronic pain in the left hip and knee, exacerbated by cold weather and prolonged activity. Declined prednisone  taper at this time. - Continue current pain management regimen with tramadol  and gabapentin . - Will consider prednisone  taper if pain becomes unbearable.  Medication management for chronic pain Current medications include  Percocet, tramadol , gabapentin , and Flexeril . No reported side effects from medications. Flexeril  was previously discontinued, leading to leg cramps. - Sent prescription for Flexeril  to pharmacy. - Advised to contact pharmacy 3-4 days in advance to ensure medication availability. - Continue current pain management regimen.  Patient's pain is controlled with Percocet and tramadol , will continue on current medication regimen.  Prescribing drug monitoring (PDMP) reviewed; findings consistent with the use of prescribed medication no evidence of narcotic misuse or abuse.  Urine drug screening (UDS) up to date.  No side effects or adverse reaction reported to medication.  Schedule follow-up in 90 days for medication management..      [name] has a current medication list which includes the following long-term medication(s): quetiapine and gabapentin .  Pharmacotherapy (Medications Ordered): Meds ordered this encounter  Medications   oxyCODONE -acetaminophen  (PERCOCET) 5-325 MG tablet    Sig: Take 1 tablet by mouth every 8 (eight) hours as needed for severe pain (pain score 7-10). Must last 30 days.    Dispense:  90 tablet    Refill:  0    Chronic Pain: STOP Act (Not applicable) Fill 1 day early if closed on refill date. Avoid benzodiazepines within 8 hours of opioids   oxyCODONE -acetaminophen  (PERCOCET) 5-325 MG tablet    Sig: Take 1 tablet by mouth every 8 (eight) hours as needed for severe pain (pain score 7-10). Must last 30 days.    Dispense:  90 tablet    Refill:  0    Chronic Pain: STOP Act (Not applicable) Fill 1 day early if closed on refill date. Avoid benzodiazepines within 8 hours of opioids   traMADol  (ULTRAM -ER) 200 MG 24 hr tablet    Sig: Take 1 tablet (200 mg total) by mouth daily.    Dispense:  30 tablet    Refill:  2   oxyCODONE -acetaminophen  (PERCOCET) 5-325 MG tablet    Sig: Take 1 tablet by mouth every 8 (eight) hours as needed for severe pain (pain score 7-10). Must  last 30 days.    Dispense:  90 tablet    Refill:  0    Chronic Pain: STOP Act (Not applicable) Fill 1 day early if closed on refill date. Avoid benzodiazepines within 8 hours of opioids   gabapentin  (NEURONTIN ) 600 MG tablet    Sig: Take 1 tablet (600 mg total) by mouth at bedtime.    Dispense:  90 tablet    Refill:  1    Fill one day early if pharmacy is closed on scheduled refill date. May substitute for generic if available.   cyclobenzaprine  (FLEXERIL ) 5 MG tablet    Sig: Take 1 tablet (5 mg total) by mouth at bedtime as needed for muscle spasms.    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 (EMG & PNCV)    Where should this test be performed?:   other    Location on body:   Lower extremity    Laterality:   Bilateral             Always perform bilateral testing for comparison purposes.    Clinical Indication:   Chronic lower extremity pain, numbness, and/or weakness possibly associated with neuropathy/radiculopathy    Release to patient:   Immediate       Return in about 3 months (around 12/26/2024) for (F2F), (MM), Emmy Blanch NP.    Recent Visits Date Type Provider Dept  07/05/24 Office Visit Marcelino Nurse, MD [facility]  Showing recent visits within past 90 days and meeting all other requirements Today's Visits Date Type Provider Dept  09/27/24 Office Visit Draper Gallon K, NP [facility]  Showing today's visits and meeting all other requirements Future Appointments Date Type Provider Dept  12/26/24 Appointment Tawania Daponte K, NP [facility]  Showing future appointments within next 90 days and meeting all other requirements  I discussed the assessment and treatment plan with the patient. The patient was provided an opportunity to ask questions and all were answered. The patient agreed with the plan and demonstrated an understanding of the instructions.  Patient advised to call back or seek an in-person evaluation if the symptoms or condition worsens.  I personally spent a total of 30 minutes in the care of the patient today including preparing to see the patient, getting/reviewing separately obtained history, performing a medically appropriate exam/evaluation, counseling and educating, placing orders, referring and communicating with other health care professionals, documenting clinical information in the EHR, independently interpreting results, communicating results, and coordinating care.   Note by: Rylin Saez K Shakeria Robinette, NP (TTS and AI technology used. I apologize for any typographical errors that were not detected and corrected.) Date: 09/27/2024; Time: 9:03 AM

## 2024-09-27 NOTE — Patient Instructions (Signed)

## 2024-10-20 ENCOUNTER — Encounter: Admitting: Dietician

## 2024-12-09 ENCOUNTER — Ambulatory Visit: Admitting: Endocrinology

## 2024-12-15 ENCOUNTER — Ambulatory Visit: Admitting: "Endocrinology

## 2024-12-26 ENCOUNTER — Encounter: Admitting: Nurse Practitioner
